# Patient Record
Sex: Female | Born: 1950 | Race: Black or African American | Hispanic: No | Smoking: Never smoker
Health system: Southern US, Community
[De-identification: ages and names within clinical notes are randomized; demographics above are authoritative.]

## PROBLEM LIST (undated history)

## (undated) DIAGNOSIS — T8859XA Other complications of anesthesia, initial encounter: Secondary | ICD-10-CM

## (undated) DIAGNOSIS — T7840XA Allergy, unspecified, initial encounter: Secondary | ICD-10-CM

## (undated) DIAGNOSIS — I10 Essential (primary) hypertension: Secondary | ICD-10-CM

## (undated) DIAGNOSIS — Z8711 Personal history of peptic ulcer disease: Secondary | ICD-10-CM

## (undated) DIAGNOSIS — G709 Myoneural disorder, unspecified: Secondary | ICD-10-CM

## (undated) DIAGNOSIS — I509 Heart failure, unspecified: Secondary | ICD-10-CM

## (undated) DIAGNOSIS — J449 Chronic obstructive pulmonary disease, unspecified: Secondary | ICD-10-CM

## (undated) DIAGNOSIS — I341 Nonrheumatic mitral (valve) prolapse: Secondary | ICD-10-CM

## (undated) DIAGNOSIS — E079 Disorder of thyroid, unspecified: Secondary | ICD-10-CM

## (undated) DIAGNOSIS — M1990 Unspecified osteoarthritis, unspecified site: Secondary | ICD-10-CM

## (undated) DIAGNOSIS — Z8679 Personal history of other diseases of the circulatory system: Secondary | ICD-10-CM

## (undated) DIAGNOSIS — J45909 Unspecified asthma, uncomplicated: Secondary | ICD-10-CM

## (undated) DIAGNOSIS — I639 Cerebral infarction, unspecified: Secondary | ICD-10-CM

## (undated) DIAGNOSIS — I671 Cerebral aneurysm, nonruptured: Secondary | ICD-10-CM

## (undated) DIAGNOSIS — T4145XA Adverse effect of unspecified anesthetic, initial encounter: Secondary | ICD-10-CM

## (undated) DIAGNOSIS — F32A Depression, unspecified: Secondary | ICD-10-CM

## (undated) DIAGNOSIS — M75102 Unspecified rotator cuff tear or rupture of left shoulder, not specified as traumatic: Secondary | ICD-10-CM

## (undated) DIAGNOSIS — F419 Anxiety disorder, unspecified: Secondary | ICD-10-CM

## (undated) DIAGNOSIS — H269 Unspecified cataract: Secondary | ICD-10-CM

## (undated) DIAGNOSIS — Z8719 Personal history of other diseases of the digestive system: Secondary | ICD-10-CM

## (undated) DIAGNOSIS — K219 Gastro-esophageal reflux disease without esophagitis: Secondary | ICD-10-CM

## (undated) DIAGNOSIS — E785 Hyperlipidemia, unspecified: Secondary | ICD-10-CM

## (undated) HISTORY — PX: TYMPANOPLASTY: SHX33

## (undated) HISTORY — DX: Gastro-esophageal reflux disease without esophagitis: K21.9

## (undated) HISTORY — DX: Disorder of thyroid, unspecified: E07.9

## (undated) HISTORY — DX: Allergy, unspecified, initial encounter: T78.40XA

## (undated) HISTORY — DX: Chronic obstructive pulmonary disease, unspecified: J44.9

## (undated) HISTORY — DX: Hyperlipidemia, unspecified: E78.5

## (undated) HISTORY — DX: Unspecified osteoarthritis, unspecified site: M19.90

## (undated) HISTORY — DX: Cerebral infarction, unspecified: I63.9

## (undated) HISTORY — PX: TOTAL KNEE ARTHROPLASTY: SHX125

## (undated) HISTORY — DX: Unspecified cataract: H26.9

## (undated) HISTORY — PX: JOINT REPLACEMENT: SHX530

## (undated) HISTORY — DX: Nonrheumatic mitral (valve) prolapse: I34.1

## (undated) HISTORY — DX: Essential (primary) hypertension: I10

## (undated) HISTORY — DX: Depression, unspecified: F32.A

## (undated) HISTORY — PX: FRACTURE SURGERY: SHX138

## (undated) HISTORY — DX: Anxiety disorder, unspecified: F41.9

## (undated) HISTORY — DX: Heart failure, unspecified: I50.9

## (undated) HISTORY — DX: Myoneural disorder, unspecified: G70.9

---

## 1974-02-08 HISTORY — PX: TUBAL LIGATION: SHX77

## 1985-02-08 HISTORY — PX: VAGINAL HYSTERECTOMY: SUR661

## 1995-02-09 HISTORY — PX: OTHER SURGICAL HISTORY: SHX169

## 1997-08-30 ENCOUNTER — Inpatient Hospital Stay (HOSPITAL_COMMUNITY): Admission: EM | Admit: 1997-08-30 | Discharge: 1997-09-01 | Payer: Self-pay | Admitting: Internal Medicine

## 1999-06-17 ENCOUNTER — Other Ambulatory Visit: Admission: RE | Admit: 1999-06-17 | Discharge: 1999-06-17 | Payer: Self-pay | Admitting: Obstetrics and Gynecology

## 1999-07-08 ENCOUNTER — Ambulatory Visit (HOSPITAL_COMMUNITY): Admission: RE | Admit: 1999-07-08 | Discharge: 1999-07-08 | Payer: Self-pay | Admitting: *Deleted

## 1999-07-10 ENCOUNTER — Encounter: Payer: Self-pay | Admitting: Gastroenterology

## 1999-07-10 ENCOUNTER — Ambulatory Visit (HOSPITAL_COMMUNITY): Admission: RE | Admit: 1999-07-10 | Discharge: 1999-07-10 | Payer: Self-pay | Admitting: Gastroenterology

## 2001-05-12 ENCOUNTER — Emergency Department (HOSPITAL_COMMUNITY): Admission: EM | Admit: 2001-05-12 | Discharge: 2001-05-12 | Payer: Self-pay | Admitting: Emergency Medicine

## 2001-05-14 ENCOUNTER — Emergency Department (HOSPITAL_COMMUNITY): Admission: EM | Admit: 2001-05-14 | Discharge: 2001-05-15 | Payer: Self-pay | Admitting: Emergency Medicine

## 2002-09-12 ENCOUNTER — Emergency Department (HOSPITAL_COMMUNITY): Admission: EM | Admit: 2002-09-12 | Discharge: 2002-09-13 | Payer: Self-pay | Admitting: Emergency Medicine

## 2003-03-18 ENCOUNTER — Emergency Department (HOSPITAL_COMMUNITY): Admission: EM | Admit: 2003-03-18 | Discharge: 2003-03-18 | Payer: Self-pay | Admitting: Emergency Medicine

## 2003-03-22 ENCOUNTER — Encounter: Admission: RE | Admit: 2003-03-22 | Discharge: 2003-03-22 | Payer: Self-pay | Admitting: Internal Medicine

## 2004-02-13 ENCOUNTER — Encounter: Admission: RE | Admit: 2004-02-13 | Discharge: 2004-02-13 | Payer: Self-pay | Admitting: Family Medicine

## 2004-02-19 ENCOUNTER — Encounter (HOSPITAL_BASED_OUTPATIENT_CLINIC_OR_DEPARTMENT_OTHER): Admission: RE | Admit: 2004-02-19 | Discharge: 2004-02-24 | Payer: Self-pay | Admitting: Internal Medicine

## 2004-08-27 ENCOUNTER — Emergency Department (HOSPITAL_COMMUNITY): Admission: EM | Admit: 2004-08-27 | Discharge: 2004-08-28 | Payer: Self-pay | Admitting: Emergency Medicine

## 2004-11-27 ENCOUNTER — Emergency Department (HOSPITAL_COMMUNITY): Admission: EM | Admit: 2004-11-27 | Discharge: 2004-11-28 | Payer: Self-pay | Admitting: Emergency Medicine

## 2004-12-17 ENCOUNTER — Encounter: Admission: RE | Admit: 2004-12-17 | Discharge: 2005-02-22 | Payer: Self-pay | Admitting: Orthopaedic Surgery

## 2005-02-08 HISTORY — PX: BUNIONECTOMY: SHX129

## 2005-03-19 ENCOUNTER — Emergency Department (HOSPITAL_COMMUNITY): Admission: EM | Admit: 2005-03-19 | Discharge: 2005-03-19 | Payer: Self-pay | Admitting: Emergency Medicine

## 2005-03-24 ENCOUNTER — Emergency Department (HOSPITAL_COMMUNITY): Admission: EM | Admit: 2005-03-24 | Discharge: 2005-03-24 | Payer: Self-pay | Admitting: *Deleted

## 2005-09-27 ENCOUNTER — Emergency Department (HOSPITAL_COMMUNITY): Admission: EM | Admit: 2005-09-27 | Discharge: 2005-09-28 | Payer: Self-pay | Admitting: Emergency Medicine

## 2006-06-03 ENCOUNTER — Emergency Department (HOSPITAL_COMMUNITY): Admission: EM | Admit: 2006-06-03 | Discharge: 2006-06-03 | Payer: Self-pay | Admitting: Emergency Medicine

## 2006-07-08 ENCOUNTER — Ambulatory Visit (HOSPITAL_COMMUNITY): Admission: RE | Admit: 2006-07-08 | Discharge: 2006-07-08 | Payer: Self-pay | Admitting: Family Medicine

## 2006-09-12 ENCOUNTER — Inpatient Hospital Stay (HOSPITAL_COMMUNITY): Admission: RE | Admit: 2006-09-12 | Discharge: 2006-09-15 | Payer: Self-pay | Admitting: Orthopedic Surgery

## 2007-11-25 ENCOUNTER — Emergency Department (HOSPITAL_COMMUNITY): Admission: EM | Admit: 2007-11-25 | Discharge: 2007-11-25 | Payer: Self-pay | Admitting: Emergency Medicine

## 2007-11-25 ENCOUNTER — Encounter (INDEPENDENT_AMBULATORY_CARE_PROVIDER_SITE_OTHER): Payer: Self-pay | Admitting: Emergency Medicine

## 2007-11-26 ENCOUNTER — Ambulatory Visit: Payer: Self-pay | Admitting: Vascular Surgery

## 2008-07-07 ENCOUNTER — Emergency Department (HOSPITAL_COMMUNITY): Admission: EM | Admit: 2008-07-07 | Discharge: 2008-07-07 | Payer: Self-pay | Admitting: Emergency Medicine

## 2009-03-14 ENCOUNTER — Emergency Department (HOSPITAL_COMMUNITY): Admission: EM | Admit: 2009-03-14 | Discharge: 2009-03-14 | Payer: Self-pay | Admitting: Emergency Medicine

## 2009-11-01 ENCOUNTER — Emergency Department (HOSPITAL_COMMUNITY)
Admission: EM | Admit: 2009-11-01 | Discharge: 2009-11-01 | Payer: Self-pay | Source: Home / Self Care | Admitting: Emergency Medicine

## 2009-11-07 ENCOUNTER — Encounter: Admission: RE | Admit: 2009-11-07 | Discharge: 2009-11-07 | Payer: Self-pay | Admitting: Orthopedic Surgery

## 2010-03-01 ENCOUNTER — Encounter: Payer: Self-pay | Admitting: Family Medicine

## 2010-04-23 LAB — POCT CARDIAC MARKERS
CKMB, poc: <1 ng/mL — ABNORMAL LOW (ref 1.0–8.0)
Myoglobin, poc: 54.5 ng/mL (ref 12–200)
Troponin i, poc: <0.05 ng/mL (ref 0.00–0.09)

## 2010-04-23 LAB — URINALYSIS, ROUTINE W REFLEX MICROSCOPIC
Bilirubin Urine: NEGATIVE
Glucose, UA: >1000 mg/dL — AB
Hgb urine dipstick: NEGATIVE
Ketones, ur: NEGATIVE mg/dL
Leukocytes, UA: NEGATIVE
Nitrite: NEGATIVE
Protein, ur: NEGATIVE mg/dL
Specific Gravity, Urine: 1.029 (ref 1.005–1.030)
Urobilinogen, UA: 0.2 mg/dL (ref 0.0–1.0)
pH: 5.5 (ref 5.0–8.0)

## 2010-04-23 LAB — POCT I-STAT, CHEM 8
BUN: 12 mg/dL (ref 6–23)
Calcium, Ion: 1.16 mmol/L (ref 1.12–1.32)
Chloride: 104 mEq/L (ref 96–112)
Creatinine, Ser: 0.7 mg/dL (ref 0.4–1.2)
Glucose, Bld: 246 mg/dL — ABNORMAL HIGH (ref 70–99)
HCT: 46 % (ref 36.0–46.0)
Hemoglobin: 15.6 g/dL — ABNORMAL HIGH (ref 12.0–15.0)
Potassium: 3.8 mEq/L (ref 3.5–5.1)
Sodium: 141 mEq/L (ref 135–145)
TCO2: 29 mmol/L (ref 0–100)

## 2010-04-23 LAB — URINE CULTURE
Colony Count: 7000
Culture  Setup Time: 201109251511

## 2010-04-23 LAB — URINE MICROSCOPIC-ADD ON

## 2010-05-19 LAB — CBC
HCT: 42.4 % (ref 36.0–46.0)
Hemoglobin: 14.1 g/dL (ref 12.0–15.0)
MCHC: 33.4 g/dL (ref 30.0–36.0)
MCV: 86.5 fL (ref 78.0–100.0)
Platelets: 248 10*3/uL (ref 150–400)
RBC: 4.9 MIL/uL (ref 3.87–5.11)
RDW: 13.4 % (ref 11.5–15.5)
WBC: 6.9 10*3/uL (ref 4.0–10.5)

## 2010-05-19 LAB — BASIC METABOLIC PANEL
BUN: 16 mg/dL (ref 6–23)
CO2: 29 mEq/L (ref 19–32)
Calcium: 9.4 mg/dL (ref 8.4–10.5)
Chloride: 104 mEq/L (ref 96–112)
Creatinine, Ser: 0.49 mg/dL (ref 0.4–1.2)
GFR calc Af Amer: >60 mL/min (ref >60)
GFR calc non Af Amer: >60 mL/min (ref >60)
Glucose, Bld: 164 mg/dL — ABNORMAL HIGH (ref 70–99)
Potassium: 3.7 mEq/L (ref 3.5–5.1)
Sodium: 141 mEq/L (ref 135–145)

## 2010-05-19 LAB — URINALYSIS, ROUTINE W REFLEX MICROSCOPIC
Bilirubin Urine: NEGATIVE
Glucose, UA: 100 mg/dL — AB
Hgb urine dipstick: NEGATIVE
Ketones, ur: NEGATIVE mg/dL
Nitrite: NEGATIVE
Protein, ur: NEGATIVE mg/dL
Specific Gravity, Urine: 1.023 (ref 1.005–1.030)
Urobilinogen, UA: 0.2 mg/dL (ref 0.0–1.0)
pH: 6 (ref 5.0–8.0)

## 2010-05-19 LAB — CK TOTAL AND CKMB (NOT AT ARMC)
CK, MB: 3 ng/mL (ref 0.3–4.0)
Relative Index: 2 (ref 0.0–2.5)
Total CK: 153 U/L (ref 7–177)

## 2010-05-19 LAB — DIFFERENTIAL
Basophils Absolute: 0 10*3/uL (ref 0.0–0.1)
Basophils Relative: 1 % (ref 0–1)
Eosinophils Absolute: 0.2 10*3/uL (ref 0.0–0.7)
Eosinophils Relative: 3 % (ref 0–5)
Lymphocytes Relative: 43 % (ref 12–46)
Lymphs Abs: 2.9 10*3/uL (ref 0.7–4.0)
Monocytes Absolute: 0.4 10*3/uL (ref 0.1–1.0)
Monocytes Relative: 6 % (ref 3–12)
Neutro Abs: 3.3 10*3/uL (ref 1.7–7.7)
Neutrophils Relative %: 48 % (ref 43–77)

## 2010-05-19 LAB — TROPONIN I: Troponin I: 0.01 ng/mL (ref 0.00–0.06)

## 2010-05-19 LAB — BRAIN NATRIURETIC PEPTIDE: Pro B Natriuretic peptide (BNP): <30 pg/mL (ref 0.0–100.0)

## 2010-06-23 NOTE — Op Note (Signed)
NAME:  Sherry [name], Sherry [name]            ACCOUNT NO.:  [id number]   MEDICAL RECORD NO.:  [id number]          PATIENT TYPE:  INP   LOCATION:  0001                         FACILITY:  [facility]   PHYSICIAN:  Madlyn Frankel. Charlann Boxer, M.D.  DATE OF BIRTH:  11-25-1950   DATE OF PROCEDURE:  09/12/2006  DATE OF DISCHARGE:                               OPERATIVE REPORT   PREOPERATIVE DIAGNOSIS:  Right knee osteoarthritis.   POSTOPERATIVE DIAGNOSIS:  Right knee osteoarthritis.   PROCEDURE:  Right total knee replacement.   COMPONENTS USED:  DePuy rotating platform knee system with size 2 femur,  2 tibia, 12.5-mm insert, 35 patellar button.   SURGEON:  Madlyn Frankel. Charlann Boxer, M.D.   ASSISTANT:  Dwyane Luo, PA-C   ANESTHESIA:  General.   BLOOD LOSS:  Minimal.   TOURNIQUET TIME:  Femoral 42 minutes at 300 mmHg.   DRAINS:  None.   COMPLICATIONS:  None.   INDICATIONS FOR PROCEDURE:  Sherry [name] is a 60 year old female who  presented to the office for evaluation of right knee pain.  At the time  of presentation she was already on narcotics and had persistent  discomfort.  I reviewed with her that based on narcotic use there was  probably very little nonoperative intervention that could help, that she  had failed conservative measures.  Consent was obtained after reviewing  risks of infection, DVT, dislocation of the component, component  failure, stiffness, postoperative course were all reviewed including the  extensive therapy that was required including the pain that was  associated with it.   PROCEDURE IN DETAIL:  The patient was brought into the operative  theater.  Once adequate anesthesia, preoperative antibiotics, Ancef were  administered, the patient was positioned supine.  Attempted spinal  anesthesia was unsuccessful and therefore went to sleep.  They did not  feel femoral nerve block would be of benefit as well.  The right lower  extremity was placed into a proximal thigh tourniquet, prescrubbed  and  prepped and draped in sterile fashion.   A midline incision was made.  The patient's soft tissue was noted to be  very fibrotic.  She is a diabetic patient.  Standard incision was made  followed by modified medial parapatellar arthrotomy with patella  subluxation rather than eversion.  Attention to debridement initially in  the joint including synovectomy was carried out.  Attention was first  directed to the patella.  A precut measurement was at the 22 mm.  I  resected down to 13 mm and placed a 35 patellar button.   Attention was then directed to the femur.  Femoral canal was opened,  irrigated to prevent fat emboli, then using an intramedullary device at  5 degrees of valgus I resected 10 mm off the distal side.  After  removing osteophytes off the distal medial tibia/femur.  I then sized  the femur to be a size 2.  I placed the cutting jig which was  perpendicular to [employer] and referenced off of the posterior  condylar axis.   The anterior, posterior and chamfer cuts were then made without notching  or complication.  I then made the trochlear box cut based on the lateral  aspect of distal femur.  This was done without complication despite  being a size 2 femur.  Attention was then directed the tibia.  I  resected 10 mm of bone off the lateral side.   Meniscectomies and cruciate stumps debrided.  I sized the tibia to be a  size 2.  I did check with a spacer block and found the knee came out to  extension with a 10-mm block, probably requiring a 12.5.  I went ahead  and set the tibial rotation pendant, checked with an alignment rod and  was happy that in both the coronal and sagittal planes of the tibia the  alignment rod passed through the ankle.   At this point it was drilled and keel punch.  Trial reduction was  carried out with 2 femur, 2 tibia, 12.5 insert.  The knee came out to  full extension with a 35 patellar button in place.  The patella tracked  without  application of any pressure and the knee was stable from  extension into flexion.  At this point all trial components were  removed.  The knee was irrigated with normal saline solution.  Final  components were opened.  The knee was injected 60 mL of 0.25% Marcaine  with epinephrine.  Once the components were ready, the cement was mixed  and the components were cemented into position.  The knee was brought to  extension with a 12.5 trial insert.  Extruded cement was removed.  Once  the cement had cured, I brought the knee to flexion and removed  excessive cement.  Once I was satisfied there was no visualized cement  throughout the knee, the final 12.5 x 2 insert was placed.   The knee was again irrigated with 5 mL.  FloSeal was then injected the  medial and lateral gutters.  The knee was then brought to flexion  without a Hemovac placed, then the extensor mechanism reapproximated  using #1 Vicryl.  The remainder of the wound was closed with 2-0 Vicryl  and a running 4-0 Monocryl.  The patient's knee was then cleaned, dried  and dressed sterilely with Steri-Strips and bulky sterile dressing.  She  was brought to recovery in stable condition.   She is going to be placed on PCA oxycodone postoperative.      Madlyn Frankel Charlann Boxer, M.D.  Electronically Signed     MDO/MEDQ  D:  09/12/2006  T:  09/12/2006  Job:  [id number]

## 2010-06-23 NOTE — H&P (Signed)
NAME:  RHANDA, LEMIRE            ACCOUNT NO.:  [id number]   MEDICAL RECORD NO.:  [id number]          PATIENT TYPE:  INP   LOCATION:  NA                           FACILITY:  [facility]   PHYSICIAN:  Madlyn Frankel. Charlann Boxer, M.D.  DATE OF BIRTH:  November 04, 1950   DATE OF ADMISSION:  09/12/2006  DATE OF DISCHARGE:                              HISTORY & PHYSICAL   PROCEDURE:  Right total knee arthroplasty.   CHIEF COMPLAINT:  Right knee pain.   HISTORY OF PRESENT ILLNESS:  This is a 60 year old female with a history  of persistent progressive right knee pain secondary to osteoarthritis.  It has been refractory to all conservative treatment.  She has been  presurgically assessed by Dr. Leilani Able.   PAST MEDICAL HISTORY:  Significant for  1. Osteoarthritis.  2. Degenerative disk disease.  3. Hypertension.  4. Asthma.  5. Reflux disease.  6. Fibrocystic breast disease.  7. Diabetes, insulin dependent.   PAST SURGICAL HISTORY:  Includes  1. Tympanoplasty in 1980 and 1990.  2. Partial hysterectomy.  3. Left shoulder repair in 1997.  4. Left bunionectomy in 2007.  Of note, in previous surgeries, anesthesia, some medicine precipitated a  hypotensive crisis of sorts in 1980 or 1990.  The patient unable to  remember.   SOCIAL HISTORY:  The patient is married.  Primary caregiver after  surgery will be husband, Broderick.   FAMILY HISTORY:  Heart disease, hypertension, diabetes, cancer,  arthritis.   DRUG ALLERGIES:  CODEINE causes itching.  DEMEROL causes seizures.  NONSTEROIDAL ANTI-INFLAMMATORY DRUGS can cause palpitations.   MEDICATIONS:  Include  1. Protonix 40 mg p.o. daily.  2. Singulair 10 mg p.o. daily.  3. Colace 100 mg p.o. b.i.d.  4. Oxycodone 5 mg one to two p.o. q.6h.  p.r.n. pain.  5. Unknown blood pressure medicine recently prescribed.  6. Celebrex 200 mg one p.o. b.i.d.   REVIEW OF SYSTEMS:  NEUROLOGY:  Remote history of seizures in 1970.  GASTROINTESTINAL:  Hiatal hernia  and diverticulitis.  GENITOURINARY:  Recurring urinary tract infections, cystitis and some urinary  incontinence.  GYNECOLOGIC:  History of uterine fibroids with  hysterectomy.  Otherwise see HPI.   PHYSICAL EXAMINATION:  VITAL SIGNS:  Pulse 68, respirations 18, blood  pressure 122/84.  GENERAL:  Awake, alert and oriented, well-developed, well-nourished in  no acute distress.  NECK:  Supple.  No carotid bruits.  CHEST/LUNGS:  Clear to auscultation bilaterally.  BREASTS:  Deferred.  HEART:  Regular rate and rhythm without gallops, clicks, rubs or  murmurs.  ABDOMEN:  Soft, nontender, nondistended.  Bowel sounds present in all  four quadrants.  GENITOURINARY:  Deferred.  EXTREMITIES:  Right knee increased pain with movement.  Dorsalis pedis  pulse positive.  SKIN:  No cellulitis.  Does have keloid scars on left foot and is prone  to keloid scars.  NEUROLOGIC:  Intact distal sensibilities.   LABORATORY DATA:  Labs, EKG, chest x-ray all pending.   IMPRESSION:  1. Osteoarthritis.  2. Gastroesophageal reflux disease.  3. Meniere's disease.  4. Remote history of seizures.  5. Asthma.  6. Hypertension.  7. Diabetes.   PLAN OF ACTION:  Right total knee arthroplasty September 12, 2006, [facility], Dr. Durene Romans.  Risks and complications were  discussed.  Questions were encouraged, answered reviewed.   Postoperative medications including Lovenox, Robaxin, iron, aspirin,  Colace and MiraLax provided at time of presurgical history and physical.  Also Celebrex 200 mg tablets were given for perioperative use.  Will  continue to use 200 mg p.o. b.i.d. two days after surgery.     ______________________________  Yetta Glassman [address], [place]      Madlyn Frankel. Charlann Boxer, M.D.  Electronically Signed   BLM/MEDQ  D:  09/05/2006  T:  09/06/2006  Job:  [id number]   cc:   Jocelyn Lamer D. Pecola Leisure, M.D.  Fax: [phone]

## 2010-06-26 NOTE — Consult Note (Signed)
NAMESHARALEE, WITMAN NO.:  [id number]   MEDICAL RECORD NO.:  [id number]          PATIENT TYPE:  REC   LOCATION:  FOOT                         FACILITY:  [facility]   PHYSICIAN:  Jonelle Sports. Sevier, M.D. DATE OF BIRTH:  1950/06/30   DATE OF CONSULTATION:  DATE OF DISCHARGE:                                   CONSULTATION   Audio too short to transcribe (less than 5 seconds)       RES/MEDQ  D:  02/20/2004  T:  02/20/2004  Job:  [id number]

## 2010-06-26 NOTE — Procedures (Signed)
[facility]  Patient:    Sherry [name], SAFRAN                     MRN: [id number] Proc. Date: 07/08/99 Adm. Date:  [id number] Disc. Date: [id number] Attending:  Charna Elizabeth CC:         Lilly Cove, M.D.                           Procedure Report  DATE OF BIRTH:  [birth year].  REFERRING PHYSICIAN:  Lilly Cove, M.D.  PROCEDURE:  Esophagogastroduodenoscopy.  ENDOSCOPIST:  Anselmo Rod, M.D.  INSTRUMENTS:  Olympus video panendoscope.  INDICATION:  Severe epigastric pain and reflux with rectal bleeding in a 60 year old black female.  Rule out peptic ulcer disease, esophagitis, gastritis, etc.  INFORMED CONSENT:  Informed consent was procured from the patient.  PREPARATION:  The patient was fasted for eight hours prior to the procedure.  PHYSICAL EXAMINATION:  VITAL SIGNS:  Stable.  NECK:  Supple.  CHEST:  Clear to auscultation.  HEART:  S1 and S2 regular.  ABDOMEN:  Soft with normal abdominal bowel sounds.  Epigastric tenderness on palpation with guarding.  No rebound or rigidity.  No hepatosplenomegaly.  DESCRIPTION OF PROCEDURE:  The patient was placed in the left lateral decubitus position and sedated with 50 mg of Demerol and 5 mg of Versed intravenously.  Once the patient was adequately sedated, maintained on low flow oxygen, and continuous cardiac monitoring, the Olympus video panendoscope was advanced through the mouthpiece, over the tongue, into the esophagus under direct vision.  The entire esophagus appeared normal without evidence of ring, stricture, mass, lesions, or esophagitis.  The scope was then advanced to the stomach.  There was active reflux of the gastric content into the distal esophagus from the stomach.  A hiatal hernia was seen on retroflexion.  No abnormalities were recognized.  The gastric mucosa appeared healthy.  So did the proximal small bowel.  IMPRESSION:  Small hiatal hernia.  Otherwise  normal esophagogastroduodenoscopy.  RECOMMENDATIONS:  Proceed with colonoscopy at this time.  Continue PPI.  Avoid all nonsteroidals.  Further recommendations will be made once the colonoscopy has been done. DD:  07/08/99 TD:  07/10/99 Job: [id number] UXL/[id number]

## 2010-06-26 NOTE — Consult Note (Signed)
Sherry [name], Sherry [name] NO.:  [id number]   MEDICAL RECORD NO.:  [id number]          PATIENT TYPE:  REC   LOCATION:  FOOT                         FACILITY:  [facility]   PHYSICIAN:  Jonelle Sports. Cheryll Cockayne, M.D. DATE OF BIRTH:  [birth year]   DATE OF CONSULTATION:  02/20/2004  DATE OF DISCHARGE:                                   CONSULTATION   REFERRING PHYSICIAN:  Lavonda Jumbo, M.D.   HISTORY:  This 60 year old black female was seen at the courtesy of Dr.  Lynelle Doctor for evaluation of multiple calluses in conjunction with diabetes.   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  She wears tennis-type shoes at work, but does experience some  foot discomfort and mild swelling, particularly by the end of the day.  She  has noted some callus formation throughout her feet and being aware of the  potential hazards with diabetes is here today for our evaluation and advice.   PAST MEDICAL HISTORY:  1.  Diverticulitis.  2.  Meniere's disease.  3.  Gastrointestinal reflux.  4.  Mitral valve prolapse.  5.  Some arthritis of the back and knees.   ALLERGIES:  She is said to be allergic to CODEINE and also GENERAL  ANESTHESIA which causes major hypotension.   MEDICATIONS:  Her regular medications include Lantus insulin, Hygroton,  Flexeril, Protonix, Nexium, Peri-Colace and Humibid.   PHYSICAL EXAMINATION:  Examination today is limited to the distal lower  extremities.  The feet are without gross deformity, although there is  beginning hallux valgus formation on the right foot and slight overriding of  the second toe on the hallux.  She has minimal clawing of her toes.  There  is some slight loss of the longitudinal arches bilaterally as well.  Skin  temperatures are normal and symmetrical.  All pulses are palpable and quite  adequate.  Monofilament testing shows that she has preservation of  protective sensation throughout both feet.   There  is no evidence of edema at the time of this examination.   The patient has callus formation at the distal interphalangeal joint of the  right second toe where it overrides the right hallux.  She also has  significant callus formation at the interphalangeal joint areas medially of  both halluces, as well as calluses on the plantar aspects of the feet at the  second, third and fourth metatarsal head areas on the right and at the  fourth and fifth metatarsal head areas on the left.   DISPOSITION:  1.  The patient is given instruction regarding foot care and diabetes by      video with nurse and physician reinforcement.  2.  The aforementioned calluses to include that on the medial interdigital      portion of the right second toe and those on the plantar aspects of both      feet are sharply pared without incident.  This paring includes calluses      at the second, third and fourth metatarsal head areas on the right  and      at the fourth and fifth metatarsal areas on the left.  3.  It is discussed with the patient that she might look into getting a      bunion splint to be worn overnight to try to prevent further worsening      of the hallux valgus there.  She is given a __________ pad to wear on      the hallux which will provide some cushioning where the second toe      overrides.  She is advised to get cocoa butter to use on her feet for      the generally dry skin.  4.  The matter of shoe fit is discussed with the patient in that there is      some evidence that her current shoes are too short.  Finally, she is      advised to get gel-type over the counter inserts with some longitudinal      arch buildup as well and to use these to prevent foot fatigue and      further progression of her disease.  5.  She is advised to let us see her again on a six-month basis to assess      for persistent in advance of her foot problems.       RES/MEDQ  D:  02/20/2004  T:  02/20/2004  Job:   [id number]   cc:   Lavonda Jumbo, M.D.  [address] [place], [place] [address]  Fax: [phone]

## 2010-06-26 NOTE — Discharge Summary (Signed)
NAME:  Sherry, [name] NO.:  [id number]   MEDICAL RECORD NO.:  [id number]          PATIENT TYPE:  INP   LOCATION:  1608                         FACILITY:  [facility]   PHYSICIAN:  Madlyn Frankel. Charlann Boxer, M.D.  DATE OF BIRTH:  Mar 14, 1950   DATE OF ADMISSION:  09/12/2006  DATE OF DISCHARGE:  09/15/2006                               DISCHARGE SUMMARY   ADMISSION DIAGNOSES:  1. Osteoporosis.  2. Degenerative disk disease.  3. Hypertension.  4. Asthma.  5. Reflux.  6. Fibrocystic breast disease.  7. Diabetes, insulin dependent.   DISCHARGE DIAGNOSES:  1. Osteoarthritis.  2. Degenerative disk disease.  3,  Hypertension.  1. Asthma.  2. Reflux disease.  3. Fibrocystic breast disease.  4. Diabetes.  5. Postoperative hypokalemia.   CONSULTATIONS:  None.   PROCEDURE:  Right total knee arthroplasty.   SURGEON:  Madlyn Frankel. Charlann Boxer, M.D.   ASSISTANT:  Yetta Glassman. Mann, P.A.   LABORATORY DATA:  Admission CBC:  Hematocrit 39.9, postoperative  interval one 32.6, postoperativeinterval two 30.2, stable.  Coags  negative.  Routine chemistry:  Glucose 171 preoperative, all other  normal.  Postoperative day #1 all within normal limits.  At discharge  mild postoperative hypokalemia 3.3, glucose at 143, BUN at 3.  Kidney  function within normal limits.  Calcium at discharge 8.1.  GI negative.  UA negative.   Cardiology:  EKG:  Normal sinus rhythm.   Radiology shows two view, no active cardiopulmonary disease.   HISTORY OF PRESENT ILLNESS:  This is a 60 year old female with a history  of persistent progressive right knee pain secondary to osteoarthritis,  refractory to conservative treatment.  Presurgically assessed by Dr.  Leilani Able.   HOSPITAL COURSE:  The patient underwent right total knee replacement by  surgeon Dr. Durene Romans.  She tolerated the procedure well and was  admitted to the orthopedic floor.  She remained afebrile throughout her  course.  The patient remained  neurovascularly intact of her right lower  extremity as well.  Pain was well-controlled.  Dressing was changed on  postoperative day #1 and a daily basis with no significant wound  drainage.  Pain was well-controlled PCA.  On postoperative day #1 she  was switched to oral medications afterwards.  Physical therapy was begun  on postoperative day #1.  She progressed nicely during her course of  stay and was able to ambulate at least 50 feet with the use of a rolling  walker, weight bearing as tolerated after surgery.  DVT prophylaxis was  begun as well.  Mild hypokalemia was replaced with K-Dur.  Home health,  physical therapy, as well as Lovenox teaching provided on postoperative  day #3.  The patient was stable and ready for discharge.   DISPOSITION:  Discharged to home with home health care PT.  Stable and  improved condition with weight bearing as tolerated with the use of 3-in-  1 walker.   Discharge physical therapy:  Weight bearing as tolerated with rolling  walker for two weeks and transition to single point cane.  Goals for  physical therapy would be to minimize  pain, maximize strength, and  increase range of motion, and encourage independence in activities of  daily living.  Also work on Water quality scientist.   DIET:  No restrictions.   DISCHARGE MEDICATIONS:  1. Lovenox 30 mg subcu q.12 x11 days.  2. Enteric-coated aspirin 325 mg p.o. daily, four weeks after Lovenox      completed.  3. Robaxin 500 mg p.o. q.6 p.r.n. muscle spasm.  4. Iron 325 mg p.o. t.i.d. x2 weeks.  5. Colace 100 mg p.o. b.i.d.  6. Miralax 17 gm  p.o. daily.  7. Vicodin 5/325 1-2 p.o. q.4-6 p.r.n. pain.  8. Niacin 500 mg p.o. daily.  9. __________ 10/160 mg one p.o. q.a.m.  10.Lantus 45 units q.h.s.  11.Calcium plus D one daily.  12.Senokot two at night.  13.Protonix 40 mg q.a.m.  14.Singulair 10 mg q.a.m.  15.Xanax p.r.n.  16.Prilosec 20 mg p.o. q.a.m.   FOLLOWUP:  Follow with Dr. Charlann Boxer,  [phone], in two weeks for wound care  check.     ______________________________  Yetta Glassman. [address], [place]      Madlyn Frankel. Charlann Boxer, M.D.  Electronically Signed   BLM/MEDQ  D:  10/03/2006  T:  10/04/2006  Job:  [id number]

## 2010-06-26 NOTE — Procedures (Signed)
[facility]  Patient:    Sherry [name], Sherry [name]                     MRN: [id number] Proc. Date: 07/08/99 Adm. Date:  [id number] Disc. Date: [id number] Attending:  Charna Elizabeth CC:         Lilly Cove, M.D.                           Procedure Report  DATE OF BIRTH:  November 13, 1950.  REFERRING PHYSICIAN:  Lilly Cove, M.D.  PROCEDURE:  Colonoscopy.  ENDOSCOPIST:  Anselmo Rod, M.D.  INSTRUMENTS:  Olympus video colonoscope.  INDICATION:  Rectal bleeding in a 60 year old black female.  Rule out polyps, masses, hemorrhoids, etc.  INFORMED CONSENT:  Informed consent was procured from the patient.  PREPARATION:  The patient was fasted for eight hours prior to the procedure and prepped with a bottle of magnesium citrate and a gallon of NuLYTELY the night prior to the procedure.  PHYSICAL EXAMINATION:  VITAL SIGNS:  Stable.  NECK:  Supple.  CHEST:  Clear to auscultation.  HEART:  S1 and S2 regular.  ABDOMEN:  Soft with normal abdominal bowel sounds.  DESCRIPTION OF PROCEDURE:  The patient was placed in the left lateral decubitus position and sedated with an additional 25 mg of Demerol and 1 mg o Versed intravenously.  Once the patient was adequately sedated, maintained on low flow oxygen, and continuous cardiac monitoring, the Olympus video colonoscope was advanced from the rectum to the cecum without difficulty.  Except for small internal hemorrhoids, no other abnormalities were seen.  The patient tolerated the procedure well without complication.  IMPRESSION:  Normal colonoscopy except for small nonbleeding internal hemorrhoids.  RECOMMENDATIONS:  The patient has been advised to increase her fluid and fiber in her diet.  Colace 100-200 mg to be tried at bed time for stool softener. Outpatient follow up is advised in the next two weeks. DD:  07/08/99 TD:  07/10/99 Job: [id number]

## 2010-11-09 LAB — DIFFERENTIAL
Basophils Absolute: 0.1
Basophils Relative: 1
Eosinophils Absolute: 0.3
Eosinophils Relative: 4
Lymphocytes Relative: 39
Lymphs Abs: 3
Monocytes Absolute: 0.3
Monocytes Relative: 3
Neutro Abs: 4.1
Neutrophils Relative %: 53

## 2010-11-09 LAB — BASIC METABOLIC PANEL
BUN: 9
CO2: 29
Calcium: 9
Chloride: 105
Creatinine, Ser: 0.65
GFR calc Af Amer: >60
GFR calc non Af Amer: >60
Glucose, Bld: 196 — ABNORMAL HIGH
Potassium: 3.2 — ABNORMAL LOW
Sodium: 140

## 2010-11-09 LAB — GLUCOSE, CAPILLARY: Glucose-Capillary: 182 — ABNORMAL HIGH

## 2010-11-09 LAB — CBC
HCT: 43.2
Hemoglobin: 14.3
MCHC: 33.1
MCV: 88.1
Platelets: 277
RBC: 4.91
RDW: 14.1
WBC: 7.7

## 2010-11-09 LAB — D-DIMER, QUANTITATIVE: D-Dimer, Quant: 0.99 — ABNORMAL HIGH

## 2010-11-23 LAB — URINALYSIS, ROUTINE W REFLEX MICROSCOPIC
Bilirubin Urine: NEGATIVE
Glucose, UA: NEGATIVE
Hgb urine dipstick: NEGATIVE
Ketones, ur: NEGATIVE
Nitrite: NEGATIVE
Protein, ur: NEGATIVE
Specific Gravity, Urine: 1.021
Urobilinogen, UA: 0.2
pH: 7

## 2010-11-23 LAB — COMPREHENSIVE METABOLIC PANEL
ALT: 16
AST: 15
Albumin: 3.6
Alkaline Phosphatase: 67
BUN: 8
CO2: 30
Calcium: 9.2
Chloride: 105
Creatinine, Ser: 0.57
GFR calc Af Amer: >60
GFR calc non Af Amer: >60
Glucose, Bld: 171 — ABNORMAL HIGH
Potassium: 4
Sodium: 141
Total Bilirubin: 0.9
Total Protein: 6.6

## 2010-11-23 LAB — CBC
HCT: 30.2 — ABNORMAL LOW
HCT: 33.6 — ABNORMAL LOW
HCT: 39.9
Hemoglobin: 10.4 — ABNORMAL LOW
Hemoglobin: 11.7 — ABNORMAL LOW
Hemoglobin: 13.7
MCHC: 34.6
MCHC: 34.7
MCHC: 34.4
MCV: 86.3
MCV: 86.4
MCV: 86.3
Platelets: 207
Platelets: 240
Platelets: 300
RBC: 3.49 — ABNORMAL LOW
RBC: 3.9
RBC: 4.62
RDW: 13.8
RDW: 14
RDW: 14.1 — ABNORMAL HIGH
WBC: 10.2
WBC: 9.8
WBC: 6

## 2010-11-23 LAB — BASIC METABOLIC PANEL
BUN: 3 — ABNORMAL LOW
BUN: 7
CO2: 28
CO2: 29
Calcium: 8.1 — ABNORMAL LOW
Calcium: 8.2 — ABNORMAL LOW
Chloride: 104
Chloride: 106
Creatinine, Ser: 0.54
Creatinine, Ser: 0.54
GFR calc Af Amer: >60
GFR calc Af Amer: >60
GFR calc non Af Amer: >60
GFR calc non Af Amer: >60
Glucose, Bld: 143 — ABNORMAL HIGH
Glucose, Bld: 90
Potassium: 3.3 — ABNORMAL LOW
Potassium: 3.6
Sodium: 138
Sodium: 139

## 2010-11-23 LAB — ABO/RH: ABO/RH(D): O POS

## 2010-11-23 LAB — PROTIME-INR
INR: 1
Prothrombin Time: 13

## 2010-11-23 LAB — TYPE AND SCREEN
ABO/RH(D): O POS
Antibody Screen: NEGATIVE

## 2010-11-23 LAB — APTT: aPTT: 29

## 2011-09-07 ENCOUNTER — Other Ambulatory Visit: Payer: Self-pay | Admitting: Specialist

## 2011-09-14 ENCOUNTER — Encounter (HOSPITAL_BASED_OUTPATIENT_CLINIC_OR_DEPARTMENT_OTHER): Payer: Self-pay | Admitting: *Deleted

## 2011-09-16 ENCOUNTER — Encounter (HOSPITAL_BASED_OUTPATIENT_CLINIC_OR_DEPARTMENT_OTHER): Payer: Self-pay | Admitting: *Deleted

## 2011-09-16 NOTE — Progress Notes (Signed)
NPO AFTER MN WITH EXCEPTION CLEAR LIQUIDS UNTIL 0700 (NO MILK/ CREAM PRODUCTS). ARRIVES AT 1100. NEEDS ISTAT AND EKG. WILL TAKE PRILOSEC AND METOPROLOL AM OF SURG W/ SIP OF WATER.  PT UNDERSTANDS OWER AT MAIN.

## 2011-09-17 NOTE — Progress Notes (Signed)
Spoke with Carollee Herter in Dr Veronda Prude office regarding his pre-op orders-new orders received.

## 2011-09-20 ENCOUNTER — Encounter (HOSPITAL_BASED_OUTPATIENT_CLINIC_OR_DEPARTMENT_OTHER): Payer: Self-pay | Admitting: Anesthesiology

## 2011-09-20 ENCOUNTER — Encounter (HOSPITAL_BASED_OUTPATIENT_CLINIC_OR_DEPARTMENT_OTHER): Payer: Self-pay | Admitting: *Deleted

## 2011-09-20 ENCOUNTER — Ambulatory Visit (HOSPITAL_BASED_OUTPATIENT_CLINIC_OR_DEPARTMENT_OTHER): Payer: Worker's Compensation | Admitting: Anesthesiology

## 2011-09-20 ENCOUNTER — Encounter (HOSPITAL_COMMUNITY)
Admission: RE | Disposition: A | Discharge status: Home / Self Care | Payer: Self-pay | Source: Ambulatory Visit | Attending: Specialist

## 2011-09-20 ENCOUNTER — Ambulatory Visit (HOSPITAL_BASED_OUTPATIENT_CLINIC_OR_DEPARTMENT_OTHER)
Admission: RE | Admit: 2011-09-20 | Discharge: 2011-09-21 | Disposition: A | Discharge status: Home / Self Care | Payer: Worker's Compensation | Source: Ambulatory Visit | Attending: Specialist | Admitting: Specialist

## 2011-09-20 DIAGNOSIS — S43429A Sprain of unspecified rotator cuff capsule, initial encounter: Secondary | ICD-10-CM | POA: Insufficient documentation

## 2011-09-20 DIAGNOSIS — I10 Essential (primary) hypertension: Secondary | ICD-10-CM | POA: Insufficient documentation

## 2011-09-20 DIAGNOSIS — E785 Hyperlipidemia, unspecified: Secondary | ICD-10-CM | POA: Insufficient documentation

## 2011-09-20 DIAGNOSIS — E119 Type 2 diabetes mellitus without complications: Secondary | ICD-10-CM | POA: Insufficient documentation

## 2011-09-20 DIAGNOSIS — K219 Gastro-esophageal reflux disease without esophagitis: Secondary | ICD-10-CM | POA: Insufficient documentation

## 2011-09-20 DIAGNOSIS — M25819 Other specified joint disorders, unspecified shoulder: Secondary | ICD-10-CM | POA: Insufficient documentation

## 2011-09-20 DIAGNOSIS — M75100 Unspecified rotator cuff tear or rupture of unspecified shoulder, not specified as traumatic: Secondary | ICD-10-CM

## 2011-09-20 DIAGNOSIS — Z79899 Other long term (current) drug therapy: Secondary | ICD-10-CM | POA: Insufficient documentation

## 2011-09-20 DIAGNOSIS — X58XXXA Exposure to other specified factors, initial encounter: Secondary | ICD-10-CM | POA: Insufficient documentation

## 2011-09-20 DIAGNOSIS — Z794 Long term (current) use of insulin: Secondary | ICD-10-CM | POA: Insufficient documentation

## 2011-09-20 HISTORY — DX: Unspecified osteoarthritis, unspecified site: M19.90

## 2011-09-20 HISTORY — DX: Personal history of other diseases of the digestive system: Z87.19

## 2011-09-20 HISTORY — PX: SHOULDER ARTHROSCOPY: SHX128

## 2011-09-20 HISTORY — DX: Unspecified rotator cuff tear or rupture of left shoulder, not specified as traumatic: M75.102

## 2011-09-20 HISTORY — DX: Other complications of anesthesia, initial encounter: T88.59XA

## 2011-09-20 HISTORY — DX: Personal history of other diseases of the circulatory system: Z86.79

## 2011-09-20 HISTORY — DX: Personal history of peptic ulcer disease: Z87.11

## 2011-09-20 HISTORY — DX: Adverse effect of unspecified anesthetic, initial encounter: T41.45XA

## 2011-09-20 LAB — POCT I-STAT, CHEM 8
BUN: 14 mg/dL (ref 6–23)
Calcium, Ion: 1.24 mmol/L (ref 1.13–1.30)
Chloride: 104 mEq/L (ref 96–112)
Creatinine, Ser: 0.8 mg/dL (ref 0.50–1.10)
Glucose, Bld: 68 mg/dL — ABNORMAL LOW (ref 70–99)
HCT: 43 % (ref 36.0–46.0)
Hemoglobin: 14.6 g/dL (ref 12.0–15.0)
Potassium: 3.5 mEq/L (ref 3.5–5.1)
Sodium: 144 mEq/L (ref 135–145)
TCO2: 29 mmol/L (ref 0–100)

## 2011-09-20 LAB — POCT I-STAT 4, (NA,K, GLUC, HGB,HCT)
Glucose, Bld: 71 mg/dL (ref 70–99)
HCT: 47 % — ABNORMAL HIGH (ref 36.0–46.0)
Hemoglobin: 16 g/dL — ABNORMAL HIGH (ref 12.0–15.0)
Potassium: 4 mEq/L (ref 3.5–5.1)
Sodium: 145 mEq/L (ref 135–145)

## 2011-09-20 LAB — GLUCOSE, CAPILLARY: Glucose-Capillary: 108 mg/dL — ABNORMAL HIGH (ref 70–99)

## 2011-09-20 SURGERY — ARTHROSCOPY, SHOULDER
Anesthesia: General | Site: Shoulder | Laterality: Left | Wound class: Clean

## 2011-09-20 MED ORDER — HYDROMORPHONE HCL PF 1 MG/ML IJ SOLN: 0.5000 mg | INTRAMUSCULAR | Status: DC | PRN

## 2011-09-20 MED ORDER — FUROSEMIDE 40 MG PO TABS
40.0000 mg | ORAL_TABLET | Freq: Every day | ORAL | Status: DC
  Filled 2011-09-20 (×2): qty 1

## 2011-09-20 MED ORDER — MIDAZOLAM HCL 5 MG/5ML IJ SOLN
INTRAMUSCULAR | Status: DC | PRN
  Administered 2011-09-20: 2 mg via INTRAVENOUS

## 2011-09-20 MED ORDER — ACETAMINOPHEN 650 MG RE SUPP: 650.0000 mg | Freq: Four times a day (QID) | RECTAL | Status: DC | PRN

## 2011-09-20 MED ORDER — PANTOPRAZOLE SODIUM 40 MG PO TBEC
40.0000 mg | DELAYED_RELEASE_TABLET | Freq: Every day | ORAL | Status: DC
  Filled 2011-09-20: qty 1

## 2011-09-20 MED ORDER — METOCLOPRAMIDE HCL 5 MG/ML IJ SOLN: 5.0000 mg | Freq: Three times a day (TID) | INTRAMUSCULAR | Status: DC | PRN

## 2011-09-20 MED ORDER — CHLORHEXIDINE GLUCONATE 4 % EX LIQD: 60.0000 mL | Freq: Once | CUTANEOUS | Status: DC

## 2011-09-20 MED ORDER — KETOROLAC TROMETHAMINE 30 MG/ML IJ SOLN
INTRAMUSCULAR | Status: DC | PRN
  Administered 2011-09-20: 30 mg via INTRAVENOUS

## 2011-09-20 MED ORDER — ROPIVACAINE HCL 5 MG/ML IJ SOLN
INTRAMUSCULAR | Status: DC | PRN
  Administered 2011-09-20: 30 mL

## 2011-09-20 MED ORDER — MENTHOL 3 MG MT LOZG: 1.0000 | LOZENGE | OROMUCOSAL | Status: DC | PRN

## 2011-09-20 MED ORDER — OXYCODONE-ACETAMINOPHEN 7.5-325 MG PO TABS
1.0000 | ORAL_TABLET | ORAL | Status: DC | PRN
  Filled 2011-09-20: qty 2

## 2011-09-20 MED ORDER — OXYCODONE HCL 5 MG PO TABS
5.0000 mg | ORAL_TABLET | ORAL | Status: DC | PRN
  Administered 2011-09-20: 5 mg via ORAL
  Administered 2011-09-21: 10 mg via ORAL
  Filled 2011-09-20 (×3): qty 1

## 2011-09-20 MED ORDER — ASPIRIN 81 MG PO TABS: 81.0000 mg | ORAL_TABLET | Freq: Every day | ORAL | Status: DC

## 2011-09-20 MED ORDER — LACTATED RINGERS IV SOLN
INTRAVENOUS | Status: DC | PRN
  Administered 2011-09-20 (×3): via INTRAVENOUS

## 2011-09-20 MED ORDER — METOCLOPRAMIDE HCL 10 MG PO TABS: 5.0000 mg | ORAL_TABLET | Freq: Three times a day (TID) | ORAL | Status: DC | PRN

## 2011-09-20 MED ORDER — LACTATED RINGERS IV SOLN
INTRAVENOUS | Status: DC
  Administered 2011-09-20: 15:00:00 via INTRAVENOUS

## 2011-09-20 MED ORDER — ACETAMINOPHEN 325 MG PO TABS: 650.0000 mg | ORAL_TABLET | Freq: Four times a day (QID) | ORAL | Status: DC | PRN

## 2011-09-20 MED ORDER — SUCCINYLCHOLINE CHLORIDE 20 MG/ML IJ SOLN
INTRAMUSCULAR | Status: DC | PRN
  Administered 2011-09-20: 120 mg via INTRAVENOUS

## 2011-09-20 MED ORDER — METHOCARBAMOL 500 MG PO TABS: 500.0000 mg | ORAL_TABLET | Freq: Four times a day (QID) | ORAL | Status: DC | PRN

## 2011-09-20 MED ORDER — METHOCARBAMOL 500 MG PO TABS
500.0000 mg | ORAL_TABLET | Freq: Three times a day (TID) | ORAL | Status: DC
  Filled 2011-09-20 (×4): qty 1

## 2011-09-20 MED ORDER — ONDANSETRON HCL 4 MG/2ML IJ SOLN: 4.0000 mg | Freq: Four times a day (QID) | INTRAMUSCULAR | Status: DC | PRN

## 2011-09-20 MED ORDER — METHOCARBAMOL 100 MG/ML IJ SOLN
500.0000 mg | Freq: Four times a day (QID) | INTRAVENOUS | Status: DC | PRN
  Filled 2011-09-20: qty 5

## 2011-09-20 MED ORDER — INSULIN GLARGINE 100 UNIT/ML ~~LOC~~ SOLN
60.0000 [IU] | Freq: Every day | SUBCUTANEOUS | Status: DC
  Administered 2011-09-20: 50 [IU] via SUBCUTANEOUS

## 2011-09-20 MED ORDER — LISINOPRIL 20 MG PO TABS
20.0000 mg | ORAL_TABLET | Freq: Every day | ORAL | Status: DC
  Filled 2011-09-20 (×2): qty 1

## 2011-09-20 MED ORDER — ROCURONIUM BROMIDE 100 MG/10ML IV SOLN
INTRAVENOUS | Status: DC | PRN
  Administered 2011-09-20: 10 mg via INTRAVENOUS
  Administered 2011-09-20: 30 mg via INTRAVENOUS

## 2011-09-20 MED ORDER — SODIUM CHLORIDE 0.9 % IR SOLN
Status: DC | PRN
  Administered 2011-09-20: 15:00:00

## 2011-09-20 MED ORDER — CLINDAMYCIN PHOSPHATE 600 MG/50ML IV SOLN
INTRAVENOUS | Status: DC | PRN
  Administered 2011-09-20: 900 mg via INTRAVENOUS

## 2011-09-20 MED ORDER — ASPIRIN 81 MG PO CHEW
81.0000 mg | CHEWABLE_TABLET | Freq: Every day | ORAL | Status: DC
  Administered 2011-09-20: 81 mg via ORAL
  Filled 2011-09-20 (×2): qty 1

## 2011-09-20 MED ORDER — PROMETHAZINE HCL 25 MG/ML IJ SOLN: 6.2500 mg | INTRAMUSCULAR | Status: DC | PRN

## 2011-09-20 MED ORDER — METFORMIN HCL 500 MG PO TABS
500.0000 mg | ORAL_TABLET | Freq: Two times a day (BID) | ORAL | Status: DC
  Administered 2011-09-21: 500 mg via ORAL
  Filled 2011-09-20 (×4): qty 1

## 2011-09-20 MED ORDER — ONDANSETRON HCL 4 MG/2ML IJ SOLN
INTRAMUSCULAR | Status: DC | PRN
  Administered 2011-09-20: 4 mg via INTRAVENOUS

## 2011-09-20 MED ORDER — MIDAZOLAM HCL 2 MG/2ML IJ SOLN
2.0000 mg | Freq: Once | INTRAMUSCULAR | Status: AC
  Administered 2011-09-20: 1 mg via INTRAVENOUS

## 2011-09-20 MED ORDER — CEFAZOLIN SODIUM-DEXTROSE 2-3 GM-% IV SOLR
2.0000 g | Freq: Four times a day (QID) | INTRAVENOUS | Status: AC
  Administered 2011-09-20 – 2011-09-21 (×3): 2 g via INTRAVENOUS
  Filled 2011-09-20 (×3): qty 50

## 2011-09-20 MED ORDER — CEFAZOLIN SODIUM-DEXTROSE 2-3 GM-% IV SOLR: 2.0000 g | INTRAVENOUS | Status: DC

## 2011-09-20 MED ORDER — LIDOCAINE HCL (CARDIAC) 20 MG/ML IV SOLN
INTRAVENOUS | Status: DC | PRN
  Administered 2011-09-20: 50 mg via INTRAVENOUS

## 2011-09-20 MED ORDER — PROPOFOL 10 MG/ML IV EMUL
INTRAVENOUS | Status: DC | PRN
  Administered 2011-09-20: 200 mg via INTRAVENOUS

## 2011-09-20 MED ORDER — EPHEDRINE SULFATE 50 MG/ML IJ SOLN
INTRAMUSCULAR | Status: DC | PRN
  Administered 2011-09-20 (×2): 10 mg via INTRAVENOUS
  Administered 2011-09-20: 20 mg via INTRAVENOUS

## 2011-09-20 MED ORDER — DOCUSATE SODIUM 100 MG PO CAPS: 100.0000 mg | ORAL_CAPSULE | Freq: Every day | ORAL | Status: DC

## 2011-09-20 MED ORDER — LACTATED RINGERS IV SOLN: INTRAVENOUS | Status: DC

## 2011-09-20 MED ORDER — INSULIN ASPART 100 UNIT/ML ~~LOC~~ SOLN
0.0000 [IU] | Freq: Three times a day (TID) | SUBCUTANEOUS | Status: DC
  Administered 2011-09-21: 7 [IU] via SUBCUTANEOUS

## 2011-09-20 MED ORDER — BUPIVACAINE-EPINEPHRINE 0.25% -1:200000 IJ SOLN
INTRAMUSCULAR | Status: DC | PRN
  Administered 2011-09-20: 8 mL

## 2011-09-20 MED ORDER — GLYCOPYRROLATE 0.2 MG/ML IJ SOLN
INTRAMUSCULAR | Status: DC | PRN
  Administered 2011-09-20: 0.6 mg via INTRAVENOUS

## 2011-09-20 MED ORDER — PHENOL 1.4 % MT LIQD: 1.0000 | OROMUCOSAL | Status: DC | PRN

## 2011-09-20 MED ORDER — METOPROLOL TARTRATE 25 MG PO TABS
25.0000 mg | ORAL_TABLET | Freq: Two times a day (BID) | ORAL | Status: DC
  Administered 2011-09-20: 25 mg via ORAL
  Filled 2011-09-20 (×3): qty 1

## 2011-09-20 MED ORDER — 0.9 % SODIUM CHLORIDE (POUR BTL) OPTIME
TOPICAL | Status: DC | PRN
  Administered 2011-09-20: 500 mL

## 2011-09-20 MED ORDER — CEFAZOLIN SODIUM 1-5 GM-% IV SOLN
INTRAVENOUS | Status: DC | PRN
  Administered 2011-09-20: 2 g via INTRAVENOUS

## 2011-09-20 MED ORDER — GABAPENTIN 300 MG PO CAPS
300.0000 mg | ORAL_CAPSULE | Freq: Two times a day (BID) | ORAL | Status: DC
  Administered 2011-09-20: 300 mg via ORAL
  Filled 2011-09-20 (×3): qty 1

## 2011-09-20 MED ORDER — ONDANSETRON HCL 4 MG PO TABS
4.0000 mg | ORAL_TABLET | Freq: Four times a day (QID) | ORAL | Status: DC | PRN
  Filled 2011-09-20: qty 1

## 2011-09-20 MED ORDER — SERTRALINE HCL 25 MG PO TABS
25.0000 mg | ORAL_TABLET | Freq: Every day | ORAL | Status: DC
  Administered 2011-09-20: 25 mg via ORAL
  Filled 2011-09-20 (×2): qty 1

## 2011-09-20 MED ORDER — FENTANYL CITRATE 0.05 MG/ML IJ SOLN
50.0000 ug | INTRAMUSCULAR | Status: DC | PRN
  Administered 2011-09-20: 50 ug via INTRAVENOUS

## 2011-09-20 MED ORDER — FENTANYL CITRATE 0.05 MG/ML IJ SOLN
INTRAMUSCULAR | Status: DC | PRN
  Administered 2011-09-20: 100 ug via INTRAVENOUS
  Administered 2011-09-20 (×2): 50 ug via INTRAVENOUS

## 2011-09-20 MED ORDER — DOCUSATE SODIUM 100 MG PO CAPS: 100.0000 mg | ORAL_CAPSULE | Freq: Two times a day (BID) | ORAL | Status: DC

## 2011-09-20 MED ORDER — ALBUTEROL SULFATE 1.25 MG/3ML IN NEBU
1.0000 | INHALATION_SOLUTION | Freq: Four times a day (QID) | RESPIRATORY_TRACT | Status: DC | PRN
  Filled 2011-09-20: qty 3

## 2011-09-20 MED ORDER — NEOSTIGMINE METHYLSULFATE 1 MG/ML IJ SOLN
INTRAMUSCULAR | Status: DC | PRN
  Administered 2011-09-20: 4 mg via INTRAVENOUS

## 2011-09-20 MED ORDER — SODIUM CHLORIDE 0.45 % IV SOLN
INTRAVENOUS | Status: DC
  Administered 2011-09-20: via INTRAVENOUS

## 2011-09-20 MED ORDER — FENTANYL CITRATE 0.05 MG/ML IJ SOLN
25.0000 ug | INTRAMUSCULAR | Status: DC | PRN
  Administered 2011-09-20: 50 ug via INTRAVENOUS

## 2011-09-20 SURGICAL SUPPLY — 78 items
ANCH SUT 2 CP-2 ROTR CUF (Anchor) ×2 IMPLANT
ANCHOR ROTATOR CUFF #2 (Anchor) ×4 IMPLANT
APL SKNCLS STERI-STRIP NONHPOA (GAUZE/BANDAGES/DRESSINGS) ×1
BENZOIN TINCTURE PRP APPL 2/3 (GAUZE/BANDAGES/DRESSINGS) ×2 IMPLANT
BLADE 4.2CUDA (BLADE) IMPLANT
BLADE CUDA SHAVER 3.5 (BLADE) ×2 IMPLANT
BLADE CUTTER GATOR 3.5 (BLADE) IMPLANT
BLADE FLAT COURSE (BLADE) ×1 IMPLANT
BLADE GREAT WHITE 4.2 (BLADE) IMPLANT
BLADE SURG 11 STRL SS (BLADE) ×2 IMPLANT
BNDG COHESIVE 4X5 TAN NS LF (GAUZE/BANDAGES/DRESSINGS) ×2 IMPLANT
BUR OVAL 4.0 (BURR) IMPLANT
BUR VERTEX HOODED 4.5 (BURR) IMPLANT
CANISTER SUCT LVC 12 LTR MEDI- (MISCELLANEOUS) ×2 IMPLANT
CANISTER SUCTION 2500CC (MISCELLANEOUS) ×1 IMPLANT
CANNULA ACUFLEX KIT 5X76 (CANNULA) ×2 IMPLANT
CANNULA SHOULDER 7CM (CANNULA) ×1 IMPLANT
CANNULA TWIST IN 8.25X7CM (CANNULA) IMPLANT
CLEANER CAUTERY TIP 5X5 PAD (MISCELLANEOUS) IMPLANT
CLOTH BEACON ORANGE TIMEOUT ST (SAFETY) ×2 IMPLANT
DRAPE LG THREE QUARTER DISP (DRAPES) ×1 IMPLANT
DRAPE STERI 35X30 U-POUCH (DRAPES) ×2 IMPLANT
DRAPE U 20/CS (DRAPES) ×2 IMPLANT
DURAPREP 26ML APPLICATOR (WOUND CARE) ×2 IMPLANT
ELECT MENISCUS 165MM 90D (ELECTRODE) IMPLANT
ELECT NDL TIP 2.8 STRL (NEEDLE) ×1 IMPLANT
ELECT NEEDLE TIP 2.8 STRL (NEEDLE) ×2 IMPLANT
ELECT REM PT RETURN 9FT ADLT (ELECTROSURGICAL) ×2
ELECTRODE REM PT RTRN 9FT ADLT (ELECTROSURGICAL) ×1 IMPLANT
GLOVE BIO SURGEON STRL SZ 6.5 (GLOVE) ×1 IMPLANT
GLOVE BIO SURGEON STRL SZ7.5 (GLOVE) ×2 IMPLANT
GLOVE BIOGEL PI IND STRL 7.0 (GLOVE) IMPLANT
GLOVE BIOGEL PI INDICATOR 7.0 (GLOVE) ×1
GLOVE INDICATOR 6.5 STRL GRN (GLOVE) ×1 IMPLANT
GLOVE INDICATOR 7.0 STRL GRN (GLOVE) ×1 IMPLANT
GLOVE SURG SS PI 8.0 STRL IVOR (GLOVE) ×2 IMPLANT
GOWN STRL NON-REIN LRG LVL3 (GOWN DISPOSABLE) ×5 IMPLANT
NDL FILTER BLUNT 18X1 1/2 (NEEDLE) ×1 IMPLANT
NDL SAFETY ECLIPSE 18X1.5 (NEEDLE) ×1 IMPLANT
NDL SUT 6 .5 CRC .975X.05 MAYO (NEEDLE) IMPLANT
NEEDLE 1/2 CIR CATGUT .05X1.09 (NEEDLE) IMPLANT
NEEDLE FILTER BLUNT 18X 1/2SAF (NEEDLE) ×1
NEEDLE FILTER BLUNT 18X1 1/2 (NEEDLE) ×1 IMPLANT
NEEDLE HYPO 18GX1.5 SHARP (NEEDLE) ×2
NEEDLE MAYO TAPER (NEEDLE)
NEEDLE SPNL 18GX3.5 QUINCKE PK (NEEDLE) ×2 IMPLANT
NS IRRIG 500ML POUR BTL (IV SOLUTION) ×1 IMPLANT
O.O VICRYL OS-2 J704 ×2 IMPLANT
PACK BASIN DAY SURGERY FS (CUSTOM PROCEDURE TRAY) ×2 IMPLANT
PACK SHOULDER CUSTOM OPM052 (CUSTOM PROCEDURE TRAY) ×2 IMPLANT
PAD CLEANER CAUTERY TIP 5X5 (MISCELLANEOUS) ×1
SET ARTHROSCOPY TUBING (MISCELLANEOUS) ×2
SET ARTHROSCOPY TUBING LN (MISCELLANEOUS) ×1 IMPLANT
SLING ARM FOAM STRAP LRG (SOFTGOODS) IMPLANT
SLING ULTRA II LARGE (SOFTGOODS) ×1 IMPLANT
STAPLER VISISTAT 35W (STAPLE) ×1 IMPLANT
STRIP CLOSURE SKIN 1/2X4 (GAUZE/BANDAGES/DRESSINGS) ×1 IMPLANT
SUCTION FRAZIER TIP 10 FR DISP (SUCTIONS) ×2 IMPLANT
SUT BONE WAX W31G (SUTURE) IMPLANT
SUT ETHIBOND 1 OS 2 18 CR3 (SUTURE) ×1 IMPLANT
SUT ETHIBOND 2 OS 4 DA (SUTURE) ×1 IMPLANT
SUT ETHILON 4 0 PS 2 18 (SUTURE) ×2 IMPLANT
SUT FIBERWIRE #2 38 REV NDL BL (SUTURE)
SUT MON AB 4-0 PC3 18 (SUTURE) IMPLANT
SUT PDS AB 0 CT 36 (SUTURE) IMPLANT
SUT PDS AB 0 CT1 36 (SUTURE) ×1 IMPLANT
SUT PROLENE 3 0 PS 2 (SUTURE) ×1 IMPLANT
SUT VIC AB 1 CT1 36 (SUTURE) ×1 IMPLANT
SUT VIC AB 1-0 CT2 27 (SUTURE) ×2 IMPLANT
SUT VIC AB 2-0 CT2 27 (SUTURE) ×3 IMPLANT
SUT VICRYL 0 UR6 27IN ABS (SUTURE) ×3 IMPLANT
SUT VICRYL 0-0 OS 2 NEEDLE (SUTURE) IMPLANT
SUTURE FIBERWR#2 38 REV NDL BL (SUTURE) IMPLANT
SYRINGE 10CC LL (SYRINGE) ×2 IMPLANT
TAPE HYPAFIX 6X30 (GAUZE/BANDAGES/DRESSINGS) ×2 IMPLANT
TOWEL OR 17X24 6PK STRL BLUE (TOWEL DISPOSABLE) ×4 IMPLANT
WAND 90 DEG TURBOVAC W/CORD (SURGICAL WAND) ×2 IMPLANT
WATER STERILE IRR 500ML POUR (IV SOLUTION) ×2 IMPLANT

## 2011-09-20 NOTE — Progress Notes (Signed)
Transferred to 1614 via stretcher w 02 @ 2l/min [place] accompanied by nurse & nurse tech without difficulty.

## 2011-09-20 NOTE — Progress Notes (Signed)
Report given to Gershon Crane RN on 1614  [facility] [place] )

## 2011-09-20 NOTE — Anesthesia Postprocedure Evaluation (Signed)
  Anesthesia Post-op Note  Patient: Sherry [name]  Procedure(s) Performed: Procedure(s) (LRB): ARTHROSCOPY SHOULDER (Left)  Patient Location: PACU  Anesthesia Type: GA combined with regional for post-op pain  Level of Consciousness: awake and alert   Airway and Oxygen Therapy: Patient Spontanous Breathing  Post-op Pain: mild  Post-op Assessment: Post-op Vital signs reviewed, Patient's Cardiovascular Status Stable, Respiratory Function Stable, Patent Airway and No signs of Nausea or vomiting  Post-op Vital Signs: stable  Complications: No apparent anesthesia complications

## 2011-09-20 NOTE — H&P (Signed)
Sherry [name] is an 61 y.o. female.   Chief Complaint: Left RCT HPI: RC tear left shoulder.  Past Medical History  Diagnosis Date  . Diabetes mellitus   . Hypertension   . GERD (gastroesophageal reflux disease)   . Dyslipidemia   . Mitral valve prolapse occasional palpitations w/ chest discomfort  . DJD (degenerative joint disease)   . Meniere's disease   . Rotator cuff tear, left   . History of CHF (congestive heart failure) 2010-  RESOLVED  . H/O hiatal hernia   . Complication of anesthesia HYPOTENSION INTRAOP W/ HYSTERECTOMY IN 1987--  DID OK W/ TOTAL KNEE IN 2008  . OA (osteoarthritis)   . History of peptic ulcer YRS AGO    Past Surgical History  Procedure Date  . Total knee arthroplasty 09-12-2006  [facility]    RIGHT KNEE  . Vaginal hysterectomy 1987  . Bunionectomy 2007    LEFT FOOT  . Tympanoplasty 1990;   1980  . Left shoulder surgery 1997    ROTATOR CUFF REPAIR  . Tubal ligation 1976    Family History  Problem Relation Age of Onset  . Heart disease Mother   . Diabetes Mother   . Kidney disease Mother    Social History:  reports that she has never smoked. She has never used smokeless tobacco. She reports that she does not drink alcohol or use illicit drugs.  Allergies:  Allergies  Allergen Reactions  . Influenza Vaccines Anaphylaxis  . Codeine Itching  . Demerol Other (See Comments)    SEIZURE  . Nsaids Other (See Comments)    NAPROXEN, IBUPROFEN, SKELAXIN---  CAUSES PALPITATIONS    Medications Prior to Admission  Medication Sig Dispense Refill  . ALBUTEROL IN Inhale into the lungs as needed.      Marland Kitchen aspirin 81 MG tablet Take 81 mg by mouth daily.      Marland Kitchen docusate sodium (COLACE) 100 MG capsule Take 100 mg by mouth daily.      . furosemide (LASIX) 40 MG tablet Take 40 mg by mouth daily.       . Gabapentin (NEURONTIN PO) Take 300 mg by mouth 2 (two) times daily.       . insulin glargine (LANTUS) 100 UNIT/ML injection Inject 60 Units into the skin at  bedtime.       Marland Kitchen lisinopril (PRINIVIL,ZESTRIL) 20 MG tablet Take 20 mg by mouth daily.      . metFORMIN (GLUCOPHAGE) 500 MG tablet Take 500 mg by mouth 2 (two) times daily with a meal.      . methocarbamol (ROBAXIN) 500 MG tablet Take 500 mg by mouth as needed.      . metoprolol tartrate (LOPRESSOR) 25 MG tablet Take 25 mg by mouth 2 (two) times daily.      Marland Kitchen omeprazole (PRILOSEC) 20 MG capsule Take 20 mg by mouth every morning.      Marland Kitchen oxyCODONE-acetaminophen (PERCOCET) 7.5-325 MG per tablet Take 1 tablet by mouth every 4 (four) hours as needed.      . sertraline (ZOLOFT) 25 MG tablet Take 25 mg by mouth at bedtime.      Marland Kitchen albuterol (ACCUNEB) 1.25 MG/3ML nebulizer solution Take 1 ampule by nebulization every 6 (six) hours as needed.        Results for orders placed during the hospital encounter of 09/20/11 (from the past 48 hour(s))  POCT I-STAT 4, (NA,K, GLUC, HGB,HCT)     Status: Abnormal   Collection Time   09/20/11  12:18 PM      Component Value Range Comment   Sodium 145  135 - 145 mEq/L    Potassium 4.0  3.5 - 5.1 mEq/L    Glucose, Bld 71  70 - 99 mg/dL    HCT 16.1 (*) 09.6 - 46.0 %    Hemoglobin 16.0 (*) 12.0 - 15.0 g/dL    No results found.  Review of Systems  Constitutional: Negative.   HENT: Negative.   Respiratory: Negative.   Cardiovascular: Negative.   Gastrointestinal: Negative.   Genitourinary: Negative.   Musculoskeletal: Positive for joint pain.       +impingement left shoulder. NT Left AC.  Skin: Negative.   Neurological: Negative.   Psychiatric/Behavioral: Negative.     Blood pressure 167/79, height 5\' 3"  (1.6 m), weight 103.42 kg (228 lb). Physical Exam  Constitutional: She is oriented to person, place, and time. She appears well-developed.  HENT:  Head: Normocephalic.  Eyes: Pupils are equal, round, and reactive to light.  Neck: Normal range of motion.  Cardiovascular: Normal rate.   Respiratory: Effort normal.  GI: Soft.  Musculoskeletal: She  exhibits tenderness.       +impingement left shoulder. NT [facility]  Neurological: She is alert and oriented to person, place, and time.  Skin: Skin is warm.  Psychiatric: She has a normal mood and affect.     Assessment/Plan RC tear left shoulder. Plan RCR. Risks discussed incl. Infection DVT  Etc.  Doyce Saling C 09/20/2011, 1:11 PM

## 2011-09-20 NOTE — Brief Op Note (Signed)
09/20/2011  3:42 PM  PATIENT:  Sherry [name]  61 y.o. female  PRE-OPERATIVE DIAGNOSIS:  LEFT ROTATOR CUFF TEAR  POST-OPERATIVE DIAGNOSIS:  LEFT ROTATOR CUFF TEAR  PROCEDURE:  Procedure(s) (LRB): ARTHROSCOPY SHOULDER (Left)  SURGEON:  Surgeon(s) and Role:    * Javier Docker, MD - Primary  PHYSICIAN ASSISTANT:   ASSISTANTS: none   ANESTHESIA:   general  EBL:  Total I/O In: 1000 [I.V.:1000] Out: -   BLOOD ADMINISTERED:none  DRAINS: none   LOCAL MEDICATIONS USED:  MARCAINE     SPECIMEN:  No Specimen  DISPOSITION OF SPECIMEN:  N/A  COUNTS:  YES  TOURNIQUET:  * No tourniquets in log *  DICTATION: .Other Dictation: Dictation Number [id number]  PLAN OF CARE: Admit for overnight observation  PATIENT DISPOSITION:  PACU - hemodynamically stable.   Delay start of Pharmacological VTE agent (>24hrs) due to surgical blood loss or risk of bleeding: no

## 2011-09-20 NOTE — Anesthesia Preprocedure Evaluation (Addendum)
Anesthesia Evaluation    Airway Mallampati: II TM Distance: >3 FB Neck ROM: Full    Dental No notable dental hx.    Pulmonary  breath sounds clear to auscultation  Pulmonary exam normal       Cardiovascular hypertension, Pt. on medications and Pt. on home beta blockers Rhythm:Regular Rate:Normal  ECG reviewed.: SB with nonspecific TWA   Neuro/Psych  Neuromuscular disease    GI/Hepatic hiatal hernia, GERD-  Medicated,  Endo/Other  Type 2, Oral Hypoglycemic Agents and Insulin DependentMorbid obesity  Renal/GU      Musculoskeletal   Abdominal (+) + obese,   Peds  Hematology   Anesthesia Other Findings   Reproductive/Obstetrics                          Anesthesia Physical Anesthesia Plan  ASA: III  Anesthesia Plan: General   Post-op Pain Management:    Induction: Intravenous  Airway Management Planned: Oral ETT  Additional Equipment:   Intra-op Plan:   Post-operative Plan: Extubation in OR  Informed Consent: I have reviewed the patients History and Physical, chart, labs and discussed the procedure including the risks, benefits and alternatives for the proposed anesthesia with the patient or authorized representative who has indicated his/her understanding and acceptance.   Dental advisory given  Plan Discussed with: CRNA  Anesthesia Plan Comments: (Discussed risks and benefits of interscalene block including failure, bleeding, infection, nerve damage, weakness. Questions answered. Patient consents to block. )       Anesthesia Quick Evaluation

## 2011-09-20 NOTE — Transfer of Care (Signed)
Immediate Anesthesia Transfer of Care Note  Patient: Sherry [name]  Procedure(s) Performed: Procedure(s) (LRB): ARTHROSCOPY SHOULDER (Left)  Patient Location: PACU  Anesthesia Type: General  Level of Consciousness: awake, sedated, patient responds to stimulation, emergence with combativeness.  Airway & Oxygen Therapy: Patient Spontanous Breathing and Patient connected to face mask oxygen, nasal trumpet # 8.0 inserted right nare for better airway exchange FM O2 ost-op Assessment: Report given to PACU RN, Post -op Vital signs reviewed and stable and Patient moving all extremities  Post vital signs: Reviewed and stable  Complications: No apparent anesthesia complications

## 2011-09-20 NOTE — Anesthesia Procedure Notes (Addendum)
Anesthesia Regional Block:  Interscalene brachial plexus block  Pre-Anesthetic Checklist: ,, timeout performed, Correct Patient, Correct Site, Correct Laterality, Correct Procedure, Correct Position, site marked, Risks and benefits discussed,  Surgical consent,  Pre-op evaluation,  At surgeon's request and post-op pain management  Laterality: Left and Upper  Prep: chloraprep       Needles:  Injection technique: Single-shot      Additional Needles:  Procedures: ultrasound guided and nerve stimulator Interscalene brachial plexus block  Nerve Stimulator or Paresthesia:  Response: left biceps, 0.6 mA,   Additional Responses:   Narrative:   Performed by: Personally  Anesthesiologist: denenny  Additional Notes:  No pain on injection. No increased resistance to injection.  Motor intact immediately after block. Loss of deltoid function at 20 minutes.      Procedure Name: Intubation Date/Time: 09/20/2011 1:44 PM Performed by: Jessica Priest Pre-anesthesia Checklist: Patient identified, Emergency Drugs available, Suction available and Patient being monitored Patient Re-evaluated:Patient Re-evaluated prior to inductionOxygen Delivery Method: Circle System Utilized Preoxygenation: Pre-oxygenation with 100% oxygen Intubation Type: IV induction Ventilation: Mask ventilation without difficulty Laryngoscope Size: Mac and 4 Grade View: Grade II Tube type: Oral Tube size: 7.0 mm Number of attempts: 1 Airway Equipment and Method: stylet and oral airway Placement Confirmation: ETT inserted through vocal cords under direct vision,  positive ETCO2 and breath sounds checked- equal and bilateral Secured at: 23 cm Tube secured with: Tape Dental Injury: Teeth and Oropharynx as per pre-operative assessment  Comments: Lidocaine LTA sprayed entire syringe in airway on vc. No problems. LB

## 2011-09-21 MED ORDER — OXYCODONE-ACETAMINOPHEN 7.5-325 MG PO TABS: 1.0000 | ORAL_TABLET | ORAL | Status: AC | PRN

## 2011-09-21 MED ORDER — METHOCARBAMOL 500 MG PO TABS: 500.0000 mg | ORAL_TABLET | Freq: Three times a day (TID) | ORAL | Status: AC

## 2011-09-21 NOTE — Discharge Summary (Signed)
Physician Discharge Summary  Patient ID: Sherry [name] MRN: [id number] DOB/AGE: 1950/02/12 61 y.o.  Admit date: 09/20/2011 Discharge date: 09/21/2011  Admission Diagnoses: Rotator cuff tear left.  Discharge Diagnoses: Same Active Problems:  * No active hospital problems. *    Discharged Condition: good  Hospital Course: Uncomplicated. Did wake up from anesthesia moving arms. Required restraint.  Consults: None  Significant Diagnostic Studies: labs: none  Treatments: surgery: Rotator cuff repair  Discharge Exam: Blood pressure 132/82, pulse 84, temperature 97.9 F (36.6 C), temperature source Oral, resp. rate 14, height 5\' 3"  (1.6 m), weight 100.245 kg (221 lb), SpO2 97.00%. Incision/Wound: clean. Scant dressing stain.  Disposition:    Medication List  As of 09/21/2011  6:54 AM   ASK your doctor about these medications         albuterol 1.25 MG/3ML nebulizer solution   Commonly known as: ACCUNEB   Take 1 ampule by nebulization every 6 (six) hours as needed.      ALBUTEROL IN   Inhale into the lungs as needed.      aspirin 81 MG tablet   Take 81 mg by mouth daily.      docusate sodium 100 MG capsule   Commonly known as: COLACE   Take 100 mg by mouth daily.      furosemide 40 MG tablet   Commonly known as: LASIX   Take 40 mg by mouth daily.      insulin glargine 100 UNIT/ML injection   Commonly known as: LANTUS   Inject 60 Units into the skin at bedtime.      lisinopril 20 MG tablet   Commonly known as: PRINIVIL,ZESTRIL   Take 20 mg by mouth daily.      metFORMIN 500 MG tablet   Commonly known as: GLUCOPHAGE   Take 500 mg by mouth 2 (two) times daily with a meal.      methocarbamol 500 MG tablet   Commonly known as: ROBAXIN   Take 500 mg by mouth as needed.      metoprolol tartrate 25 MG tablet   Commonly known as: LOPRESSOR   Take 25 mg by mouth 2 (two) times daily.      NEURONTIN PO   Take 300 mg by mouth 2 (two) times daily.     omeprazole 20 MG capsule   Commonly known as: PRILOSEC   Take 20 mg by mouth every morning.      oxyCODONE-acetaminophen 7.5-325 MG per tablet   Commonly known as: PERCOCET   Take 1 tablet by mouth every 4 (four) hours as needed.      sertraline 25 MG tablet   Commonly known as: ZOLOFT   Take 25 mg by mouth at bedtime.           Follow-up Information    Follow up with Javier Docker, MD.   Contact information:   [facility] [address], [address] [place] [address] [phone]          Signed: Javier Docker 09/21/2011, 6:54 AM

## 2011-09-21 NOTE — Care Management Note (Signed)
    Page 1 of 2   09/21/2011     11:39:35 AM   CARE MANAGEMENT NOTE 09/21/2011  Patient:  Sherry [name], Sherry [name]   Account Number:  [id number]  Date Initiated:  09/21/2011  Documentation initiated by:  Colleen Can  Subjective/Objective Assessment:   dx  rotator cuff repair  Outpt procedure  Worker's comp claim.  Worker's comp case manager-Michelle-[phone]     Action/Plan:   Cm spoke with patient. Pt will need 3n1.  Spouse aqnd daughter will be caregivers.   Anticipated DC Date:  09/21/2011   Anticipated DC Plan:  HOME/SELF CARE  In-house referral  NA      DC Planning Services  CM consult      PAC Choice  DURABLE MEDICAL EQUIPMENT   Choice offered to / List presented to:  NA   DME arranged  3-N-1      DME agency  OTHER - SEE NOTE     HH arranged  NA      HH agency  NA   Status of service:  Completed, signed off Medicare Important Message given?  NO (If response is "NO", the following Medicare IM given date fields will be blank) Date Medicare IM given:   Date Additional Medicare IM given:    Discharge Disposition:  HOME/SELF CARE  Per UR Regulation:  Reviewed for med. necessity/level of care/duration of stay  If discussed at Long Length of Stay Meetings, dates discussed:    Comments:  09/21/2011 Raynelle Bring BSN CCM [phone] Tct worker's comp Case manager-Michelle-[phone]. She requested that DME order be faxed to her at [phone]. She will arrange for DME and have it delivered to patient's home. Pt advised .Pt is discharging today.

## 2011-09-21 NOTE — Progress Notes (Signed)
Subjective: 1 Day Post-Op Procedure(s) (LRB): ARTHROSCOPY SHOULDER (Left) Patient reports pain as 3 on 0-10 scale.    Objective: Vital signs in last 24 hours: Temp:  [96.4 F (35.8 [name])-98 F (36.7 [name])] 97.9 F (36.6 [name]) (08/13 0529) Pulse Rate:  [60-114] 84  (08/13 0529) Resp:  [8-18] 14  (08/13 0529) BP: (124-184)/(74-95) 132/82 mmHg (08/13 0529) SpO2:  [97 %-100 %] 97 % (08/13 0529) Weight:  [100.245 kg (221 lb)-100.387 kg (221 lb 5 oz)] 100.245 kg (221 lb) (08/12 1830)  Intake/Output from previous day: 08/12 0701 - 08/13 0700 In: 3420 [P.O.:120; I.V.:3250; IV Piggyback:50] Out: 850 [Urine:850] Intake/Output this shift: Total I/O In: 1020 [P.O.:120; I.V.:850; IV Piggyback:50] Out: 850 [Urine:850]   Basename 09/20/11 1333 09/20/11 1218  HGB 14.6 16.0*    Basename 09/20/11 1333 09/20/11 1218  WBC -- --  RBC -- --  HCT 43.0 47.0*  PLT -- --    Basename 09/20/11 1333 09/20/11 1218  NA 144 145  K 3.5 4.0  CL 104 --  CO2 -- --  BUN 14 --  CREATININE 0.80 --  GLUCOSE 68* 71  CALCIUM -- --   No results found for this basename: LABPT:2,INR:2 in the last 72 hours  Neurologically intact Intact pulses distally Incision: scant drainage  Assessment/Plan: 1 Day Post-Op Procedure(s) (LRB): ARTHROSCOPY SHOULDER (Left) Discharge home with home health. DC instr given. Wants 3/1 commode. F/U 10 days  Sherry [name] 09/21/2011, 6:50 AM

## 2011-09-21 NOTE — Progress Notes (Signed)
Occupational Therapy Note Eval completed and located in shadow chart. No further acute OT needs. Judithann Sauger OTR/L [phone] 09/21/2011

## 2011-09-21 NOTE — Op Note (Signed)
NAME:  Sherry [name], Sherry [name] NO.:  [id number]  MEDICAL RECORD NO.:  [id number]  LOCATION:  1614                         FACILITY:  [facility]  PHYSICIAN:  Jene Every, M.D.    DATE OF BIRTH:  [birth year]  DATE OF PROCEDURE:  09/20/2011 DATE OF DISCHARGE:                              OPERATIVE REPORT   PREOPERATIVE DIAGNOSES:  Rotator cuff tear and impingement syndrome, left shoulder.  POSTOPERATIVE DIAGNOSES:  Rotator cuff tear and impingement syndrome, left shoulder.  PROCEDURE PERFORMED: 1. Left shoulder arthroscopy subacromial decompression. 2. Mini-open rotator cuff repair utilizing Mitek suture anchors.  ANESTHESIA:  General.  ASSISTANT:  Surgical tech.  Surgical difficulty is increased by the patient's morbid obesity.  BRIEF HISTORY:  This is a pleasant 61 year old female who has had a near full-thickness tear of rotator cuff noted by MRI.  Secondary to a work injury, she was indicated for repair due to the painful shoulder and also to repair.  She is an insulin-dependent diabetic.  We discussed risks and benefits of repair including bleeding, infection, damage to vascular structures, suboptimal range of motion, recurrent tear, nonunion, need for revision, DVT, PE, anesthetic complications, etc.  TECHNIQUE:  With the patient in supine beach-chair position and after induction of adequate general anesthesia, 2 g Kefzol and 900 mg clindamycin, the left shoulder was examined under anesthesia.  She had full range.  The left shoulder and upper extremity was prepped and draped in usual sterile fashion.  Surgical marker was utilized to delineate the acromion, AC joint, and coracoid again.  The anterolateral and posterolateral aspect of the acromion were somewhat obscured by the patient's ample subcutaneous adipose tissue.  I made a small portal posterolaterally, small incision through the skin only posterolaterally. I then advanced the cannula into the subacromial  space with the arm in 0- 30 position.  I then used a small 11 blade for anterolateral portal through the skin only.  We triangulated with an arthroscopic portal in the exuberant subacromial bursa, and shaver was introduced and utilized to perform a bursectomy.  Full-thickness tear of the rotator cuff in the junction of the supraspinatus and infraspinatus was noted.  I released the CA ligament with an ArthroWand.  I performed a small acromioplasty in the anterolateral aspect of the acromion.  I then decided to convert this to a mini-open procedure.  We made a small incision over the anterolateral aspect of the acromion.  She had a type 3 type of an acromion and calcification of the coracoacromial ligament.  We made a small incision over the anterolateral aspect of the acromion. Subcutaneous tissue was dissected.  Electrocautery utilized to achieve the hemostasis.  Raphe between the anterolateral heads was identified, divided along the skin incision approximately 2 cm from the anterolateral aspect of the acromion.  I placed a tunnel retractor.  I subperiosteally elevated and obtained the attachment of the deltoid from the anterolateral and anteromedial aspect of the acromion and had a 2-mm Kerrison removing the residual subacromial spurs.  She had a small spur over the tear.  I preserved the deltoid attachment.  Inspection revealed a full-thickness tear longitudinal to the junction of the supraspinatus and infraspinatus footprint, it was longitudinal.  I debrided the edges to good bleeding tissue.  It was approximately 2 cm in length.  Used a Investment banker, operational and __________ elevator to perform a bony bed just lateral to the articular surface.  Good bleeding tissue was noted.  Placed 2 Mitek suture anchors, two-prong, one exited articular surface and 2 more lateral.  After mobilizing the cuff and digitally lysing the subacromial adhesions, we threaded the Ethibond suture on either side of  the longitudinal split and delivered the anterior and posterior portions of the tear into the bony trough with good sliding knot that was then advanced with a knot pusher.  We then used a surgical knot over that with excellent repair and full  watertight coverage, oversewed the distal limb with 2-0 Vicryl simple suture.  Remainder of the cuff was unremarkable.  Throughout the cuff was attenuated.  The subscap and the posterior portion of the infraspinatus was unremarkable.  I copiously irrigated the wound just prior to the closure.  Repaired the raphe with #1 Vicryl interrupted figure-of-eight sutures over and top through the acromion.  Subcu with 2-0 Vicryl simple sutures in multiple layers, skin was reapproximated with 4-0 subcuticular Prolene.  All portals closed with Prolene as well.  Wound reinforced with Steri-Strips.  Sterile dressing applied, placed in an abduction pillow.  Extubated without difficulty and transported to the recovery room in satisfactory condition.  The patient tolerated the procedure well.  No complications.  Surgical tech was assistant.  Minimal blood loss.     Jene Every, M.D.     Cordelia Pen  D:  09/20/2011  T:  09/21/2011  Job:  [id number]

## 2011-09-22 ENCOUNTER — Encounter (HOSPITAL_BASED_OUTPATIENT_CLINIC_OR_DEPARTMENT_OTHER): Payer: Self-pay | Admitting: Specialist

## 2011-09-22 LAB — GLUCOSE, CAPILLARY
Glucose-Capillary: 196 mg/dL — ABNORMAL HIGH (ref 70–99)
Glucose-Capillary: 221 mg/dL — ABNORMAL HIGH (ref 70–99)

## 2011-09-24 ENCOUNTER — Encounter (HOSPITAL_COMMUNITY): Payer: Self-pay

## 2011-11-15 ENCOUNTER — Encounter: Payer: Self-pay | Admitting: *Deleted

## 2011-11-19 ENCOUNTER — Emergency Department (HOSPITAL_COMMUNITY): Payer: Self-pay

## 2011-11-19 ENCOUNTER — Encounter (HOSPITAL_COMMUNITY): Payer: Self-pay | Admitting: Emergency Medicine

## 2011-11-19 ENCOUNTER — Emergency Department (HOSPITAL_COMMUNITY)
Admission: EM | Admit: 2011-11-19 | Discharge: 2011-11-19 | Disposition: A | Discharge status: Home / Self Care | Payer: Self-pay | Attending: Emergency Medicine | Admitting: Emergency Medicine

## 2011-11-19 DIAGNOSIS — Z79899 Other long term (current) drug therapy: Secondary | ICD-10-CM | POA: Insufficient documentation

## 2011-11-19 DIAGNOSIS — R202 Paresthesia of skin: Secondary | ICD-10-CM

## 2011-11-19 DIAGNOSIS — I10 Essential (primary) hypertension: Secondary | ICD-10-CM | POA: Insufficient documentation

## 2011-11-19 DIAGNOSIS — E119 Type 2 diabetes mellitus without complications: Secondary | ICD-10-CM | POA: Insufficient documentation

## 2011-11-19 DIAGNOSIS — R079 Chest pain, unspecified: Secondary | ICD-10-CM | POA: Insufficient documentation

## 2011-11-19 DIAGNOSIS — R209 Unspecified disturbances of skin sensation: Secondary | ICD-10-CM | POA: Insufficient documentation

## 2011-11-19 LAB — CBC
HCT: 37.7 % (ref 36.0–46.0)
Hemoglobin: 12.3 g/dL (ref 12.0–15.0)
MCH: 28.9 pg (ref 26.0–34.0)
MCHC: 32.6 g/dL (ref 30.0–36.0)
MCV: 88.5 fL (ref 78.0–100.0)
Platelets: 271 10*3/uL (ref 150–400)
RBC: 4.26 MIL/uL (ref 3.87–5.11)
RDW: 13.8 % (ref 11.5–15.5)
WBC: 6.5 10*3/uL (ref 4.0–10.5)

## 2011-11-19 LAB — BASIC METABOLIC PANEL
BUN: 16 mg/dL (ref 6–23)
CO2: 25 mEq/L (ref 19–32)
Calcium: 9.2 mg/dL (ref 8.4–10.5)
Chloride: 104 mEq/L (ref 96–112)
Creatinine, Ser: 0.64 mg/dL (ref 0.50–1.10)
GFR calc Af Amer: >90 mL/min (ref >90)
GFR calc non Af Amer: >90 mL/min (ref >90)
Glucose, Bld: 206 mg/dL — ABNORMAL HIGH (ref 70–99)
Potassium: 3.9 mEq/L (ref 3.5–5.1)
Sodium: 142 mEq/L (ref 135–145)

## 2011-11-19 LAB — POCT PREGNANCY, URINE: Preg Test, Ur: NEGATIVE

## 2011-11-19 LAB — TROPONIN I: Troponin I: <0.3 ng/mL (ref <0.30)

## 2011-11-19 NOTE — ED Notes (Signed)
Patient transported to CT 

## 2011-11-19 NOTE — ED Notes (Signed)
Pt presenting to ed with c/o left side facial numbness pt states she was at physical therapy this morning and was told to present to ed due to her describing her symptoms. Pt denies slurred speech. Pt states she thought she had left side facial droop yesterday. Pt with no facial droop, slurred speech, or drift noted. Pt with bilateral equal grips noted.

## 2011-11-19 NOTE — Progress Notes (Signed)
Pt seen by providers at [facility] in [place]

## 2011-11-19 NOTE — ED Provider Notes (Signed)
History     CSN: [id number]  Arrival date & time 11/19/11  1032   First MD Initiated Contact with Patient 11/19/11 1057      Chief Complaint  Patient presents with  . Numbness  . Chest Pain    (Consider location/radiation/quality/duration/timing/severity/associated sxs/prior treatment) The history is provided by the patient.   patient states that starting yesterday she had some numbness in her left face. She states it feels like there is tingling in her mid face. For she thinks that she may have had some facial droop yesterday. No droop now. She's had a mild headache at times. No weakness. Her grandson said that she had  Started the next sentence before she completed patient is doing this. No abdominal pain. She states she's had occasional episodes of chest pain. No cough. No shortness of breath. She's pain-free now. No trouble seeing.   Past Medical History  Diagnosis Date  . Diabetes mellitus   . Hypertension   . GERD (gastroesophageal reflux disease)   . Dyslipidemia   . Mitral valve prolapse occasional palpitations w/ chest discomfort  . DJD (degenerative joint disease)   . Meniere's disease   . Rotator cuff tear, left   . History of CHF (congestive heart failure) 2010-  RESOLVED  . H/O hiatal hernia   . Complication of anesthesia HYPOTENSION INTRAOP W/ HYSTERECTOMY IN 1987--  DID OK W/ TOTAL KNEE IN 2008  . OA (osteoarthritis)   . History of peptic ulcer YRS AGO    Past Surgical History  Procedure Date  . Total knee arthroplasty 09-12-2006  [facility]    RIGHT KNEE  . Vaginal hysterectomy 1987  . Bunionectomy 2007    LEFT FOOT  . Tympanoplasty 1990;   1980  . Left shoulder surgery 1997    ROTATOR CUFF REPAIR  . Tubal ligation 1976  . Shoulder arthroscopy 09/20/2011    Procedure: ARTHROSCOPY SHOULDER;  Surgeon: Javier Docker, MD;  Location: [facility];  Service: Orthopedics;  Laterality: Left;  SAD AND MINI OPEN ROTATOR CUFF REPAIR      Family  History  Problem Relation Age of Onset  . Heart disease Mother   . Diabetes Mother   . Kidney disease Mother     History  Substance Use Topics  . Smoking status: Never Smoker   . Smokeless tobacco: Never Used  . Alcohol Use: No    OB History    Grav Para Term Preterm Abortions TAB SAB Ect Mult Living                  Review of Systems  Constitutional: Negative for activity change and appetite change.  HENT: Negative for neck stiffness.   Eyes: Negative for pain.  Respiratory: Negative for chest tightness and shortness of breath.   Cardiovascular: Positive for chest pain. Negative for leg swelling.  Gastrointestinal: Negative for nausea, vomiting, abdominal pain and diarrhea.  Genitourinary: Negative for flank pain.  Musculoskeletal: Negative for back pain.  Skin: Negative for rash.  Neurological: Positive for numbness. Negative for weakness and headaches.  Psychiatric/Behavioral: Negative for behavioral problems.    Allergies  Influenza vaccines; Codeine; Demerol; and Nsaids  Home Medications   Current Outpatient Rx  Name Route Sig Dispense Refill  . ALBUTEROL SULFATE HFA 108 (90 BASE) MCG/ACT IN AERS Inhalation Inhale 2 puffs into the lungs every 6 (six) hours as needed. For shortness of breath.    . ALBUTEROL SULFATE (2.5 MG/3ML) 0.083% IN NEBU Nebulization Take 2.5  mg by nebulization every 6 (six) hours as needed. For shortness of breath.    . ASPIRIN EC 81 MG PO TBEC Oral Take 81 mg by mouth daily.    Marland Kitchen DOCUSATE SODIUM 100 MG PO CAPS Oral Take 100 mg by mouth daily.    . FUROSEMIDE 40 MG PO TABS Oral Take 40 mg by mouth daily.     Marland Kitchen GABAPENTIN 300 MG PO CAPS Oral Take 300 mg by mouth 2 (two) times daily.    . INSULIN GLARGINE 100 UNIT/ML Volo SOLN Subcutaneous Inject 60 Units into the skin at bedtime.     Marland Kitchen LISINOPRIL 20 MG PO TABS Oral Take 20 mg by mouth daily.    Marland Kitchen METFORMIN HCL 500 MG PO TABS Oral Take 500 mg by mouth 2 (two) times daily with a meal.    .  METHOCARBAMOL 500 MG PO TABS Oral Take 500 mg by mouth daily as needed. For muscle spasms.    Marland Kitchen METOPROLOL TARTRATE 25 MG PO TABS Oral Take 25 mg by mouth 2 (two) times daily.    . OXYCODONE-ACETAMINOPHEN 7.5-325 MG PO TABS Oral Take 1 tablet by mouth every 8 (eight) hours as needed. For pain.    Marland Kitchen SERTRALINE HCL 25 MG PO TABS Oral Take 25 mg by mouth at bedtime.      BP 131/70  Pulse 68  Temp 98.6 F (37 C) (Oral)  Resp 20  SpO2 94%  Physical Exam  Nursing note and vitals reviewed. Constitutional: She is oriented to person, place, and time. She appears well-developed and well-nourished.  HENT:  Head: Normocephalic and atraumatic.  Eyes: EOM are normal. Pupils are equal, round, and reactive to light.  Neck: Normal range of motion. Neck supple.  Cardiovascular: Normal rate, regular rhythm and normal heart sounds.   No murmur heard. Pulmonary/Chest: Effort normal and breath sounds normal. No respiratory distress. She has no wheezes. She has no rales.  Abdominal: Soft. Bowel sounds are normal. She exhibits no distension. There is no tenderness. There is no rebound and no guarding.  Musculoskeletal: Normal range of motion.  Neurological: She is alert and oriented to person, place, and time. A cranial nerve deficit is present.       Paresthesias to the 2nd division of the 5th cranial nerve on the left Visual fields intact to confrontation. Extraocular movements intact. Good grip strength and sensation bilateral upper extremity is. Finger-nose intact.. Face symetric.  Skin: Skin is warm and dry.  Psychiatric: She has a normal mood and affect. Her speech is normal.    ED Course  Procedures (including critical care time)  Labs Reviewed  BASIC METABOLIC PANEL - Abnormal; Notable for the following:    Glucose, Bld 206 (*)     All other components within normal limits  TROPONIN I  CBC  POCT PREGNANCY, URINE   Dg Chest 2 View  11/19/2011  *RADIOLOGY REPORT*  Clinical Data: Facial  numbness, chest pain.  CHEST - 2 VIEW  Comparison: 11/01/2009  Findings: Heart and mediastinal contours are within normal limits. No focal opacities or effusions.  No acute bony abnormality. Low lung volumes.  IMPRESSION: No active cardiopulmonary disease.   Original Report Authenticated By: Cyndie Chime, M.D.    Ct Head Wo Contrast  11/19/2011  **ADDENDUM** CREATED: 11/19/2011 12:41:01  Critical Value/emergent results were called by telephone at the time of interpretation on 11/19/2011 at 12:42 p.m. to Dr. Rubin Payor, who verbally acknowledged these results.  **END ADDENDUM** SIGNED BY: Viviann Spare  Eliezer Bottom, M.D.   11/19/2011  *RADIOLOGY REPORT*  Clinical Data: Headache.  Left facial parasthesias.  CT HEAD WITHOUT CONTRAST  Technique:  Contiguous axial images were obtained from the base of the skull through the vertex without contrast.  Comparison: None.  Findings: No intracranial hemorrhage.  The basilar tip appears slightly dense.  This may be normal. Thrombus not entirely excluded in the proper clinical setting. Otherwise no CT evidence of large acute infarct.  No intracranial mass lesion detected on this unenhanced exam.  Prominent calcification of the falx.  IMPRESSION: No intracranial hemorrhage.  The basilar tip appears slightly dense.  This may be normal. Thrombus not entirely excluded in the proper clinical setting. Otherwise no CT evidence of large acute infarct.   Original Report Authenticated By: Fuller Canada, M.D.      1. Chest pain   2. Paresthesia      Date: 11/19/2011  Rate: 69  Rhythm: normal sinus rhythm  QRS Axis: right  Intervals: normal  ST/T Wave abnormalities: nonspecific T wave changes  Conduction Disutrbances:none  Narrative Interpretation: Now right axis deviation. T wave changes looks similar to previous  Old EKG Reviewed: changes noted    MDM  Patient with episode of chest pain. EKG laboratory reassuring. Also questionable paresthesias to face. No droop. With  movement. Negative head CT patient will followup with neurology and primary care as needed.        Juliet Rude. Rubin Payor, MD 11/19/11 1530

## 2011-11-19 NOTE — ED Notes (Signed)
RN to obtain labs with start of IV 

## 2012-09-28 ENCOUNTER — Institutional Professional Consult (permissible substitution): Payer: Self-pay | Admitting: Cardiology

## 2013-05-07 ENCOUNTER — Other Ambulatory Visit (HOSPITAL_COMMUNITY): Payer: Self-pay | Admitting: Family Medicine

## 2013-05-07 DIAGNOSIS — Z1231 Encounter for screening mammogram for malignant neoplasm of breast: Secondary | ICD-10-CM

## 2013-05-11 ENCOUNTER — Ambulatory Visit (HOSPITAL_COMMUNITY)
Admission: RE | Admit: 2013-05-11 | Discharge: 2013-05-11 | Disposition: A | Discharge status: Home / Self Care | Payer: 59 | Source: Ambulatory Visit | Attending: Family Medicine | Admitting: Family Medicine

## 2013-05-11 DIAGNOSIS — Z1231 Encounter for screening mammogram for malignant neoplasm of breast: Secondary | ICD-10-CM | POA: Insufficient documentation

## 2013-06-13 ENCOUNTER — Encounter: Payer: Self-pay | Admitting: Family Medicine

## 2013-06-13 ENCOUNTER — Encounter: Payer: Self-pay | Admitting: Radiology

## 2013-06-13 ENCOUNTER — Ambulatory Visit (INDEPENDENT_AMBULATORY_CARE_PROVIDER_SITE_OTHER): Payer: 59 | Admitting: Family Medicine

## 2013-06-13 ENCOUNTER — Other Ambulatory Visit: Payer: Self-pay | Admitting: Radiology

## 2013-06-13 VITALS — BP 124/76 | HR 99 | Temp 98.7°F | Resp 17 | Ht 62.5 in | Wt 213.0 lb

## 2013-06-13 DIAGNOSIS — Z1211 Encounter for screening for malignant neoplasm of colon: Secondary | ICD-10-CM

## 2013-06-13 DIAGNOSIS — H8109 Meniere's disease, unspecified ear: Secondary | ICD-10-CM

## 2013-06-13 DIAGNOSIS — I10 Essential (primary) hypertension: Secondary | ICD-10-CM

## 2013-06-13 DIAGNOSIS — Z1212 Encounter for screening for malignant neoplasm of rectum: Secondary | ICD-10-CM

## 2013-06-13 DIAGNOSIS — E119 Type 2 diabetes mellitus without complications: Secondary | ICD-10-CM

## 2013-06-13 DIAGNOSIS — E049 Nontoxic goiter, unspecified: Secondary | ICD-10-CM | POA: Insufficient documentation

## 2013-06-13 DIAGNOSIS — E669 Obesity, unspecified: Secondary | ICD-10-CM

## 2013-06-13 LAB — POCT CBC
Granulocyte percent: 49.3 %G (ref 37–80)
HCT, POC: 44 % (ref 37.7–47.9)
Hemoglobin: 13.9 g/dL (ref 12.2–16.2)
Lymph, poc: 2.5 (ref 0.6–3.4)
MCH, POC: 28.5 pg (ref 27–31.2)
MCHC: 31.6 g/dL — AB (ref 31.8–35.4)
MCV: 90.4 fL (ref 80–97)
MID (cbc): 0.5 (ref 0–0.9)
MPV: 9 fL (ref 0–99.8)
POC Granulocyte: 2.9 (ref 2–6.9)
POC LYMPH PERCENT: 42.6 %L (ref 10–50)
POC MID %: 8.1 %M (ref 0–12)
Platelet Count, POC: 278 10*3/uL (ref 142–424)
RBC: 4.87 M/uL (ref 4.04–5.48)
RDW, POC: 14.5 %
WBC: 5.8 10*3/uL (ref 4.6–10.2)

## 2013-06-13 LAB — COMPREHENSIVE METABOLIC PANEL
ALT: 13 U/L (ref 0–35)
AST: 11 U/L (ref 0–37)
Albumin: 4.1 g/dL (ref 3.5–5.2)
Alkaline Phosphatase: 75 U/L (ref 39–117)
BUN: 13 mg/dL (ref 6–23)
CO2: 28 mEq/L (ref 19–32)
Calcium: 9 mg/dL (ref 8.4–10.5)
Chloride: 103 mEq/L (ref 96–112)
Creat: 0.66 mg/dL (ref 0.50–1.10)
Glucose, Bld: 267 mg/dL — ABNORMAL HIGH (ref 70–99)
Potassium: 3.9 mEq/L (ref 3.5–5.3)
Sodium: 140 mEq/L (ref 135–145)
Total Bilirubin: 0.8 mg/dL (ref 0.2–1.2)
Total Protein: 6.8 g/dL (ref 6.0–8.3)

## 2013-06-13 LAB — GLUCOSE, POCT (MANUAL RESULT ENTRY): POC Glucose: 277 mg/dl — AB (ref 70–99)

## 2013-06-13 LAB — TSH: TSH: 0.601 u[IU]/mL (ref 0.350–4.500)

## 2013-06-13 LAB — POCT GLYCOSYLATED HEMOGLOBIN (HGB A1C): Hemoglobin A1C: 7.5

## 2013-06-13 NOTE — Progress Notes (Signed)
[facility] [address], [place] [address] [phone]  Date:  06/13/2013   Name:  Sherry [name]   DOB:  [birth year]   MRN:  [id number]  PCP:  PROVIDER NOT IN SYSTEM    Chief Complaint: Annual Exam and Hypoglycemia   History of Present Illness:  Sherry [name] is a 63 y.o. very pleasant female patient who presents with the following:  Here today as a new patient.  She had been seen at [facility] in [facility] but wishes to change to [place] as she now has insurance. She had requested a CPE today but it turns out she has other issues that we need to handle today.  She has DM and uses insulin.  She notes that she may get down to "49"  in the morning some of the time, but she has not had strips recently.  She did not take her lantus last night.  On the way to clinic today she felt sweaty, weak, and like her glucose was low.   She is currently using 50 units and BID metformin 500 mg.  Her last A1c was 4 or 5 months ago.   Wants referral to endocrinologist, both for her DM and her thyroid issues.  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 Offered surgery at the time but declined at that time Now has difficulty swallowing and is ready to consider an operation.   Has menierre's more symptomatic and requests referral to ENT. Suffered an injury in a fall nearly two years ago.  Had rotator cuff surgery but claims she has trouble with meniscus injury in the left knee that causes her knee to "buckle" and makes her scared about going anywhere.   Colonoscopy age 16.  Needs follow up procedure. meds refill. Denies other complaint or health concern today.  She does have a history of CHF.  She had been seeing cardiology but her doctor moved away and she did not follow- up.  She has not seen cardiology since last year.   She also has a history of TIA  There are no active problems to display for this patient.   Past Medical History   Diagnosis Date  . Diabetes mellitus   . Hypertension   . GERD (gastroesophageal reflux disease)   . Dyslipidemia   . Mitral valve prolapse occasional palpitations w/ chest discomfort  . DJD (degenerative joint disease)   . Meniere's disease   . Rotator cuff tear, left   . History of CHF (congestive heart failure) 2010-  RESOLVED  . H/O hiatal hernia   . Complication of anesthesia HYPOTENSION INTRAOP W/ HYSTERECTOMY IN 1987--  DID OK W/ TOTAL KNEE IN 2008  . OA (osteoarthritis)   . History of peptic ulcer YRS AGO    Past Surgical History  Procedure Laterality Date  . Total knee arthroplasty  09-12-2006  [facility]    RIGHT KNEE  . Vaginal hysterectomy  1987  . Bunionectomy  2007    LEFT FOOT  . Tympanoplasty  1990;   1980  . Left shoulder surgery  1997    ROTATOR CUFF REPAIR  . Tubal ligation  1976  . Shoulder arthroscopy  09/20/2011    Procedure: ARTHROSCOPY SHOULDER;  Surgeon: Johnn Hai, MD;  Location: [facility];  Service: Orthopedics;  Laterality: Left;  SAD AND MINI OPEN ROTATOR CUFF REPAIR      History  Substance Use Topics  .  Smoking status: Never Smoker   . Smokeless tobacco: Never Used  . Alcohol Use: No    Family History  Problem Relation Age of Onset  . Heart disease Mother   . Diabetes Mother   . Kidney disease Mother     Allergies  Allergen Reactions  . Influenza Vaccines Anaphylaxis  . Codeine Itching  . Demerol Other (See Comments)    SEIZURE  . Nsaids Other (See Comments)    NAPROXEN, IBUPROFEN, SKELAXIN---  CAUSES PALPITATIONS    Medication list has been reviewed and updated.  Current Outpatient Prescriptions on File Prior to Visit  Medication Sig Dispense Refill  . albuterol (PROVENTIL HFA;VENTOLIN HFA) 108 (90 BASE) MCG/ACT inhaler Inhale 2 puffs into the lungs every 6 (six) hours as needed. For shortness of breath.      Marland Kitchen albuterol (PROVENTIL) (2.5 MG/3ML) 0.083% nebulizer solution Take 2.5 mg by nebulization every 6  (six) hours as needed. For shortness of breath.      Marland Kitchen aspirin EC 81 MG tablet Take 81 mg by mouth daily.      Marland Kitchen docusate sodium (COLACE) 100 MG capsule Take 100 mg by mouth daily.      . furosemide (LASIX) 40 MG tablet Take 40 mg by mouth daily.       Marland Kitchen gabapentin (NEURONTIN) 300 MG capsule Take 300 mg by mouth 2 (two) times daily.      . insulin glargine (LANTUS) 100 UNIT/ML injection Inject 60 Units into the skin at bedtime.       Marland Kitchen lisinopril (PRINIVIL,ZESTRIL) 20 MG tablet Take 20 mg by mouth daily.      . metFORMIN (GLUCOPHAGE) 500 MG tablet Take 500 mg by mouth 2 (two) times daily with a meal.      . methocarbamol (ROBAXIN) 500 MG tablet Take 500 mg by mouth daily as needed. For muscle spasms.      . metoprolol tartrate (LOPRESSOR) 25 MG tablet Take 25 mg by mouth 2 (two) times daily.      Marland Kitchen oxyCODONE-acetaminophen (PERCOCET) 7.5-325 MG per tablet Take 1 tablet by mouth every 8 (eight) hours as needed. For pain.      Marland Kitchen sertraline (ZOLOFT) 25 MG tablet Take 25 mg by mouth at bedtime.       No current facility-administered medications on file prior to visit.    Review of Systems:  As per HPI, otherwise negative.    Physical Examination: Filed Vitals:   06/13/13 0812  BP: 124/76  Pulse: 99  Temp: 98.7 F (37.1 C)  Resp: 17   Filed Vitals:   06/13/13 0812  Height: 5' 2.5" (1.588 m)  Weight: 213 lb (96.616 kg)   Body mass index is 38.31 kg/(m^2). Ideal Body Weight: Weight in (lb) to have BMI = 25: 138.6  GEN: WDWN, NAD, Non-toxic, A & O x 3, obese, looks well HEENT: Atraumatic, Normocephalic. Neck supple. No masses, No LAD. Ears and Nose: No external deformity. CV: RRR, No M/G/R. No JVD. No thrill. No extra heart sounds. PULM: CTA B, no wheezes, crackles, rhonchi. No retractions. No resp. distress. No accessory muscle use. ABD: S, NT, ND. No rebound. No HSM. EXTR: No c/c/e NEURO Normal gait.  PSYCH: Normally interactive. Conversant. Not depressed or anxious appearing.   Calm demeanor.  Given a snack to eat in clinic.  Her daughter came in to be with her.    Results for orders placed in visit on 06/13/13  POCT CBC      Result Value  Ref Range   WBC 5.8  4.6 - 10.2 K/uL   Lymph, poc 2.5  0.6 - 3.4   POC LYMPH PERCENT 42.6  10 - 50 %L   MID (cbc) 0.5  0 - 0.9   POC MID % 8.1  0 - 12 %M   POC Granulocyte 2.9  2 - 6.9   Granulocyte percent 49.3  37 - 80 %G   RBC 4.87  4.04 - 5.48 M/uL   Hemoglobin 13.9  12.2 - 16.2 g/dL   HCT, POC 44.0  37.7 - 47.9 %   MCV 90.4  80 - 97 fL   MCH, POC 28.5  27 - 31.2 pg   MCHC 31.6 (*) 31.8 - 35.4 g/dL   RDW, POC 14.5     Platelet Count, POC 278  142 - 424 K/uL   MPV 9.0  0 - 99.8 fL  GLUCOSE, POCT (MANUAL RESULT ENTRY)      Result Value Ref Range   POC Glucose 277 (*) 70 - 99 mg/dl  POCT GLYCOSYLATED HEMOGLOBIN (HGB A1C)      Result Value Ref Range   Hemoglobin A1C 7.5      Assessment and Plan: Goiter - Plan: Ambulatory referral to Endocrinology, POCT CBC, TSH  Type II or unspecified type diabetes mellitus without mention of complication, not stated as uncontrolled - Plan: Ambulatory referral to Endocrinology, POCT glucose (manual entry), POCT glycosylated hemoglobin (Hb A1C), Comprehensive metabolic panel  Essential hypertension, benign - Plan: Comprehensive metabolic panel  Obesity, unspecified  Screening for colorectal cancer - Plan: Ambulatory referral to Gastroenterology  Meniere disease - Plan: Ambulatory referral to ENT  Anne Ng is here today to discuss a few issues.  Will refer to ENT, GI, and endocrinology. At this point she is having some low sugars, although apparently she is not always low.  Will have her decrease her lantus to 10 units and titrate back up as needed, and increase her metformin.  Would prefer to have her on max metformin and less insulin.  She will send me a list of glucose readings in one week, but will report any severe highs or lows sooner.  She does have some test strips available  at the pharmacy and plans to get these today.    See patient instructions for more details.  Will need to schedule a CPE at a later date   Signed,  Ellison Carwin, MD

## 2013-06-13 NOTE — Patient Instructions (Signed)
Cut your lantus down to 10 units a day- check your glucose in the morning prior to eating.  As long as your glucose is greater than 150 you can go up by 2 units every 2 days.  Over the course of the next 2 weeks go up on your metformin to 2 pills (1,000 mg) twice a day  Check your sugar first thing every morning and also once during the day- you can vary the time.  Please send me a list of your sugars in about one week.    Be sure to keep a source of sugar in your purse at all times- candy, glucose tablets or cake frosting are all good ideas.  Use this if you feel that your glucose is low.    Call your cardiology office- [facility] and schedule a follow-up [place], [id number]  [phone]  We will refer you to endocrinology, GI (for your colon) and ENT for your goiter.  I will be in touch with the rest of your labs

## 2013-06-23 ENCOUNTER — Encounter: Payer: Self-pay | Admitting: Family Medicine

## 2013-06-26 ENCOUNTER — Ambulatory Visit: Payer: Self-pay | Admitting: Internal Medicine

## 2013-06-26 DIAGNOSIS — Z0289 Encounter for other administrative examinations: Secondary | ICD-10-CM

## 2013-06-29 ENCOUNTER — Ambulatory Visit (INDEPENDENT_AMBULATORY_CARE_PROVIDER_SITE_OTHER): Payer: 59 | Admitting: Family Medicine

## 2013-06-29 VITALS — BP 100/60 | HR 61 | Temp 98.1°F | Resp 16 | Ht 62.5 in | Wt 213.0 lb

## 2013-06-29 DIAGNOSIS — Z111 Encounter for screening for respiratory tuberculosis: Secondary | ICD-10-CM

## 2013-06-29 NOTE — Progress Notes (Signed)
[facility] [address] [place] [address] [phone]  Date:  06/29/2013   Name:  Sherry [name]   DOB:  Nov 15, 1950   MRN:  [id number]  PCP:  PROVIDER NOT IN SYSTEM    Chief Complaint: PPD Placement   History of Present Illness:  Sherry [name] is a 63 y.o. 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  Her glucose is looking better- this am it was 158.  Lowest was in the 90s. She is using 15 units of lantus currently.   She did hear about her endocrinology appt, but she has to reschedule.    Patient Active Problem List   Diagnosis Date Noted  . Essential hypertension, benign 06/13/2013  . Type II or unspecified type diabetes mellitus without mention of complication, not stated as uncontrolled 06/13/2013  . Goiter 06/13/2013  . Obesity, unspecified 06/13/2013    Past Medical History  Diagnosis Date  . Diabetes mellitus   . Hypertension   . GERD (gastroesophageal reflux disease)   . Dyslipidemia   . Mitral valve prolapse occasional palpitations w/ chest discomfort  . DJD (degenerative joint disease)   . Meniere's disease   . Rotator cuff tear, left   . History of CHF (congestive heart failure) 2010-  RESOLVED  . H/O hiatal hernia   . Complication of anesthesia HYPOTENSION INTRAOP W/ HYSTERECTOMY IN 1987--  DID OK W/ TOTAL KNEE IN 2008  . OA (osteoarthritis)   . History of peptic ulcer YRS AGO    Past Surgical History  Procedure Laterality Date  . Total knee arthroplasty  09-12-2006  [facility]    RIGHT KNEE  . Vaginal hysterectomy  1987  . Bunionectomy  2007    LEFT FOOT  . Tympanoplasty  1990;   1980  . Left shoulder surgery  1997    ROTATOR CUFF REPAIR  . Tubal ligation  1976  . Shoulder arthroscopy  09/20/2011    Procedure: ARTHROSCOPY SHOULDER;  Surgeon: Johnn Hai, MD;  Location: [facility];  Service: Orthopedics;  Laterality: Left;  SAD AND MINI OPEN ROTATOR CUFF REPAIR      History  Substance Use Topics  . Smoking status: Never Smoker   . Smokeless tobacco: Never Used  . Alcohol Use: No    Family History  Problem Relation Age of Onset  . Heart disease Mother   . Diabetes Mother   . Kidney disease Mother     Allergies  Allergen Reactions  . Influenza Vaccines Anaphylaxis  . Codeine Itching  . Demerol Other (See Comments)    SEIZURE  . Nsaids Other (See Comments)    NAPROXEN, IBUPROFEN, SKELAXIN---  CAUSES PALPITATIONS    Medication list has been reviewed and updated.  Current Outpatient Prescriptions on File Prior to Visit  Medication Sig Dispense Refill  . albuterol (PROVENTIL HFA;VENTOLIN HFA) 108 (90 BASE) MCG/ACT inhaler Inhale 2 puffs into the lungs every 6 (six) hours as needed. For shortness of breath.      Marland Kitchen albuterol (PROVENTIL) (2.5 MG/3ML) 0.083% nebulizer solution Take 2.5 mg by nebulization every 6 (six) hours as needed. For shortness of breath.      Marland Kitchen aspirin 325 MG tablet Take 325 mg by mouth daily.      Marland Kitchen docusate sodium (COLACE) 100 MG capsule  Take 100 mg by mouth daily.      . furosemide (LASIX) 40 MG tablet Take 40 mg by mouth daily.       Marland Kitchen gabapentin (NEURONTIN) 300 MG capsule Take 300 mg by mouth 2 (two) times daily.      . insulin glargine (LANTUS) 100 UNIT/ML injection Inject 10 Units into the skin at bedtime.       Marland Kitchen lisinopril (PRINIVIL,ZESTRIL) 20 MG tablet Take 20 mg by mouth daily.      . metFORMIN (GLUCOPHAGE) 500 MG tablet Take 500 mg by mouth 2 (two) times daily with a meal.      . methocarbamol (ROBAXIN) 500 MG tablet Take 500 mg by mouth daily as needed. For muscle spasms.      . metoprolol tartrate (LOPRESSOR) 25 MG tablet Take 25 mg by mouth 2 (two) times daily.      Marland Kitchen oxyCODONE-acetaminophen (PERCOCET) 7.5-325 MG per tablet Take 1 tablet by mouth every 8 (eight) hours as needed. For pain.      Marland Kitchen  sertraline (ZOLOFT) 25 MG tablet Take 25 mg by mouth at bedtime.       No current facility-administered medications on file prior to visit.    Review of Systems:  As per HPI- otherwise negative.   Physical Examination: Filed Vitals:   06/29/13 1052  BP: 100/60  Pulse: 61  Temp: 98.1 F (36.7 C)  Resp: 16   Filed Vitals:   06/29/13 1052  Height: 5' 2.5" (1.588 m)  Weight: 213 lb (96.616 kg)   Body mass index is 38.31 kg/(m^2). Ideal Body Weight: Weight in (lb) to have BMI = 25: 138.6   GEN: WDWN, NAD, Non-toxic, Alert & Oriented x 3 HEENT: Atraumatic, Normocephalic.  Ears and Nose: No external deformity. EXTR: No clubbing/cyanosis/edema NEURO: Normal gait.  PSYCH: Normally interactive. Conversant. Not depressed or anxious appearing.  Calm demeanor.    Assessment and Plan: Screening for tuberculosis - Plan: TB Skin Test  Place a PPD today.  Discussed her glucose- she is no longer having lows. She will come and see [place] as needed but should be able to see endocrinology soon  Signed [name], MD

## 2013-06-29 NOTE — Progress Notes (Signed)
  Tuberculosis Risk Questionnaire  1. No Were you born outside the [place] in one of the following parts of the world: [place], [place], [place], [place] or Eastern [place]?    2. No Have you traveled outside the [place] and lived for more than one month in one of the following parts of the world: [place], [place], [place], [place] or [place]?    3. Yes  Do you have a compromised immune system such as from any of the following conditions:HIV/AIDS, organ or bone marrow transplantation, diabetes, immunosuppressive medicines (e.g. Prednisone, Remicaide), leukemia, lymphoma, cancer of the head or neck, gastrectomy or jejunal bypass, end-stage renal disease (on dialysis), or silicosis?     4. Yes  Have you ever or do you plan on working in: a residential care center, a health care facility, a jail or prison or homeless shelter?    5. No Have you ever: injected illegal drugs, used crack cocaine, lived in a homeless shelter  or been in jail or prison?     6. No Have you ever been exposed to anyone with infectious tuberculosis?    Tuberculosis Symptom Questionnaire  Do you currently have any of the following symptoms?  1. No Unexplained cough lasting more than 3 weeks?   2. No Unexplained fever lasting more than 3 weeks.   3. No Night Sweats (sweating that leaves the bedclothes and sheets wet)     4. No Shortness of Breath   5. No Chest Pain   6. No Unintentional weight loss    7. No Unexplained fatigue (very tired for no reason)   

## 2013-07-01 ENCOUNTER — Ambulatory Visit (INDEPENDENT_AMBULATORY_CARE_PROVIDER_SITE_OTHER): Payer: 59 | Admitting: Family Medicine

## 2013-07-01 DIAGNOSIS — Z111 Encounter for screening for respiratory tuberculosis: Secondary | ICD-10-CM

## 2013-07-01 LAB — TB SKIN TEST
Induration: 0 mm
TB Skin Test: NEGATIVE

## 2013-07-19 ENCOUNTER — Other Ambulatory Visit: Payer: Self-pay | Admitting: Family Medicine

## 2013-07-19 ENCOUNTER — Other Ambulatory Visit: Payer: Self-pay

## 2013-07-19 MED ORDER — METFORMIN HCL 500 MG PO TABS: 1000.0000 mg | ORAL_TABLET | Freq: Two times a day (BID) | ORAL | Status: DC

## 2013-07-19 NOTE — Telephone Encounter (Signed)
Sending in Rx per Dr Copland's inst's on 06/13/13 OV and 06/23/13 pt email encounters.

## 2013-08-06 ENCOUNTER — Encounter: Payer: Self-pay | Admitting: Gastroenterology

## 2013-08-10 ENCOUNTER — Other Ambulatory Visit: Payer: Self-pay | Admitting: Physician Assistant

## 2013-08-10 DIAGNOSIS — F3289 Other specified depressive episodes: Secondary | ICD-10-CM

## 2013-08-10 DIAGNOSIS — I10 Essential (primary) hypertension: Secondary | ICD-10-CM

## 2013-08-10 MED ORDER — LISINOPRIL 20 MG PO TABS: 20.0000 mg | ORAL_TABLET | Freq: Every day | ORAL | Status: DC

## 2013-08-10 MED ORDER — SERTRALINE HCL 25 MG PO TABS
25.0000 mg | ORAL_TABLET | Freq: Every day | ORAL | Status: DC
Start: 2013-08-10 — End: 2013-08-10

## 2013-08-10 MED ORDER — METFORMIN HCL 500 MG PO TABS: ORAL_TABLET | ORAL | Status: DC

## 2013-08-10 MED ORDER — FUROSEMIDE 40 MG PO TABS: 40.0000 mg | ORAL_TABLET | Freq: Every day | ORAL | Status: DC

## 2013-08-10 MED ORDER — METOPROLOL TARTRATE 25 MG PO TABS: 25.0000 mg | ORAL_TABLET | Freq: Two times a day (BID) | ORAL | Status: DC

## 2013-08-10 MED ORDER — GABAPENTIN 300 MG PO CAPS: 300.0000 mg | ORAL_CAPSULE | Freq: Two times a day (BID) | ORAL | Status: DC

## 2013-08-10 NOTE — Telephone Encounter (Signed)
Meds ordered this encounter  Medications  . gabapentin (NEURONTIN) 300 MG capsule    Sig: Take 1 capsule (300 mg total) by mouth 2 (two) times daily.    Dispense:  60 capsule    Refill:  1  . lisinopril (PRINIVIL,ZESTRIL) 20 MG tablet    Sig: Take 1 tablet (20 mg total) by mouth daily.    Dispense:  30 tablet    Refill:  1  . furosemide (LASIX) 40 MG tablet    Sig: Take 1 tablet (40 mg total) by mouth daily.    Dispense:  30 tablet    Refill:  1  . metoprolol tartrate (LOPRESSOR) 25 MG tablet    Sig: Take 1 tablet (25 mg total) by mouth 2 (two) times daily.    Dispense:  30 tablet    Refill:  1  . sertraline (ZOLOFT) 25 MG tablet    Sig: Take 1 tablet (25 mg total) by mouth at bedtime.    Dispense:  30 tablet    Refill:  1  . metFORMIN (GLUCOPHAGE) 500 MG tablet    Sig: TAKE 2 TABLETS BY MOUTH TWICE DAILY WITH MEALS    Dispense:  360 tablet    Refill:  0    **Patient requests 90 days supply**   Dr. Lorelei Pont added to Care Team as PCP.

## 2013-08-10 NOTE — Telephone Encounter (Signed)
Pt needs refills on these medications prescribed by her previous PCP. Pt states that she has designated Dr. Lorelei Pont as her PCP now.

## 2013-08-22 DIAGNOSIS — Z0271 Encounter for disability determination: Secondary | ICD-10-CM

## 2013-08-23 ENCOUNTER — Telehealth: Payer: Self-pay | Admitting: Family Medicine

## 2013-08-23 NOTE — Telephone Encounter (Signed)
LMOM to CB to make appt to get diabetes at goal.

## 2013-10-18 ENCOUNTER — Other Ambulatory Visit: Payer: Self-pay | Admitting: Physician Assistant

## 2013-10-19 ENCOUNTER — Other Ambulatory Visit: Payer: Self-pay | Admitting: Physician Assistant

## 2013-11-07 ENCOUNTER — Telehealth: Payer: Self-pay

## 2013-11-07 NOTE — Telephone Encounter (Signed)
Clld pt - advsd need to schedule Diabetic Care follow up appt. Pt stated she would call the office on 11/08/13 to schedule appt with Dr. Lorelei Pont.

## 2013-12-18 ENCOUNTER — Other Ambulatory Visit: Payer: Self-pay | Admitting: Family Medicine

## 2013-12-19 ENCOUNTER — Other Ambulatory Visit: Payer: Self-pay | Admitting: Family Medicine

## 2013-12-27 ENCOUNTER — Telehealth: Payer: Self-pay

## 2013-12-27 MED ORDER — METOPROLOL TARTRATE 25 MG PO TABS: 25.0000 mg | ORAL_TABLET | Freq: Two times a day (BID) | ORAL | Status: DC

## 2013-12-27 NOTE — Telephone Encounter (Signed)
Pt of Dr. Lorelei Pont requesting refill on metoprolol tartrate (LOPRESSOR) 25 MG tablet, states that [facility] on [address] has sent [place] a request, and has not gotten a response in the past couple of weeks. Please advise pt

## 2013-12-27 NOTE — Telephone Encounter (Signed)
We had received a req on 11/11 and responded same day w/refusal and note that said they should still have RF from 10/19/13 script. Called pharm to check status of RFs and he stated that she had used these RFs already bc we had only put #30 on that Rx so taking BID it was only lasting 15 days. I resent in #60 for 30 days and notified pt on VM, and also that she is due for f/up then.

## 2014-01-22 ENCOUNTER — Other Ambulatory Visit: Payer: Self-pay | Admitting: Physician Assistant

## 2014-02-09 ENCOUNTER — Ambulatory Visit (INDEPENDENT_AMBULATORY_CARE_PROVIDER_SITE_OTHER): Payer: 59 | Admitting: Family Medicine

## 2014-02-09 ENCOUNTER — Other Ambulatory Visit: Payer: Self-pay | Admitting: Family Medicine

## 2014-02-09 ENCOUNTER — Other Ambulatory Visit: Payer: Self-pay | Admitting: Physician Assistant

## 2014-02-09 ENCOUNTER — Ambulatory Visit (INDEPENDENT_AMBULATORY_CARE_PROVIDER_SITE_OTHER): Payer: 59

## 2014-02-09 VITALS — BP 134/76 | HR 85 | Temp 97.8°F | Resp 18 | Ht 61.5 in | Wt 208.0 lb

## 2014-02-09 DIAGNOSIS — R062 Wheezing: Secondary | ICD-10-CM

## 2014-02-09 DIAGNOSIS — R197 Diarrhea, unspecified: Secondary | ICD-10-CM

## 2014-02-09 DIAGNOSIS — R05 Cough: Secondary | ICD-10-CM

## 2014-02-09 DIAGNOSIS — F330 Major depressive disorder, recurrent, mild: Secondary | ICD-10-CM

## 2014-02-09 DIAGNOSIS — R059 Cough, unspecified: Secondary | ICD-10-CM

## 2014-02-09 DIAGNOSIS — E118 Type 2 diabetes mellitus with unspecified complications: Secondary | ICD-10-CM

## 2014-02-09 LAB — POCT CBC
Granulocyte percent: 33.6 %G — AB (ref 37–80)
HCT, POC: 47.6 % (ref 37.7–47.9)
Hemoglobin: 14.8 g/dL (ref 12.2–16.2)
Lymph, poc: 5.3 — AB (ref 0.6–3.4)
MCH, POC: 27.7 pg (ref 27–31.2)
MCHC: 31 g/dL — AB (ref 31.8–35.4)
MCV: 89.4 fL (ref 80–97)
MID (cbc): 0.6 (ref 0–0.9)
MPV: 7.7 fL (ref 0–99.8)
POC Granulocyte: 3 (ref 2–6.9)
POC LYMPH PERCENT: 59.9 %L — AB (ref 10–50)
POC MID %: 6.5 %M (ref 0–12)
Platelet Count, POC: 273 10*3/uL (ref 142–424)
RBC: 5.32 M/uL (ref 4.04–5.48)
RDW, POC: 15.4 %
WBC: 8.8 10*3/uL (ref 4.6–10.2)

## 2014-02-09 MED ORDER — PROMETHAZINE-DM 6.25-15 MG/5ML PO SYRP: 5.0000 mL | ORAL_SOLUTION | Freq: Four times a day (QID) | ORAL | Status: DC | PRN

## 2014-02-09 MED ORDER — ALBUTEROL SULFATE (2.5 MG/3ML) 0.083% IN NEBU: 2.5000 mg | INHALATION_SOLUTION | Freq: Four times a day (QID) | RESPIRATORY_TRACT | Status: DC | PRN

## 2014-02-09 MED ORDER — ALBUTEROL SULFATE (2.5 MG/3ML) 0.083% IN NEBU
2.5000 mg | INHALATION_SOLUTION | Freq: Once | RESPIRATORY_TRACT | Status: AC
  Administered 2014-02-09: 2.5 mg via RESPIRATORY_TRACT

## 2014-02-09 NOTE — Patient Instructions (Signed)
I will give you a call with the rest of your labs asap.  Use the promethazine DM syrup as needed for cough- remember it will make you feel sleepy.   Use your albuterol as needed for wheezing.   Let me know if you are getting worse!

## 2014-02-09 NOTE — Progress Notes (Signed)
[facility] [address], [place] [address] [phone]  Date:  02/09/2014   Name:  Sherry [name]   DOB:  24-Apr-1950   MRN:  [id number]  PCP:  [name], MD    Chief Complaint: Cough and Diarrhea   History of Present Illness:  Sherry [name] is a 64 y.o. very pleasant female patient who presents with the following:  She is here today with nausea, diarrhea, cough, wheezing.  She thinks she had a fever earlier in the illness but now resolved. She is having diarrhea when she eats or drinks. She will have stomach cramps and have to go to the bathroom.  However she is not vomiting. She is sticking to a bland diet.   The cough is non- productive.  She also has a runny nose and sneezing.   These sx have been present for about a week.    She is generally in good health.   She has tried some ibuprofen for this so far  She has felt hot and cold, sweaty.  She tried some proventil inhaler that she had at home and also some robitussin.  She would like to have a refill on her albuterol nebulizer as this seems to help her more than the inhaler  Patient Active Problem List   Diagnosis Date Noted  . Essential hypertension, benign 06/13/2013  . Type II or unspecified type diabetes mellitus without mention of complication, not stated as uncontrolled 06/13/2013  . Goiter 06/13/2013  . Obesity, unspecified 06/13/2013    Past Medical History  Diagnosis Date  . Diabetes mellitus   . Hypertension   . GERD (gastroesophageal reflux disease)   . Dyslipidemia   . Mitral valve prolapse occasional palpitations w/ chest discomfort  . DJD (degenerative joint disease)   . Meniere's disease   . Rotator cuff tear, left   . History of CHF (congestive heart failure) 2010-  RESOLVED  . H/O hiatal hernia   . Complication of anesthesia HYPOTENSION INTRAOP W/ HYSTERECTOMY IN 1987--  DID OK W/ TOTAL KNEE IN 2008  . OA (osteoarthritis)   . History of peptic ulcer YRS AGO     Past Surgical History  Procedure Laterality Date  . Total knee arthroplasty  09-12-2006  [facility]    RIGHT KNEE  . Vaginal hysterectomy  1987  . Bunionectomy  2007    LEFT FOOT  . Tympanoplasty  1990;   1980  . Left shoulder surgery  1997    ROTATOR CUFF REPAIR  . Tubal ligation  1976  . Shoulder arthroscopy  09/20/2011    Procedure: ARTHROSCOPY SHOULDER;  Surgeon: Johnn Hai, MD;  Location: [facility];  Service: Orthopedics;  Laterality: Left;  SAD AND MINI OPEN ROTATOR CUFF REPAIR      History  Substance Use Topics  . Smoking status: Never Smoker   . Smokeless tobacco: Never Used  . Alcohol Use: No    Family History  Problem Relation Age of Onset  . Heart disease Mother   . Diabetes Mother   . Kidney disease Mother     Allergies  Allergen Reactions  . Influenza Vaccines Anaphylaxis  . Codeine Itching  . Demerol Other (See Comments)    SEIZURE  . Nsaids Other (See Comments)    NAPROXEN, IBUPROFEN, SKELAXIN---  CAUSES PALPITATIONS    Medication list has been reviewed and updated.  Current Outpatient Prescriptions on File Prior to Visit  Medication Sig Dispense Refill  .  albuterol (PROVENTIL HFA;VENTOLIN HFA) 108 (90 BASE) MCG/ACT inhaler Inhale 2 puffs into the lungs every 6 (six) hours as needed. For shortness of breath.    Marland Kitchen albuterol (PROVENTIL) (2.5 MG/3ML) 0.083% nebulizer solution Take 2.5 mg by nebulization every 6 (six) hours as needed. For shortness of breath.    Marland Kitchen aspirin 325 MG tablet Take 325 mg by mouth daily.    . furosemide (LASIX) 40 MG tablet TAKE 1 TABLET BY MOUTH DAILY 90 tablet 1  . gabapentin (NEURONTIN) 300 MG capsule Take 1 capsule (300 mg total) by mouth 2 (two) times daily. NO MORE REFILLS WITHOUT OFFICE VISIT - 2ND NOTICE 30 capsule 0  . insulin glargine (LANTUS) 100 UNIT/ML injection Inject 10 Units into the skin at bedtime.     Marland Kitchen lisinopril (PRINIVIL,ZESTRIL) 20 MG tablet TAKE 1 TABLET BY MOUTH DAILY 90 tablet 1  .  metFORMIN (GLUCOPHAGE) 500 MG tablet Take 2 tablets (1,000 mg total) by mouth 2 (two) times daily with a meal. PATIENT NEEDS OFFICE VISIT FOR ADDITIONAL REFILLS 120 tablet 0  . methocarbamol (ROBAXIN) 500 MG tablet Take 500 mg by mouth daily as needed. For muscle spasms.    . metoprolol tartrate (LOPRESSOR) 25 MG tablet Take 1 tablet (25 mg total) by mouth 2 (two) times daily. PATIENT NEEDS OFFICE VISIT FOR ADDITIONAL REFILLS - due for follow up visit 60 tablet 0  . oxyCODONE-acetaminophen (PERCOCET) 7.5-325 MG per tablet Take 1 tablet by mouth every 8 (eight) hours as needed. For pain.    Marland Kitchen sertraline (ZOLOFT) 25 MG tablet TAKE 1 TABLET BY MOUTH AT BEDTIME 90 tablet 1  . docusate sodium (COLACE) 100 MG capsule Take 100 mg by mouth daily.     No current facility-administered medications on file prior to visit.    Review of Systems:  As per HPI- otherwise negative.   Physical Examination: Filed Vitals:   02/09/14 1555  BP: 134/76  Pulse: 85  Temp: 97.8 F (36.6 C)  Resp: 18   Filed Vitals:   02/09/14 1555  Height: 5' 1.5" (1.562 m)  Weight: 208 lb (94.348 kg)   Body mass index is 38.67 kg/(m^2). Ideal Body Weight: Weight in (lb) to have BMI = 25: 134.2  GEN: WDWN, NAD, Non-toxic, A & O x 3, obese, looks well HEENT: Atraumatic, Normocephalic. Neck supple. No masses, No LAD.  Bilateral TM wnl, oropharynx normal.  PEERL,EOMI.   Ears and Nose: No external deformity. CV: RRR, No M/G/R. No JVD. No thrill. No extra heart sounds. PULM: CTA B, no wheezes, crackles, rhonchi. No retractions. No resp. distress. No accessory muscle use. ABD: S, NT, ND, +BS. No rebound. No HSM. Benign exam EXTR: No c/c/e NEURO Normal gait.  PSYCH: Normally interactive. Conversant. Not depressed or anxious appearing.  Calm demeanor.   Given albuterol neb:  She felt a bit better  [facility] reading (PRIMARY) by  Dr. Lorelei Pont. CXR: negative CHEST 2 VIEW  COMPARISON: 11/19/2011  FINDINGS: The heart size  and mediastinal contours are within normal limits. Both lungs are clear. The visualized skeletal structures are unremarkable.  IMPRESSION: No active cardiopulmonary disease.   Results for orders placed or performed in visit on 02/09/14  POCT CBC  Result Value Ref Range   WBC 8.8 4.6 - 10.2 K/uL   Lymph, poc 5.3 (A) 0.6 - 3.4   POC LYMPH PERCENT 59.9 (A) 10 - 50 %L   MID (cbc) 0.6 0 - 0.9   POC MID % 6.5 0 - 12 %M  POC Granulocyte 3.0 2 - 6.9   Granulocyte percent 33.6 (A) 37 - 80 %G   RBC 5.32 4.04 - 5.48 M/uL   Hemoglobin 14.8 12.2 - 16.2 g/dL   HCT, POC 47.6 37.7 - 47.9 %   MCV 89.4 80 - 97 fL   MCH, POC 27.7 27 - 31.2 pg   MCHC 31.0 (A) 31.8 - 35.4 g/dL   RDW, POC 15.4 %   Platelet Count, POC 273 142 - 424 K/uL   MPV 7.7 0 - 99.8 fL    Assessment and Plan: Cough - Plan: DG Chest 2 View, promethazine-dextromethorphan (PROMETHAZINE-DM) 6.25-15 MG/5ML syrup, POCT CBC  Diarrhea - Plan: Comprehensive metabolic panel  Wheezing - Plan: albuterol (PROVENTIL) (2.5 MG/3ML) 0.083% nebulizer solution 2.5 mg, albuterol (PROVENTIL) (2.5 MG/3ML) 0.083% nebulizer solution  Diabetes mellitus type 2 with complications - Plan: Hemoglobin A1c  Likely viral syndrome with cough and diarrhea She is very bothered by cough but cannot take codeine, etc.  Will try promethazine DM as needed, refill her albuterol neb ampules. Await further labs and will be in touch with her.    Signed [name], MD  Meds ordered this encounter  Medications  . albuterol (PROVENTIL) (2.5 MG/3ML) 0.083% nebulizer solution 2.5 mg    Sig:   . promethazine-dextromethorphan (PROMETHAZINE-DM) 6.25-15 MG/5ML syrup    Sig: Take 5 mLs by mouth 4 (four) times daily as needed for cough.    Dispense:  120 mL    Refill:  0  . albuterol (PROVENTIL) (2.5 MG/3ML) 0.083% nebulizer solution    Sig: Take 3 mLs (2.5 mg total) by nebulization every 6 (six) hours as needed. For shortness of breath.    Dispense:  75 mL     Refill:  1

## 2014-02-10 LAB — HEMOGLOBIN A1C
Hgb A1c MFr Bld: 8.5 % — ABNORMAL HIGH (ref <5.7)
Mean Plasma Glucose: 197 mg/dL — ABNORMAL HIGH (ref <117)

## 2014-02-10 LAB — COMPREHENSIVE METABOLIC PANEL
ALT: 14 U/L (ref 0–35)
AST: 13 U/L (ref 0–37)
Albumin: 4 g/dL (ref 3.5–5.2)
Alkaline Phosphatase: 75 U/L (ref 39–117)
BUN: 17 mg/dL (ref 6–23)
CO2: 28 mEq/L (ref 19–32)
Calcium: 9.4 mg/dL (ref 8.4–10.5)
Chloride: 103 mEq/L (ref 96–112)
Creat: 0.86 mg/dL (ref 0.50–1.10)
Glucose, Bld: 171 mg/dL — ABNORMAL HIGH (ref 70–99)
Potassium: 4.4 mEq/L (ref 3.5–5.3)
Sodium: 141 mEq/L (ref 135–145)
Total Bilirubin: 0.3 mg/dL (ref 0.2–1.2)
Total Protein: 7 g/dL (ref 6.0–8.3)

## 2014-02-11 NOTE — Telephone Encounter (Signed)
Dr Lorelei Pont, you just saw pt Sat, but not for this. Do you want to give RFs?

## 2014-02-12 ENCOUNTER — Other Ambulatory Visit: Payer: Self-pay | Admitting: Physician Assistant

## 2014-02-13 NOTE — Telephone Encounter (Signed)
Dr Lorelei Pont, you just saw pt for acute issue and DM but don't see this med discussed. We have been putting messages on RFs last couple of months to RTC. Can we give RFs on this?

## 2014-02-14 ENCOUNTER — Emergency Department (HOSPITAL_COMMUNITY)
Admission: EM | Admit: 2014-02-14 | Discharge: 2014-02-14 | Disposition: A | Discharge status: Home / Self Care | Payer: 59 | Attending: Emergency Medicine | Admitting: Emergency Medicine

## 2014-02-14 ENCOUNTER — Encounter (HOSPITAL_COMMUNITY): Payer: Self-pay | Admitting: Emergency Medicine

## 2014-02-14 ENCOUNTER — Encounter: Payer: Self-pay | Admitting: Family Medicine

## 2014-02-14 DIAGNOSIS — M1990 Unspecified osteoarthritis, unspecified site: Secondary | ICD-10-CM | POA: Insufficient documentation

## 2014-02-14 DIAGNOSIS — I509 Heart failure, unspecified: Secondary | ICD-10-CM | POA: Insufficient documentation

## 2014-02-14 DIAGNOSIS — Z794 Long term (current) use of insulin: Secondary | ICD-10-CM | POA: Insufficient documentation

## 2014-02-14 DIAGNOSIS — Z0100 Encounter for examination of eyes and vision without abnormal findings: Secondary | ICD-10-CM

## 2014-02-14 DIAGNOSIS — I10 Essential (primary) hypertension: Secondary | ICD-10-CM | POA: Insufficient documentation

## 2014-02-14 DIAGNOSIS — R05 Cough: Secondary | ICD-10-CM | POA: Insufficient documentation

## 2014-02-14 DIAGNOSIS — Z79899 Other long term (current) drug therapy: Secondary | ICD-10-CM | POA: Diagnosis not present

## 2014-02-14 DIAGNOSIS — E1165 Type 2 diabetes mellitus with hyperglycemia: Secondary | ICD-10-CM | POA: Insufficient documentation

## 2014-02-14 DIAGNOSIS — Z8719 Personal history of other diseases of the digestive system: Secondary | ICD-10-CM | POA: Insufficient documentation

## 2014-02-14 DIAGNOSIS — Z7982 Long term (current) use of aspirin: Secondary | ICD-10-CM | POA: Insufficient documentation

## 2014-02-14 DIAGNOSIS — R739 Hyperglycemia, unspecified: Secondary | ICD-10-CM

## 2014-02-14 LAB — URINALYSIS, ROUTINE W REFLEX MICROSCOPIC
Bilirubin Urine: NEGATIVE
Glucose, UA: 250 mg/dL — AB
Hgb urine dipstick: NEGATIVE
Ketones, ur: NEGATIVE mg/dL
Leukocytes, UA: NEGATIVE
Nitrite: NEGATIVE
Protein, ur: NEGATIVE mg/dL
Specific Gravity, Urine: 1.024 (ref 1.005–1.030)
Urobilinogen, UA: 0.2 mg/dL (ref 0.0–1.0)
pH: 5.5 (ref 5.0–8.0)

## 2014-02-14 LAB — CBC WITH DIFFERENTIAL/PLATELET
Basophils Absolute: 0 10*3/uL (ref 0.0–0.1)
Basophils Relative: 0 % (ref 0–1)
Eosinophils Absolute: 0 10*3/uL (ref 0.0–0.7)
Eosinophils Relative: 0 % (ref 0–5)
HCT: 41.6 % (ref 36.0–46.0)
Hemoglobin: 13.2 g/dL (ref 12.0–15.0)
Lymphocytes Relative: 23 % (ref 12–46)
Lymphs Abs: 3.2 10*3/uL (ref 0.7–4.0)
MCH: 28.3 pg (ref 26.0–34.0)
MCHC: 31.7 g/dL (ref 30.0–36.0)
MCV: 89.3 fL (ref 78.0–100.0)
Monocytes Absolute: 0.5 10*3/uL (ref 0.1–1.0)
Monocytes Relative: 4 % (ref 3–12)
Neutro Abs: 10.3 10*3/uL — ABNORMAL HIGH (ref 1.7–7.7)
Neutrophils Relative %: 73 % (ref 43–77)
Platelets: 292 10*3/uL (ref 150–400)
RBC: 4.66 MIL/uL (ref 3.87–5.11)
RDW: 13.8 % (ref 11.5–15.5)
WBC: 14.1 10*3/uL — ABNORMAL HIGH (ref 4.0–10.5)

## 2014-02-14 LAB — BASIC METABOLIC PANEL
Anion gap: 7 (ref 5–15)
BUN: 21 mg/dL (ref 6–23)
CO2: 26 mmol/L (ref 19–32)
Calcium: 8.7 mg/dL (ref 8.4–10.5)
Chloride: 104 mEq/L (ref 96–112)
Creatinine, Ser: 0.77 mg/dL (ref 0.50–1.10)
GFR calc Af Amer: >90 mL/min (ref >90)
GFR calc non Af Amer: 88 mL/min — ABNORMAL LOW (ref >90)
Glucose, Bld: 274 mg/dL — ABNORMAL HIGH (ref 70–99)
Potassium: 3.9 mmol/L (ref 3.5–5.1)
Sodium: 137 mmol/L (ref 135–145)

## 2014-02-14 LAB — CBG MONITORING, ED: Glucose-Capillary: 278 mg/dL — ABNORMAL HIGH (ref 70–99)

## 2014-02-14 MED ORDER — SODIUM CHLORIDE 0.9 % IV BOLUS (SEPSIS)
1000.0000 mL | Freq: Once | INTRAVENOUS | Status: AC
  Administered 2014-02-14: 1000 mL via INTRAVENOUS

## 2014-02-14 MED ORDER — ACETAMINOPHEN 325 MG PO TABS
650.0000 mg | ORAL_TABLET | Freq: Once | ORAL | Status: AC
  Administered 2014-02-14: 650 mg via ORAL
  Filled 2014-02-14: qty 2

## 2014-02-14 MED ORDER — ONDANSETRON HCL 4 MG/2ML IJ SOLN
4.0000 mg | Freq: Once | INTRAMUSCULAR | Status: AC
  Administered 2014-02-14: 4 mg via INTRAVENOUS
  Filled 2014-02-14: qty 2

## 2014-02-14 NOTE — ED Provider Notes (Signed)
CSN: [id number]     Arrival date & time 02/14/14  1849 History   First MD Initiated Contact with Patient 02/14/14 1857     Chief Complaint  Patient presents with  . Hyperglycemia     (Consider location/radiation/quality/duration/timing/severity/associated sxs/prior Treatment) HPI  64 year old female presents with hyperglycemia. She states that she's been agitated and angry today and then noticed a headache and fatigue while shopping. This happened her before when she had hyperglycemia. She fell and injured her shoulder when dogs jumped on her at work 4 days ago. She went to urgent care and they prescribed her steroids to help with the inflammation. She first took the steroids yesterday and is taken 3 total doses. Patient is also been having cough and "bronchitis" for the past 2 weeks. This feels better currently. She denies a current shortness of breath. Was given IV fluids by EMS, now feels headache is better and less agitated. Mild nausea.  Past Medical History  Diagnosis Date  . Diabetes mellitus   . Hypertension   . GERD (gastroesophageal reflux disease)   . Dyslipidemia   . Mitral valve prolapse occasional palpitations w/ chest discomfort  . DJD (degenerative joint disease)   . Meniere's disease   . Rotator cuff tear, left   . History of CHF (congestive heart failure) 2010-  RESOLVED  . H/O hiatal hernia   . Complication of anesthesia HYPOTENSION INTRAOP W/ HYSTERECTOMY IN 1987--  DID OK W/ TOTAL KNEE IN 2008  . OA (osteoarthritis)   . History of peptic ulcer YRS AGO   Past Surgical History  Procedure Laterality Date  . Total knee arthroplasty  09-12-2006  [facility]    RIGHT KNEE  . Vaginal hysterectomy  1987  . Bunionectomy  2007    LEFT FOOT  . Tympanoplasty  1990;   1980  . Left shoulder surgery  1997    ROTATOR CUFF REPAIR  . Tubal ligation  1976  . Shoulder arthroscopy  09/20/2011    Procedure: ARTHROSCOPY SHOULDER;  Surgeon: Johnn Hai, MD;  Location: [facility];  Service: Orthopedics;  Laterality: Left;  SAD AND MINI OPEN ROTATOR CUFF REPAIR     Family History  Problem Relation Age of Onset  . Heart disease Mother   . Diabetes Mother   . Kidney disease Mother    History  Substance Use Topics  . Smoking status: Never Smoker   . Smokeless tobacco: Never Used  . Alcohol Use: No   OB History    No data available     Review of Systems  Constitutional: Positive for fatigue. Negative for fever.  Respiratory: Positive for cough. Negative for shortness of breath.   Cardiovascular: Negative for chest pain.  Gastrointestinal: Positive for nausea. Negative for vomiting and abdominal pain.  Neurological: Positive for headaches.  All other systems reviewed and are negative.     Allergies  Influenza vaccines; Codeine; Demerol; and Nsaids  Home Medications   Prior to Admission medications   Medication Sig Start Date End Date Taking? Authorizing Provider  albuterol (PROVENTIL HFA;VENTOLIN HFA) 108 (90 BASE) MCG/ACT inhaler Inhale 2 puffs into the lungs every 6 (six) hours as needed. For shortness of breath.   Yes Historical Provider, MD  albuterol (PROVENTIL) (2.5 MG/3ML) 0.083% nebulizer solution Take 3 mLs (2.5 mg total) by nebulization every 6 (six) hours as needed. For shortness of breath. 02/09/14  Yes Gay Filler Copland, MD  aspirin 325 MG tablet Take 325 mg by mouth daily.  Yes Historical Provider, MD  docusate sodium (COLACE) 100 MG capsule Take 100 mg by mouth daily.   Yes Historical Provider, MD  furosemide (LASIX) 40 MG tablet TAKE 1 TABLET BY MOUTH DAILY   Yes Jessica C Copland, MD  gabapentin (NEURONTIN) 300 MG capsule Take 1 capsule (300 mg total) by mouth 2 (two) times daily. 02/13/14  Yes Gay Filler Copland, MD  insulin glargine (LANTUS) 100 UNIT/ML injection Inject 40 Units into the skin daily.    Yes Historical Provider, MD  lisinopril (PRINIVIL,ZESTRIL) 20 MG tablet TAKE 1 TABLET BY MOUTH DAILY   Yes Jessica C  Copland, MD  metFORMIN (GLUCOPHAGE) 500 MG tablet Take 2 tablets (1,000 mg total) by mouth 2 (two) times daily with a meal. PATIENT NEEDS OFFICE VISIT FOR ADDITIONAL REFILLS 12/19/13  Yes Chelle S Jeffery, PA-C  metoprolol tartrate (LOPRESSOR) 25 MG tablet TAKE 1 TABLET BY MOUTH TWICE DAILY( NEEDS OFFICE VISIT) 02/11/14  Yes Gay Filler Copland, MD  promethazine-dextromethorphan (PROMETHAZINE-DM) 6.25-15 MG/5ML syrup Take 5 mLs by mouth 4 (four) times daily as needed for cough. 02/09/14  Yes Gay Filler Copland, MD  sertraline (ZOLOFT) 25 MG tablet TAKE 1 TABLET BY MOUTH AT BEDTIME   Yes Jessica C Copland, MD  sertraline (ZOLOFT) 25 MG tablet TAKE 1 TABLET BY MOUTH AT BEDTIME 02/11/14   Jessica C Copland, MD   BP 150/91 mmHg  Pulse 72  Temp(Src) 98 F (36.7 C) (Oral)  Resp 20  SpO2 100% Physical Exam  Constitutional: She is oriented to person, place, and time. She appears well-developed and well-nourished.  HENT:  Head: Normocephalic and atraumatic.  Right Ear: External ear normal.  Left Ear: External ear normal.  Nose: Nose normal.  Eyes: EOM are normal. Pupils are equal, round, and reactive to light. Right eye exhibits no discharge. Left eye exhibits no discharge.  Cardiovascular: Normal rate, regular rhythm and normal heart sounds.   Pulmonary/Chest: Effort normal and breath sounds normal.  Abdominal: Soft. There is no tenderness.  Neurological: She is alert and oriented to person, place, and time.  CN 2-12 grossly intact. 5/5 strength in all 4 extremities.  Skin: Skin is warm and dry.  Vitals reviewed.   ED Course  Procedures (including critical care time) Labs Review Labs Reviewed  CBC WITH DIFFERENTIAL - Abnormal; Notable for the following:    WBC 14.1 (*)    Neutro Abs 10.3 (*)    All other components within normal limits  URINALYSIS, ROUTINE W REFLEX MICROSCOPIC - Abnormal; Notable for the following:    Glucose, UA 250 (*)    All other components within normal limits  BASIC  METABOLIC PANEL - Abnormal; Notable for the following:    Glucose, Bld 274 (*)    GFR calc non Af Amer 88 (*)    All other components within normal limits  CBG MONITORING, ED - Abnormal; Notable for the following:    Glucose-Capillary 278 (*)    All other components within normal limits    Imaging Review No results found.   EKG Interpretation None      MDM   Final diagnoses:  Hyperglycemia    Patient's headache and fatigue improved with fluids and her glucose coming down. Feels improved. No evidence of acidosis or ketones in urine. Likely exacerbated from her new steroid use. She will stop this, and will follow closely with PCP.     Ephraim Hamburger, MD 02/15/14 [phone]

## 2014-02-14 NOTE — ED Notes (Signed)
Bed: [facility] Expected date:  Expected time:  Means of arrival:  Comments: EMS-hyperglycemia 

## 2014-02-14 NOTE — ED Notes (Signed)
Per EMS: pt c/o hyperglycemia, and confusion, pt has 18 g in left AC, 400 NS given in route. CBG 326 before NS.

## 2014-02-14 NOTE — Discharge Instructions (Signed)

## 2014-02-27 ENCOUNTER — Other Ambulatory Visit: Payer: Self-pay | Admitting: Family Medicine

## 2014-02-27 ENCOUNTER — Encounter: Payer: Self-pay | Admitting: Family Medicine

## 2014-02-27 ENCOUNTER — Ambulatory Visit (INDEPENDENT_AMBULATORY_CARE_PROVIDER_SITE_OTHER): Payer: 59 | Admitting: Family Medicine

## 2014-02-27 VITALS — BP 128/72 | HR 76 | Temp 98.1°F | Resp 18 | Ht 63.0 in | Wt 212.0 lb

## 2014-02-27 DIAGNOSIS — E669 Obesity, unspecified: Secondary | ICD-10-CM

## 2014-02-27 DIAGNOSIS — E1169 Type 2 diabetes mellitus with other specified complication: Secondary | ICD-10-CM

## 2014-02-27 DIAGNOSIS — M25512 Pain in left shoulder: Secondary | ICD-10-CM

## 2014-02-27 DIAGNOSIS — E119 Type 2 diabetes mellitus without complications: Secondary | ICD-10-CM

## 2014-02-27 MED ORDER — HYDROCODONE-ACETAMINOPHEN 5-325 MG PO TABS: 1.0000 | ORAL_TABLET | Freq: Three times a day (TID) | ORAL | Status: DC | PRN

## 2014-02-27 MED ORDER — LIRAGLUTIDE 18 MG/3ML ~~LOC~~ SOPN: PEN_INJECTOR | SUBCUTANEOUS | Status: DC

## 2014-02-27 MED ORDER — INSULIN PEN NEEDLE 31G X 6 MM MISC: Status: DC

## 2014-02-27 NOTE — Patient Instructions (Signed)
We will get you back in with Dr. Tonita Cong to look at your shoulder.  Use your pain medication as needed in the meantime We are going to try victoza for your diabetes. This is a once daily injectable medication It works by increasing your natural insulin secretion and helps your stomach to empty slower (decreasing appetite) Continue your metformin and use the victoza as directed.  Please come and see me in 2 months to recheck on your progress Don't forget that the pain medication can make you sleepy- do not drive while you are taking it

## 2014-02-27 NOTE — Progress Notes (Signed)
[facility] [address] [place] [address] [phone]  Date:  02/27/2014   Name:  Sherry [name]   DOB:  [birth year]   MRN:  [id number]  PCP:  [name], MD    Chief Complaint: Shoulder Injury   History of Present Illness:  Sherry [name] is a 64 y.o. 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 Fell back while she had her bag on her left arm and the arm was pulled back as she fell.  Appareantly WC has declined to cover her treatment at this point.  She has been going to [facility] and it was recommended that she have an MRI.  However this was not done because she reports that her WC carrier would not approve it.  She decided to come and see me to try and pursue treatment for her problem under her own insurance while this discission is appealed.    She has had problems with her left shoulder in the past. She had surgery on her left rotator cuff 2006 and 2013.  First surgery by Dr. Theda Sers, the most recent done by Dr. Tonita Cong.   She is using hydrocodone for pain- she tried ultram but it it not mix well with her zoloft so she had to stop it due to SE.  She is doing ok with the vicodin as long as she sticks to 5mg  and does not noted SE.  She does not like to use prednisone because it will run her glucose very high. I saw her about 2 weeks ago for another issue and we discussed her DM control.  She has not been checking her am glucose all that regularly.  Admits that she is not using her lantus regularly- she may take 40 units but will only take it maybe once a week.  She has not used it for nearly a week.  As her A1c is under 10 without really using insulin it would be reasonable to try something else that might work better for her.  She would like to try victoza due in part to it's potential for helping with weight loss-  weight loss would be great for her health.  Discussed need to monitor her renal function.    Lab Results  Component Value Date   HGBA1C 8.5* 02/09/2014     Patient Active Problem List   Diagnosis Date Noted  . Essential hypertension, benign 06/13/2013  . Type II or unspecified type diabetes mellitus without mention of complication, not stated as uncontrolled 06/13/2013  . Goiter 06/13/2013  . Obesity, unspecified 06/13/2013    Past Medical History  Diagnosis Date  . Diabetes mellitus   . Hypertension   . GERD (gastroesophageal reflux disease)   . Dyslipidemia   . Mitral valve prolapse occasional palpitations w/ chest discomfort  . DJD (degenerative joint disease)   . Meniere's disease   . Rotator cuff tear, left   . History of CHF (congestive heart failure) 2010-  RESOLVED  . H/O hiatal hernia   . Complication of anesthesia HYPOTENSION INTRAOP W/ HYSTERECTOMY IN 1987--  DID OK W/ TOTAL KNEE IN 2008  . OA (osteoarthritis)   . History of peptic ulcer YRS AGO    Past Surgical History  Procedure Laterality Date  . Total knee arthroplasty  09-12-2006  [facility]  RIGHT KNEE  . Vaginal hysterectomy  1987  . Bunionectomy  2007    LEFT FOOT  . Tympanoplasty  1990;   1980  . Left shoulder surgery  1997    ROTATOR CUFF REPAIR  . Tubal ligation  1976  . Shoulder arthroscopy  09/20/2011    Procedure: ARTHROSCOPY SHOULDER;  Surgeon: Johnn Hai, MD;  Location: [facility];  Service: Orthopedics;  Laterality: Left;  SAD AND MINI OPEN ROTATOR CUFF REPAIR      History  Substance Use Topics  . Smoking status: Never Smoker   . Smokeless tobacco: Never Used  . Alcohol Use: No    Family History  Problem Relation Age of Onset  . Heart disease Mother   . Diabetes Mother   . Kidney disease Mother     Allergies  Allergen Reactions  . Influenza Vaccines Anaphylaxis  . Codeine Itching  . Demerol Other (See Comments)    SEIZURE  . Nsaids Other (See Comments)     NAPROXEN, IBUPROFEN, SKELAXIN---  CAUSES PALPITATIONS  . Prednisone   . Tramadol     Medication list has been reviewed and updated.  Current Outpatient Prescriptions on File Prior to Visit  Medication Sig Dispense Refill  . albuterol (PROVENTIL HFA;VENTOLIN HFA) 108 (90 BASE) MCG/ACT inhaler Inhale 2 puffs into the lungs every 6 (six) hours as needed. For shortness of breath.    Marland Kitchen albuterol (PROVENTIL) (2.5 MG/3ML) 0.083% nebulizer solution Take 3 mLs (2.5 mg total) by nebulization every 6 (six) hours as needed. For shortness of breath. 75 mL 1  . aspirin 325 MG tablet Take 325 mg by mouth daily.    Marland Kitchen docusate sodium (COLACE) 100 MG capsule Take 100 mg by mouth daily.    . furosemide (LASIX) 40 MG tablet TAKE 1 TABLET BY MOUTH DAILY 90 tablet 1  . gabapentin (NEURONTIN) 300 MG capsule Take 1 capsule (300 mg total) by mouth 2 (two) times daily. 60 capsule 5  . insulin glargine (LANTUS) 100 UNIT/ML injection Inject 40 Units into the skin daily.     Marland Kitchen lisinopril (PRINIVIL,ZESTRIL) 20 MG tablet TAKE 1 TABLET BY MOUTH DAILY 90 tablet 1  . metFORMIN (GLUCOPHAGE) 500 MG tablet Take 2 tablets (1,000 mg total) by mouth 2 (two) times daily with a meal. PATIENT NEEDS OFFICE VISIT FOR ADDITIONAL REFILLS 120 tablet 0  . metoprolol tartrate (LOPRESSOR) 25 MG tablet TAKE 1 TABLET BY MOUTH TWICE DAILY( NEEDS OFFICE VISIT) 60 tablet 0  . sertraline (ZOLOFT) 25 MG tablet TAKE 1 TABLET BY MOUTH AT BEDTIME 90 tablet 1  . promethazine-dextromethorphan (PROMETHAZINE-DM) 6.25-15 MG/5ML syrup Take 5 mLs by mouth 4 (four) times daily as needed for cough. (Patient not taking: Reported on 02/27/2014) 120 mL 0   No current facility-administered medications on file prior to visit.    Review of Systems:  As per HPI- otherwise negative.   Physical Examination: Filed Vitals:   02/27/14 1422  BP: 128/72  Pulse: 76  Temp: 98.1 F (36.7 C)  Resp: 18   Filed Vitals:   02/27/14 1422  Height: 5\' 3"  (1.6 m)   Weight: 212 lb (96.163 kg)   Body mass index is 37.56 kg/(m^2). Ideal Body Weight: Weight in (lb) to have BMI = 25: 140.8  GEN: WDWN, NAD, Non-toxic, A & O x 3 HEENT: Atraumatic, Normocephalic. Neck supple. No masses, No LAD. Ears and Nose: No external deformity. CV: RRR, No M/G/R. No JVD. No thrill. No extra heart sounds. PULM:  CTA B, no wheezes, crackles, rhonchi. No retractions. No resp. distress. No accessory muscle use. ABD: S, NT, ND, +BS. No rebound. No HSM. EXTR: No c/c/e NEURO Normal gait.  PSYCH: Normally interactive. Conversant. Not depressed or anxious appearing.  Calm demeanor.  Left shoulder;  She has tenderness over the anterior shoulder, and pain with extremes of flexion/ abduction/internal and external rotation.  Mild weakness with empty can and she does have signs of impingement.  OW strength of the UE is normal   Assessment and Plan: Diabetes mellitus type 2 in obese - Plan: Liraglutide (VICTOZA) 18 MG/3ML SOPN  Left shoulder pain - Plan: Ambulatory referral to Orthopedic Surgery, HYDROcodone-acetaminophen (NORCO/VICODIN) 5-325 MG per tablet  Will refer her back to Dr. Tonita Cong, refilled her vicodin to use as needed.   She would like to try victoza for her DM control.  Continue metfomrin.  Went over use, starter dose and continuation dose.  She has used the insulin pen and feels comfortable with using the victoza pen.  She will come and see me in 2 months to check her progress  Signed [name], MD

## 2014-03-03 MED ORDER — LIRAGLUTIDE 18 MG/3ML ~~LOC~~ SOPN: PEN_INJECTOR | SUBCUTANEOUS | Status: DC

## 2014-03-03 MED ORDER — METOPROLOL TARTRATE 25 MG PO TABS: ORAL_TABLET | ORAL | Status: DC

## 2014-03-04 ENCOUNTER — Encounter: Payer: Self-pay | Admitting: Family Medicine

## 2014-03-04 MED ORDER — INSULIN PEN NEEDLE 30G X 5 MM MISC: 1.0000 | Freq: Every day | Status: DC

## 2014-03-06 ENCOUNTER — Encounter: Payer: Self-pay | Admitting: Family Medicine

## 2014-03-11 ENCOUNTER — Encounter: Payer: 59 | Admitting: Family Medicine

## 2014-03-12 ENCOUNTER — Encounter: Payer: Self-pay | Admitting: Family Medicine

## 2014-03-21 ENCOUNTER — Other Ambulatory Visit: Payer: Self-pay | Admitting: Family Medicine

## 2014-04-02 ENCOUNTER — Encounter: Payer: Self-pay | Admitting: Family Medicine

## 2014-05-17 DIAGNOSIS — Z0271 Encounter for disability determination: Secondary | ICD-10-CM

## 2014-05-27 ENCOUNTER — Telehealth: Payer: Self-pay | Admitting: Family Medicine

## 2014-05-27 NOTE — Telephone Encounter (Signed)
Patient left message on disabilities VM, returned phone call to Ander Purpura about her disability paperwork.

## 2014-06-05 ENCOUNTER — Other Ambulatory Visit: Payer: Self-pay | Admitting: Family Medicine

## 2014-06-05 DIAGNOSIS — Z8679 Personal history of other diseases of the circulatory system: Secondary | ICD-10-CM

## 2014-06-05 DIAGNOSIS — H8109 Meniere's disease, unspecified ear: Secondary | ICD-10-CM

## 2014-06-06 NOTE — Telephone Encounter (Signed)
Dr Lorelei Pont, you saw pt for check up in Jan, but don't see this med discussed. Do you want to OK 90 day RF, or just one mos w/note to RTC?

## 2014-06-07 DIAGNOSIS — Z8679 Personal history of other diseases of the circulatory system: Secondary | ICD-10-CM | POA: Insufficient documentation

## 2014-06-07 DIAGNOSIS — H8109 Meniere's disease, unspecified ear: Secondary | ICD-10-CM | POA: Insufficient documentation

## 2014-06-16 ENCOUNTER — Emergency Department (HOSPITAL_COMMUNITY): Admission: EM | Admit: 2014-06-16 | Discharge: 2014-06-16 | Discharge status: Absent without leave | Payer: 59

## 2014-06-16 ENCOUNTER — Other Ambulatory Visit: Payer: Self-pay | Admitting: Physician Assistant

## 2014-06-24 ENCOUNTER — Ambulatory Visit (INDEPENDENT_AMBULATORY_CARE_PROVIDER_SITE_OTHER): Payer: 59

## 2014-06-24 ENCOUNTER — Ambulatory Visit (INDEPENDENT_AMBULATORY_CARE_PROVIDER_SITE_OTHER): Payer: 59 | Admitting: Emergency Medicine

## 2014-06-24 VITALS — BP 138/88 | HR 92 | Temp 98.9°F | Resp 20

## 2014-06-24 DIAGNOSIS — E049 Nontoxic goiter, unspecified: Secondary | ICD-10-CM

## 2014-06-24 DIAGNOSIS — J4541 Moderate persistent asthma with (acute) exacerbation: Secondary | ICD-10-CM | POA: Diagnosis not present

## 2014-06-24 DIAGNOSIS — R05 Cough: Secondary | ICD-10-CM

## 2014-06-24 DIAGNOSIS — R059 Cough, unspecified: Secondary | ICD-10-CM

## 2014-06-24 DIAGNOSIS — E118 Type 2 diabetes mellitus with unspecified complications: Secondary | ICD-10-CM

## 2014-06-24 LAB — POCT CBC
Granulocyte percent: 52.4 %G (ref 37–80)
HCT, POC: 42.7 % (ref 37.7–47.9)
Hemoglobin: 13.4 g/dL (ref 12.2–16.2)
Lymph, poc: 2.8 (ref 0.6–3.4)
MCH, POC: 28.1 pg (ref 27–31.2)
MCHC: 31.6 g/dL — AB (ref 31.8–35.4)
MCV: 88.8 fL (ref 80–97)
MID (cbc): 0.6 (ref 0–0.9)
MPV: 7.6 fL (ref 0–99.8)
POC Granulocyte: 3.8 (ref 2–6.9)
POC LYMPH PERCENT: 38.8 %L (ref 10–50)
POC MID %: 8.8 %M (ref 0–12)
Platelet Count, POC: 314 10*3/uL (ref 142–424)
RBC: 4.77 M/uL (ref 4.04–5.48)
RDW, POC: 15.3 %
WBC: 7.2 10*3/uL (ref 4.6–10.2)

## 2014-06-24 LAB — GLUCOSE, POCT (MANUAL RESULT ENTRY): POC Glucose: 217 mg/dl — AB (ref 70–99)

## 2014-06-24 MED ORDER — ALBUTEROL SULFATE (2.5 MG/3ML) 0.083% IN NEBU: 2.5000 mg | INHALATION_SOLUTION | Freq: Four times a day (QID) | RESPIRATORY_TRACT | Status: DC | PRN

## 2014-06-24 MED ORDER — ALBUTEROL SULFATE (2.5 MG/3ML) 0.083% IN NEBU
2.5000 mg | INHALATION_SOLUTION | Freq: Once | RESPIRATORY_TRACT | Status: AC
  Administered 2014-06-24: 2.5 mg via RESPIRATORY_TRACT

## 2014-06-24 MED ORDER — MOMETASONE FUROATE 220 MCG/INH IN AEPB: INHALATION_SPRAY | RESPIRATORY_TRACT | Status: DC

## 2014-06-24 NOTE — Patient Instructions (Signed)
Goiter Goiter is an enlarged thyroid gland. The thyroid gland sits at the base of the front of the neck. The gland produces hormones that regulate mood, body temperature, pulse rate, and digestion. Most goiters are painless and are not a cause for serious concern. Goiters and conditions that cause goiters can be treated if necessary.  CAUSES  Common causes of goiter include:  Graves disease (causes too much hormone to be produced [hyperthyroidism]).  Hashimoto disease (causes too little hormone to be produced [hypothyroidism]).  Thyroiditis (inflammation of the thyroid sometimes caused by virus or pregnancy).  Nodular goiter (small bumps form; sometimes called toxic nodular goiter).  Pregnancy.  Thyroid cancer (very few goiters with nodules are cancerous).  Certain medications.  Radiation exposure.  Iodine deficiency (more common in developing countries in inland populations). RISK FACTORS Risk factors for goiter include:  A family history of goiter.  Female gender.  Inadequate iodine in the diet.  Age older than 25 years. SYMPTOMS  Many goiters do not cause symptoms. When symptoms do occur, they may include:  Swelling in the lower part of the neck. This swelling can range from a very small bump to a large lump.  A tight feeling in the throat.  A hoarse voice. Less commonly, a goiter may result in:  Coughing.  Wheezing.  Difficulty swallowing.  Difficulty breathing.  Bulging neck veins.  Dizziness. When a goiter is the result of hyperthyroidism, symptoms may include:  Rapid or irregular heartbeat.  Sickness in your stomach (nausea).  Vomiting.  Diarrhea.  Shaking.  Irritable feeling.  Bulging eyes.  Weight loss.  Heat sensitivity.  Anxiety. When a goiter is the result of hypothyroidism, symptoms may include:  Tiredness.  Dry skin.  Constipation.  Weight gain.  Irregular menstrual cycle.  Depressed mood.  Sensitivity to  cold. DIAGNOSIS  Tests used to diagnose goiter include:  A physical exam.  Blood tests, including thyroid hormone levels and antibody testing.  Ultrasonography, computerized X-ray scan (computed tomography, CT) or computerized magnetic scan (magnetic resonance imaging, MRI).  Thyroid scan (imaging along with safe radioactive injection).  Tissue sample taken (biopsy) of nodules. This is sometimes done to confirm that the nodules are not cancerous. TREATMENT  Treatment will depend on the cause of the goiter. Treatment may include:  Monitoring. In some cases, no treatment is necessary, and your doctor will monitor your condition at regular checkups.  Medications and supplements. Thyroid medication (thyroid hormone replacement) is available for hyperthyroidism and hypothyroidism.  If inflammation is the cause, over-the-counter medication or steroid medication may be recommended.  Goiters caused by iodine deficiency can be treated with iodine supplements or changes in diet.  Radioactive iodine treatment. Radioactive iodine is injected into the blood. It travels to the thyroid gland, kills thyroid cells, and reduces the size of the gland. This is only used when the thyroid gland is overactive. Lifelong thyroid hormone medication is often necessary after this treatment.  Surgery. A procedure to remove all or part of the gland may be recommended in severe cases or when cancer is the cause. Hormones can be taken to replace the hormones normally produced by the thyroid. HOME CARE INSTRUCTIONS   Take medications as directed.  Follow your caregiver's recommendations for any dietary changes.  Follow up with your caregiver for further examination and testing, as directed. PREVENTION   If you have a family history of goiter, discuss screening with your doctor.  Make sure you are getting enough iodine in your diet.  Use  of iodized table salt can help prevent iodine deficiency. Document  Released: 07/15/2009 Document Revised: 06/11/2013 Document Reviewed: 07/15/2009 [facility] Patient Information 2015 [place], [place]. This information is not intended to replace advice given to you by your health care provider. Make sure you discuss any questions you have with your health care provider. Asthma Asthma is a recurring condition in which the airways tighten and narrow. Asthma can make it difficult to breathe. It can cause coughing, wheezing, and shortness of breath. Asthma episodes, also called asthma attacks, range from minor to life-threatening. Asthma cannot be cured, but medicines and lifestyle changes can help control it. CAUSES Asthma is believed to be caused by inherited (genetic) and environmental factors, but its exact cause is unknown. Asthma may be triggered by allergens, lung infections, or irritants in the air. Asthma triggers are different for each person. Common triggers include:   Animal dander.  Dust mites.  Cockroaches.  Pollen from trees or grass.  Mold.  Smoke.  Air pollutants such as dust, household cleaners, hair sprays, aerosol sprays, paint fumes, strong chemicals, or strong odors.  Cold air, weather changes, and winds (which increase molds and pollens in the air).  Strong emotional expressions such as crying or laughing hard.  Stress.  Certain medicines (such as aspirin) or types of drugs (such as beta-blockers).  Sulfites in foods and drinks. Foods and drinks that may contain sulfites include dried fruit, potato chips, and sparkling grape juice.  Infections or inflammatory conditions such as the flu, a cold, or an inflammation of the nasal membranes (rhinitis).  Gastroesophageal reflux disease (GERD).  Exercise or strenuous activity. SYMPTOMS Symptoms may occur immediately after asthma is triggered or many hours later. Symptoms include:  Wheezing.  Excessive nighttime or early morning coughing.  Frequent or severe coughing with a common  cold.  Chest tightness.  Shortness of breath. DIAGNOSIS  The diagnosis of asthma is made by a review of your medical history and a physical exam. Tests may also be performed. These may include:  Lung function studies. These tests show how much air you breathe in and out.  Allergy tests.  Imaging tests such as X-rays. TREATMENT  Asthma cannot be cured, but it can usually be controlled. Treatment involves identifying and avoiding your asthma triggers. It also involves medicines. There are 2 classes of medicine used for asthma treatment:   Controller medicines. These prevent asthma symptoms from occurring. They are usually taken every day.  Reliever or rescue medicines. These quickly relieve asthma symptoms. They are used as needed and provide short-term relief. Your health care provider will help you create an asthma action plan. An asthma action plan is a written plan for managing and treating your asthma attacks. It includes a list of your asthma triggers and how they may be avoided. It also includes information on when medicines should be taken and when their dosage should be changed. An action plan may also involve the use of a device called a peak flow meter. A peak flow meter measures how well the lungs are working. It helps you monitor your condition. HOME CARE INSTRUCTIONS   Take medicines only as directed by your health care provider. Speak with your health care provider if you have questions about how or when to take the medicines.  Use a peak flow meter as directed by your health care provider. Record and keep track of readings.  Understand and use the action plan to help minimize or stop an asthma attack without needing to  seek medical care.  Control your home environment in the following ways to help prevent asthma attacks:  Do not smoke. Avoid being exposed to secondhand smoke.  Change your heating and air conditioning filter regularly.  Limit your use of fireplaces and  wood stoves.  Get rid of pests (such as roaches and mice) and their droppings.  Throw away plants if you see mold on them.  Clean your floors and dust regularly. Use unscented cleaning products.  Try to have someone else vacuum for you regularly. Stay out of rooms while they are being vacuumed and for a short while afterward. If you vacuum, use a dust mask from a hardware store, a double-layered or microfilter vacuum cleaner bag, or a vacuum cleaner with a HEPA filter.  Replace carpet with wood, tile, or vinyl flooring. Carpet can trap dander and dust.  Use allergy-proof pillows, mattress covers, and box spring covers.  Wash bed sheets and blankets every week in hot water and dry them in a dryer.  Use blankets that are made of polyester or cotton.  Clean bathrooms and kitchens with bleach. If possible, have someone repaint the walls in these rooms with mold-resistant paint. Keep out of the rooms that are being cleaned and painted.  Wash hands frequently. SEEK MEDICAL CARE IF:   You have wheezing, shortness of breath, or a cough even if taking medicine to prevent attacks.  The colored mucus you cough up (sputum) is thicker than usual.  Your sputum changes from clear or white to yellow, green, gray, or bloody.  You have any problems that may be related to the medicines you are taking (such as a rash, itching, swelling, or trouble breathing).  You are using a reliever medicine more than 2-3 times per week.  Your peak flow is still at 50-79% of your personal best after following your action plan for 1 hour.  You have a fever. SEEK IMMEDIATE MEDICAL CARE IF:   You seem to be getting worse and are unresponsive to treatment during an asthma attack.  You are short of breath even at rest.  You get short of breath when doing very little physical activity.  You have difficulty eating, drinking, or talking due to asthma symptoms.  You develop chest pain.  You develop a fast  heartbeat.  You have a bluish color to your lips or fingernails.  You are light-headed, dizzy, or faint.  Your peak flow is less than 50% of your personal best. MAKE SURE YOU:   Understand these instructions.  Will watch your condition.  Will get help right away if you are not doing well or get worse. Document Released: 01/25/2005 Document Revised: 06/11/2013 Document Reviewed: 08/24/2012 [facility] Patient Information 2015 [place], [place]. This information is not intended to replace advice given to you by your health care provider. Make sure you discuss any questions you have with your health care provider.

## 2014-06-24 NOTE — Progress Notes (Addendum)
Subjective:  This chart was scribed for Sherry Jordan, MD by [facility], medical scribe at [facility].The patient was seen in exam room 03 and the patient's care was started at 9:18 AM.   Patient ID: Sherry [name], female    DOB: [birth year], 64 y.o.   MRN: [id number] Chief Complaint  Patient presents with  . Asthma    x 3-4 days worse last night and this am  . Shortness of Breath  . Wheezing  . Cough   HPI  HPI Comments: Sherry [name] is a 64 y.o. female who presents to [facility] complaining of recurrent asthma flare up with associated shortness of breath, wheezing and a cough. Symptoms began last 3-4 days ago which worsened last night. She tried her nebulizer treatment and inhaler for no relief. Pt believes new air fresheners at home maybe the trigger, she has removed them. Similar symptoms two weeks ago her nebulizer improved her symptoms gradually over a week. She has a long history of asthma. Dr. Lorelei [name] is her PCP. She denies fever.  Review of Systems  Constitutional: Negative for fever.  Respiratory: Positive for cough, shortness of breath and wheezing.   Allergic/Immunologic: Positive for environmental allergies.      Objective:  BP 138/88 mmHg  Pulse 92  Temp(Src) 98.9 F (37.2 C) (Oral)  Resp 20  SpO2 98% Physical Exam  Nursing note and vitals reviewed. CONSTITUTIONAL: Well developed/well nourished, alert and cooperative. HEAD: Normocephalic/atraumatic EYES: EOMI/PERRL ENMT: Mucous membranes moist NECK: supple no meningeal signs, thyroid is diffusely enlarged and nodule. SPINE/BACK:entire spine nontender CV: S1/S2 noted, no murmurs/rubs/gallops noted LUNGS: Lungs are clear to auscultation bilaterally, no apparent distress. She has audible wheezes, breath sounds in both lungs with wheeze, poor air exchanges no crackles heard. ABDOMEN: soft, nontender, no rebound or guarding, bowel sounds noted throughout  abdomen GU:no cva tenderness NEURO: Pt is awake/alert/appropriate, moves all extremitiesx4.  No facial droop.   EXTREMITIES: pulses normal/equal, full ROM SKIN: warm, color normal PSYCH: no abnormalities of mood noted, alert and oriented to situation  Results for orders placed or performed in visit on 06/24/14  POCT CBC  Result Value Ref Range   WBC 7.2 4.6 - 10.2 K/uL   Lymph, poc 2.8 0.6 - 3.4   POC LYMPH PERCENT 38.8 10 - 50 %L   MID (cbc) 0.6 0 - 0.9   POC MID % 8.8 0 - 12 %M   POC Granulocyte 3.8 2 - 6.9   Granulocyte percent 52.4 37 - 80 %G   RBC 4.77 4.04 - 5.48 M/uL   Hemoglobin 13.4 12.2 - 16.2 g/dL   HCT, POC 42.7 37.7 - 47.9 %   MCV 88.8 80 - 97 fL   MCH, POC 28.1 27 - 31.2 pg   MCHC 31.6 (A) 31.8 - 35.4 g/dL   RDW, POC 15.3 %   Platelet Count, POC 314 142 - 424 K/uL   MPV 7.6 0 - 99.8 fL  POCT glucose (manual entry)  Result Value Ref Range   POC Glucose 217 (A) 70 - 99 mg/dl    [facility] reading (PRIMARY) by  Dr. Everlene Farrier she has a scoliotic curve no acute infiltrates heart size normal      Assessment & Plan:   She will be on nebulizer treatments. Referral made to general surgery regarding her goiter. She was also placed on Asmanex 1 puff a day. I did not place her on antibiotics. White count  was good. Sugar slightly elevated to 217. I personally performed the services described in this documentation, which was scribed in my presence. The recorded information has been reviewed and is accurate.  Sherry Jordan, MD

## 2014-06-25 ENCOUNTER — Emergency Department (HOSPITAL_COMMUNITY): Payer: 59

## 2014-06-25 ENCOUNTER — Encounter (HOSPITAL_COMMUNITY): Payer: Self-pay

## 2014-06-25 ENCOUNTER — Emergency Department (HOSPITAL_COMMUNITY)
Admission: EM | Admit: 2014-06-25 | Discharge: 2014-06-25 | Disposition: A | Discharge status: Home / Self Care | Payer: 59 | Attending: Emergency Medicine | Admitting: Emergency Medicine

## 2014-06-25 ENCOUNTER — Telehealth: Payer: Self-pay

## 2014-06-25 DIAGNOSIS — E049 Nontoxic goiter, unspecified: Secondary | ICD-10-CM | POA: Diagnosis not present

## 2014-06-25 DIAGNOSIS — I10 Essential (primary) hypertension: Secondary | ICD-10-CM | POA: Diagnosis not present

## 2014-06-25 DIAGNOSIS — R062 Wheezing: Secondary | ICD-10-CM

## 2014-06-25 DIAGNOSIS — I509 Heart failure, unspecified: Secondary | ICD-10-CM | POA: Insufficient documentation

## 2014-06-25 DIAGNOSIS — Z7951 Long term (current) use of inhaled steroids: Secondary | ICD-10-CM | POA: Diagnosis not present

## 2014-06-25 DIAGNOSIS — J45901 Unspecified asthma with (acute) exacerbation: Secondary | ICD-10-CM | POA: Diagnosis not present

## 2014-06-25 DIAGNOSIS — M1990 Unspecified osteoarthritis, unspecified site: Secondary | ICD-10-CM | POA: Diagnosis not present

## 2014-06-25 DIAGNOSIS — Z7982 Long term (current) use of aspirin: Secondary | ICD-10-CM | POA: Diagnosis not present

## 2014-06-25 DIAGNOSIS — R059 Cough, unspecified: Secondary | ICD-10-CM

## 2014-06-25 DIAGNOSIS — R05 Cough: Secondary | ICD-10-CM | POA: Insufficient documentation

## 2014-06-25 DIAGNOSIS — E119 Type 2 diabetes mellitus without complications: Secondary | ICD-10-CM | POA: Diagnosis not present

## 2014-06-25 DIAGNOSIS — Z79899 Other long term (current) drug therapy: Secondary | ICD-10-CM | POA: Insufficient documentation

## 2014-06-25 DIAGNOSIS — Z794 Long term (current) use of insulin: Secondary | ICD-10-CM | POA: Insufficient documentation

## 2014-06-25 DIAGNOSIS — Z8711 Personal history of peptic ulcer disease: Secondary | ICD-10-CM | POA: Diagnosis not present

## 2014-06-25 DIAGNOSIS — R0602 Shortness of breath: Secondary | ICD-10-CM

## 2014-06-25 LAB — I-STAT TROPONIN, ED: Troponin i, poc: 0 ng/mL (ref 0.00–0.08)

## 2014-06-25 LAB — BASIC METABOLIC PANEL
Anion gap: 10 (ref 5–15)
BUN: 13 mg/dL (ref 6–20)
CO2: 24 mmol/L (ref 22–32)
Calcium: 8.6 mg/dL — ABNORMAL LOW (ref 8.9–10.3)
Chloride: 108 mmol/L (ref 101–111)
Creatinine, Ser: 0.67 mg/dL (ref 0.44–1.00)
GFR calc Af Amer: >60 mL/min (ref >60)
GFR calc non Af Amer: >60 mL/min (ref >60)
Glucose, Bld: 276 mg/dL — ABNORMAL HIGH (ref 65–99)
Potassium: 3.4 mmol/L — ABNORMAL LOW (ref 3.5–5.1)
Sodium: 142 mmol/L (ref 135–145)

## 2014-06-25 LAB — CBC WITH DIFFERENTIAL/PLATELET
Basophils Absolute: 0 10*3/uL (ref 0.0–0.1)
Basophils Relative: 1 % (ref 0–1)
Eosinophils Absolute: 0.4 10*3/uL (ref 0.0–0.7)
Eosinophils Relative: 6 % — ABNORMAL HIGH (ref 0–5)
HCT: 40.1 % (ref 36.0–46.0)
Hemoglobin: 12.8 g/dL (ref 12.0–15.0)
Lymphocytes Relative: 46 % (ref 12–46)
Lymphs Abs: 3.1 10*3/uL (ref 0.7–4.0)
MCH: 29 pg (ref 26.0–34.0)
MCHC: 31.9 g/dL (ref 30.0–36.0)
MCV: 90.7 fL (ref 78.0–100.0)
Monocytes Absolute: 0.4 10*3/uL (ref 0.1–1.0)
Monocytes Relative: 7 % (ref 3–12)
Neutro Abs: 2.7 10*3/uL (ref 1.7–7.7)
Neutrophils Relative %: 40 % — ABNORMAL LOW (ref 43–77)
Platelets: 269 10*3/uL (ref 150–400)
RBC: 4.42 MIL/uL (ref 3.87–5.11)
RDW: 13.6 % (ref 11.5–15.5)
WBC: 6.6 10*3/uL (ref 4.0–10.5)

## 2014-06-25 LAB — TSH: TSH: 0.283 u[IU]/mL — ABNORMAL LOW (ref 0.350–4.500)

## 2014-06-25 MED ORDER — METHYLPREDNISOLONE SODIUM SUCC 125 MG IJ SOLR
125.0000 mg | Freq: Once | INTRAMUSCULAR | Status: AC
  Administered 2014-06-25: 125 mg via INTRAVENOUS
  Filled 2014-06-25: qty 2

## 2014-06-25 MED ORDER — DEXAMETHASONE 4 MG PO TABS: 4.0000 mg | ORAL_TABLET | Freq: Every day | ORAL | Status: DC

## 2014-06-25 MED ORDER — SODIUM CHLORIDE 0.9 % IV BOLUS (SEPSIS)
1000.0000 mL | Freq: Once | INTRAVENOUS | Status: AC
  Administered 2014-06-25: 1000 mL via INTRAVENOUS

## 2014-06-25 MED ORDER — HYDROCOD POLST-CPM POLST ER 10-8 MG/5ML PO SUER: 5.0000 mL | Freq: Two times a day (BID) | ORAL | Status: DC | PRN

## 2014-06-25 MED ORDER — IOHEXOL 350 MG/ML SOLN
100.0000 mL | Freq: Once | INTRAVENOUS | Status: AC | PRN
  Administered 2014-06-25: 100 mL via INTRAVENOUS

## 2014-06-25 MED ORDER — IPRATROPIUM-ALBUTEROL 0.5-2.5 (3) MG/3ML IN SOLN
RESPIRATORY_TRACT | Status: AC
  Filled 2014-06-25: qty 3

## 2014-06-25 MED ORDER — IPRATROPIUM-ALBUTEROL 0.5-2.5 (3) MG/3ML IN SOLN
3.0000 mL | Freq: Once | RESPIRATORY_TRACT | Status: AC
Start: 2014-06-25 — End: 2014-06-25
  Administered 2014-06-25: 3 mL via RESPIRATORY_TRACT

## 2014-06-25 NOTE — ED Provider Notes (Signed)
CSN: [id number]     Arrival date & time 06/25/14  1729 History   First MD Initiated Contact with Patient 06/25/14 1736     Chief Complaint  Patient presents with  . Cough  . Shortness of Breath     (Consider location/radiation/quality/duration/timing/severity/associated sxs/prior Treatment) Patient is a 64 y.o. female presenting with cough and shortness of breath. The history is provided by the patient and medical records.  Cough Associated symptoms: shortness of breath and wheezing   Shortness of Breath Associated symptoms: cough and wheezing    This is a 64 y.o. F with PMH significant for DM, HTN, GERD, dyslipidemia, DJD, presenting to the ED for SOB.  Patient states usually once a year she has a bad flareup of her asthma, however this is a second time this month of this happened. She reports a dry cough and wheezing. She denies any fever or chills. She was seen yesterday at urgent care by Dr. Everlene Farrier and started on new inhaler, however states she medicine is not getting past her "goiter".  She states she has known thyroid nodules that were evaluated by FNA bx approx 2 years ago at [place] she was told nodules were benign, she has had no real check up of thyroid since this time.  States she was told yesterday that her thyroid was "enlarged" again and she needed to see a Psychologist, sport and exercise.  She denies any difficulty eating/drinking or handling her secretions.  No fever, chills, sweats.  No chest pain, palpitations, dizziness, weakness.  VSS.  Past Medical History  Diagnosis Date  . Diabetes mellitus   . Hypertension   . GERD (gastroesophageal reflux disease)   . Dyslipidemia   . Mitral valve prolapse occasional palpitations w/ chest discomfort  . DJD (degenerative joint disease)   . Meniere's disease   . Rotator cuff tear, left   . History of CHF (congestive heart failure) 2010-  RESOLVED  . H/O hiatal hernia   . Complication of anesthesia HYPOTENSION INTRAOP W/ HYSTERECTOMY IN 1987--   DID OK W/ TOTAL KNEE IN 2008  . OA (osteoarthritis)   . History of peptic ulcer YRS AGO   Past Surgical History  Procedure Laterality Date  . Total knee arthroplasty  09-12-2006  [facility]    RIGHT KNEE  . Vaginal hysterectomy  1987  . Bunionectomy  2007    LEFT FOOT  . Tympanoplasty  1990;   1980  . Left shoulder surgery  1997    ROTATOR CUFF REPAIR  . Tubal ligation  1976  . Shoulder arthroscopy  09/20/2011    Procedure: ARTHROSCOPY SHOULDER;  Surgeon: Johnn Hai, MD;  Location: [facility];  Service: Orthopedics;  Laterality: Left;  SAD AND MINI OPEN ROTATOR CUFF REPAIR     Family History  Problem Relation Age of Onset  . Heart disease Mother   . Diabetes Mother   . Kidney disease Mother    History  Substance Use Topics  . Smoking status: Never Smoker   . Smokeless tobacco: Never Used  . Alcohol Use: No   OB History    No data available     Review of Systems  Respiratory: Positive for cough, shortness of breath and wheezing.   All other systems reviewed and are negative.     Allergies  Influenza vaccines; Codeine; Demerol; Nsaids; Prednisone; and Tramadol  Home Medications   Prior to Admission medications   Medication Sig Start Date End Date Taking? Authorizing Provider  albuterol (PROVENTIL  HFA;VENTOLIN HFA) 108 (90 BASE) MCG/ACT inhaler Inhale 2 puffs into the lungs every 6 (six) hours as needed. For shortness of breath.    Historical Provider, MD  albuterol (PROVENTIL) (2.5 MG/3ML) 0.083% nebulizer solution Take 3 mLs (2.5 mg total) by nebulization every 6 (six) hours as needed. For shortness of breath. 02/09/14   Gay Filler Copland, MD  albuterol (PROVENTIL) (2.5 MG/3ML) 0.083% nebulizer solution Take 3 mLs (2.5 mg total) by nebulization every 6 (six) hours as needed for wheezing or shortness of breath. 06/24/14   Darlyne Russian, MD  aspirin 325 MG tablet Take 325 mg by mouth daily.    Historical Provider, MD  docusate sodium (COLACE) 100 MG  capsule Take 100 mg by mouth daily.    Historical Provider, MD  furosemide (LASIX) 40 MG tablet TAKE 1 TABLET BY MOUTH DAILY 06/06/14   Gay Filler Copland, MD  gabapentin (NEURONTIN) 300 MG capsule Take 1 capsule (300 mg total) by mouth 2 (two) times daily. 02/13/14   Darreld Mclean, MD  HYDROcodone-acetaminophen (NORCO/VICODIN) 5-325 MG per tablet Take 1 tablet by mouth every 6 (six) hours as needed for moderate pain.    Historical Provider, MD  Insulin Pen Needle 30G X 5 MM MISC 1 each by Does not apply route daily. 03/04/14   Chelle Jeffery, PA-C  Liraglutide (VICTOZA) 18 MG/3ML SOPN Give 0.6 mg [place] dally for one week, then 1.2 mg [place] daily 03/03/14   Chelle Jeffery, PA-C  lisinopril (PRINIVIL,ZESTRIL) 20 MG tablet TAKE 1 TABLET BY MOUTH DAILY. 03/22/14   Mancel Bale, PA-C  metFORMIN (GLUCOPHAGE) 500 MG tablet Take 2 tablets (1,000 mg total) by mouth 2 (two) times daily with a meal. PATIENT NEEDS OFFICE VISIT FOR ADDITIONAL REFILLS 12/19/13   Chelle Jeffery, PA-C  metoprolol tartrate (LOPRESSOR) 25 MG tablet TAKE 1 TABLET BY MOUTH TWICE DAILY.. "OV NEEDED FOR ADDITIONAL REFILLS" 06/16/14   Chelle Jeffery, PA-C  mometasone [facility]) 220 MCG/INH inhaler Use 1 puff daily followed by rinsing your mouth out with water. 06/24/14   Darlyne Russian, MD  sertraline (ZOLOFT) 25 MG tablet TAKE 1 TABLET BY MOUTH AT BEDTIME    Jessica C Copland, MD   BP 154/84 mmHg  Pulse 146  Temp(Src) 98.5 F (36.9 C) (Oral)  Resp 26  SpO2 97%   Physical Exam  Constitutional: She is oriented to person, place, and time. She appears well-developed and well-nourished. No distress.  HENT:  Head: Normocephalic and atraumatic.  Right Ear: Tympanic membrane and ear canal normal.  Left Ear: Tympanic membrane and ear canal normal.  Nose: Nose normal.  Mouth/Throat: Uvula is midline, oropharynx is clear and moist and mucous membranes are normal. No oropharyngeal exudate, posterior oropharyngeal edema, posterior oropharyngeal erythema  or tonsillar abscesses.  Eyes: Conjunctivae and EOM are normal. Pupils are equal, round, and reactive to light.  Neck: Normal range of motion. Neck supple. Thyromegaly present.  Thyromegaly with goiter noted, nontender; no overlying skin changes  Cardiovascular: Normal rate, regular rhythm and normal heart sounds.   Pulmonary/Chest: Effort normal. No respiratory distress. She has wheezes. She has no rhonchi. She has no rales.  Scattered wheezes, more pronounced in left lung fields; able to speak in full, complete sentences; no stridor, handling secretions well; dry cough present  Abdominal: Soft. Bowel sounds are normal. There is no tenderness. There is no guarding.  Musculoskeletal: Normal range of motion.  Neurological: She is alert and oriented to person, place, and time.  Skin: Skin is  warm and dry. She is not diaphoretic.  Psychiatric: She has a normal mood and affect.  Nursing note and vitals reviewed.   ED Course  Procedures (including critical care time) Labs Review Labs Reviewed  CBC WITH DIFFERENTIAL/PLATELET - Abnormal; Notable for the following:    Neutrophils Relative % 40 (*)    Eosinophils Relative 6 (*)    All other components within normal limits  BASIC METABOLIC PANEL - Abnormal; Notable for the following:    Potassium 3.4 (*)    Glucose, Bld 276 (*)    Calcium 8.6 (*)    All other components within normal limits  TSH - Abnormal; Notable for the following:    TSH 0.283 (*)    All other components within normal limits  I-STAT TROPOININ, ED    Imaging Review Dg Chest 2 View  06/24/2014   CLINICAL DATA:  Shortness of breath  EXAM: CHEST  2 VIEW  COMPARISON:  Chest x-ray of February 09, 2014  FINDINGS: The lungs are adequately inflated and clear. The heart and pulmonary vascularity are normal. There is no pleural effusion or pneumothorax. The mediastinum is normal in width. There is gentle levocurvature of the upper thoracic spine which is stable. There is multilevel  degenerative disc space narrowing of the thoracic spine.  IMPRESSION: There is no active cardiopulmonary disease.   Electronically Signed   By: David  Martinique M.D.   On: 06/24/2014 10:36   Ct Soft Tissue Neck W Contrast  06/25/2014   CLINICAL DATA:  Stridor.  Cough.  Difficulty swallowing.  EXAM: CT NECK WITH CONTRAST  TECHNIQUE: Multidetector CT imaging of the neck was performed using the standard protocol following the bolus administration of intravenous contrast.  CONTRAST:  147mL OMNIPAQUE IOHEXOL 350 MG/ML SOLN  COMPARISON:  None.  FINDINGS: Pharynx and larynx: There is slight prominence of the adenoids. The palatine tonsils are normal. Epiglottis is normal. Tongue base is normal. Valleculae and piriform sinuses are normal. Larynx appears normal including the vocal cords.  Salivary glands: Normal.  Thyroid: Multinodular goiter. Dominant left sided nodule measures 5.1 x 4.9 cm. There is slight deviation of the trachea to the right just above the thoracic inlet but the trachea is not compressed.  Lymph nodes: Normal.  Vascular: Normal.  Limited intracranial: Normal.  Visualized orbits: Normal.  Mastoids and visualized paranasal sinuses: There is mucosal thickening in the ethmoid and maxillary sinuses with small air-fluid levels in both maxillary sinuses.  Skeleton: Degenerative disc disease throughout the cervical spine. Multiple missing teeth.  Upper chest: Lung apices are clear.  IMPRESSION: Slight prominence of the adenoids.  Multinodular goiter with deviation of the trachea to the right without tracheal compression.  Air-fluid levels in both maxillary sinuses with slight mucosal thickening of the ethmoids. This could represent acute sinusitis.   Electronically Signed   By: Lorriane Shire M.D.   On: 06/25/2014 20:38   Ct Angio Chest Pe W/cm &/or Wo Cm  06/25/2014   CLINICAL DATA:  goiter. Pt also has asthma with cough. Recently given steroids and inhalers. Pt states meds not getting past goiter. 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 Pt states one of the symptoms of the medicine is difficulty swallowing and multiple other same complaints. Pt heart rale elevated  EXAM: CT ANGIOGRAPHY CHEST WITH CONTRAST  TECHNIQUE: Multidetector CT imaging of the chest was performed using the standard protocol during bolus administration of intravenous contrast. Multiplanar CT image reconstructions and MIPs were obtained to evaluate  the vascular anatomy.  CONTRAST:  173mL OMNIPAQUE IOHEXOL 350 MG/ML SOLN  COMPARISON:  None.  FINDINGS: Asymmetric enlargement the thyroid left greater than right with early substernal extension, incompletely visualized. Left arm contrast injection for CTA. Satisfactory opacification of pulmonary arteries noted, and there is no evidence of pulmonary emboli. Patient breathing motion during the acquisition degrades some of the images. Pulmonary veins are patent. Adequate contrast opacification of the thoracic aorta with no evidence of dissection, aneurysm, or stenosis. There is classic 3-vessel brachiocephalic arch anatomy without proximal stenosis. No significant atheromatous change.  No pleural or pericardial effusion. No hilar or mediastinal adenopathy. Mild subsegmental dependent atelectasis posteriorly in both lower lobes right greater than left. Lungs otherwise clear. Spondylitic changes throughout the thoracic spine. Sternum intact. Visualized portions of upper abdomen unremarkable.  Review of the MIP images confirms the above findings.  IMPRESSION: 1. Negative for acute PE or thoracic aortic dissection. 2. Asymmetric thyroid enlargement, incompletely visualized.   Electronically Signed   By: Lucrezia Europe M.D.   On: 06/25/2014 20:30     EKG Interpretation None      MDM   Final diagnoses:  Shortness of breath  Goiter  Cough  Wheezing   64 year old female here with cough, wheezing, shortness of breath. She was seen at urgent care yesterday for the same, but states "the medications and  getting into her lungs because of goiter".  Patient is afebrile and non-toxic.  Her vital signs are stable on room air and she is in no acute respiratory distress. She does have noted thyromegaly with goiter, however her airway is widely patent. She is handling secretions well and has no difficulty swallowing or speaking. She has scattered wheezes, more pronounced in left lung fields. She had chest x-ray performed yesterday which was negative for pneumonia.  Suspect URI with component of asthma.  Will obtain lab work, given steroids and nebulizer treatment.  Lab work overall reassuring, trop negative.  States neb treatment did not help but thinks steroids did help.  She continues to have dry cough.  Patient evaluated by attending physician, Dr. Darl Householder-- concern for possible upper airway obstruction and CT soft tissue obtained revealing goiter which is known since 2013 without tracheal compression although is deviated somewhat.  CTA also obtained which is negative for PE.  Patient was able to ambulate and maintained O2 sats of 94-96%, HR 112.  She denies frank SOB when walking, just states it makes her cough.  She has concern over Vicotza which she started on in March of this year. Victoza can cause thyroid tumor formation and/or thyroid cancer, however her goiter was found several years before she began this medication.  I have reviewed the notes from [facility] regarding her FNA bx, nodules were in fact benign at that time.  TSH today is low at 0.283. At this time, patient is stable and i do not feel that she needs to be hospitalized.  I have encouraged her to discuss concerns over victoza with her primary care physician as she is managing her DM. She was also referred to general surgery yesterday for further evaluation of her thyroid nodules and should FU with them as well.  Rx low dose decadron and tussionex.  She was encouraged to monitor CBG closely over the next few days while taking decadron, esp since has to hold  metformin after CTA that was done.  Discussed plan with patient, he/she acknowledged understanding and agreed with plan of care.  Return precautions given for new  or worsening symptoms.  Case discussed with attending physician, Dr. Darl Householder, who evaluated patient and agrees with assessment and plan of care.  Larene Pickett, PA-C 06/25/14 2257  Wandra Arthurs, MD 06/25/14 [phone]

## 2014-06-25 NOTE — ED Notes (Signed)
Pt ambulated with some coughing HR=112 SPO2=94-96 RN notifited

## 2014-06-25 NOTE — Discharge Instructions (Signed)
Take the prescribed medication as directed.  Watch your glucose closely while taking the decadron. Follow-up with your primary care physician. Return to the ED for new or worsening symptoms.

## 2014-06-25 NOTE — Telephone Encounter (Signed)
Patient was seen yesterday.  Has questions reqarding the prescription and the effects it has on her thyroid.   Also, wants to know the status of her referral to the thyroid MD>    [phone]

## 2014-06-25 NOTE — ED Notes (Signed)
Pt has a goiter.  Pt also has asthma with cough.  Recently given steroids and inhalers.  Pt states meds not getting past goiter.  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  Pt states one of the symptoms of the medicine is difficulty swallowing and multiple other same complaints.  Pt heart rale elevated   Pt having difficulty forming full sentences.

## 2014-06-26 ENCOUNTER — Encounter: Payer: Self-pay | Admitting: Family Medicine

## 2014-06-26 NOTE — Telephone Encounter (Signed)
Left message for pt to call back  °

## 2014-07-05 DIAGNOSIS — Z0271 Encounter for disability determination: Secondary | ICD-10-CM

## 2014-07-10 ENCOUNTER — Ambulatory Visit (INDEPENDENT_AMBULATORY_CARE_PROVIDER_SITE_OTHER): Payer: 59 | Admitting: Family Medicine

## 2014-07-10 VITALS — BP 142/80 | HR 77 | Temp 97.9°F | Resp 17 | Ht 61.25 in | Wt 201.2 lb

## 2014-07-10 DIAGNOSIS — R05 Cough: Secondary | ICD-10-CM

## 2014-07-10 DIAGNOSIS — R946 Abnormal results of thyroid function studies: Secondary | ICD-10-CM | POA: Diagnosis not present

## 2014-07-10 DIAGNOSIS — I10 Essential (primary) hypertension: Secondary | ICD-10-CM | POA: Diagnosis not present

## 2014-07-10 DIAGNOSIS — E118 Type 2 diabetes mellitus with unspecified complications: Secondary | ICD-10-CM | POA: Diagnosis not present

## 2014-07-10 DIAGNOSIS — E049 Nontoxic goiter, unspecified: Secondary | ICD-10-CM

## 2014-07-10 DIAGNOSIS — R059 Cough, unspecified: Secondary | ICD-10-CM

## 2014-07-10 DIAGNOSIS — R7989 Other specified abnormal findings of blood chemistry: Secondary | ICD-10-CM

## 2014-07-10 LAB — POCT GLYCOSYLATED HEMOGLOBIN (HGB A1C): Hemoglobin A1C: 8.7

## 2014-07-10 MED ORDER — GLIPIZIDE 5 MG PO TABS
5.0000 mg | ORAL_TABLET | Freq: Every day | ORAL | Status: DC
Start: 2014-07-10 — End: 2014-09-19

## 2014-07-10 MED ORDER — HYDROCOD POLST-CPM POLST ER 10-8 MG/5ML PO SUER: 5.0000 mL | Freq: Two times a day (BID) | ORAL | Status: DC | PRN

## 2014-07-10 MED ORDER — METOPROLOL TARTRATE 25 MG PO TABS: 25.0000 mg | ORAL_TABLET | Freq: Two times a day (BID) | ORAL | Status: DC

## 2014-07-10 MED ORDER — GABAPENTIN 300 MG PO CAPS: 300.0000 mg | ORAL_CAPSULE | Freq: Two times a day (BID) | ORAL | Status: DC

## 2014-07-10 NOTE — Progress Notes (Signed)
[facility] [address] [place] [address] [phone]  Date:  07/10/2014   Name:  Sherry [name]   DOB:  [birth year]   MRN:  [id number]  PCP:  [name], MD    Chief Complaint: Follow-up and Medication Refill   History of Present Illness:  Sherry [name] is a 64 y.o. very pleasant female patient who presents with the following:  Last seen by myself in January of this year.   She had tried victoza but we stopped this as it seemed to upset her stomach.    She had a recent CT scan of her thyroid when she went to the ER with SOB- she has a large goiter that does press on her trachea but does not compress it.  She feels the goiter and finds it to be uncomfortable; she would be wiling to consider surgery for this issue. She would like a referral to [facility] in [facility], Dr. Bethel Born but it seems there has been some confusion in getting this referral done; she called and they told her that she was not in their system  She is only on metformin for her DM at this time.    She was given tussionex for her chronic cough when she was in the ER last month- this helped her a lot but she is almost out She does not have an endocrinologist.  She notes that her TSH was low when she was in the ER a couple of weeks ago.  She did see endocrine several years ago but does not remember who she saw.    Lab Results  Component Value Date   HGBA1C 8.7 07/10/2014     Patient Active Problem List   Diagnosis Date Noted  . Meniere's disease 06/07/2014  . History of CHF (congestive heart failure) 06/07/2014  . Essential hypertension, benign 06/13/2013  . Type II or unspecified type diabetes mellitus without mention of complication, not stated as uncontrolled 06/13/2013  . Goiter 06/13/2013  . Obesity, unspecified 06/13/2013    Past Medical History  Diagnosis Date  . Diabetes mellitus   . Hypertension   . GERD (gastroesophageal reflux disease)   .  Dyslipidemia   . Mitral valve prolapse occasional palpitations w/ chest discomfort  . DJD (degenerative joint disease)   . Meniere's disease   . Rotator cuff tear, left   . History of CHF (congestive heart failure) 2010-  RESOLVED  . H/O hiatal hernia   . Complication of anesthesia HYPOTENSION INTRAOP W/ HYSTERECTOMY IN 1987--  DID OK W/ TOTAL KNEE IN 2008  . OA (osteoarthritis)   . History of peptic ulcer YRS AGO    Past Surgical History  Procedure Laterality Date  . Total knee arthroplasty  09-12-2006  [facility]    RIGHT KNEE  . Vaginal hysterectomy  1987  . Bunionectomy  2007    LEFT FOOT  . Tympanoplasty  1990;   1980  . Left shoulder surgery  1997    ROTATOR CUFF REPAIR  . Tubal ligation  1976  . Shoulder arthroscopy  09/20/2011    Procedure: ARTHROSCOPY SHOULDER;  Surgeon: Johnn Hai, MD;  Location: [facility];  Service: Orthopedics;  Laterality: Left;  SAD AND MINI OPEN ROTATOR CUFF REPAIR      History  Substance Use Topics  . Smoking status: Never Smoker   . Smokeless tobacco: Never Used  . Alcohol Use: No    Family History  Problem Relation Age of  Onset  . Heart disease Mother   . Diabetes Mother   . Kidney disease Mother     Allergies  Allergen Reactions  . Influenza Vaccines Anaphylaxis  . Codeine Itching  . Demerol Other (See Comments)    SEIZURE  . Nsaids Other (See Comments)    NAPROXEN, IBUPROFEN, SKELAXIN---  CAUSES PALPITATIONS  . Prednisone     Caused her glucose to go up to 500 and went to ED  . Tramadol     Contraindicated with Victoza     Medication list has been reviewed and updated.  Current Outpatient Prescriptions on File Prior to Visit  Medication Sig Dispense Refill  . albuterol (PROVENTIL HFA;VENTOLIN HFA) 108 (90 BASE) MCG/ACT inhaler Inhale 2 puffs into the lungs every 6 (six) hours as needed. For shortness of breath.    Marland Kitchen albuterol (PROVENTIL) (2.5 MG/3ML) 0.083% nebulizer solution Take 3 mLs (2.5 mg total) by  nebulization every 6 (six) hours as needed. For shortness of breath. (Patient taking differently: Take 2.5 mg by nebulization every 6 (six) hours as needed for wheezing or shortness of breath. ) 75 mL 1  . albuterol (PROVENTIL) (2.5 MG/3ML) 0.083% nebulizer solution Take 3 mLs (2.5 mg total) by nebulization every 6 (six) hours as needed for wheezing or shortness of breath. 150 mL 1  . aspirin 325 MG tablet Take 325 mg by mouth daily.    Marland Kitchen dexamethasone (DECADRON) 4 MG tablet Take 1 tablet (4 mg total) by mouth daily. 3 tablet 0  . docusate sodium (COLACE) 100 MG capsule Take 100 mg by mouth daily.    . furosemide (LASIX) 40 MG tablet TAKE 1 TABLET BY MOUTH DAILY 90 tablet 0  . HYDROcodone-acetaminophen (NORCO/VICODIN) 5-325 MG per tablet Take 1 tablet by mouth every 6 (six) hours as needed for moderate pain.    . Insulin Pen Needle 30G X 5 MM MISC 1 each by Does not apply route daily. 200 each 3  . lisinopril (PRINIVIL,ZESTRIL) 20 MG tablet TAKE 1 TABLET BY MOUTH DAILY. (Patient taking differently: Take 20 mg by mouth daily. ) 90 tablet 0  . metFORMIN (GLUCOPHAGE) 500 MG tablet Take 2 tablets (1,000 mg total) by mouth 2 (two) times daily with a meal. PATIENT NEEDS OFFICE VISIT FOR ADDITIONAL REFILLS 120 tablet 0  . mometasone (ASMANEX) 220 MCG/INH inhaler Use 1 puff daily followed by rinsing your mouth out with water. (Patient taking differently: Inhale 1 puff into the lungs daily. followed by rinsing your mouth out with water.) 1 Inhaler 12  . sertraline (ZOLOFT) 25 MG tablet TAKE 1 TABLET BY MOUTH AT BEDTIME 90 tablet 1   No current facility-administered medications on file prior to visit.    Review of Systems:  As per HPI- otherwise negative.   Physical Examination: Filed Vitals:   07/10/14 1407  BP: 142/80  Pulse: 77  Temp: 97.9 F (36.6 C)  Resp: 17   Filed Vitals:   07/10/14 1407  Height: 5' 1.25" (1.556 m)  Weight: 201 lb 3.2 oz (91.264 kg)   Body mass index is 37.69  kg/(m^2). Ideal Body Weight: Weight in (lb) to have BMI = 25: 133.1  GEN: WDWN, NAD, Non-toxic, A & O x 3, obese, looks well HEENT: Atraumatic, Normocephalic. Neck supple. Goiter evident, No LAD.  Bilateral TM wnl, oropharynx normal.  PEERL,EOMI.   Ears and Nose: No external deformity. CV: RRR, No M/G/R. No JVD. No thrill. No extra heart sounds. PULM: CTA B, no wheezes, crackles, rhonchi. No  retractions. No resp. distress. No accessory muscle use. EXTR: No c/c/e NEURO Normal gait.  PSYCH: Normally interactive. Conversant. Not depressed or anxious appearing.  Calm demeanor.   Results for orders placed or performed in visit on 07/10/14  POCT glycosylated hemoglobin (Hb A1C)  Result Value Ref Range   Hemoglobin A1C 8.7    Assessment and Plan: Diabetes mellitus type 2 with complications - Plan: POCT glycosylated hemoglobin (Hb A1C), glipiZIDE (GLUCOTROL) 5 MG tablet, gabapentin (NEURONTIN) 300 MG capsule  Goiter - Plan: Ambulatory referral to ENT, Ambulatory referral to Endocrinology  Cough - Plan: chlorpheniramine-HYDROcodone (TUSSIONEX PENNKINETIC ER) 10-8 MG/5ML SUER  Essential hypertension - Plan: metoprolol tartrate (LOPRESSOR) 25 MG tablet  Low TSH level - Plan: Ambulatory referral to Endocrinology  Her DM is under reasonable control.  Add 5mg  of glipizide in hopes of getting her A1c to goal Referral to ENT for her goiter- placed referral to her preferred provider Did refill her tussionex but reminded her that this is a narcotic.  She was not aware of this- will use sparingly Refilled lopressor Referral to endocrine- her TSH is just slightly low so will not start any anti-thyroid therapy at this time.   See patient instructions for more details.     Signed [name], MD

## 2014-07-10 NOTE — Patient Instructions (Signed)
I will take care of getting you in with your ENT doctor and also with endocrinology regarding your thyroid.   Add 5mg  of glipizide to your diabetes regimen,   Let's plan to recheck  In 3 months.

## 2014-07-11 ENCOUNTER — Encounter: Payer: Self-pay | Admitting: Family Medicine

## 2014-07-16 ENCOUNTER — Encounter: Payer: Self-pay | Admitting: Family Medicine

## 2014-07-16 DIAGNOSIS — IMO0002 Reserved for concepts with insufficient information to code with codable children: Secondary | ICD-10-CM

## 2014-07-16 DIAGNOSIS — E1165 Type 2 diabetes mellitus with hyperglycemia: Secondary | ICD-10-CM

## 2014-07-30 ENCOUNTER — Other Ambulatory Visit: Payer: Self-pay | Admitting: Physician Assistant

## 2014-08-05 ENCOUNTER — Other Ambulatory Visit: Payer: Self-pay | Admitting: Physician Assistant

## 2014-08-05 ENCOUNTER — Other Ambulatory Visit: Payer: Self-pay

## 2014-08-05 DIAGNOSIS — Z1231 Encounter for screening mammogram for malignant neoplasm of breast: Secondary | ICD-10-CM

## 2014-08-06 ENCOUNTER — Encounter: Payer: Self-pay | Admitting: Endocrinology

## 2014-08-06 ENCOUNTER — Ambulatory Visit (INDEPENDENT_AMBULATORY_CARE_PROVIDER_SITE_OTHER): Payer: 59 | Admitting: Endocrinology

## 2014-08-06 VITALS — BP 128/85 | HR 70 | Ht 61.5 in | Wt 200.0 lb

## 2014-08-06 DIAGNOSIS — E0590 Thyrotoxicosis, unspecified without thyrotoxic crisis or storm: Secondary | ICD-10-CM

## 2014-08-06 DIAGNOSIS — R0600 Dyspnea, unspecified: Secondary | ICD-10-CM | POA: Diagnosis not present

## 2014-08-06 MED ORDER — FUROSEMIDE 20 MG PO TABS: 20.0000 mg | ORAL_TABLET | Freq: Every day | ORAL | Status: DC

## 2014-08-06 MED ORDER — EMPAGLIFLOZIN 10 MG PO TABS: 10.0000 mg | ORAL_TABLET | Freq: Every day | ORAL | Status: DC

## 2014-08-06 NOTE — Patient Instructions (Addendum)
good diet and exercise significantly improve the control of your diabetes.  please let me know if you wish to be referred to a dietician.  high blood sugar is very risky to your health.  you should see an eye doctor and dentist every year.  It is very important to get all recommended vaccinations.  controlling your blood pressure and cholesterol drastically reduces the damage diabetes does to your body.  Those who smoke should quit.  please discuss these with your doctor.  check your blood sugar once a day.  vary the time of day when you check, between before the 3 meals, and at bedtime.  also check if you have symptoms of your blood sugar being too high or too low.  please keep a record of the readings and bring it to your next appointment here.  You can write it on any piece of paper.  please call us sooner if your blood sugar goes below 70, or if you have a lot of readings over 200.   i have sent a prescription to your pharmacy, to add "jardiance." As this makes you urinate more, please reduce the furosemide to 20 mg daily. i have sent a prescription to your pharmacy let's check a thyroid "scan" (a special, but easy and painless type of thyroid x ray).  It works like this: you go to the x-ray department of the hospital to swallow a pill, which contains a miniscule amount of radiation.  You will not notice any symptoms from this.  You will go back to the x-ray department the next day, to lie down in front of a camera.  The results of this will be sent to me.   Based on the results, i hope to order for you a treatment pill of radioactive iodine.  Although it is a larger amount of radiation, you will again notice no symptoms from this.  The pill is gone from your body in a few days (during which you should stay away from other people), but takes several months to work.  Therefore, please return here approximately 6-8 weeks after the treatment.  This treatment has been available for many years, and the only  known side-effect is an underactive thyroid.  It is possible that i would eventually prescribe for you a thyroid hormone pill, which is very inexpensive.  You don't have to worry about side-effects of this thyroid hormone pill, because it is the same molecule your thyroid makes.   Please come back for a follow-up appointment in 6 weeks.

## 2014-08-06 NOTE — Progress Notes (Signed)
Subjective:    Patient ID: Sherry [name], female    DOB: 10/28/1950, 64 y.o.   MRN: [id number]  HPI Hx is from pt and husband.  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 She did not tolerate victoza (sob).  She says cbg's are in the 200's.  Past Medical History  Diagnosis Date  . Diabetes mellitus   . Hypertension   . GERD (gastroesophageal reflux disease)   . Dyslipidemia   . Mitral valve prolapse occasional palpitations w/ chest discomfort  . DJD (degenerative joint disease)   . Meniere's disease   . Rotator cuff tear, left   . History of CHF (congestive heart failure) 2010-  RESOLVED  . H/O hiatal hernia   . Complication of anesthesia HYPOTENSION INTRAOP W/ HYSTERECTOMY IN 1987--  DID OK W/ TOTAL KNEE IN 2008  . OA (osteoarthritis)   . History of peptic ulcer YRS AGO    Past Surgical History  Procedure Laterality Date  . Total knee arthroplasty  09-12-2006  [facility]    RIGHT KNEE  . Vaginal hysterectomy  1987  . Bunionectomy  2007    LEFT FOOT  . Tympanoplasty  1990;   1980  . Left shoulder surgery  1997    ROTATOR CUFF REPAIR  . Tubal ligation  1976  . Shoulder arthroscopy  09/20/2011    Procedure: ARTHROSCOPY SHOULDER;  Surgeon: Johnn Hai, MD;  Location: [facility];  Service: Orthopedics;  Laterality: Left;  SAD AND MINI OPEN ROTATOR CUFF REPAIR      History   Social History  . Marital Status: Married    Spouse Name: N/A  . Number of Children: N/A  . Years of Education: N/A   Occupational History  . Not on file.   Social History Main Topics  . Smoking status: Never Smoker   . Smokeless tobacco: Never Used  . Alcohol Use: No  . Drug Use: No  . Sexual Activity: Not on file   Other Topics Concern  . Not on  file   Social History Narrative    Current Outpatient Prescriptions on File Prior to Visit  Medication Sig Dispense Refill  . albuterol (PROVENTIL HFA;VENTOLIN HFA) 108 (90 BASE) MCG/ACT inhaler Inhale 2 puffs into the lungs every 6 (six) hours as needed. For shortness of breath.    Marland Kitchen albuterol (PROVENTIL) (2.5 MG/3ML) 0.083% nebulizer solution Take 3 mLs (2.5 mg total) by nebulization every 6 (six) hours as needed for wheezing or shortness of breath. 150 mL 1  . aspirin 325 MG tablet Take 325 mg by mouth daily.    Marland Kitchen docusate sodium (COLACE) 100 MG capsule Take 100 mg by mouth daily.    Marland Kitchen gabapentin (NEURONTIN) 300 MG capsule Take 1 capsule (300 mg total) by mouth 2 (two) times daily. 60 capsule 5  . glipiZIDE (GLUCOTROL) 5 MG tablet Take 1 tablet (5 mg total) by mouth daily before breakfast. 30 tablet 5  . HYDROcodone-acetaminophen (NORCO/VICODIN) 5-325 MG per tablet Take 1 tablet by mouth every 6 (six) hours as needed for moderate pain.    . Insulin Pen [name] 30G X 5 MM MISC 1 each by Does not apply route daily. 200 each 3  . lisinopril (PRINIVIL,ZESTRIL) 20 MG tablet TAKE 1 TABLET BY  MOUTH DAILY.  "OV NEEDED FOR FURTHER REFILLS" 30 tablet 0  . metFORMIN (GLUCOPHAGE) 500 MG tablet Take 2 tablets (1,000 mg total) by mouth 2 (two) times daily with a meal. PATIENT NEEDS OFFICE VISIT FOR ADDITIONAL REFILLS 120 tablet 0  . metoprolol tartrate (LOPRESSOR) 25 MG tablet Take 1 tablet (25 mg total) by mouth 2 (two) times daily. 60 tablet 5  . mometasone (ASMANEX) 220 MCG/INH inhaler Use 1 puff daily followed by rinsing your mouth out with water. (Patient taking differently: Inhale 1 puff into the lungs daily. followed by rinsing your mouth out with water.) 1 Inhaler 12  . sertraline (ZOLOFT) 25 MG tablet TAKE 1 TABLET BY MOUTH AT BEDTIME 90 tablet 1  . metFORMIN (GLUCOPHAGE) 500 MG tablet TAKE 2 TABLETS BY MOUTH TWICE DAILY WITH MEALS 380 tablet 0   No current facility-administered medications on  file prior to visit.    Allergies  Allergen Reactions  . Influenza Vaccines Anaphylaxis  . Codeine Itching  . Demerol Other (See Comments)    SEIZURE  . Nsaids Other (See Comments)    NAPROXEN, IBUPROFEN, SKELAXIN---  CAUSES PALPITATIONS  . Prednisone     Caused her glucose to go up to 500 and went to ED  . Tramadol     Contraindicated with Victoza     Family History  Problem Relation Age of Onset  . Heart disease Mother   . Diabetes Mother   . Kidney disease Mother   . Thyroid disease Mother   . Thyroid disease Brother   . Thyroid disease Sister   . Thyroid disease Paternal Grandmother     BP 128/85 mmHg  Pulse 70  Ht 5' 1.5" (1.562 m)  Wt 200 lb (90.719 kg)  BMI 37.18 kg/m2  SpO2 98%  Review of Systems denies blurry vision, headache, chest pain, n/v, urinary frequency, muscle cramps, excessive diaphoresis, memory loss, cold intolerance, and easy bruising.  She has lost 25 lbs x 6 months.  She has a slight dry cough, rhinorrhea, and urinary frequency.      Objective:   Physical Exam VS: see vs page GEN: no distress HEAD: head: no deformity eyes: no periorbital swelling, no proptosis external nose and ears are normal mouth: no lesion seen NECK: large goiter, R>L CHEST WALL: no deformity LUNGS:  Clear to auscultation.  CV: reg rate and rhythm, no murmur ABD: abdomen is soft, nontender.  no hepatosplenomegaly.  not distended.  no hernia MUSCULOSKELETAL: muscle bulk and strength are grossly normal.  no obvious joint swelling.  gait is normal and steady EXTEMITIES: no deformity.  no ulcer on the feet.  feet are of normal color and temp.  no edema.   PULSES: dorsalis pedis intact bilat.  no carotid bruit.   NEURO:  cn 2-12 grossly intact.   readily moves all 4's.  sensation is intact to touch on the feet.   SKIN:  Normal texture and temperature.  No rash or suspicious lesion is visible.  Old healed surgical scar (left bunionectomy). NODES:  None palpable at the  neck.  PSYCH: alert, well-oriented.  Does not appear anxious nor depressed.    Lab Results  Component Value Date   HGBA1C 8.7 07/10/2014   Lab Results  Component Value Date   TSH 0.283* 06/25/2014      Assessment & Plan:  Hyperthyroidism, new, uncertain etiology, but prob due to multinodular goiter.  DM: she needs increased rx CHF: as jardiance will have a diuretic effect, we'll need to  reduce lasix.  Patient is advised the following: Patient Instructions  good diet and exercise significantly improve the control of your diabetes.  please let me know if you wish to be referred to a dietician.  high blood sugar is very risky to your health.  you should see an eye doctor and dentist every year.  It is very important to get all recommended vaccinations.  controlling your blood pressure and cholesterol drastically reduces the damage diabetes does to your body.  Those who smoke should quit.  please discuss these with your doctor.  check your blood sugar once a day.  vary the time of day when you check, between before the 3 meals, and at bedtime.  also check if you have symptoms of your blood sugar being too high or too low.  please keep a record of the readings and bring it to your next appointment here.  You can write it on any piece of paper.  please call us sooner if your blood sugar goes below 70, or if you have a lot of readings over 200.   i have sent a prescription to your pharmacy, to add "jardiance." As this makes you urinate more, please reduce the furosemide to 20 mg daily. i have sent a prescription to your pharmacy let's check a thyroid "scan" (a special, but easy and painless type of thyroid x ray).  It works like this: you go to the x-ray department of the hospital to swallow a pill, which contains a miniscule amount of radiation.  You will not notice any symptoms from this.  You will go back to the x-ray department the next day, to lie down in front of a camera.  The results of this  will be sent to me.   Based on the results, i hope to order for you a treatment pill of radioactive iodine.  Although it is a larger amount of radiation, you will again notice no symptoms from this.  The pill is gone from your body in a few days (during which you should stay away from other people), but takes several months to work.  Therefore, please return here approximately 6-8 weeks after the treatment.  This treatment has been available for many years, and the only known side-effect is an underactive thyroid.  It is possible that i would eventually prescribe for you a thyroid hormone pill, which is very inexpensive.  You don't have to worry about side-effects of this thyroid hormone pill, because it is the same molecule your thyroid makes.   Please come back for a follow-up appointment in 6 weeks.

## 2014-08-20 ENCOUNTER — Ambulatory Visit (HOSPITAL_COMMUNITY)
Admission: RE | Admit: 2014-08-20 | Discharge: 2014-08-20 | Disposition: A | Discharge status: Home / Self Care | Payer: 59 | Source: Ambulatory Visit | Attending: Endocrinology | Admitting: Endocrinology

## 2014-08-20 DIAGNOSIS — E0590 Thyrotoxicosis, unspecified without thyrotoxic crisis or storm: Secondary | ICD-10-CM

## 2014-08-20 MED ORDER — SODIUM IODIDE I 131 CAPSULE
8.9000 | Freq: Once | INTRAVENOUS | Status: AC | PRN
  Administered 2014-08-20: 8.9 via ORAL

## 2014-08-21 ENCOUNTER — Encounter (HOSPITAL_COMMUNITY)
Admission: RE | Admit: 2014-08-21 | Discharge: 2014-08-21 | Disposition: A | Discharge status: Home / Self Care | Payer: 59 | Source: Ambulatory Visit | Attending: Endocrinology | Admitting: Endocrinology

## 2014-08-21 DIAGNOSIS — E0590 Thyrotoxicosis, unspecified without thyrotoxic crisis or storm: Secondary | ICD-10-CM | POA: Diagnosis present

## 2014-08-21 MED ORDER — SODIUM PERTECHNETATE TC 99M INJECTION
9.5000 | Freq: Once | INTRAVENOUS | Status: AC | PRN
  Administered 2014-08-21: 9.5 via INTRAVENOUS

## 2014-08-22 ENCOUNTER — Other Ambulatory Visit: Payer: Self-pay | Admitting: Endocrinology

## 2014-08-22 ENCOUNTER — Encounter: Payer: Self-pay | Admitting: Endocrinology

## 2014-08-22 DIAGNOSIS — E0590 Thyrotoxicosis, unspecified without thyrotoxic crisis or storm: Secondary | ICD-10-CM

## 2014-08-25 ENCOUNTER — Other Ambulatory Visit: Payer: Self-pay | Admitting: Physician Assistant

## 2014-08-30 ENCOUNTER — Encounter (HOSPITAL_COMMUNITY)
Admission: RE | Admit: 2014-08-30 | Discharge: 2014-08-30 | Disposition: A | Discharge status: Home / Self Care | Payer: 59 | Source: Ambulatory Visit | Attending: Endocrinology | Admitting: Endocrinology

## 2014-08-30 DIAGNOSIS — E0590 Thyrotoxicosis, unspecified without thyrotoxic crisis or storm: Secondary | ICD-10-CM | POA: Diagnosis present

## 2014-08-30 MED ORDER — SODIUM IODIDE I 131 CAPSULE
30.6000 | Freq: Once | INTRAVENOUS | Status: AC | PRN
  Administered 2014-08-30: 30.6 via ORAL

## 2014-09-02 ENCOUNTER — Other Ambulatory Visit: Payer: Self-pay | Admitting: Family Medicine

## 2014-09-05 ENCOUNTER — Ambulatory Visit: Payer: Self-pay

## 2014-09-08 ENCOUNTER — Other Ambulatory Visit: Payer: Self-pay | Admitting: Family Medicine

## 2014-09-08 DIAGNOSIS — F320 Major depressive disorder, single episode, mild: Secondary | ICD-10-CM

## 2014-09-09 NOTE — Telephone Encounter (Signed)
Dr Lorelei Pont, you saw pt for check up in June, but don't see depression discussed. Do you want to RF?

## 2014-09-19 ENCOUNTER — Encounter: Payer: Self-pay | Admitting: Endocrinology

## 2014-09-19 ENCOUNTER — Ambulatory Visit (INDEPENDENT_AMBULATORY_CARE_PROVIDER_SITE_OTHER): Payer: 59 | Admitting: Endocrinology

## 2014-09-19 VITALS — BP 128/80 | HR 97 | Temp 98.1°F | Ht 61.5 in | Wt 202.0 lb

## 2014-09-19 DIAGNOSIS — E119 Type 2 diabetes mellitus without complications: Secondary | ICD-10-CM | POA: Diagnosis not present

## 2014-09-19 LAB — POCT GLYCOSYLATED HEMOGLOBIN (HGB A1C): Hemoglobin A1C: 7.6

## 2014-09-19 MED ORDER — REPAGLINIDE 2 MG PO TABS: 2.0000 mg | ORAL_TABLET | Freq: Three times a day (TID) | ORAL | Status: DC

## 2014-09-19 NOTE — Progress Notes (Signed)
Subjective:    Patient ID: Sherry [name], female    DOB: 01-31-1951, 64 y.o.   MRN: [id number]  HPI Pt returns for f/u of diabetes mellitus: DM type: 2 Dx'ed: 4627 Complications: painful polyneuropathy. Therapy: insulin since GDM: never DKA: never Severe hypoglycemia: never Pancreatitis: never Other: she has never been on insulin; she did not tolerate victoza (sob) Interval history: pt states she feels well in general, except for fatigue.  no cbg record, but states cbg's are well-controlled.  She had I-131 for hyperthyroidism, 3 weeks ago.   Past Medical History  Diagnosis Date  . Diabetes mellitus   . Hypertension   . GERD (gastroesophageal reflux disease)   . Dyslipidemia   . Mitral valve prolapse occasional palpitations w/ chest discomfort  . DJD (degenerative joint disease)   . Meniere's disease   . Rotator cuff tear, left   . History of CHF (congestive heart failure) 2010-  RESOLVED  . H/O hiatal hernia   . Complication of anesthesia HYPOTENSION INTRAOP W/ HYSTERECTOMY IN 1987--  DID OK W/ TOTAL KNEE IN 2008  . OA (osteoarthritis)   . History of peptic ulcer YRS AGO    Past Surgical History  Procedure Laterality Date  . Total knee arthroplasty  09-12-2006  [facility]    RIGHT KNEE  . Vaginal hysterectomy  1987  . Bunionectomy  2007    LEFT FOOT  . Tympanoplasty  1990;   1980  . Left shoulder surgery  1997    ROTATOR CUFF REPAIR  . Tubal ligation  1976  . Shoulder arthroscopy  09/20/2011    Procedure: ARTHROSCOPY SHOULDER;  Surgeon: Johnn Hai, MD;  Location: [facility];  Service: Orthopedics;  Laterality: Left;  SAD AND MINI OPEN ROTATOR CUFF REPAIR      Social History   Social History  . Marital Status: Married    Spouse Name: N/A  . Number of Children: N/A  . Years of Education: N/A   Occupational History  . Not on file.   Social History Main Topics  . Smoking status: Never Smoker   . Smokeless tobacco: Never Used  . Alcohol  Use: No  . Drug Use: No  . Sexual Activity: Not on file   Other Topics Concern  . Not on file   Social History Narrative    Current Outpatient Prescriptions on File Prior to Visit  Medication Sig Dispense Refill  . albuterol (PROVENTIL HFA;VENTOLIN HFA) 108 (90 BASE) MCG/ACT inhaler Inhale 2 puffs into the lungs every 6 (six) hours as needed. For shortness of breath.    Marland Kitchen albuterol (PROVENTIL) (2.5 MG/3ML) 0.083% nebulizer solution Take 3 mLs (2.5 mg total) by nebulization every 6 (six) hours as needed for wheezing or shortness of breath. 150 mL 1  . aspirin 325 MG tablet Take 325 mg by mouth daily.    Marland Kitchen docusate sodium (COLACE) 100 MG capsule Take 100 mg by mouth daily.    . empagliflozin (JARDIANCE) 10 MG TABS tablet Take 10 mg by mouth daily. 30 tablet 11  . furosemide (LASIX) 20 MG tablet Take 1 tablet (20 mg total) by mouth daily. 30 tablet 3  . gabapentin (NEURONTIN) 300 MG capsule Take 1 capsule (300 mg total) by mouth 2 (two) times daily. 60 capsule 5  . HYDROcodone-acetaminophen (NORCO/VICODIN) 5-325 MG per tablet Take 1 tablet by mouth every 6 (six) hours as needed for moderate pain.    . Insulin Pen [name] 30G X 5 MM MISC 1 each  by Does not apply route daily. 200 each 3  . lisinopril (PRINIVIL,ZESTRIL) 20 MG tablet TAKE 1 TABLET BY MOUTH DAILY 30 tablet 4  . metFORMIN (GLUCOPHAGE) 500 MG tablet Take 2 tablets (1,000 mg total) by mouth 2 (two) times daily with a meal. PATIENT NEEDS OFFICE VISIT FOR ADDITIONAL REFILLS (Patient taking differently: Take 1,000 mg by mouth 2 (two) times daily with a meal. ) 120 tablet 0  . metoprolol tartrate (LOPRESSOR) 25 MG tablet Take 1 tablet (25 mg total) by mouth 2 (two) times daily. 60 tablet 5  . mometasone (ASMANEX) 220 MCG/INH inhaler Use 1 puff daily followed by rinsing your mouth out with water. (Patient taking differently: Inhale 1 puff into the lungs daily. followed by rinsing your mouth out with water.) 1 Inhaler 12  . sertraline  (ZOLOFT) 25 MG tablet TAKE 1 TABLET BY MOUTH AT BEDTIME 90 tablet 1   No current facility-administered medications on file prior to visit.    Allergies  Allergen Reactions  . Influenza Vaccines Anaphylaxis  . Codeine Itching  . Demerol Other (See Comments)    SEIZURE  . Nsaids Other (See Comments)    NAPROXEN, IBUPROFEN, SKELAXIN---  CAUSES PALPITATIONS  . Prednisone     Caused her glucose to go up to 500 and went to ED  . Tramadol     Contraindicated with Victoza     Family History  Problem Relation Age of Onset  . Heart disease Mother   . Diabetes Mother   . Kidney disease Mother   . Thyroid disease Mother   . Thyroid disease Brother   . Thyroid disease Sister   . Thyroid disease Paternal Grandmother     BP 128/80 mmHg  Pulse 97  Temp(Src) 98.1 F (36.7 C) (Oral)  Ht 5' 1.5" (1.562 m)  Wt 202 lb (91.627 kg)  BMI 37.55 kg/m2  SpO2 95%  Review of Systems She denies hypoglycemia    Objective:   Physical Exam VITAL SIGNS:  See vs page GENERAL: no distress NECK: large goiter, R>L Pulses: dorsalis pedis intact bilat.   MSK: no deformity of the feet CV: trace bilat leg edema Skin:  no ulcer on the feet.  normal color and temp on the feet. Neuro: sensation is intact to touch on the feet   A1c=7.6%     Assessment & Plan:  Edema, unchanged so far with med changes.  We'll follow.   DM: Needs increased rx, if it can be done with a regimen that avoids or minimizes hypoglycemia. Hyperthyroidism: i told pt this would be too soon to see improvement in hyperthyroidism, so we'll recheck TFT next time.  Patient is advised the following: Patient Instructions  check your blood sugar once a day.  vary the time of day when you check, between before the 3 meals, and at bedtime.  also check if you have symptoms of your blood sugar being too high or too low.  please keep a record of the readings and bring it to your next appointment here.  You can write it on any piece of  paper.  please call us sooner if your blood sugar goes below 70, or if you have a lot of readings over 200.   Please come back for a follow-up appointment in 1 month.   i have sent a prescription to your pharmacy, to change the glipizide to "repaglinide."

## 2014-09-19 NOTE — Patient Instructions (Addendum)
check your blood sugar once a day.  vary the time of day when you check, between before the 3 meals, and at bedtime.  also check if you have symptoms of your blood sugar being too high or too low.  please keep a record of the readings and bring it to your next appointment here.  You can write it on any piece of paper.  please call us sooner if your blood sugar goes below 70, or if you have a lot of readings over 200.   Please come back for a follow-up appointment in 1 month.   i have sent a prescription to your pharmacy, to change the glipizide to "repaglinide."

## 2014-10-21 ENCOUNTER — Ambulatory Visit (INDEPENDENT_AMBULATORY_CARE_PROVIDER_SITE_OTHER): Payer: 59 | Admitting: Endocrinology

## 2014-10-21 ENCOUNTER — Encounter: Payer: Self-pay | Admitting: Endocrinology

## 2014-10-21 ENCOUNTER — Other Ambulatory Visit (INDEPENDENT_AMBULATORY_CARE_PROVIDER_SITE_OTHER): Payer: 59

## 2014-10-21 VITALS — BP 126/82 | HR 58 | Temp 98.1°F | Ht 61.6 in | Wt 201.1 lb

## 2014-10-21 DIAGNOSIS — E0590 Thyrotoxicosis, unspecified without thyrotoxic crisis or storm: Secondary | ICD-10-CM

## 2014-10-21 LAB — TSH: TSH: 0.81 u[IU]/mL (ref 0.35–4.50)

## 2014-10-21 LAB — T4, FREE: Free T4: 0.96 ng/dL (ref 0.60–1.60)

## 2014-10-21 NOTE — Patient Instructions (Addendum)
check your blood sugar once a day.  vary the time of day when you check, between before the 3 meals, and at bedtime.  also check if you have symptoms of your blood sugar being too high or too low.  please keep a record of the readings and bring it to your next appointment here.  You can write it on any piece of paper.  please call us sooner if your blood sugar goes below 70, or if you have a lot of readings over 200.   blood tests are requested for you today.  We'll let you know about the results.   Please continue the same medications for diabetes.   Please come back for a follow-up appointment in 4-6 weeks.

## 2014-10-21 NOTE — Progress Notes (Signed)
Subjective:    Patient ID: Sherry [name], female    DOB: 03-20-1950, 64 y.o.   MRN: [id number]  HPI Pt returns for f/u of diabetes mellitus: DM type: 2 Dx'ed: 5366 Complications: painful polyneuropathy. Therapy: 3 oral meds.  GDM: never DKA: never Severe hypoglycemia: never Pancreatitis: never Other: she has never been on insulin; she did not tolerate victoza (sob) Interval history: pt states she feels well in general, except for fatigue.  no cbg record, but states cbg's vary from 59-187.   She had I-131 for hyperthyroidism, 7 weeks ago. Past Medical History  Diagnosis Date  . Diabetes mellitus   . Hypertension   . GERD (gastroesophageal reflux disease)   . Dyslipidemia   . Mitral valve prolapse occasional palpitations w/ chest discomfort  . DJD (degenerative joint disease)   . Meniere's disease   . Rotator cuff tear, left   . History of CHF (congestive heart failure) 2010-  RESOLVED  . H/O hiatal hernia   . Complication of anesthesia HYPOTENSION INTRAOP W/ HYSTERECTOMY IN 1987--  DID OK W/ TOTAL KNEE IN 2008  . OA (osteoarthritis)   . History of peptic ulcer YRS AGO    Past Surgical History  Procedure Laterality Date  . Total knee arthroplasty  09-12-2006  [facility]    RIGHT KNEE  . Vaginal hysterectomy  1987  . Bunionectomy  2007    LEFT FOOT  . Tympanoplasty  1990;   1980  . Left shoulder surgery  1997    ROTATOR CUFF REPAIR  . Tubal ligation  1976  . Shoulder arthroscopy  09/20/2011    Procedure: ARTHROSCOPY SHOULDER;  Surgeon: Johnn Hai, MD;  Location: [facility];  Service: Orthopedics;  Laterality: Left;  SAD AND MINI OPEN ROTATOR CUFF REPAIR      Social History   Social History  . Marital Status: Married    Spouse Name: N/A  . Number of Children: N/A  . Years of Education: N/A   Occupational History  . Not on file.   Social History Main Topics  . Smoking status: Never Smoker   . Smokeless tobacco: Never Used  . Alcohol Use:  No  . Drug Use: No  . Sexual Activity: Not on file   Other Topics Concern  . Not on file   Social History Narrative    Current Outpatient Prescriptions on File Prior to Visit  Medication Sig Dispense Refill  . albuterol (PROVENTIL HFA;VENTOLIN HFA) 108 (90 BASE) MCG/ACT inhaler Inhale 2 puffs into the lungs every 6 (six) hours as needed. For shortness of breath.    Marland Kitchen albuterol (PROVENTIL) (2.5 MG/3ML) 0.083% nebulizer solution Take 3 mLs (2.5 mg total) by nebulization every 6 (six) hours as needed for wheezing or shortness of breath. 150 mL 1  . aspirin 325 MG tablet Take 325 mg by mouth daily.    Marland Kitchen docusate sodium (COLACE) 100 MG capsule Take 100 mg by mouth daily.    . empagliflozin (JARDIANCE) 10 MG TABS tablet Take 10 mg by mouth daily. 30 tablet 11  . furosemide (LASIX) 20 MG tablet Take 1 tablet (20 mg total) by mouth daily. 30 tablet 3  . gabapentin (NEURONTIN) 300 MG capsule Take 1 capsule (300 mg total) by mouth 2 (two) times daily. 60 capsule 5  . HYDROcodone-acetaminophen (NORCO/VICODIN) 5-325 MG per tablet Take 1 tablet by mouth every 6 (six) hours as needed for moderate pain.    Marland Kitchen lisinopril (PRINIVIL,ZESTRIL) 20 MG tablet TAKE 1 TABLET  BY MOUTH DAILY 30 tablet 4  . metFORMIN (GLUCOPHAGE) 500 MG tablet Take 2 tablets (1,000 mg total) by mouth 2 (two) times daily with a meal. PATIENT NEEDS OFFICE VISIT FOR ADDITIONAL REFILLS (Patient taking differently: Take 1,000 mg by mouth 2 (two) times daily with a meal. ) 120 tablet 0  . metoprolol tartrate (LOPRESSOR) 25 MG tablet Take 1 tablet (25 mg total) by mouth 2 (two) times daily. 60 tablet 5  . mometasone (ASMANEX) 220 MCG/INH inhaler Use 1 puff daily followed by rinsing your mouth out with water. 1 Inhaler 12  . repaglinide (PRANDIN) 2 MG tablet Take 1 tablet (2 mg total) by mouth 3 (three) times daily before meals. 90 tablet 11  . sertraline (ZOLOFT) 25 MG tablet TAKE 1 TABLET BY MOUTH AT BEDTIME 90 tablet 1  . Insulin Pen [name]  30G X 5 MM MISC 1 each by Does not apply route daily. (Patient not taking: Reported on 10/21/2014) 200 each 3   No current facility-administered medications on file prior to visit.    Allergies  Allergen Reactions  . Influenza Vaccines Anaphylaxis  . Codeine Itching  . Demerol Other (See Comments)    SEIZURE  . Nsaids Other (See Comments)    NAPROXEN, IBUPROFEN, SKELAXIN---  CAUSES PALPITATIONS  . Prednisone     Caused her glucose to go up to 500 and went to ED  . Tramadol     Contraindicated with Victoza     Family History  Problem Relation Age of Onset  . Heart disease Mother   . Diabetes Mother   . Kidney disease Mother   . Thyroid disease Mother   . Thyroid disease Brother   . Thyroid disease Sister   . Thyroid disease Paternal Grandmother    BP 126/82 mmHg  Pulse 58  Temp(Src) 98.1 F (36.7 C) (Oral)  Ht 5' 1.6" (1.565 m)  Wt 201 lb 1 oz (91.201 kg)  BMI 37.24 kg/m2  SpO2 96%  Review of Systems No weight change    Objective:   Physical Exam VITAL SIGNS:  See vs page GENERAL: no distress Neck: large goiter is again noted.    Lab Results  Component Value Date   TSH 0.81 10/21/2014       Assessment & Plan:  Hyperthyroidism: better with I-131 rx. DM: well-controlled.  Patient is advised the following: Patient Instructions  check your blood sugar once a day.  vary the time of day when you check, between before the 3 meals, and at bedtime.  also check if you have symptoms of your blood sugar being too high or too low.  please keep a record of the readings and bring it to your next appointment here.  You can write it on any piece of paper.  please call us sooner if your blood sugar goes below 70, or if you have a lot of readings over 200.   blood tests are requested for you today.  We'll let you know about the results.   Please continue the same medications for diabetes.   Please come back for a follow-up appointment in 4-6 weeks.

## 2014-11-11 ENCOUNTER — Observation Stay (HOSPITAL_COMMUNITY): Payer: 59

## 2014-11-11 ENCOUNTER — Emergency Department (HOSPITAL_COMMUNITY): Payer: 59

## 2014-11-11 ENCOUNTER — Observation Stay (HOSPITAL_COMMUNITY)
Admission: EM | Admit: 2014-11-11 | Discharge: 2014-11-15 | Disposition: A | Discharge status: Home / Self Care | Payer: 59 | Attending: Family Medicine | Admitting: Family Medicine

## 2014-11-11 ENCOUNTER — Encounter (HOSPITAL_COMMUNITY): Payer: Self-pay | Admitting: Emergency Medicine

## 2014-11-11 ENCOUNTER — Ambulatory Visit (INDEPENDENT_AMBULATORY_CARE_PROVIDER_SITE_OTHER): Payer: 59 | Admitting: Family Medicine

## 2014-11-11 VITALS — BP 126/80 | HR 78 | Temp 98.7°F | Resp 18 | Ht 62.5 in | Wt 202.2 lb

## 2014-11-11 DIAGNOSIS — I10 Essential (primary) hypertension: Secondary | ICD-10-CM | POA: Insufficient documentation

## 2014-11-11 DIAGNOSIS — Z7982 Long term (current) use of aspirin: Secondary | ICD-10-CM | POA: Insufficient documentation

## 2014-11-11 DIAGNOSIS — R609 Edema, unspecified: Secondary | ICD-10-CM | POA: Diagnosis not present

## 2014-11-11 DIAGNOSIS — Z6836 Body mass index (BMI) 36.0-36.9, adult: Secondary | ICD-10-CM | POA: Insufficient documentation

## 2014-11-11 DIAGNOSIS — I6601 Occlusion and stenosis of right middle cerebral artery: Secondary | ICD-10-CM | POA: Insufficient documentation

## 2014-11-11 DIAGNOSIS — F419 Anxiety disorder, unspecified: Secondary | ICD-10-CM | POA: Insufficient documentation

## 2014-11-11 DIAGNOSIS — E118 Type 2 diabetes mellitus with unspecified complications: Secondary | ICD-10-CM | POA: Diagnosis not present

## 2014-11-11 DIAGNOSIS — R208 Other disturbances of skin sensation: Secondary | ICD-10-CM | POA: Diagnosis not present

## 2014-11-11 DIAGNOSIS — F329 Major depressive disorder, single episode, unspecified: Secondary | ICD-10-CM | POA: Insufficient documentation

## 2014-11-11 DIAGNOSIS — R410 Disorientation, unspecified: Secondary | ICD-10-CM

## 2014-11-11 DIAGNOSIS — I729 Aneurysm of unspecified site: Secondary | ICD-10-CM

## 2014-11-11 DIAGNOSIS — J45909 Unspecified asthma, uncomplicated: Secondary | ICD-10-CM | POA: Insufficient documentation

## 2014-11-11 DIAGNOSIS — R079 Chest pain, unspecified: Secondary | ICD-10-CM | POA: Insufficient documentation

## 2014-11-11 DIAGNOSIS — Z794 Long term (current) use of insulin: Secondary | ICD-10-CM | POA: Insufficient documentation

## 2014-11-11 DIAGNOSIS — R51 Headache: Secondary | ICD-10-CM | POA: Insufficient documentation

## 2014-11-11 DIAGNOSIS — Z79891 Long term (current) use of opiate analgesic: Secondary | ICD-10-CM | POA: Insufficient documentation

## 2014-11-11 DIAGNOSIS — K219 Gastro-esophageal reflux disease without esophagitis: Secondary | ICD-10-CM | POA: Insufficient documentation

## 2014-11-11 DIAGNOSIS — G458 Other transient cerebral ischemic attacks and related syndromes: Secondary | ICD-10-CM

## 2014-11-11 DIAGNOSIS — E1142 Type 2 diabetes mellitus with diabetic polyneuropathy: Secondary | ICD-10-CM | POA: Insufficient documentation

## 2014-11-11 DIAGNOSIS — Z79899 Other long term (current) drug therapy: Secondary | ICD-10-CM | POA: Insufficient documentation

## 2014-11-11 DIAGNOSIS — F32A Depression, unspecified: Secondary | ICD-10-CM

## 2014-11-11 DIAGNOSIS — F320 Major depressive disorder, single episode, mild: Secondary | ICD-10-CM

## 2014-11-11 DIAGNOSIS — F05 Delirium due to known physiological condition: Secondary | ICD-10-CM

## 2014-11-11 DIAGNOSIS — R0789 Other chest pain: Secondary | ICD-10-CM

## 2014-11-11 DIAGNOSIS — E785 Hyperlipidemia, unspecified: Secondary | ICD-10-CM | POA: Insufficient documentation

## 2014-11-11 DIAGNOSIS — G459 Transient cerebral ischemic attack, unspecified: Secondary | ICD-10-CM | POA: Diagnosis not present

## 2014-11-11 DIAGNOSIS — R29810 Facial weakness: Secondary | ICD-10-CM | POA: Insufficient documentation

## 2014-11-11 DIAGNOSIS — R200 Anesthesia of skin: Secondary | ICD-10-CM

## 2014-11-11 DIAGNOSIS — M1990 Unspecified osteoarthritis, unspecified site: Secondary | ICD-10-CM | POA: Insufficient documentation

## 2014-11-11 DIAGNOSIS — E669 Obesity, unspecified: Secondary | ICD-10-CM | POA: Insufficient documentation

## 2014-11-11 DIAGNOSIS — E0500 Thyrotoxicosis with diffuse goiter without thyrotoxic crisis or storm: Secondary | ICD-10-CM | POA: Insufficient documentation

## 2014-11-11 DIAGNOSIS — G629 Polyneuropathy, unspecified: Secondary | ICD-10-CM

## 2014-11-11 DIAGNOSIS — Z8673 Personal history of transient ischemic attack (TIA), and cerebral infarction without residual deficits: Secondary | ICD-10-CM | POA: Insufficient documentation

## 2014-11-11 DIAGNOSIS — R519 Headache, unspecified: Secondary | ICD-10-CM | POA: Insufficient documentation

## 2014-11-11 DIAGNOSIS — I671 Cerebral aneurysm, nonruptured: Secondary | ICD-10-CM | POA: Insufficient documentation

## 2014-11-11 DIAGNOSIS — Z7984 Long term (current) use of oral hypoglycemic drugs: Secondary | ICD-10-CM | POA: Insufficient documentation

## 2014-11-11 LAB — DIFFERENTIAL
Basophils Absolute: 0 10*3/uL (ref 0.0–0.1)
Basophils Relative: 1 %
Eosinophils Absolute: 0.2 10*3/uL (ref 0.0–0.7)
Eosinophils Relative: 4 %
Lymphocytes Relative: 46 %
Lymphs Abs: 3.1 10*3/uL (ref 0.7–4.0)
Monocytes Absolute: 0.4 10*3/uL (ref 0.1–1.0)
Monocytes Relative: 6 %
Neutro Abs: 2.8 10*3/uL (ref 1.7–7.7)
Neutrophils Relative %: 43 %

## 2014-11-11 LAB — CBC
HCT: 45.5 % (ref 36.0–46.0)
Hemoglobin: 14.3 g/dL (ref 12.0–15.0)
MCH: 28 pg (ref 26.0–34.0)
MCHC: 31.4 g/dL (ref 30.0–36.0)
MCV: 89.2 fL (ref 78.0–100.0)
Platelets: 266 10*3/uL (ref 150–400)
RBC: 5.1 MIL/uL (ref 3.87–5.11)
RDW: 14 % (ref 11.5–15.5)
WBC: 6.5 10*3/uL (ref 4.0–10.5)

## 2014-11-11 LAB — COMPREHENSIVE METABOLIC PANEL
ALT: 13 U/L — ABNORMAL LOW (ref 14–54)
AST: 20 U/L (ref 15–41)
Albumin: 3.7 g/dL (ref 3.5–5.0)
Alkaline Phosphatase: 71 U/L (ref 38–126)
Anion gap: 8 (ref 5–15)
BUN: 16 mg/dL (ref 6–20)
CO2: 25 mmol/L (ref 22–32)
Calcium: 8.8 mg/dL — ABNORMAL LOW (ref 8.9–10.3)
Chloride: 105 mmol/L (ref 101–111)
Creatinine, Ser: 0.7 mg/dL (ref 0.44–1.00)
GFR calc Af Amer: >60 mL/min (ref >60)
GFR calc non Af Amer: >60 mL/min (ref >60)
Glucose, Bld: 89 mg/dL (ref 65–99)
Potassium: 4.3 mmol/L (ref 3.5–5.1)
Sodium: 138 mmol/L (ref 135–145)
Total Bilirubin: 0.4 mg/dL (ref 0.3–1.2)
Total Protein: 6.4 g/dL — ABNORMAL LOW (ref 6.5–8.1)

## 2014-11-11 LAB — I-STAT TROPONIN, ED: Troponin i, poc: 0 ng/mL (ref 0.00–0.08)

## 2014-11-11 LAB — APTT: aPTT: 32 seconds (ref 24–37)

## 2014-11-11 LAB — PROTIME-INR
INR: 1.03 (ref 0.00–1.49)
Prothrombin Time: 13.7 seconds (ref 11.6–15.2)

## 2014-11-11 NOTE — ED Notes (Signed)
Pt reports intermittent tingling and drooping feeling to L side of face. C/o blurred vision to bilateral eyes. Pt sts she has been feeling tired and confused. Pt a&ox4. Reports today tingling started at 7am when she woke up and improved around 12 but never completely subsided. Grip strengths equal.

## 2014-11-11 NOTE — ED Provider Notes (Signed)
CSN: [id number]     Arrival date & time 11/11/14  1545 History   First MD Initiated Contact with Patient 11/11/14 1809     Chief Complaint  Patient presents with  . Stroke Symptoms     (Consider location/radiation/quality/duration/timing/severity/associated sxs/prior Treatment) HPI Comments: Here with intermittent L sided facial droop. No dysarthria. Intermittent for past 2 days. Improved at this time, but is still having some L face and L leg sensory changes. States this is worse than her prior TIAs. The TIAs were similar in deficit.  Patient is a 64 y.o. female presenting with neurologic complaint. The history is provided by the patient.  Neurologic Problem This is a new problem. The current episode started more than 2 days ago. The problem occurs constantly. Progression since onset: intermittent. Pertinent negatives include no abdominal pain and no shortness of breath.    Past Medical History  Diagnosis Date  . Diabetes mellitus   . Hypertension   . GERD (gastroesophageal reflux disease)   . Dyslipidemia   . Mitral valve prolapse occasional palpitations w/ chest discomfort  . DJD (degenerative joint disease)   . Meniere's disease   . Rotator cuff tear, left   . History of CHF (congestive heart failure) 2010-  RESOLVED  . H/O hiatal hernia   . Complication of anesthesia HYPOTENSION INTRAOP W/ HYSTERECTOMY IN 1987--  DID OK W/ TOTAL KNEE IN 2008  . OA (osteoarthritis)   . History of peptic ulcer YRS AGO   Past Surgical History  Procedure Laterality Date  . Total knee arthroplasty  09-12-2006  [facility]    RIGHT KNEE  . Vaginal hysterectomy  1987  . Bunionectomy  2007    LEFT FOOT  . Tympanoplasty  1990;   1980  . Left shoulder surgery  1997    ROTATOR CUFF REPAIR  . Tubal ligation  1976  . Shoulder arthroscopy  09/20/2011    Procedure: ARTHROSCOPY SHOULDER;  Surgeon: Johnn Hai, MD;  Location: [facility];  Service: Orthopedics;  Laterality: Left;  SAD AND  MINI OPEN ROTATOR CUFF REPAIR     Family History  Problem Relation Age of Onset  . Heart disease Mother   . Diabetes Mother   . Kidney disease Mother   . Thyroid disease Mother   . Thyroid disease Brother   . Thyroid disease Sister   . Thyroid disease Paternal Grandmother    Social History  Substance Use Topics  . Smoking status: Never Smoker   . Smokeless tobacco: Never Used  . Alcohol Use: No   OB History    No data available     Review of Systems  Constitutional: Negative for fever.  Respiratory: Negative for cough and shortness of breath.   Gastrointestinal: Negative for vomiting and abdominal pain.  All other systems reviewed and are negative.     Allergies  Influenza vaccines; Codeine; Demerol; Nsaids; Prednisone; and Tramadol  Home Medications   Prior to Admission medications   Medication Sig Start Date End Date Taking? Authorizing Provider  albuterol (PROVENTIL HFA;VENTOLIN HFA) 108 (90 BASE) MCG/ACT inhaler Inhale 2 puffs into the lungs every 6 (six) hours as needed. For shortness of breath.    Historical Provider, MD  albuterol (PROVENTIL) (2.5 MG/3ML) 0.083% nebulizer solution Take 3 mLs (2.5 mg total) by nebulization every 6 (six) hours as needed for wheezing or shortness of breath. 06/24/14   Darlyne Russian, MD  aspirin 325 MG tablet Take 325 mg by mouth daily.  Historical Provider, MD  cetirizine (ZYRTEC) 10 MG tablet Take 10 mg by mouth daily.    Historical Provider, MD  docusate sodium (COLACE) 100 MG capsule Take 100 mg by mouth daily.    Historical Provider, MD  empagliflozin (JARDIANCE) 10 MG TABS tablet Take 10 mg by mouth daily. 08/06/14   Renato Shin, MD  furosemide (LASIX) 20 MG tablet Take 1 tablet (20 mg total) by mouth daily. 08/06/14   Renato Shin, MD  gabapentin (NEURONTIN) 300 MG capsule Take 1 capsule (300 mg total) by mouth 2 (two) times daily. 07/10/14   Darreld Mclean, MD  HYDROcodone-acetaminophen (NORCO/VICODIN) 5-325 MG per tablet  Take 1 tablet by mouth every 6 (six) hours as needed for moderate pain.    Historical Provider, MD  Insulin Pen Needle 30G X 5 MM MISC 1 each by Does not apply route daily. 03/04/14   Chelle Jeffery, PA-C  lisinopril (PRINIVIL,ZESTRIL) 20 MG tablet TAKE 1 TABLET BY MOUTH DAILY 08/27/14   Gay Filler Copland, MD  metFORMIN (GLUCOPHAGE) 500 MG tablet Take 2 tablets (1,000 mg total) by mouth 2 (two) times daily with a meal. PATIENT NEEDS OFFICE VISIT FOR ADDITIONAL REFILLS Patient taking differently: Take 1,000 mg by mouth 2 (two) times daily with a meal.  12/19/13   Chelle Jeffery, PA-C  metoprolol tartrate (LOPRESSOR) 25 MG tablet Take 1 tablet (25 mg total) by mouth 2 (two) times daily. 07/10/14   Gay Filler Copland, MD  mometasone (ASMANEX) 220 MCG/INH inhaler Use 1 puff daily followed by rinsing your mouth out with water. 06/24/14   Darlyne Russian, MD  omeprazole (PRILOSEC) 20 MG capsule Take 20 mg by mouth.    Historical Provider, MD  repaglinide (PRANDIN) 2 MG tablet Take 1 tablet (2 mg total) by mouth 3 (three) times daily before meals. 09/19/14   Renato Shin, MD  sertraline (ZOLOFT) 25 MG tablet TAKE 1 TABLET BY MOUTH AT BEDTIME 09/09/14   Gay Filler Copland, MD   BP 102/89 mmHg  Pulse 63  Temp(Src) 97.9 F (36.6 C) (Oral)  Resp 18  Ht 5\' 2"  (1.575 m)  Wt 202 lb (91.627 kg)  BMI 36.94 kg/m2  SpO2 96% Physical Exam  Constitutional: She is oriented to person, place, and time. She appears well-developed and well-nourished. No distress.  HENT:  Head: Normocephalic and atraumatic.  Mouth/Throat: Oropharynx is clear and moist.  Eyes: EOM are normal. Pupils are equal, round, and reactive to light.  Neck: Normal range of motion. Neck supple.  Cardiovascular: Normal rate and regular rhythm.  Exam reveals no friction rub.   No murmur heard. Pulmonary/Chest: Effort normal and breath sounds normal. No respiratory distress. She has no wheezes. She has no rales.  Abdominal: Soft. She exhibits no  distension. There is no tenderness. There is no rebound.  Musculoskeletal: Normal range of motion. She exhibits no edema.  Neurological: She is alert and oriented to person, place, and time. A sensory deficit (reports mild alterd light touch on L leg and left side of face) is present. No cranial nerve deficit. She exhibits normal muscle tone. Coordination normal. GCS eye subscore is 4. GCS verbal subscore is 5. GCS motor subscore is 6.  Skin: She is not diaphoretic.  Nursing note and vitals reviewed.   ED Course  Procedures (including critical care time) Labs Review Labs Reviewed  COMPREHENSIVE METABOLIC PANEL - Abnormal; Notable for the following:    Calcium 8.8 (*)    Total Protein 6.4 (*)  ALT 13 (*)    All other components within normal limits  PROTIME-INR  APTT  CBC  DIFFERENTIAL  I-STAT TROPOININ, ED  CBG MONITORING, ED    Imaging Review Ct Head Wo Contrast  11/11/2014   CLINICAL DATA:  Headaches with left facial and mouth tingling, drooling and left eyelid weakness for 1 week. Initial encounter.  EXAM: CT HEAD WITHOUT CONTRAST  TECHNIQUE: Contiguous axial images were obtained from the base of the skull through the vertex without intravenous contrast.  COMPARISON:  Head CT 11/19/2011.  FINDINGS: There is no evidence of acute intracranial hemorrhage, mass lesion, brain edema or extra-axial fluid collection. The ventricles and subarachnoid spaces are appropriately sized for age. There is no CT evidence of acute cortical infarction.  The visualized paranasal sinuses, mastoid air cells and middle ears are clear. The calvarium is intact. Calvarial hyperostosis and prominent falcine calcification again noted.  IMPRESSION: Stable head CT. No acute intracranial findings or explanation for the patient's symptoms.   Electronically Signed   By: Richardean Sale M.D.   On: 11/11/2014 17:17   I have personally reviewed and evaluated these images and lab results as part of my medical  decision-making.   EKG Interpretation   Date/Time:  Monday November 11 2014 15:52:48 EDT Ventricular Rate:  61 PR Interval:  162 QRS Duration: 94 QT Interval:  460 QTC Calculation: 463 R Axis:   74 Text Interpretation:  Normal sinus rhythm Nonspecific T wave abnormality  Abnormal ECG No significant change since last tracing Confirmed by Mingo Amber   MD, [place] ([id number]) on 11/11/2014 7:02:28 PM      MDM   Final diagnoses:  TIA (transient ischemic attack)  Facial droop  TIA (transient ischemic attack)  Facial droop    64 year old female sent from urgent care for concern of stroke. She's had intermittent left facial droop, left leg tingling for the past 2 days. She's had TIAs before with similar symptoms, states these are worse than her prior TIAs. They are improved when compared to deficits present in the weekend. Labs are normal, CT is normal. Will admit for TA workup, MRI.    Evelina Bucy, MD 11/12/14 [phone]

## 2014-11-11 NOTE — H&P (Signed)
[facility] History and Physical  Sherry [name] UEA:[id number] DOB: 25-May-1950 DOA: 11/11/2014  Referring physician: Alyson Locket - [facility] PCP: [name], MD   Chief Complaint: L sided facial weakness and numbness of LUE  HPI: Sherry [name] is a 64 y.o. female  Left-sided facial droop and numbness. Intermittent. Started 2 days ago. Associated with occasional left upper extremity numbness. Currently asymptomatic. Denies any dysphagia, chest pain, shortness of breath, palpitations, neck stiffness. Intermittent headaches. Patient states that she's had TIAs before and this feels very similar to that. Nothing makes her symptoms better. Symptoms worsen with exertion.   Review of Systems:  Constitutional:  No weight loss, night sweats, Fevers,  HEENT:  No headaches, Difficulty swallowing,Tooth/dental problems,Sore throat,  Cardio-vascular:  No chest pain, Orthopnea, PND, swelling in lower extremities, anasarca, dizziness, palpitations  GI:  No heartburn, indigestion, abdominal pain, nausea, vomiting,  Resp:   No shortness of breath with exertion or at rest. No excess mucus, no productive cough, No non-productive cough, No coughing up of blood.No change in color of mucus.No wheezing.No chest wall deformity  Skin:  no rash or lesions.  GU:  no dysuria, change in color of urine, no urgency or frequency. No flank pain.  Musculoskeletal:   No joint pain or swelling. No decreased range of motion. No back pain.  Psych:  No change in mood or affect. No depression or anxiety. No memory loss.   Past Medical History  Diagnosis Date  . Diabetes mellitus   . Hypertension   . GERD (gastroesophageal reflux disease)   . Dyslipidemia   . Mitral valve prolapse occasional palpitations w/ chest discomfort  . DJD (degenerative joint disease)   . Meniere's disease   . Rotator cuff tear, left   . History of CHF (congestive heart failure) 2010-  RESOLVED  . H/O hiatal hernia   .  Complication of anesthesia HYPOTENSION INTRAOP W/ HYSTERECTOMY IN 1987--  DID OK W/ TOTAL KNEE IN 2008  . OA (osteoarthritis)   . History of peptic ulcer YRS AGO   Past Surgical History  Procedure Laterality Date  . Total knee arthroplasty  09-12-2006  [facility]    RIGHT KNEE  . Vaginal hysterectomy  1987  . Bunionectomy  2007    LEFT FOOT  . Tympanoplasty  1990;   1980  . Left shoulder surgery  1997    ROTATOR CUFF REPAIR  . Tubal ligation  1976  . Shoulder arthroscopy  09/20/2011    Procedure: ARTHROSCOPY SHOULDER;  Surgeon: Johnn Hai, MD;  Location: [facility];  Service: Orthopedics;  Laterality: Left;  SAD AND MINI OPEN ROTATOR CUFF REPAIR     Social History:  reports that she has never smoked. She has never used smokeless tobacco. She reports that she does not drink alcohol or use illicit drugs.  Allergies  Allergen Reactions  . Influenza Vaccines Anaphylaxis  . Codeine Itching  . Demerol Other (See Comments)    SEIZURE  . Nsaids Other (See Comments)    NAPROXEN, IBUPROFEN, SKELAXIN---  CAUSES PALPITATIONS  . Prednisone     Caused her glucose to go up to 500 and went to ED  . Tramadol     Contraindicated with Victoza     Family History  Problem Relation Age of Onset  . Heart disease Mother   . Diabetes Mother   . Kidney disease Mother   . Thyroid disease Mother   . Thyroid disease Brother   . Thyroid disease Sister   .  Thyroid disease Paternal Grandmother      Prior to Admission medications   Medication Sig Start Date End Date Taking? Authorizing Provider  albuterol (PROVENTIL HFA;VENTOLIN HFA) 108 (90 BASE) MCG/ACT inhaler Inhale 2 puffs into the lungs every 6 (six) hours as needed. For shortness of breath.   Yes Historical Provider, MD  albuterol (PROVENTIL) (2.5 MG/3ML) 0.083% nebulizer solution Take 3 mLs (2.5 mg total) by nebulization every 6 (six) hours as needed for wheezing or shortness of breath. 06/24/14  Yes Darlyne Russian, MD  aspirin  325 MG tablet Take 325 mg by mouth daily.   Yes Historical Provider, MD  cetirizine (ZYRTEC) 10 MG tablet Take 10 mg by mouth daily.   Yes Historical Provider, MD  docusate sodium (COLACE) 100 MG capsule Take 100 mg by mouth daily.   Yes Historical Provider, MD  empagliflozin (JARDIANCE) 10 MG TABS tablet Take 10 mg by mouth daily. 08/06/14  Yes Renato Shin, MD  furosemide (LASIX) 20 MG tablet Take 1 tablet (20 mg total) by mouth daily. 08/06/14  Yes Renato Shin, MD  gabapentin (NEURONTIN) 300 MG capsule Take 1 capsule (300 mg total) by mouth 2 (two) times daily. 07/10/14  Yes Gay Filler Copland, MD  HYDROcodone-acetaminophen (NORCO/VICODIN) 5-325 MG per tablet Take 1 tablet by mouth every 6 (six) hours as needed for moderate pain.   Yes Historical Provider, MD  lisinopril (PRINIVIL,ZESTRIL) 20 MG tablet TAKE 1 TABLET BY MOUTH DAILY 08/27/14  Yes Gay Filler Copland, MD  metFORMIN (GLUCOPHAGE) 500 MG tablet Take 2 tablets (1,000 mg total) by mouth 2 (two) times daily with a meal. PATIENT NEEDS OFFICE VISIT FOR ADDITIONAL REFILLS Patient taking differently: Take 1,000 mg by mouth 2 (two) times daily with a meal.  12/19/13  Yes Chelle Jeffery, PA-C  metoprolol tartrate (LOPRESSOR) 25 MG tablet Take 1 tablet (25 mg total) by mouth 2 (two) times daily. 07/10/14  Yes Gay Filler Copland, MD  mometasone (ASMANEX) 220 MCG/INH inhaler Use 1 puff daily followed by rinsing your mouth out with water. 06/24/14  Yes Darlyne Russian, MD  omeprazole (PRILOSEC) 20 MG capsule Take 20 mg by mouth.   Yes Historical Provider, MD  repaglinide (PRANDIN) 2 MG tablet Take 1 tablet (2 mg total) by mouth 3 (three) times daily before meals. 09/19/14  Yes Renato Shin, MD  sertraline (ZOLOFT) 25 MG tablet TAKE 1 TABLET BY MOUTH AT BEDTIME 09/09/14  Yes Gay Filler Copland, MD  Insulin Pen Needle 30G X 5 MM MISC 1 each by Does not apply route daily. 03/04/14   Harrison Mons, PA-C   Physical Exam: Filed Vitals:   11/11/14 1551 11/11/14 1915  11/11/14 2000 11/11/14 2213  BP: 102/89  138/76 117/47  Pulse: 63  61 61  Temp: 97.9 F (36.6 C) 98 F (36.7 C)  97.7 F (36.5 C)  TempSrc: Oral   Oral  Resp: 18  13 20   Height: 5\' 2"  (1.575 m)   5\' 2"  (1.575 m)  Weight: 91.627 kg (202 lb)   91.218 kg (201 lb 1.6 oz)  SpO2: 96%  99% 100%    Wt Readings from Last 3 Encounters:  11/11/14 91.218 kg (201 lb 1.6 oz)  11/11/14 91.717 kg (202 lb 3.2 oz)  10/21/14 91.201 kg (201 lb 1 oz)    General:  Appears calm and comfortable Eyes:  PERRL, normal lids, irises & conjunctiva ENT:  grossly normal hearing, lips & tongue Neck:  no LAD, masses or thyromegaly Cardiovascular:  RRR,  no m/r/g. No LE edema. Telemetry:  SR, no arrhythmias  Respiratory:  CTA bilaterally, no w/r/r. Normal respiratory effort. Abdomen:  soft, ntnd Skin:  no rash or induration seen on limited exam Musculoskeletal:  grossly normal tone BUE/BLE Psychiatric:  grossly normal mood and affect, speech fluent and appropriate Neurologic: cranial nerves II through XII intact, no dysmetria bilaterally, moves all extremity is in coordinated fashion, 5 out of 5 strength in upper and lower extremities bilaterally.          Labs on Admission:  Basic Metabolic Panel:  Recent Labs Lab 11/11/14 1558  NA 138  K 4.3  CL 105  CO2 25  GLUCOSE 89  BUN 16  CREATININE 0.70  CALCIUM 8.8*   Liver Function Tests:  Recent Labs Lab 11/11/14 1558  AST 20  ALT 13*  ALKPHOS 71  BILITOT 0.4  PROT 6.4*  ALBUMIN 3.7   No results for input(s): LIPASE, AMYLASE in the last 168 hours. No results for input(s): AMMONIA in the last 168 hours. CBC:  Recent Labs Lab 11/11/14 1558  WBC 6.5  NEUTROABS 2.8  HGB 14.3  HCT 45.5  MCV 89.2  PLT 266   Cardiac Enzymes: No results for input(s): CKTOTAL, CKMB, CKMBINDEX, TROPONINI in the last 168 hours.  BNP (last 3 results) No results for input(s): BNP in the last 8760 hours.  ProBNP (last 3 results) No results for input(s):  PROBNP in the last 8760 hours.  CBG: No results for input(s): GLUCAP in the last 168 hours.  Radiological Exams on Admission: Ct Head Wo Contrast  11/11/2014   CLINICAL DATA:  Headaches with left facial and mouth tingling, drooling and left eyelid weakness for 1 week. Initial encounter.  EXAM: CT HEAD WITHOUT CONTRAST  TECHNIQUE: Contiguous axial images were obtained from the base of the skull through the vertex without intravenous contrast.  COMPARISON:  Head CT 11/19/2011.  FINDINGS: There is no evidence of acute intracranial hemorrhage, mass lesion, brain edema or extra-axial fluid collection. The ventricles and subarachnoid spaces are appropriately sized for age. There is no CT evidence of acute cortical infarction.  The visualized paranasal sinuses, mastoid air cells and middle ears are clear. The calvarium is intact. Calvarial hyperostosis and prominent falcine calcification again noted.  IMPRESSION: Stable head CT. No acute intracranial findings or explanation for the patient's symptoms.   Electronically Signed   By: Richardean Sale M.D.   On: 11/11/2014 17:17   Mr Brain Wo Contrast  11/11/2014   CLINICAL DATA:  Intermittent tingling and left-sided facial droop. Some associated blurred vision. Symptoms began 14 hr ago and have been waxing and waning.  EXAM: MRI HEAD WITHOUT CONTRAST  TECHNIQUE: Multiplanar, multiecho pulse sequences of the brain and surrounding structures were obtained without intravenous contrast.  COMPARISON:  Head CT earlier same day.  FINDINGS: The brain has a normal appearance on all pulse sequences without evidence of malformation, atrophy, old or acute infarction, mass lesion, hemorrhage, hydrocephalus or extra-axial collection. No pituitary mass. No fluid in the sinuses, middle ears or mastoids. No skull or skullbase lesion. There is flow in the major vessels at the base of the brain. Major venous sinuses show flow.  IMPRESSION: Normal examination.   Electronically Signed    By: Nelson Chimes M.D.   On: 11/11/2014 21:04     Assessment/Plan Principal Problem:   TIA (transient ischemic attack) Active Problems:   Diabetes mellitus with complication [facility])   Essential hypertension   Dependent edema  Peripheral neuropathy (HCC)   Depression   L sided numbness and facial droop: symptoms resolved. Likely secondary to TIA. Note this is patient's second TIA (previous in 2013), and described with identical symptoms to current symptoms. CT and MRI negative for acute stroke or other process - Tele - Neuro checks - Neuro consult in am - PT / OT - RN swallow screen - Lipid and A1c - Carotid doppler, MRA, Echo - per oderset  DM: on injectables - A1c - SSI  HTN: normotensive - continue home lisinopril, metop  Asthma: - contionue pulmicort and albuterol  Dependent Edema: - continue home lasix  Peripheral neuropathy: - continu ehome neurontin  GERD: - continue ppi  Depression: - continue zoloft    Code Status: FULL  DVT Prophylaxis: Hep Family Communication: Husband  Disposition Plan: pending improvement     Sherry Sturdivant J, MD Family Medicine [facility] [email or web address] Password [email or web address]

## 2014-11-11 NOTE — ED Notes (Signed)
Hin, RN updated on pt. Prior to coming to room

## 2014-11-11 NOTE — Progress Notes (Signed)
[facility] [address], [place] [address] [phone]  Date:  11/11/2014   Name:  Sherry [name]   DOB:  [birth year]   MRN:  [id number]  PCP:  [name], MD    Chief Complaint: Left Side Facial Drooping; Fatigue; and Altered Mental Status   History of Present Illness:  Sherry [name] is a 64 y.o. very pleasant female patient who presents with the following:  Here today because they have noted intermittent left sided facial dropping for the last 3 days. Her husband has noted that she seems confused.  Pt notes that she feels very tired.   She has not noted any weakness/numbness in any particular part of her body but she generally feels tired No difficulty speaking or chewing She is not having any CP She notes a numb feeling in the left face She has had some TIAs in the past- most recent maybe 2 years ago per her report  I do not see any MRI brain in her record  Patient Active Problem List   Diagnosis Date Noted  . Dyspnea 08/06/2014  . Hyperthyroidism 08/06/2014  . Meniere's disease 06/07/2014  . History of CHF (congestive heart failure) 06/07/2014  . Essential hypertension, benign 06/13/2013  . Type II or unspecified type diabetes mellitus without mention of complication, not stated as uncontrolled 06/13/2013  . Goiter 06/13/2013  . Obesity, unspecified 06/13/2013    Past Medical History  Diagnosis Date  . Diabetes mellitus   . Hypertension   . GERD (gastroesophageal reflux disease)   . Dyslipidemia   . Mitral valve prolapse occasional palpitations w/ chest discomfort  . DJD (degenerative joint disease)   . Meniere's disease   . Rotator cuff tear, left   . History of CHF (congestive heart failure) 2010-  RESOLVED  . H/O hiatal hernia   . Complication of anesthesia HYPOTENSION INTRAOP W/ HYSTERECTOMY IN 1987--  DID OK W/ TOTAL KNEE IN 2008  . OA (osteoarthritis)   . History of peptic ulcer YRS AGO    Past Surgical History   Procedure Laterality Date  . Total knee arthroplasty  09-12-2006  [facility]    RIGHT KNEE  . Vaginal hysterectomy  1987  . Bunionectomy  2007    LEFT FOOT  . Tympanoplasty  1990;   1980  . Left shoulder surgery  1997    ROTATOR CUFF REPAIR  . Tubal ligation  1976  . Shoulder arthroscopy  09/20/2011    Procedure: ARTHROSCOPY SHOULDER;  Surgeon: Johnn Hai, MD;  Location: [facility];  Service: Orthopedics;  Laterality: Left;  SAD AND MINI OPEN ROTATOR CUFF REPAIR      Social History  Substance Use Topics  . Smoking status: Never Smoker   . Smokeless tobacco: Never Used  . Alcohol Use: No    Family History  Problem Relation Age of Onset  . Heart disease Mother   . Diabetes Mother   . Kidney disease Mother   . Thyroid disease Mother   . Thyroid disease Brother   . Thyroid disease Sister   . Thyroid disease Paternal Grandmother     Allergies  Allergen Reactions  . Influenza Vaccines Anaphylaxis  . Codeine Itching  . Demerol Other (See Comments)    SEIZURE  . Nsaids Other (See Comments)    NAPROXEN, IBUPROFEN, SKELAXIN---  CAUSES PALPITATIONS  . Prednisone     Caused her glucose to go up to 500 and went to ED  . Tramadol  Contraindicated with Victoza     Medication list has been reviewed and updated.  Current Outpatient Prescriptions on File Prior to Visit  Medication Sig Dispense Refill  . albuterol (PROVENTIL HFA;VENTOLIN HFA) 108 (90 BASE) MCG/ACT inhaler Inhale 2 puffs into the lungs every 6 (six) hours as needed. For shortness of breath.    Marland Kitchen albuterol (PROVENTIL) (2.5 MG/3ML) 0.083% nebulizer solution Take 3 mLs (2.5 mg total) by nebulization every 6 (six) hours as needed for wheezing or shortness of breath. 150 mL 1  . aspirin 325 MG tablet Take 325 mg by mouth daily.    . cetirizine (ZYRTEC) 10 MG tablet Take 10 mg by mouth daily.    Marland Kitchen docusate sodium (COLACE) 100 MG capsule Take 100 mg by mouth daily.    . empagliflozin (JARDIANCE) 10 MG  TABS tablet Take 10 mg by mouth daily. 30 tablet 11  . furosemide (LASIX) 20 MG tablet Take 1 tablet (20 mg total) by mouth daily. 30 tablet 3  . gabapentin (NEURONTIN) 300 MG capsule Take 1 capsule (300 mg total) by mouth 2 (two) times daily. 60 capsule 5  . HYDROcodone-acetaminophen (NORCO/VICODIN) 5-325 MG per tablet Take 1 tablet by mouth every 6 (six) hours as needed for moderate pain.    . Insulin Pen Needle 30G X 5 MM MISC 1 each by Does not apply route daily. 200 each 3  . lisinopril (PRINIVIL,ZESTRIL) 20 MG tablet TAKE 1 TABLET BY MOUTH DAILY 30 tablet 4  . metFORMIN (GLUCOPHAGE) 500 MG tablet Take 2 tablets (1,000 mg total) by mouth 2 (two) times daily with a meal. PATIENT NEEDS OFFICE VISIT FOR ADDITIONAL REFILLS (Patient taking differently: Take 1,000 mg by mouth 2 (two) times daily with a meal. ) 120 tablet 0  . metoprolol tartrate (LOPRESSOR) 25 MG tablet Take 1 tablet (25 mg total) by mouth 2 (two) times daily. 60 tablet 5  . mometasone (ASMANEX) 220 MCG/INH inhaler Use 1 puff daily followed by rinsing your mouth out with water. 1 Inhaler 12  . omeprazole (PRILOSEC) 20 MG capsule Take 20 mg by mouth.    . repaglinide (PRANDIN) 2 MG tablet Take 1 tablet (2 mg total) by mouth 3 (three) times daily before meals. 90 tablet 11  . sertraline (ZOLOFT) 25 MG tablet TAKE 1 TABLET BY MOUTH AT BEDTIME 90 tablet 1   No current facility-administered medications on file prior to visit.    Review of Systems:  As per HPI- otherwise negative.   Physical Examination: Filed Vitals:   11/11/14 1452  BP: 126/80  Pulse: 78  Temp: 98.7 F (37.1 C)  Resp: 18   Filed Vitals:   11/11/14 1452  Height: 5' 2.5" (1.588 m)  Weight: 202 lb 3.2 oz (91.717 kg)   Body mass index is 36.37 kg/(m^2). Ideal Body Weight: Weight in (lb) to have BMI = 25: 138.6  GEN: WDWN, NAD, Non-toxic, A & O x 3, obese, looks well HEENT: Atraumatic, Normocephalic. Neck supple. No masses, No LAD.  Oropharynx normal.   PEERL,EOMI.   Ears and Nose: No external deformity. CV: RRR, No M/G/R. No JVD. No thrill. No extra heart sounds. PULM: CTA B, no wheezes, crackles, rhonchi. No retractions. No resp. distress. No accessory muscle use. EXTR: No c/c/e NEURO Normal gait.  PSYCH: Normally interactive. Conversant. Not depressed or anxious appearing.  Calm demeanor.  At this time her face appears to be symmetrical Normal strength and DTR all extremities.  She has difficulty with rapidly alternating movements of  her hands.   She notes subjectively reduced sensation in her left face and also left sided limbs   Assessment and Plan: Left facial numbness  Subacute confusional state  Here today with concern of left sided drooping, confusion- advised pt and her husband that they need to proceed to the ER for further evaluation.  They will go to [facility]- he will drive Called to alert charge nurse   Signed [name], MD

## 2014-11-11 NOTE — Progress Notes (Signed)
Patient arrived from ED via tech and husband. VSS and reporting no pain. Oriented to room and equipment. Will continue to monitor. Joaquin Bend E, RN 11/11/2014 10:27 PM

## 2014-11-11 NOTE — ED Notes (Signed)
PT to MRI at this time  

## 2014-11-12 ENCOUNTER — Observation Stay (HOSPITAL_BASED_OUTPATIENT_CLINIC_OR_DEPARTMENT_OTHER): Payer: 59

## 2014-11-12 ENCOUNTER — Observation Stay (HOSPITAL_COMMUNITY): Payer: 59

## 2014-11-12 ENCOUNTER — Encounter (HOSPITAL_COMMUNITY): Payer: Self-pay | Admitting: Physician Assistant

## 2014-11-12 DIAGNOSIS — G459 Transient cerebral ischemic attack, unspecified: Secondary | ICD-10-CM

## 2014-11-12 DIAGNOSIS — R079 Chest pain, unspecified: Secondary | ICD-10-CM | POA: Insufficient documentation

## 2014-11-12 DIAGNOSIS — F329 Major depressive disorder, single episode, unspecified: Secondary | ICD-10-CM

## 2014-11-12 DIAGNOSIS — R609 Edema, unspecified: Secondary | ICD-10-CM

## 2014-11-12 DIAGNOSIS — G458 Other transient cerebral ischemic attacks and related syndromes: Secondary | ICD-10-CM | POA: Diagnosis not present

## 2014-11-12 DIAGNOSIS — G629 Polyneuropathy, unspecified: Secondary | ICD-10-CM

## 2014-11-12 DIAGNOSIS — E118 Type 2 diabetes mellitus with unspecified complications: Secondary | ICD-10-CM

## 2014-11-12 DIAGNOSIS — R0789 Other chest pain: Secondary | ICD-10-CM | POA: Diagnosis not present

## 2014-11-12 DIAGNOSIS — I10 Essential (primary) hypertension: Secondary | ICD-10-CM | POA: Diagnosis not present

## 2014-11-12 DIAGNOSIS — R29810 Facial weakness: Secondary | ICD-10-CM | POA: Insufficient documentation

## 2014-11-12 DIAGNOSIS — I729 Aneurysm of unspecified site: Secondary | ICD-10-CM | POA: Diagnosis not present

## 2014-11-12 DIAGNOSIS — F32A Depression, unspecified: Secondary | ICD-10-CM

## 2014-11-12 LAB — GLUCOSE, CAPILLARY
Glucose-Capillary: 181 mg/dL — ABNORMAL HIGH (ref 65–99)
Glucose-Capillary: 130 mg/dL — ABNORMAL HIGH (ref 65–99)
Glucose-Capillary: 110 mg/dL — ABNORMAL HIGH (ref 65–99)
Glucose-Capillary: 200 mg/dL — ABNORMAL HIGH (ref 65–99)
Glucose-Capillary: 132 mg/dL — ABNORMAL HIGH (ref 65–99)

## 2014-11-12 LAB — LIPID PANEL
Cholesterol: 150 mg/dL (ref 0–200)
HDL: 59 mg/dL (ref >40)
LDL Cholesterol: 78 mg/dL (ref 0–99)
Total CHOL/HDL Ratio: 2.5 RATIO
Triglycerides: 66 mg/dL (ref <150)
VLDL: 13 mg/dL (ref 0–40)

## 2014-11-12 MED ORDER — HEPARIN SODIUM (PORCINE) 5000 UNIT/ML IJ SOLN
5000.0000 [IU] | Freq: Three times a day (TID) | INTRAMUSCULAR | Status: DC
  Administered 2014-11-12 – 2014-11-15 (×6): 5000 [IU] via SUBCUTANEOUS
  Filled 2014-11-12 (×9): qty 1

## 2014-11-12 MED ORDER — GABAPENTIN 300 MG PO CAPS
300.0000 mg | ORAL_CAPSULE | Freq: Two times a day (BID) | ORAL | Status: DC
  Administered 2014-11-12 – 2014-11-15 (×8): 300 mg via ORAL
  Filled 2014-11-12 (×8): qty 1

## 2014-11-12 MED ORDER — LISINOPRIL 20 MG PO TABS
20.0000 mg | ORAL_TABLET | Freq: Every day | ORAL | Status: DC
  Administered 2014-11-12 – 2014-11-15 (×4): 20 mg via ORAL
  Filled 2014-11-12 (×4): qty 1

## 2014-11-12 MED ORDER — INSULIN ASPART 100 UNIT/ML ~~LOC~~ SOLN
0.0000 [IU] | Freq: Every day | SUBCUTANEOUS | Status: DC
Start: 2014-11-12 — End: 2014-11-15

## 2014-11-12 MED ORDER — INSULIN ASPART 100 UNIT/ML ~~LOC~~ SOLN
0.0000 [IU] | Freq: Three times a day (TID) | SUBCUTANEOUS | Status: DC
  Administered 2014-11-12: 2 [IU] via SUBCUTANEOUS
  Administered 2014-11-12: 1 [IU] via SUBCUTANEOUS
  Administered 2014-11-13 (×2): 2 [IU] via SUBCUTANEOUS
  Administered 2014-11-14 (×2): 1 [IU] via SUBCUTANEOUS
  Administered 2014-11-15: 2 [IU] via SUBCUTANEOUS

## 2014-11-12 MED ORDER — HYDROCODONE-ACETAMINOPHEN 5-325 MG PO TABS: 1.0000 | ORAL_TABLET | Freq: Four times a day (QID) | ORAL | Status: DC | PRN

## 2014-11-12 MED ORDER — BUDESONIDE 0.5 MG/2ML IN SUSP
0.5000 mg | Freq: Two times a day (BID) | RESPIRATORY_TRACT | Status: DC
  Administered 2014-11-12 – 2014-11-15 (×7): 0.5 mg via RESPIRATORY_TRACT
  Filled 2014-11-12 (×9): qty 2

## 2014-11-12 MED ORDER — SENNOSIDES-DOCUSATE SODIUM 8.6-50 MG PO TABS: 1.0000 | ORAL_TABLET | Freq: Every evening | ORAL | Status: DC | PRN

## 2014-11-12 MED ORDER — ASPIRIN 325 MG PO TABS
325.0000 mg | ORAL_TABLET | Freq: Every day | ORAL | Status: DC
  Administered 2014-11-12 – 2014-11-14 (×3): 325 mg via ORAL
  Filled 2014-11-12 (×3): qty 1

## 2014-11-12 MED ORDER — FUROSEMIDE 20 MG PO TABS
20.0000 mg | ORAL_TABLET | Freq: Every day | ORAL | Status: DC
Start: 2014-11-12 — End: 2014-11-15
  Administered 2014-11-12 – 2014-11-15 (×4): 20 mg via ORAL
  Filled 2014-11-12 (×4): qty 1

## 2014-11-12 MED ORDER — SERTRALINE HCL 50 MG PO TABS
25.0000 mg | ORAL_TABLET | Freq: Every day | ORAL | Status: DC
  Administered 2014-11-12 (×2): 25 mg via ORAL
  Filled 2014-11-12 (×2): qty 1

## 2014-11-12 MED ORDER — ALBUTEROL SULFATE (2.5 MG/3ML) 0.083% IN NEBU: 3.0000 mL | INHALATION_SOLUTION | Freq: Four times a day (QID) | RESPIRATORY_TRACT | Status: DC | PRN

## 2014-11-12 MED ORDER — METOPROLOL TARTRATE 25 MG PO TABS
25.0000 mg | ORAL_TABLET | Freq: Two times a day (BID) | ORAL | Status: DC
  Administered 2014-11-12 – 2014-11-15 (×7): 25 mg via ORAL
  Filled 2014-11-12 (×7): qty 1

## 2014-11-12 MED ORDER — DOCUSATE SODIUM 100 MG PO CAPS
100.0000 mg | ORAL_CAPSULE | Freq: Every day | ORAL | Status: DC
  Administered 2014-11-12 – 2014-11-15 (×4): 100 mg via ORAL
  Filled 2014-11-12 (×4): qty 1

## 2014-11-12 MED ORDER — PANTOPRAZOLE SODIUM 40 MG PO TBEC
40.0000 mg | DELAYED_RELEASE_TABLET | Freq: Every day | ORAL | Status: DC
  Administered 2014-11-12 – 2014-11-15 (×4): 40 mg via ORAL
  Filled 2014-11-12 (×4): qty 1

## 2014-11-12 MED ORDER — STROKE: EARLY STAGES OF RECOVERY BOOK
Freq: Once | Status: AC
  Administered 2014-11-12: 01:00:00

## 2014-11-12 NOTE — Evaluation (Signed)
Occupational Therapy Evaluation Patient Details Name: Sherry [name] MRN: [id number] DOB: 17-Dec-1950 Today's Date: 11/12/2014    History of Present Illness 64 y.o. female with HTN, DM, dyslipidemia, MVP, who states she awoke on Saturday and felt she had a facial droop, left eye ptosis and decreased sensation on her left face. She also noted a stabbing sensation in her left shoulder and under the arm pit. In addition she had some tearing of her left eye and slight drooling of her left aspect of her mouth. MRI negative for infarct.   Clinical Impression   Pt admitted with above. Pt independent with ADLs, PTA. Feel pt will benefit from acute OT to increase independence prior to d/c.     Follow Up Recommendations  Home health OT;Supervision - Intermittent    Equipment Recommendations  3 in 1 bedside comode (flat shower chair versus tub bench)    Recommendations for Other Services Speech consult     Precautions / Restrictions Restrictions Weight Bearing Restrictions: No      Mobility Bed Mobility Overal bed mobility: Modified Independent                Transfers Overall transfer level: Modified independent                    Balance   History of falls.                                           ADL Overall ADL's : Needs assistance/impaired     Grooming: Wash/dry hands;Standing;Set up       Lower Body Bathing: Set up;Supervison/ safety (standing)       Lower Body Dressing: Set up;Supervision/safety;Sit to/from stand   Toilet Transfer: Supervision/safety;Ambulation;Regular Toilet;Grab bars   Toileting- Clothing Manipulation and Hygiene: Supervision/safety;Sit to/from stand       Functional mobility during ADLs: Min guard;Supervision/safety General ADL Comments: Discussed tub transfer options and options for shower chair. Discussed 3 in 1.  Pt reported difficulty with tub transfer at home.     Vision Pt reports blurry  vision for last two weeks Vision Assessment?: Yes Visual Fields:  (pt moving eyes; inconsistent in left superior quadrant)   Perception     Praxis      Pertinent Vitals/Pain Pain Assessment: 0-10 Pain Score: 5  Pain Location: left knee Pain Descriptors / Indicators: Grimacing Pain Intervention(s): Monitored during session     Hand Dominance     Extremity/Trunk Assessment Upper Extremity Assessment Upper Extremity Assessment: Overall WFL for tasks assessed   Lower Extremity Assessment Lower Extremity Assessment: Defer to PT evaluation       Communication Communication Communication: Expressive difficulties   Cognition Arousal/Alertness: Awake/alert Behavior During Therapy: WFL for tasks assessed/performed Overall Cognitive Status:  (reports decreased memory)                     General Comments       Exercises       Shoulder Instructions      Home Living Family/patient expects to be discharged to:: Private residence Living Arrangements: Spouse/significant other;Other (Comment) (grandson; reports spouse not in great health) Available Help at Discharge: Family Type of Home: House Home Access: Stairs to enter     Home Layout: Two level;Able to live on main level with bedroom/bathroom     Bathroom Shower/Tub: Other (comment) (garden tub/shower combined)  Bathroom Toilet: Standard     Home Equipment: Grab bars - tub/shower          Prior Functioning/Environment Level of Independence: Independent             OT Diagnosis: Disturbance of vision;Other (comment) (decreased balance)   OT Problem List: Impaired vision/perception;Impaired balance (sitting and/or standing);Decreased knowledge of use of DME or AE;Pain;Obesity;Decreased cognition;Impaired sensation   OT Treatment/Interventions: Self-care/ADL training;DME and/or AE instruction;Therapeutic activities;Cognitive remediation/compensation;Patient/family education;Visual/perceptual  remediation/compensation;Balance training    OT Goals(Current goals can be found in the care plan section) Acute Rehab OT Goals Patient Stated Goal: not stated OT Goal Formulation: With patient Time For Goal Achievement: 11/19/14 Potential to Achieve Goals: Good ADL Goals Pt Will Perform Lower Body Dressing: with modified independence;sit to/from stand Pt Will Perform Tub/Shower Transfer: Tub transfer;with set-up;ambulating (shower seat versus tub bench) Additional ADL Goal #1: Pt will participate in further vision assessment.  OT Frequency: Min 2X/week   Barriers to D/C:            Co-evaluation              End of Session Nurse Communication: Other (comment) (mobility status-nursing student)  Activity Tolerance: Patient tolerated treatment well Patient left: in bed;with call bell/phone within reach;with bed alarm set;with family/visitor present   Time: 1517-6160 OT Time Calculation (min): 23 min Charges:  OT General Charges $OT Visit: 1 Procedure OT Evaluation $Initial OT Evaluation Tier I: 1 Procedure G-CodesBenito Mccreedy OTR/L [id number] 11/12/2014, 12:29 PM

## 2014-11-12 NOTE — Progress Notes (Signed)
VASCULAR LAB PRELIMINARY  PRELIMINARY  PRELIMINARY  PRELIMINARY  Carotid duplex  completed.    Preliminary report:  Bilateral:  1-39% ICA stenosis.  Vertebral artery flow is antegrade.      Dabid Godown, RVT 11/12/2014, 4:31 PM

## 2014-11-12 NOTE — Progress Notes (Signed)
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 Pt. With TIA symptoms on admission that have resolved. We have consulted Neurointerventional Radiology - Dr. Estanislado Pandy. They will see her in the am.   Paula Compton, MD Family Medicine - PGY 2

## 2014-11-12 NOTE — Progress Notes (Signed)
Spoke with FM teaching service,who will assume care of this patient.

## 2014-11-12 NOTE — Evaluation (Signed)
Physical Therapy Evaluation Patient Details Name: Sherry [name] MRN: [id number] DOB: 12-23-1950 Today's Date: 11/12/2014   History of Present Illness  64 y.o. female with HTN, DM, dyslipidemia, MVP, who states she awoke on Saturday and felt she had a facial droop, left eye ptosis and decreased sensation on her left face. She also noted a stabbing sensation in her left shoulder and under the arm pit. In addition she had some tearing of her left eye and slight drooling of her left aspect of her mouth. MRI negative for infarct.    Clinical Impression  Patient evaluated by Physical Therapy with no further acute PT needs identified. Patient with varying strength demonstrated in LLE and no problems with speaking (intermittent problems noted with OT--SLP reported no deficits). All education has been completed and the patient has no further questions. Patient agrees she is at her baseline. PT is signing off. Thank you for this referral.     Follow Up Recommendations No PT follow up    Equipment Recommendations  None recommended by PT    Recommendations for Other Services       Precautions / Restrictions Precautions Precautions: Fall Precaution Comments: Pt reports several falls over past 5 yrs--all trips or slips as she recounted each Restrictions Weight Bearing Restrictions: No      Mobility  Bed Mobility Overal bed mobility: Modified Independent             General bed mobility comments: used rail stating she has a tray with a hook beside her bed she can pull on (or uses her cane to help push herself up)  Transfers Overall transfer level: Modified independent Equipment used: None (vs grab bar) Transfers: Sit to/from Stand Sit to Stand: Modified independent (Device/Increase time)         General transfer comment: various surfaces, with and without use of arms, +grab bar at toilet  Ambulation/Gait Ambulation/Gait assistance: Supervision Ambulation Distance (Feet):  200 Feet Assistive device: None Gait Pattern/deviations: Step-through pattern;Decreased stride length     General Gait Details: no staggering with gait; good ability to change velocity; able to stop quickly and pivot turn without problems  Stairs Stairs: Yes Stairs assistance: Supervision Stair Management: One rail Right;Alternating pattern;Forwards Number of Stairs: 5 General stair comments: pt holds onto post or wall or cane; lightly used handrail to simulate  Wheelchair Mobility    Modified Rankin (Stroke Patients Only) Modified Rankin (Stroke Patients Only) Pre-Morbid Rankin Score: No significant disability Modified Rankin: No significant disability     Balance Overall balance assessment: Needs assistance Sitting-balance support: No upper extremity supported;Feet unsupported Sitting balance-Leahy Scale: Good Sitting balance - Comments: able to tolerate mutliple challenges/tasks at EOB with feet not on floor   Standing balance support: No upper extremity supported Standing balance-Leahy Scale: Good Standing balance comment: with dynamic activities in room, she had a stagger step twice with independent recovery   Single Leg Stance - Left Leg: 10 Tandem Stance - Right Leg: 20   Rhomberg - Eyes Opened: 20 Rhomberg - Eyes Closed: 10     Standardized Balance Assessment Standardized Balance Assessment : Dynamic Gait Index;Berg Balance Test Berg Balance Test Sit to Stand: Able to stand  independently using hands Standing Unsupported: Able to stand safely 2 minutes Sitting with Back Unsupported but Feet Supported on Floor or Stool: Able to sit safely and securely 2 minutes Stand to Sit: Sits safely with minimal use of hands Transfers: Able to transfer safely, minor use of hands Standing Unsupported with  Eyes Closed: Able to stand 10 seconds safely Standing Ubsupported with Feet Together: Able to place feet together independently and stand for 1 minute with  supervision From Standing, Reach Forward with Outstretched Arm: Can reach confidently >25 cm (10") From Standing Position, Pick up Object from Floor: Able to pick up shoe safely and easily From Standing Position, Turn to Look Behind Over each Shoulder: Looks behind from both sides and weight shifts well Turn 360 Degrees: Able to turn 360 degrees safely in 4 seconds or less Standing Unsupported, Alternately Place Feet on Step/Stool: Needs assistance to keep from falling or unable to try (posterior loss of Balance x 1) Standing Unsupported, One Foot in Front: Able to plae foot ahead of the other independently and hold 30 seconds Standing on One Leg: Able to lift leg independently and hold 5-10 seconds Total Score: 48 Dynamic Gait Index Level Surface: Normal Change in Gait Speed: Normal Gait with Horizontal Head Turns: Normal Gait and Pivot Turn: Mild Impairment Steps: Mild Impairment       Pertinent Vitals/Pain Pain Assessment: No/denies pain Pain Score: 5  Pain Location: left knee Pain Descriptors / Indicators: Grimacing Pain Intervention(s): Monitored during session    Home Living Family/patient expects to be discharged to:: Private residence Living Arrangements: Spouse/significant other Available Help at Discharge: Family Type of Home: House Home Access: Stairs to enter Entrance Stairs-Rails: None (outside has porch post and cane; entry room to house has wal) Entrance Stairs-Number of Steps: 2+2 Home Layout: Two level;Able to live on main level with bedroom/bathroom Home Equipment: Grab bars - tub/shower;Cane - single point Additional Comments: husband present and moving about room without difficulty    Prior Function Level of Independence: Independent with assistive device(s)         Comments: uses cane at times (especially if she goes out)     Hand Dominance        Extremity/Trunk Assessment   Upper Extremity Assessment: Defer to OT evaluation;Overall [facility] for  tasks assessed           Lower Extremity Assessment: Overall WFL for tasks assessed   LLE Deficits / Details: inconsistent knee extension strength (at some points in ROM 4/5 at others 3/5; with repeated testing in different positions, strength "improved" and was [facility]  Cervical / Trunk Assessment: Normal  Communication   Communication: No difficulties  Cognition Arousal/Alertness: Awake/alert Behavior During Therapy: WFL for tasks assessed/performed Overall Cognitive Status: Within Functional Limits for tasks assessed                      General Comments      Exercises        Assessment/Plan    PT Assessment Patent does not need any further PT services  PT Diagnosis Difficulty walking   PT Problem List    PT Treatment Interventions     PT Goals (Current goals can be found in the Care Plan section) Acute Rehab PT Goals Patient Stated Goal: find out what's causing headache PT Goal Formulation: All assessment and education complete, DC therapy    Frequency     Barriers to discharge        Co-evaluation               End of Session Equipment Utilized During Treatment: Gait belt Activity Tolerance: Patient tolerated treatment well Patient left: in chair;with call bell/phone within reach;with chair alarm set Nurse Communication: Mobility status (inconsistent performance at times)    Functional Assessment  Tool Used: clinical judgement Functional Limitation: Mobility: Walking and moving around Mobility: Walking and Moving Around Current Status [phone]): At least 1 percent but less than 20 percent impaired, limited or restricted Mobility: Walking and Moving Around Goal Status [phone]): At least 1 percent but less than 20 percent impaired, limited or restricted Mobility: Walking and Moving Around Discharge Status [phone]): At least 1 percent but less than 20 percent impaired, limited or restricted    Time: 1333-1403 PT Time Calculation (min) (ACUTE ONLY):  30 min   Charges:   PT Evaluation $Initial PT Evaluation Tier I: 1 Procedure PT Treatments $Gait Training: 8-22 mins   PT G Codes:   PT G-Codes **NOT FOR INPATIENT CLASS** Functional Assessment Tool Used: clinical judgement Functional Limitation: Mobility: Walking and moving around Mobility: Walking and Moving Around Current Status (U3833): At least 1 percent but less than 20 percent impaired, limited or restricted Mobility: Walking and Moving Around Goal Status [phone]): At least 1 percent but less than 20 percent impaired, limited or restricted Mobility: Walking and Moving Around Discharge Status [phone]): At least 1 percent but less than 20 percent impaired, limited or restricted    Nereyda Bowler 11/12/2014, 2:41 PM Pager [phone]

## 2014-11-12 NOTE — Progress Notes (Signed)
PT Cancellation Note  Patient Details Name: Lauralee Waters MRN: [id number] DOB: [birth year]   Cancelled Treatment:    Reason Eval/Treat Not Completed: Patient at procedure or test/unavailable. Pt gone for MRI? Will re-attempt later today (as appropriate--? symtoms have resolved)   Cristela Stalder 11/12/2014, 9:17 AM  Pager [phone]

## 2014-11-12 NOTE — Progress Notes (Signed)
Nutrition Brief Note  Patient identified on the Malnutrition Screening Tool (MST) Report  Wt Readings from Last 15 Encounters:  11/11/14 201 lb 1.6 oz (91.218 kg)  11/11/14 202 lb 3.2 oz (91.717 kg)  10/21/14 201 lb 1 oz (91.201 kg)  09/19/14 202 lb (91.627 kg)  08/06/14 200 lb (90.719 kg)  07/10/14 201 lb 3.2 oz (91.264 kg)  02/27/14 212 lb (96.163 kg)  02/09/14 208 lb (94.348 kg)  06/29/13 213 lb (96.616 kg)  06/13/13 213 lb (96.616 kg)  09/20/11 221 lb (100.245 kg)    Body mass index is 36.77 kg/(m^2). Patient meets criteria for Obesity based on current BMI. No evidence of weight loss in the past 3 months and minimal weight loss in the past year. Pt denies having a decreased appetite or eating poorly.   Current diet order is Heart Healthy/Carb Modified, patient is consuming approximately 90% of meals at this time. Labs and medications reviewed.   No nutrition interventions warranted at this time. If nutrition issues arise, please consult RD.   Scarlette Ar RD, LDN Inpatient Clinical Dietitian Pager: [phone] After Hours Pager: [phone]

## 2014-11-12 NOTE — Evaluation (Signed)
Speech Language Pathology Evaluation Patient Details Name: Sherry [name] MRN: [id number] DOB: 04-18-1950 Today's Date: 11/12/2014 Time: 1200-1220 SLP Time Calculation (min) (ACUTE ONLY): 20 min  Problem List:  Patient Active Problem List   Diagnosis Date Noted  . Diabetes mellitus with complication ([place]) 81/27/5170  . Essential hypertension 11/12/2014  . Dependent edema 11/12/2014  . Peripheral neuropathy ([place]) 11/12/2014  . Depression 11/12/2014  . Facial droop   . TIA (transient ischemic attack) 11/11/2014  . Dyspnea 08/06/2014  . Hyperthyroidism 08/06/2014  . Meniere's disease 06/07/2014  . History of CHF (congestive heart failure) 06/07/2014  . Essential hypertension, benign 06/13/2013  . Type II or unspecified type diabetes mellitus without mention of complication, not stated as uncontrolled 06/13/2013  . Goiter 06/13/2013  . Obesity, unspecified 06/13/2013   Past Medical History:  Past Medical History  Diagnosis Date  . Diabetes mellitus   . Hypertension   . GERD (gastroesophageal reflux disease)   . Dyslipidemia   . Mitral valve prolapse occasional palpitations w/ chest discomfort  . DJD (degenerative joint disease)   . Meniere's disease   . Rotator cuff tear, left   . History of CHF (congestive heart failure) 2010-  RESOLVED  . H/O hiatal hernia   . Complication of anesthesia HYPOTENSION INTRAOP W/ HYSTERECTOMY IN 1987--  DID OK W/ TOTAL KNEE IN 2008  . OA (osteoarthritis)   . History of peptic ulcer YRS AGO   Past Surgical History:  Past Surgical History  Procedure Laterality Date  . Total knee arthroplasty  09-12-2006  [facility]    RIGHT KNEE  . Vaginal hysterectomy  1987  . Bunionectomy  2007    LEFT FOOT  . Tympanoplasty  1990;   1980  . Left shoulder surgery  1997    ROTATOR CUFF REPAIR  . Tubal ligation  1976  . Shoulder arthroscopy  09/20/2011    Procedure: ARTHROSCOPY SHOULDER;  Surgeon: Johnn Hai, MD;  [facility];   Service: Orthopedics;  Laterality: Left;  SAD AND MINI OPEN ROTATOR CUFF REPAIR     HPI:    MRI negative. Pt reports facial numbness/tingling sensation returns intermittently. Currently resolved.  Assessment / Plan / Recommendation Clinical Impression  Pt reports to have returned to baseline. No apparent com/cog issues. Pt was encouraged to contact MD if needs arise.     SLP Assessment  Patient does not need any further Speech Lanaguage Pathology Services          Pertinent Vitals/Pain Pain Assessment: No/denies pain Pain Score: 5  Pain Location: left knee Pain Descriptors / Indicators: Grimacing Pain Intervention(s): Monitored during session   SLP Goals  Patient/Family Stated Goal: DC home  SLP Evaluation Prior Functioning  Cognitive/Linguistic Baseline: Within functional limits Type of Home: House  Lives With: Spouse Available Help at Discharge: Family   Cognition  Overall Cognitive Status: Within Functional Limits for tasks assessed Arousal/Alertness: Awake/alert Orientation Level: Oriented X4 Memory: Appears intact (pt mentioned to OT that she had some difficulty with recall. None reported or evident at this time.) Awareness: Appears intact Problem Solving: Appears intact Safety/Judgment: Appears intact    Comprehension  Auditory Comprehension Overall Auditory Comprehension: Appears within functional limits for tasks assessed    Expression Expression Primary Mode of Expression: Verbal Verbal Expression Overall Verbal Expression: Appears within functional limits for tasks assessed   Oral / Motor Oral Motor/Sensory Function Overall Oral Motor/Sensory Function: Appears within functional limits for tasks assessed Motor Speech Overall Motor Speech: Appears within  functional limits for tasks assessed   GO Functional Assessment Tool Used: asha noms, clinical judgment Functional Limitations: Attention Attention Current Status (Y3016): 0 percent impaired, limited or  restricted Attention Goal Status (W1093): 0 percent impaired, limited or restricted Attention Discharge Status (A3557): 0 percent impaired, limited or restricted   Shonna Chock 11/12/2014, 12:58 PM   Naif Alabi [facility], [place] [phone]

## 2014-11-12 NOTE — Discharge Summary (Signed)
[facility] Discharge Summary  Patient name: Sherry [name] Medical record number: [id number] Date of birth: 13-Jan-1951 Age: 64 y.o. Gender: female Date of Admission: 11/11/2014  Date of Discharge: 11/15/2014  Admitting Physician: Waldemar Dickens, MD  Primary Care Provider: [name], MD Consultants: neurology; cardiology; neuro IR; EP  Indication for Hospitalization: TIA  Discharge Diagnoses/Problem List:  TIA Chest pain/angina symptoms  54mm MCA Aneurysm DMII Hypertension Asthma Hyperthyroidism Dependent edema Peripheral neuropathy GERD Depression/anxiety  Disposition: home  Discharge Condition: good  Discharge Exam:  General: Overweight female in NAD resting in bed and eating breakfast Cardiovascular: RRR, no m/r/g Respiratory: CTAB Abdomen: NTND +BS Extremities: no LE edema, rashes or wounds  Brief Hospital Course:  Pt reports L sided facial droop and LUE weakness, as well as LLE decreased sensation. Pt had a similar TIA in 2013, which also began with L sided facial droop. Pt also reported new chest tightness intermittently with exertion over the month prior to admission with associated SOB and diaphoresis. Reports taking her ASA 325mg  "religiously." CT head Negative, MRI normal. Carotid dopplers bilaterally 1-39% stenosis. Echo EF 60-65%. EKG x 2 in ED not concerning for ischemia. Lipid panel WNL. ASCVD risk >10%, added lipitor 40mg . 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 Pt had one episode of severe headache 10/5, 10/6 CT head which was negative. Cardiology consulted due to complaints of angina symptoms as above x 54mo. Myoview low risk and felt that she was safe for discharge with ongoing follow up with them as an outpatient. EP consulted for possible loop recorder to rule out afib as cause of stroke,  and they felt she was not a candidate for this at this time.   OVERALL: - Pt. Had a negative workup for stroke.  - Neuro IR found the aneurysm of the Right MCA petrous / cavernous 47mmx4.1mm in addition to 70-75% stenosis of the right proximal MCA. Want to follow up with her in their clinic.  - Cardiology evaluated her and found low risk Myovue. Will follow up with her as an outpatient. No Loope Recorder at this time.   Safe for discharge to home with outpatient follow up.   Issues for Follow Up:  1. Stroke clinic in 2 months with Dr. Leonie Man.  2. Continue outpatient followup with Endocrinology for oral diabetes medicines. 3. Continue monitoring headaches and anxiety with PCP. 4. Continue to follow with Dr. Estanislado Pandy for medical management of 70-75% stenosis in right MCA and follow up of 65mm x 4.71mm RT ICA cavernous aneursym. Appt: 11/29/14 1pm.   Significant Procedures: Echo EF 60-65%. MRA found  16mm R cavernous ICA aneurysm. Cerebral angiogram 38mm x 4.36mm RT ICA cavernous aneursym and 70-75% stenosis of RT MCA prox.  Significant Labs and Imaging:   Recent Labs Lab 11/11/14 1558 11/15/14 0527  WBC 6.5 6.4  HGB 14.3 13.9  HCT 45.5 45.0  PLT 266 231    Recent Labs Lab 11/11/14 1558 11/14/14 0710 11/15/14 0527  NA 138 137 140  K 4.3 3.9 4.5  CL 105 102 99*  CO2 25 28 31   GLUCOSE 89 156* 150*  BUN 16 16 15   CREATININE 0.70 0.64 0.70  CALCIUM 8.8* 8.9 9.4  ALKPHOS 71  --   --   AST 20  --   --   ALT 13*  --   --  ALBUMIN 3.7  --   --     CT Head NAICA on admission, repeat for severe HA 10/6 NAICA, MRI head normal, myoview low risk  Results/Tests Pending at Time of Discharge: Final read of cerebral angiogram  Discharge Medications:    Medication List    STOP taking these medications        aspirin 325 MG tablet  Replaced by:  aspirin 81 MG EC tablet      TAKE these medications        albuterol (2.5 MG/3ML) 0.083% nebulizer solution  Commonly known as:   PROVENTIL  Take 3 mLs (2.5 mg total) by nebulization every 6 (six) hours as needed for wheezing or shortness of breath.     albuterol 108 (90 BASE) MCG/ACT inhaler  Commonly known as:  PROVENTIL HFA;VENTOLIN HFA  Inhale 2 puffs into the lungs every 6 (six) hours as needed. For shortness of breath.     aspirin 81 MG EC tablet  Take 1 tablet (81 mg total) by mouth daily.     atorvastatin 40 MG tablet  Commonly known as:  LIPITOR  Take 1 tablet (40 mg total) by mouth every morning.     cetirizine 10 MG tablet  Commonly known as:  ZYRTEC  Take 10 mg by mouth daily.     docusate sodium 100 MG capsule  Commonly known as:  COLACE  Take 100 mg by mouth daily.     empagliflozin 10 MG Tabs tablet  Commonly known as:  JARDIANCE  Take 10 mg by mouth daily.     furosemide 20 MG tablet  Commonly known as:  LASIX  Take 1 tablet (20 mg total) by mouth daily.     gabapentin 300 MG capsule  Commonly known as:  NEURONTIN  Take 1 capsule (300 mg total) by mouth 2 (two) times daily.     HYDROcodone-acetaminophen 5-325 MG tablet  Commonly known as:  NORCO/VICODIN  Take 1 tablet by mouth every 6 (six) hours as needed for moderate pain.     Insulin Pen Needle 30G X 5 MM Misc  1 each by Does not apply route daily.     lisinopril 20 MG tablet  Commonly known as:  PRINIVIL,ZESTRIL  TAKE 1 TABLET BY MOUTH DAILY     metFORMIN 500 MG tablet  Commonly known as:  GLUCOPHAGE  Take 2 tablets (1,000 mg total) by mouth 2 (two) times daily with a meal. PATIENT NEEDS OFFICE VISIT FOR ADDITIONAL REFILLS     metoprolol tartrate 25 MG tablet  Commonly known as:  LOPRESSOR  Take 1 tablet (25 mg total) by mouth 2 (two) times daily.     mometasone 220 MCG/INH inhaler  Commonly known as:  ASMANEX  Use 1 puff daily followed by rinsing your mouth out with water.     omeprazole 20 MG capsule  Commonly known as:  PRILOSEC  Take 20 mg by mouth.     repaglinide 2 MG tablet  Commonly known as:  PRANDIN   Take 1 tablet (2 mg total) by mouth 3 (three) times daily before meals.     sertraline 50 MG tablet  Commonly known as:  ZOLOFT  Take 1 tablet (50 mg total) by mouth at bedtime.        Discharge Instructions: Please refer to Patient Instructions section of EMR for full details.  Patient was counseled important signs and symptoms that should prompt return to medical care, changes in medications, dietary instructions, activity restrictions, and  follow up appointments.   Follow-Up Appointments: Follow-up Information    Follow up with SETHI,PRAMOD, MD. Schedule an appointment as soon as possible for a visit in 2 months.   Specialties:  Neurology, Radiology   Why:  Call to make an appt for stroke clinic follow up   Contact information:   [place] [address] [phone]       Follow up with Rob Hickman, MD. Go on 11/29/2014.   Specialty:  Interventional Radiology   Why:  1pm followup appt; come to main entrance of [facility] and ask for radiology   Contact information:   [address] [place] [address] [phone]       Follow up with [name], MD. Go on 11/20/2014.   Specialty:  Family Medicine   Why:  8am hospital follow up ([place])    Contact information:   [place] [address] [phone]       Aquilla Hacker, MD 11/15/2014, 8:29 PM Medical Student (pager [phone]), [place]   I agree with the above evaluation, assessment, and plan. Any correctional changes can be noted in [facility].   The discharge summary above was formulated with the help of the medical student on the service. I helped to formulate greater than 50% of the discharge summary. For my own assessment, plan, and physical exam see the daily progress note on day of discharge.   Aquilla Hacker, MD Family Medicine Resident - PGY 1

## 2014-11-12 NOTE — Consult Note (Signed)
CARDIOLOGY CONSULT NOTE   Patient ID: Sherry [name] MRN: [id number], DOB/AGE: [birth year]   Admit date: 11/11/2014 Date of Consult: 11/12/2014   Primary Physician: [name], MD Primary Cardiologist: [name]  Pt. Profile   64 year old African-American female with past medical history of hypertension, hyperlipidemia, DM, goiter, and mitral valve prolapse presented with TIA symptoms, also noted to have intermittent chest discomfort for the past month.  Problem List  Past Medical History  Diagnosis Date  . Diabetes mellitus   . Hypertension   . GERD (gastroesophageal reflux disease)   . Dyslipidemia   . Mitral valve prolapse occasional palpitations w/ chest discomfort  . DJD (degenerative joint disease)   . Meniere's disease   . Rotator cuff tear, left   . History of CHF (congestive heart failure) 2010-  RESOLVED  . H/O hiatal hernia   . Complication of anesthesia HYPOTENSION INTRAOP W/ HYSTERECTOMY IN 1987--  DID OK W/ TOTAL KNEE IN 2008  . OA (osteoarthritis)   . History of peptic ulcer YRS AGO    Past Surgical History  Procedure Laterality Date  . Total knee arthroplasty  09-12-2006  [facility]    RIGHT KNEE  . Vaginal hysterectomy  1987  . Bunionectomy  2007    LEFT FOOT  . Tympanoplasty  1990;   1980  . Left shoulder surgery  1997    ROTATOR CUFF REPAIR  . Tubal ligation  1976  . Shoulder arthroscopy  09/20/2011    Procedure: ARTHROSCOPY SHOULDER;  Surgeon: Johnn Hai, MD;  Location: [facility];  Service: Orthopedics;  Laterality: Left;  SAD AND MINI OPEN ROTATOR CUFF REPAIR       Allergies  Allergies  Allergen Reactions  . Influenza Vaccines Anaphylaxis  . Codeine Itching  . Demerol Other (See Comments)    SEIZURE  . Nsaids Other (See Comments)    NAPROXEN, IBUPROFEN, SKELAXIN---  CAUSES PALPITATIONS  . Prednisone     Caused her glucose to go up to 500 and went to ED  . Tramadol     Contraindicated with Victoza      HPI   The patient is a pleasant 64 year old African-American female with past medical history of hypertension, hyperlipidemia, DM, goiter, and mitral valve prolapse. According to the patient, she had a cardiac catheterization somewhere between 2000 and 2006 and was told she did not have significant coronary artery disease. She was previously followed by Dr. Angelena Sole. She had a Myoview several years ago which reportedly was negative. Unfortunately I am unable to locate both the previous Myoview for cardiac catheterization report. She has not seen another cardiologist since her previous one moved to [place] 3 years ago. In 2013, she had a TIA episode, however she has been doing well since.  For the past several months, she has been having intermittent left-sided chest pain radiating to the shoulder blade, it often occur with exertion and resolve at rest. The last episode was last Friday while she was sweeping the sidewalk, she had some diaphoresis and shortness of breath. It later resolved with resting. She denies any significant lower extremity edema, orthopnea or paroxysmal nocturnal dyspnea.   She presented to [facility] on 11/11/2014 after waking up having left facial droop and left eyelid droop. She is unable to tell me whether or not she had left upper or lower extremity weakness, but does state she has chronic left knee weakness and tingling sensation in the foot due to diabetic neuropathy. Her CT  of the head is negative. 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 Echocardiogram showed EF 60-65%, no regional wall motion abnormality. No cardiac source of emboli was identified. During that admission, patient admits to have intermittent chest discomfort, therefore cardiology consult was prompted.   Inpatient Medications  . aspirin  325 mg Oral Daily  . budesonide  0.5 mg Nebulization BID  . docusate sodium  100 mg Oral  Daily  . furosemide  20 mg Oral Daily  . gabapentin  300 mg Oral BID  . heparin  5,000 Units Subcutaneous 3 times per day  . insulin aspart  0-5 Units Subcutaneous QHS  . insulin aspart  0-9 Units Subcutaneous TID WC  . lisinopril  20 mg Oral Daily  . metoprolol tartrate  25 mg Oral BID  . pantoprazole  40 mg Oral Daily  . sertraline  25 mg Oral QHS    Family History Family History  Problem Relation Age of Onset  . Heart disease Mother   . Diabetes Mother   . Kidney disease Mother   . Thyroid disease Mother   . Thyroid disease Brother   . Thyroid disease Sister   . Thyroid disease Paternal Grandmother      Social History Social History   Social History  . Marital Status: Married    Spouse Name: N/A  . Number of Children: N/A  . Years of Education: N/A   Occupational History  . Not on file.   Social History Main Topics  . Smoking status: Never Smoker   . Smokeless tobacco: Never Used  . Alcohol Use: No  . Drug Use: No  . Sexual Activity: Not on file   Other Topics Concern  . Not on file   Social History Narrative     Review of Systems  General:  No chills, fever, night sweats or weight changes.  Cardiovascular:  No edema, orthopnea, palpitations, paroxysmal nocturnal dyspnea. +chest pain, dyspnea on exertion Dermatological: No rash, lesions/masses Respiratory: No cough, dyspnea Urologic: No hematuria, dysuria Abdominal:   No nausea, vomiting, diarrhea, bright red blood per rectum, melena, or hematemesis Neurologic:  No visual changes, wkns, changes in mental status. All other systems reviewed and are otherwise negative except as noted above.  Physical Exam  Blood pressure 151/61, pulse 69, temperature 98.8 F (37.1 C), temperature source Oral, resp. rate 18, height 5\' 2"  (7.253 m), weight 201 lb 1.6 oz (91.218 kg), SpO2 97 %.  General: Pleasant, NAD Psych: Normal affect. Neuro: Alert and oriented X 3. Moves all extremities spontaneously. HEENT:  Normal  Neck: Supple without bruits or JVD. Lungs:  Resp regular and unlabored, CTA. Heart: RRR no s3, s4, or murmurs. Abdomen: Soft, non-tender, non-distended, BS + x 4.  Extremities: No clubbing, cyanosis or edema. DP/PT/Radials 2+ and equal bilaterally.  Labs  No results for input(s): CKTOTAL, CKMB, TROPONINI in the last 72 hours. Lab Results  Component Value Date   WBC 6.5 11/11/2014   HGB 14.3 11/11/2014   HCT 45.5 11/11/2014   MCV 89.2 11/11/2014   PLT 266 11/11/2014    Recent Labs Lab 11/11/14 1558  NA 138  K 4.3  CL 105  CO2 25  BUN 16  CREATININE 0.70  CALCIUM 8.8*  PROT 6.4*  BILITOT 0.4  ALKPHOS 71  ALT 13*  AST 20  GLUCOSE 89   Lab Results  Component Value Date   CHOL 150 11/12/2014  HDL 59 11/12/2014   LDLCALC 78 11/12/2014   TRIG 66 11/12/2014   Lab Results  Component Value Date   DDIMER * 11/25/2007    0.99        AT THE INHOUSE ESTABLISHED CUTOFF VALUE OF 0.48 ug/mL FEU, THIS ASSAY HAS BEEN DOCUMENTED IN THE LITERATURE TO HAVE    Radiology/Studies  Ct Head Wo Contrast  11/11/2014   CLINICAL DATA:  Headaches with left facial and mouth tingling, drooling and left eyelid weakness for 1 week. Initial encounter.  EXAM: CT HEAD WITHOUT CONTRAST  TECHNIQUE: Contiguous axial images were obtained from the base of the skull through the vertex without intravenous contrast.  COMPARISON:  Head CT 11/19/2011.  FINDINGS: There is no evidence of acute intracranial hemorrhage, mass lesion, brain edema or extra-axial fluid collection. The ventricles and subarachnoid spaces are appropriately sized for age. There is no CT evidence of acute cortical infarction.  The visualized paranasal sinuses, mastoid air cells and middle ears are clear. The calvarium is intact. Calvarial hyperostosis and prominent falcine calcification again noted.  IMPRESSION: Stable head CT. No acute intracranial findings or explanation for the patient's symptoms.   Electronically Signed   By:  Richardean Sale M.D.   On: 11/11/2014 17:17   Mr Brain Wo Contrast  11/11/2014   CLINICAL DATA:  Intermittent tingling and left-sided facial droop. Some associated blurred vision. Symptoms began 14 hr ago and have been waxing and waning.  EXAM: MRI HEAD WITHOUT CONTRAST  TECHNIQUE: Multiplanar, multiecho pulse sequences of the brain and surrounding structures were obtained without intravenous contrast.  COMPARISON:  Head CT earlier same day.  FINDINGS: The brain has a normal appearance on all pulse sequences without evidence of malformation, atrophy, old or acute infarction, mass lesion, hemorrhage, hydrocephalus or extra-axial collection. No pituitary mass. No fluid in the sinuses, middle ears or mastoids. No skull or skullbase lesion. There is flow in the major vessels at the base of the brain. Major venous sinuses show flow.  IMPRESSION: Normal examination.   Electronically Signed   By: Nelson Chimes M.D.   On: 11/11/2014 21:04   Mr Jodene Nam Head/brain Wo Cm  11/12/2014   CLINICAL DATA:  Left facial droop.  TIA.  EXAM: MRA HEAD WITHOUT CONTRAST  TECHNIQUE: Angiographic images of the Circle of Willis were obtained using MRA technique without intravenous contrast.  COMPARISON:  None.  FINDINGS: Visualized distal vertebral arteries are patent and equal in size without stenosis. PICA, AICA, and SCA origins are patent. Basilar artery is patent without stenosis. There is a fetal type origin of the left PCA. PCAs are patent with mild P2 and more distal branch vessel irregularity and narrowing bilaterally but no high-grade proximal stenosis.  Internal carotid arteries are patent from skullbase to carotid termini without stenosis. There is a 5 x 2 mm posteroinferiorly directed outpouching from the anterior right cavernous carotid artery consistent with aneurysm. The left A1 segment is absent. Left A2 is supplied by the anterior communicating artery. ACAs and MCAs are patent without evidence of major branch vessel  occlusion or significant proximal stenosis. There is mild MCA branch vessel attenuation bilaterally.  IMPRESSION: 1. Mild branch vessel atherosclerotic changes without evidence of medium or large vessel occlusion or significant proximal stenosis. 2. 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   Electronically Signed   By: Logan Bores M.D.   On: 11/12/2014 11:07    ECG  Normal sinus rhythm with heart rate 60s, no significant ST-T  wave changes.  ASSESSMENT AND PLAN  1. TIA  - Negative CT of brain and MRI for acute process  - Will defer loop recorder and TEE need to stroke team.  - although she said she had palpitation during rehab today, no sign of afib noted on telemetry  2. Intermittent CP  - Exertional in nature, however reportedly had a negative cath 2000-2006  - given result TIA, will obtain lexiscan myoview   3. Hypertension 4. Hyperlipidemia 5. DM 6. Goiter 7. mitral valve prolapse  Signed, Almyra Deforest, PA-C 11/12/2014, 1:36 PM  Previously seen by Dr Verlon Setting with [facility].  Negative w/u but distant.  Atypical pain with risk factors  CT with only mild coronary calcium  Will order lexiscan myovue for pain.  In regard to TIA per neuro.  Carotids are pending.  Consider loop recorder Can call EP directly for this  If TTE ok not sure need TEE.  Continue beta blocker and ACE for BP  Jenkins Rouge

## 2014-11-12 NOTE — Progress Notes (Signed)
Family Medicine Teaching Service Daily Progress Note Intern Pager: [phone]  Patient name: Sherry [name] Medical record number: [id number] Date of birth: 23-May-1950 Age: 64 y.o. Gender: female  Primary Care Provider: [name], MD Consultants: neurology, HF  Code Status: FULL  Pt Overview and Major Events to Date:  L sided facial droop and LUE weakness has resolved this am.   Assessment and Plan: TIA: Pt had a similar TIA in 2013, which also began with L sided facial droop. All symptoms resolved this am. However, pt reports new chest tightness intermittently with exertion over the last month. Concern that this TIA could be secondary to new afib. No history of afib. Doesn't recall her most recent cardiology visit. Reports taking her ASA 325mg  "religiously." CT head NAICA, MRI normal. MRA, carotid dopplers, echo pending. EKG x 2 in ED not concerning for ischemia. Lipid panel WNL.  - consult cardiology as pt does not have cardiology appt in [facility] or care everywhere - consult neurology - f/u MRA, carotid dopplers, echo - PT/OT/SLP   DMII: SSI while inpatient.   HTN: continue home lisinopril 20mg  and metoprolol 25mg  BID.   Asthma: stable per patient. She reports using proventil inhaler intermittently based on seasonal changes rather than daily.  - albuterol prn wheezing.   Dependent edema: continue home lasix 20mg  daily.  Peripheral neuropathy: continue home gabapentin 300mg .   GERD: continue home PPI (protonix 40mg ).   Depression: continue home zoloft 25mg .  FEN/GI: PPI PPx: heparin 5,000 units  Disposition: transfer care to [place].   Subjective:  Pt reports that L facial droop, LUE weakness, LLE tingling began yesterday. She had a TIA in 2013 that also began with L facial droop. However, for the past month or so she has been having chest tightness and heaviness with exertion, along with SOB, that resolves when she rests. She feels that she has to do "puffing" breathing  during these episodes. An uncomfortable feeling under her L scapula occurs with the chest tightness. She notes extensive family history of MIs (maternal grandmother, mother, two younger sisters), including a younger sister with a stent placed last year in her 58s. She has been taking OTC acid relievers to try to determine whether the chest tightness was GERD, but these did not help. She denies any abdominal pain or chest pain currently. She was able to ambulate to the bathroom this morning.  Objective: Temp:  [97.5 F (36.4 C)-98.7 F (37.1 C)] 98.2 F (36.8 C) (10/04 0800) Pulse Rate:  [57-78] 70 (10/04 0800) Resp:  [13-20] 16 (10/04 0800) BP: (102-160)/(47-92) 160/92 mmHg (10/04 0800) SpO2:  [94 %-100 %] 100 % (10/04 0800) Weight:  [201 lb 1.6 oz (91.218 kg)-202 lb 3.2 oz (91.717 kg)] 201 lb 1.6 oz (91.218 kg) (10/03 2213) Physical Exam: General: pt resting comfortably in chair Cardiovascular: RRR, no m/r/g Respiratory: CTAB, no crackles, rhonchi, or wheezes Abdomen: NTND, BS+ Extremities: no LE edema Neuro: CN III-VII grossly intact, strength 5/5 in all extremities, mildly decreased sensation over LUE and LLE.   Laboratory:  Recent Labs Lab 11/11/14 1558  WBC 6.5  HGB 14.3  HCT 45.5  PLT 266    Recent Labs Lab 11/11/14 1558  NA 138  K 4.3  CL 105  CO2 25  BUN 16  CREATININE 0.70  CALCIUM 8.8*  PROT 6.4*  BILITOT 0.4  ALKPHOS 71  ALT 13*  AST 20  GLUCOSE 89   i-stat trop neg x1 Lipid panel WNL  Imaging/Diagnostic Tests: CT head NAICA MRI  normal examination MRA pending  Sela Hilding, Med Student 11/12/2014, 8:54 AM Medical student pager [phone], [place] Intern pager: [phone], text pages welcome  I agree with the above evaluation, assessment, and plan. Any correctional changes can be noted in [facility].   Aquilla Hacker, MD Family Medicine Resident - PGY 2  O:  Filed Vitals:   11/12/14 1406  BP: 134/59  Pulse: 62   Temp: 98.6 F (37 C)  Resp: 20   Gen: NAD, resting comfortably in the chair.  HEENT: NCAT, PERRLA, EOMI.  Neck: Goiter CV: RRR, No MGR, normal S1/S2, 2+distal pulses. Resp: CTA Bilaterally, appropriate rate, unlabored.  Abd: S, NT, ND, +BS Ext: WWP, 2+ distal pulses, MAEW.  Skin: No rashes, no lesions Neuro: AAOx3, 5/5 motor in all extremities, Some slight difference in light touch from left to right in both of her lower extremities with deficit more pronounced on the left, CN II-XII tested and in tact, cerebellar in tact, No dysmetria. Psych: Appropriate mood / affect.   A/P: Essentially 64 y/o F here with TIA symptoms. Hx of TIA previously, also DMII, HTN, Peripheral Neuropathy, Hyperthyroidism, Goiter s/p radioiodine ablation.   # TIA - pt. With numbness / tingling / left upper extremity weakness that is resolved. CT head / MRI negative so far.  - MRI / MRA - Echo, Carotid Dopplers - Neuro consult - Continue ASA for now - Add Lipitor - PT/OT/SLP - Risk stratification labs ordered.   # Chest Pain / Angina Symptoms - pt. With significant family history. Risk factors including HTN, DMII, ASCVD 10 year risk 13%, Has been avoiding getting this evaluated. No active ACS symptoms. Chest discomfort / nausea/ SOB intermittently with activity. Hx of CHF unclear etiology. Myoview in 2010 that was normal. Lost follow up with Cards due to her cardiologist moving away. Negative troponin here. EKG with no acute changes.  - Statin / ASA as above.  - Continue tele for now as there is a chance that her symptoms may be PAF?  - consultation to cardiology for evaluation. She will certainly need follow up with them, and may benefit from inpatient vs. Outpatient myoview / stress test.  - risk factor reduction.   # Hyperthyroidism - TSH 0.8 at last check. Has undergone radioiodine ablation already. Followed by endocrine.  - Nothing to do at this time.   All other problems per Note Above.   Paula Compton, MD Family Medicine - PGY 2

## 2014-11-12 NOTE — Progress Notes (Signed)
  Echocardiogram 2D Echocardiogram has been performed.  Bobbye Charleston 11/12/2014, 11:40 AM

## 2014-11-12 NOTE — Care Management Note (Signed)
Case Management Note  Patient Details  Name: Janille Draughon MRN: [id number] Date of Birth: [birth year]  Subjective/Objective:                    Action/Plan: Patient admitted with TIA. OT recommending [facility] and awaiting PT recommendations. Pt is from home with her spouse. CM will continue to follow for discharge needs.   Expected Discharge Date:                  Expected Discharge Plan:  Home/Self Care  In-House Referral:     Discharge planning Services     Post Acute Care Choice:    Choice offered to:     DME Arranged:    DME Agency:     HH Arranged:    HH Agency:     Status of Service:  In process, will continue to follow  Medicare Important Message Given:    Date Medicare IM Given:    Medicare IM give by:    Date Additional Medicare IM Given:    Additional Medicare Important Message give by:     If discussed at [place] of Stay Meetings, dates discussed:    Additional Comments:  Pollie Friar, RN 11/12/2014, 1:52 PM

## 2014-11-12 NOTE — Therapy (Signed)
SLP Cancellation Note  Unable to complete SLP evaluation/treatment secondary to pt currently off unit for MRI. Will continue efforts.   Celia B. Quentin Ore [facility], [place] [phone]

## 2014-11-12 NOTE — Consult Note (Signed)
Referring Physician: Mcintyre    Chief Complaint: TIA ?  HPI:                                                                                                                                         Sherry [name] is an 64 y.o. female with HTN, DM, dyslipidemia, MVP, who states she awoke on Saturday and felt she had a facial droop, left eye ptosis and decreased sensation on her left face.  She also noted a stabbing sensation in her left shoulder and under the arm pit.  In addition she had some tearing of her left eye and slight drooling of her left aspect of her mouth.  This improved by 12pm but did not fully go away.  The next day she noted her face continued to be tingling and came to ED.  Per chart her symptoms had resolved but today she states her sensation symptoms had not resolved. MRI was obtained today and shows no acute infarct.   Date last known well: Date: 11/09/2014 Time last known well: Unable to determine tPA Given: No: out of window    Past Medical History  Diagnosis Date  . Diabetes mellitus   . Hypertension   . GERD (gastroesophageal reflux disease)   . Dyslipidemia   . Mitral valve prolapse occasional palpitations w/ chest discomfort  . DJD (degenerative joint disease)   . Meniere's disease   . Rotator cuff tear, left   . History of CHF (congestive heart failure) 2010-  RESOLVED  . H/O hiatal hernia   . Complication of anesthesia HYPOTENSION INTRAOP W/ HYSTERECTOMY IN 1987--  DID OK W/ TOTAL KNEE IN 2008  . OA (osteoarthritis)   . History of peptic ulcer YRS AGO    Past Surgical History  Procedure Laterality Date  . Total knee arthroplasty  09-12-2006  [facility]    RIGHT KNEE  . Vaginal hysterectomy  1987  . Bunionectomy  2007    LEFT FOOT  . Tympanoplasty  1990;   1980  . Left shoulder surgery  1997    ROTATOR CUFF REPAIR  . Tubal ligation  1976  . Shoulder arthroscopy  09/20/2011    Procedure: ARTHROSCOPY SHOULDER;  Surgeon: Johnn Hai, MD;  Location:  [facility];  Service: Orthopedics;  Laterality: Left;  SAD AND MINI OPEN ROTATOR CUFF REPAIR      Family History  Problem Relation Age of Onset  . Heart disease Mother   . Diabetes Mother   . Kidney disease Mother   . Thyroid disease Mother   . Thyroid disease Brother   . Thyroid disease Sister   . Thyroid disease Paternal Grandmother    Social History:  reports that she has never smoked. She has never used smokeless tobacco. She reports that she does not drink alcohol or use illicit drugs.  Allergies:  Allergies  Allergen Reactions  .  Influenza Vaccines Anaphylaxis  . Codeine Itching  . Demerol Other (See Comments)    SEIZURE  . Nsaids Other (See Comments)    NAPROXEN, IBUPROFEN, SKELAXIN---  CAUSES PALPITATIONS  . Prednisone     Caused her glucose to go up to 500 and went to ED  . Tramadol     Contraindicated with Victoza     Medications:                                                                                                                           Prior to Admission:  Prescriptions prior to admission  Medication Sig Dispense Refill Last Dose  . albuterol (PROVENTIL HFA;VENTOLIN HFA) 108 (90 BASE) MCG/ACT inhaler Inhale 2 puffs into the lungs every 6 (six) hours as needed. For shortness of breath.   Past Month at Unknown time  . albuterol (PROVENTIL) (2.5 MG/3ML) 0.083% nebulizer solution Take 3 mLs (2.5 mg total) by nebulization every 6 (six) hours as needed for wheezing or shortness of breath. 150 mL 1 Past Month at Unknown time  . aspirin 325 MG tablet Take 325 mg by mouth daily.   11/11/2014 at Unknown time  . cetirizine (ZYRTEC) 10 MG tablet Take 10 mg by mouth daily.   11/11/2014 at Unknown time  . docusate sodium (COLACE) 100 MG capsule Take 100 mg by mouth daily.   11/11/2014 at Unknown time  . empagliflozin (JARDIANCE) 10 MG TABS tablet Take 10 mg by mouth daily. 30 tablet 11 11/11/2014 at Unknown time  . furosemide (LASIX) 20 MG tablet Take  1 tablet (20 mg total) by mouth daily. 30 tablet 3 11/11/2014 at Unknown time  . gabapentin (NEURONTIN) 300 MG capsule Take 1 capsule (300 mg total) by mouth 2 (two) times daily. 60 capsule 5 11/11/2014 at Unknown time  . HYDROcodone-acetaminophen (NORCO/VICODIN) 5-325 MG per tablet Take 1 tablet by mouth every 6 (six) hours as needed for moderate pain.   11/10/2014 at Unknown time  . lisinopril (PRINIVIL,ZESTRIL) 20 MG tablet TAKE 1 TABLET BY MOUTH DAILY 30 tablet 4 11/11/2014 at Unknown time  . metFORMIN (GLUCOPHAGE) 500 MG tablet Take 2 tablets (1,000 mg total) by mouth 2 (two) times daily with a meal. PATIENT NEEDS OFFICE VISIT FOR ADDITIONAL REFILLS (Patient taking differently: Take 1,000 mg by mouth 2 (two) times daily with a meal. ) 120 tablet 0 11/11/2014 at Unknown time  . metoprolol tartrate (LOPRESSOR) 25 MG tablet Take 1 tablet (25 mg total) by mouth 2 (two) times daily. 60 tablet 5 11/11/2014 at 0800  . mometasone (ASMANEX) 220 MCG/INH inhaler Use 1 puff daily followed by rinsing your mouth out with water. 1 Inhaler 12 11/11/2014 at Unknown time  . omeprazole (PRILOSEC) 20 MG capsule Take 20 mg by mouth.   11/11/2014 at Unknown time  . repaglinide (PRANDIN) 2 MG tablet Take 1 tablet (2 mg total) by mouth 3 (three) times daily before meals. 90 tablet 11 11/11/2014 at Unknown time  .  sertraline (ZOLOFT) 25 MG tablet TAKE 1 TABLET BY MOUTH AT BEDTIME 90 tablet 1 11/10/2014 at Unknown time  . Insulin Pen Needle 30G X 5 MM MISC 1 each by Does not apply route daily. 200 each 3 Taking   Scheduled: . aspirin  325 mg Oral Daily  . budesonide  0.5 mg Nebulization BID  . docusate sodium  100 mg Oral Daily  . furosemide  20 mg Oral Daily  . gabapentin  300 mg Oral BID  . heparin  5,000 Units Subcutaneous 3 times per day  . insulin aspart  0-5 Units Subcutaneous QHS  . insulin aspart  0-9 Units Subcutaneous TID WC  . lisinopril  20 mg Oral Daily  . metoprolol tartrate  25 mg Oral BID  . pantoprazole  40  mg Oral Daily  . sertraline  25 mg Oral QHS    ROS:                                                                                                                                       History obtained from the patient  General ROS: negative for - chills, fatigue, fever, night sweats, weight gain or weight loss Psychological ROS: negative for - behavioral disorder, hallucinations, memory difficulties, mood swings or suicidal ideation Ophthalmic ROS: negative for - blurry vision, double vision, eye pain or loss of vision ENT ROS: negative for - epistaxis, nasal discharge, oral lesions, sore throat, tinnitus or vertigo Allergy and Immunology ROS: negative for - hives or itchy/watery eyes Hematological and Lymphatic ROS: negative for - bleeding problems, bruising or swollen lymph nodes Endocrine ROS: negative for - galactorrhea, hair pattern changes, polydipsia/polyuria or temperature intolerance Respiratory ROS: negative for - cough, hemoptysis, shortness of breath or wheezing Cardiovascular ROS: negative for - chest pain, dyspnea on exertion, edema or irregular heartbeat Gastrointestinal ROS: negative for - abdominal pain, diarrhea, hematemesis, nausea/vomiting or stool incontinence Genito-Urinary ROS: negative for - dysuria, hematuria, incontinence or urinary frequency/urgency Musculoskeletal ROS: negative for - joint swelling or muscular weakness Neurological ROS: as noted in HPI Dermatological ROS: negative for rash and skin lesion changes  Physical Examination:                                                                                                      Blood pressure 125/73, pulse 67, temperature 98.8 F (37.1 C), temperature source Oral, resp. rate 16, height 5\' 2"  (1.575 m), weight 91.218 kg (201 lb 1.6 oz), SpO2 99 %.  HEENT-  Normocephalic, no lesions, without obvious abnormality.  Normal external eye and conjunctiva.  Normal TM's bilaterally.  Normal auditory canals and  external ears. Normal external nose, mucus membranes and septum.  Normal pharynx. Cardiovascular- S1, S2 normal, pulses palpable throughout   Lungs- chest clear, no wheezing, rales, normal symmetric air entry Abdomen- normal findings: bowel sounds normal Extremities- no edema Lymph-no adenopathy palpable Musculoskeletal-no joint tenderness, deformity or swelling Skin-warm and dry, no hyperpigmentation, vitiligo, or suspicious lesions  Neurological Examination Mental Status: Alert, oriented, thought content appropriate.  Speech fluent without evidence of aphasia.  Able to follow 3 step commands without difficulty. Cranial Nerves: II: Discs flat bilaterally; Visual fields grossly normal, pupils equal, round, reactive to light and accommodation III,IV, VI: ptosis not present, extra-ocular motions intact bilaterally V,VII: smile symmetric, facial light touch sensation decreased on the left side and splits midline to both LT and tuning fork.  VIII: hearing normal bilaterally IX,X: uvula rises symmetrically XI: bilateral shoulder shrug--has great strength with turning head to the right but gives poor effort turning head to the left.  XII: midline tongue extension Motor: Right : Upper extremity   5/5    Left:     Upper extremity   5/5  Lower extremity   5/5     Lower extremity   5/5 Tone and bulk:normal tone throughout; no atrophy noted Sensory: Pinprick and light touch intact throughout, bilaterally Deep Tendon Reflexes: 2+ and symmetric throughout Plantars: Right: downgoing   Left: downgoing Cerebellar: normal finger-to-nose,and normal heel-to-shin test Gait: not tested.        Lab Results: Basic Metabolic Panel:  Recent Labs Lab 11/11/14 1558  NA 138  K 4.3  CL 105  CO2 25  GLUCOSE 89  BUN 16  CREATININE 0.70  CALCIUM 8.8*    Liver Function Tests:  Recent Labs Lab 11/11/14 1558  AST 20  ALT 13*  ALKPHOS 71  BILITOT 0.4  PROT 6.4*  ALBUMIN 3.7   No results  for input(s): LIPASE, AMYLASE in the last 168 hours. No results for input(s): AMMONIA in the last 168 hours.  CBC:  Recent Labs Lab 11/11/14 1558  WBC 6.5  NEUTROABS 2.8  HGB 14.3  HCT 45.5  MCV 89.2  PLT 266    Cardiac Enzymes: No results for input(s): CKTOTAL, CKMB, CKMBINDEX, TROPONINI in the last 168 hours.  Lipid Panel:  Recent Labs Lab 11/12/14 0700  CHOL 150  TRIG 66  HDL 59  CHOLHDL 2.5  VLDL 13  LDLCALC 78    CBG:  Recent Labs Lab 11/12/14 0040 11/12/14 0640  GLUCAP 181* 130*    Microbiology: Results for orders placed or performed during the hospital encounter of 11/01/09  Urine culture     Status: None   Collection Time: 11/01/09  6:40 PM  Result Value Ref Range Status   Specimen Description URINE, CLEAN CATCH  Final   Special Requests NONE  Final   Culture  Setup Time [id number]  Final   Colony Count 7,000 COLONIES/ML  Final   Culture INSIGNIFICANT GROWTH  Final   Report Status 11/03/2009 FINAL  Final    Coagulation Studies:  Recent Labs  11/11/14 1558  LABPROT 13.7  INR 1.03    Imaging: Ct Head Wo Contrast  11/11/2014   CLINICAL DATA:  Headaches with left facial and mouth tingling, drooling and left eyelid weakness for 1 week. Initial encounter.  EXAM: CT HEAD WITHOUT CONTRAST  TECHNIQUE: Contiguous axial images were obtained from the  base of the skull through the vertex without intravenous contrast.  COMPARISON:  Head CT 11/19/2011.  FINDINGS: There is no evidence of acute intracranial hemorrhage, mass lesion, brain edema or extra-axial fluid collection. The ventricles and subarachnoid spaces are appropriately sized for age. There is no CT evidence of acute cortical infarction.  The visualized paranasal sinuses, mastoid air cells and middle ears are clear. The calvarium is intact. Calvarial hyperostosis and prominent falcine calcification again noted.  IMPRESSION: Stable head CT. No acute intracranial findings or explanation for the  patient's symptoms.   Electronically Signed   By: Richardean Sale M.D.   On: 11/11/2014 17:17   Mr Brain Wo Contrast  11/11/2014   CLINICAL DATA:  Intermittent tingling and left-sided facial droop. Some associated blurred vision. Symptoms began 14 hr ago and have been waxing and waning.  EXAM: MRI HEAD WITHOUT CONTRAST  TECHNIQUE: Multiplanar, multiecho pulse sequences of the brain and surrounding structures were obtained without intravenous contrast.  COMPARISON:  Head CT earlier same day.  FINDINGS: The brain has a normal appearance on all pulse sequences without evidence of malformation, atrophy, old or acute infarction, mass lesion, hemorrhage, hydrocephalus or extra-axial collection. No pituitary mass. No fluid in the sinuses, middle ears or mastoids. No skull or skullbase lesion. There is flow in the major vessels at the base of the brain. Major venous sinuses show flow.  IMPRESSION: Normal examination.   Electronically Signed   By: Nelson Chimes M.D.   On: 11/11/2014 21:04    Assessment and plan discussed with with attending physician and they are in agreement.    Etta Quill PA-C [facility] [phone]  11/12/2014, 10:18 AM   Assessment: 64 y.o. female with left sided facial droop and decreased sensation which has been on and off since Saturday.  The decreased facial sensation has never fully resolved. MRI brain shows no acute stroke. Exam shows multiple functional aspects.  Given the intermittent facial droop, would continue with stroke work up to evaluate.  Stroke Risk Factors - diabetes mellitus, hyperlipidemia and hypertension   Recommend: 1) Continue stroke work up 2) Continue ASA  3) Stroke team to follow  Patient seen and examined together with physician assistant and I concur with the assessment and plan.  Dorian Pod, MD

## 2014-11-13 ENCOUNTER — Observation Stay (HOSPITAL_BASED_OUTPATIENT_CLINIC_OR_DEPARTMENT_OTHER): Payer: 59

## 2014-11-13 ENCOUNTER — Encounter (HOSPITAL_COMMUNITY): Payer: Self-pay | Admitting: Physician Assistant

## 2014-11-13 ENCOUNTER — Observation Stay (HOSPITAL_COMMUNITY): Payer: 59

## 2014-11-13 DIAGNOSIS — R609 Edema, unspecified: Secondary | ICD-10-CM | POA: Diagnosis not present

## 2014-11-13 DIAGNOSIS — G458 Other transient cerebral ischemic attacks and related syndromes: Secondary | ICD-10-CM | POA: Diagnosis not present

## 2014-11-13 DIAGNOSIS — I209 Angina pectoris, unspecified: Secondary | ICD-10-CM | POA: Diagnosis not present

## 2014-11-13 DIAGNOSIS — I729 Aneurysm of unspecified site: Secondary | ICD-10-CM | POA: Diagnosis not present

## 2014-11-13 DIAGNOSIS — G459 Transient cerebral ischemic attack, unspecified: Secondary | ICD-10-CM | POA: Insufficient documentation

## 2014-11-13 DIAGNOSIS — R079 Chest pain, unspecified: Secondary | ICD-10-CM

## 2014-11-13 DIAGNOSIS — G629 Polyneuropathy, unspecified: Secondary | ICD-10-CM

## 2014-11-13 DIAGNOSIS — R0789 Other chest pain: Secondary | ICD-10-CM | POA: Diagnosis not present

## 2014-11-13 LAB — NM MYOCAR MULTI W/SPECT W/WALL MOTION / EF
Estimated workload: 1 METS
Exercise duration (min): 5 min
LV dias vol: 65 mL
LV sys vol: 26 mL
MPHR: 157 {beats}/min
Peak HR: 112 {beats}/min
Percent HR: 71 %
RATE: 0.23
Rest HR: 93 {beats}/min
SDS: 0
SRS: 4
SSS: 4
TID: 1.27

## 2014-11-13 LAB — GLUCOSE, CAPILLARY
Glucose-Capillary: 133 mg/dL — ABNORMAL HIGH (ref 65–99)
Glucose-Capillary: 156 mg/dL — ABNORMAL HIGH (ref 65–99)
Glucose-Capillary: 183 mg/dL — ABNORMAL HIGH (ref 65–99)
Glucose-Capillary: 182 mg/dL — ABNORMAL HIGH (ref 65–99)

## 2014-11-13 LAB — HEMOGLOBIN A1C
Hgb A1c MFr Bld: 7.4 % — ABNORMAL HIGH (ref 4.8–5.6)
Mean Plasma Glucose: 166 mg/dL

## 2014-11-13 MED ORDER — REGADENOSON 0.4 MG/5ML IV SOLN
INTRAVENOUS | Status: AC
  Administered 2014-11-13: 0.4 mg via INTRAVENOUS
  Filled 2014-11-13: qty 5

## 2014-11-13 MED ORDER — SERTRALINE HCL 50 MG PO TABS
50.0000 mg | ORAL_TABLET | Freq: Every day | ORAL | Status: DC
  Administered 2014-11-13 – 2014-11-14 (×2): 50 mg via ORAL
  Filled 2014-11-13 (×2): qty 1

## 2014-11-13 MED ORDER — ACETAMINOPHEN 325 MG PO TABS: 650.0000 mg | ORAL_TABLET | Freq: Four times a day (QID) | ORAL | Status: DC | PRN

## 2014-11-13 MED ORDER — TECHNETIUM TC 99M SESTAMIBI GENERIC - CARDIOLITE
10.0000 | Freq: Once | INTRAVENOUS | Status: AC | PRN
  Administered 2014-11-13: 10 via INTRAVENOUS

## 2014-11-13 MED ORDER — ATORVASTATIN CALCIUM 40 MG PO TABS
40.0000 mg | ORAL_TABLET | Freq: Every morning | ORAL | Status: DC
  Administered 2014-11-13 – 2014-11-15 (×3): 40 mg via ORAL
  Filled 2014-11-13 (×3): qty 1

## 2014-11-13 MED ORDER — REGADENOSON 0.4 MG/5ML IV SOLN
0.4000 mg | Freq: Once | INTRAVENOUS | Status: AC
  Administered 2014-11-13: 0.4 mg via INTRAVENOUS

## 2014-11-13 MED ORDER — HYDROCODONE-ACETAMINOPHEN 5-325 MG PO TABS
1.0000 | ORAL_TABLET | Freq: Once | ORAL | Status: AC
  Administered 2014-11-13: 1 via ORAL
  Filled 2014-11-13: qty 1

## 2014-11-13 MED ORDER — TECHNETIUM TC 99M SESTAMIBI GENERIC - CARDIOLITE
30.0000 | Freq: Once | INTRAVENOUS | Status: AC | PRN
  Administered 2014-11-13: 30 via INTRAVENOUS

## 2014-11-13 MED ORDER — ACETAMINOPHEN 325 MG PO TABS
650.0000 mg | ORAL_TABLET | Freq: Four times a day (QID) | ORAL | Status: DC | PRN
  Administered 2014-11-14: 650 mg via ORAL
  Filled 2014-11-13 (×3): qty 2

## 2014-11-13 NOTE — Progress Notes (Signed)
STROKE TEAM PROGRESS NOTE   SUBJECTIVE (INTERVAL HISTORY) Her husband is at the bedside. She recounted her HPI with Dr. Leonie Man. Overall she feels her condition is rapidly improving - she reports it had totally cleared, but after having her "test" this am, she started having numbness in her left face, also SOB with test. More confusion with this episode than with past ones. Now also with HA which is a new symptom. Pt also reports increasing anxiety. Had HA after injection.   OBJECTIVE Temp:  [97.9 F (36.6 C)-98.6 F (37 C)] 98.2 F (36.8 C) (10/05 1044) Pulse Rate:  [53-69] 63 (10/05 1044) Cardiac Rhythm:  [-] Sinus bradycardia;Bundle branch block (10/05 0700) Resp:  [18-20] 18 (10/05 1044) BP: (122-170)/(59-89) 153/77 mmHg (10/05 1044) SpO2:  [97 %-100 %] 100 % (10/05 1044)  CBC:   Recent Labs Lab 11/11/14 1558  WBC 6.5  NEUTROABS 2.8  HGB 14.3  HCT 45.5  MCV 89.2  PLT 355    Basic Metabolic Panel:   Recent Labs Lab 11/11/14 1558  NA 138  K 4.3  CL 105  CO2 25  GLUCOSE 89  BUN 16  CREATININE 0.70  CALCIUM 8.8*    Lipid Panel:     Component Value Date/Time   CHOL 150 11/12/2014 0700   TRIG 66 11/12/2014 0700   HDL 59 11/12/2014 0700   CHOLHDL 2.5 11/12/2014 0700   VLDL 13 11/12/2014 0700   LDLCALC 78 11/12/2014 0700   HgbA1c:  Lab Results  Component Value Date   HGBA1C 7.4* 11/12/2014   Urine Drug Screen: No results found for: LABOPIA, COCAINSCRNUR, LABBENZ, AMPHETMU, THCU, LABBARB    IMAGING  Ct Head Wo Contrast 11/11/2014   Stable head CT. No acute intracranial findings or explanation for the patient's symptoms.     Mr Brain Wo Contrast 11/11/2014    Normal examination.     Mr Jodene Nam Head/brain Wo Cm 11/12/2014    1. Mild branch vessel atherosclerotic changes without evidence of medium or large vessel occlusion or significant proximal stenosis. 2. 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     Carotid Doppler  There is 1-39%  bilateral ICA stenosis. Vertebral artery flow is antegrade.    2D Echocardiogram  - Left ventricle: The cavity size was normal. Systolic function was normal. The estimated ejection fraction was in the range of 60%to 65%. Wall motion was normal; there were no regional wallmotion abnormalities. There was no evidence of elevatedventricular filling pressure by Doppler parameters. - Mitral valve: Calcified annulus. Impressions: No cardiac source of emboli was indentified.   PHYSICAL EXAM Pleasant middle-aged lady who appears anxious. . Afebrile. Head is nontraumatic. Neck is supple without bruit.    Cardiac exam no murmur or gallop. Lungs are clear to auscultation. Distal pulses are well felt. Neurological Exam ;  Awake  Alert oriented x 3. Normal speech and language.Pupils were equal and reactive. Fundi were not visualized. eye movements full without nystagmus.fundi were not visualized. Vision acuity and fields appear normal. Hearing is normal. Palatal movements are normal. Face symmetric. Tongue midline. Normal strength, tone, reflexes and coordination. Normal sensation. Gait deferred. ASSESSMENT/PLAN [name] is a 64 y.o. female with history of HTN, DM, dyslipidemia, MVP presenting with complaints of facial droop, left eye ptosis, decreased sensation left face as well as stabbing sensation in her left shoulder and arm pit. She did not receive IV t-PA due to being out of the [place].   Probable R brain TIA (cannot  rule out anxiety as the etiology) Headache  Resultant  Intermittent L facial numbness, HA improved  MRI  normal  MRA  No large vessel stenosis; 53mm R cavernous ICA extradural aneurysm  Carotid Doppler  No significant stenosis   2D Echo  No source of embolus   LDL 78  HgbA1c 7.4  Heparin 5000 units sq tid for VTE prophylaxis Diet NPO time specified Except for: Sips with Meds  aspirin 325 mg orally every day prior to admission, now on aspirin 325 mg orally  every day. Continue at discharge  Ongoing aggressive stroke risk factor management  Therapy recommendations:  No PT, HH OT, 3N1  Disposition:  Return home  Hypertension  Stable  Hyperlipidemia  Home meds:  No statin  LDL 78, goal < 70  Added Lipitor 40 mg daily  Continue statin at discharge  Diabetes type II  HgbA1c 7.4, goal < 7.0  Uncontrolled  Other Stroke Risk Factors  Obesity, Body mass index is 36.77 kg/(m^2).   Other Active Problems  Intermittent CP  ANXIETY - increased per pt, ok to add xanax from neuro standpoint, at least temporarily  Goiter  MVP  GERD  OA  NOTHING FURTHER TO ADD FROM THE STROKE STANDPOINT  Patient has a 10-15% risk of having another stroke over the next year, the highest risk is within 2 weeks of the most recent stroke/TIA (risk of having a stroke following a stroke or TIA is the same).  Ongoing risk factor control by Primary Care Physician  Stroke Service will sign off. Please call should any needs arise.  Follow-up Stroke Clinic at [facility] with Dr. Antony Contras in 2 months.  Hospital day #   [place] for Pager information 11/13/2014 10:57 AM  I have personally examined this patient, reviewed notes, independently viewed imaging studies, participated in medical decision making and plan of care. I have made any additions or clarifications directly to the above note. Agree with note above. She presented with transient hand and forearm paresthesias which have resolved. There is questionable facial droop and slurred speech but the patient is not confirming this history. She remains at risk for recurrent stroke, TIA neurological worsening and agree with ongoing evaluation. Recommend she continue aspirin and start Xanax for anxiety.  Antony Contras, MD Medical Director [facility] Pager: [phone] 11/13/2014 3:16 PM    To contact Stroke Continuity  provider, please refer to [email or web address] After hours, contact General Neurology

## 2014-11-13 NOTE — Progress Notes (Signed)
Occupational Therapy Treatment Patient Details Name: Sherry [name] MRN: [id number] DOB: 1950/05/07 Today's Date: 11/13/2014    History of present illness 64 y.o. female with HTN, DM, dyslipidemia, MVP, who states she awoke on Saturday and felt she had a facial droop, left eye ptosis and decreased sensation on her left face; stabbing sensation in her left shoulder and under the arm pit; tearing of her left eye and slight drooling of her left aspect of her mouth. MRI negative for infarct.   OT comments  Feel pt is close to baseline leve with ADL and mobility today. Pt discussing her feeling of feeling anxious and the intermittent feeling of her head "being squeezed". Nsg aware.  Pt safe to ambulate @ room/hall. Discussed with NT. Do not feel pt needs further OT at this time. OT signing off.   Follow Up Recommendations  No OT follow up;Supervision - Intermittent    Equipment Recommendations  3 in 1 bedside comode  -to use as shower chair   Recommendations for Other Services      Precautions / Restrictions Restrictions Weight Bearing Restrictions: No       Mobility Bed Mobility Overal bed mobility: Modified Independent                Transfers Overall transfer level: Modified independent                    Balance    See doc flow - Balance scores (Berg 22) did not populate                           High Level Balance Comments: able to retrieve items from floor   ADL Overall ADL's : At baseline                                       General ADL Comments: pt able to complete simple ADL tasks @ mod I level with no LOB      Vision Eye Alignment: Within Functional Limits Alignment/Gaze Preference: Within Defined Limits Ocular Range of Motion: Within Functional Limits Tracking/Visual Pursuits: Able to track stimulus in all quads without difficulty Saccades: Within functional limits Convergence: Within functional limits      Additional Comments: States she has intermittent blurred vision. did not observe field loss at this time.   Perception  intact   Praxis  intact    Cognition   Behavior During Therapy: Anxious - pt discussed situation that occurred at test this am which made her "even more nervous". States " I need to learn to just let things go"  Overall Cognitive Status: Within Functional Limits for tasks assessed                       Extremity/Trunk Assessment   Pt states she no longer has L sided numbness. Strength WFL                        General Comments      Pertinent Vitals/ Pain       Pain Assessment: 0-10 Pain Score: 2  Pain Location:  head Pain Descriptors / Indicators: Discomfort Pain Intervention(s): Limited activity within patient's tolerance  Home Living  Frequency   D/C OT    Progress Toward Goals  OT Goals(current goals can now be found in the care plan section)  Progress towards OT goals: Goals met/education completed, patient discharged from OT  Acute Rehab OT Goals Patient Stated Goal: to not be so nervous OT Goal Formulation: All assessment and education complete, DC therapy Potential to Achieve Goals: Good ADL Goals Pt Will Perform Lower Body Dressing: with modified independence;sit to/from stand Pt Will Perform Tub/Shower Transfer: Tub transfer;with set-up;ambulating Additional ADL Goal #1: Pt will participate in further vision assessment.  Plan Discharge plan needs to be updated  - no further OT indicated; pt appears at baseline   Co-evaluation                 End of Session Equipment Utilized During Treatment: Gait belt   Activity Tolerance Patient tolerated treatment well   Patient Left in chair;with call bell/phone within reach   Nurse Communication Mobility status        Time: 1140-1200 OT Time Calculation (min): 20 min  Charges: OT  Treatments $Self Care/Home Management : 8-22 mins  Jetta Murray,HILLARY 11/13/2014, 12:13 PM   [facility], OTR/L  [phone] 11/13/2014

## 2014-11-13 NOTE — Progress Notes (Signed)
Patient Profile: 64 year old African-American female with past medical history of hypertension, hyperlipidemia, DM, goiter, and mitral valve prolapse presented with TIA symptoms, also noted to have intermittent chest discomfort for the past month.  Subjective: No complaints. Currently CP free.   Objective: Vital signs in last 24 hours: Temp:  [97.9 F (36.6 C)-98.8 F (37.1 C)] 97.9 F (36.6 C) (10/05 0615) Pulse Rate:  [53-69] 61 (10/05 0909) Resp:  [16-20] 18 (10/05 0909) BP: (122-170)/(59-80) 122/69 mmHg (10/05 0615) SpO2:  [97 %-100 %] 99 % (10/05 0909) Last BM Date: 11/11/14  Intake/Output from previous day: 10/04 0701 - 10/05 0700 In: 240 [P.O.:240] Out: -  Intake/Output this shift:    Medications Current Facility-Administered Medications  Medication Dose Route Frequency Provider Last Rate Last Dose  . albuterol (PROVENTIL) (2.5 MG/3ML) 0.083% nebulizer solution 3 mL  3 mL Inhalation Q6H PRN Waldemar Dickens, MD      . aspirin tablet 325 mg  325 mg Oral Daily Waldemar Dickens, MD   325 mg at 11/12/14 1023  . atorvastatin (LIPITOR) tablet 40 mg  40 mg Oral q morning - 10a Caleb G Melancon, MD      . budesonide (PULMICORT) nebulizer solution 0.5 mg  0.5 mg Nebulization BID Waldemar Dickens, MD   0.5 mg at 11/13/14 0909  . docusate sodium (COLACE) capsule 100 mg  100 mg Oral Daily Waldemar Dickens, MD   100 mg at 11/12/14 1024  . furosemide (LASIX) tablet 20 mg  20 mg Oral Daily Waldemar Dickens, MD   20 mg at 11/12/14 1023  . gabapentin (NEURONTIN) capsule 300 mg  300 mg Oral BID Waldemar Dickens, MD   300 mg at 11/12/14 2149  . heparin injection 5,000 Units  5,000 Units Subcutaneous 3 times per day Waldemar Dickens, MD   5,000 Units at 11/13/14 0608  . HYDROcodone-acetaminophen (NORCO/VICODIN) 5-325 MG per tablet 1 tablet  1 tablet Oral Q6H PRN Waldemar Dickens, MD      . insulin aspart (novoLOG) injection 0-5 Units  0-5 Units Subcutaneous QHS Waldemar Dickens, MD   0 Units at  11/12/14 0102  . insulin aspart (novoLOG) injection 0-9 Units  0-9 Units Subcutaneous TID WC Waldemar Dickens, MD   2 Units at 11/12/14 1731  . lisinopril (PRINIVIL,ZESTRIL) tablet 20 mg  20 mg Oral Daily Waldemar Dickens, MD   20 mg at 11/12/14 1024  . metoprolol tartrate (LOPRESSOR) tablet 25 mg  25 mg Oral BID Waldemar Dickens, MD   25 mg at 11/12/14 2149  . pantoprazole (PROTONIX) EC tablet 40 mg  40 mg Oral Daily Waldemar Dickens, MD   40 mg at 11/12/14 1023  . regadenoson (LEXISCAN) 0.4 MG/5ML injection SOLN           . regadenoson (LEXISCAN) injection SOLN 0.4 mg  0.4 mg Intravenous Once Brittainy M Simmons, PA-C      . senna-docusate (Senokot-S) tablet 1 tablet  1 tablet Oral QHS PRN Waldemar Dickens, MD      . sertraline (ZOLOFT) tablet 25 mg  25 mg Oral QHS Waldemar Dickens, MD   25 mg at 11/12/14 2150    PE: General appearance: alert, cooperative and no distress Neck: no carotid bruit and no JVD Lungs: clear to auscultation bilaterally Heart: regular rate and rhythm, S1, S2 normal, no murmur, click, rub or gallop Pulses: 2+ and symmetric Skin: Skin color, texture, turgor normal. No rashes or  lesions Neurologic: Grossly normal  Lab Results:   Recent Labs  11/11/14 1558  WBC 6.5  HGB 14.3  HCT 45.5  PLT 266   BMET  Recent Labs  11/11/14 1558  NA 138  K 4.3  CL 105  CO2 25  GLUCOSE 89  BUN 16  CREATININE 0.70  CALCIUM 8.8*   PT/INR  Recent Labs  11/11/14 1558  LABPROT 13.7  INR 1.03   Cholesterol  Recent Labs  11/12/14 0700  CHOL 150   Cardiac Panel (last 3 results) No results for input(s): CKTOTAL, CKMB, TROPONINI, RELINDX in the last 72 hours.  Studies/Results: NST pending   Assessment/Plan  Principal Problem:   TIA (transient ischemic attack) Active Problems:   Diabetes mellitus with complication (HCC)   Essential hypertension   Dependent edema   Peripheral neuropathy (HCC)   Depression   Facial droop   Chest pain    1. Chest Pain:  Atypical features but with multiple risk factors. CT of chest with only mild coronary calcium. NST pending to r/o ischemia.   2. TIA: per neuro. Can consider EP consult for possible loop recorder to r/o atrial fibrillation.   3. HTN: BP is well controlled on metoprolol and lisinopril.    Brittainy M. Rosita Fire, PA-C 11/13/2014 9:30 AM  For lexiscan today.  Atypical pain.  Carotid pending.  BP under good control  On ACE and beta blocker Exam benign further w/u based on myovue results  [employer]

## 2014-11-13 NOTE — H&P (Signed)
Chief Complaint: Facial droop Left face "heaviness" Left arm and leg tingling  Referring Physician(s): Caleb Melancon  History of Present Illness: Sherry [name] is a 64 y.o. female who presented c/o left leg arm and leg tingling.  She also reports that her left face felt like it was drooping and felt "heavy".  She also states she felt like she couldn't open her left eye.  She does take ASA daily  She is a diabetic.  MRA shows a 5 x 2 mm posteroinferiorly directed outpouching from the anterior right cavernous carotid artery consistent with aneurysm. The left A1 segment is absent. Left A2 is supplied by the anterior communicating artery. ACAs and MCAs are patent without evidence of major branch vessel occlusion or significant proximal stenosis  We are asked to evaluate her for possible cerebral angiogram and RT MCA angioplasty.   Past Medical History  Diagnosis Date  . Diabetes mellitus   . Hypertension   . GERD (gastroesophageal reflux disease)   . Dyslipidemia   . Mitral valve prolapse occasional palpitations w/ chest discomfort  . DJD (degenerative joint disease)   . Meniere's disease   . Rotator cuff tear, left   . History of CHF (congestive heart failure) 2010-  RESOLVED  . H/O hiatal hernia   . Complication of anesthesia HYPOTENSION INTRAOP W/ HYSTERECTOMY IN 1987--  DID OK W/ TOTAL KNEE IN 2008  . OA (osteoarthritis)   . History of peptic ulcer YRS AGO    Past Surgical History  Procedure Laterality Date  . Total knee arthroplasty  09-12-2006  [facility]    RIGHT KNEE  . Vaginal hysterectomy  1987  . Bunionectomy  2007    LEFT FOOT  . Tympanoplasty  1990;   1980  . Left shoulder surgery  1997    ROTATOR CUFF REPAIR  . Tubal ligation  1976  . Shoulder arthroscopy  09/20/2011    Procedure: ARTHROSCOPY SHOULDER;  Surgeon: Johnn Hai, MD;  Location: [facility];  Service: Orthopedics;  Laterality: Left;  SAD AND MINI OPEN ROTATOR CUFF REPAIR      Allergies: Influenza vaccines; Codeine; Demerol; Nsaids; Prednisone; and Tramadol  Medications: Prior to Admission medications   Medication Sig Start Date End Date Taking? Authorizing Provider  albuterol (PROVENTIL HFA;VENTOLIN HFA) 108 (90 BASE) MCG/ACT inhaler Inhale 2 puffs into the lungs every 6 (six) hours as needed. For shortness of breath.   Yes Historical Provider, MD  albuterol (PROVENTIL) (2.5 MG/3ML) 0.083% nebulizer solution Take 3 mLs (2.5 mg total) by nebulization every 6 (six) hours as needed for wheezing or shortness of breath. 06/24/14  Yes Darlyne Russian, MD  aspirin 325 MG tablet Take 325 mg by mouth daily.   Yes Historical Provider, MD  cetirizine (ZYRTEC) 10 MG tablet Take 10 mg by mouth daily.   Yes Historical Provider, MD  docusate sodium (COLACE) 100 MG capsule Take 100 mg by mouth daily.   Yes Historical Provider, MD  empagliflozin (JARDIANCE) 10 MG TABS tablet Take 10 mg by mouth daily. 08/06/14  Yes Renato Shin, MD  furosemide (LASIX) 20 MG tablet Take 1 tablet (20 mg total) by mouth daily. 08/06/14  Yes Renato Shin, MD  gabapentin (NEURONTIN) 300 MG capsule Take 1 capsule (300 mg total) by mouth 2 (two) times daily. 07/10/14  Yes Gay Filler Copland, MD  HYDROcodone-acetaminophen (NORCO/VICODIN) 5-325 MG per tablet Take 1 tablet by mouth every 6 (six) hours as needed for moderate pain.   Yes Historical  Provider, MD  lisinopril (PRINIVIL,ZESTRIL) 20 MG tablet TAKE 1 TABLET BY MOUTH DAILY 08/27/14  Yes Gay Filler Copland, MD  metFORMIN (GLUCOPHAGE) 500 MG tablet Take 2 tablets (1,000 mg total) by mouth 2 (two) times daily with a meal. PATIENT NEEDS OFFICE VISIT FOR ADDITIONAL REFILLS Patient taking differently: Take 1,000 mg by mouth 2 (two) times daily with a meal.  12/19/13  Yes Chelle Jeffery, PA-C  metoprolol tartrate (LOPRESSOR) 25 MG tablet Take 1 tablet (25 mg total) by mouth 2 (two) times daily. 07/10/14  Yes Gay Filler Copland, MD  mometasone (ASMANEX) 220 MCG/INH  inhaler Use 1 puff daily followed by rinsing your mouth out with water. 06/24/14  Yes Darlyne Russian, MD  omeprazole (PRILOSEC) 20 MG capsule Take 20 mg by mouth.   Yes Historical Provider, MD  repaglinide (PRANDIN) 2 MG tablet Take 1 tablet (2 mg total) by mouth 3 (three) times daily before meals. 09/19/14  Yes Renato Shin, MD  sertraline (ZOLOFT) 25 MG tablet TAKE 1 TABLET BY MOUTH AT BEDTIME 09/09/14  Yes Gay Filler Copland, MD  Insulin Pen Needle 30G X 5 MM MISC 1 each by Does not apply route daily. 03/04/14   Harrison Mons, PA-C     Family History  Problem Relation Age of Onset  . Heart disease Mother     in her 101s  . Diabetes Mother   . Kidney disease Mother   . Thyroid disease Mother   . Thyroid disease Brother   . Thyroid disease Sister   . Thyroid disease Paternal Grandmother   . Heart attack Sister     at age 68    Social History   Social History  . Marital Status: Married    Spouse Name: N/A  . Number of Children: N/A  . Years of Education: N/A   Social History Main Topics  . Smoking status: Never Smoker   . Smokeless tobacco: Never Used  . Alcohol Use: No  . Drug Use: No  . Sexual Activity: Not Asked   Other Topics Concern  . None   Social History Narrative    Review of Systems  Constitutional: Negative for fever, chills, activity change, appetite change and fatigue.  HENT: Negative.   Respiratory: Negative for cough, chest tightness and shortness of breath.   Cardiovascular: Negative for chest pain.  Gastrointestinal: Negative for nausea, vomiting and abdominal pain.  Genitourinary: Negative.   Musculoskeletal: Positive for gait problem. Negative for myalgias and arthralgias.  Skin: Negative.   Neurological: Positive for facial asymmetry and numbness. Negative for seizures, syncope, speech difficulty and light-headedness.  Psychiatric/Behavioral: Negative.     Vital Signs: BP 153/77 mmHg  Pulse 63  Temp(Src) 98.2 F (36.8 C) (Oral)  Resp 18  Ht 5'  2" (1.575 m)  Wt 201 lb 1.6 oz (91.218 kg)  BMI 36.77 kg/m2  SpO2 100%  Physical Exam  Constitutional: She is oriented to person, place, and time. She appears well-developed and well-nourished.  HENT:  Head: Normocephalic and atraumatic.  Eyes: EOM are normal.  Neck: Normal range of motion. Neck supple.  Cardiovascular: Normal rate, regular rhythm and normal heart sounds.   Pulmonary/Chest: Effort normal and breath sounds normal. She has no wheezes.  Abdominal: Soft. Bowel sounds are normal. She exhibits no distension.  Musculoskeletal: Normal range of motion.  Neurological: She is alert and oriented to person, place, and time. No cranial nerve deficit.  Skin: Skin is warm and dry.  Psychiatric: She has a normal mood  and affect. Her behavior is normal. Judgment and thought content normal.  Vitals reviewed.   Mallampati Score:  MD Evaluation Airway: WNL Heart: WNL Abdomen: WNL Chest/ Lungs: WNL ASA  Classification: 2 Mallampati/Airway Score: Two  Imaging: Ct Head Wo Contrast  11/11/2014   CLINICAL DATA:  Headaches with left facial and mouth tingling, drooling and left eyelid weakness for 1 week. Initial encounter.  EXAM: CT HEAD WITHOUT CONTRAST  TECHNIQUE: Contiguous axial images were obtained from the base of the skull through the vertex without intravenous contrast.  COMPARISON:  Head CT 11/19/2011.  FINDINGS: There is no evidence of acute intracranial hemorrhage, mass lesion, brain edema or extra-axial fluid collection. The ventricles and subarachnoid spaces are appropriately sized for age. There is no CT evidence of acute cortical infarction.  The visualized paranasal sinuses, mastoid air cells and middle ears are clear. The calvarium is intact. Calvarial hyperostosis and prominent falcine calcification again noted.  IMPRESSION: Stable head CT. No acute intracranial findings or explanation for the patient's symptoms.   Electronically Signed   By: Richardean Sale M.D.   On:  11/11/2014 17:17   Mr Brain Wo Contrast  11/11/2014   CLINICAL DATA:  Intermittent tingling and left-sided facial droop. Some associated blurred vision. Symptoms began 14 hr ago and have been waxing and waning.  EXAM: MRI HEAD WITHOUT CONTRAST  TECHNIQUE: Multiplanar, multiecho pulse sequences of the brain and surrounding structures were obtained without intravenous contrast.  COMPARISON:  Head CT earlier same day.  FINDINGS: The brain has a normal appearance on all pulse sequences without evidence of malformation, atrophy, old or acute infarction, mass lesion, hemorrhage, hydrocephalus or extra-axial collection. No pituitary mass. No fluid in the sinuses, middle ears or mastoids. No skull or skullbase lesion. There is flow in the major vessels at the base of the brain. Major venous sinuses show flow.  IMPRESSION: Normal examination.   Electronically Signed   By: Nelson Chimes M.D.   On: 11/11/2014 21:04   Mr Jodene Nam Head/brain Wo Cm  11/12/2014   CLINICAL DATA:  Left facial droop.  TIA.  EXAM: MRA HEAD WITHOUT CONTRAST  TECHNIQUE: Angiographic images of the Circle of Willis were obtained using MRA technique without intravenous contrast.  COMPARISON:  None.  FINDINGS: Visualized distal vertebral arteries are patent and equal in size without stenosis. PICA, AICA, and SCA origins are patent. Basilar artery is patent without stenosis. There is a fetal type origin of the left PCA. PCAs are patent with mild P2 and more distal branch vessel irregularity and narrowing bilaterally but no high-grade proximal stenosis.  Internal carotid arteries are patent from skullbase to carotid termini without stenosis. There is a 5 x 2 mm posteroinferiorly directed outpouching from the anterior right cavernous carotid artery consistent with aneurysm. The left A1 segment is absent. Left A2 is supplied by the anterior communicating artery. ACAs and MCAs are patent without evidence of major branch vessel occlusion or significant proximal  stenosis. There is mild MCA branch vessel attenuation bilaterally.  IMPRESSION: 1. Mild branch vessel atherosclerotic changes without evidence of medium or large vessel occlusion or significant proximal stenosis. 2. 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   Electronically Signed   By: Logan Bores M.D.   On: 11/12/2014 11:07    Labs:  CBC:  Recent Labs  02/14/14 2018 06/24/14 1002 06/25/14 1810 11/11/14 1558  WBC 14.1* 7.2 6.6 6.5  HGB 13.2 13.4 12.8 14.3  HCT 41.6 42.7 40.1 45.5  PLT  292  --  269 266    COAGS:  Recent Labs  11/11/14 1558  INR 1.03  APTT 32    BMP:  Recent Labs  02/09/14 1756 02/14/14 2018 06/25/14 1810 11/11/14 1558  NA 141 137 142 138  K 4.4 3.9 3.4* 4.3  CL 103 104 108 105  CO2 28 26 24 25   GLUCOSE 171* 274* 276* 89  BUN 17 21 13 16   CALCIUM 9.4 8.7 8.6* 8.8*  CREATININE 0.86 0.77 0.67 0.70  GFRNONAA  --  88* >60 >60  GFRAA  --  >90 >60 >60    LIVER FUNCTION TESTS:  Recent Labs  02/09/14 1756 11/11/14 1558  BILITOT 0.3 0.4  AST 13 20  ALT 14 13*  ALKPHOS 75 71  PROT 7.0 6.4*  ALBUMIN 4.0 3.7    TUMOR MARKERS: No results for input(s): AFPTM, CEA, CA199, CHROMGRNA in the last 8760 hours.  Assessment and Plan:  Dr. Estanislado Pandy has seen and discussed options with patient  TIA symptoms, on aspirin  MCA aneurysm on MRA.  Will plan for Cerebral angiogram with Dr. Estanislado Pandy on Friday 10/7  Risks and Benefits discussed with the patient including, but not limited to bleeding, infection, vascular injury or contrast induced renal failure.  All of the patient's questions were answered, patient is agreeable to proceed. Consent signed and in chart.   Thank you for this interesting consult.  I greatly enjoyed meeting Sherry [name] and look forward to participating in their care.  A copy of this report was sent to the requesting provider on this date.  Signed: Murrell Redden PA-C 11/13/2014, 12:44 PM   I  spent a total of 40 Minutes   in face to face in clinical consultation, greater than 50% of which was counseling/coordinating care for cerebral angiogram.

## 2014-11-13 NOTE — Progress Notes (Signed)
Family Medicine Teaching Service Daily Progress Note Intern Pager: [phone]  Patient name: Sherry [name] Medical record number: [id number] Date of birth: 06-02-1950 Age: 64 y.o. Gender: female  Primary Care Provider: [name], MD Consultants: neurology, neuro IR  Code Status: FULL  Pt Overview and Major Events to Date:  Pt tolerated myoview well, endorses HA (+photophobia + nausea) and anxiety today.  Assessment and Plan: TIA: Pt had a similar TIA in 2013, which also began with L sided facial droop. All symptoms resolved. Doesn't recall her most recent cardiology visit. Reports taking her ASA 325mg  "religiously." CT head NAICA, MRI normal. MRA, carotid dopplers, echo pending. EKG x 2 in ED not concerning for ischemia. Lipid panel WNL. Echo EF 60-65%. Carotid dopplers 1-39% stenosis bilaterally. MRA found 48mm right cavernous ICA aneurysm. PT recommended no PT followup. SLP signed off, no concerns. OT rec'd home health OT and 3 in 1 bedside comode (flat shower chair versus tub bench).  - neuro interventional radiology recommends cerebral angiogram 10/7 - neurology signed off, rec'd continued ASA 325, no loop recorder - continue ASA 325mg   Chest pain / angina symptoms: On initial evaluation, pt reported chest tightness with exertion, SOB, diaphoresis, and nausea x 1 month intermittently. Occurs with uncomfortable senstation behind L scapula. I-stat trop neg here. EKG x 2 no acute changes. Lost cardiology followup several years ago when her cardiologist moved away. Myoview in 2010 was normal.  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   HTN: continue home lisinopril 20mg  and metoprolol 25mg  BID.   Asthma: stable per patient. She reports using proventil inhaler intermittently based on seasonal changes rather than daily.  - albuterol prn wheezing.   Hyperthyroidism: s/p radioiodine ablation  06/16. Follows with endocrine (Dr. Loanne Drilling). TSH last 0.8.   Dependent edema: continue home lasix 20mg  daily.  Peripheral neuropathy: continue home gabapentin 300mg .   GERD: continue home PPI (protonix 40mg ).   Depression / Anxiety: pt. Endorsing depression control but has some anxiety. On zoloft 25mg  and agreeable to go up on this to see if anxiety improves.   FEN/GI: PPI PPx: heparin 5,000 units  Disposition: pending myoview read  Subjective:  Pt endorses HA with + photophobia and nausea today. She also endorses increased anxiety for a month or more. She states that she has tried zoloft 50mg  BID at home, but this "zonked" her out too much. She would be amenable to dose adjustments or trying another medicine for anxiety control.  Objective: Temp:  [97.9 F (36.6 C)-98.8 F (37.1 C)] 97.9 F (36.6 C) (10/05 0615) Pulse Rate:  [53-69] 53 (10/05 0615) Resp:  [16-20] 18 (10/05 0615) BP: (122-170)/(59-80) 122/69 mmHg (10/05 0615) SpO2:  [97 %-100 %] 98 % (10/05 0615) Physical Exam: General: obese female in NAD resting in chair Cardiovascular: RRR, no m/r/g Respiratory: CTAG, no wheezes, rhonchi, crackles Abdomen: NTND, BS+ Extremities: no LE edema, no rashes or wounds  Laboratory:  Recent Labs Lab 11/11/14 1558  WBC 6.5  HGB 14.3  HCT 45.5  PLT 266    Recent Labs Lab 11/11/14 1558  NA 138  K 4.3  CL 105  CO2 25  BUN 16  CREATININE 0.70  CALCIUM 8.8*  PROT 6.4*  BILITOT 0.4  ALKPHOS 71  ALT 13*  AST 20  GLUCOSE 89    Imaging/Diagnostic Tests: MRA - 82mm Right cavernous ICA aneurysm   Sela Hilding, Med Student  11/13/2014, 8:13 AM Medical student [phone]) [place] Intern pager: [phone], text pages welcome   I agree with the above evaluation, assessment, and plan. Any correctional changes can be noted in [facility].   Aquilla Hacker, MD Family Medicine Resident - PGY 1  O:  Filed Vitals:   11/13/14 1439  BP: 137/76   Pulse: 74  Temp: 98.1 F (36.7 C)  Resp: 16  Gen: Resting comfortably in bed, AAOx3 HEENT: NCAT, PERRLA, EOMI CV: RRR, No MGR, normal S1/S2 Resp: CTA Bilaterally, appropriate rate, unlabored Abd: S, NT, ND, +BS Ext: WWP, 2+ distal pulses, MAEW Neuro: No new deficits. No changes,  Psych: Appropriate mood / affect.   A/P: Pt. Is a 64 y/o AAF here with TIA symptoms that have resolved. Workup for stroke negative to date, but found to have a 54mm extradural ICA aneurysm. Additionally, with anginal symptoms by history requiring workup.   # TIA:  - Carotids without significant stenosis - Echo unremarkable.  - MRI/ MRA with 70mm aneurysm - Neuro consulting: Recs ASA, no plavix and no loop recorder - Neurointerventional Radiology consulting: recs Cerebral angiogram on 10/7 - Continue ASA 325mg  for now.  - Lipitor - HTN / DMII control.  - PT/OT/SLP rec no follow up.   # Angina:  Pt. With hx of typical / atypical symptoms - Cardiology consulting appreciate recs - f/u Myovue results.  - No further symptoms here.  - Cards recs for further intervention based on imaging results.   # Depression / Anxiety: Pt. With complaint of anxiety.  - She says that this is related to her current medical problems / home stress - Will increase Zoloft to 50mg  as she is currently at the lowest dose and would rather this than adding any additional medication.  - Will need continued outpatient follow up for management of this problem.   Paula Compton, MD Family Medicine - PGY2

## 2014-11-14 ENCOUNTER — Observation Stay (HOSPITAL_COMMUNITY): Payer: 59

## 2014-11-14 DIAGNOSIS — R51 Headache: Secondary | ICD-10-CM

## 2014-11-14 DIAGNOSIS — I729 Aneurysm of unspecified site: Secondary | ICD-10-CM | POA: Diagnosis not present

## 2014-11-14 DIAGNOSIS — G458 Other transient cerebral ischemic attacks and related syndromes: Secondary | ICD-10-CM | POA: Diagnosis not present

## 2014-11-14 DIAGNOSIS — G44039 Episodic paroxysmal hemicrania, not intractable: Secondary | ICD-10-CM

## 2014-11-14 DIAGNOSIS — R609 Edema, unspecified: Secondary | ICD-10-CM | POA: Diagnosis not present

## 2014-11-14 DIAGNOSIS — R0789 Other chest pain: Secondary | ICD-10-CM | POA: Diagnosis not present

## 2014-11-14 DIAGNOSIS — R519 Headache, unspecified: Secondary | ICD-10-CM | POA: Insufficient documentation

## 2014-11-14 LAB — BASIC METABOLIC PANEL
Anion gap: 7 (ref 5–15)
BUN: 16 mg/dL (ref 6–20)
CO2: 28 mmol/L (ref 22–32)
Calcium: 8.9 mg/dL (ref 8.9–10.3)
Chloride: 102 mmol/L (ref 101–111)
Creatinine, Ser: 0.64 mg/dL (ref 0.44–1.00)
GFR calc Af Amer: >60 mL/min (ref >60)
GFR calc non Af Amer: >60 mL/min (ref >60)
Glucose, Bld: 156 mg/dL — ABNORMAL HIGH (ref 65–99)
Potassium: 3.9 mmol/L (ref 3.5–5.1)
Sodium: 137 mmol/L (ref 135–145)

## 2014-11-14 LAB — GLUCOSE, CAPILLARY
Glucose-Capillary: 144 mg/dL — ABNORMAL HIGH (ref 65–99)
Glucose-Capillary: 136 mg/dL — ABNORMAL HIGH (ref 65–99)
Glucose-Capillary: 160 mg/dL — ABNORMAL HIGH (ref 65–99)
Glucose-Capillary: 193 mg/dL — ABNORMAL HIGH (ref 65–99)

## 2014-11-14 MED ORDER — TRAMADOL HCL 50 MG PO TABS
50.0000 mg | ORAL_TABLET | Freq: Four times a day (QID) | ORAL | Status: DC | PRN
  Administered 2014-11-14: 50 mg via ORAL
  Filled 2014-11-14: qty 1

## 2014-11-14 MED ORDER — ASPIRIN EC 81 MG PO TBEC
81.0000 mg | DELAYED_RELEASE_TABLET | Freq: Every day | ORAL | Status: DC
  Administered 2014-11-15: 81 mg via ORAL
  Filled 2014-11-14: qty 1

## 2014-11-14 MED ORDER — HYDROCODONE-ACETAMINOPHEN 5-325 MG PO TABS: 1.0000 | ORAL_TABLET | Freq: Four times a day (QID) | ORAL | Status: DC | PRN

## 2014-11-14 NOTE — Progress Notes (Signed)
OT Note - Addendum    Nov 17, 2014 1230  OT Visit Information  Last OT Received On November 17, 2014  OT G-codes **NOT FOR INPATIENT CLASS**  Functional Assessment Tool Used clinical judgement  Functional Limitation Self care  Self Care Current Status [phone]) CI  Self Care Goal Status [phone]) CI  [facility], OTR/L  [phone] Nov 17, 2014

## 2014-11-14 NOTE — Progress Notes (Signed)
Family Medicine Teaching Service Daily Progress Note Intern Pager: [phone]  Patient name: Sherry [name] Medical record number: [id number] Date of birth: 15-Nov-1950 Age: 64 y.o. Gender: female  Primary Care Provider: [name], MD Consultants: neurology, cardiology, neuro IR Code Status: FULL  Pt Overview and Major Events to Date:  Pt reports severe headache that came on last night at 6:45pm, it was the worst headache she's ever had. CT stat ordered.  Assessment and Plan: TIA: Pt had a similar TIA in 2013, which also began with L sided facial droop. All symptoms resolved. Doesn't recall her most recent cardiology visit. Reports taking her ASA 325mg  "religiously." CT head NAICA, MRI normal. MRA, carotid dopplers, echo pending. EKG x 2 in ED not concerning for ischemia. Lipid panel WNL. Echo EF 60-65%. Carotid dopplers 1-39% stenosis bilaterally. 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 PT recommended no PT followup. SLP signed off, no concerns. OT rec'd home health OT and 3 in 1 bedside comode (flat shower chair versus tub bench).  - neuro interventional radiology recommends cerebral angiogram 10/7 - neurology signed off, rec'd continued ASA 325, no loop recorder - continue ASA 325mg   89mm R cavernous ICA aneurysm: Neuro IR consulting, cerebral angiogram ordered 10/7. Pt reportedly having new headaches over the past month or so, she felt due to anxiety. Last night at 6:45pm, she experienced a new headache that was the worst she has ever had. It was "25/10" and required norco. She endorses double vision at that time, as well as pain in the trigeminal distribution and behind R eye. No neurological deficits on exam this am. Known aneurysm found in 64mm R cavernous ICA on MRA.  - CT head without contrast STAT ordered - appreciate neuro IR recs  Chest pain / angina symptoms: On initial evaluation, pt reported chest tightness with exertion, SOB, diaphoresis, and nausea x 1 month  intermittently. Occurs with uncomfortable senstation behind L scapula. I-stat trop neg here. EKG x 2 no acute changes. Lost cardiology followup several years ago when her cardiologist moved away. Myoview in 2010 was normal. Myoview exam normal. - added atorvastatin 40mg  as pt ASCVD risk is >10% - continue home ASA as above  DMII: SSI while inpatient. Pt reports being on metformin BID and repaglinide TID with meals at home per endocrinology. She reports that she got a few low CBGs, including 59 once, so she now only takes her repaglinide BID with meals.  - recommend outpatient f/u with existing endocrinologist (Dr. Loanne Drilling).  HTN: continue home lisinopril 20mg  and metoprolol 25mg  BID.   Asthma: stable per patient. She reports using proventil inhaler intermittently based on seasonal changes rather than daily.  - albuterol prn wheezing.   Hyperthyroidism: s/p radioiodine ablation 06/16. Follows with endocrine (Dr. Loanne Drilling). TSH last 0.8.   Dependent edema: continue home lasix 20mg  daily.  Peripheral neuropathy: continue home gabapentin 300mg .   GERD: continue home PPI (protonix 40mg ).   Depression / Anxiety: pt. Endorsing depression control but has some anxiety. On zoloft 25mg  and agreeable to go up on this to see if anxiety improves.   FEN/GI: PPI PPx: heparin 5,000 units  Disposition: continue monitoring on FPTS.  Subjective:  Pt reports that a severe headache came on last night at 6:45pm, which was "25/10," and the worst she has ever had. She reports a tight band feeling around her head, with concentration of the pain behind her right eye and on her R face. She reports double vision at the time of the headache.  Objective: Temp:  [97.7 F (36.5 C)-98.5 F (36.9 C)] 98.5 F (36.9 C) (10/06 0607) Pulse Rate:  [51-74] 52 (10/06 0607) Resp:  [15-20] 20 (10/06 0607) BP: (124-170)/(50-89) 124/50 mmHg (10/06 0607) SpO2:  [98 %-100 %] 99 % (10/06 0607) Physical Exam: General:  Overweight female in NAD resting in bed and eating breakfast Cardiovascular: RRR, no m/r/g Respiratory: CTAB Abdomen: NTND +BS Extremities: no LE edema, rashes or wounds Neuro: CN III-XII grossly intact; 5/5 strength in all extremities; sensation intact in all distributions  Laboratory:  Recent Labs Lab 11/11/14 1558  WBC 6.5  HGB 14.3  HCT 45.5  PLT 266    Recent Labs Lab 11/11/14 1558 11/14/14 0710  NA 138 137  K 4.3 3.9  CL 105 102  CO2 25 28  BUN 16 16  CREATININE 0.70 0.64  CALCIUM 8.8* 8.9  PROT 6.4*  --   BILITOT 0.4  --   ALKPHOS 71  --   ALT 13*  --   AST 20  --   GLUCOSE 89 156*    Imaging/Diagnostic Tests: Myoview - normal study, no evidence for ischemia or infarction  Sela Hilding, Med Student 11/14/2014, 8:17 AM Medical student [phone]), [place] Intern pager: [phone], text pages welcome  I agree with the above evaluation, assessment, and plan. Any correctional changes can be noted in [facility]   Aquilla Hacker, MD Family Medicine Resident - PGY 2  O:  Filed Vitals:   11/14/14 0948  BP: 149/58  Pulse: 64  Temp: 98.4 F (36.9 C)  Resp: 18   Gen: NAD, AAOx3 HEENT: NCAT, PERRLA, EOMI,  CV: RRR,No MGR, normal S1/S2, 2+ distal pulses.  Resp: CTA Bilaterally, appropriate rate, unlabored.  Abd: S, NT, ND, +BS Ext: WWP, 2+ distal pulses, MAEW Skin: no rashes, no lesions.  Neuro: AAOx3, CN II-XII in tact to exam, 5/5 motor in all major muscle groups, No sensory deficits at this time, Cerebellar in tact, ambulation deferred at this time.  Psych: Mood / Affect appropriate.   A/P: Pt. Is a 64 y/o AAF here with TIA symptoms that have resolved. Workup for stroke negative to date, but found to have a 36mm extradural ICA aneurysm. Severe headache overnight with some ? Neurological changes i.e. Double vision / facial sensation changes. Additionally, history of anginal symptoms being worked up by cardiology.   # TIA:  -  workup unremarkable.  - MRI/ MRA with 12mm aneurysm - Neuro consulting: Recs ASA 81 mg confirmed today, no plavix - Neurointerventional Radiology consulting: recs Cerebral angiogram on 10/7 - Continue ASA 81 mg for now.   - Lipitor - HTN / DMII control.  - PT/OT/SLP rec no follow up.   # Aneurysm : pt. With 64mm extradural ICA aneurysm on MRI. Neurointerventional radiology consulting. Severe headache overnight with ? Neurological changes including double vision, facial numbness, and "band feeling" around her head unrelieved by tylenol / norco.  - Tylenol prn for headache.  - avoiding NSAID's at this time.  - CT head ordered this morning and negative.  - Cerebral angiogram for tomorrow per neuro IR. Discussed overnight symptoms with them, and they have no further recs at this time.  - ? Symptomatic aneurysm given location. Will await results of cerebral angiogram and recs from NeuroIR.   # Angina: Pt. With hx of typical / atypical symptoms - Cardiology consulting appreciate recs - Myovue low risk.  - Cards recs Loop Recorder given her history and TIA symptoms.  -  Consulted EP for evaluation.  - Outpatient f/u with cards.   # Depression / Anxiety: Pt. With complaint of anxiety. Feeling better after increased dose of Zoloft.  - She says that this is related to her current medical problems / home stress - Continue Zoloft 50mg .  - Will need continued outpatient follow up for management of this problem.   Paula Compton, MD Family Medicine - PGY2

## 2014-11-14 NOTE — Consult Note (Signed)
ELECTROPHYSIOLOGY CONSULT NOTE  Patient ID: Sherry [name] MRN: [id number], DOB/AGE: 1950-05-07   Admit date: 11/11/2014 Date of Consult: 11/14/2014  Primary Physician: [name], MD Primary Cardiologist: Johnsie Cancel - new this admission Reason for Consultation: TIA vs migraine; recommendations regarding Implantable Loop Recorder  History of Present Illness: Sherry [name] was admitted on 11/11/2014 with facial droop, left eye ptosis and numbness that occurred when she awoke on Saturday. She had no symptoms preceding these and her symptoms improved over the next day but did not completely resolve. She then developed "tingling" of her face and presented to the ER for further evalaution.   Imaging demonstrated no stroke. She has been seen by neurology who feels that this was either a TIA vs complex migraine but is not convinced of TIA.    She has undergone workup for stroke including echocardiogram and carotid dopplers.  The patient has been monitored on telemetry which has demonstrated sinus rhythm with no arrhythmias. TEE is not planned.   Echocardiogram this admission demonstrated EF 60-65%, no RWMA, calcified mitral valve annulus, LA 31.  Lab work is reviewed.  The patient also had atypical chest pain and underwent myoview which was normal.   Prior to admission, the patient denies shortness of breath, dizziness, palpitations, or syncope.  They are recovering from their symptoms with plans to return home at discharge.  EP has been asked to evaluate for placement of an implantable loop recorder to monitor for atrial fibrillation.   Past Medical History  Diagnosis Date  . Diabetes mellitus   . Hypertension   . GERD (gastroesophageal reflux disease)   . Dyslipidemia   . Mitral valve prolapse occasional palpitations w/ chest discomfort  . DJD (degenerative joint disease)   . Meniere's disease   . Rotator cuff tear, left   . History of CHF (congestive heart failure) 2010-   RESOLVED  . H/O hiatal hernia   . Complication of anesthesia HYPOTENSION INTRAOP W/ HYSTERECTOMY IN 1987--  DID OK W/ TOTAL KNEE IN 2008  . OA (osteoarthritis)   . History of peptic ulcer YRS AGO     Surgical History:  Past Surgical History  Procedure Laterality Date  . Total knee arthroplasty  09-12-2006  [facility]    RIGHT KNEE  . Vaginal hysterectomy  1987  . Bunionectomy  2007    LEFT FOOT  . Tympanoplasty  1990;   1980  . Left shoulder surgery  1997    ROTATOR CUFF REPAIR  . Tubal ligation  1976  . Shoulder arthroscopy  09/20/2011    Procedure: ARTHROSCOPY SHOULDER;  Surgeon: Johnn Hai, MD;  Location: [facility];  Service: Orthopedics;  Laterality: Left;  SAD AND MINI OPEN ROTATOR CUFF REPAIR       Prescriptions prior to admission  Medication Sig Dispense Refill Last Dose  . albuterol (PROVENTIL HFA;VENTOLIN HFA) 108 (90 BASE) MCG/ACT inhaler Inhale 2 puffs into the lungs every 6 (six) hours as needed. For shortness of breath.   Past Month at Unknown time  . albuterol (PROVENTIL) (2.5 MG/3ML) 0.083% nebulizer solution Take 3 mLs (2.5 mg total) by nebulization every 6 (six) hours as needed for wheezing or shortness of breath. 150 mL 1 Past Month at Unknown time  . aspirin 325 MG tablet Take 325 mg by mouth daily.   11/11/2014 at Unknown time  . cetirizine (ZYRTEC) 10 MG tablet Take 10 mg by mouth daily.   11/11/2014 at Unknown time  . docusate sodium (COLACE) 100 MG  capsule Take 100 mg by mouth daily.   11/11/2014 at Unknown time  . empagliflozin (JARDIANCE) 10 MG TABS tablet Take 10 mg by mouth daily. 30 tablet 11 11/11/2014 at Unknown time  . furosemide (LASIX) 20 MG tablet Take 1 tablet (20 mg total) by mouth daily. 30 tablet 3 11/11/2014 at Unknown time  . gabapentin (NEURONTIN) 300 MG capsule Take 1 capsule (300 mg total) by mouth 2 (two) times daily. 60 capsule 5 11/11/2014 at Unknown time  . HYDROcodone-acetaminophen (NORCO/VICODIN) 5-325 MG per tablet Take 1  tablet by mouth every 6 (six) hours as needed for moderate pain.   11/10/2014 at Unknown time  . lisinopril (PRINIVIL,ZESTRIL) 20 MG tablet TAKE 1 TABLET BY MOUTH DAILY 30 tablet 4 11/11/2014 at Unknown time  . metFORMIN (GLUCOPHAGE) 500 MG tablet Take 2 tablets (1,000 mg total) by mouth 2 (two) times daily with a meal. PATIENT NEEDS OFFICE VISIT FOR ADDITIONAL REFILLS (Patient taking differently: Take 1,000 mg by mouth 2 (two) times daily with a meal. ) 120 tablet 0 11/11/2014 at Unknown time  . metoprolol tartrate (LOPRESSOR) 25 MG tablet Take 1 tablet (25 mg total) by mouth 2 (two) times daily. 60 tablet 5 11/11/2014 at 0800  . mometasone (ASMANEX) 220 MCG/INH inhaler Use 1 puff daily followed by rinsing your mouth out with water. 1 Inhaler 12 11/11/2014 at Unknown time  . omeprazole (PRILOSEC) 20 MG capsule Take 20 mg by mouth.   11/11/2014 at Unknown time  . repaglinide (PRANDIN) 2 MG tablet Take 1 tablet (2 mg total) by mouth 3 (three) times daily before meals. 90 tablet 11 11/11/2014 at Unknown time  . sertraline (ZOLOFT) 25 MG tablet TAKE 1 TABLET BY MOUTH AT BEDTIME 90 tablet 1 11/10/2014 at Unknown time  . Insulin Pen Needle 30G X 5 MM MISC 1 each by Does not apply route daily. 200 each 3 Taking    Inpatient Medications:  . aspirin EC  81 mg Oral Daily  . atorvastatin  40 mg Oral q morning - 10a  . budesonide  0.5 mg Nebulization BID  . docusate sodium  100 mg Oral Daily  . furosemide  20 mg Oral Daily  . gabapentin  300 mg Oral BID  . heparin  5,000 Units Subcutaneous 3 times per day  . insulin aspart  0-5 Units Subcutaneous QHS  . insulin aspart  0-9 Units Subcutaneous TID WC  . lisinopril  20 mg Oral Daily  . metoprolol tartrate  25 mg Oral BID  . pantoprazole  40 mg Oral Daily  . sertraline  50 mg Oral QHS    Allergies:  Allergies  Allergen Reactions  . Influenza Vaccines Anaphylaxis  . Codeine Itching  . Demerol Other (See Comments)    SEIZURE  . Nsaids Other (See Comments)      NAPROXEN, IBUPROFEN, SKELAXIN---  CAUSES PALPITATIONS  . Prednisone     Caused her glucose to go up to 500 and went to ED  . Tramadol     Contraindicated with Victoza     Social History   Social History  . Marital Status: Married    Spouse Name: N/A  . Number of Children: N/A  . Years of Education: N/A   Occupational History  . Not on file.   Social History Main Topics  . Smoking status: Never Smoker   . Smokeless tobacco: Never Used  . Alcohol Use: No  . Drug Use: No  . Sexual Activity: Not on file  Other Topics Concern  . Not on file   Social History Narrative     Family History  Problem Relation Age of Onset  . Heart disease Mother     in her 4s  . Diabetes Mother   . Kidney disease Mother   . Thyroid disease Mother   . Thyroid disease Brother   . Thyroid disease Sister   . Thyroid disease Paternal Grandmother   . Heart attack Sister     at age 40      Review of Systems: All other systems reviewed and are otherwise negative except as noted above.  Physical Exam: Filed Vitals:   11/14/14 0200 11/14/14 0607 11/14/14 0859 11/14/14 0948  BP: 148/66 124/50  149/58  Pulse: 51 52  64  Temp: 97.7 F (36.5 C) 98.5 F (36.9 C)  98.4 F (36.9 C)  TempSrc: Oral Oral  Oral  Resp: 18 20  18   Height:      Weight:      SpO2: 99% 99% 98% 100%    GEN- The patient is obese appearing, alert and oriented x 3 today.   Head- normocephalic, atraumatic Eyes-  Sclera clear, conjunctiva pink Ears- hearing intact Oropharynx- clear Lungs-   normal work of breathing Heart- Regular rate and rhythm  GI- soft, NT, ND, + BS Extremities- no clubbing, cyanosis, or edema Psych- euthymic mood, full affect   Labs:   Lab Results  Component Value Date   WBC 6.5 11/11/2014   HGB 14.3 11/11/2014   HCT 45.5 11/11/2014   MCV 89.2 11/11/2014   PLT 266 11/11/2014    Recent Labs Lab 11/11/14 1558 11/14/14 0710  NA 138 137  K 4.3 3.9  CL 105 102  CO2 25 28  BUN  16 16  CREATININE 0.70 0.64  CALCIUM 8.8* 8.9  PROT 6.4*  --   BILITOT 0.4  --   ALKPHOS 71  --   ALT 13*  --   AST 20  --   GLUCOSE 89 156*    Radiology/Studies:   Mr Brain Wo Contrast 11/11/2014   CLINICAL DATA:  Intermittent tingling and left-sided facial droop. Some associated blurred vision. Symptoms began 14 hr ago and have been waxing and waning.  EXAM: MRI HEAD WITHOUT CONTRAST  TECHNIQUE: Multiplanar, multiecho pulse sequences of the brain and surrounding structures were obtained without intravenous contrast.  COMPARISON:  Head CT earlier same day.  FINDINGS: The brain has a normal appearance on all pulse sequences without evidence of malformation, atrophy, old or acute infarction, mass lesion, hemorrhage, hydrocephalus or extra-axial collection. No pituitary mass. No fluid in the sinuses, middle ears or mastoids. No skull or skullbase lesion. There is flow in the major vessels at the base of the brain. Major venous sinuses show flow.  IMPRESSION: Normal examination.   Electronically Signed   By: Nelson Chimes M.D.   On: 11/11/2014 21:04   Nm Myocar Multi W/spect W/wall Motion / Ef 11/13/2014    T wave inversion was noted during stress in the III leads.  There was no ST segment deviation noted during stress.  The study is normal.  The left ventricular ejection fraction is normal (55-65%).   Normal study, no evidence for ischemia or infarction.    12-lead ECG sinus rhythm, rate 61, non-specific STT changes All prior EKG's in EPIC reviewed with no documented atrial fibrillation  Telemetry sinus rhythm  Assessment and Plan:  1. TIA vs complex migraine  The patient presents with questionable  TIA vs complex migraine.  Discussed with neurology who does not feel that the patient had a TIA.  She does not meet our protocol for LINQ implant for cryptogenic stroke.  She has planned angiogram for cerebral aneurysm.  If she had recurrent clear TIA/strokes in the future then can consider ILR  implantation.   She is clear at this time that she would prefer to avoid ILR implantation.  Please call with questions.   Patsey Berthold, NP 11/14/2014 1:12 PM  I have seen, examined the patient, and reviewed the above assessment and plan.  On exam, RRR.  Changes to above are made where necessary.  Pt does not wish to be considered for ILR.  She does not meet cryptogenic stroke protocol per Dr Leonie Man.  No indication for EP procedures at this time  EP to see as needed.  Co Sign: Thompson Grayer, MD 11/14/2014 1:35 PM

## 2014-11-14 NOTE — Progress Notes (Signed)
Patient ID: Sherry [name], female   DOB: Apr 20, 1950, 64 y.o.   MRN: [id number]    Patient Profile: 64 year old African-American female with past medical history of hypertension, hyperlipidemia, DM, goiter, and mitral valve prolapse presented with TIA symptoms, also noted to have intermittent chest discomfort for the past month.  Subjective: No complaints. Currently CP free.   Objective: Vital signs in last 24 hours: Temp:  [97.7 F (36.5 C)-98.5 F (36.9 C)] 98.5 F (36.9 C) (10/06 0607) Pulse Rate:  [51-74] 52 (10/06 0607) Resp:  [15-20] 20 (10/06 0607) BP: (124-170)/(50-89) 124/50 mmHg (10/06 0607) SpO2:  [98 %-100 %] 98 % (10/06 0859) Last BM Date: 11/13/14  Intake/Output from previous day:   Intake/Output this shift:    Medications Current Facility-Administered Medications  Medication Dose Route Frequency Provider Last Rate Last Dose  . acetaminophen (TYLENOL) tablet 650 mg  650 mg Oral Q6H PRN Veatrice Bourbon, MD   650 mg at 11/14/14 0803  . albuterol (PROVENTIL) (2.5 MG/3ML) 0.083% nebulizer solution 3 mL  3 mL Inhalation Q6H PRN Waldemar Dickens, MD      . aspirin tablet 325 mg  325 mg Oral Daily Waldemar Dickens, MD   325 mg at 11/13/14 1114  . atorvastatin (LIPITOR) tablet 40 mg  40 mg Oral q morning - 10a Aquilla Hacker, MD   40 mg at 11/13/14 1115  . budesonide (PULMICORT) nebulizer solution 0.5 mg  0.5 mg Nebulization BID Waldemar Dickens, MD   0.5 mg at 11/14/14 0858  . docusate sodium (COLACE) capsule 100 mg  100 mg Oral Daily Waldemar Dickens, MD   100 mg at 11/13/14 1115  . furosemide (LASIX) tablet 20 mg  20 mg Oral Daily Waldemar Dickens, MD   20 mg at 11/13/14 1114  . gabapentin (NEURONTIN) capsule 300 mg  300 mg Oral BID Waldemar Dickens, MD   300 mg at 11/13/14 2220  . heparin injection 5,000 Units  5,000 Units Subcutaneous 3 times per day Waldemar Dickens, MD   Stopped at 11/13/14 2220  . insulin aspart (novoLOG) injection 0-5 Units  0-5 Units Subcutaneous QHS  Waldemar Dickens, MD   0 Units at 11/12/14 0102  . insulin aspart (novoLOG) injection 0-9 Units  0-9 Units Subcutaneous TID WC Waldemar Dickens, MD   1 Units at 11/14/14 [phone]  . lisinopril (PRINIVIL,ZESTRIL) tablet 20 mg  20 mg Oral Daily Waldemar Dickens, MD   20 mg at 11/13/14 1116  . metoprolol tartrate (LOPRESSOR) tablet 25 mg  25 mg Oral BID Waldemar Dickens, MD   25 mg at 11/13/14 2220  . pantoprazole (PROTONIX) EC tablet 40 mg  40 mg Oral Daily Waldemar Dickens, MD   40 mg at 11/13/14 1115  . senna-docusate (Senokot-S) tablet 1 tablet  1 tablet Oral QHS PRN Waldemar Dickens, MD      . sertraline (ZOLOFT) tablet 50 mg  50 mg Oral QHS Aquilla Hacker, MD   50 mg at 11/13/14 2220    PE: General appearance: alert, cooperative and no distress Neck: no carotid bruit and no JVD Lungs: clear to auscultation bilaterally Heart: regular rate and rhythm, S1, S2 normal, no murmur, click, rub or gallop Pulses: 2+ and symmetric Skin: Skin color, texture, turgor normal. No rashes or lesions Neurologic: Grossly normal  Lab Results:   Recent Labs  11/11/14 1558  WBC 6.5  HGB 14.3  HCT 45.5  PLT 266   BMET  Recent Labs  11/11/14 1558 11/14/14 0710  NA 138 137  K 4.3 3.9  CL 105 102  CO2 25 28  GLUCOSE 89 156*  BUN 16 16  CREATININE 0.70 0.64  CALCIUM 8.8* 8.9   PT/INR  Recent Labs  11/11/14 1558  LABPROT 13.7  INR 1.03   Cholesterol  Recent Labs  11/12/14 0700  CHOL 150   Cardiac Panel (last 3 results) No results for input(s): CKTOTAL, CKMB, TROPONINI, RELINDX in the last 72 hours.  Studies/Results: NST pending   Assessment/Plan  Principal Problem:   TIA (transient ischemic attack) Active Problems:   Diabetes mellitus with complication (HCC)   Essential hypertension   Dependent edema   Peripheral neuropathy (HCC)   Depression   Facial droop   Chest pain   Transient cerebral ischemia    1. Chest Pain: Atypical features r/o no ECG changes Echo with normal  EF and myovue normal and non ischemic no need for further w/u   2. TIA: per neuro. 5 mm CS aneurysm  For angio 10/7  Will ask EP to see for possible loop recorder   3. HTN: BP is well controlled on metoprolol and lisinopril.      Jenkins Rouge

## 2014-11-15 ENCOUNTER — Observation Stay (HOSPITAL_COMMUNITY): Payer: 59

## 2014-11-15 DIAGNOSIS — I10 Essential (primary) hypertension: Secondary | ICD-10-CM | POA: Diagnosis not present

## 2014-11-15 DIAGNOSIS — I729 Aneurysm of unspecified site: Secondary | ICD-10-CM

## 2014-11-15 DIAGNOSIS — E118 Type 2 diabetes mellitus with unspecified complications: Secondary | ICD-10-CM | POA: Diagnosis not present

## 2014-11-15 LAB — BASIC METABOLIC PANEL
Anion gap: 10 (ref 5–15)
BUN: 15 mg/dL (ref 6–20)
CO2: 31 mmol/L (ref 22–32)
Calcium: 9.4 mg/dL (ref 8.9–10.3)
Chloride: 99 mmol/L — ABNORMAL LOW (ref 101–111)
Creatinine, Ser: 0.7 mg/dL (ref 0.44–1.00)
GFR calc Af Amer: >60 mL/min (ref >60)
GFR calc non Af Amer: >60 mL/min (ref >60)
Glucose, Bld: 150 mg/dL — ABNORMAL HIGH (ref 65–99)
Potassium: 4.5 mmol/L (ref 3.5–5.1)
Sodium: 140 mmol/L (ref 135–145)

## 2014-11-15 LAB — CBC
HCT: 45 % (ref 36.0–46.0)
Hemoglobin: 13.9 g/dL (ref 12.0–15.0)
MCH: 27.5 pg (ref 26.0–34.0)
MCHC: 30.9 g/dL (ref 30.0–36.0)
MCV: 89.1 fL (ref 78.0–100.0)
Platelets: 231 10*3/uL (ref 150–400)
RBC: 5.05 MIL/uL (ref 3.87–5.11)
RDW: 13.9 % (ref 11.5–15.5)
WBC: 6.4 10*3/uL (ref 4.0–10.5)

## 2014-11-15 LAB — GLUCOSE, CAPILLARY
Glucose-Capillary: 141 mg/dL — ABNORMAL HIGH (ref 65–99)
Glucose-Capillary: 139 mg/dL — ABNORMAL HIGH (ref 65–99)
Glucose-Capillary: 158 mg/dL — ABNORMAL HIGH (ref 65–99)

## 2014-11-15 MED ORDER — SERTRALINE HCL 50 MG PO TABS: 50.0000 mg | ORAL_TABLET | Freq: Every day | ORAL | Status: DC

## 2014-11-15 MED ORDER — ATORVASTATIN CALCIUM 40 MG PO TABS: 40.0000 mg | ORAL_TABLET | Freq: Every morning | ORAL | Status: DC

## 2014-11-15 MED ORDER — ASPIRIN 81 MG PO TBEC: 81.0000 mg | DELAYED_RELEASE_TABLET | Freq: Every day | ORAL | Status: DC

## 2014-11-15 MED ORDER — IOHEXOL 300 MG/ML  SOLN
150.0000 mL | Freq: Once | INTRAMUSCULAR | Status: DC | PRN
  Administered 2014-11-15: 75 mL via INTRAVENOUS
  Filled 2014-11-15: qty 150

## 2014-11-15 MED ORDER — MIDAZOLAM HCL 2 MG/2ML IJ SOLN
INTRAMUSCULAR | Status: AC
  Filled 2014-11-15: qty 2

## 2014-11-15 MED ORDER — HEPARIN SODIUM (PORCINE) 1000 UNIT/ML IJ SOLN
INTRAMUSCULAR | Status: AC | PRN
  Administered 2014-11-15: 1000 [IU] via INTRAVENOUS

## 2014-11-15 MED ORDER — FENTANYL CITRATE (PF) 100 MCG/2ML IJ SOLN
INTRAMUSCULAR | Status: AC
  Filled 2014-11-15: qty 2

## 2014-11-15 MED ORDER — LIDOCAINE HCL 1 % IJ SOLN
INTRAMUSCULAR | Status: AC
  Filled 2014-11-15: qty 20

## 2014-11-15 MED ORDER — HEPARIN SODIUM (PORCINE) 1000 UNIT/ML IJ SOLN
INTRAMUSCULAR | Status: AC
  Filled 2014-11-15: qty 1

## 2014-11-15 MED ORDER — MIDAZOLAM HCL 2 MG/2ML IJ SOLN
INTRAMUSCULAR | Status: AC | PRN
  Administered 2014-11-15: 1 mg via INTRAVENOUS

## 2014-11-15 MED ORDER — FENTANYL CITRATE (PF) 100 MCG/2ML IJ SOLN
INTRAMUSCULAR | Status: AC | PRN
  Administered 2014-11-15: 25 ug via INTRAVENOUS

## 2014-11-15 MED ORDER — SODIUM CHLORIDE 0.9 % IV SOLN: INTRAVENOUS | Status: AC

## 2014-11-15 NOTE — Progress Notes (Signed)
Pt for discharge home today. Discharge orders received. IV and telemetry dcd with dressing clean dry and intact. Discharge instructions and prescriptions to be picked up at [facility]. Pt and family verbalized understanding. Family at bedside to assist with discharge. Staff brought patient to lobby via wheelchair at this time. Transported to home by family member.

## 2014-11-15 NOTE — Progress Notes (Signed)
Patient with possible discharge for today. MD ordered a 3 in 1 for home use. CM notified Jermaine with [employer] and equipment is to delivered to the room. Bedside RN updated.

## 2014-11-15 NOTE — Sedation Documentation (Signed)
Pt familiar with this kind of procedure, no questions, instructions given

## 2014-11-15 NOTE — Sedation Documentation (Signed)
Exoseal placed, tech holding pressure to groin

## 2014-11-15 NOTE — Sedation Documentation (Signed)
IR tech will place Exoseal to R groin

## 2014-11-15 NOTE — Sedation Documentation (Signed)
exoseal in place- bedrest 3 hrs- until 1540

## 2014-11-15 NOTE — Discharge Instructions (Signed)
Follow up with your Dr. Lorelei Pont within 1 week of discharge: 10/12 8am.  Return to the ED if you have symptoms like facial droop, weakness in your arms or legs, or trouble speaking.  Follow up with your endocrinologist regarding your diabetes medicines.  Stroke Prevention Some medical conditions and behaviors are associated with an increased chance of having a stroke. You may prevent a stroke by making healthy choices and managing medical conditions. HOW CAN I REDUCE MY RISK OF HAVING A STROKE?   Stay physically active. Get at least 30 minutes of activity on most or all days.  Do not smoke. It may also be helpful to avoid exposure to secondhand smoke.  Limit alcohol use. Moderate alcohol use is considered to be:  No more than 2 drinks per day for men.  No more than 1 drink per day for nonpregnant women.  Eat healthy foods. This involves:  Eating 5 or more servings of fruits and vegetables a day.  Making dietary changes that address high blood pressure (hypertension), high cholesterol, diabetes, or obesity.  Manage your cholesterol levels.  Making food choices that are high in fiber and low in saturated fat, trans fat, and cholesterol may control cholesterol levels.  Take any prescribed medicines to control cholesterol as directed by your health care provider.  Manage your diabetes.  Controlling your carbohydrate and sugar intake is recommended to manage diabetes.  Take any prescribed medicines to control diabetes as directed by your health care provider.  Control your hypertension.  Making food choices that are low in salt (sodium), saturated fat, trans fat, and cholesterol is recommended to manage hypertension.  Ask your health care provider if you need treatment to lower your blood pressure. Take any prescribed medicines to control hypertension as directed by your health care provider.  If you are 58-25 years of age, have your blood pressure checked every 3-5 years. If you  are 14 years of age or older, have your blood pressure checked every year.  Maintain a healthy weight.  Reducing calorie intake and making food choices that are low in sodium, saturated fat, trans fat, and cholesterol are recommended to manage weight.  Stop drug abuse.  Avoid taking birth control pills.  Talk to your health care provider about the risks of taking birth control pills if you are over 64 years old, smoke, get migraines, or have ever had a blood clot.  Get evaluated for sleep disorders (sleep apnea).  Talk to your health care provider about getting a sleep evaluation if you snore a lot or have excessive sleepiness.  Take medicines only as directed by your health care provider.  For some people, aspirin or blood thinners (anticoagulants) are helpful in reducing the risk of forming abnormal blood clots that can lead to stroke. If you have the irregular heart rhythm of atrial fibrillation, you should be on a blood thinner unless there is a good reason you cannot take them.  Understand all your medicine instructions.  Make sure that other conditions (such as anemia or atherosclerosis) are addressed. SEEK IMMEDIATE MEDICAL CARE IF:   You have sudden weakness or numbness of the face, arm, or leg, especially on one side of the body.  Your face or eyelid droops to one side.  You have sudden confusion.  You have trouble speaking (aphasia) or understanding.  You have sudden trouble seeing in one or both eyes.  You have sudden trouble walking.  You have dizziness.  You have a loss of balance  or coordination.  You have a sudden, severe headache with no known cause.  You have new chest pain or an irregular heartbeat. Any of these symptoms may represent a serious problem that is an emergency. Do not wait to see if the symptoms will go away. Get medical help at once. Call your local emergency services (911 in [place] Do not drive yourself to the hospital.   This information  is not intended to replace advice given to you by your health care provider. Make sure you discuss any questions you have with your health care provider.   Document Released: 03/04/2004 Document Revised: 02/15/2014 Document Reviewed: 07/28/2012 Elsevier Interactive Patient Education 2016 [employer].    Thanks for letting us take care of you!  If you have recurrence of your symptoms please return for evaluation in the ED.   Follow up at your appointments as directed.  We are glad you are feeling better!  Follow-up Information    Follow up with SETHI,PRAMOD, MD. Schedule an appointment as soon as possible for a visit in 2 months.   Specialties:  Neurology, Radiology   Why:  Call to make an appt for stroke clinic follow up   Contact information:   [place] [id number] [phone]       Follow up with Rob Hickman, MD. Go on 11/29/2014.   Specialty:  Interventional Radiology   Why:  1pm followup appt; come to main entrance of [facility] and ask for radiology   Contact information:   [address] [place] [address] [phone]       Follow up with [name], MD. Go on 11/20/2014.   Specialty:  Family Medicine   Why:  8am hospital follow up ([place])    Contact information:   [address] [place] [address] [phone]

## 2014-11-15 NOTE — Progress Notes (Signed)
Family Medicine Teaching Service Daily Progress Note Intern Pager: [phone]  Patient name: Sherry [name] Medical record number: [id number] Date of birth: 11-21-1950 Age: 64 y.o. Gender: female  Primary Care Provider: [name], MD Consultants: neurology, neuro IR, cardiology, EP Code Status: FULL  Pt Overview and Major Events to Date:  No acute events overnight; patient had mild headache that resolved with tylenol.   Assessment and Plan: TIA: Pt had a similar TIA in 2013, which also began with L sided facial droop. All symptoms resolved. Doesn't recall her most recent cardiology visit. Reports taking her ASA 325mg  "religiously." CT head NAICA, MRI normal. MRA, carotid dopplers, echo pending. EKG x 2 in ED not concerning for ischemia. Lipid panel WNL. Echo EF 60-65%. Carotid dopplers 1-39% stenosis bilaterally. 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 PT recommended no PT followup. SLP signed off, no concerns. OT rec'd home health OT and 3 in 1 bedside comode (flat shower chair versus tub bench).  - neuro interventional radiology recommends cerebral angiogram 10/7 - neurology signed off, rec'd continued ASA 81mg  - EP consulted per cards recs, pt not a candidate for loop recorder - continue ASA 81mg   2mm R cavernous ICA aneurysm: Neuro IR consulting, cerebral angiogram ordered 10/7. Pt reportedly having new headaches over the past month or so, she felt due to anxiety. 10/5 at 6:45pm, she experienced a new headache that was the worst she has ever had. It was "25/10" and required norco. She endorses double vision at that time, as well as pain in the trigeminal distribution and behind R eye. Known aneurysm found in 32mm R cavernous ICA on MRA.  - CT head without contrast 10/6 NAICA - appreciate neuro IR recs  Chest pain / angina symptoms: On initial evaluation, pt reported chest tightness with exertion, SOB, diaphoresis, and nausea x 1 month intermittently. Occurs with uncomfortable  senstation behind L scapula. I-stat trop neg here. EKG x 2 no acute changes. Lost cardiology followup several years ago when her cardiologist moved away. Myoview in 2010 was normal. Myoview exam normal. - added atorvastatin 40mg  as pt ASCVD risk is >10% - continue home ASA as above  DMII: SSI while inpatient. Pt reports being on metformin BID and repaglinide TID with meals at home per endocrinology. She reports that she got a few low CBGs, including 59 once, so she now only takes her repaglinide BID with meals.  - recommend outpatient f/u with existing endocrinologist (Dr. Loanne Drilling).  HTN: continue home lisinopril 20mg  and metoprolol 25mg  BID.   Asthma: stable per patient. She reports using proventil inhaler intermittently based on seasonal changes rather than daily.  - albuterol prn wheezing.   Hyperthyroidism: s/p radioiodine ablation 06/16. Follows with endocrine (Dr. Loanne Drilling). TSH last 0.8.   Dependent edema: continue home lasix 20mg  daily.  Peripheral neuropathy: continue home gabapentin 300mg .   GERD: continue home PPI (protonix 40mg ).   Depression / Anxiety: pt. Endorsing depression control but has some anxiety. On zoloft 25mg  and agreeable to go up on this to see if anxiety improves.   FEN/GI: PPI PPx: heparin 5,000 units  Disposition: home pending results of cerebral angiogram  Subjective:  Pt feels well this morning. She reports that the increased dose of zoloft is helping her anxiety significantly, and she feels ready to go home today if her angiogram goes well. She reports one mild headache last night around 7pm, which resolved with tylenol.   Objective: Temp:  [97.8 F (36.6 C)-98.6 F (37 C)] 98.1 F (36.7 C) (  10/07 0521) Pulse Rate:  [55-68] 56 (10/07 0521) Resp:  [17-19] 18 (10/07 0521) BP: (128-149)/(58-73) 137/70 mmHg (10/07 0521) SpO2:  [97 %-100 %] 100 % (10/07 0521) Physical Exam: General: Overweight female in NAD resting in bed and eating  breakfast Cardiovascular: RRR, no m/r/g Respiratory: CTAB Abdomen: NTND +BS Extremities: no LE edema, rashes or wounds  Laboratory:  Recent Labs Lab 11/11/14 1558 11/15/14 0527  WBC 6.5 6.4  HGB 14.3 13.9  HCT 45.5 45.0  PLT 266 231    Recent Labs Lab 11/11/14 1558 11/14/14 0710 11/15/14 0527  NA 138 137 140  K 4.3 3.9 4.5  CL 105 102 99*  CO2 25 28 31   BUN 16 16 15   CREATININE 0.70 0.64 0.70  CALCIUM 8.8* 8.9 9.4  PROT 6.4*  --   --   BILITOT 0.4  --   --   ALKPHOS 71  --   --   ALT 13*  --   --   AST 20  --   --   GLUCOSE 89 156* 150*    Imaging/Diagnostic Tests: Cerebral angiogram pending today  Sela Hilding, Med Student 11/15/2014, 8:11 AM Medical Student (pager [phone]) [facility] Intern pager: [phone], text pages welcome  I agree with the above evaluation, assessment, and plan. Any correctional changes can be noted in [facility].   Aquilla Hacker, MD Family Medicine Resident - PGY 2  O: Filed Vitals:   11/15/14 1232  BP: 137/76  Pulse: 54  Temp:   Resp: 16   Gen: Resting comfortably in bed, AAOx3 HEENT: NCAT, PERRLA, EOMI CV: RRR, No MGR, normal S1/S2 Resp: CTA Bilaterally, appropriate rate, unlabored Abd: S, NT, ND, +BS Ext: WWP, 2+ distal pulses, MAEW Neuro: No new deficits. No changes  Psych: Appropriate mood / affect.   A/P: Pt. Is a 64 y/o AAF here with TIA symptoms that have resolved. Workup for stroke negative to date, but found to have a 1mm extradural ICA aneurysm. Mild headache overnight. No further neurological symptoms. Cards have evaluated and signed off for this hospitalization. No ILR.   # TIA:  - workup unremarkable.  - MRI/ MRA with 61mm aneurysm - Neuro consulting: Recs ASA 81 mg confirmed today, no plavix - Neurointerventional Radiology consulting: recs Cerebral angiogram today.  - Continue ASA 81 mg for now.  - Lipitor - HTN / DMII control.   # Aneurysm : pt. With 16mm extradural ICA  aneurysm on MRI. Neurointerventional radiology consulting. Mild headache overnight relieved by tramadol.  - Tylenol / tramadol prn for headache.  - avoiding NSAID's at this time.  - Cerebral angiogram for today per neuro IR.  - ? Symptomatic aneurysm given location. Will await results of cerebral angiogram and recs from NeuroIR.   # Angina: Pt. With hx of typical / atypical symptoms  - Cardiology consulting appreciate recs - Myovue low risk.  - EP consulted and recs no ILR - Outpatient f/u with cards.   # Depression / Anxiety: Pt. With complaint of anxiety. Feeling better after increased dose of Zoloft.  - She says that this is related to her current medical problems / home stress - Continue Zoloft 50mg .    Paula Compton, MD Family Medicine - PGY 2

## 2014-11-15 NOTE — Procedures (Signed)
S/P 4 vessel cerebral aretriogram. RT CFA approach. Findings. 1.70  75 % stenosis of RT MCA prox. 2.Approx 58mm x 4.1 mm RT ICA  Petrous /cavernous aneurysm.

## 2014-11-20 ENCOUNTER — Other Ambulatory Visit (HOSPITAL_COMMUNITY): Payer: Self-pay | Admitting: Interventional Radiology

## 2014-11-20 ENCOUNTER — Encounter: Payer: Self-pay | Admitting: Family Medicine

## 2014-11-20 ENCOUNTER — Ambulatory Visit (INDEPENDENT_AMBULATORY_CARE_PROVIDER_SITE_OTHER): Payer: 59 | Admitting: Family Medicine

## 2014-11-20 VITALS — BP 125/79 | HR 79 | Temp 97.3°F | Resp 16 | Wt 199.6 lb

## 2014-11-20 DIAGNOSIS — G459 Transient cerebral ischemic attack, unspecified: Secondary | ICD-10-CM

## 2014-11-20 DIAGNOSIS — R0982 Postnasal drip: Secondary | ICD-10-CM

## 2014-11-20 DIAGNOSIS — J329 Chronic sinusitis, unspecified: Secondary | ICD-10-CM

## 2014-11-20 DIAGNOSIS — Z8673 Personal history of transient ischemic attack (TIA), and cerebral infarction without residual deficits: Secondary | ICD-10-CM

## 2014-11-20 MED ORDER — IPRATROPIUM BROMIDE 0.03 % NA SOLN: 2.0000 | Freq: Four times a day (QID) | NASAL | Status: DC

## 2014-11-20 NOTE — Patient Instructions (Addendum)
I will check with your eye doctor to be sure that we are safe to use the atrovent nasal spray- assuming that this is ok, I will send it to your drug store. This can be used up to 4x a day for post- nasal draiange Let me know if you have any change in your symptoms or other concerns Try to rest your brain- sleep when you feel sleepy, and rest when you need it.  Please come and see me in about 3 months for a recheck- sooner if you have any concerns

## 2014-11-20 NOTE — Progress Notes (Signed)
[facility] [address] [place] [address] [phone]  Date:  11/20/2014   Name:  Sherry [name]   DOB:  02-May-1950   MRN:  [id number]  PCP:  [name], MD    Chief Complaint: hospital follow up and Depression   History of Present Illness:  Sherry [name] is a 64 y.o. very pleasant female patient who presents with the following:  Here today for hospital follow-up. She came to clinic on 10/3 with sx of TIA, was sent to the ER.  She was admitted 10/3 to 10/7 and dx with TIA.  She also had complaint of CP.  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  Cardiology did a myoview which was negative.    She reports that she is feeling tired and has a "naggy little headache, but nothing like I had before.  No tingling, just squeezing in my head."   She is sleeping a lot, feeling tired out She also noted that she is getting "out of breath" from walking a short distance.  She has never been a smoker, we do not think that she has COPD but she has not been tested.   She also noted PND and a feeling of something caught in her throat that is bothersome She is on asmanex and has a neb machine for albuterol for history of asthma.   She is seeing Dr. Estanislado Pandy with interventional radiology about the aneurysm in about 10 days.  She notes that she still has some trouble thinking clearly and does not feel like herself. She still feels anxious and upset following this health incident- see depression screen  She is not on any medication for possible glaucoma- she sees Dr. Idolina Primer.  Checked with him; ok to use an anticholinergic agent but she is overdue to see him for a recheck. Let her know that a visit is due  Lab Results  Component Value Date   HGBA1C 7.4* 11/12/2014     Patient Active Problem List   Diagnosis Date Noted  . Aneurysm ([place])   . Headache   . Transient cerebral ischemia   .  Diabetes mellitus with complication ([place]) 40/11/2723  . Essential hypertension 11/12/2014  . Dependent edema 11/12/2014  . Peripheral neuropathy ([place]) 11/12/2014  . Depression 11/12/2014  . Facial droop   . Chest pain   . TIA (transient ischemic attack) 11/11/2014  . Dyspnea 08/06/2014  . Hyperthyroidism 08/06/2014  . Meniere's disease 06/07/2014  . History of CHF (congestive heart failure) 06/07/2014  . Essential hypertension, benign 06/13/2013  . Type II or unspecified type diabetes mellitus without mention of complication, not stated as uncontrolled 06/13/2013  . Goiter 06/13/2013  . Obesity, unspecified 06/13/2013    Past Medical History  Diagnosis Date  . Diabetes mellitus   . Hypertension   . GERD (gastroesophageal reflux disease)   . Dyslipidemia   . Mitral valve prolapse occasional palpitations w/ chest discomfort  . DJD (degenerative joint disease)   . Meniere's disease   . Rotator cuff tear, left   . History of CHF (congestive heart failure) 2010-  RESOLVED  . H/O hiatal hernia   . Complication of anesthesia HYPOTENSION INTRAOP W/ HYSTERECTOMY IN 1987--  DID OK W/ TOTAL KNEE IN 2008  . OA (osteoarthritis)   . History of peptic ulcer YRS AGO    Past Surgical History  Procedure Laterality Date  .  Total knee arthroplasty  09-12-2006  [facility]    RIGHT KNEE  . Vaginal hysterectomy  1987  . Bunionectomy  2007    LEFT FOOT  . Tympanoplasty  1990;   1980  . Left shoulder surgery  1997    ROTATOR CUFF REPAIR  . Tubal ligation  1976  . Shoulder arthroscopy  09/20/2011    Procedure: ARTHROSCOPY SHOULDER;  Surgeon: Johnn Hai, MD;  Location: [facility];  Service: Orthopedics;  Laterality: Left;  SAD AND MINI OPEN ROTATOR CUFF REPAIR      Social History  Substance Use Topics  . Smoking status: Never Smoker   . Smokeless tobacco: Never Used  . Alcohol Use: No    Family History  Problem Relation Age of Onset  . Heart disease Mother     in her  24s  . Diabetes Mother   . Kidney disease Mother   . Thyroid disease Mother   . Thyroid disease Brother   . Thyroid disease Sister   . Thyroid disease Paternal Grandmother   . Heart attack Sister     at age 8    Allergies  Allergen Reactions  . Influenza Vaccines Anaphylaxis  . Codeine Itching  . Demerol Other (See Comments)    SEIZURE  . Nsaids Other (See Comments)    NAPROXEN, IBUPROFEN, SKELAXIN---  CAUSES PALPITATIONS  . Prednisone     Caused her glucose to go up to 500 and went to ED  . Tramadol     Contraindicated with Victoza     Medication list has been reviewed and updated.  Current Outpatient Prescriptions on File Prior to Visit  Medication Sig Dispense Refill  . albuterol (PROVENTIL HFA;VENTOLIN HFA) 108 (90 BASE) MCG/ACT inhaler Inhale 2 puffs into the lungs every 6 (six) hours as needed. For shortness of breath.    Marland Kitchen albuterol (PROVENTIL) (2.5 MG/3ML) 0.083% nebulizer solution Take 3 mLs (2.5 mg total) by nebulization every 6 (six) hours as needed for wheezing or shortness of breath. 150 mL 1  . aspirin EC 81 MG EC tablet Take 1 tablet (81 mg total) by mouth daily. 30 tablet 0  . atorvastatin (LIPITOR) 40 MG tablet Take 1 tablet (40 mg total) by mouth every morning. 30 tablet 0  . cetirizine (ZYRTEC) 10 MG tablet Take 10 mg by mouth daily.    Marland Kitchen docusate sodium (COLACE) 100 MG capsule Take 100 mg by mouth daily.    . empagliflozin (JARDIANCE) 10 MG TABS tablet Take 10 mg by mouth daily. 30 tablet 11  . furosemide (LASIX) 20 MG tablet Take 1 tablet (20 mg total) by mouth daily. 30 tablet 3  . gabapentin (NEURONTIN) 300 MG capsule Take 1 capsule (300 mg total) by mouth 2 (two) times daily. 60 capsule 5  . HYDROcodone-acetaminophen (NORCO/VICODIN) 5-325 MG per tablet Take 1 tablet by mouth every 6 (six) hours as needed for moderate pain.    . Insulin Pen Needle 30G X 5 MM MISC 1 each by Does not apply route daily. 200 each 3  . lisinopril (PRINIVIL,ZESTRIL) 20 MG  tablet TAKE 1 TABLET BY MOUTH DAILY 30 tablet 4  . metFORMIN (GLUCOPHAGE) 500 MG tablet Take 2 tablets (1,000 mg total) by mouth 2 (two) times daily with a meal. PATIENT NEEDS OFFICE VISIT FOR ADDITIONAL REFILLS (Patient taking differently: Take 1,000 mg by mouth 2 (two) times daily with a meal. ) 120 tablet 0  . metoprolol tartrate (LOPRESSOR) 25 MG tablet Take 1 tablet (  25 mg total) by mouth 2 (two) times daily. 60 tablet 5  . mometasone (ASMANEX) 220 MCG/INH inhaler Use 1 puff daily followed by rinsing your mouth out with water. 1 Inhaler 12  . omeprazole (PRILOSEC) 20 MG capsule Take 20 mg by mouth.    . repaglinide (PRANDIN) 2 MG tablet Take 1 tablet (2 mg total) by mouth 3 (three) times daily before meals. 90 tablet 11  . sertraline (ZOLOFT) 50 MG tablet Take 1 tablet (50 mg total) by mouth at bedtime. 30 tablet 0   No current facility-administered medications on file prior to visit.    Review of Systems:  As per HPI- otherwise negative.   Physical Examination: Filed Vitals:   11/20/14 0758  BP: 125/79  Pulse: 79  Temp: 97.3 F (36.3 C)  Resp: 16   Filed Vitals:   11/20/14 0758  Weight: 199 lb 9.6 oz (90.538 kg)   Body mass index is 36.5 kg/(m^2). Ideal Body Weight:    GEN: WDWN, NAD, Non-toxic, A & O x 3, overweight, looks well HEENT: Atraumatic, Normocephalic. Neck supple. No masses, No LAD. Ears and Nose: No external deformity. CV: RRR, No M/G/R. No JVD. No thrill. No extra heart sounds. PULM: CTA B, no wheezes, crackles, rhonchi. No retractions. No resp. distress. No accessory muscle use. ABD: S, NT, ND, +BS. No rebound. No HSM. EXTR: No c/c/e NEURO Normal gait.  PSYCH: Normally interactive. Conversant. Not depressed or anxious appearing.  Calm demeanor.    Assessment and Plan: Hx-TIA (transient ischemic attack)  Post-nasal drainage - Plan: ipratropium (ATROVENT) 0.03 % nasal spray  Suspect that her sx of sadness and fatigue are due to recent TIA- follow-up  if not better Encouraged rest She will follow-up with IR and neuro as planned atrovent nasal as needed  Signed [name], MD

## 2014-11-29 ENCOUNTER — Ambulatory Visit (HOSPITAL_COMMUNITY): Payer: 59

## 2014-12-02 ENCOUNTER — Encounter: Payer: Self-pay | Admitting: Endocrinology

## 2014-12-02 ENCOUNTER — Ambulatory Visit (INDEPENDENT_AMBULATORY_CARE_PROVIDER_SITE_OTHER): Payer: 59 | Admitting: Endocrinology

## 2014-12-02 VITALS — BP 120/74 | HR 62 | Temp 98.4°F | Ht 62.5 in | Wt 199.0 lb

## 2014-12-02 DIAGNOSIS — E118 Type 2 diabetes mellitus with unspecified complications: Secondary | ICD-10-CM | POA: Diagnosis not present

## 2014-12-02 LAB — TSH: TSH: 0.23 u[IU]/mL — ABNORMAL LOW (ref 0.35–4.50)

## 2014-12-02 LAB — T4, FREE: Free T4: 1.19 ng/dL (ref 0.60–1.60)

## 2014-12-02 MED ORDER — METFORMIN HCL 500 MG PO TABS: 1000.0000 mg | ORAL_TABLET | Freq: Two times a day (BID) | ORAL | Status: DC

## 2014-12-02 MED ORDER — REPAGLINIDE 2 MG PO TABS: 2.0000 mg | ORAL_TABLET | Freq: Two times a day (BID) | ORAL | Status: DC

## 2014-12-02 MED ORDER — LINAGLIPTIN 5 MG PO TABS
5.0000 mg | ORAL_TABLET | Freq: Every day | ORAL | Status: DC
Start: 2014-12-02 — End: 2015-01-13

## 2014-12-02 NOTE — Patient Instructions (Addendum)
Please reduce the repaglinide to twice a day (breakfast and supper).  i have sent a prescription to your pharmacy, to add "tradjenta." check your blood sugar once a day.  vary the time of day when you check, between before the 3 meals, and at bedtime.  also check if you have symptoms of your blood sugar being too high or too low.  please keep a record of the readings and bring it to your next appointment here.  You can write it on any piece of paper.  please call us sooner if your blood sugar goes below 70, or if you have a lot of readings over 200.   blood tests are requested for you today.  We'll let you know about the results.   Please come back for a follow-up appointment in 6 weeks.

## 2014-12-02 NOTE — Progress Notes (Signed)
Subjective:    Patient ID: Sherry [name], female    DOB: 09-06-1950, 64 y.o.   MRN: [id number]  HPI Pt returns for f/u of diabetes mellitus: DM type: 2 Dx'ed: 4268 Complications: painful polyneuropathy and TIA. Therapy: 3 oral meds.  GDM: never.  DKA: never. Severe hypoglycemia: never. Pancreatitis: never. Other: she has never been on insulin; she did not tolerate victoza (sob) Interval history: no cbg record, but states cbg's vary from 47-187.  It is lowest in the early afternoon.  She is recovering from a recent TIA.   She had I-131 for hyperthyroidism, 7 weeks ago.   Past Medical History  Diagnosis Date  . Diabetes mellitus   . Hypertension   . GERD (gastroesophageal reflux disease)   . Dyslipidemia   . Mitral valve prolapse occasional palpitations w/ chest discomfort  . DJD (degenerative joint disease)   . Meniere's disease   . Rotator cuff tear, left   . History of CHF (congestive heart failure) 2010-  RESOLVED  . H/O hiatal hernia   . Complication of anesthesia HYPOTENSION INTRAOP W/ HYSTERECTOMY IN 1987--  DID OK W/ TOTAL KNEE IN 2008  . OA (osteoarthritis)   . History of peptic ulcer YRS AGO    Past Surgical History  Procedure Laterality Date  . Total knee arthroplasty  09-12-2006  [facility]    RIGHT KNEE  . Vaginal hysterectomy  1987  . Bunionectomy  2007    LEFT FOOT  . Tympanoplasty  1990;   1980  . Left shoulder surgery  1997    ROTATOR CUFF REPAIR  . Tubal ligation  1976  . Shoulder arthroscopy  09/20/2011    Procedure: ARTHROSCOPY SHOULDER;  Surgeon: Johnn Hai, MD;  Location: [facility];  Service: Orthopedics;  Laterality: Left;  SAD AND MINI OPEN ROTATOR CUFF REPAIR      Social History   Social History  . Marital Status: Married    Spouse Name: N/A  . Number of Children: N/A  . Years of Education: N/A   Occupational History  . Not on file.   Social History Main Topics  . Smoking status: Never Smoker   . Smokeless  tobacco: Never Used  . Alcohol Use: No  . Drug Use: No  . Sexual Activity: Not on file   Other Topics Concern  . Not on file   Social History Narrative    Current Outpatient Prescriptions on File Prior to Visit  Medication Sig Dispense Refill  . albuterol (PROVENTIL HFA;VENTOLIN HFA) 108 (90 BASE) MCG/ACT inhaler Inhale 2 puffs into the lungs every 6 (six) hours as needed. For shortness of breath.    Marland Kitchen albuterol (PROVENTIL) (2.5 MG/3ML) 0.083% nebulizer solution Take 3 mLs (2.5 mg total) by nebulization every 6 (six) hours as needed for wheezing or shortness of breath. 150 mL 1  . aspirin EC 81 MG EC tablet Take 1 tablet (81 mg total) by mouth daily. 30 tablet 0  . atorvastatin (LIPITOR) 40 MG tablet Take 1 tablet (40 mg total) by mouth every morning. 30 tablet 0  . cetirizine (ZYRTEC) 10 MG tablet Take 10 mg by mouth daily.    Marland Kitchen docusate sodium (COLACE) 100 MG capsule Take 100 mg by mouth daily.    . empagliflozin (JARDIANCE) 10 MG TABS tablet Take 10 mg by mouth daily. 30 tablet 11  . furosemide (LASIX) 20 MG tablet Take 1 tablet (20 mg total) by mouth daily. 30 tablet 3  . gabapentin (NEURONTIN) 300  MG capsule Take 1 capsule (300 mg total) by mouth 2 (two) times daily. 60 capsule 5  . HYDROcodone-acetaminophen (NORCO/VICODIN) 5-325 MG per tablet Take 1 tablet by mouth every 6 (six) hours as needed for moderate pain.    . Insulin Pen [name] 30G X 5 MM MISC 1 each by Does not apply route daily. 200 each 3  . ipratropium (ATROVENT) 0.03 % nasal spray Place 2 sprays into the nose 4 (four) times daily. 30 mL 6  . lisinopril (PRINIVIL,ZESTRIL) 20 MG tablet TAKE 1 TABLET BY MOUTH DAILY 30 tablet 4  . metoprolol tartrate (LOPRESSOR) 25 MG tablet Take 1 tablet (25 mg total) by mouth 2 (two) times daily. 60 tablet 5  . mometasone (ASMANEX) 220 MCG/INH inhaler Use 1 puff daily followed by rinsing your mouth out with water. 1 Inhaler 12  . omeprazole (PRILOSEC) 20 MG capsule Take 20 mg by mouth.     . sertraline (ZOLOFT) 50 MG tablet Take 1 tablet (50 mg total) by mouth at bedtime. 30 tablet 0   No current facility-administered medications on file prior to visit.    Allergies  Allergen Reactions  . Influenza Vaccines Anaphylaxis  . Codeine Itching  . Demerol Other (See Comments)    SEIZURE  . Nsaids Other (See Comments)    NAPROXEN, IBUPROFEN, SKELAXIN---  CAUSES PALPITATIONS  . Prednisone     Caused her glucose to go up to 500 and went to ED  . Tramadol     Contraindicated with Victoza     Family History  Problem Relation Age of Onset  . Heart disease Mother     in her 63s  . Diabetes Mother   . Kidney disease Mother   . Thyroid disease Mother   . Thyroid disease Brother   . Thyroid disease Sister   . Thyroid disease Paternal Grandmother   . Heart attack Sister     at age 78    BP 120/74 mmHg  Pulse 62  Temp(Src) 98.4 F (36.9 C) (Oral)  Ht 5' 2.5" (1.588 m)  Wt 199 lb (90.266 kg)  BMI 35.80 kg/m2  SpO2 97%  Review of Systems Denies weight change    Objective:   Physical Exam VITAL SIGNS:  See vs page GENERAL: no distress Pulses: dorsalis pedis intact bilat.   MSK: no deformity of the feet CV: no leg edema Skin:  no ulcer on the feet.  normal color and temp on the feet. Neuro: sensation is intact to touch on the feet   Lab Results  Component Value Date   CREATININE 0.70 11/15/2014   BUN 15 11/15/2014   NA 140 11/15/2014   K 4.5 11/15/2014   CL 99* 11/15/2014   CO2 31 11/15/2014   Lab Results  Component Value Date   HGBA1C 7.4* 11/12/2014   Lab Results  Component Value Date   TSH 0.23* 12/02/2014      Assessment & Plan:  DM: she needs increased rx TIA, recent.  In this context, she should avoid hypoglycemia.   Hyperthyroidism, improved after I-131 rx.     Patient is advised the following: Patient Instructions  Please reduce the repaglinide to twice a day (breakfast and supper).  i have sent a prescription to your pharmacy, to  add "tradjenta." check your blood sugar once a day.  vary the time of day when you check, between before the 3 meals, and at bedtime.  also check if you have symptoms of your blood sugar being too  high or too low.  please keep a record of the readings and bring it to your next appointment here.  You can write it on any piece of paper.  please call us sooner if your blood sugar goes below 70, or if you have a lot of readings over 200.   blood tests are requested for you today.  We'll let you know about the results.   Please come back for a follow-up appointment in 6 weeks.

## 2014-12-04 ENCOUNTER — Other Ambulatory Visit: Payer: Self-pay | Admitting: Family Medicine

## 2014-12-09 ENCOUNTER — Ambulatory Visit (HOSPITAL_COMMUNITY)
Admission: RE | Admit: 2014-12-09 | Discharge: 2014-12-09 | Disposition: A | Discharge status: Home / Self Care | Payer: 59 | Source: Ambulatory Visit | Attending: Interventional Radiology | Admitting: Interventional Radiology

## 2014-12-09 DIAGNOSIS — G459 Transient cerebral ischemic attack, unspecified: Secondary | ICD-10-CM

## 2014-12-13 ENCOUNTER — Other Ambulatory Visit: Payer: Self-pay | Admitting: Family Medicine

## 2014-12-25 ENCOUNTER — Other Ambulatory Visit: Payer: Self-pay | Admitting: Endocrinology

## 2015-01-13 ENCOUNTER — Encounter: Payer: Self-pay | Admitting: Endocrinology

## 2015-01-13 ENCOUNTER — Ambulatory Visit (INDEPENDENT_AMBULATORY_CARE_PROVIDER_SITE_OTHER): Payer: 59 | Admitting: Endocrinology

## 2015-01-13 VITALS — BP 136/82 | HR 73 | Temp 98.4°F | Ht 62.5 in | Wt 205.0 lb

## 2015-01-13 DIAGNOSIS — E118 Type 2 diabetes mellitus with unspecified complications: Secondary | ICD-10-CM

## 2015-01-13 DIAGNOSIS — E0590 Thyrotoxicosis, unspecified without thyrotoxic crisis or storm: Secondary | ICD-10-CM | POA: Diagnosis not present

## 2015-01-13 LAB — POCT GLYCOSYLATED HEMOGLOBIN (HGB A1C): Hemoglobin A1C: 6.6

## 2015-01-13 LAB — TSH: TSH: 1.08 u[IU]/mL (ref 0.35–4.50)

## 2015-01-13 LAB — T4, FREE: Free T4: 0.74 ng/dL (ref 0.60–1.60)

## 2015-01-13 MED ORDER — LINAGLIPTIN 5 MG PO TABS: 2.5000 mg | ORAL_TABLET | Freq: Every day | ORAL | Status: DC

## 2015-01-13 MED ORDER — EMPAGLIFLOZIN 10 MG PO TABS: 5.0000 mg | ORAL_TABLET | Freq: Every day | ORAL | Status: DC

## 2015-01-13 MED ORDER — ATORVASTATIN CALCIUM 80 MG PO TABS: 40.0000 mg | ORAL_TABLET | Freq: Every day | ORAL | Status: DC

## 2015-01-13 NOTE — Patient Instructions (Addendum)
Please reduce tradjenta to 1/2 pill per day In January, resume the jardiance, but take just 1/2 pill per day.  check your blood sugar once a day.  vary the time of day when you check, between before the 3 meals, and at bedtime.  also check if you have symptoms of your blood sugar being too high or too low.  please keep a record of the readings and bring it to your next appointment here.  You can write it on any piece of paper.  please call us sooner if your blood sugar goes below 70, or if you have a lot of readings over 200.   blood tests are requested for you today.  We'll let you know about the results.  Please come back for a follow-up appointment in 6 weeks.

## 2015-01-13 NOTE — Progress Notes (Signed)
Subjective:    Patient ID: Sherry [name], female    DOB: 03-17-1950, 64 y.o.   MRN: [id number]  HPI Pt returns for f/u of diabetes mellitus: DM type: 2 Dx'ed: [id number] Complications: painful polyneuropathy and TIA. Therapy: 3 oral meds.  GDM: never.  DKA: never. Severe hypoglycemia: never. Pancreatitis: never. Other: she has never been on insulin; she did not tolerate victoza (sob) Interval history: She has not taken jardiance in 1 month, due to cost.  she brings a record of her cbg's which i have reviewed today.  It varies from 64-300, but most are approx 100.   She had I-131 for hyperthyroidism in mid-2016.   She also stopped lipitor, due to cost Past Medical History  Diagnosis Date  . Diabetes mellitus   . Hypertension   . GERD (gastroesophageal reflux disease)   . Dyslipidemia   . Mitral valve prolapse occasional palpitations w/ chest discomfort  . DJD (degenerative joint disease)   . Meniere's disease   . Rotator cuff tear, left   . History of CHF (congestive heart failure) 2010-  RESOLVED  . H/O hiatal hernia   . Complication of anesthesia HYPOTENSION INTRAOP W/ HYSTERECTOMY IN 1987--  DID OK W/ TOTAL KNEE IN 2008  . OA (osteoarthritis)   . History of peptic ulcer YRS AGO    Past Surgical History  Procedure Laterality Date  . Total knee arthroplasty  09-12-2006  [facility]    RIGHT KNEE  . Vaginal hysterectomy  1987  . Bunionectomy  2007    LEFT FOOT  . Tympanoplasty  1990;   1980  . Left shoulder surgery  1997    ROTATOR CUFF REPAIR  . Tubal ligation  1976  . Shoulder arthroscopy  09/20/2011    Procedure: ARTHROSCOPY SHOULDER;  Surgeon: Johnn Hai, MD;  Location: [facility];  Service: Orthopedics;  Laterality: Left;  SAD AND MINI OPEN ROTATOR CUFF REPAIR      Social History   Social History  . Marital Status: Married    Spouse Name: N/A  . Number of Children: N/A  . Years of Education: N/A   Occupational History  . Not on file.    Social History Main Topics  . Smoking status: Never Smoker   . Smokeless tobacco: Never Used  . Alcohol Use: No  . Drug Use: No  . Sexual Activity: Not on file   Other Topics Concern  . Not on file   Social History Narrative    Current Outpatient Prescriptions on File Prior to Visit  Medication Sig Dispense Refill  . albuterol (PROVENTIL HFA;VENTOLIN HFA) 108 (90 BASE) MCG/ACT inhaler Inhale 2 puffs into the lungs every 6 (six) hours as needed. For shortness of breath.    Marland Kitchen albuterol (PROVENTIL) (2.5 MG/3ML) 0.083% nebulizer solution Take 3 mLs (2.5 mg total) by nebulization every 6 (six) hours as needed for wheezing or shortness of breath. 150 mL 1  . aspirin EC 81 MG EC tablet Take 1 tablet (81 mg total) by mouth daily. 30 tablet 0  . cetirizine (ZYRTEC) 10 MG tablet Take 10 mg by mouth daily.    Marland Kitchen docusate sodium (COLACE) 100 MG capsule Take 100 mg by mouth daily.    . furosemide (LASIX) 20 MG tablet TAKE 1 TABLET(20 MG) BY MOUTH DAILY 30 tablet 0  . gabapentin (NEURONTIN) 300 MG capsule Take 1 capsule (300 mg total) by mouth 2 (two) times daily. 60 capsule 5  . HYDROcodone-acetaminophen (NORCO/VICODIN) 5-325 MG per  tablet Take 1 tablet by mouth every 6 (six) hours as needed for moderate pain.    . Insulin Pen [name] 30G X 5 MM MISC 1 each by Does not apply route daily. 200 each 3  . ipratropium (ATROVENT) 0.03 % nasal spray Place 2 sprays into the nose 4 (four) times daily. 30 mL 6  . lisinopril (PRINIVIL,ZESTRIL) 20 MG tablet TAKE 1 TABLET BY MOUTH DAILY 30 tablet 0  . metFORMIN (GLUCOPHAGE) 500 MG tablet TAKE 2 TABLETS(1000 MG) BY MOUTH TWICE DAILY WITH A MEAL 120 tablet 0  . metoprolol tartrate (LOPRESSOR) 25 MG tablet Take 1 tablet (25 mg total) by mouth 2 (two) times daily. 60 tablet 5  . mometasone (ASMANEX) 220 MCG/INH inhaler Use 1 puff daily followed by rinsing your mouth out with water. 1 Inhaler 12  . omeprazole (PRILOSEC) 20 MG capsule Take 20 mg by mouth.    .  repaglinide (PRANDIN) 2 MG tablet Take 1 tablet (2 mg total) by mouth 2 (two) times daily before a meal. 90 tablet 11  . sertraline (ZOLOFT) 50 MG tablet Take 1 tablet (50 mg total) by mouth at bedtime. 30 tablet 0   No current facility-administered medications on file prior to visit.    Allergies  Allergen Reactions  . Influenza Vaccines Anaphylaxis  . Codeine Itching  . Demerol Other (See Comments)    SEIZURE  . Nsaids Other (See Comments)    NAPROXEN, IBUPROFEN, SKELAXIN---  CAUSES PALPITATIONS  . Prednisone     Caused her glucose to go up to 500 and went to ED  . Tramadol     Contraindicated with Victoza     Family History  Problem Relation Age of Onset  . Heart disease Mother     in her 16s  . Diabetes Mother   . Kidney disease Mother   . Thyroid disease Mother   . Thyroid disease Brother   . Thyroid disease Sister   . Thyroid disease Paternal Grandmother   . Heart attack Sister     at age 38    BP 136/82 mmHg  Pulse 73  Temp(Src) 98.4 F (36.9 C) (Oral)  Ht 5' 2.5" (1.588 m)  Wt 205 lb (92.987 kg)  BMI 36.87 kg/m2  SpO2 97%  Review of Systems Denies LOC    Objective:   Physical Exam VITAL SIGNS:  See vs page GENERAL: no distress Pulses: dorsalis pedis intact bilat.   MSK: no deformity of the feet CV: no leg edema Skin:  no ulcer on the feet.  normal color and temp on the feet. Neuro: sensation is intact to touch on the feet.   A1c=6.4% Lab Results  Component Value Date   TSH 1.08 01/13/2015      Assessment & Plan:  Hyperthyroidism: resolved with I-131 rx DM: well-controlled, but therapy limited by pt's request for less expensive meds. Dyslipidemia: therapy limited by cost considerations.    Patient is advised the following: Patient Instructions  Please reduce tradjenta to 1/2 pill per day In January, resume the jardiance, but take just 1/2 pill per day.  check your blood sugar once a day.  vary the time of day when you check, between  before the 3 meals, and at bedtime.  also check if you have symptoms of your blood sugar being too high or too low.  please keep a record of the readings and bring it to your next appointment here.  You can write it on any piece of paper.  please call us sooner if your blood sugar goes below 70, or if you have a lot of readings over 200.   blood tests are requested for you today.  We'll let you know about the results.  Please come back for a follow-up appointment in 6 weeks.

## 2015-01-22 ENCOUNTER — Encounter: Payer: Self-pay | Admitting: Neurology

## 2015-01-22 ENCOUNTER — Ambulatory Visit (INDEPENDENT_AMBULATORY_CARE_PROVIDER_SITE_OTHER): Payer: 59 | Admitting: Neurology

## 2015-01-22 VITALS — BP 134/68 | HR 67 | Ht 62.0 in | Wt 206.8 lb

## 2015-01-22 DIAGNOSIS — E785 Hyperlipidemia, unspecified: Secondary | ICD-10-CM | POA: Diagnosis not present

## 2015-01-22 DIAGNOSIS — I729 Aneurysm of unspecified site: Secondary | ICD-10-CM

## 2015-01-22 DIAGNOSIS — G451 Carotid artery syndrome (hemispheric): Secondary | ICD-10-CM

## 2015-01-22 DIAGNOSIS — I679 Cerebrovascular disease, unspecified: Secondary | ICD-10-CM | POA: Diagnosis not present

## 2015-01-22 DIAGNOSIS — E1159 Type 2 diabetes mellitus with other circulatory complications: Secondary | ICD-10-CM | POA: Diagnosis not present

## 2015-01-22 DIAGNOSIS — I10 Essential (primary) hypertension: Secondary | ICD-10-CM

## 2015-01-22 DIAGNOSIS — F411 Generalized anxiety disorder: Secondary | ICD-10-CM | POA: Diagnosis not present

## 2015-01-22 MED ORDER — ASPIRIN 81 MG PO TBEC: 325.0000 mg | DELAYED_RELEASE_TABLET | Freq: Every day | ORAL | Status: DC

## 2015-01-22 NOTE — Progress Notes (Signed)
STROKE NEUROLOGY FOLLOW UP NOTE  NAME: Sherry [name] DOB: 07-12-1950  REASON FOR VISIT: stroke follow up HISTORY FROM: pt and chart  Today we had the pleasure of seeing Sherry [name] in follow-up at our Neurology Clinic. Pt was accompanied by no one.   History Summary Sherry [name] is a 64 y.o. female with history of HTN, DM, HLD, MVP admitted on 11/11/14 for complaints of facial droop, decreased sensation left face as well as stabbing sensation in her left shoulder and arm pit after an argument with husband. She appeared to be anxious on exam. 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 Stroke work up with TTE and CUS unremarkable. LDL 78 and A1C 7.4. She was considered possible TIA due to multiple risk factors but also not to exclude from anxiety as the etiology. She was discharged with ASA and lipitor.   Interval History During the interval time, the patient has been doing well. No recurrent stroke like symptoms. Had seen Dr. 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 Pt optioned conservative treatment towards aneurysm so far and plan for another CTA in 6 months. Her glucose at home from 51-139 and BP in good control. Today BP 134/68.    REVIEW OF SYSTEMS: Full 14 system review of systems performed and notable only for those listed below and in HPI above, all others are negative:  Constitutional:  fatigue Cardiovascular:  Ear/Nose/Throat:   Skin:  Eyes:  Blurry vision Respiratory:  SOB Gastroitestinal:   Genitourinary:  Hematology/Lymphatic:   Endocrine:  Musculoskeletal:  Joint pain Allergy/Immunology:   Neurological:  Memory loss, confusion, HA Psychiatric: depression Sleep:   The following represents the patient's updated allergies and side effects list: Allergies  Allergen Reactions  . Influenza Vaccines Anaphylaxis  . Codeine  Itching  . Demerol Other (See Comments)    SEIZURE  . Nsaids Other (See Comments)    NAPROXEN, IBUPROFEN, SKELAXIN---  CAUSES PALPITATIONS  . Prednisone     Caused her glucose to go up to 500 and went to ED  . Tramadol     Contraindicated with Victoza     The neurologically relevant items on the patient's problem list were reviewed on today's visit.  Neurologic Examination  A problem focused neurological exam (12 or more points of the single system neurologic examination, vital signs counts as 1 point, cranial nerves count for 8 points) was performed.  Blood pressure 134/68, pulse 67, height 5\' 2"  (1.575 m), weight 206 lb 12.8 oz (93.804 kg).  General - obses, well developed, in no apparent distress.  Ophthalmologic - Fundi not visualized due to eye movement.  Cardiovascular - Regular rate and rhythm.  Mental Status -  Level of arousal and orientation to time, place, and person were intact. Language including expression, naming, repetition, comprehension was assessed and found intact. Fund of Knowledge was assessed and was intact.  Cranial Nerves II - XII - II - Visual field intact OU. III, IV, VI - Extraocular movements intact. V - Facial sensation intact bilaterally. VII - Facial movement intact bilaterally. VIII - Hearing & vestibular intact bilaterally. X - Palate elevates symmetrically. XI - Chin turning & shoulder shrug intact bilaterally. XII - Tongue protrusion intact.  Motor Strength - The patient's strength was normal in all extremities and pronator drift was absent.  Bulk was normal and fasciculations were absent.   Motor Tone -  Muscle tone was assessed at the neck and appendages and was normal.  Reflexes - The patient's reflexes were 1+ in all extremities and she had no pathological reflexes.  Sensory - Light touch, temperature/pinprick were assessed and were normal.    Coordination - The patient had normal movements in the hands and feet with no ataxia or  dysmetria.  Tremor was absent.  Gait and Station - broad based gait due to obesity.  Data reviewed: I personally reviewed the images and agree with the radiology interpretations.  Ct Head Wo Contrast 11/11/2014 Stable head CT. No acute intracranial findings or explanation for the patient's symptoms.   Mr Brain Wo Contrast 11/11/2014 Normal examination.   Mr Jodene Nam Head/brain Wo Cm 11/12/2014 1. Mild branch vessel atherosclerotic changes without evidence of medium or large vessel occlusion or significant proximal stenosis. 2. 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   Carotid Doppler There is 1-39% bilateral ICA stenosis. Vertebral artery flow is antegrade.   2D Echocardiogram  - Left ventricle: The cavity size was normal. Systolic function was normal. The estimated ejection fraction was in the range of 60%to 65%. Wall motion was normal; there were no regional wallmotion abnormalities. There was no evidence of elevatedventricular filling pressure by Doppler parameters. - Mitral valve: Calcified annulus. Impressions: No cardiac source of emboli was indentified.  Cerebral angio 11/18/14 -  Approximately 75% stenosis of the right middle cerebral artery M1 segment with mild post stenotic dilatation. Approximately 7 x 4.1 mm saccular wide neck aneurysm arising from the caval cavernous segment of the right internal carotid artery.  Component     Latest Ref Rng 11/12/2014 12/02/2014 01/13/2015  Cholesterol     0 - 200 mg/dL 150    Triglycerides     <150 mg/dL 66    HDL Cholesterol     >40 mg/dL 59    Total CHOL/HDL Ratio      2.5    VLDL     0 - 40 mg/dL 13    LDL (calc)     0 - 99 mg/dL 78    Hemoglobin A1C     4.8 - 5.6 % 7.4 (H)    Mean Plasma Glucose      166    Free T4     0.60 - 1.60 ng/dL  1.19 0.74  TSH     0.35 - 4.50 uIU/mL  0.23 (L) 1.08     Assessment: As you may recall, she is a 64 y.o. African American female with PMH of HTN,  DM, HLD, MVP admitted on 11/11/14 for complaints of facial droop, decreased sensation left face as well as stabbing sensation in her left shoulder and arm pit after an argument with husband. MRI showed no acute infarct and MRA showed 50mm right cavernous ICA aneurysm. Stroke work up with TTE and CUS unremarkable. LDL 78 and A1C 7.4. She was considered possible TIA due to multiple risk factors but also not to exclude from anxiety as the etiology. She was discharged with ASA and lipitor. During the interval time, she saw Dr. Estanislado Pandy for aneurysm and cerebral angio showed right M1 75% stenosis and right 7x36mm saccular aneurysm at right ICA cavernous segment. Pt optioned conservative treatment towards aneurysm so far and plan for another CTA in 6 months. DM and HTN under control.    Plan:  - switch from ASA 81mg  back to ASA 325mg  - continue lipitor for stroke prevention - continue to follow up with Dr. Estanislado Pandy  for brain aneurysm - Follow up with your primary care physician for stroke risk factor modification. Recommend maintain blood pressure goal <130/80, diabetes with hemoglobin A1c goal below 6.5% and lipids with LDL cholesterol goal below 70 mg/dL.  - check BP and glucose and record and bring over to PCP for medication adjustment if needed - follow up in 4 months.  I spent more than 25 minutes of face to face time with the patient. Greater than 50% of time was spent in counseling and coordination of care. We have discussed about aneurysm monitoring, and stroke risk factor modification.   No orders of the defined types were placed in this encounter.    Meds ordered this encounter  Medications  . aspirin 81 MG EC tablet    Sig: Take 4 tablets (325 mg total) by mouth daily.    Dispense:  30 tablet    Refill:  0    Patient Instructions  - agree to switch from ASA 81mg  back to ASA 325mg  - continue lipitor for stroke prevention - continue to follow up with Dr. Estanislado Pandy for brain aneurysm -  Follow up with your primary care physician for stroke risk factor modification. Recommend maintain blood pressure goal <130/80, diabetes with hemoglobin A1c goal below 6.5% and lipids with LDL cholesterol goal below 70 mg/dL.  - check BP and glucose and record and bring over to PCP for medication adjustment if needed - follow up in 4 months.    Rosalin Hawking, MD PhD [facility] [address] [place], [place] [address] [phone]

## 2015-01-22 NOTE — Patient Instructions (Addendum)
-   agree to switch from ASA 81mg  back to ASA 325mg  - continue lipitor for stroke prevention - continue to follow up with Dr. Estanislado Pandy for brain aneurysm - Follow up with your primary care physician for stroke risk factor modification. Recommend maintain blood pressure goal <130/80, diabetes with hemoglobin A1c goal below 6.5% and lipids with LDL cholesterol goal below 70 mg/dL.  - check BP and glucose and record and bring over to PCP for medication adjustment if needed - follow up in 4 months.

## 2015-02-14 ENCOUNTER — Telehealth: Payer: Self-pay | Admitting: Endocrinology

## 2015-02-14 NOTE — Telephone Encounter (Signed)
please call patient: i got form.  Please move up appt to week of 02/17/15.

## 2015-02-17 NOTE — Telephone Encounter (Signed)
Left a voicemail advising of note below and requested a call back from he pt to discuss.

## 2015-02-18 ENCOUNTER — Ambulatory Visit (INDEPENDENT_AMBULATORY_CARE_PROVIDER_SITE_OTHER): Payer: Self-pay | Admitting: Family Medicine

## 2015-02-18 ENCOUNTER — Ambulatory Visit (INDEPENDENT_AMBULATORY_CARE_PROVIDER_SITE_OTHER): Payer: Self-pay

## 2015-02-18 VITALS — BP 130/76 | HR 73 | Temp 97.8°F | Resp 20 | Ht 62.0 in | Wt 206.4 lb

## 2015-02-18 DIAGNOSIS — Z7185 Encounter for immunization safety counseling: Secondary | ICD-10-CM

## 2015-02-18 DIAGNOSIS — G8929 Other chronic pain: Secondary | ICD-10-CM

## 2015-02-18 DIAGNOSIS — M25551 Pain in right hip: Secondary | ICD-10-CM

## 2015-02-18 DIAGNOSIS — Z7189 Other specified counseling: Secondary | ICD-10-CM

## 2015-02-18 MED ORDER — HYDROCODONE-ACETAMINOPHEN 5-325 MG PO TABS: 1.0000 | ORAL_TABLET | Freq: Four times a day (QID) | ORAL | Status: DC | PRN

## 2015-02-18 MED ORDER — PREDNISONE 20 MG PO TABS: ORAL_TABLET | ORAL | Status: DC

## 2015-02-18 NOTE — Progress Notes (Signed)
   Subjective:    Patient ID: Sherry [name], female    DOB: [birth year], 65 y.o.   MRN: [id number] By signing my name below, I, Sherry [name], attest that this documentation has been prepared under the direction and in the presence of Robyn Haber, MD.  Electronically Signed: Zola [name], Medical Scribe. 02/18/2015. 2:18 PM.  HPI HPI Comments: Sherry [name] is a 65 y.o. female with a history of aneurysm, intracranial vascular stenosis, and TIA who presents to the [facility] for a TB test for work. 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   Patient also reports having right hip pain for the past few months. She has had difficulty ambulating, sitting up, lying down, and movement of the hip due to the pain. She has been alternating ibuprofen and Tylenol for the pain.   Review of Systems  Musculoskeletal: Positive for arthralgias.       Objective:   Physical Exam CONSTITUTIONAL: Well developed/well nourished HEAD: Normocephalic/atraumatic EYES: EOM/PERRL ENMT: Mucous membranes moist NECK: supple no meningeal signs SPINE: entire spine nontender CV: S1/S2 noted, no murmurs/rubs/gallops noted LUNGS: Lungs are clear to auscultation bilaterally, no apparent distress ABDOMEN: soft, nontender, no rebound or guarding GU: no cva tenderness NEURO: Pt is awake/alert, moves all extremitiesx4 EXTREMITIES: pulses normal. Pain moving the hip in any direction. SKIN: warm, color normal PSYCH: no abnormalities of mood noted  [facility] (PRIMARY) x-ray report read by Dr. Joseph Art: Right Hip - Moderate arthritic changes in the right hip.      Assessment & Plan:   This chart was scribed in my presence and reviewed by me personally.    ICD-9-CM ICD-10-CM   1. Hip pain, chronic, right 719.45 M25.551 DG HIP UNILAT W OR W/O PELVIS 2-3 VIEWS RIGHT   338.29 G89.29 Ambulatory referral to Orthopedic Surgery     HYDROcodone-acetaminophen (NORCO/VICODIN) 5-325 MG tablet   DISCONTINUED: predniSONE (DELTASONE) 20 MG tablet  2. Immunization counseling V65.49 Z71.89 TB Skin Test     Signed, Robyn Haber, MD

## 2015-02-18 NOTE — Progress Notes (Signed)

## 2015-02-19 ENCOUNTER — Ambulatory Visit: Payer: 59 | Admitting: Family Medicine

## 2015-02-24 ENCOUNTER — Ambulatory Visit: Payer: Self-pay | Admitting: Endocrinology

## 2015-02-26 ENCOUNTER — Ambulatory Visit: Payer: Self-pay | Admitting: Family Medicine

## 2015-04-14 ENCOUNTER — Emergency Department (HOSPITAL_BASED_OUTPATIENT_CLINIC_OR_DEPARTMENT_OTHER): Payer: Self-pay

## 2015-04-14 ENCOUNTER — Encounter (HOSPITAL_BASED_OUTPATIENT_CLINIC_OR_DEPARTMENT_OTHER): Payer: Self-pay

## 2015-04-14 ENCOUNTER — Observation Stay (HOSPITAL_BASED_OUTPATIENT_CLINIC_OR_DEPARTMENT_OTHER)
Admission: EM | Admit: 2015-04-14 | Discharge: 2015-04-16 | Disposition: A | Discharge status: Home / Self Care | Payer: Self-pay | Attending: Internal Medicine | Admitting: Internal Medicine

## 2015-04-14 DIAGNOSIS — E119 Type 2 diabetes mellitus without complications: Secondary | ICD-10-CM | POA: Insufficient documentation

## 2015-04-14 DIAGNOSIS — I10 Essential (primary) hypertension: Secondary | ICD-10-CM | POA: Diagnosis present

## 2015-04-14 DIAGNOSIS — G459 Transient cerebral ischemic attack, unspecified: Principal | ICD-10-CM

## 2015-04-14 DIAGNOSIS — I729 Aneurysm of unspecified site: Secondary | ICD-10-CM | POA: Diagnosis present

## 2015-04-14 DIAGNOSIS — R42 Dizziness and giddiness: Secondary | ICD-10-CM

## 2015-04-14 DIAGNOSIS — E118 Type 2 diabetes mellitus with unspecified complications: Secondary | ICD-10-CM

## 2015-04-14 HISTORY — DX: Cerebral aneurysm, nonruptured: I67.1

## 2015-04-14 LAB — COMPREHENSIVE METABOLIC PANEL
ALT: 22 U/L (ref 14–54)
AST: 18 U/L (ref 15–41)
Albumin: 4.2 g/dL (ref 3.5–5.0)
Alkaline Phosphatase: 85 U/L (ref 38–126)
Anion gap: 12 (ref 5–15)
BUN: 17 mg/dL (ref 6–20)
CO2: 26 mmol/L (ref 22–32)
Calcium: 9 mg/dL (ref 8.9–10.3)
Chloride: 104 mmol/L (ref 101–111)
Creatinine, Ser: 0.75 mg/dL (ref 0.44–1.00)
GFR calc Af Amer: >60 mL/min (ref >60)
GFR calc non Af Amer: >60 mL/min (ref >60)
Glucose, Bld: 293 mg/dL — ABNORMAL HIGH (ref 65–99)
Potassium: 3.9 mmol/L (ref 3.5–5.1)
Sodium: 142 mmol/L (ref 135–145)
Total Bilirubin: 0.9 mg/dL (ref 0.3–1.2)
Total Protein: 6.9 g/dL (ref 6.5–8.1)

## 2015-04-14 LAB — URINALYSIS, ROUTINE W REFLEX MICROSCOPIC
Bilirubin Urine: NEGATIVE
Glucose, UA: >1000 mg/dL — AB
Hgb urine dipstick: NEGATIVE
Ketones, ur: NEGATIVE mg/dL
Leukocytes, UA: NEGATIVE
Nitrite: NEGATIVE
Protein, ur: NEGATIVE mg/dL
Specific Gravity, Urine: 1.015 (ref 1.005–1.030)
pH: 5.5 (ref 5.0–8.0)

## 2015-04-14 LAB — CBC
HCT: 41.8 % (ref 36.0–46.0)
HCT: 40.1 % (ref 36.0–46.0)
Hemoglobin: 13.5 g/dL (ref 12.0–15.0)
Hemoglobin: 13 g/dL (ref 12.0–15.0)
MCH: 28.7 pg (ref 26.0–34.0)
MCH: 29.1 pg (ref 26.0–34.0)
MCHC: 32.3 g/dL (ref 30.0–36.0)
MCHC: 32.4 g/dL (ref 30.0–36.0)
MCV: 88.9 fL (ref 78.0–100.0)
MCV: 89.7 fL (ref 78.0–100.0)
Platelets: 268 10*3/uL (ref 150–400)
Platelets: 269 10*3/uL (ref 150–400)
RBC: 4.7 MIL/uL (ref 3.87–5.11)
RBC: 4.47 MIL/uL (ref 3.87–5.11)
RDW: 13.7 % (ref 11.5–15.5)
RDW: 14.2 % (ref 11.5–15.5)
WBC: 7.8 10*3/uL (ref 4.0–10.5)
WBC: 7.6 10*3/uL (ref 4.0–10.5)

## 2015-04-14 LAB — CBG MONITORING, ED
Glucose-Capillary: 274 mg/dL — ABNORMAL HIGH (ref 65–99)
Glucose-Capillary: 263 mg/dL — ABNORMAL HIGH (ref 65–99)
Glucose-Capillary: 190 mg/dL — ABNORMAL HIGH (ref 65–99)

## 2015-04-14 LAB — DIFFERENTIAL
Basophils Absolute: 0 10*3/uL (ref 0.0–0.1)
Basophils Relative: 1 %
Eosinophils Absolute: 0.1 10*3/uL (ref 0.0–0.7)
Eosinophils Relative: 1 %
Lymphocytes Relative: 37 %
Lymphs Abs: 2.9 10*3/uL (ref 0.7–4.0)
Monocytes Absolute: 0.4 10*3/uL (ref 0.1–1.0)
Monocytes Relative: 5 %
Neutro Abs: 4.3 10*3/uL (ref 1.7–7.7)
Neutrophils Relative %: 56 %

## 2015-04-14 LAB — RAPID URINE DRUG SCREEN, HOSP PERFORMED
Amphetamines: NOT DETECTED
Barbiturates: NOT DETECTED
Benzodiazepines: NOT DETECTED
Cocaine: NOT DETECTED
Opiates: NOT DETECTED
Tetrahydrocannabinol: NOT DETECTED

## 2015-04-14 LAB — URINE MICROSCOPIC-ADD ON
RBC / HPF: NONE SEEN RBC/hpf (ref 0–5)
Squamous Epithelial / LPF: NONE SEEN
WBC, UA: NONE SEEN WBC/hpf (ref 0–5)

## 2015-04-14 LAB — CREATININE, SERUM
Creatinine, Ser: 0.76 mg/dL (ref 0.44–1.00)
GFR calc Af Amer: >60 mL/min (ref >60)
GFR calc non Af Amer: >60 mL/min (ref >60)

## 2015-04-14 LAB — ETHANOL: Alcohol, Ethyl (B): <5 mg/dL (ref <5)

## 2015-04-14 LAB — PROTIME-INR
INR: 0.96 (ref 0.00–1.49)
Prothrombin Time: 13 seconds (ref 11.6–15.2)

## 2015-04-14 LAB — APTT: aPTT: 31 seconds (ref 24–37)

## 2015-04-14 MED ORDER — DOCUSATE SODIUM 100 MG PO CAPS
100.0000 mg | ORAL_CAPSULE | Freq: Every day | ORAL | Status: DC
  Administered 2015-04-15 – 2015-04-16 (×2): 100 mg via ORAL
  Filled 2015-04-14 (×2): qty 1

## 2015-04-14 MED ORDER — INSULIN ASPART 100 UNIT/ML ~~LOC~~ SOLN
0.0000 [IU] | Freq: Three times a day (TID) | SUBCUTANEOUS | Status: DC
  Administered 2015-04-15 (×2): 7 [IU] via SUBCUTANEOUS

## 2015-04-14 MED ORDER — PANTOPRAZOLE SODIUM 40 MG PO TBEC
40.0000 mg | DELAYED_RELEASE_TABLET | Freq: Every day | ORAL | Status: DC
Start: 2015-04-15 — End: 2015-04-16
  Administered 2015-04-15 – 2015-04-16 (×2): 40 mg via ORAL
  Filled 2015-04-14 (×2): qty 1

## 2015-04-14 MED ORDER — GABAPENTIN 300 MG PO CAPS
300.0000 mg | ORAL_CAPSULE | Freq: Two times a day (BID) | ORAL | Status: DC
  Administered 2015-04-15 (×2): 300 mg via ORAL
  Filled 2015-04-14 (×2): qty 1

## 2015-04-14 MED ORDER — ASPIRIN 325 MG PO TABS
325.0000 mg | ORAL_TABLET | Freq: Every day | ORAL | Status: DC
  Administered 2015-04-14 – 2015-04-16 (×3): 325 mg via ORAL
  Filled 2015-04-14 (×3): qty 1

## 2015-04-14 MED ORDER — CANAGLIFLOZIN 100 MG PO TABS
100.0000 mg | ORAL_TABLET | Freq: Every day | ORAL | Status: DC
  Administered 2015-04-15 – 2015-04-16 (×2): 100 mg via ORAL
  Filled 2015-04-14 (×3): qty 1

## 2015-04-14 MED ORDER — SERTRALINE HCL 50 MG PO TABS
50.0000 mg | ORAL_TABLET | Freq: Every day | ORAL | Status: DC
  Administered 2015-04-15 (×2): 50 mg via ORAL
  Filled 2015-04-14 (×2): qty 1

## 2015-04-14 MED ORDER — SODIUM CHLORIDE 0.9 % IV SOLN
INTRAVENOUS | Status: DC
  Administered 2015-04-14: 22:00:00 via INTRAVENOUS

## 2015-04-14 MED ORDER — STROKE: EARLY STAGES OF RECOVERY BOOK
Freq: Once | Status: AC
  Administered 2015-04-15: 10:00:00
  Filled 2015-04-14 (×2): qty 1

## 2015-04-14 MED ORDER — METOPROLOL TARTRATE 25 MG PO TABS
25.0000 mg | ORAL_TABLET | Freq: Two times a day (BID) | ORAL | Status: DC
  Administered 2015-04-14 – 2015-04-16 (×4): 25 mg via ORAL
  Filled 2015-04-14 (×4): qty 1

## 2015-04-14 MED ORDER — SENNOSIDES-DOCUSATE SODIUM 8.6-50 MG PO TABS
1.0000 | ORAL_TABLET | Freq: Every evening | ORAL | Status: DC | PRN
  Administered 2015-04-15: 1 via ORAL
  Filled 2015-04-14: qty 1

## 2015-04-14 MED ORDER — ASPIRIN 300 MG RE SUPP
300.0000 mg | Freq: Every day | RECTAL | Status: DC
  Filled 2015-04-14 (×4): qty 1

## 2015-04-14 MED ORDER — ALBUTEROL SULFATE (2.5 MG/3ML) 0.083% IN NEBU
2.5000 mg | INHALATION_SOLUTION | Freq: Four times a day (QID) | RESPIRATORY_TRACT | Status: DC | PRN
Start: 2015-04-14 — End: 2015-04-16

## 2015-04-14 MED ORDER — IPRATROPIUM BROMIDE 0.06 % NA SOLN
2.0000 | Freq: Four times a day (QID) | NASAL | Status: DC
  Administered 2015-04-14 – 2015-04-16 (×6): 2 via NASAL
  Filled 2015-04-14: qty 15

## 2015-04-14 MED ORDER — BUDESONIDE 0.25 MG/2ML IN SUSP
0.2500 mg | Freq: Two times a day (BID) | RESPIRATORY_TRACT | Status: DC
  Administered 2015-04-15 – 2015-04-16 (×3): 0.25 mg via RESPIRATORY_TRACT
  Filled 2015-04-14 (×3): qty 2

## 2015-04-14 MED ORDER — ENOXAPARIN SODIUM 40 MG/0.4ML ~~LOC~~ SOLN
40.0000 mg | SUBCUTANEOUS | Status: DC
  Administered 2015-04-14 – 2015-04-15 (×2): 40 mg via SUBCUTANEOUS
  Filled 2015-04-14 (×2): qty 0.4

## 2015-04-14 MED ORDER — ALBUTEROL SULFATE HFA 108 (90 BASE) MCG/ACT IN AERS: 2.0000 | INHALATION_SPRAY | Freq: Four times a day (QID) | RESPIRATORY_TRACT | Status: DC | PRN

## 2015-04-14 MED ORDER — HYDROCODONE-ACETAMINOPHEN 5-325 MG PO TABS
1.0000 | ORAL_TABLET | Freq: Four times a day (QID) | ORAL | Status: DC | PRN
  Administered 2015-04-15 (×2): 1 via ORAL
  Filled 2015-04-14 (×2): qty 1

## 2015-04-14 MED ORDER — LISINOPRIL 20 MG PO TABS
20.0000 mg | ORAL_TABLET | Freq: Every day | ORAL | Status: DC
  Administered 2015-04-15 – 2015-04-16 (×2): 20 mg via ORAL
  Filled 2015-04-14 (×2): qty 1

## 2015-04-14 MED ORDER — REPAGLINIDE 1 MG PO TABS
2.0000 mg | ORAL_TABLET | Freq: Two times a day (BID) | ORAL | Status: DC
  Administered 2015-04-15 – 2015-04-16 (×3): 2 mg via ORAL
  Filled 2015-04-14 (×6): qty 2

## 2015-04-14 MED ORDER — ATORVASTATIN CALCIUM 40 MG PO TABS
40.0000 mg | ORAL_TABLET | Freq: Every day | ORAL | Status: DC
  Administered 2015-04-15 – 2015-04-16 (×2): 40 mg via ORAL
  Filled 2015-04-14 (×2): qty 1

## 2015-04-14 MED ORDER — LORATADINE 10 MG PO TABS
10.0000 mg | ORAL_TABLET | Freq: Every day | ORAL | Status: DC
  Administered 2015-04-15 – 2015-04-16 (×2): 10 mg via ORAL
  Filled 2015-04-14 (×2): qty 1

## 2015-04-14 NOTE — Progress Notes (Signed)
Admission note:  Arrival Method: [facility] from [facility] via [facility] Orientation: A&OX4 Telemetry: [phone] CCMD notified Assessment: See flowsheet Skin: Dry; intact IV: R wrist  Pain: Denies Tubes: N/A Safety Measures: Bed alarm activated, nonskid socks placed, call light and all other personal items within reach Fall Prevention Safety Plan:Reviewed  Admission Screening:Completed 6700 Orientation: Patient has been oriented to the unit, staff and to the room.  Orders have been reviewed and implemented. Admitting paged. Will continue to assess and monitor pt.   Gavin Potters

## 2015-04-14 NOTE — H&P (Signed)
[facility] History and Physical  Sherry [name] [id number] DOB: 12-25-1950 DOA: 04/14/2015  Referring physician: Patient was transferred from [facility] [place]. PCP: Carmie Kanner, NP  Specialists: None.  Chief Complaint: Left facial numbness.  HPI: Sherry [name] is a 65 y.o. 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 Patient denies any weakness of the extremities or difficulty swallowing speaking or any visual symptoms. Patient's left facial numbness improved by the time patient reached ER. Patient is a poor historian. CT of the head did not show anything acute and patient has been admitted for possible TIA. Patient states his symptoms of left eye drooping worsens when her sugar goes up.   Review of Systems: As presented in the history of presenting illness, rest negative.  Past Medical History  Diagnosis Date  . Diabetes mellitus   . Hypertension   . GERD (gastroesophageal reflux disease)   . Dyslipidemia   . Mitral valve prolapse occasional palpitations w/ chest discomfort  . DJD (degenerative joint disease)   . Meniere's disease   . Rotator cuff tear, left   . History of CHF (congestive heart failure) 2010-  RESOLVED  . H/O hiatal hernia   . Complication of anesthesia HYPOTENSION INTRAOP W/ HYSTERECTOMY IN 1987--  DID OK W/ TOTAL KNEE IN 2008  . OA (osteoarthritis)   . History of peptic ulcer YRS AGO  . Stroke ([place])   . Cerebral aneurysm    Past Surgical History  Procedure Laterality Date  . Total knee arthroplasty  09-12-2006  [facility]    RIGHT KNEE  . Vaginal hysterectomy  1987  . Bunionectomy  2007    LEFT FOOT  . Tympanoplasty  1990;   1980  . Left shoulder surgery  1997    ROTATOR CUFF  REPAIR  . Tubal ligation  1976  . Shoulder arthroscopy  09/20/2011    Procedure: ARTHROSCOPY SHOULDER;  Surgeon: Johnn Hai, MD;  Location: [facility];  Service: Orthopedics;  Laterality: Left;  SAD AND MINI OPEN ROTATOR CUFF REPAIR     Social History:  reports that she has never smoked. She has never used smokeless tobacco. She reports that she does not drink alcohol or use illicit drugs. Where does patient live Home. Can patient participate in ADLs? Yes.  Allergies  Allergen Reactions  . Influenza Vaccines Anaphylaxis  . Codeine Itching  . Demerol Other (See Comments)    SEIZURE  . Nsaids Other (See Comments)    NAPROXEN, IBUPROFEN, SKELAXIN---  CAUSES PALPITATIONS  . Prednisone     Caused her glucose to go up to 500 and went to ED  . Tramadol     Contraindicated with Victoza     Family History:  Family History  Problem Relation Age of Onset  . Heart disease Mother     in her 13s  . Diabetes Mother   . Kidney disease Mother   . Thyroid disease Mother   . Aneurysm Mother   . Ovarian cancer Mother   . Thyroid disease Brother   . Thyroid disease Sister   . Heart disease Sister   . Thyroid disease Paternal Grandmother   . Heart attack Sister     at age 37  . Lymphoma  Father       Prior to Admission medications   Medication Sig Start Date End Date Taking? Authorizing Provider  albuterol (PROVENTIL HFA;VENTOLIN HFA) 108 (90 BASE) MCG/ACT inhaler Inhale 2 puffs into the lungs every 6 (six) hours as needed. For shortness of breath.    Historical Provider, MD  albuterol (PROVENTIL) (2.5 MG/3ML) 0.083% nebulizer solution Take 3 mLs (2.5 mg total) by nebulization every 6 (six) hours as needed for wheezing or shortness of breath. 06/24/14   Darlyne Russian, MD  aspirin 81 MG EC tablet Take 4 tablets (325 mg total) by mouth daily. 01/22/15   Rosalin Hawking, MD  atorvastatin (LIPITOR) 80 MG tablet Take 0.5 tablets (40 mg total) by mouth daily. 01/13/15   Renato Shin,  MD  cetirizine (ZYRTEC) 10 MG tablet Take 10 mg by mouth daily.    Historical Provider, MD  docusate sodium (COLACE) 100 MG capsule Take 100 mg by mouth daily.    Historical Provider, MD  empagliflozin (JARDIANCE) 10 MG TABS tablet Take 5 mg by mouth daily. 01/13/15   Renato Shin, MD  furosemide (LASIX) 20 MG tablet TAKE 1 TABLET(20 MG) BY MOUTH DAILY 12/25/14   Renato Shin, MD  gabapentin (NEURONTIN) 300 MG capsule Take 1 capsule (300 mg total) by mouth 2 (two) times daily. 07/10/14   Darreld Mclean, MD  HYDROcodone-acetaminophen (NORCO/VICODIN) 5-325 MG tablet Take 1 tablet by mouth every 6 (six) hours as needed for moderate pain. 02/18/15   Robyn Haber, MD  ipratropium (ATROVENT) 0.03 % nasal spray Place 2 sprays into the nose 4 (four) times daily. 11/20/14   Gay Filler Copland, MD  linagliptin (TRADJENTA) 5 MG TABS tablet Take 0.5 tablets (2.5 mg total) by mouth daily. Patient not taking: Reported on 02/18/2015 01/13/15   Renato Shin, MD  lisinopril (PRINIVIL,ZESTRIL) 20 MG tablet TAKE 1 TABLET BY MOUTH DAILY 12/25/14   Renato Shin, MD  metFORMIN (GLUCOPHAGE) 500 MG tablet TAKE 2 TABLETS(1000 MG) BY MOUTH TWICE DAILY WITH A MEAL 12/25/14   Renato Shin, MD  metoprolol tartrate (LOPRESSOR) 25 MG tablet Take 1 tablet (25 mg total) by mouth 2 (two) times daily. 07/10/14   Gay Filler Copland, MD  mometasone (ASMANEX) 220 MCG/INH inhaler Use 1 puff daily followed by rinsing your mouth out with water. 06/24/14   Darlyne Russian, MD  omeprazole (PRILOSEC) 20 MG capsule Take 20 mg by mouth.    Historical Provider, MD  repaglinide (PRANDIN) 2 MG tablet Take 1 tablet (2 mg total) by mouth 2 (two) times daily before a meal. 12/02/14   Renato Shin, MD  sertraline (ZOLOFT) 50 MG tablet Take 1 tablet (50 mg total) by mouth at bedtime. 11/15/14   Aquilla Hacker, MD    Physical Exam: Filed Vitals:   04/14/15 1630 04/14/15 1700 04/14/15 1728 04/14/15 1840  BP: 118/96 124/100 136/75 125/62  Pulse: 63 67 65  66  Temp:    97.9 F (36.6 C)  TempSrc:    Oral  Resp: 14 19 18 20   Height:    5\' 2"  (1.575 m)  Weight:    197 lb 5 oz (89.5 kg)  SpO2: 100% 98% 100% 100%     General:  Moderately built and nourished.  Eyes: Anicteric no pallor.  ENT: No discharge from the ears eyes nose or mouth.  Neck: No mass felt.  Cardiovascular: S1 and S2 heard.  Respiratory: No rhonchi or crepitations.  Abdomen: Soft nontender bowel sounds present.  Skin: No rash.  Musculoskeletal: No edema.  Psychiatric: Appears normal.  Neurologic: Alert awake oriented to time place and person. Moves all extremities 5 x 5. No facial asymmetry. Tongue is midline. PERRLA positive. No ptosis.  Labs on Admission:  Basic Metabolic Panel:  Recent Labs Lab 04/14/15 1205  NA 142  K 3.9  CL 104  CO2 26  GLUCOSE 293*  BUN 17  CREATININE 0.75  CALCIUM 9.0   Liver Function Tests:  Recent Labs Lab 04/14/15 1205  AST 18  ALT 22  ALKPHOS 85  BILITOT 0.9  PROT 6.9  ALBUMIN 4.2   No results for input(s): LIPASE, AMYLASE in the last 168 hours. No results for input(s): AMMONIA in the last 168 hours. CBC:  Recent Labs Lab 04/14/15 1205  WBC 7.8  NEUTROABS 4.3  HGB 13.5  HCT 41.8  MCV 88.9  PLT 268   Cardiac Enzymes: No results for input(s): CKTOTAL, CKMB, CKMBINDEX, TROPONINI in the last 168 hours.  BNP (last 3 results) No results for input(s): BNP in the last 8760 hours.  ProBNP (last 3 results) No results for input(s): PROBNP in the last 8760 hours.  CBG:  Recent Labs Lab 04/14/15 1137 04/14/15 1238 04/14/15 1705  GLUCAP 274* 263* 190*    Radiological Exams on Admission: Ct Head Wo Contrast  04/14/2015  CLINICAL DATA:  Patient with dizziness. Left facial weakness. History of prior stroke. EXAM: CT HEAD WITHOUT CONTRAST TECHNIQUE: Contiguous axial images were obtained from the base of the skull through the vertex without intravenous contrast. COMPARISON:  Brain CT 11/14/2014  FINDINGS: Ventricles and sulci are appropriate for patient's age. No evidence for acute cortically based infarct, intracranial hemorrhage, mass lesion or mass-effect. Orbits are unremarkable. Paranasal sinuses are well aerated. Mastoid air cells unremarkable. Calvarium is intact. IMPRESSION: No acute intracranial process. Electronically Signed   By: Lovey Newcomer M.D.   On: 04/14/2015 12:50    EKG: Independently reviewed. Normal sinus rhythm.  Assessment/Plan Principal Problem:   TIA (transient ischemic attack) Active Problems:   Diabetes mellitus with complication (HCC)   Essential hypertension   Aneurysm (HCC)   Vertigo   1. TIA - patient on exam appears nonfocal. I have requested neurology consult. Patient will be placed on neuro checks on her evaluation. Check MRI/MRA brain 2-D echo carotid Doppler. Aspirin. 2. Diabetes mellitus type 2 uncontrolled - check hemoglobin A1c. Patient's metformin will be on hold while inpatient. Continue Jardiance, Prandin with sliding scale coverage. 3. Hypertension - continue lisinopril and metoprolol. 4. History of brain aneurysm - follow MRA brain. 5. Hyperlipidemia on statins.   DVT Prophylaxis Lovenox.  Code Status: Full code.  Family Communication: Discussed with patient.  Disposition Plan: Admit for observation.    Sherry [name] N. [facility] Pager [phone].  If 7PM-7AM, please contact night-coverage [email or web address] Password [facility] 04/14/2015, 9:36 PM

## 2015-04-14 NOTE — ED Notes (Signed)
Patient dizzy upon ambulation to the restroom with nurse stand by assist.

## 2015-04-14 NOTE — ED Notes (Signed)
Correction: BP 124/87

## 2015-04-14 NOTE — Progress Notes (Signed)
[facility] transfer to [facility].  65 year old female with history of diabetes, HTN, GERD, HLD, Mnire's disease, , CHF, PUD, previous CVA presenting with strokelike symptoms, specifically L facial droop and dizziness. CT without evidence of stroke. Of note with previous CVA patient had imaging that showed R MCA aneurysm. Patient's last known normal was night prior to presentation to the ED. Orthostatics negative. Romberg positive. No other focal Hilda Blades appreciated at this time. Patient being admitted for TIA workup. Telemetry bed under observation requested. Neuro will need to be consult upon arrival.  Linna Darner, MD [place] 04/14/2015, 2:05 PM

## 2015-04-14 NOTE — ED Notes (Signed)
Patient leaves with [facility] at this time. Patient A&Ox4 at time of discharge. In NAD.

## 2015-04-14 NOTE — ED Notes (Signed)
Pt reports woke this morning with dizziness and feeling weaker on L side of face.  Reports hx of cerebral aneurysm and states this is same symptoms she had with previous stroke.

## 2015-04-14 NOTE — ED Provider Notes (Signed)
CSN: [id number]     Arrival date & time 04/14/15  1126 History  By signing my name below, I, Sherry [name], attest that this documentation has been prepared under the direction and in the presence of Sherry Essex, MD. Electronically Signed: Sonum [name], Education administrator. 04/14/2015. 12:04 PM.    Chief Complaint  Patient presents with  . Dizziness   The history is provided by the patient. No language interpreter was used.     HPI Comments: Sherry [name] is a 65 y.o. female with past medical history of cerebral aneurysm, HTN, DM, dyslipidemia who presents to the Emergency Department complaining of a frontal HA and dizziness that began after waking this morning. Patient poorly describes her symptoms stating that the dizziness feels like being off balance and difficulty walking; states she has room spinning sensations upon standing. She states her HA has been intermittent but is getting worse.  She has associated generalized weakness, sensation of left eye drooping, and nausea. She states with her prior CVA she had facial drooping and "felt funny". She denies numbness, extremity weakness, CP, SOB.   Past Medical History  Diagnosis Date  . Diabetes mellitus   . Hypertension   . GERD (gastroesophageal reflux disease)   . Dyslipidemia   . Mitral valve prolapse occasional palpitations w/ chest discomfort  . DJD (degenerative joint disease)   . Meniere's disease   . Rotator cuff tear, left   . History of CHF (congestive heart failure) 2010-  RESOLVED  . H/O hiatal hernia   . Complication of anesthesia HYPOTENSION INTRAOP W/ HYSTERECTOMY IN 1987--  DID OK W/ TOTAL KNEE IN 2008  . OA (osteoarthritis)   . History of peptic ulcer YRS AGO  . Stroke ([place])   . Cerebral aneurysm    Past Surgical History  Procedure Laterality Date  . Total knee arthroplasty  09-12-2006  [facility]    RIGHT KNEE  . Vaginal hysterectomy  1987  . Bunionectomy  2007    LEFT FOOT  . Tympanoplasty  1990;   1980  . Left shoulder  surgery  1997    ROTATOR CUFF REPAIR  . Tubal ligation  1976  . Shoulder arthroscopy  09/20/2011    Procedure: ARTHROSCOPY SHOULDER;  Surgeon: Johnn Hai, MD;  Location: [facility];  Service: Orthopedics;  Laterality: Left;  SAD AND MINI OPEN ROTATOR CUFF REPAIR     Family History  Problem Relation Age of Onset  . Heart disease Mother     in her 45s  . Diabetes Mother   . Kidney disease Mother   . Thyroid disease Mother   . Aneurysm Mother   . Ovarian cancer Mother   . Thyroid disease Brother   . Thyroid disease Sister   . Heart disease Sister   . Thyroid disease Paternal Grandmother   . Heart attack Sister     at age 3  . Lymphoma Father    Social History  Substance Use Topics  . Smoking status: Never Smoker   . Smokeless tobacco: Never Used  . Alcohol Use: No   OB History    No data available     Review of Systems  A complete 10 system review of systems was obtained and all systems are negative except as noted in the HPI and PMH.    Allergies  Influenza vaccines; Codeine; Demerol; Nsaids; Prednisone; and Tramadol  Home Medications   Prior to Admission medications   Medication Sig Start Date End Date Taking? Authorizing Provider  albuterol (PROVENTIL HFA;VENTOLIN HFA) 108 (90 BASE) MCG/ACT inhaler Inhale 2 puffs into the lungs every 6 (six) hours as needed. For shortness of breath.    Historical Provider, MD  albuterol (PROVENTIL) (2.5 MG/3ML) 0.083% nebulizer solution Take 3 mLs (2.5 mg total) by nebulization every 6 (six) hours as needed for wheezing or shortness of breath. 06/24/14   Darlyne Russian, MD  aspirin 81 MG EC tablet Take 4 tablets (325 mg total) by mouth daily. 01/22/15   Rosalin Hawking, MD  atorvastatin (LIPITOR) 80 MG tablet Take 0.5 tablets (40 mg total) by mouth daily. 01/13/15   Renato Shin, MD  cetirizine (ZYRTEC) 10 MG tablet Take 10 mg by mouth daily.    Historical Provider, MD  docusate sodium (COLACE) 100 MG capsule Take 100  mg by mouth daily.    Historical Provider, MD  empagliflozin (JARDIANCE) 10 MG TABS tablet Take 5 mg by mouth daily. 01/13/15   Renato Shin, MD  furosemide (LASIX) 20 MG tablet TAKE 1 TABLET(20 MG) BY MOUTH DAILY 12/25/14   Renato Shin, MD  gabapentin (NEURONTIN) 300 MG capsule Take 1 capsule (300 mg total) by mouth 2 (two) times daily. 07/10/14   Darreld Mclean, MD  HYDROcodone-acetaminophen (NORCO/VICODIN) 5-325 MG tablet Take 1 tablet by mouth every 6 (six) hours as needed for moderate pain. 02/18/15   Robyn Haber, MD  ipratropium (ATROVENT) 0.03 % nasal spray Place 2 sprays into the nose 4 (four) times daily. 11/20/14   Gay Filler Copland, MD  linagliptin (TRADJENTA) 5 MG TABS tablet Take 0.5 tablets (2.5 mg total) by mouth daily. Patient not taking: Reported on 02/18/2015 01/13/15   Renato Shin, MD  lisinopril (PRINIVIL,ZESTRIL) 20 MG tablet TAKE 1 TABLET BY MOUTH DAILY 12/25/14   Renato Shin, MD  metFORMIN (GLUCOPHAGE) 500 MG tablet TAKE 2 TABLETS(1000 MG) BY MOUTH TWICE DAILY WITH A MEAL 12/25/14   Renato Shin, MD  metoprolol tartrate (LOPRESSOR) 25 MG tablet Take 1 tablet (25 mg total) by mouth 2 (two) times daily. 07/10/14   Gay Filler Copland, MD  mometasone (ASMANEX) 220 MCG/INH inhaler Use 1 puff daily followed by rinsing your mouth out with water. 06/24/14   Darlyne Russian, MD  omeprazole (PRILOSEC) 20 MG capsule Take 20 mg by mouth.    Historical Provider, MD  repaglinide (PRANDIN) 2 MG tablet Take 1 tablet (2 mg total) by mouth 2 (two) times daily before a meal. 12/02/14   Renato Shin, MD  sertraline (ZOLOFT) 50 MG tablet Take 1 tablet (50 mg total) by mouth at bedtime. 11/15/14   York Ram Melancon, MD   BP 133/77 mmHg  Pulse 58  Temp(Src) 99.2 F (37.3 C) (Oral)  Resp 18  Wt 206 lb (93.441 kg)  SpO2 100% Physical Exam  Constitutional: She is oriented to person, place, and time. She appears well-developed and well-nourished. No distress.  HENT:  Head: Normocephalic and  atraumatic.  Mouth/Throat: Oropharynx is clear and moist. No oropharyngeal exudate.  Eyes: Conjunctivae and EOM are normal. Pupils are equal, round, and reactive to light.  No nystagmus  Neck: Normal range of motion. Neck supple.  No meningismus.  Cardiovascular: Normal rate, regular rhythm, normal heart sounds and intact distal pulses.   No murmur heard. Pulmonary/Chest: Effort normal and breath sounds normal. No respiratory distress.  Abdominal: Soft. There is no tenderness. There is no rebound and no guarding.  Musculoskeletal: Normal range of motion. She exhibits no edema or tenderness.  Neurological: She is alert and oriented  to person, place, and time. No cranial nerve deficit. She exhibits normal muscle tone. Coordination normal.  No ataxia on finger to nose bilaterally. No pronator drift. 5/5 strength throughout. CN 2-12 intact.Equal grip strength. Sensation intact. Positive Romberg, gait not tested.   Skin: Skin is warm.  Psychiatric: She has a normal mood and affect. Her behavior is normal.  Nursing note and vitals reviewed.   ED Course  Procedures (including critical care time)  DIAGNOSTIC STUDIES: Oxygen Saturation is 99% on RA, normal by my interpretation.    COORDINATION OF CARE: 12:18 PM Discussed treatment plan with pt at bedside and pt agreed to plan.   Labs Review Labs Reviewed  COMPREHENSIVE METABOLIC PANEL - Abnormal; Notable for the following:    Glucose, Bld 293 (*)    All other components within normal limits  URINALYSIS, ROUTINE W REFLEX MICROSCOPIC (NOT AT [facility]) - Abnormal; Notable for the following:    Glucose, UA >1000 (*)    All other components within normal limits  URINE MICROSCOPIC-ADD ON - Abnormal; Notable for the following:    Bacteria, UA RARE (*)    All other components within normal limits  CBG MONITORING, ED - Abnormal; Notable for the following:    Glucose-Capillary 263 (*)    All other components within normal limits  CBG MONITORING, ED  - Abnormal; Notable for the following:    Glucose-Capillary 274 (*)    All other components within normal limits  ETHANOL  PROTIME-INR  APTT  CBC  DIFFERENTIAL  URINE RAPID DRUG SCREEN, HOSP PERFORMED    Imaging Review Ct Head Wo Contrast  04/14/2015  CLINICAL DATA:  Patient with dizziness. Left facial weakness. History of prior stroke. EXAM: CT HEAD WITHOUT CONTRAST TECHNIQUE: Contiguous axial images were obtained from the base of the skull through the vertex without intravenous contrast. COMPARISON:  Brain CT 11/14/2014 FINDINGS: Ventricles and sulci are appropriate for patient's age. No evidence for acute cortically based infarct, intracranial hemorrhage, mass lesion or mass-effect. Orbits are unremarkable. Paranasal sinuses are well aerated. Mastoid air cells unremarkable. Calvarium is intact. IMPRESSION: No acute intracranial process. Electronically Signed   By: Lovey Newcomer M.D.   On: 04/14/2015 12:50   I have personally reviewed and evaluated these images and lab results as part of my medical decision-making.   EKG Interpretation   Date/Time:  Monday April 14 2015 11:40:02 EST Ventricular Rate:  70 PR Interval:  134 QRS Duration: 80 QT Interval:  426 QTC Calculation: 460 R Axis:   68 Text Interpretation:  Normal sinus rhythm Nonspecific T wave abnormality  Abnormal ECG No significant change was found Confirmed by Wyvonnia Dusky  MD,  Aemilia Dedrick ([id number]) on 04/14/2015 11:57:25 AM      MDM   Final diagnoses:  Transient cerebral ischemia, unspecified transient cerebral ischemia type  Vertigo  Patient awoke with dizziness, lightheadedness and possible feeling of weakness in the left side of her face this morning. Last seen normal last night. Previous admission in October for similar symptoms and known to have any intracranial aneurysm as well as MCA stenosis.  Code stroke was not activated due to delay in presentation. No appreciable facial droop on exam.  CT head is negative. Labs are  reassuring. Hyperglycemia withtout DKA.  Patient needs rule out of stroke given history and risk factors. D/w Dr. Marily Memos.    I personally performed the services described in this documentation, which was scribed in my presence. The recorded information has been reviewed and is accurate.  Sherry Essex, MD 04/14/15 [phone]

## 2015-04-14 NOTE — ED Notes (Signed)
Patient ambulated to restroom with stand by nurse assist. Patient steady on feet.

## 2015-04-15 ENCOUNTER — Observation Stay (HOSPITAL_BASED_OUTPATIENT_CLINIC_OR_DEPARTMENT_OTHER): Payer: Self-pay

## 2015-04-15 ENCOUNTER — Observation Stay (HOSPITAL_COMMUNITY): Payer: Self-pay

## 2015-04-15 DIAGNOSIS — G459 Transient cerebral ischemic attack, unspecified: Secondary | ICD-10-CM

## 2015-04-15 LAB — LIPID PANEL
Cholesterol: 97 mg/dL (ref 0–200)
HDL: 47 mg/dL (ref >40)
LDL Cholesterol: NEGATIVE mg/dL (ref 0–99)
Total CHOL/HDL Ratio: 2.1 RATIO
Triglycerides: 285 mg/dL — ABNORMAL HIGH (ref <150)
VLDL: 57 mg/dL — ABNORMAL HIGH (ref 0–40)

## 2015-04-15 LAB — COMPREHENSIVE METABOLIC PANEL
ALT: 16 U/L (ref 14–54)
AST: 21 U/L (ref 15–41)
Albumin: 3.4 g/dL — ABNORMAL LOW (ref 3.5–5.0)
Alkaline Phosphatase: 87 U/L (ref 38–126)
Anion gap: 9 (ref 5–15)
BUN: 18 mg/dL (ref 6–20)
CO2: 24 mmol/L (ref 22–32)
Calcium: 9.1 mg/dL (ref 8.9–10.3)
Chloride: 106 mmol/L (ref 101–111)
Creatinine, Ser: 0.7 mg/dL (ref 0.44–1.00)
GFR calc Af Amer: >60 mL/min (ref >60)
GFR calc non Af Amer: >60 mL/min (ref >60)
Glucose, Bld: 399 mg/dL — ABNORMAL HIGH (ref 65–99)
Potassium: 4.6 mmol/L (ref 3.5–5.1)
Sodium: 139 mmol/L (ref 135–145)
Total Bilirubin: 0.9 mg/dL (ref 0.3–1.2)
Total Protein: 6 g/dL — ABNORMAL LOW (ref 6.5–8.1)

## 2015-04-15 LAB — GLUCOSE, CAPILLARY
Glucose-Capillary: 306 mg/dL — ABNORMAL HIGH (ref 65–99)
Glucose-Capillary: 347 mg/dL — ABNORMAL HIGH (ref 65–99)
Glucose-Capillary: 334 mg/dL — ABNORMAL HIGH (ref 65–99)
Glucose-Capillary: 273 mg/dL — ABNORMAL HIGH (ref 65–99)
Glucose-Capillary: 127 mg/dL — ABNORMAL HIGH (ref 65–99)
Glucose-Capillary: 107 mg/dL — ABNORMAL HIGH (ref 65–99)

## 2015-04-15 LAB — CBC
HCT: 40.8 % (ref 36.0–46.0)
Hemoglobin: 13.1 g/dL (ref 12.0–15.0)
MCH: 28.9 pg (ref 26.0–34.0)
MCHC: 32.1 g/dL (ref 30.0–36.0)
MCV: 90.1 fL (ref 78.0–100.0)
Platelets: 269 10*3/uL (ref 150–400)
RBC: 4.53 MIL/uL (ref 3.87–5.11)
RDW: 14.3 % (ref 11.5–15.5)
WBC: 6 10*3/uL (ref 4.0–10.5)

## 2015-04-15 MED ORDER — GABAPENTIN 300 MG PO CAPS
600.0000 mg | ORAL_CAPSULE | Freq: Two times a day (BID) | ORAL | Status: DC
  Administered 2015-04-15 – 2015-04-16 (×2): 600 mg via ORAL
  Filled 2015-04-15 (×2): qty 2

## 2015-04-15 MED ORDER — GABAPENTIN 300 MG PO CAPS
300.0000 mg | ORAL_CAPSULE | Freq: Once | ORAL | Status: AC
  Administered 2015-04-15: 300 mg via ORAL
  Filled 2015-04-15: qty 1

## 2015-04-15 MED ORDER — LINAGLIPTIN 5 MG PO TABS
2.5000 mg | ORAL_TABLET | Freq: Every day | ORAL | Status: DC
  Administered 2015-04-15 – 2015-04-16 (×2): 2.5 mg via ORAL
  Filled 2015-04-15 (×2): qty 1

## 2015-04-15 MED ORDER — INSULIN ASPART 100 UNIT/ML ~~LOC~~ SOLN: 0.0000 [IU] | Freq: Every day | SUBCUTANEOUS | Status: DC

## 2015-04-15 MED ORDER — INSULIN ASPART 100 UNIT/ML ~~LOC~~ SOLN
0.0000 [IU] | Freq: Three times a day (TID) | SUBCUTANEOUS | Status: DC
  Administered 2015-04-15: 11 [IU] via SUBCUTANEOUS
  Administered 2015-04-16 (×2): 4 [IU] via SUBCUTANEOUS

## 2015-04-15 MED ORDER — REGADENOSON 0.4 MG/5ML IV SOLN
INTRAVENOUS | Status: AC
  Filled 2015-04-15: qty 5

## 2015-04-15 MED ORDER — INSULIN GLARGINE 100 UNIT/ML ~~LOC~~ SOLN
20.0000 [IU] | Freq: Every day | SUBCUTANEOUS | Status: DC
  Administered 2015-04-15 – 2015-04-16 (×2): 20 [IU] via SUBCUTANEOUS
  Filled 2015-04-15 (×2): qty 0.2

## 2015-04-15 NOTE — Progress Notes (Signed)
*  PRELIMINARY RESULTS* Vascular Ultrasound Carotid Duplex (Doppler) has been completed. There is no obvious evidence of hemodynamically significant internal carotid artery stenosis bilaterally. Vertebral arteries are patent with antegrade flow.   04/15/2015 12:00 PM Maudry Mayhew, RVT, RDCS, RDMS

## 2015-04-15 NOTE — Progress Notes (Signed)
Occupational Therapy Evaluation Patient Details Name: Sherry [name] MRN: [id number] DOB: 06/11/1950 Today's Date: 04/15/2015    History of Present Illness 65 y.o. 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 Presents to ER with left-sided facial numbness and left eye drooping since waking up 3.5.2017. MRI negative for CVA    Clinical Impression   PTA, pt independent with ADL and occasionally used a cane for mobility. Pt completed an ADL with S. States her visual deficits "come and go" and describes them as "sprays" in her "outside vision". Does not appear to have a field deficit. No c/o diplopia. Vascular lab arrived to take pt for a procedure. Recommend pt follow up with her eye doctor to further assess vision. Pt appropriate to D/C home with intermittent S when medically stable. Will follow up in am to discuss compensatory strategies for low vision.     Follow Up Recommendations  No OT follow up;Supervision - Intermittent  Follow up with eye doctor   Equipment Recommendations  None recommended by OT    Recommendations for Other Services       Precautions / Restrictions Precautions Precautions: None Restrictions Weight Bearing Restrictions: No      Mobility Bed Mobility Overal bed mobility: Modified Independent                Transfers Overall transfer level: Modified independent                    Balance Overall balance assessment: No apparent balance deficits (not formally assessed)   Sitting balance-Leahy Scale: Good       Standing balance-Leahy Scale: Fair                              ADL Overall ADL's : Needs assistance/impaired                                     Functional mobility during ADLs: Supervision/safety General ADL Comments: Overall  S with ADL after set up      Vision Vision Assessment?: Yes Eye Alignment: Within Functional Limits Ocular Range of Motion: Within Functional Limits Alignment/Gaze Preference: Within Defined Limits Tracking/Visual Pursuits: Able to track stimulus in all quads without difficulty Saccades: Within functional limits Convergence: Within functional limits Visual Fields: No apparent deficits Additional Comments: complains of sseing "sprays" of light; curtain coming down over eye like her eye lid was shutting . states symptoms "come and go"   Perception Perception Perception Tested?: Yes Comments: WFL   Praxis Praxis Praxis tested?: Within functional limits    Pertinent Vitals/Pain Pain Assessment: No/denies pain Faces Pain Scale: Hurts a little bit Pain Location: head Pain Descriptors / Indicators: Aching Pain Intervention(s): Limited activity within patient's tolerance     Hand Dominance Right   Extremity/Trunk Assessment Upper Extremity Assessment Upper Extremity Assessment: Overall WFL for tasks assessed   Lower Extremity Assessment Lower Extremity Assessment: Generalized weakness   Cervical / Trunk Assessment Cervical / Trunk Assessment: Normal   Communication Communication Communication: No difficulties   Cognition Arousal/Alertness: Awake/alert Behavior During Therapy: WFL for tasks assessed/performed Overall Cognitive Status: Within Functional Limits for tasks assessed  General Comments       Exercises Exercises: Other exercises     Shoulder Instructions      Home Living Family/patient expects to be discharged to:: Private residence Living Arrangements: Spouse/significant other Available Help at Discharge: Family Type of Home: House Home Access: Stairs to enter [employer] of Steps: 2+2 Entrance Stairs-Rails: None Home Layout: Two level;Able to live on main level with bedroom/bathroom Alternate Level Stairs-Number  of Steps: 13 Alternate Level Stairs-Rails: None Bathroom Shower/Tub: Other (comment) (garden tub)   Bathroom Toilet: Standard Bathroom Accessibility: No How Accessible: Accessible via walker Home Equipment: Grab bars - tub/shower;Cane - single point   Additional Comments: husband present and moving about room without difficulty      Prior Functioning/Environment Level of Independence: Independent        Comments: uses shopping cart for stability at store    OT Diagnosis: Generalized weakness;Disturbance of vision;Acute pain   OT Problem List: Decreased activity tolerance;Impaired vision/perception;Decreased knowledge of use of DME or AE;Obesity;Pain   OT Treatment/Interventions: Self-care/ADL training;Therapeutic exercise;DME and/or AE instruction;Therapeutic activities;Visual/perceptual remediation/compensation;Patient/family education    OT Goals(Current goals can be found in the care plan section) Acute Rehab OT Goals Patient Stated Goal: to get back to normal OT Goal Formulation: With patient Time For Goal Achievement: 04/22/15 Potential to Achieve Goals: Good  OT Frequency: Min 2X/week   Barriers to D/C:            Co-evaluation              End of Session Nurse Communication: Mobility status  Activity Tolerance: Patient tolerated treatment well Patient left: in bed;with call bell/phone within reach;Other (comment)   [id number] OT Time Calculation (min): 19 min Charges:  OT General Charges $OT Visit: 1 Procedure OT Evaluation $OT Eval Moderate Complexity: 1 Procedure G-Codes: OT G-codes **NOT FOR INPATIENT CLASS** Functional Assessment Tool Used: clinical judgement Functional Limitation: Self care Self Care Current Status [id number]): At least 1 percent but less than 20 percent impaired, limited or restricted Self Care Goal Status [id number]): At least 1 percent but less than 20 percent impaired, limited or restricted  [name],HILLARY 04/15/2015, 1:49  PM   [facility], OTR/L  [phone] 04/15/2015

## 2015-04-15 NOTE — Plan of Care (Signed)
Problem: Acute Rehab PT Goals(only PT should resolve) Goal: Pt Will Ambulate On unlevel surface Goal: Pt Will Go Up/Down Stairs With no rails

## 2015-04-15 NOTE — Progress Notes (Signed)
  Echocardiogram 2D Echocardiogram has been performed.  Sherry [name] 04/15/2015, 12:21 PM

## 2015-04-15 NOTE — Progress Notes (Signed)
[facility] PROGRESS NOTE  Maday Godleski [id number] DOB: [birth year] DOA: 04/14/2015 PCP: Carmie Kanner, NP  Assessment/Plan:  Principal Problem:   Left face numbness, headache: MRI neg. Carotid dopplers, echo ok. LDL below 70. hgb a1c 12/16 6.6. Neuro recommends EEG, will order Active Problems:   Diabetes mellitus with complication ([place]), uncontrolled. Reports having run out of metformin a few days ago. Add lantus and increase SSI   Essential hypertension   Aneurysm ([place])   Code Status:  full Family Communication:  husband Disposition Plan:  home  Consultants:  neuro  Procedures:     Antibiotics:    HPI/Subjective: Facial numbness resolved. CBGs have been running high after running out of metformin  Objective: Filed Vitals:   04/15/15 1724 04/15/15 2125  BP: 116/54 108/59  Pulse: 67 60  Temp: 98 F (36.7 C) 98.1 F (36.7 C)  Resp: 17 18    Intake/Output Summary (Last 24 hours) at 04/15/15 2241 Last data filed at 04/15/15 1800  Gross per 24 hour  Intake  882.5 ml  Output      0 ml  Net  882.5 ml   Filed Weights   04/14/15 1138 04/14/15 1840  Weight: 93.441 kg (206 lb) 89.5 kg (197 lb 5 oz)    Exam:   General:  A and o  Cardiovascular: RRR without MGr  Respiratory: CTA without WRR  Abdomen: S, NT< ND  Ext: no CCE  Neuro: CN intact. Motor 5/5. Sensation intact  Basic Metabolic Panel:  Recent Labs Lab 04/14/15 1205 04/14/15 2141 04/15/15 0644  NA 142  --  139  K 3.9  --  4.6  CL 104  --  106  CO2 26  --  24  GLUCOSE 293*  --  399*  BUN 17  --  18  CREATININE 0.75 0.76 0.70  CALCIUM 9.0  --  9.1   Liver Function Tests:  Recent Labs Lab 04/14/15 1205 04/15/15 0644  AST 18 21  ALT 22 16  ALKPHOS 85 87  BILITOT 0.9 0.9  PROT 6.9 6.0*  ALBUMIN 4.2 3.4*   No results for input(s): LIPASE, AMYLASE in the last 168 hours. No results for input(s): AMMONIA in the last 168 hours. CBC:  Recent Labs Lab  04/14/15 1205 04/14/15 2141 04/15/15 0644  WBC 7.8 7.6 6.0  NEUTROABS 4.3  --   --   HGB 13.5 13.0 13.1  HCT 41.8 40.1 40.8  MCV 88.9 89.7 90.1  PLT 268 269 269   Cardiac Enzymes: No results for input(s): CKTOTAL, CKMB, CKMBINDEX, TROPONINI in the last 168 hours. BNP (last 3 results) No results for input(s): BNP in the last 8760 hours.  ProBNP (last 3 results) No results for input(s): PROBNP in the last 8760 hours.  CBG:  Recent Labs Lab 04/15/15 0011 04/15/15 0819 04/15/15 1108 04/15/15 1721 04/15/15 2126  GLUCAP 306* 347* 334* 273* 127*    No results found for this or any previous visit (from the past 240 hour(s)).   Studies: Ct Head Wo Contrast  04/14/2015  CLINICAL DATA:  Patient with dizziness. Left facial weakness. History of prior stroke. EXAM: CT HEAD WITHOUT CONTRAST TECHNIQUE: Contiguous axial images were obtained from the base of the skull through the vertex without intravenous contrast. COMPARISON:  Brain CT 11/14/2014 FINDINGS: Ventricles and sulci are appropriate for patient's age. No evidence for acute cortically based infarct, intracranial hemorrhage, mass lesion or mass-effect. Orbits are unremarkable. Paranasal sinuses are well aerated. Mastoid air cells unremarkable. Calvarium is  intact. IMPRESSION: No acute intracranial process. Electronically Signed   By: Lovey Newcomer M.D.   On: 04/14/2015 12:50   Mr Brain Wo Contrast  04/15/2015  CLINICAL DATA:  Initial evaluation for acute dizziness, weakness on left side of face. EXAM: MRI HEAD WITHOUT CONTRAST MRA HEAD WITHOUT CONTRAST TECHNIQUE: Multiplanar, multiecho pulse sequences of the brain and surrounding structures were obtained without intravenous contrast. Angiographic images of the head were obtained using MRA technique without contrast. COMPARISON:  Prior CT from 04/14/2015 as well as previous MRI from 11/11/2015 and MRA from 11/12/2015. FINDINGS: MRI HEAD FINDINGS Cerebral volume within normal limits for  patient age. No focal parenchymal signal abnormality identified. No significant white matter disease. No abnormal foci of restricted diffusion to suggest acute intracranial infarct. Gray-white matter differentiation maintained. Major intracranial vascular flow voids preserved. No acute or chronic intracranial hemorrhage. No areas of chronic infarction. No mass lesion, midline shift, or mass effect. No hydrocephalus. No extra-axial fluid collection. Craniocervical junction within normal limits. Mild degenerative spondylolysis within the upper cervical spine without significant stenosis. Pituitary gland normal.  No acute abnormality about the orbits. Small retention cyst within the right maxilla sinus. Paranasal sinuses are otherwise largely clear. No mastoid effusion. Inner ear structures within normal limits. Bone marrow signal intensity within normal limits. Hyperostosis frontalis interna noted. No scalp soft tissue abnormality. MRA HEAD FINDINGS ANTERIOR CIRCULATION: Visualized distal cervical segments of the internal carotid arteries are patent with antegrade flow. Petrous, cavernous, and supraclinoid segments are patent without high-grade flow-limiting stenosis. Again seen is a focal aneurysm arising from the proximal cavernous right ICA measuring approximately 5 x 2 mm. This is directed laterally, inferiorly, and posteriorly, and likely extradural in location. This is not significantly changed relative to prior. Right A1 segment patent. Left A1 segment absent. Anterior communicating artery and anterior cerebral arteries well opacified. Left M1 segment widely patent without stenosis or occlusion. Left MCA bifurcation normal. Mild distal branch atheromatous irregularity within the distal left MCA branches. Proximal right M1 segment tortuous with mild stenosis. No high-grade stenosis. Right MCA bifurcation normal. Distal branch atheromatous irregularity within the right MCA branches. POSTERIOR CIRCULATION:  Vertebral arteries are patent to the vertebrobasilar junction. Posterior inferior cerebral arteries patent. Basilar artery widely patent. Superior cerebellar arteries patent bilaterally. Fetal origin of the left PCA supplied via a widely patent left posterior communicating artery. Right PCA arises from the basilar artery, although a small right posterior communicating artery noted as well. Multi focal atheromatous irregularity within the PCAs bilaterally without proximal high-grade stenosis. PCAs are opacified to their distal aspect. IMPRESSION: MRI HEAD IMPRESSION: Normal brain MRI. No acute intracranial infarct or other abnormality identified. MRA HEAD IMPRESSION: 1. Stable intracranial MRA. No large or proximal arterial branch occlusion. 2. Mild branch vessel atherosclerotic changes without proximal severe or correctable stenosis. 3. 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 Electronically Signed   By: Jeannine Boga M.D.   On: 04/15/2015 06:28   Mr Jodene Nam Head/brain Wo Cm  04/15/2015  CLINICAL DATA:  Initial evaluation for acute dizziness, weakness on left side of face. EXAM: MRI HEAD WITHOUT CONTRAST MRA HEAD WITHOUT CONTRAST TECHNIQUE: Multiplanar, multiecho pulse sequences of the brain and surrounding structures were obtained without intravenous contrast. Angiographic images of the head were obtained using MRA technique without contrast. COMPARISON:  Prior CT from 04/14/2015 as well as previous MRI from 11/11/2015 and MRA from 11/12/2015. FINDINGS: MRI HEAD FINDINGS Cerebral volume within normal limits for patient age. No focal parenchymal signal abnormality  identified. No significant white matter disease. No abnormal foci of restricted diffusion to suggest acute intracranial infarct. Gray-white matter differentiation maintained. Major intracranial vascular flow voids preserved. No acute or chronic intracranial hemorrhage. No areas of chronic infarction. No mass lesion, midline shift, or mass effect. No  hydrocephalus. No extra-axial fluid collection. Craniocervical junction within normal limits. Mild degenerative spondylolysis within the upper cervical spine without significant stenosis. Pituitary gland normal.  No acute abnormality about the orbits. Small retention cyst within the right maxilla sinus. Paranasal sinuses are otherwise largely clear. No mastoid effusion. Inner ear structures within normal limits. Bone marrow signal intensity within normal limits. Hyperostosis frontalis interna noted. No scalp soft tissue abnormality. MRA HEAD FINDINGS ANTERIOR CIRCULATION: Visualized distal cervical segments of the internal carotid arteries are patent with antegrade flow. Petrous, cavernous, and supraclinoid segments are patent without high-grade flow-limiting stenosis. Again seen is a focal aneurysm arising from the proximal cavernous right ICA measuring approximately 5 x 2 mm. This is directed laterally, inferiorly, and posteriorly, and likely extradural in location. This is not significantly changed relative to prior. Right A1 segment patent. Left A1 segment absent. Anterior communicating artery and anterior cerebral arteries well opacified. Left M1 segment widely patent without stenosis or occlusion. Left MCA bifurcation normal. Mild distal branch atheromatous irregularity within the distal left MCA branches. Proximal right M1 segment tortuous with mild stenosis. No high-grade stenosis. Right MCA bifurcation normal. Distal branch atheromatous irregularity within the right MCA branches. POSTERIOR CIRCULATION: Vertebral arteries are patent to the vertebrobasilar junction. Posterior inferior cerebral arteries patent. Basilar artery widely patent. Superior cerebellar arteries patent bilaterally. Fetal origin of the left PCA supplied via a widely patent left posterior communicating artery. Right PCA arises from the basilar artery, although a small right posterior communicating artery noted as well. Multi focal  atheromatous irregularity within the PCAs bilaterally without proximal high-grade stenosis. PCAs are opacified to their distal aspect. IMPRESSION: MRI HEAD IMPRESSION: Normal brain MRI. No acute intracranial infarct or other abnormality identified. MRA HEAD IMPRESSION: 1. Stable intracranial MRA. No large or proximal arterial branch occlusion. 2. Mild branch vessel atherosclerotic changes without proximal severe or correctable stenosis. 3. 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 Electronically Signed   By: Jeannine Boga M.D.   On: 04/15/2015 06:28    Scheduled Meds: . aspirin  300 mg Rectal Daily   Or  . aspirin  325 mg Oral Daily  . atorvastatin  40 mg Oral Daily  . budesonide  0.25 mg Nebulization BID  . canagliflozin  100 mg Oral QAC breakfast  . docusate sodium  100 mg Oral Daily  . enoxaparin (LOVENOX) injection  40 mg Subcutaneous Q24H  . gabapentin  600 mg Oral BID  . insulin aspart  0-20 Units Subcutaneous TID WC  . insulin aspart  0-5 Units Subcutaneous QHS  . insulin glargine  20 Units Subcutaneous Daily  . ipratropium  2 spray Nasal QID  . linagliptin  2.5 mg Oral Daily  . lisinopril  20 mg Oral Daily  . loratadine  10 mg Oral Daily  . metoprolol tartrate  25 mg Oral BID  . pantoprazole  40 mg Oral Daily  . repaglinide  2 mg Oral BID AC  . sertraline  50 mg Oral QHS   Continuous Infusions:   Time spent: w5 minutes  Javeria Briski L  [facility] [email or web address], password [facility] 04/15/2015, 10:41 PM

## 2015-04-15 NOTE — Progress Notes (Signed)
Inpatient Diabetes Program Recommendations  AACE/ADA: New Consensus Statement on Inpatient Glycemic Control (2015)  Target Ranges:  Prepandial:   less than 140 mg/dL      Peak postprandial:   less than 180 mg/dL (1-2 hours)      Critically ill patients:  140 - 180 mg/dL   Review of Glycemic Control  Diabetes history: DM 2 Outpatient Diabetes medications: Jardiance 5 mg Daily, Metformin 1,000 BID, Prandin 2 mg BID with meals Current orders for Inpatient glycemic control: Invokana 100 mg Daily, Prandin 2 mg BID with meals, Novolog Sensitive  Inpatient Diabetes Program Recommendations:   Glucose elevated around 300's this am. Patient to receive medications that were ordered last night this am for the first time. Watch trends.   Thanks,  Tama Headings RN, MSN, [facility] Inpatient Diabetes Coordinator Team Pager [phone] (8a-5p)

## 2015-04-15 NOTE — Consult Note (Signed)
Requesting Physician: Dr. Fabio Neighbors    Chief Complaint: transient left face numbness  HPI:                                                                                                                                         Sherry [name] is an 65 y.o. female with history of TIA in October 2016 and at that time MRI did show brain aneurysm for which patient had undergone cerebral angiogram which showed 7 into 4 mm cavernous aneurysm and right ICA 70-75% stenosis presence of the ER with left-sided facial numbness and left eye drooping since waking up 3.5.2017.  MRI negative for CVA and MRA shows previous focal aneurysm arising from the proximal cavernous right ICA measuring approximately 5 x 2 mm. This is directed laterally, inferiorly, and posteriorly, and likely extradural in location. This is not significantly changed relative to prior.  Patient is not a great historian but does state she had a right parietal HA and during the event she felt like only her left eye was involved. At first she stated she felt like the was a shade over er eye and then noted colorful "sprinkles like a shower from a hose going across the left lateral vision". No limb involvements but as stated she felt left face was tingling.   Date last known well: 3.5.2017 Time last known well: Unable to determine tPA Given: No: out of window     Past Medical History  Diagnosis Date  . Diabetes mellitus   . Hypertension   . GERD (gastroesophageal reflux disease)   . Dyslipidemia   . Mitral valve prolapse occasional palpitations w/ chest discomfort  . DJD (degenerative joint disease)   . Meniere's disease   . Rotator cuff tear, left   . History of CHF (congestive heart failure) 2010-  RESOLVED  . H/O hiatal hernia   . Complication of anesthesia HYPOTENSION INTRAOP W/ HYSTERECTOMY IN 1987--  DID OK W/ TOTAL KNEE IN 2008  . OA (osteoarthritis)   . History of peptic ulcer YRS AGO  . Stroke ([place])   . Cerebral aneurysm      Past Surgical History  Procedure Laterality Date  . Total knee arthroplasty  09-12-2006  [facility]    RIGHT KNEE  . Vaginal hysterectomy  1987  . Bunionectomy  2007    LEFT FOOT  . Tympanoplasty  1990;   1980  . Left shoulder surgery  1997    ROTATOR CUFF REPAIR  . Tubal ligation  1976  . Shoulder arthroscopy  09/20/2011    Procedure: ARTHROSCOPY SHOULDER;  Surgeon: Johnn Hai, MD;  Location: [facility];  Service: Orthopedics;  Laterality: Left;  SAD AND MINI OPEN ROTATOR CUFF REPAIR      Family History  Problem Relation Age of Onset  . Heart disease Mother     in her 75s  . Diabetes Mother   .  Kidney disease Mother   . Thyroid disease Mother   . Aneurysm Mother   . Ovarian cancer Mother   . Thyroid disease Brother   . Thyroid disease Sister   . Heart disease Sister   . Thyroid disease Paternal Grandmother   . Heart attack Sister     at age 65  . Lymphoma Father    Social History:  reports that she has never smoked. She has never used smokeless tobacco. She reports that she does not drink alcohol or use illicit drugs.  Allergies:  Allergies  Allergen Reactions  . Influenza Vaccines Anaphylaxis  . Codeine Itching  . Demerol Other (See Comments)    SEIZURE  . Nsaids Other (See Comments)    NAPROXEN, IBUPROFEN, SKELAXIN---  CAUSES PALPITATIONS  . Prednisone     Caused her glucose to go up to 500 and went to ED  . Tramadol     Contraindicated with Victoza     Medications:                                                                                                                           Prior to Admission:  Prescriptions prior to admission  Medication Sig Dispense Refill Last Dose  . albuterol (PROVENTIL HFA;VENTOLIN HFA) 108 (90 BASE) MCG/ACT inhaler Inhale 2 puffs into the lungs every 6 (six) hours as needed. For shortness of breath.   Taking  . albuterol (PROVENTIL) (2.5 MG/3ML) 0.083% nebulizer solution Take 3 mLs (2.5 mg total) by  nebulization every 6 (six) hours as needed for wheezing or shortness of breath. 150 mL 1 Taking  . aspirin 81 MG EC tablet Take 4 tablets (325 mg total) by mouth daily. 30 tablet 0 Taking  . atorvastatin (LIPITOR) 80 MG tablet Take 0.5 tablets (40 mg total) by mouth daily. 100 tablet 1 Taking  . cetirizine (ZYRTEC) 10 MG tablet Take 10 mg by mouth daily.   Taking  . docusate sodium (COLACE) 100 MG capsule Take 100 mg by mouth daily.   Taking  . empagliflozin (JARDIANCE) 10 MG TABS tablet Take 5 mg by mouth daily. 15 tablet 11 Taking  . furosemide (LASIX) 20 MG tablet TAKE 1 TABLET(20 MG) BY MOUTH DAILY 30 tablet 0 Taking  . gabapentin (NEURONTIN) 300 MG capsule Take 1 capsule (300 mg total) by mouth 2 (two) times daily. 60 capsule 5 Taking  . HYDROcodone-acetaminophen (NORCO/VICODIN) 5-325 MG tablet Take 1 tablet by mouth every 6 (six) hours as needed for moderate pain. 30 tablet 0   . ipratropium (ATROVENT) 0.03 % nasal spray Place 2 sprays into the nose 4 (four) times daily. 30 mL 6 Taking  . linagliptin (TRADJENTA) 5 MG TABS tablet Take 0.5 tablets (2.5 mg total) by mouth daily. (Patient not taking: Reported on 02/18/2015) 15 tablet 11 Not Taking  . lisinopril (PRINIVIL,ZESTRIL) 20 MG tablet TAKE 1 TABLET BY MOUTH DAILY 30 tablet 0 Taking  . metFORMIN (GLUCOPHAGE) 500 MG tablet TAKE  2 TABLETS(1000 MG) BY MOUTH TWICE DAILY WITH A MEAL 120 tablet 0 Taking  . metoprolol tartrate (LOPRESSOR) 25 MG tablet Take 1 tablet (25 mg total) by mouth 2 (two) times daily. 60 tablet 5 Taking  . mometasone (ASMANEX) 220 MCG/INH inhaler Use 1 puff daily followed by rinsing your mouth out with water. 1 Inhaler 12 Taking  . omeprazole (PRILOSEC) 20 MG capsule Take 20 mg by mouth.   Taking  . repaglinide (PRANDIN) 2 MG tablet Take 1 tablet (2 mg total) by mouth 2 (two) times daily before a meal. 90 tablet 11 Taking  . sertraline (ZOLOFT) 50 MG tablet Take 1 tablet (50 mg total) by mouth at bedtime. 30 tablet 0 Taking    Scheduled: . aspirin  300 mg Rectal Daily   Or  . aspirin  325 mg Oral Daily  . atorvastatin  40 mg Oral Daily  . budesonide  0.25 mg Nebulization BID  . canagliflozin  100 mg Oral QAC breakfast  . docusate sodium  100 mg Oral Daily  . enoxaparin (LOVENOX) injection  40 mg Subcutaneous Q24H  . gabapentin  600 mg Oral BID  . insulin aspart  0-9 Units Subcutaneous TID WC  . ipratropium  2 spray Nasal QID  . lisinopril  20 mg Oral Daily  . loratadine  10 mg Oral Daily  . metoprolol tartrate  25 mg Oral BID  . pantoprazole  40 mg Oral Daily  . regadenoson      . repaglinide  2 mg Oral BID AC  . sertraline  50 mg Oral QHS    ROS:                                                                                                                                       History obtained from the patient  General ROS: negative for - chills, fatigue, fever, night sweats, weight gain or weight loss Psychological ROS: negative for - behavioral disorder, hallucinations, memory difficulties, mood swings or suicidal ideation Ophthalmic ROS: negative for - blurry vision, double vision, eye pain or loss of vision ENT ROS: negative for - epistaxis, nasal discharge, oral lesions, sore throat, tinnitus or vertigo Allergy and Immunology ROS: negative for - hives or itchy/watery eyes Hematological and Lymphatic ROS: negative for - bleeding problems, bruising or swollen lymph nodes Endocrine ROS: negative for - galactorrhea, hair pattern changes, polydipsia/polyuria or temperature intolerance Respiratory ROS: negative for - cough, hemoptysis, shortness of breath or wheezing Cardiovascular ROS: negative for - chest pain, dyspnea on exertion, edema or irregular heartbeat Gastrointestinal ROS: negative for - abdominal pain, diarrhea, hematemesis, nausea/vomiting or stool incontinence Genito-Urinary ROS: negative for - dysuria, hematuria, incontinence or urinary frequency/urgency Musculoskeletal ROS: negative  for - joint swelling or muscular weakness Neurological ROS: as noted in HPI Dermatological ROS: negative for rash and skin lesion changes  Neurologic Examination:  Blood pressure 131/57, pulse 53, temperature 97.9 F (36.6 C), temperature source Oral, resp. rate 18, height 5\' 2"  (1.575 m), weight 89.5 kg (197 lb 5 oz), SpO2 100 %.  HEENT-  Normocephalic, no lesions, without obvious abnormality.  Normal external eye and conjunctiva.  Normal TM's bilaterally.  Normal auditory canals and external ears. Normal external nose, mucus membranes and septum.  Normal pharynx. Cardiovascular- S1, S2 normal, pulses palpable throughout   Lungs- chest clear, no wheezing, rales, normal symmetric air entry Abdomen- normal findings: bowel sounds normal Extremities- no edema Lymph-no adenopathy palpable Musculoskeletal-no joint tenderness, deformity or swelling Skin-warm and dry, no hyperpigmentation, vitiligo, or suspicious lesions  Neurological Examination Mental Status: Alert, oriented, thought content appropriate.  Speech fluent without evidence of aphasia.  Able to follow 3 step commands without difficulty. Cranial Nerves: II:  Visual fields grossly normal, pupils equal, round, reactive to light and accommodation III,IV, VI: ptosis not present, extra-ocular motions intact bilaterally V,VII: smile symmetric, facial light touch sensation normal bilaterally VIII: hearing normal bilaterally IX,X: uvula rises symmetrically XI: bilateral shoulder shrug XII: midline tongue extension Motor: Right : Upper extremity   5/5    Left:     Upper extremity   5/5  Lower extremity   5/5     Lower extremity   5/5 Tone and bulk:normal tone throughout; no atrophy noted Sensory: Pinprick and light touch intact throughout, bilaterally Deep Tendon Reflexes: 1+ and symmetric throughout UE no KJ or AJ Plantars: Right:  downgoing   Left: downgoing Cerebellar: normal finger-to-nose  and normal heel-to-shin test Gait: not tested       Lab Results: Basic Metabolic Panel:  Recent Labs Lab 04/14/15 1205 04/14/15 2141 04/15/15 0644  NA 142  --  139  K 3.9  --  4.6  CL 104  --  106  CO2 26  --  24  GLUCOSE 293*  --  399*  BUN 17  --  18  CREATININE 0.75 0.76 0.70  CALCIUM 9.0  --  9.1    Liver Function Tests:  Recent Labs Lab 04/14/15 1205 04/15/15 0644  AST 18 21  ALT 22 16  ALKPHOS 85 87  BILITOT 0.9 0.9  PROT 6.9 6.0*  ALBUMIN 4.2 3.4*   No results for input(s): LIPASE, AMYLASE in the last 168 hours. No results for input(s): AMMONIA in the last 168 hours.  CBC:  Recent Labs Lab 04/14/15 1205 04/14/15 2141 04/15/15 0644  WBC 7.8 7.6 6.0  NEUTROABS 4.3  --   --   HGB 13.5 13.0 13.1  HCT 41.8 40.1 40.8  MCV 88.9 89.7 90.1  PLT 268 269 269    Cardiac Enzymes: No results for input(s): CKTOTAL, CKMB, CKMBINDEX, TROPONINI in the last 168 hours.  Lipid Panel:  Recent Labs Lab 04/15/15 0644  CHOL 97  TRIG 285*  HDL 47  CHOLHDL 2.1  VLDL 57*  LDLCALC NEG 7    CBG:  Recent Labs Lab 04/14/15 1137 04/14/15 1238 04/14/15 1705 04/15/15 0011 04/15/15 0819  GLUCAP 274* 263* 190* 306* 27*    Microbiology: Results for orders placed or performed during the hospital encounter of 11/01/09  Urine culture     Status: None   Collection Time: 11/01/09  6:40 PM  Result Value Ref Range Status   Specimen Description URINE, CLEAN CATCH  Final   Special Requests NONE  Final   Culture  Setup Time [id number]  Final   Colony Count 7,000 COLONIES/ML  Final   Culture  INSIGNIFICANT GROWTH  Final   Report Status 11/03/2009 FINAL  Final    Coagulation Studies:  Recent Labs  04/14/15 1205  LABPROT 13.0  INR 0.96    Imaging: Ct Head Wo Contrast  04/14/2015  CLINICAL DATA:  Patient with dizziness. Left facial weakness. History of prior stroke. EXAM: CT HEAD WITHOUT  CONTRAST TECHNIQUE: Contiguous axial images were obtained from the base of the skull through the vertex without intravenous contrast. COMPARISON:  Brain CT 11/14/2014 FINDINGS: Ventricles and sulci are appropriate for patient's age. No evidence for acute cortically based infarct, intracranial hemorrhage, mass lesion or mass-effect. Orbits are unremarkable. Paranasal sinuses are well aerated. Mastoid air cells unremarkable. Calvarium is intact. IMPRESSION: No acute intracranial process. Electronically Signed   By: Lovey Newcomer M.D.   On: 04/14/2015 12:50   Mr Brain Wo Contrast  04/15/2015  CLINICAL DATA:  Initial evaluation for acute dizziness, weakness on left side of face. EXAM: MRI HEAD WITHOUT CONTRAST MRA HEAD WITHOUT CONTRAST TECHNIQUE: Multiplanar, multiecho pulse sequences of the brain and surrounding structures were obtained without intravenous contrast. Angiographic images of the head were obtained using MRA technique without contrast. COMPARISON:  Prior CT from 04/14/2015 as well as previous MRI from 11/11/2015 and MRA from 11/12/2015. FINDINGS: MRI HEAD FINDINGS Cerebral volume within normal limits for patient age. No focal parenchymal signal abnormality identified. No significant white matter disease. No abnormal foci of restricted diffusion to suggest acute intracranial infarct. Gray-white matter differentiation maintained. Major intracranial vascular flow voids preserved. No acute or chronic intracranial hemorrhage. No areas of chronic infarction. No mass lesion, midline shift, or mass effect. No hydrocephalus. No extra-axial fluid collection. Craniocervical junction within normal limits. Mild degenerative spondylolysis within the upper cervical spine without significant stenosis. Pituitary gland normal.  No acute abnormality about the orbits. Small retention cyst within the right maxilla sinus. Paranasal sinuses are otherwise largely clear. No mastoid effusion. Inner ear structures within normal  limits. Bone marrow signal intensity within normal limits. Hyperostosis frontalis interna noted. No scalp soft tissue abnormality. MRA HEAD FINDINGS ANTERIOR CIRCULATION: Visualized distal cervical segments of the internal carotid arteries are patent with antegrade flow. Petrous, cavernous, and supraclinoid segments are patent without high-grade flow-limiting stenosis. Again seen is a focal aneurysm arising from the proximal cavernous right ICA measuring approximately 5 x 2 mm. This is directed laterally, inferiorly, and posteriorly, and likely extradural in location. This is not significantly changed relative to prior. Right A1 segment patent. Left A1 segment absent. Anterior communicating artery and anterior cerebral arteries well opacified. Left M1 segment widely patent without stenosis or occlusion. Left MCA bifurcation normal. Mild distal branch atheromatous irregularity within the distal left MCA branches. Proximal right M1 segment tortuous with mild stenosis. No high-grade stenosis. Right MCA bifurcation normal. Distal branch atheromatous irregularity within the right MCA branches. POSTERIOR CIRCULATION: Vertebral arteries are patent to the vertebrobasilar junction. Posterior inferior cerebral arteries patent. Basilar artery widely patent. Superior cerebellar arteries patent bilaterally. Fetal origin of the left PCA supplied via a widely patent left posterior communicating artery. Right PCA arises from the basilar artery, although a small right posterior communicating artery noted as well. Multi focal atheromatous irregularity within the PCAs bilaterally without proximal high-grade stenosis. PCAs are opacified to their distal aspect. IMPRESSION: MRI HEAD IMPRESSION: Normal brain MRI. No acute intracranial infarct or other abnormality identified. MRA HEAD IMPRESSION: 1. Stable intracranial MRA. No large or proximal arterial branch occlusion. 2. Mild branch vessel atherosclerotic changes without proximal severe  or correctable  stenosis. 3. 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 Electronically Signed   By: Jeannine Boga M.D.   On: 04/15/2015 06:28   Mr Sherry [name] Head/brain Wo Cm  04/15/2015  CLINICAL DATA:  Initial evaluation for acute dizziness, weakness on left side of face. EXAM: MRI HEAD WITHOUT CONTRAST MRA HEAD WITHOUT CONTRAST TECHNIQUE: Multiplanar, multiecho pulse sequences of the brain and surrounding structures were obtained without intravenous contrast. Angiographic images of the head were obtained using MRA technique without contrast. COMPARISON:  Prior CT from 04/14/2015 as well as previous MRI from 11/11/2015 and MRA from 11/12/2015. FINDINGS: MRI HEAD FINDINGS Cerebral volume within normal limits for patient age. No focal parenchymal signal abnormality identified. No significant white matter disease. No abnormal foci of restricted diffusion to suggest acute intracranial infarct. Gray-white matter differentiation maintained. Major intracranial vascular flow voids preserved. No acute or chronic intracranial hemorrhage. No areas of chronic infarction. No mass lesion, midline shift, or mass effect. No hydrocephalus. No extra-axial fluid collection. Craniocervical junction within normal limits. Mild degenerative spondylolysis within the upper cervical spine without significant stenosis. Pituitary gland normal.  No acute abnormality about the orbits. Small retention cyst within the right maxilla sinus. Paranasal sinuses are otherwise largely clear. No mastoid effusion. Inner ear structures within normal limits. Bone marrow signal intensity within normal limits. Hyperostosis frontalis interna noted. No scalp soft tissue abnormality. MRA HEAD FINDINGS ANTERIOR CIRCULATION: Visualized distal cervical segments of the internal carotid arteries are patent with antegrade flow. Petrous, cavernous, and supraclinoid segments are patent without high-grade flow-limiting stenosis. Again seen is a focal aneurysm arising  from the proximal cavernous right ICA measuring approximately 5 x 2 mm. This is directed laterally, inferiorly, and posteriorly, and likely extradural in location. This is not significantly changed relative to prior. Right A1 segment patent. Left A1 segment absent. Anterior communicating artery and anterior cerebral arteries well opacified. Left M1 segment widely patent without stenosis or occlusion. Left MCA bifurcation normal. Mild distal branch atheromatous irregularity within the distal left MCA branches. Proximal right M1 segment tortuous with mild stenosis. No high-grade stenosis. Right MCA bifurcation normal. Distal branch atheromatous irregularity within the right MCA branches. POSTERIOR CIRCULATION: Vertebral arteries are patent to the vertebrobasilar junction. Posterior inferior cerebral arteries patent. Basilar artery widely patent. Superior cerebellar arteries patent bilaterally. Fetal origin of the left PCA supplied via a widely patent left posterior communicating artery. Right PCA arises from the basilar artery, although a small right posterior communicating artery noted as well. Multi focal atheromatous irregularity within the PCAs bilaterally without proximal high-grade stenosis. PCAs are opacified to their distal aspect. IMPRESSION: MRI HEAD IMPRESSION: Normal brain MRI. No acute intracranial infarct or other abnormality identified. MRA HEAD IMPRESSION: 1. Stable intracranial MRA. No large or proximal arterial branch occlusion. 2. Mild branch vessel atherosclerotic changes without proximal severe or correctable stenosis. 3. 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 Electronically Signed   By: Jeannine Boga M.D.   On: 04/15/2015 06:28   Etta Quill PA-C [facility] [phone]  04/15/2015, 9:45 AM   Assessment: 65 y.o. female with transient left facial numbness and left visual field lack of vision and then lights and scotoma. Currently asymptomatic. Cannot rule out TIA.    Stroke Risk Factors - diabetes mellitus, hyperlipidemia and hypertension   Recommend: 1. HgbA1c, fasting lipid panel 2. eeg 3. PT consult, OT consult, Speech consult 4. Echocardiogram 5. Carotid dopplers 6. Prophylactic therapy-Antiplatelet med: Aspirin - dose 325 daily 7. Risk factor modification 8. Telemetry monitoring 9.  Frequent neuro checks 10 NPO until passes stroke swallow screen  I personally participated in this patient's evaluation and management, including formulating above clinical impression and management recommendations.  Rush Farmer M.D. [facility] [phone]

## 2015-04-15 NOTE — Progress Notes (Signed)
EEG completed; results pending.    

## 2015-04-15 NOTE — Evaluation (Signed)
Physical Therapy Evaluation Patient Details Name: Sherry [name] MRN: [id number] DOB: 01/09/1951 Today's Date: 04/15/2015   History of Present Illness  65 y.o. 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 Presents to ER with left-sided facial numbness and left eye drooping since waking up 3.5.2017. MRI negative for CVA   Clinical Impression  Pt was able to demonstrate some tolerance for gait with maintenance of O2 sats and pulses, but is very tired afterward and is not safe to get up alone.  Will need assist at home and will anticipate PT keeping her for now to continue to prepare for home esp stairs with no rails.    Follow Up Recommendations Home health PT;Supervision/Assistance - 24 hour    Equipment Recommendations  Rolling walker with 5" wheels    Recommendations for Other Services       Precautions / Restrictions Precautions Precautions: None Restrictions Weight Bearing Restrictions: No      Mobility  Bed Mobility Overal bed mobility: Modified Independent                Transfers Overall transfer level: Modified independent                  Ambulation/Gait Ambulation/Gait assistance: Supervision;Min guard Ambulation Distance (Feet): 175 Feet Assistive device: 1 person hand held assist Gait Pattern/deviations: Step-through pattern;Wide base of support;Drifts right/left Gait velocity: reduced Gait velocity interpretation: Below normal speed for age/gender General Gait Details: wide base gait for stability with a tendency to list to L  Stairs            Wheelchair Mobility    Modified Rankin (Stroke Patients Only) Modified Rankin (Stroke Patients Only) Pre-Morbid Rankin Score: Slight disability Modified Rankin: Moderate disability     Balance Overall balance assessment:  No apparent balance deficits (not formally assessed)   Sitting balance-Leahy Scale: Good       Standing balance-Leahy Scale: Fair                               Pertinent Vitals/Pain Pain Assessment: No/denies pain Faces Pain Scale: Hurts a little bit Pain Location: head Pain Descriptors / Indicators: Aching Pain Intervention(s): Limited activity within patient's tolerance    Home Living Family/patient expects to be discharged to:: Private residence Living Arrangements: Spouse/significant other Available Help at Discharge: Family Type of Home: House Home Access: Stairs to enter Entrance Stairs-Rails: None Entrance Stairs-Number of Steps: 2+2 Home Layout: Two level;Able to live on main level with bedroom/bathroom Home Equipment: Grab bars - tub/shower;Cane - single point Additional Comments: husband present and moving about room without difficulty    Prior Function Level of Independence: Independent         Comments: uses shopping cart for stability at store     Hand Dominance   Dominant Hand: Right    Extremity/Trunk Assessment   Upper Extremity Assessment: Overall WFL for tasks assessed           Lower Extremity Assessment: Generalized weakness      Cervical / Trunk Assessment: Normal  Communication   Communication: No difficulties  Cognition Arousal/Alertness: Awake/alert Behavior During Therapy: WFL for tasks assessed/performed Overall Cognitive Status: Within Functional Limits for tasks assessed  General Comments General comments (skin integrity, edema, etc.): Pt is becoming more steady with gait and transfers with practice but did have low endurance and limited balance walking, needed contact to be steady with all turns and avoidance of obstacles.    Exercises        Assessment/Plan    PT Assessment Patient needs continued PT services  PT Diagnosis Difficulty walking;Generalized weakness   PT  Problem List Decreased strength;Decreased range of motion;Decreased activity tolerance;Decreased balance;Decreased mobility;Decreased coordination;Decreased knowledge of use of DME;Cardiopulmonary status limiting activity;Obesity  PT Treatment Interventions DME instruction;Gait training;Stair training;Functional mobility training;Therapeutic activities;Therapeutic exercise;Balance training;Neuromuscular re-education;Patient/family education   PT Goals (Current goals can be found in the Care Plan section) Acute Rehab PT Goals Patient Stated Goal: to get back to normal PT Goal Formulation: With patient/family Time For Goal Achievement: 04/29/15 Potential to Achieve Goals: Good    Frequency Min 3X/week   Barriers to discharge Inaccessible home environment (stairs with no rails throughout the house)      Co-evaluation               End of Session Equipment Utilized During Treatment: Gait belt Activity Tolerance: Patient tolerated treatment well;Patient limited by fatigue Patient left: in bed;with call bell/phone within reach;with bed alarm set;with family/visitor present Nurse Communication: Mobility status    Functional Assessment Tool Used: clinicla judgement Functional Limitation: Mobility: Walking and moving around Mobility: Walking and Moving Around Current Status [id number]): At least 20 percent but less than 40 percent impaired, limited or restricted Mobility: Walking and Moving Around Goal Status [phone]): At least 20 percent but less than 40 percent impaired, limited or restricted    Time: 0822-0847 PT Time Calculation (min) (ACUTE ONLY): 25 min   Charges:   PT Evaluation $PT Eval Moderate Complexity: 1 Procedure PT Treatments $Gait Training: 8-22 mins   PT G Codes:   PT G-Codes **NOT FOR INPATIENT CLASS** Functional Assessment Tool Used: clinicla judgement Functional Limitation: Mobility: Walking and moving around Mobility: Walking and Moving Around Current Status  [id number]): At least 20 percent but less than 40 percent impaired, limited or restricted Mobility: Walking and Moving Around Goal Status [phone]): At least 20 percent but less than 40 percent impaired, limited or restricted    Ramond Dial 04/15/2015, 1:55 PM   Mee Hives, PT MS Acute Rehab Dept. Number: [facility] [id number] and [place] [phone]

## 2015-04-16 DIAGNOSIS — G459 Transient cerebral ischemic attack, unspecified: Secondary | ICD-10-CM

## 2015-04-16 LAB — HEMOGLOBIN A1C
Hgb A1c MFr Bld: 9.7 % — ABNORMAL HIGH (ref 4.8–5.6)
Mean Plasma Glucose: 232 mg/dL

## 2015-04-16 LAB — GLUCOSE, CAPILLARY
Glucose-Capillary: 179 mg/dL — ABNORMAL HIGH (ref 65–99)
Glucose-Capillary: 200 mg/dL — ABNORMAL HIGH (ref 65–99)

## 2015-04-16 MED ORDER — METFORMIN HCL 500 MG PO TABS
1000.0000 mg | ORAL_TABLET | Freq: Two times a day (BID) | ORAL | Status: DC
  Administered 2015-04-16: 1000 mg via ORAL
  Filled 2015-04-16 (×2): qty 2

## 2015-04-16 MED ORDER — GABAPENTIN 300 MG PO CAPS: 600.0000 mg | ORAL_CAPSULE | Freq: Two times a day (BID) | ORAL | Status: DC

## 2015-04-16 MED ORDER — METFORMIN HCL 500 MG PO TABS: ORAL_TABLET | ORAL | Status: DC

## 2015-04-16 NOTE — Procedures (Signed)
ELECTROENCEPHALOGRAM REPORT  Patient: Sherry [name]       Room #: [id number] EEG No. ID: [id number] Age: 65 y.o.        Sex: female Referring Physician: Princella Ion Report Date:  04/16/2015        Interpreting Physician: Anthony Sar  History: Sherry [name] is an 65 y.o. female with a history of TIA in October 2016 and known intracranial aneurysm presenting with dense left facial numbness and visual changes, possible manifestations of recurrent TIA.  Indications for study:  Rule out focal seizure disorder.  Technique: This is an 18 channel routine scalp EEG performed at the bedside with bipolar and monopolar montages arranged in accordance to the international 10/20 system of electrode placement.   Description: This EEG recording was performed during wakefulness. Predominant background activity consisted of 9 Hz symmetrical alpha rhythm which attenuated with eye opening. Photic stimulation was not performed. No epileptiform discharges were recorded. There was no abnormal slowing of cerebral activity.  Interpretation: This is a normal EEG recording. No evidence of an epileptic disorder nor cephalopathic state was demonstrated.   Rush Farmer M.D. [facility] [phone]

## 2015-04-16 NOTE — Progress Notes (Signed)
04/16/2015 12:49 PM  Patient expressed concern about an area on her right elbow. Assessed. Small pimple type bump was noted. Provided patient with a 2X2 foam for elbow. Will inform attending RN Lurena Joiner about concern.   [employer], RN-BC, [employer] [facility] Phone [phone]

## 2015-04-16 NOTE — Care Management Note (Signed)
Case Management Note  Patient Details  Name: Sherry [name] MRN: [id number] Date of Birth: [birth year]  Subjective/Objective:            CM following for progression and d/c planning.        Action/Plan: 04/16/2015 Met with pt to discuss medication needs. Pt has been active with [facility] and receiving meds from [facility]. However to do this she must have scripts called monthly by [facility] to [facility] . This is not happening in a timely manner even with her calling twice per month. Due to this delay she has ended up back in the hospital as she has not been able to get her medication. 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 She is very proactive in obtaining meds and has applied for Medicaid however does not qualify due to her retirement income being "a few dollars over the limit". Per the pt her house payment takes most of her income. Pt has family, however they are limited in how much support they are able to provide. Pt would like to change PCP provider to [facility] so she may apply for an [facility] card and use that pharmacy. This CM scheduled the pt for a followup appointment on April 22, 2015. Will provide a MATCH letter to assist with 30 days of meds if the pt qualifies and if she has multiple prescriptions.   Expected Discharge Date:                  Expected Discharge Plan:  Home/Self Care  In-House Referral:  NA  Discharge planning Services  CM Consult, [facility], Medication Assistance  Post Acute Care Choice:    Choice offered to:     DME Arranged:    DME Agency:     HH Arranged:    HH Agency:     Status of Service:  In process, will continue to follow  Medicare Important Message Given:    Date Medicare IM Given:    Medicare IM give by:    Date Additional Medicare IM Given:    Additional Medicare Important Message give by:     If discussed at [place] of Stay Meetings, dates  discussed:    Additional Comments:  Adron Bene, RN 04/16/2015, 12:42 PM

## 2015-04-16 NOTE — Discharge Summary (Signed)
Physician Discharge Summary  Sherry [name] [id number] DOB: [birth year] DOA: 04/14/2015  PCP: Sherry Kanner, NP  Admit date: 04/14/2015 Discharge date: 04/16/2015  Time spent: 35 minutes  Recommendations for Outpatient Follow-up:  1. Patient needs to be restarted on her home regimen as she ran out of her meds-- Hgb A1C was 6 now 9--- restart previous regimen as patient had good control   Discharge Diagnoses:  Principal Problem:   TIA (transient ischemic attack) Active Problems:   Diabetes mellitus with complication [facility])   Essential hypertension   Aneurysm ([place])   Vertigo   Discharge Condition: improved  Diet recommendation: carb mod  Filed Weights   04/14/15 1138 04/14/15 1840  Weight: 93.441 kg (206 lb) 89.5 kg (197 lb 5 oz)    History of present illness:  Sherry [name] is a 65 y.o. 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 Patient denies any weakness of the extremities or difficulty swallowing speaking or any visual symptoms. Patient's left facial numbness improved by the time patient reached ER. Patient is a poor historian. CT of the head did not show anything acute and patient has been admitted for possible TIA. Patient states his symptoms of left eye drooping worsens when her sugar goes up.   Hospital Course:  Left face numbness, headache: MRI neg. Carotid dopplers, echo ok. LDL below 70. hgb a1c 12/16 6.6. Neuro recommends EEG, will order Active Problems:  Diabetes mellitus with complication ([place]), uncontrolled. Reports having run out of metformin a few days ago. Add lantus and increase SSI  Essential hypertension  Aneurysm ([place])  Procedures:    Consultations:  neurology  Discharge  Exam: Filed Vitals:   04/16/15 0633 04/16/15 1003  BP: 126/51 153/64  Pulse: 52 72  Temp: 98.3 F (36.8 C) 98.6 F (37 C)  Resp: 17 18    General: awake, NAD Cardiovascular: rrr Respiratory: clear  Discharge Instructions   Discharge Instructions    Diet - low sodium heart healthy    Complete by:  As directed      Diet Carb Modified    Complete by:  As directed      Discharge instructions    Complete by:  As directed   Be sure you have all of your diabetic medications--- repeat HgbA1C in 3 months     Increase activity slowly    Complete by:  As directed           Current Discharge Medication List    CONTINUE these medications which have CHANGED   Details  gabapentin (NEURONTIN) 300 MG capsule Take 2 capsules (600 mg total) by mouth 2 (two) times daily. Qty: 60 capsule, Refills: 5   Associated Diagnoses: Diabetes mellitus type 2 with complications (HCC)    metFORMIN (GLUCOPHAGE) 500 MG tablet TAKE 2 TABLETS(1000 MG) BY MOUTH TWICE DAILY WITH A MEAL Qty: 120 tablet, Refills: 0      CONTINUE these medications which have NOT CHANGED   Details  albuterol (PROVENTIL HFA;VENTOLIN HFA) 108 (90 BASE) MCG/ACT inhaler Inhale 2 puffs into the lungs every 6 (six) hours as needed. For shortness of breath.    albuterol (PROVENTIL) (2.5 MG/3ML) 0.083% nebulizer solution Take 3 mLs (2.5 mg total) by nebulization every 6 (six) hours  as needed for wheezing or shortness of breath. Qty: 150 mL, Refills: 1    aspirin 81 MG EC tablet Take 4 tablets (325 mg total) by mouth daily. Qty: 30 tablet, Refills: 0    atorvastatin (LIPITOR) 80 MG tablet Take 0.5 tablets (40 mg total) by mouth daily. Qty: 100 tablet, Refills: 1    cetirizine (ZYRTEC) 10 MG tablet Take 10 mg by mouth daily.    docusate sodium (COLACE) 100 MG capsule Take 100 mg by mouth daily.    empagliflozin (JARDIANCE) 10 MG TABS tablet Take 5 mg by mouth daily. Qty: 15 tablet, Refills: 11    furosemide (LASIX) 20 MG  tablet TAKE 1 TABLET(20 MG) BY MOUTH DAILY Qty: 30 tablet, Refills: 0    HYDROcodone-acetaminophen (NORCO/VICODIN) 5-325 MG tablet Take 1 tablet by mouth every 6 (six) hours as needed for moderate pain. Qty: 30 tablet, Refills: 0   Associated Diagnoses: Hip pain, chronic, right    ipratropium (ATROVENT) 0.03 % nasal spray Place 2 sprays into the nose 4 (four) times daily. Qty: 30 mL, Refills: 6   Associated Diagnoses: Post-nasal drainage    linagliptin (TRADJENTA) 5 MG TABS tablet Take 0.5 tablets (2.5 mg total) by mouth daily. Qty: 15 tablet, Refills: 11    lisinopril (PRINIVIL,ZESTRIL) 20 MG tablet TAKE 1 TABLET BY MOUTH DAILY Qty: 30 tablet, Refills: 0    metoprolol tartrate (LOPRESSOR) 25 MG tablet Take 1 tablet (25 mg total) by mouth 2 (two) times daily. Qty: 60 tablet, Refills: 5   Associated Diagnoses: Essential hypertension    mometasone (ASMANEX) 220 MCG/INH inhaler Use 1 puff daily followed by rinsing your mouth out with water. Qty: 1 Inhaler, Refills: 12    omeprazole (PRILOSEC) 20 MG capsule Take 20 mg by mouth daily.     repaglinide (PRANDIN) 2 MG tablet Take 1 tablet (2 mg total) by mouth 2 (two) times daily before a meal. Qty: 90 tablet, Refills: 11    sertraline (ZOLOFT) 50 MG tablet Take 1 tablet (50 mg total) by mouth at bedtime. Qty: 30 tablet, Refills: 0   Associated Diagnoses: Major depressive disorder, single episode, mild (HCC)       Allergies  Allergen Reactions  . Influenza Vaccines Anaphylaxis  . Codeine Itching  . Demerol Other (See Comments)    SEIZURE  . Nsaids Other (See Comments)    NAPROXEN, IBUPROFEN, SKELAXIN---  CAUSES PALPITATIONS  . Prednisone     Caused her glucose to go up to 500 and went to ED  . Tramadol     Contraindicated with Victoza    Follow-up Information    Follow up with Sherry H, NP In 1 week.   Contact information:   [place] [id number] [phone]        The results of significant  diagnostics from this hospitalization (including imaging, microbiology, ancillary and laboratory) are listed below for reference.    Significant Diagnostic Studies: Ct Head Wo Contrast  04/14/2015  CLINICAL DATA:  Patient with dizziness. Left facial weakness. History of prior stroke. EXAM: CT HEAD WITHOUT CONTRAST TECHNIQUE: Contiguous axial images were obtained from the base of the skull through the vertex without intravenous contrast. COMPARISON:  Brain CT 11/14/2014 FINDINGS: Ventricles and sulci are appropriate for patient's age. No evidence for acute cortically based infarct, intracranial hemorrhage, mass lesion or mass-effect. Orbits are unremarkable. Paranasal sinuses are well aerated. Mastoid air cells unremarkable. Calvarium is intact. IMPRESSION: No acute intracranial process. Electronically Signed   By:  Lovey Newcomer M.D.   On: 04/14/2015 12:50   Mr Brain Wo Contrast  04/15/2015  CLINICAL DATA:  Initial evaluation for acute dizziness, weakness on left side of face. EXAM: MRI HEAD WITHOUT CONTRAST MRA HEAD WITHOUT CONTRAST TECHNIQUE: Multiplanar, multiecho pulse sequences of the brain and surrounding structures were obtained without intravenous contrast. Angiographic images of the head were obtained using MRA technique without contrast. COMPARISON:  Prior CT from 04/14/2015 as well as previous MRI from 11/11/2015 and MRA from 11/12/2015. FINDINGS: MRI HEAD FINDINGS Cerebral volume within normal limits for patient age. No focal parenchymal signal abnormality identified. No significant white matter disease. No abnormal foci of restricted diffusion to suggest acute intracranial infarct. Gray-white matter differentiation maintained. Major intracranial vascular flow voids preserved. No acute or chronic intracranial hemorrhage. No areas of chronic infarction. No mass lesion, midline shift, or mass effect. No hydrocephalus. No extra-axial fluid collection. Craniocervical junction within normal limits. Mild  degenerative spondylolysis within the upper cervical spine without significant stenosis. Pituitary gland normal.  No acute abnormality about the orbits. Small retention cyst within the right maxilla sinus. Paranasal sinuses are otherwise largely clear. No mastoid effusion. Inner ear structures within normal limits. Bone marrow signal intensity within normal limits. Hyperostosis frontalis interna noted. No scalp soft tissue abnormality. MRA HEAD FINDINGS ANTERIOR CIRCULATION: Visualized distal cervical segments of the internal carotid arteries are patent with antegrade flow. Petrous, cavernous, and supraclinoid segments are patent without high-grade flow-limiting stenosis. Again seen is a focal aneurysm arising from the proximal cavernous right ICA measuring approximately 5 x 2 mm. This is directed laterally, inferiorly, and posteriorly, and likely extradural in location. This is not significantly changed relative to prior. Right A1 segment patent. Left A1 segment absent. Anterior communicating artery and anterior cerebral arteries well opacified. Left M1 segment widely patent without stenosis or occlusion. Left MCA bifurcation normal. Mild distal branch atheromatous irregularity within the distal left MCA branches. Proximal right M1 segment tortuous with mild stenosis. No high-grade stenosis. Right MCA bifurcation normal. Distal branch atheromatous irregularity within the right MCA branches. POSTERIOR CIRCULATION: Vertebral arteries are patent to the vertebrobasilar junction. Posterior inferior cerebral arteries patent. Basilar artery widely patent. Superior cerebellar arteries patent bilaterally. Fetal origin of the left PCA supplied via a widely patent left posterior communicating artery. Right PCA arises from the basilar artery, although a small right posterior communicating artery noted as well. Multi focal atheromatous irregularity within the PCAs bilaterally without proximal high-grade stenosis. PCAs are  opacified to their distal aspect. IMPRESSION: MRI HEAD IMPRESSION: Normal brain MRI. No acute intracranial infarct or other abnormality identified. MRA HEAD IMPRESSION: 1. Stable intracranial MRA. No large or proximal arterial branch occlusion. 2. Mild branch vessel atherosclerotic changes without proximal severe or correctable stenosis. 3. 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 Electronically Signed   By: Jeannine Boga M.D.   On: 04/15/2015 06:28   Mr Jodene Nam Head/brain Wo Cm  04/15/2015  CLINICAL DATA:  Initial evaluation for acute dizziness, weakness on left side of face. EXAM: MRI HEAD WITHOUT CONTRAST MRA HEAD WITHOUT CONTRAST TECHNIQUE: Multiplanar, multiecho pulse sequences of the brain and surrounding structures were obtained without intravenous contrast. Angiographic images of the head were obtained using MRA technique without contrast. COMPARISON:  Prior CT from 04/14/2015 as well as previous MRI from 11/11/2015 and MRA from 11/12/2015. FINDINGS: MRI HEAD FINDINGS Cerebral volume within normal limits for patient age. No focal parenchymal signal abnormality identified. No significant white matter disease. No abnormal foci of restricted  diffusion to suggest acute intracranial infarct. Gray-white matter differentiation maintained. Major intracranial vascular flow voids preserved. No acute or chronic intracranial hemorrhage. No areas of chronic infarction. No mass lesion, midline shift, or mass effect. No hydrocephalus. No extra-axial fluid collection. Craniocervical junction within normal limits. Mild degenerative spondylolysis within the upper cervical spine without significant stenosis. Pituitary gland normal.  No acute abnormality about the orbits. Small retention cyst within the right maxilla sinus. Paranasal sinuses are otherwise largely clear. No mastoid effusion. Inner ear structures within normal limits. Bone marrow signal intensity within normal limits. Hyperostosis frontalis interna noted.  No scalp soft tissue abnormality. MRA HEAD FINDINGS ANTERIOR CIRCULATION: Visualized distal cervical segments of the internal carotid arteries are patent with antegrade flow. Petrous, cavernous, and supraclinoid segments are patent without high-grade flow-limiting stenosis. Again seen is a focal aneurysm arising from the proximal cavernous right ICA measuring approximately 5 x 2 mm. This is directed laterally, inferiorly, and posteriorly, and likely extradural in location. This is not significantly changed relative to prior. Right A1 segment patent. Left A1 segment absent. Anterior communicating artery and anterior cerebral arteries well opacified. Left M1 segment widely patent without stenosis or occlusion. Left MCA bifurcation normal. Mild distal branch atheromatous irregularity within the distal left MCA branches. Proximal right M1 segment tortuous with mild stenosis. No high-grade stenosis. Right MCA bifurcation normal. Distal branch atheromatous irregularity within the right MCA branches. POSTERIOR CIRCULATION: Vertebral arteries are patent to the vertebrobasilar junction. Posterior inferior cerebral arteries patent. Basilar artery widely patent. Superior cerebellar arteries patent bilaterally. Fetal origin of the left PCA supplied via a widely patent left posterior communicating artery. Right PCA arises from the basilar artery, although a small right posterior communicating artery noted as well. Multi focal atheromatous irregularity within the PCAs bilaterally without proximal high-grade stenosis. PCAs are opacified to their distal aspect. IMPRESSION: MRI HEAD IMPRESSION: Normal brain MRI. No acute intracranial infarct or other abnormality identified. MRA HEAD IMPRESSION: 1. Stable intracranial MRA. No large or proximal arterial branch occlusion. 2. Mild branch vessel atherosclerotic changes without proximal severe or correctable stenosis. 3. 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 Electronically Signed    By: Jeannine Boga M.D.   On: 04/15/2015 06:28    Microbiology: No results found for this or any previous visit (from the past 240 hour(s)).   Labs: Basic Metabolic Panel:  Recent Labs Lab 04/14/15 1205 04/14/15 2141 04/15/15 0644  NA 142  --  139  K 3.9  --  4.6  CL 104  --  106  CO2 26  --  24  GLUCOSE 293*  --  399*  BUN 17  --  18  CREATININE 0.75 0.76 0.70  CALCIUM 9.0  --  9.1   Liver Function Tests:  Recent Labs Lab 04/14/15 1205 04/15/15 0644  AST 18 21  ALT 22 16  ALKPHOS 85 87  BILITOT 0.9 0.9  PROT 6.9 6.0*  ALBUMIN 4.2 3.4*   No results for input(s): LIPASE, AMYLASE in the last 168 hours. No results for input(s): AMMONIA in the last 168 hours. CBC:  Recent Labs Lab 04/14/15 1205 04/14/15 2141 04/15/15 0644  WBC 7.8 7.6 6.0  NEUTROABS 4.3  --   --   HGB 13.5 13.0 13.1  HCT 41.8 40.1 40.8  MCV 88.9 89.7 90.1  PLT 268 269 269   Cardiac Enzymes: No results for input(s): CKTOTAL, CKMB, CKMBINDEX, TROPONINI in the last 168 hours. BNP: BNP (last 3 results) No results for input(s): BNP  in the last 8760 hours.  ProBNP (last 3 results) No results for input(s): PROBNP in the last 8760 hours.  CBG:  Recent Labs Lab 04/15/15 1108 04/15/15 1721 04/15/15 2126 04/15/15 2328 04/16/15 0728  GLUCAP 334* 273* 127* 107* 179*       Signed:  Akio Hudnall U Mckenize Mezera DO.  [facility] 04/16/2015, 10:52 AM

## 2015-04-16 NOTE — Progress Notes (Signed)
Occupational Therapy Treatment Patient Details Name: Sherry [name] MRN: [id number] DOB: 12-06-1950 Today's Date: 04/16/2015    History of present illness 65 y.o. 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 Presents to ER with left-sided facial numbness and left eye drooping since waking up 3.5.2017. MRI negative for CVA    OT comments  Patient is pretty close to baseline with vision and ADLs. Pt required min verbal cueing and is supervision due to vision. Pt overall mod I with functional tranfers and ambulation. Will continue to keep patient on caseload for continued education on low vision as appropriate and prn, pt reports that her eyes just feel tired but are better than yesterday. Encouraged pt to go see her eye doctor once discharged from hospital.     Follow Up Recommendations  No OT follow up;Supervision - Intermittent    Equipment Recommendations  None recommended by OT    Recommendations for Other Services  None at this time   Precautions / Restrictions Precautions Precautions: None Restrictions Weight Bearing Restrictions: No    Mobility Bed Mobility Overal bed mobility: Modified Independent  Transfers Overall transfer level: Modified independent General transfer comment: extra time, used RW upon standing for support    Balance Overall balance assessment: No apparent balance deficits (not formally assessed)   ADL Overall ADL's : Needs assistance/impaired Functional mobility during ADLs: Supervision/safety General ADL Comments: overall supervision with ADL. No set-up required, pt able to complete set-up herself. Pt very close to being mod I      Vision Eye Alignment: Within Functional Limits Alignment/Gaze Preference: Within Defined Limits Ocular Range of Motion: Within Functional  Limits Tracking/Visual Pursuits: Able to track stimulus in all quads without difficulty Saccades: Within functional limits Convergence: Within functional limits Additional Comments: Pt reports vision is better, "my eyes just feel tired".    Perception Perception Perception Tested?: Yes Comments: WFL   Praxis Praxis Praxis tested?: Within functional limits    Cognition   Behavior During Therapy: WFL for tasks assessed/performed Overall Cognitive Status: Within Functional Limits for tasks assessed                   Pertinent Vitals/ Pain       Pain Assessment: 0-10 Faces Pain Scale: Hurts a little bit (pt putting hand on head, around left eye) Pain Location: head Pain Descriptors / Indicators: Aching Pain Intervention(s): Monitored during session   Frequency Min 2X/week     Progress Toward Goals  OT Goals(current goals can now befound in the care plan section)  Progress towards OT goals: Progressing toward goals  Acute Rehab OT Goals Patient Stated Goal: go home today OT Goal Formulation: With patient Time For Goal Achievement: 04/22/15 Potential to Achieve Goals: Good  Plan Discharge plan remains appropriate    End of Session Equipment Utilized During Treatment: Gait belt;Rolling walker   Activity Tolerance Patient tolerated treatment well   Patient Left in bed;with call bell/phone within reach    Time: 1400-1419 OT Time Calculation (min): 19 min  Charges: OT General Charges $OT Visit: 1 Procedure OT Treatments $Self Care/Home Management : 8-22 mins  Chrys Racer , MS, OTR/L, CLT Pager: [phone]  04/16/2015, 2:47 PM

## 2015-04-16 NOTE — Progress Notes (Signed)
Physical Therapy Treatment Patient Details Name: Leelah Barnhard MRN: [id number] DOB: 1950/06/29 Today's Date: 04/16/2015    History of Present Illness 65 y.o. 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 Presents to ER with left-sided facial numbness and left eye drooping since waking up 3.5.2017. MRI negative for CVA     PT Comments    Feels more confident with RW for support. No loss of balance noted, no buckling observed. Safely navigates stairs with hand held assist from husband. Pt and husband both verbalize that they feel confident in patient's abilities and that they can manage ADLs and mobility safely at home. Pt will benefit from home safety evaluation from HHPT at discharge to further improve safety with mobility and independence following discharge.  Follow Up Recommendations  Home health PT;Supervision/Assistance - 24 hour (initially)     Equipment Recommendations  Rolling walker with 5" wheels    Recommendations for Other Services       Precautions / Restrictions Precautions Precautions: None Restrictions Weight Bearing Restrictions: No    Mobility  Bed Mobility Overal bed mobility: Modified Independent                Transfers Overall transfer level: Modified independent               General transfer comment: extra time, used RW upon standing for support  Ambulation/Gait Ambulation/Gait assistance: Supervision Ambulation Distance (Feet): 150 Feet Assistive device: Rolling walker (2 wheeled) Gait Pattern/deviations: Step-through pattern;Decreased stride length Gait velocity: decreased Gait velocity interpretation: Below normal speed for age/gender General Gait Details: Overall stable with a rolling walker for support. States she feels much more confident with this device  compared to practicing without device yesteray. No overt loss of balance or buckling noted. Good control of RW after education for safe DME use.   Stairs Stairs: Yes Stairs assistance: Min assist Stair Management: No rails;Step to pattern;Forwards Number of Stairs: 3 General stair comments: Husband present and actively participated in stair training. Educated both on safe sequencing and guard techniques for husband. Hand held support for balance due to no rails at home.  Wheelchair Mobility    Modified Rankin (Stroke Patients Only)       Balance                                    Cognition Arousal/Alertness: Awake/alert Behavior During Therapy: WFL for tasks assessed/performed Overall Cognitive Status: Within Functional Limits for tasks assessed                      Exercises      General Comments General comments (skin integrity, edema, etc.): Husband present, supportive. Both feel confident they can manage safely at home.      Pertinent Vitals/Pain Pain Assessment: No/denies pain Pain Location: head Pain Descriptors / Indicators:  ("not pain, just some feeling") Pain Intervention(s): Monitored during session    Home Living                      Prior Function            PT Goals (current goals can now be found in the care plan section) Acute Rehab PT Goals Patient Stated Goal: to  get back to normal PT Goal Formulation: With patient/family Time For Goal Achievement: 04/29/15 Potential to Achieve Goals: Good Progress towards PT goals: Progressing toward goals    Frequency  Min 3X/week    PT Plan Current plan remains appropriate    Co-evaluation             End of Session Equipment Utilized During Treatment: Gait belt Activity Tolerance: Patient tolerated treatment well Patient left: in bed;with call bell/phone within reach;with family/visitor present (MD evaluating pt in room)     Time: [id number] PT Time  Calculation (min) (ACUTE ONLY): 12 min  Charges:  $Gait Training: 8-22 mins                    G Codes:      Ellouise Newer 2015-05-14, 11:51 AM  Elayne Snare, [place]
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
## 2015-04-16 NOTE — Progress Notes (Signed)
Darci Needle to be D/C'd Home per MD order.  Discussed prescriptions and follow up appointments with the patient. Prescriptions given to patient, medication list explained in detail. Pt verbalized understanding.    Medication List    TAKE these medications        albuterol (2.5 MG/3ML) 0.083% nebulizer solution  Commonly known as:  PROVENTIL  Take 3 mLs (2.5 mg total) by nebulization every 6 (six) hours as needed for wheezing or shortness of breath.     albuterol 108 (90 Base) MCG/ACT inhaler  Commonly known as:  PROVENTIL HFA;VENTOLIN HFA  Inhale 2 puffs into the lungs every 6 (six) hours as needed. For shortness of breath.     aspirin 81 MG EC tablet  Take 4 tablets (325 mg total) by mouth daily.     atorvastatin 80 MG tablet  Commonly known as:  LIPITOR  Take 0.5 tablets (40 mg total) by mouth daily.     cetirizine 10 MG tablet  Commonly known as:  ZYRTEC  Take 10 mg by mouth daily.     docusate sodium 100 MG capsule  Commonly known as:  COLACE  Take 100 mg by mouth daily.     empagliflozin 10 MG Tabs tablet  Commonly known as:  JARDIANCE  Take 5 mg by mouth daily.     furosemide 20 MG tablet  Commonly known as:  LASIX  TAKE 1 TABLET(20 MG) BY MOUTH DAILY     gabapentin 300 MG capsule  Commonly known as:  NEURONTIN  Take 2 capsules (600 mg total) by mouth 2 (two) times daily.     HYDROcodone-acetaminophen 5-325 MG tablet  Commonly known as:  NORCO/VICODIN  Take 1 tablet by mouth every 6 (six) hours as needed for moderate pain.     ipratropium 0.03 % nasal spray  Commonly known as:  ATROVENT  Place 2 sprays into the nose 4 (four) times daily.     linagliptin 5 MG Tabs tablet  Commonly known as:  TRADJENTA  Take 0.5 tablets (2.5 mg total) by mouth daily.     lisinopril 20 MG tablet  Commonly known as:  PRINIVIL,ZESTRIL  TAKE 1 TABLET BY MOUTH DAILY     metFORMIN 500 MG tablet  Commonly known as:  GLUCOPHAGE  TAKE 2 TABLETS(1000 MG) BY MOUTH TWICE DAILY  WITH A MEAL     metoprolol tartrate 25 MG tablet  Commonly known as:  LOPRESSOR  Take 1 tablet (25 mg total) by mouth 2 (two) times daily.     mometasone 220 MCG/INH inhaler  Commonly known as:  ASMANEX  Use 1 puff daily followed by rinsing your mouth out with water.     omeprazole 20 MG capsule  Commonly known as:  PRILOSEC  Take 20 mg by mouth daily.     repaglinide 2 MG tablet  Commonly known as:  PRANDIN  Take 1 tablet (2 mg total) by mouth 2 (two) times daily before a meal.     sertraline 50 MG tablet  Commonly known as:  ZOLOFT  Take 1 tablet (50 mg total) by mouth at bedtime.        Filed Vitals:   04/16/15 0633 04/16/15 1003  BP: 126/51 153/64  Pulse: 52 72  Temp: 98.3 F (36.8 C) 98.6 F (37 C)  Resp: 17 18    Skin clean, dry and intact without evidence of skin break down, no evidence of skin tears noted. IV catheter discontinued intact. Site without signs and symptoms of  complications. Dressing and pressure applied. Pt denies pain at this time. No complaints noted. Patient has a copy of the stroke prevention booklet  An After Visit Summary was printed and given to the patient. Patient escorted via [place], and D/C home via private auto.  Retta Mac BSN, RN

## 2015-04-22 ENCOUNTER — Inpatient Hospital Stay: Payer: Self-pay | Admitting: Internal Medicine

## 2015-04-29 ENCOUNTER — Other Ambulatory Visit: Payer: Self-pay | Admitting: Licensed Clinical Social Worker

## 2015-05-01 ENCOUNTER — Other Ambulatory Visit (INDEPENDENT_AMBULATORY_CARE_PROVIDER_SITE_OTHER): Payer: Self-pay | Admitting: Licensed Clinical Social Worker

## 2015-05-01 DIAGNOSIS — F439 Reaction to severe stress, unspecified: Secondary | ICD-10-CM

## 2015-05-01 DIAGNOSIS — Z658 Other specified problems related to psychosocial circumstances: Secondary | ICD-10-CM

## 2015-05-01 NOTE — Progress Notes (Signed)
   THERAPY PROGRESS NOTE  Session Time: 68min  Participation Level: Active  Behavioral Response: Neat and Well GroomedAlertEuthymic  Type of Therapy: Individual Therapy  Treatment Goals addressed: Diagnosis: Assessment  Interventions: Supportive  Summary: Sherry [name] is a 65 y.o. female who presents with a euthymic mood and appropriate affect. She shared that she was seeking counseling at this time in her life due to stress and family problems. 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 She reported that she has 4 grown children, one of whom is deceased; her youngest son is currently in prison for robbing a bank. She shared that her relationship with her husband is not always good, especially since he has had multiple strokes over the past 5 years. She reported that she had a stroke in October 2016 and has had several "mini-strokes" since then, which are impacting her short-term memory. Sherry [name] shared that a major source of stress recently was having to put her 74 year old grandson into foster care after his negative behaviors became unmanageable in her home. She expressed her dismay and sadness that he had to go into foster care, but asserted that she could no longer "handle him." Sherry [name] shared about her childhood, when she had a great deal of responsibility over her younger 6 siblings. She shared about the death of one of her brothers when he was just 51 in a car accident. She shared that she does not have a very good relationship with her younger daughter. She expressed excitement to be in counseling.  Suicidal/Homicidal: Nowithout intent/plan  Therapist Response: LCSW began the clinical assessment but was unable to finish due to time constraints.LCSW utilized supportive counseling techniques throughout the session in order to validate emotions and encourage open expression of emotion. LCSW used reflections to build rapport with Sherry [name].  LCSW asked clarifying questions about Sherry [name]'s family. LCSW provided hope that counseling can support Sherry [name] and help her to cope with her stress.  Plan: Return again in 1 weeks.  Diagnosis: Axis I: See current hospital problem list    Axis II: No diagnosis    [name], LCSW 05/01/2015

## 2015-05-05 ENCOUNTER — Ambulatory Visit (INDEPENDENT_AMBULATORY_CARE_PROVIDER_SITE_OTHER): Payer: Self-pay | Admitting: Licensed Clinical Social Worker

## 2015-05-05 DIAGNOSIS — Z658 Other specified problems related to psychosocial circumstances: Secondary | ICD-10-CM

## 2015-05-05 DIAGNOSIS — F439 Reaction to severe stress, unspecified: Secondary | ICD-10-CM
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
## 2015-05-06 ENCOUNTER — Telehealth (HOSPITAL_COMMUNITY): Payer: Self-pay

## 2015-05-06 NOTE — Telephone Encounter (Signed)
Pt agreed to f/u in 1 year with a MRI with Dr. Estanislado Pandy. AW

## 2015-05-12 ENCOUNTER — Ambulatory Visit (INDEPENDENT_AMBULATORY_CARE_PROVIDER_SITE_OTHER): Payer: Self-pay | Admitting: Licensed Clinical Social Worker

## 2015-05-12 DIAGNOSIS — F439 Reaction to severe stress, unspecified: Secondary | ICD-10-CM

## 2015-05-12 DIAGNOSIS — Z658 Other specified problems related to psychosocial circumstances: Secondary | ICD-10-CM
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
## 2015-05-19 ENCOUNTER — Ambulatory Visit (INDEPENDENT_AMBULATORY_CARE_PROVIDER_SITE_OTHER): Payer: Self-pay | Admitting: Licensed Clinical Social Worker

## 2015-05-19 DIAGNOSIS — Z658 Other specified problems related to psychosocial circumstances: Secondary | ICD-10-CM

## 2015-05-19 DIAGNOSIS — F439 Reaction to severe stress, unspecified: Secondary | ICD-10-CM

## 2015-05-20 NOTE — Progress Notes (Signed)
   THERAPY PROGRESS NOTE  Session Time: 46min  Participation Level: Active  Behavioral Response: Neat and Well GroomedAlertEuthymic  Type of Therapy: Individual Therapy  Treatment Goals addressed: Coping  Interventions: Supportive  Summary: Rosarie Matuska is a 65 y.o. female who presents with a euthymic mood and appropriate affect. She reported that she continues to feel very stressed about multiple family problems. She expressed sadness and frustration that her grandson is still in jail; she reported that she is willing to have him back in her home once he is released. She shared about other family members that have been in prison or are currently incarcerated. Rayana shared at length about her frustrations towards her husband, who is "lazy and worthless." She reported that he is unwilling to work and seems unmotivated to do anything around the house. She shared that it is depressing to be around him. She shared that she does not think that she will be with him for much longer if he does not get motivated. Lasonya expressed that she has a high level of motivation for bettering herself as well as taking care of family members.  Suicidal/Homicidal: Nowithout intent/plan  Therapist Response: LCSW utilized supportive counseling techniques throughout the session in order to validate emotions and encourage open expression of emotion. LCSW and Tylah processed about her stressors and family dynamics. LCSW encouraged Avaline to think about ways that she can take care of herself as well as others.  Plan: Return again in 1 weeks.  Diagnosis: Axis I: See current hospital problem list    Axis II: No diagnosis    [name], LCSW 05/20/2015

## 2015-05-26 ENCOUNTER — Ambulatory Visit (INDEPENDENT_AMBULATORY_CARE_PROVIDER_SITE_OTHER): Payer: Self-pay | Admitting: Licensed Clinical Social Worker

## 2015-05-26 ENCOUNTER — Ambulatory Visit (INDEPENDENT_AMBULATORY_CARE_PROVIDER_SITE_OTHER): Payer: Self-pay | Admitting: Neurology

## 2015-05-26 ENCOUNTER — Encounter: Payer: Self-pay | Admitting: Neurology

## 2015-05-26 VITALS — BP 131/69 | HR 64 | Ht 62.0 in | Wt 198.4 lb

## 2015-05-26 DIAGNOSIS — I729 Aneurysm of unspecified site: Secondary | ICD-10-CM

## 2015-05-26 DIAGNOSIS — I10 Essential (primary) hypertension: Secondary | ICD-10-CM

## 2015-05-26 DIAGNOSIS — G451 Carotid artery syndrome (hemispheric): Secondary | ICD-10-CM

## 2015-05-26 DIAGNOSIS — E1159 Type 2 diabetes mellitus with other circulatory complications: Secondary | ICD-10-CM

## 2015-05-26 DIAGNOSIS — G459 Transient cerebral ischemic attack, unspecified: Secondary | ICD-10-CM

## 2015-05-26 DIAGNOSIS — F411 Generalized anxiety disorder: Secondary | ICD-10-CM

## 2015-05-26 DIAGNOSIS — E785 Hyperlipidemia, unspecified: Secondary | ICD-10-CM

## 2015-05-26 DIAGNOSIS — F331 Major depressive disorder, recurrent, moderate: Secondary | ICD-10-CM

## 2015-05-26 MED ORDER — ATORVASTATIN CALCIUM 10 MG PO TABS: 10.0000 mg | ORAL_TABLET | Freq: Every day | ORAL | Status: DC

## 2015-05-26 NOTE — Progress Notes (Signed)
STROKE NEUROLOGY FOLLOW UP NOTE  NAME: Sherry [name] DOB: [birth year]  REASON FOR VISIT: stroke follow up HISTORY FROM: pt and chart  Today we had the pleasure of seeing Sherry [name] in follow-up at our [facility]. Pt was accompanied by no one.   History Summary Sherry [name] is a 65 y.o. female with history of HTN, DM, HLD, MVP admitted on 11/11/14 for complaints of facial droop, decreased sensation left face as well as stabbing sensation in her left shoulder and arm pit after an argument with husband. She appeared to be anxious on exam. 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 Stroke work up with TTE and CUS unremarkable. LDL 78 and A1C 7.4. She was considered possible TIA due to multiple risk factors but also not to exclude from anxiety as the etiology. She was discharged with ASA and lipitor.   01/22/15 follow up - the patient has been doing well. No recurrent stroke like symptoms. Had seen Dr. 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 Pt optioned conservative treatment towards aneurysm so far and plan for another CTA in 6 months. Her glucose at home from 51-139 and BP in good control. Today BP 134/68.    Interval History During the interval time, she was admitted on 04/14/15 for left facial numbness and drooping. MRI no acute stroke, and MRA no LVO. CUS and TTE unremarkable. A1C 9.7 and LDL was -7. She admitted that she was under a lot of stress at that time including her 59 yo grandson made her crazy. She was continued on ASA and lipitor on discharge with diagnosis of possible TIA although anxiety more likely. Since then, she was doing fine. Still struggling with DM control. Today BP 131/69.   REVIEW OF SYSTEMS: Full 14 system review of systems performed and notable only for those listed below and in HPI above, all others are negative:    Constitutional:  fatigue Cardiovascular:  Ear/Nose/Throat:   Skin:  Eyes:   Respiratory:   Gastroitestinal:   Genitourinary:  Hematology/Lymphatic:   Endocrine:  Musculoskeletal:   Allergy/Immunology:   Neurological:   Psychiatric:  Sleep:   The following represents the patient's updated allergies and side effects list: Allergies  Allergen Reactions  . Influenza Vaccines Anaphylaxis  . Codeine Itching  . Demerol Other (See Comments)    SEIZURE  . Nsaids Other (See Comments)    NAPROXEN, IBUPROFEN, SKELAXIN---  CAUSES PALPITATIONS  . Prednisone     Caused her glucose to go up to 500 and went to ED  . Tramadol     Contraindicated with Victoza     The neurologically relevant items on the patient's problem list were reviewed on today's visit.  Neurologic Examination  A problem focused neurological exam (12 or more points of the single system neurologic examination, vital signs counts as 1 point, cranial nerves count for 8 points) was performed.  Blood pressure 131/69, pulse 64, height 5\' 2"  (1.575 m), weight 198 lb 6.4 oz (89.994 kg).  General - obses, well developed, in no apparent distress.  Ophthalmologic - Fundi not visualized due to eye movement.  Cardiovascular - Regular rate and rhythm.  Mental Status -  Level of arousal and orientation to time, place, and person were intact. Language including expression, naming, repetition, comprehension was assessed and found intact. Fund of Knowledge was assessed and was intact.  Cranial Nerves II -  XII - II - Visual field intact OU. III, IV, VI - Extraocular movements intact. V - Facial sensation intact bilaterally. VII - Facial movement intact bilaterally. VIII - Hearing & vestibular intact bilaterally. X - Palate elevates symmetrically. XI - Chin turning & shoulder shrug intact bilaterally. XII - Tongue protrusion intact.  Motor Strength - The patient's strength was normal in all extremities and pronator drift  was absent.  Bulk was normal and fasciculations were absent.   Motor Tone - Muscle tone was assessed at the neck and appendages and was normal.  Reflexes - The patient's reflexes were 1+ in all extremities and she had no pathological reflexes.  Sensory - Light touch, temperature/pinprick were assessed and were normal.    Coordination - The patient had normal movements in the hands and feet with no ataxia or dysmetria.  Tremor was absent.  Gait and Station - broad based gait due to obesity.  Data reviewed: I personally reviewed the images and agree with the radiology interpretations.  Ct Head Wo Contrast 11/11/2014 Stable head CT. No acute intracranial findings or explanation for the patient's symptoms.   Mr Brain Wo Contrast 11/11/2014 Normal examination.   Mr Jodene Nam Head/brain Wo Cm 11/12/2014 1. Mild branch vessel atherosclerotic changes without evidence of medium or large vessel occlusion or significant proximal stenosis. 2. 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   Carotid Doppler There is 1-39% bilateral ICA stenosis. Vertebral artery flow is antegrade.   2D Echocardiogram  - Left ventricle: The cavity size was normal. Systolic function was normal. The estimated ejection fraction was in the range of 60%to 65%. Wall motion was normal; there were no regional wallmotion abnormalities. There was no evidence of elevatedventricular filling pressure by Doppler parameters. - Mitral valve: Calcified annulus. Impressions: No cardiac source of emboli was indentified.  2D echo 04/15/15 - Left ventricle: The cavity size was normal. Wall thickness was  normal. Systolic function was normal. The estimated ejection  fraction was in the range of 60% to 65%. Wall motion was normal;  there were no regional wall motion abnormalities.  CUS - There is no obvious evidence of hemodyanamically significant internal carotid artery stenosis. Vertebral arteries are  patent with antegrade flow.  Cerebral angio 11/18/14 -  Approximately 75% stenosis of the right middle cerebral artery M1 segment with mild post stenotic dilatation. Approximately 7 x 4.1 mm saccular wide neck aneurysm arising from the caval cavernous segment of the right internal carotid artery.  MRI and MRA brain 04/15/15 Normal brain MRI. No acute intracranial infarct or other abnormality identified. MRA head showed 1. Stable intracranial MRA. No large or proximal arterial branch occlusion. 2. Mild branch vessel atherosclerotic changes without proximal severe or correctable stenosis. 3. 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  Component     Latest Ref Rng 11/12/2014 12/02/2014 01/13/2015  Cholesterol     0 - 200 mg/dL 150    Triglycerides     <150 mg/dL 66    HDL Cholesterol     >40 mg/dL 59    Total CHOL/HDL Ratio      2.5    VLDL     0 - 40 mg/dL 13    LDL (calc)     0 - 99 mg/dL 78    Hemoglobin A1C     4.8 - 5.6 % 7.4 (H)    Mean Plasma Glucose      166    Free T4     0.60 -  1.60 ng/dL  1.19 0.74  TSH     0.35 - 4.50 uIU/mL  0.23 (L) 1.08   Component     Latest Ref Rng 04/15/2015  Cholesterol     0 - 200 mg/dL 97  Triglycerides     <150 mg/dL 285 (H)  HDL Cholesterol     >40 mg/dL 47  Total CHOL/HDL Ratio      2.1  VLDL     0 - 40 mg/dL 57 (H)  LDL (calc)     0 - 99 mg/dL NEG 7  Hemoglobin A1C     4.8 - 5.6 % 9.7 (H)  Mean Plasma Glucose      232    Assessment: As you may recall, she is a 65 y.o. African American female with PMH of HTN, DM, HLD, MVP admitted on 11/11/14 for complaints of facial droop, decreased sensation left face as well as stabbing sensation in her left shoulder and arm pit after an argument with husband. MRI showed no acute infarct and MRA showed 27mm right cavernous ICA aneurysm. Stroke work up with TTE and CUS unremarkable. LDL 78 and A1C 7.4. She was considered possible TIA due to multiple risk factors but also not to exclude from  anxiety as the etiology. She was discharged with ASA and lipitor. During the interval time, she saw Dr. 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 Pt optioned conservative treatment towards aneurysm so far. 04/2015 was admitted for possible right brain TIA vs. Anxiety. A1C 9.7 and LDL too low at -7.     Plan:  - continue ASA and lipitor for stroke prevention - continue to follow up with Dr. Estanislado Pandy for brain aneurysm monitoring - will decrease lipitor to 10mg  daily - Follow up with your primary care physician for stroke risk factor modification. Recommend maintain blood pressure goal <130/80, diabetes with hemoglobin A1c goal below 6.5% and lipids with LDL cholesterol goal below 70 mg/dL.  - check BP and glucose and record and bring over to PCP for medication adjustment if needed - relaxation and cope with stress - follow up with eye doctor for vision change - healthy diet and regular exercise - follow up in 6 months.  I spent more than 25 minutes of face to face time with the patient. Greater than 50% of time was spent in counseling and coordination of care. We have discussed about aneurysm monitoring, and stroke risk factor modification.   No orders of the defined types were placed in this encounter.    Meds ordered this encounter  Medications  . QUEtiapine (SEROQUEL) 100 MG tablet    Sig: Take 25 mg by mouth at bedtime.  Marland Kitchen DISCONTD: atorvastatin (LIPITOR) 10 MG tablet    Sig: Take 1 tablet (10 mg total) by mouth daily.    Dispense:  90 tablet    Refill:  3  . atorvastatin (LIPITOR) 10 MG tablet    Sig: Take 1 tablet (10 mg total) by mouth daily.    Dispense:  90 tablet    Refill:  3    Patient Instructions  - continue ASA and lipitor for stroke prevention - continue to follow up with Dr. Estanislado Pandy for brain aneurysm - will decrease lipitor to 10mg  daily as your cholesterol is low. -  Follow up with your primary care physician for stroke risk factor modification. Recommend maintain blood pressure goal <130/80, diabetes with hemoglobin A1c goal  below 6.5% and lipids with LDL cholesterol goal below 70 mg/dL.  - check BP and glucose and record and bring over to PCP for medication adjustment if needed - relaxation and cope with stress - follow up with eye doctor for vision change - healthy diet and regular exercise - follow up in 6 months.    Rosalin Hawking, MD PhD [facility] [address], [place], [place] [address] [phone]

## 2015-05-26 NOTE — Patient Instructions (Addendum)
-   continue ASA and lipitor for stroke prevention - continue to follow up with Dr. Estanislado Pandy for brain aneurysm - will decrease lipitor to 10mg  daily as your cholesterol is low. - Follow up with your primary care physician for stroke risk factor modification. Recommend maintain blood pressure goal <130/80, diabetes with hemoglobin A1c goal below 6.5% and lipids with LDL cholesterol goal below 70 mg/dL.  - check BP and glucose and record and bring over to PCP for medication adjustment if needed - relaxation and cope with stress - follow up with eye doctor for vision change - healthy diet and regular exercise - follow up in 6 months.
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
## 2015-06-02 ENCOUNTER — Ambulatory Visit (INDEPENDENT_AMBULATORY_CARE_PROVIDER_SITE_OTHER): Payer: Self-pay | Admitting: Licensed Clinical Social Worker

## 2015-06-02 DIAGNOSIS — F331 Major depressive disorder, recurrent, moderate: Secondary | ICD-10-CM

## 2015-06-02 NOTE — Progress Notes (Signed)
   THERAPY PROGRESS NOTE  Session Time: 49min  Participation Level: Active  Behavioral Response: Casual and Fairly GroomedAlertDepressed  Type of Therapy: Individual Therapy  Treatment Goals addressed: Coping  Interventions: Supportive  Summary: Sherry [name] is a 65 y.o. female who presents with a slightly depressed mood and appropriate affect. She reported that her main stressor currently is her husband, who continues to be "an irritant." She shared that on Saturday, she got upset with him and asked him to move out. He left the house for a few hours but then came home and they have been ignoring each other since. Sherry [name] shared about the ways that her husband irritates and frustrates her. She stated that she has been caring for others all of her life but now there is no one who cares for her. She reported that her goals for counseling are to work on her confidence in being able to make decisions, improving her self-esteem, and increasing her ability to manage her anger. She shared that when she gets angry, she gets out of control. She rated herself at a 5 out of 10 for confidence/self-esteem, and a 2 out of 10 for anger control. She reported that she would also like to consider couples counseling and/or family counseling once her grandson is out of jail.   Suicidal/Homicidal: Nowithout intent/plan  Therapist Response: LCSW utilized supportive counseling techniques throughout the session in order to validate emotions and encourage open expression of emotion. LCSW and Sherry [name] processed about her goals for counseling in order to create her treatment plan. LCSW guided Sherry [name] in clarifying and prioritizing her goals. LCSW encouraged Sherry [name] to find another counselor specifically for couples counseling so that she would have an objective counselor.  Plan: Return again in 1 weeks.  Diagnosis: Axis I: See current hospital problem list    Axis II: No diagnosis    Sherry Clines,  LCSW 06/02/2015

## 2015-06-03 ENCOUNTER — Telehealth: Payer: Self-pay | Admitting: *Deleted

## 2015-06-03 NOTE — Telephone Encounter (Signed)
Unable to reach by phone

## 2015-06-09 ENCOUNTER — Ambulatory Visit (INDEPENDENT_AMBULATORY_CARE_PROVIDER_SITE_OTHER): Payer: Self-pay | Admitting: Licensed Clinical Social Worker

## 2015-06-09 DIAGNOSIS — F331 Major depressive disorder, recurrent, moderate: Secondary | ICD-10-CM

## 2015-06-10 NOTE — Progress Notes (Signed)
   THERAPY PROGRESS NOTE  Session Time: 40min  Participation Level: Active  Behavioral Response: CasualAlertEuthymic  Type of Therapy: Individual Therapy  Treatment Goals addressed: Coping  Interventions: Supportive  Summary: Sherry [name] is a 65 y.o. female who presents with a euthymic mood and appropriate affect. She reported that she has been continuing to feel stressed and upset. She shared that her husband recently started a new job, which makes her feel slightly more hopeful about their relationship. She expressed her disgust and frustration that he contributes so little to their relationship. Sherry [name] shared that she recently reconciled with her oldest daughter after her daughter came to her with a serious illness and asked for help. She expressed how good it felt to hug and kiss her daughter and re-commit to improving their relationship. She shared about a recent family trip to see a family member in prison. She reported that she was able to spend some time at a park with her young grandson, and enjoyed swinging and being active. Sherry [name] reported that she is taking her medications regularly and working at better self-care.   Suicidal/Homicidal: Nowithout intent/plan  Therapist Response: LCSW utilized supportive counseling techniques throughout the session in order to validate emotions and encourage open expression of emotion. LCSW and [place] processed about her family relationships and recent stressors.  Plan: Return again in 1 weeks.  Diagnosis: Axis I: See current hospital problem list    Axis II: No diagnosis    Sherry Clines, LCSW 06/10/2015

## 2015-06-16 ENCOUNTER — Ambulatory Visit (INDEPENDENT_AMBULATORY_CARE_PROVIDER_SITE_OTHER): Payer: Self-pay | Admitting: Licensed Clinical Social Worker

## 2015-06-16 DIAGNOSIS — F331 Major depressive disorder, recurrent, moderate: Secondary | ICD-10-CM

## 2015-06-17 NOTE — Progress Notes (Signed)
   THERAPY PROGRESS NOTE  Session Time: 4min  Participation Level: Active  Behavioral Response: Neat and Well GroomedAlertEuthymic  Type of Therapy: Individual Therapy  Treatment Goals addressed: Coping  Interventions: Supportive  Summary: Sherry [name] is a 65 y.o. female who presents with a euthymic mood and appropriate affect. She shared that she has been feeling frustrated since her husband got laid off of work after only one week. She expressed anger towards him for not being financially supportive; she stated that there is no romance or love left in their relationship and that she is no longer considering couples counseling because she does not have hope for their relationship. She reported that the only reason they are together is that she feels he could become unpredictable or aggressive if she tries to kick him out of the house. She reported that he is seeing another woman and possibly using drugs. She decided that she will visit her lawyer today to get legal advice. Sherry [name] shared about her sadness that her grandson is still in jail and is suffering. She shared a letter with LCSW that her grandson wrote. Sherry [name] reported that her repaired relationship with her daughter continues to go well.    Suicidal/Homicidal: Nowithout intent/plan  Therapist Response: LCSW utilized supportive counseling techniques throughout the session in order to validate emotions and encourage open expression of emotion. LCSW and Sherry [name] processed about her current marital conflict and what she wants out of the relationship. LCSW encouraged Sherry [name] to make the decision that will make her the happiest and healthiest. LCSW asked Sherry [name] to identify safety concerns she has regarding her husband's behavior.  Plan: Return again in 1 weeks.  Diagnosis: Axis I: See current hospital problem list    Axis II: No diagnosis    [name], LCSW 06/17/2015

## 2015-06-18 ENCOUNTER — Other Ambulatory Visit: Payer: Self-pay

## 2015-06-18 ENCOUNTER — Telehealth: Payer: Self-pay | Admitting: *Deleted

## 2015-06-18 DIAGNOSIS — Z1231 Encounter for screening mammogram for malignant neoplasm of breast: Secondary | ICD-10-CM

## 2015-06-18 NOTE — Telephone Encounter (Signed)
Patient records faxed to disability determination on 06/18/15.

## 2015-06-19 ENCOUNTER — Ambulatory Visit
Admission: RE | Admit: 2015-06-19 | Discharge: 2015-06-19 | Disposition: A | Discharge status: Home / Self Care | Payer: No Typology Code available for payment source | Source: Ambulatory Visit

## 2015-06-19 DIAGNOSIS — Z1231 Encounter for screening mammogram for malignant neoplasm of breast: Secondary | ICD-10-CM

## 2015-06-20 ENCOUNTER — Other Ambulatory Visit: Payer: Self-pay | Admitting: *Deleted

## 2015-06-20 DIAGNOSIS — R928 Other abnormal and inconclusive findings on diagnostic imaging of breast: Secondary | ICD-10-CM

## 2015-06-23 ENCOUNTER — Ambulatory Visit (INDEPENDENT_AMBULATORY_CARE_PROVIDER_SITE_OTHER): Payer: Self-pay | Admitting: Licensed Clinical Social Worker

## 2015-06-23 DIAGNOSIS — F331 Major depressive disorder, recurrent, moderate: Secondary | ICD-10-CM
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
## 2015-06-29 ENCOUNTER — Emergency Department (HOSPITAL_COMMUNITY)
Admission: EM | Admit: 2015-06-29 | Discharge: 2015-06-30 | Disposition: A | Discharge status: Home / Self Care | Payer: Medicare Other | Attending: Emergency Medicine | Admitting: Emergency Medicine

## 2015-06-29 ENCOUNTER — Encounter (HOSPITAL_COMMUNITY): Payer: Self-pay

## 2015-06-29 ENCOUNTER — Emergency Department (HOSPITAL_COMMUNITY): Payer: Medicare Other

## 2015-06-29 DIAGNOSIS — R51 Headache: Secondary | ICD-10-CM | POA: Insufficient documentation

## 2015-06-29 DIAGNOSIS — Z96651 Presence of right artificial knee joint: Secondary | ICD-10-CM | POA: Diagnosis not present

## 2015-06-29 DIAGNOSIS — Z7982 Long term (current) use of aspirin: Secondary | ICD-10-CM | POA: Insufficient documentation

## 2015-06-29 DIAGNOSIS — Z79899 Other long term (current) drug therapy: Secondary | ICD-10-CM | POA: Insufficient documentation

## 2015-06-29 DIAGNOSIS — I10 Essential (primary) hypertension: Secondary | ICD-10-CM | POA: Diagnosis not present

## 2015-06-29 DIAGNOSIS — M1990 Unspecified osteoarthritis, unspecified site: Secondary | ICD-10-CM | POA: Diagnosis not present

## 2015-06-29 DIAGNOSIS — E785 Hyperlipidemia, unspecified: Secondary | ICD-10-CM | POA: Diagnosis not present

## 2015-06-29 DIAGNOSIS — E119 Type 2 diabetes mellitus without complications: Secondary | ICD-10-CM | POA: Insufficient documentation

## 2015-06-29 DIAGNOSIS — Z8673 Personal history of transient ischemic attack (TIA), and cerebral infarction without residual deficits: Secondary | ICD-10-CM | POA: Diagnosis not present

## 2015-06-29 DIAGNOSIS — Z7984 Long term (current) use of oral hypoglycemic drugs: Secondary | ICD-10-CM | POA: Insufficient documentation

## 2015-06-29 DIAGNOSIS — F4321 Adjustment disorder with depressed mood: Secondary | ICD-10-CM

## 2015-06-29 DIAGNOSIS — F4320 Adjustment disorder, unspecified: Secondary | ICD-10-CM | POA: Diagnosis not present

## 2015-06-29 DIAGNOSIS — R519 Headache, unspecified: Secondary | ICD-10-CM

## 2015-06-29 LAB — CBG MONITORING, ED: Glucose-Capillary: 166 mg/dL — ABNORMAL HIGH (ref 65–99)

## 2015-06-29 MED ORDER — ONDANSETRON HCL 4 MG PO TABS: 4.0000 mg | ORAL_TABLET | Freq: Four times a day (QID) | ORAL | Status: DC

## 2015-06-29 MED ORDER — MORPHINE SULFATE (PF) 4 MG/ML IV SOLN
4.0000 mg | Freq: Once | INTRAVENOUS | Status: AC
Start: 2015-06-29 — End: 2015-06-29
  Administered 2015-06-29: 4 mg via INTRAVENOUS
  Filled 2015-06-29: qty 1

## 2015-06-29 MED ORDER — LORAZEPAM 2 MG/ML IJ SOLN
1.0000 mg | Freq: Once | INTRAMUSCULAR | Status: AC
  Administered 2015-06-29: 1 mg via INTRAVENOUS
  Filled 2015-06-29: qty 1

## 2015-06-29 MED ORDER — HYDROCODONE-ACETAMINOPHEN 5-325 MG PO TABS: 1.0000 | ORAL_TABLET | ORAL | Status: DC | PRN

## 2015-06-29 MED ORDER — LORAZEPAM 1 MG PO TABS: 1.0000 mg | ORAL_TABLET | Freq: Three times a day (TID) | ORAL | Status: DC | PRN

## 2015-06-29 MED ORDER — ONDANSETRON HCL 4 MG/2ML IJ SOLN
4.0000 mg | Freq: Once | INTRAMUSCULAR | Status: AC
  Administered 2015-06-29: 4 mg via INTRAVENOUS
  Filled 2015-06-29: qty 2

## 2015-06-29 NOTE — ED Provider Notes (Signed)
CSN: [id number]     Arrival date & time 06/29/15  2029 History   First MD Initiated Contact with Patient 06/29/15 2039     Chief Complaint  Patient presents with  . Headache   PT'S HUSBAND PASSED AWAY THIS EVENING UNEXPECTEDLY.  SHE WAS EXTREMELY UPSET (UNDERSTANDABLY).  SHE NOW HAS A SEVERE HEADACHE.  (Consider location/radiation/quality/duration/timing/severity/associated sxs/prior Treatment) Patient is a 65 y.o. female presenting with headaches. The history is provided by the patient.  Headache Pain location:  Generalized Quality:  Sharp Radiates to:  Does not radiate Onset quality:  Sudden Timing:  Constant Progression:  Unchanged Chronicity:  New Context: emotional stress   Relieved by:  Nothing   Past Medical History  Diagnosis Date  . Diabetes mellitus   . Hypertension   . GERD (gastroesophageal reflux disease)   . Dyslipidemia   . Mitral valve prolapse occasional palpitations w/ chest discomfort  . DJD (degenerative joint disease)   . Meniere's disease   . Rotator cuff tear, left   . History of CHF (congestive heart failure) 2010-  RESOLVED  . H/O hiatal hernia   . Complication of anesthesia HYPOTENSION INTRAOP W/ HYSTERECTOMY IN 1987--  DID OK W/ TOTAL KNEE IN 2008  . OA (osteoarthritis)   . History of peptic ulcer YRS AGO  . Stroke ([place])   . Cerebral aneurysm    Past Surgical History  Procedure Laterality Date  . Total knee arthroplasty  09-12-2006  [facility]    RIGHT KNEE  . Vaginal hysterectomy  1987  . Bunionectomy  2007    LEFT FOOT  . Tympanoplasty  1990;   1980  . Left shoulder surgery  1997    ROTATOR CUFF REPAIR  . Tubal ligation  1976  . Shoulder arthroscopy  09/20/2011    Procedure: ARTHROSCOPY SHOULDER;  Surgeon: Johnn Hai, MD;  Location: [facility];  Service: Orthopedics;  Laterality: Left;  SAD AND MINI OPEN ROTATOR CUFF REPAIR     Family History  Problem Relation Age of Onset  . Heart disease Mother     in her 30s  .  Diabetes Mother   . Kidney disease Mother   . Thyroid disease Mother   . Aneurysm Mother   . Ovarian cancer Mother   . Thyroid disease Brother   . Thyroid disease Sister   . Heart disease Sister   . Thyroid disease Paternal Grandmother   . Heart attack Sister     at age 39  . Lymphoma Father    Social History  Substance Use Topics  . Smoking status: Never Smoker   . Smokeless tobacco: Never Used  . Alcohol Use: No   OB History    No data available     Review of Systems  Neurological: Positive for headaches.  All other systems reviewed and are negative.     Allergies  Influenza vaccines; Codeine; Demerol; Nsaids; Prednisone; and Tramadol  Home Medications   Prior to Admission medications   Medication Sig Start Date End Date Taking? Authorizing Provider  albuterol (PROVENTIL HFA;VENTOLIN HFA) 108 (90 BASE) MCG/ACT inhaler Inhale 2 puffs into the lungs every 6 (six) hours as needed. For shortness of breath.   Yes Historical Provider, MD  albuterol (PROVENTIL) (2.5 MG/3ML) 0.083% nebulizer solution Take 3 mLs (2.5 mg total) by nebulization every 6 (six) hours as needed for wheezing or shortness of breath. 06/24/14  Yes Darlyne Russian, MD  aspirin EC 325 MG tablet Take 325 mg by mouth  daily.   Yes Historical Provider, MD  cetirizine (ZYRTEC) 10 MG tablet Take 10 mg by mouth daily.   Yes Historical Provider, MD  docusate sodium (COLACE) 100 MG capsule Take 100 mg by mouth daily.   Yes Historical Provider, MD  empagliflozin (JARDIANCE) 10 MG TABS tablet Take 5 mg by mouth daily. 01/13/15  Yes Renato Shin, MD  furosemide (LASIX) 20 MG tablet TAKE 1 TABLET(20 MG) BY MOUTH DAILY 12/25/14  Yes Renato Shin, MD  gabapentin (NEURONTIN) 300 MG capsule Take 2 capsules (600 mg total) by mouth 2 (two) times daily. 04/16/15  Yes Jessica U Vann, DO  ipratropium (ATROVENT) 0.03 % nasal spray Place 2 sprays into the nose 4 (four) times daily. Patient taking differently: Place 2 sprays into the  nose every morning.  11/20/14  Yes Gay Filler Copland, MD  linagliptin (TRADJENTA) 5 MG TABS tablet Take 0.5 tablets (2.5 mg total) by mouth daily. 01/13/15  Yes Renato Shin, MD  lisinopril (PRINIVIL,ZESTRIL) 20 MG tablet TAKE 1 TABLET BY MOUTH DAILY 12/25/14  Yes Renato Shin, MD  metFORMIN (GLUCOPHAGE) 500 MG tablet TAKE 2 TABLETS(1000 MG) BY MOUTH TWICE DAILY WITH A MEAL Patient taking differently: Take 1,000 mg by mouth 2 (two) times daily. TAKE 2 TABLETS(1000 MG) BY MOUTH TWICE DAILY WITH A MEAL 04/16/15  Yes Geradine Girt, DO  metoprolol tartrate (LOPRESSOR) 25 MG tablet Take 1 tablet (25 mg total) by mouth 2 (two) times daily. 07/10/14  Yes Gay Filler Copland, MD  omeprazole (PRILOSEC) 20 MG capsule Take 20 mg by mouth daily.    Yes Historical Provider, MD  QUEtiapine (SEROQUEL) 100 MG tablet Take 25 mg by mouth at bedtime.   Yes Historical Provider, MD  repaglinide (PRANDIN) 2 MG tablet Take 1 tablet (2 mg total) by mouth 2 (two) times daily before a meal. 12/02/14  Yes Renato Shin, MD  sertraline (ZOLOFT) 50 MG tablet Take 1 tablet (50 mg total) by mouth at bedtime. Patient taking differently: Take 25 mg by mouth at bedtime.  11/15/14  Yes Aquilla Hacker, MD  aspirin 81 MG EC tablet Take 4 tablets (325 mg total) by mouth daily. Patient not taking: Reported on 06/29/2015 01/22/15   Rosalin Hawking, MD  atorvastatin (LIPITOR) 10 MG tablet Take 1 tablet (10 mg total) by mouth daily. Patient not taking: Reported on 06/29/2015 05/26/15   Rosalin Hawking, MD  HYDROcodone-acetaminophen (NORCO/VICODIN) 5-325 MG tablet Take 1 tablet by mouth every 4 (four) hours as needed. 06/29/15   Isla Pence, MD  LORazepam (ATIVAN) 1 MG tablet Take 1 tablet (1 mg total) by mouth 3 (three) times daily as needed for anxiety. 06/29/15   Isla Pence, MD  mometasone [facility]) 220 MCG/INH inhaler Use 1 puff daily followed by rinsing your mouth out with water. Patient not taking: Reported on 06/29/2015 06/24/14   Darlyne Russian, MD   ondansetron (ZOFRAN) 4 MG tablet Take 1 tablet (4 mg total) by mouth every 6 (six) hours. 06/29/15   Isla Pence, MD   BP 153/79 mmHg  Pulse 81  Resp 22  SpO2 96% Physical Exam  Constitutional: She is oriented to person, place, and time. She appears well-developed and well-nourished.  HENT:  Head: Normocephalic and atraumatic.  Right Ear: External ear normal.  Left Ear: External ear normal.  Nose: Nose normal.  Mouth/Throat: Oropharynx is clear and moist.  Eyes: Conjunctivae and EOM are normal. Pupils are equal, round, and reactive to light.  Neck: Normal range of motion. Neck  supple.  Cardiovascular: Normal rate, regular rhythm, normal heart sounds and intact distal pulses.   Pulmonary/Chest: Effort normal and breath sounds normal.  Abdominal: Soft. Bowel sounds are normal.  Musculoskeletal: Normal range of motion.  Neurological: She is alert and oriented to person, place, and time.  Skin: Skin is warm and dry.  Psychiatric: Her speech is normal and behavior is normal. Judgment and thought content normal. Her mood appears anxious. Cognition and memory are normal. She exhibits a depressed mood.  Nursing note and vitals reviewed.   ED Course  Procedures (including critical care time) Labs Review Labs Reviewed  CBG MONITORING, ED - Abnormal; Notable for the following:    Glucose-Capillary 166 (*)    All other components within normal limits    Imaging Review Ct Head Wo Contrast  06/29/2015  CLINICAL DATA:  Acute onset of headache.  Initial encounter. EXAM: CT HEAD WITHOUT CONTRAST TECHNIQUE: Contiguous axial images were obtained from the base of the skull through the vertex without intravenous contrast. COMPARISON:  CT of the head performed 04/14/2015, and MRI of the brain performed 04/15/2015 FINDINGS: There is no evidence of acute infarction, mass lesion, or intra- or extra-axial hemorrhage on CT. The posterior fossa, including the cerebellum, brainstem and fourth ventricle,  is within normal limits. The third and lateral ventricles, and basal ganglia are unremarkable in appearance. The cerebral hemispheres are symmetric in appearance, with normal gray-white differentiation. No mass effect or midline shift is seen. There is no evidence of fracture; visualized osseous structures are unremarkable in appearance. The orbits are within normal limits. A mucus retention cyst or polyp is noted at the right maxillary sinus. The remaining paranasal sinuses and mastoid air cells are well-aerated. No significant soft tissue abnormalities are seen. IMPRESSION: 1. No acute intracranial pathology seen on CT. 2. Mucus retention cyst or polyp at the right maxillary sinus. Electronically Signed   By: Garald Balding M.D.   On: 06/29/2015 23:10   I have personally reviewed and evaluated these images and lab results as part of my medical decision-making.   EKG Interpretation None      MDM  PAIN IS MUCH IMPROVED.  PT IS FEELING BETTER.  SHE HAS A LARGE FAMILY WITH A LOT OF SUPPORT.  SHE IS INSTR TO RETURN IF WORSE. Final diagnoses:  Acute nonintractable headache, unspecified headache type  Grief reaction        Isla Pence, MD 06/30/15 [phone]

## 2015-06-29 NOTE — Progress Notes (Signed)
Pt was admitted to ED as she complained of headache. Her husband passed in ED and she became ill and was admitted. Pt told [place] she had a history of mini strokes and aneurysms. [place] shared info w/staff who reacted promptly to her care. [place] provided extended grief support as well as pastoral care during pt's admittance.  Please page if additional support is needed. Chaplain Ernest Haber, M.Div.   06/29/15 2200  Clinical Encounter Type  Visited With Patient and family together

## 2015-06-29 NOTE — ED Notes (Signed)
Pt experienced the loss of her husband here in this ED. Pt is experiencing acute HA behind her right eye. Pt has hx of TIAs.

## 2015-06-30 ENCOUNTER — Other Ambulatory Visit: Payer: Self-pay | Admitting: Licensed Clinical Social Worker

## 2015-07-01 ENCOUNTER — Other Ambulatory Visit (HOSPITAL_COMMUNITY): Payer: Self-pay | Admitting: *Deleted

## 2015-07-01 DIAGNOSIS — R928 Other abnormal and inconclusive findings on diagnostic imaging of breast: Secondary | ICD-10-CM

## 2015-07-08 ENCOUNTER — Other Ambulatory Visit: Payer: Self-pay | Admitting: Licensed Clinical Social Worker

## 2015-07-09 DIAGNOSIS — Z0271 Encounter for disability determination: Secondary | ICD-10-CM

## 2015-07-14 ENCOUNTER — Other Ambulatory Visit: Payer: Self-pay | Admitting: Licensed Clinical Social Worker

## 2015-07-17 ENCOUNTER — Ambulatory Visit
Admission: RE | Admit: 2015-07-17 | Discharge: 2015-07-17 | Disposition: A | Discharge status: Home / Self Care | Payer: No Typology Code available for payment source | Source: Ambulatory Visit | Attending: Obstetrics and Gynecology | Admitting: Obstetrics and Gynecology

## 2015-07-17 ENCOUNTER — Encounter (HOSPITAL_COMMUNITY): Payer: Self-pay

## 2015-07-17 ENCOUNTER — Other Ambulatory Visit (HOSPITAL_COMMUNITY): Payer: Self-pay | Admitting: *Deleted

## 2015-07-17 ENCOUNTER — Ambulatory Visit (HOSPITAL_COMMUNITY)
Admission: RE | Admit: 2015-07-17 | Discharge: 2015-07-17 | Disposition: A | Discharge status: Home / Self Care | Payer: Self-pay | Source: Ambulatory Visit | Attending: Obstetrics and Gynecology | Admitting: Obstetrics and Gynecology

## 2015-07-17 VITALS — BP 124/82 | Temp 97.8°F | Ht 62.0 in | Wt 193.2 lb

## 2015-07-17 DIAGNOSIS — R2232 Localized swelling, mass and lump, left upper limb: Secondary | ICD-10-CM

## 2015-07-17 DIAGNOSIS — R2231 Localized swelling, mass and lump, right upper limb: Secondary | ICD-10-CM

## 2015-07-17 DIAGNOSIS — Z1239 Encounter for other screening for malignant neoplasm of breast: Secondary | ICD-10-CM

## 2015-07-17 DIAGNOSIS — R928 Other abnormal and inconclusive findings on diagnostic imaging of breast: Secondary | ICD-10-CM

## 2015-07-17 NOTE — Patient Instructions (Signed)
Educational materials on self breast awareness given. Explained to Sherry [name] that she did not need a Pap smear today due to her history of a hysterectomy for benign reasons. Informed patient that she does not need any further Pap smears due to her history of a hysterectomy for benign reasons and no history of an abnormal Pap smear. Referred patient to the [facility] for diagnostic mammogram. Appointment scheduled for Thursday, July 17, 2015 at 0830.  Sherry [name] verbalized understanding.  Janyce Ellinger, Arvil [place], RN 8:47 AM

## 2015-07-17 NOTE — Progress Notes (Signed)
Patient referred to [facility] due to needing additional imaging of her right breast. Screening mammogram completed 06/19/2015 at the [facility]. Patient complained today of a left axillary lump x 2 weeks that has increased in size and is painful. Patient states the pain comes and goes. Patient told me that the pain is worse with movement. Patient rated pain at a 4 out of 10.  Pap Smear:  Pap smear not completed today. Last Pap smear was 20 years ago and normal per patient. Per patient has no history of an abnormal Pap smear. Patient has a history of a hysterectomy 30 years ago for fibroids. Patient no longer needs Pap smears due to her history of a hysterectomy for benign reasons per ACOG and BCCCP guidelines. No Pap smear results are in EPIC.  Physical exam: Breasts Breasts symmetrical. No skin abnormalities bilateral breasts. Observed multiple reddish colored bumps in bilateral axilla. No nipple retraction bilateral breasts. No nipple discharge bilateral breasts. No lymphadenopathy. No lumps palpated bilateral breasts. Palpated a lump within the left axilla between 10 and 11 o'clock that is 15 cm from the nipple. Palpated a pea sized lump within the right axilla at 11 o'clock 13 cm from the nipple. Patient stated she recently changed raizors. Complaints of tenderness when palpated left axillary lump. Referred patient to the [facility] for diagnostic mammogram. Appointment scheduled for Thursday, July 17, 2015 at 0830.      Pelvic/Bimanual No Pap smear completed today since patient has a history of a hysterectomy for benign reasons. Pap smear not indicated per BCCCP guidelines.   Smoking History: Patient has never smoked.  Patient Navigation: Patient education provided. Access to services provided for patient through [facility] program.   Colorectal Cancer Screening: Patient had a colonoscopy completed 14 years ago. No complaints today. Discussed with patient that she is  past due for her next colonoscopy.

## 2015-07-20 ENCOUNTER — Other Ambulatory Visit: Payer: Self-pay | Admitting: Obstetrics and Gynecology

## 2015-07-20 MED ORDER — CEPHALEXIN 500 MG PO CAPS: 500.0000 mg | ORAL_CAPSULE | Freq: Four times a day (QID) | ORAL | Status: DC

## 2015-07-21 ENCOUNTER — Telehealth (HOSPITAL_COMMUNITY): Payer: Self-pay | Admitting: *Deleted

## 2015-07-21 ENCOUNTER — Other Ambulatory Visit: Payer: Self-pay | Admitting: Licensed Clinical Social Worker

## 2015-07-21 NOTE — Telephone Encounter (Signed)
Patient returned my phone call. Explained to patient the prescription Keflex that has been called into her pharmacy to treat the folliculitis. Patient verbalized understanding.

## 2015-07-21 NOTE — Telephone Encounter (Signed)
Attempted to call patient to let her know that a prescription for Keflex has been called into her pharmacy for her folliculitis/cellulitis of the axilla. No one answered the phone the phone. Left voicemail for patient to call me back.

## 2015-07-22 ENCOUNTER — Encounter (HOSPITAL_COMMUNITY): Payer: Self-pay | Admitting: *Deleted

## 2015-08-11 ENCOUNTER — Other Ambulatory Visit: Payer: Self-pay | Admitting: Licensed Clinical Social Worker

## 2015-08-18 ENCOUNTER — Ambulatory Visit (INDEPENDENT_AMBULATORY_CARE_PROVIDER_SITE_OTHER): Payer: Self-pay | Admitting: Licensed Clinical Social Worker

## 2015-08-18 DIAGNOSIS — F331 Major depressive disorder, recurrent, moderate: Secondary | ICD-10-CM
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
## 2015-09-01 ENCOUNTER — Other Ambulatory Visit: Payer: Self-pay | Admitting: Licensed Clinical Social Worker

## 2015-09-08 ENCOUNTER — Ambulatory Visit (INDEPENDENT_AMBULATORY_CARE_PROVIDER_SITE_OTHER): Payer: Self-pay | Admitting: Licensed Clinical Social Worker

## 2015-09-08 DIAGNOSIS — F331 Major depressive disorder, recurrent, moderate: Secondary | ICD-10-CM

## 2015-09-08 NOTE — Progress Notes (Signed)
   THERAPY PROGRESS NOTE  Session Time: 29min  Participation Level: Active  Behavioral Response: CasualAlertDepressed  Type of Therapy: Individual Therapy  Treatment Goals addressed: Coping  Interventions: Supportive  Summary: Sherry [name] is a 65 y.o. female who presents with a depressed mood and appropriate affect. She shared that she has been very stressed lately. She reported that she has significant financial stress in addition to stressors related to family members. She shared that she has moved through the denial stage of grief and feels less angry every day. She expressed continued anger towards the medical staff at the hospital who cared for her husband as he was dying; she was not receptive to [facility] feedback about the possibility that this anger was misdirected. Sherry [name] shared that her teenage grandson continues to be held in jail and that his bail is set very high due to him violating his probation. She reported that this may be the best place for him currently, as he was beginning to engage in gang activity. She shared about her relationship with her aging father and her concerns for her mother's health.    Suicidal/Homicidal: Nowithout intent/plan  Therapist Response: LCSW utilized supportive counseling techniques throughout the session in order to validate emotions and encourage open expression of emotion. LCSW and Sherry [name] processed about her current stressors.  Plan: Return again in 1 weeks.  Diagnosis: Axis I: See current hospital problem list    Axis II: No diagnosis    Sherry Clines, LCSW 09/08/2015

## 2015-09-15 ENCOUNTER — Ambulatory Visit: Payer: Self-pay | Admitting: Licensed Clinical Social Worker

## 2015-09-15 DIAGNOSIS — F331 Major depressive disorder, recurrent, moderate: Secondary | ICD-10-CM
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
## 2015-09-29 ENCOUNTER — Ambulatory Visit (INDEPENDENT_AMBULATORY_CARE_PROVIDER_SITE_OTHER): Payer: Self-pay | Admitting: Licensed Clinical Social Worker

## 2015-09-29 DIAGNOSIS — F331 Major depressive disorder, recurrent, moderate: Secondary | ICD-10-CM

## 2015-10-01 NOTE — Progress Notes (Signed)
   THERAPY PROGRESS NOTE  Session Time: 89min  Participation Level: Active  Behavioral Response: Neat and Well GroomedAlertEuthymic  Type of Therapy: Individual Therapy  Treatment Goals addressed: Coping  Interventions: Supportive  Summary: Sherry [name] is a 65 y.o. female who presents with a euthymic mood and appropriate affect. She reported that she continues to feel stressed by financial difficulties. She shared that she is still grieving for her husband who died but that she is slowly starting to feel better. She shared about bringing her family together for daily morning check-ins, and the satisfaction that has given her. She shared about the support that she is receiving from various family members. She stated that she has been spending time with her toddler grandson, which has been helpful in keeping her cheerful.      Suicidal/Homicidal: Nowithout intent/plan  Therapist Response: LCSW utilized supportive counseling techniques throughout the session in order to validate emotions and encourage open expression of emotion.  Plan: Return again in 2 weeks.  Diagnosis: Axis I: See current hospital problem list    Axis II: No diagnosis    [name], LCSW 10/01/2015

## 2015-10-06 ENCOUNTER — Other Ambulatory Visit: Payer: Self-pay | Admitting: Licensed Clinical Social Worker

## 2015-10-27 ENCOUNTER — Other Ambulatory Visit: Payer: Self-pay | Admitting: Licensed Clinical Social Worker

## 2015-11-25 ENCOUNTER — Ambulatory Visit: Payer: Self-pay | Admitting: Neurology

## 2015-11-27 ENCOUNTER — Encounter: Payer: Self-pay | Admitting: Neurology

## 2015-12-18 ENCOUNTER — Ambulatory Visit (INDEPENDENT_AMBULATORY_CARE_PROVIDER_SITE_OTHER): Payer: Medicare Other | Admitting: Family Medicine

## 2015-12-18 ENCOUNTER — Ambulatory Visit (HOSPITAL_BASED_OUTPATIENT_CLINIC_OR_DEPARTMENT_OTHER)
Admission: RE | Admit: 2015-12-18 | Discharge: 2015-12-18 | Disposition: A | Discharge status: Home / Self Care | Payer: Medicare Other | Source: Ambulatory Visit | Attending: Family Medicine | Admitting: Family Medicine

## 2015-12-18 ENCOUNTER — Encounter: Payer: Self-pay | Admitting: Family Medicine

## 2015-12-18 VITALS — BP 136/63 | HR 57 | Temp 98.2°F | Ht 62.75 in | Wt 189.8 lb

## 2015-12-18 DIAGNOSIS — F331 Major depressive disorder, recurrent, moderate: Secondary | ICD-10-CM

## 2015-12-18 DIAGNOSIS — Z5181 Encounter for therapeutic drug level monitoring: Secondary | ICD-10-CM

## 2015-12-18 DIAGNOSIS — M1611 Unilateral primary osteoarthritis, right hip: Secondary | ICD-10-CM | POA: Diagnosis not present

## 2015-12-18 DIAGNOSIS — M25551 Pain in right hip: Secondary | ICD-10-CM

## 2015-12-18 DIAGNOSIS — M47896 Other spondylosis, lumbar region: Secondary | ICD-10-CM | POA: Insufficient documentation

## 2015-12-18 DIAGNOSIS — E118 Type 2 diabetes mellitus with unspecified complications: Secondary | ICD-10-CM

## 2015-12-18 DIAGNOSIS — Z119 Encounter for screening for infectious and parasitic diseases, unspecified: Secondary | ICD-10-CM | POA: Diagnosis not present

## 2015-12-18 LAB — COMPREHENSIVE METABOLIC PANEL
ALT: 20 U/L (ref 0–35)
AST: 17 U/L (ref 0–37)
Albumin: 4.1 g/dL (ref 3.5–5.2)
Alkaline Phosphatase: 76 U/L (ref 39–117)
BUN: 13 mg/dL (ref 6–23)
CO2: 28 mEq/L (ref 19–32)
Calcium: 9.4 mg/dL (ref 8.4–10.5)
Chloride: 107 mEq/L (ref 96–112)
Creatinine, Ser: 0.68 mg/dL (ref 0.40–1.20)
GFR: 111.7 mL/min (ref >60.00)
Glucose, Bld: 116 mg/dL — ABNORMAL HIGH (ref 70–99)
Potassium: 4.1 mEq/L (ref 3.5–5.1)
Sodium: 142 mEq/L (ref 135–145)
Total Bilirubin: 0.6 mg/dL (ref 0.2–1.2)
Total Protein: 7 g/dL (ref 6.0–8.3)

## 2015-12-18 LAB — CBC
HCT: 43.5 % (ref 36.0–46.0)
Hemoglobin: 14.1 g/dL (ref 12.0–15.0)
MCHC: 32.4 g/dL (ref 30.0–36.0)
MCV: 87.9 fl (ref 78.0–100.0)
Platelets: 282 10*3/uL (ref 150.0–400.0)
RBC: 4.95 Mil/uL (ref 3.87–5.11)
RDW: 14.7 % (ref 11.5–15.5)
WBC: 6.8 10*3/uL (ref 4.0–10.5)

## 2015-12-18 LAB — HEMOGLOBIN A1C: Hgb A1c MFr Bld: 6.8 % — ABNORMAL HIGH (ref 4.6–6.5)

## 2015-12-18 MED ORDER — EMPAGLIFLOZIN 10 MG PO TABS: 5.0000 mg | ORAL_TABLET | Freq: Every day | ORAL | 3 refills | Status: DC

## 2015-12-18 MED ORDER — REPAGLINIDE 2 MG PO TABS: 2.0000 mg | ORAL_TABLET | Freq: Two times a day (BID) | ORAL | 3 refills | Status: DC

## 2015-12-18 MED ORDER — LINAGLIPTIN 5 MG PO TABS: 2.5000 mg | ORAL_TABLET | Freq: Every day | ORAL | 3 refills | Status: DC

## 2015-12-18 MED FILL — REPAGLINIDE 2 MG TABLET: 2 | 90 days supply | Qty: 180 | Fill #0

## 2015-12-18 MED FILL — TRADJENTA 5 MG TABLET: 5 | 30 days supply | Qty: 15 | Fill #0

## 2015-12-18 MED FILL — JARDIANCE 10 MG TABLET: 10 | 30 days supply | Qty: 15 | Fill #0

## 2015-12-18 NOTE — Progress Notes (Signed)
Sherry [name] at [facility] [address], [place], [place] [address] [phone]  Date:  12/18/2015   Name:  Sherry [name]   DOB:  04-12-1950   MRN:  [id number]  PCP:  [name], MD    Chief Complaint: Establish Care (Pt here to est care. Will need refill on linagliptin and repaglinide. )   History of Present Illness:  Sherry [name] is a 65 y.o. very pleasant female patient who presents with the following:  Here today to establish care, although I have taken care of this pt at [facility] Last visit with me about one year ago- partial HPI from that visit: Here today for hospital follow-up. She came to clinic on 10/3 with sx of TIA, was sent to the ER.  She was admitted 10/3 to 10/7 and dx with TIA.  She also had complaint of CP.  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  Cardiology did a myoview which was negative.    She reports that she is feeling tired and has a "naggy little headache, but nothing like I had before.  No tingling, just squeezing in my head."   She is sleeping a lot, feeling tired out She also noted that she is getting "out of breath" from walking a short distance.  She has never been a smoker, we do not think that she has COPD but she has not been tested.   She also noted PND and a feeling of something caught in her throat that is bothersome She is on asmanex and has a neb machine for albuterol for history of asthma.   She is seeing Dr. Estanislado Pandy with interventional radiology about the aneurysm in about 10 days.  She notes that she still has some trouble thinking clearly and does not feel like herself. She still feels anxious and upset following this health incident- see depression screen  She is not on any medication for possible glaucoma- she sees Dr. Idolina Primer.   She also has history of DM, hyperlipidemia, HTN, peripheral neuropathy/  foot drop, CHF.  Last A1c was several months ago and showed poor control-  Lab Results  Component Value Date   HGBA1C 9.7 (H) 04/15/2015   She is seeing a therapist for her depression. Her husband passed away in 07/12/2022 of this year-  She states that he went into a fib at home, she called 911 but traffic was heavy so she drove him to [facility].  He went into cardiac arrest and died.  They had been married for 8 years, were together for about 40 years total however. She does not really feel like therapy is helping her; she relates having a hard time accepting help as she is used to being the one helping.  She does not really feel like she is getting better from her grief at his death yet.   She is using zoloft and seroquel She is alone in her home right now, she is taking care of her 65 yo great grand-son a lot of the time which keeps her busy.  She denies SI, and is still finding joy in life  They are watching her intracranial aneurysm right now- the plan to continue monitoring this annually and only operate if it enlarges.    DM: she has been seeing Dr. Loanne Drilling, he treated her for a goiter.  She is overdue for a  visit with him so we will check her A1c today and have her follow-up as needed  She has noted right hip pain for several months- she does not use NSAIDS as they give her palpitations- she will use vicodin as needed for more severe pain.  She has had a couple of falls recently.  She has a neurologist and plans to see Dr. Erlinda Hong soon for follow-up and will be sure to tell him about her falls.  In the meantime she is using a cane as needed   Patient Active Problem List   Diagnosis Date Noted  . Vertigo 04/14/2015  . Anxiety state 01/22/2015  . HLD (hyperlipidemia) 01/22/2015  . Type 2 diabetes mellitus with circulatory disorder ([place]) 01/22/2015  . Intracranial vascular stenosis 01/22/2015  . Aneurysm ([place])   . Headache   . Transient cerebral ischemia   . Diabetes mellitus with complication ([place])  0000000  . Essential hypertension 11/12/2014  . Dependent edema 11/12/2014  . Peripheral neuropathy ([place]) 11/12/2014  . Depression 11/12/2014  . Facial droop   . Chest pain   . TIA (transient ischemic attack) 11/11/2014  . Dyspnea 08/06/2014  . Hyperthyroidism 08/06/2014  . Meniere's disease 06/07/2014  . History of CHF (congestive heart failure) 06/07/2014  . Essential hypertension, benign 06/13/2013  . Type II or unspecified type diabetes mellitus without mention of complication, not stated as uncontrolled 06/13/2013  . Goiter 06/13/2013  . Obesity, unspecified 06/13/2013    Past Medical History:  Diagnosis Date  . Cerebral aneurysm   . Complication of anesthesia HYPOTENSION INTRAOP W/ HYSTERECTOMY IN 1987--  DID OK W/ TOTAL KNEE IN 2008  . Diabetes mellitus   . DJD (degenerative joint disease)   . Dyslipidemia   . GERD (gastroesophageal reflux disease)   . H/O hiatal hernia   . History of CHF (congestive heart failure) 2010-  RESOLVED  . History of peptic ulcer YRS AGO  . Hypertension   . Meniere's disease   . Mitral valve prolapse occasional palpitations w/ chest discomfort  . OA (osteoarthritis)   . Rotator cuff tear, left   . Stroke [facility])     Past Surgical History:  Procedure Laterality Date  . BUNIONECTOMY  2007   LEFT FOOT  . LEFT SHOULDER SURGERY  1997   ROTATOR CUFF REPAIR  . SHOULDER ARTHROSCOPY  09/20/2011   Procedure: ARTHROSCOPY SHOULDER;  Surgeon: Johnn Hai, MD;  Location: [facility];  Service: Orthopedics;  Laterality: Left;  SAD AND MINI OPEN ROTATOR CUFF REPAIR    . TOTAL KNEE ARTHROPLASTY  09-12-2006  [facility]   RIGHT KNEE  . TUBAL LIGATION  1976  . TYMPANOPLASTY  1990;   1980  . VAGINAL HYSTERECTOMY  1987    Social History  Substance Use Topics  . Smoking status: Never Smoker  . Smokeless tobacco: Never Used  . Alcohol use No    Family History  Problem Relation Age of Onset  . Heart disease Mother     in her 2s   . Diabetes Mother   . Kidney disease Mother   . Thyroid disease Mother   . Aneurysm Mother   . Ovarian cancer Mother   . Breast cancer Mother   . Thyroid disease Brother   . Thyroid disease Sister   . Heart disease Sister   . Thyroid disease Paternal Grandmother   . Heart attack Sister     at age 65  . Lymphoma Father   . Breast cancer Maternal Aunt   .  Breast cancer Maternal Grandmother   . Breast cancer Maternal Aunt   . Breast cancer Maternal Aunt     Allergies  Allergen Reactions  . Influenza Vaccines Anaphylaxis  . Codeine Itching  . Demerol Other (See Comments)    SEIZURE  . Nsaids Other (See Comments)    NAPROXEN, IBUPROFEN, SKELAXIN---  CAUSES PALPITATIONS  . Prednisone     Caused her glucose to go up to 500 and went to ED  . Tramadol     Contraindicated with Victoza     Medication list has been reviewed and updated.  Current Outpatient Prescriptions on File Prior to Visit  Medication Sig Dispense Refill  . albuterol (PROVENTIL HFA;VENTOLIN HFA) 108 (90 BASE) MCG/ACT inhaler Inhale 2 puffs into the lungs every 6 (six) hours as needed. For shortness of breath.    Marland Kitchen albuterol (PROVENTIL) (2.5 MG/3ML) 0.083% nebulizer solution Take 3 mLs (2.5 mg total) by nebulization every 6 (six) hours as needed for wheezing or shortness of breath. 150 mL 1  . aspirin EC 325 MG tablet Take 325 mg by mouth daily.    . cetirizine (ZYRTEC) 10 MG tablet Take 10 mg by mouth daily.    Marland Kitchen docusate sodium (COLACE) 100 MG capsule Take 100 mg by mouth daily.    . empagliflozin (JARDIANCE) 10 MG TABS tablet Take 5 mg by mouth daily. 15 tablet 11  . furosemide (LASIX) 20 MG tablet TAKE 1 TABLET(20 MG) BY MOUTH DAILY 30 tablet 0  . gabapentin (NEURONTIN) 300 MG capsule Take 2 capsules (600 mg total) by mouth 2 (two) times daily. 60 capsule 5  . HYDROcodone-acetaminophen (NORCO/VICODIN) 5-325 MG tablet Take 1 tablet by mouth every 4 (four) hours as needed. 10 tablet 0  . ipratropium (ATROVENT)  0.03 % nasal spray Place 2 sprays into the nose 4 (four) times daily. (Patient taking differently: Place 2 sprays into the nose every morning. ) 30 mL 6  . linagliptin (TRADJENTA) 5 MG TABS tablet Take 0.5 tablets (2.5 mg total) by mouth daily. 15 tablet 11  . lisinopril (PRINIVIL,ZESTRIL) 20 MG tablet TAKE 1 TABLET BY MOUTH DAILY 30 tablet 0  . LORazepam (ATIVAN) 1 MG tablet Take 1 tablet (1 mg total) by mouth 3 (three) times daily as needed for anxiety. 15 tablet 0  . metFORMIN (GLUCOPHAGE) 500 MG tablet TAKE 2 TABLETS(1000 MG) BY MOUTH TWICE DAILY WITH A MEAL (Patient taking differently: Take 1,000 mg by mouth 2 (two) times daily. TAKE 2 TABLETS(1000 MG) BY MOUTH TWICE DAILY WITH A MEAL) 120 tablet 0  . metoprolol tartrate (LOPRESSOR) 25 MG tablet Take 1 tablet (25 mg total) by mouth 2 (two) times daily. 60 tablet 5  . mometasone (ASMANEX) 220 MCG/INH inhaler Use 1 puff daily followed by rinsing your mouth out with water. 1 Inhaler 12  . QUEtiapine (SEROQUEL) 100 MG tablet Take 50 mg by mouth at bedtime.     . repaglinide (PRANDIN) 2 MG tablet Take 1 tablet (2 mg total) by mouth 2 (two) times daily before a meal. 90 tablet 11  . sertraline (ZOLOFT) 50 MG tablet Take 1 tablet (50 mg total) by mouth at bedtime. (Patient taking differently: Take 25 mg by mouth at bedtime. ) 30 tablet 0   No current facility-administered medications on file prior to visit.     Review of Systems:  As per HPI- otherwise negative.   Physical Examination: Blood pressure 136/63, pulse (!) 57, temperature 98.2 F (36.8 C), temperature source Oral, height 5'  2.75" (1.594 m), weight 189 lb 12.8 oz (86.1 kg), SpO2 100 %.  GEN: WDWN, NAD, Non-toxic, A & O x 3, obese, looks well  Bilateral TM wnl, oropharynx normal.  PEERL,EOMI.   HEENT: Atraumatic, Normocephalic. Neck supple. No masses, No LAD. Ears and Nose: No external deformity. CV: RRR, No M/G/R. No JVD. No thrill. No extra heart sounds. PULM: CTA B, no wheezes,  crackles, rhonchi. No retractions. No resp. distress. No accessory muscle use. ABD: S, NT, ND, +BS. No rebound. No HSM.  Benign belly Right hip: she has normal ROM but does note some pain with external rotation of flexed hip.   EXTR: No c/c/e NEURO Normal gait.  PSYCH: Normally interactive. Conversant. Not depressed or anxious appearing.  Calm demeanor.    Assessment and Plan: Type 2 diabetes mellitus with complication, without long-term current use of insulin (HCC) - Plan: Hemoglobin A1C, linagliptin (TRADJENTA) 5 MG TABS tablet, repaglinide (PRANDIN) 2 MG tablet, empagliflozin (JARDIANCE) 10 MG TABS tablet  Major depressive disorder, recurrent episode, moderate with anxious distress (HCC)  Right hip pain - Plan: DG HIP UNILAT W OR W/O PELVIS 2-3 VIEWS RIGHT  Screening examination for infectious disease - Plan: Hepatitis C antibody  Medication monitoring encounter - Plan: CBC, Comprehensive metabolic panel  If she does need medication for her DM will need something that is NOT metformin she could not tolerate this at all  Will plan further follow- up pending labs. X-ray of her hip today as well- will be in touch with her pending her results   Signed [name], MD

## 2015-12-18 NOTE — Progress Notes (Signed)
Pre visit review using our clinic review tool, if applicable. No additional management support is needed unless otherwise documented below in the visit note. 

## 2015-12-18 NOTE — Patient Instructions (Signed)
It was nice to see you today- I am so sorry for the loss of your husband!   Please go to lab, and then to the imaging department on the ground floor for x-rays of your right hip I refilled your medications today and will be in touch with your labs asap

## 2015-12-19 LAB — HEPATITIS C ANTIBODY: HCV Ab: NEGATIVE

## 2015-12-22 ENCOUNTER — Encounter: Payer: Self-pay | Admitting: Family Medicine

## 2015-12-22 ENCOUNTER — Telehealth: Payer: Self-pay | Admitting: Family Medicine

## 2015-12-22 DIAGNOSIS — Z1211 Encounter for screening for malignant neoplasm of colon: Secondary | ICD-10-CM

## 2015-12-22 NOTE — Telephone Encounter (Signed)
Patient is requesting a referral for a colonoscopy. Please advise.   Patient phone: [phone]

## 2015-12-23 ENCOUNTER — Encounter: Payer: Self-pay | Admitting: Physician Assistant

## 2015-12-25 ENCOUNTER — Encounter: Payer: Self-pay | Admitting: Family Medicine

## 2015-12-25 DIAGNOSIS — M25551 Pain in right hip: Secondary | ICD-10-CM

## 2015-12-28 NOTE — Progress Notes (Signed)
Subjective:    Patient ID: Sherry [name], female    DOB: 12/17/1950, 65 y.o.   MRN: [id number]  HPI Pt returns for f/u of diabetes mellitus: DM type: 2 Dx'ed: 7412 Complications: painful polyneuropathy and TIA. Therapy: 4 oral meds.  GDM: never.  DKA: never. Severe hypoglycemia: never. Pancreatitis: never. Other: she has never been on insulin; she did not tolerate victoza (sob) Interval history: She takes meds as rx'ed.  pt states she feels well in general. She had I-131 for hyperthyroidism in mid-2016 (was due to multinodular goiter, noted on CT; after RAI, she became euthyroid off any rx).  She does not notice the goiter. Past Medical History:  Diagnosis Date  . Cerebral aneurysm   . Complication of anesthesia HYPOTENSION INTRAOP W/ HYSTERECTOMY IN 1987--  DID OK W/ TOTAL KNEE IN 2008  . Diabetes mellitus   . DJD (degenerative joint disease)   . Dyslipidemia   . GERD (gastroesophageal reflux disease)   . H/O hiatal hernia   . History of CHF (congestive heart failure) 2010-  RESOLVED  . History of peptic ulcer YRS AGO  . Hypertension   . Meniere's disease   . Mitral valve prolapse occasional palpitations w/ chest discomfort  . OA (osteoarthritis)   . Rotator cuff tear, left   . Stroke [facility])     Past Surgical History:  Procedure Laterality Date  . BUNIONECTOMY  2007   LEFT FOOT  . LEFT SHOULDER SURGERY  1997   ROTATOR CUFF REPAIR  . SHOULDER ARTHROSCOPY  09/20/2011   Procedure: ARTHROSCOPY SHOULDER;  Surgeon: Johnn Hai, MD;  Location: [facility];  Service: Orthopedics;  Laterality: Left;  SAD AND MINI OPEN ROTATOR CUFF REPAIR    . TOTAL KNEE ARTHROPLASTY  09-12-2006  [facility]   RIGHT KNEE  . TUBAL LIGATION  1976  . TYMPANOPLASTY  1990;   1980  . VAGINAL HYSTERECTOMY  1987    Social History   Social History  . Marital status: Widowed    Spouse name: N/A  . Number of children: N/A  . Years of education: N/A   Occupational History  .  Not on file.   Social History Main Topics  . Smoking status: Never Smoker  . Smokeless tobacco: Never Used  . Alcohol use No  . Drug use: No  . Sexual activity: Not on file   Other Topics Concern  . Not on file   Social History Narrative  . No narrative on file    Current Outpatient Prescriptions on File Prior to Visit  Medication Sig Dispense Refill  . albuterol (PROVENTIL HFA;VENTOLIN HFA) 108 (90 BASE) MCG/ACT inhaler Inhale 2 puffs into the lungs every 6 (six) hours as needed. For shortness of breath.    Marland Kitchen albuterol (PROVENTIL) (2.5 MG/3ML) 0.083% nebulizer solution Take 3 mLs (2.5 mg total) by nebulization every 6 (six) hours as needed for wheezing or shortness of breath. 150 mL 1  . aspirin EC 325 MG tablet Take 325 mg by mouth daily.    . cetirizine (ZYRTEC) 10 MG tablet Take 10 mg by mouth daily.    . clopidogrel (PLAVIX) 75 MG tablet Take 75 mg by mouth daily.    Marland Kitchen docusate sodium (COLACE) 100 MG capsule Take 100 mg by mouth daily.    . empagliflozin (JARDIANCE) 10 MG TABS tablet Take 5 mg by mouth daily. 45 tablet 3  . furosemide (LASIX) 20 MG tablet TAKE 1 TABLET(20 MG) BY MOUTH DAILY 30 tablet 0  .  gabapentin (NEURONTIN) 300 MG capsule Take 2 capsules (600 mg total) by mouth 2 (two) times daily. 60 capsule 5  . HYDROcodone-acetaminophen (NORCO/VICODIN) 5-325 MG tablet Take 1 tablet by mouth every 4 (four) hours as needed. 10 tablet 0  . ipratropium (ATROVENT) 0.03 % nasal spray Place 2 sprays into the nose 4 (four) times daily. (Patient taking differently: Place 2 sprays into the nose every morning. ) 30 mL 6  . lansoprazole (PREVACID) 30 MG capsule Take 1 capsule (30 mg total) by mouth daily. 30-60 mins before eating. 30 capsule 3  . linagliptin (TRADJENTA) 5 MG TABS tablet Take 0.5 tablets (2.5 mg total) by mouth daily. 45 tablet 3  . lisinopril (PRINIVIL,ZESTRIL) 20 MG tablet TAKE 1 TABLET BY MOUTH DAILY 30 tablet 0  . LORazepam (ATIVAN) 1 MG tablet Take 1 tablet (1 mg  total) by mouth 3 (three) times daily as needed for anxiety. 15 tablet 0  . metFORMIN (GLUCOPHAGE) 500 MG tablet TAKE 2 TABLETS(1000 MG) BY MOUTH TWICE DAILY WITH A MEAL (Patient taking differently: Take 1,000 mg by mouth 2 (two) times daily. TAKE 2 TABLETS(1000 MG) BY MOUTH TWICE DAILY WITH A MEAL) 120 tablet 0  . metoprolol tartrate (LOPRESSOR) 25 MG tablet Take 1 tablet (25 mg total) by mouth 2 (two) times daily. 60 tablet 5  . mometasone (ASMANEX) 220 MCG/INH inhaler Use 1 puff daily followed by rinsing your mouth out with water. 1 Inhaler 12  . Na Sulfate-K Sulfate-Mg Sulf 17.5-3.13-1.6 GM/180ML SOLN Take 1 kit by mouth as directed. 354 mL 0  . QUEtiapine (SEROQUEL) 100 MG tablet Take 50 mg by mouth at bedtime.     . repaglinide (PRANDIN) 2 MG tablet Take 1 tablet (2 mg total) by mouth 2 (two) times daily before a meal. 180 tablet 3  . sertraline (ZOLOFT) 50 MG tablet Take 1 tablet (50 mg total) by mouth at bedtime. (Patient taking differently: Take 25 mg by mouth at bedtime. ) 30 tablet 0  . ranitidine (ZANTAC) 75 MG tablet Take 75 mg by mouth at bedtime.     No current facility-administered medications on file prior to visit.     Allergies  Allergen Reactions  . Influenza Vaccines Anaphylaxis  . Codeine Itching  . Demerol Other (See Comments)    SEIZURE  . Nsaids Other (See Comments)    NAPROXEN, IBUPROFEN, SKELAXIN---  CAUSES PALPITATIONS  . Prednisone     Caused her glucose to go up to 500 and went to ED  . Tramadol     Contraindicated with Victoza     Family History  Problem Relation Age of Onset  . Heart disease Mother     in her 80s  . Diabetes Mother   . Kidney disease Mother   . Thyroid disease Mother   . Aneurysm Mother   . Ovarian cancer Mother   . Breast cancer Mother   . Thyroid disease Sister   . Heart disease Sister   . Lymphoma Father   . Thyroid disease Brother   . Thyroid disease Paternal Grandmother   . Heart attack Sister     at age 55  . Breast  cancer Maternal Aunt   . Breast cancer Maternal Grandmother   . Breast cancer Maternal Aunt   . Breast cancer Maternal Aunt     BP 126/72   Pulse 85   Ht 5' 2.5" (1.588 m)   Wt 189 lb (85.7 kg)   SpO2 96%   BMI 34.02 kg/m  Review of Systems Denies weight change and hypoglycemia.     Objective:   Physical Exam VITAL SIGNS:  See vs page GENERAL: no distress NECK: thyroid is approx 3-4 times normal size, but I can't palpate any nodule.     Pulses: dorsalis pedis intact bilat.   MSK: no deformity of the feet.  CV: no leg edema.   Skin:  no ulcer on the feet, but there are several heavy calluses.  normal color and temp on the feet. Neuro: sensation is intact to touch on the feet.   Lab Results  Component Value Date   HGBA1C 6.8 (H) 12/18/2015      Assessment & Plan:  Hyperthyroidism: due for recheck.  Type 2 DM, with TIA: well-controlled.   Goiter: due for recheck.  Patient is advised the following: Patient Instructions  Please continue the same medications for diabetes. check your blood sugar once a day.  vary the time of day when you check, between before the 3 meals, and at bedtime.  also check if you have symptoms of your blood sugar being too high or too low.  please keep a record of the readings and bring it to your next appointment here (or you can bring the meter itself).  You can write it on any piece of paper.  please call us sooner if your blood sugar goes below 70, or if you have a lot of readings over 200.   blood tests are requested for you today.  We'll let you know about the results.   Let's recheck the ultrasound.  you will receive a phone call, about a day and time for an appointment.   Please come back for a follow-up appointment in 4 months

## 2015-12-30 ENCOUNTER — Ambulatory Visit (INDEPENDENT_AMBULATORY_CARE_PROVIDER_SITE_OTHER): Payer: Medicare Other | Admitting: Physician Assistant

## 2015-12-30 ENCOUNTER — Ambulatory Visit (INDEPENDENT_AMBULATORY_CARE_PROVIDER_SITE_OTHER): Payer: Medicare Other | Admitting: Endocrinology

## 2015-12-30 ENCOUNTER — Telehealth: Payer: Self-pay | Admitting: Emergency Medicine

## 2015-12-30 ENCOUNTER — Encounter: Payer: Self-pay | Admitting: Physician Assistant

## 2015-12-30 ENCOUNTER — Encounter: Payer: Self-pay | Admitting: Endocrinology

## 2015-12-30 VITALS — BP 120/72 | HR 64 | Ht 62.75 in | Wt 190.0 lb

## 2015-12-30 VITALS — BP 126/72 | HR 85 | Ht 62.5 in | Wt 189.0 lb

## 2015-12-30 DIAGNOSIS — Z1212 Encounter for screening for malignant neoplasm of rectum: Secondary | ICD-10-CM

## 2015-12-30 DIAGNOSIS — K2100 Gastro-esophageal reflux disease with esophagitis, without bleeding: Secondary | ICD-10-CM

## 2015-12-30 DIAGNOSIS — Z1211 Encounter for screening for malignant neoplasm of colon: Secondary | ICD-10-CM | POA: Diagnosis not present

## 2015-12-30 DIAGNOSIS — Z7901 Long term (current) use of anticoagulants: Secondary | ICD-10-CM | POA: Diagnosis not present

## 2015-12-30 DIAGNOSIS — E049 Nontoxic goiter, unspecified: Secondary | ICD-10-CM | POA: Diagnosis not present

## 2015-12-30 DIAGNOSIS — K5900 Constipation, unspecified: Secondary | ICD-10-CM

## 2015-12-30 LAB — TSH: TSH: 0.8 u[IU]/mL (ref 0.35–4.50)

## 2015-12-30 LAB — T4, FREE: Free T4: 0.63 ng/dL (ref 0.60–1.60)

## 2015-12-30 MED ORDER — LANSOPRAZOLE 30 MG PO CPDR: 30.0000 mg | DELAYED_RELEASE_CAPSULE | Freq: Every day | ORAL | 3 refills | Status: DC

## 2015-12-30 MED ORDER — NA SULFATE-K SULFATE-MG SULF 17.5-3.13-1.6 GM/177ML PO SOLN: 1.0000 | ORAL | 0 refills | Status: AC

## 2015-12-30 NOTE — Progress Notes (Signed)
Chief Complaint: Consult for a screening colonoscopy, constipation and reflux  HPI:  [name] is a 65 year old African-American female with a past medical history of diabetes, DJD, dyslipidemia, GERD, mitral valve prolapse and stroke 2, the last one in March of this year maintained on Plavix and aspirin, who was referred to me by Copland, Gay Filler, MD for a complaint of constipation and reflux as well as need for screening colonoscopy.   Past medical history is positive for colonoscopy in 2003 with Dr. Collene Mares, we do not have report of this today. Though patient reports polyps. She is unsure what kind.   Today, the patient tells me that she is "getting old". She describes that she has chronic constipation for which she typically uses a Colace on a daily basis. Normally this helps her symptoms but she does have occasional constipation on top of this. In fact, this landed her in the ER she tells me a few months ago. She describes that when she is constipated she develops a severe right hip pain which makes it hard to walk. She went to the ER and was told that she was constipated and had a large amount of stool in that area pressing on her hip and nerves. The patient has tried to using MiraLAX in the past but tells me this is "just another thing to remember". The patient tells me she does intentionally drink a lot of water and tries to move around throughout the day as much as she can.   Patient also describes reflux symptoms. 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 She tells me this does not control her symptoms. She will have breakthrough reflux depending on what she eats and typically right after she eats. She describes an instance of eating macaroni last night which seemed  to flare her symptoms.   Patient also tells me today that she is aware that she is due for screening colonoscopy and would like to be scheduled for one.   Patient's social history is positive for losing her husband in May of this year, she does tell me that this has been hard for her.     Patient denies fever, chills, blood in her stool, melena, weight loss, fatigue, anorexia, nausea, vomiting, dysphagia or symptoms that awaken her at night.  Past Medical History:  Diagnosis Date  . Cerebral aneurysm   . Complication of anesthesia HYPOTENSION INTRAOP W/ HYSTERECTOMY IN 1987--  DID OK W/ TOTAL KNEE IN 2008  . Diabetes mellitus   . DJD (degenerative joint disease)   . Dyslipidemia   . GERD (gastroesophageal reflux disease)   . H/O hiatal hernia   . History of CHF (congestive heart failure) 2010-  RESOLVED  . History of peptic ulcer YRS AGO  . Hypertension   . Meniere's disease   . Mitral valve prolapse occasional palpitations w/ chest discomfort  . OA (osteoarthritis)   . Rotator cuff tear, left   . Stroke [facility])     Past Surgical History:  Procedure Laterality Date  . BUNIONECTOMY  2007   LEFT FOOT  . LEFT SHOULDER SURGERY  1997   ROTATOR CUFF REPAIR  . SHOULDER ARTHROSCOPY  09/20/2011   Procedure: ARTHROSCOPY SHOULDER;  Surgeon: Johnn Hai, MD;  Location: [facility];  Service: Orthopedics;  Laterality: Left;  SAD AND MINI OPEN ROTATOR CUFF REPAIR    . TOTAL KNEE ARTHROPLASTY  09-12-2006  [facility]   RIGHT KNEE  . TUBAL LIGATION  1976  . TYMPANOPLASTY  1990;   1980  . VAGINAL HYSTERECTOMY  1987    Current Outpatient Prescriptions  Medication Sig Dispense Refill  . albuterol (PROVENTIL HFA;VENTOLIN HFA) 108 (90 BASE) MCG/ACT inhaler Inhale 2 puffs into the lungs every 6 (six) hours as needed. For shortness of breath.    Marland Kitchen albuterol (PROVENTIL) (2.5 MG/3ML) 0.083% nebulizer solution Take 3 mLs (2.5 mg total) by nebulization every 6 (six) hours as needed for  wheezing or shortness of breath. 150 mL 1  . aspirin EC 325 MG tablet Take 325 mg by mouth daily.    . cetirizine (ZYRTEC) 10 MG tablet Take 10 mg by mouth daily.    . clopidogrel (PLAVIX) 75 MG tablet Take 75 mg by mouth daily.    Marland Kitchen docusate sodium (COLACE) 100 MG capsule Take 100 mg by mouth daily.    . empagliflozin (JARDIANCE) 10 MG TABS tablet Take 5 mg by mouth daily. 45 tablet 3  . furosemide (LASIX) 20 MG tablet TAKE 1 TABLET(20 MG) BY MOUTH DAILY 30 tablet 0  . gabapentin (NEURONTIN) 300 MG capsule Take 2 capsules (600 mg total) by mouth 2 (two) times daily. 60 capsule 5  . HYDROcodone-acetaminophen (NORCO/VICODIN) 5-325 MG tablet Take 1 tablet by mouth every 4 (four) hours as needed. 10 tablet 0  . ipratropium (ATROVENT) 0.03 % nasal spray Place 2 sprays into the nose 4 (four) times daily. (Patient taking differently: Place 2 sprays into the nose every morning. ) 30 mL 6  . linagliptin (TRADJENTA) 5 MG TABS tablet Take 0.5 tablets (2.5 mg total) by mouth daily. 45 tablet 3  . lisinopril (PRINIVIL,ZESTRIL) 20 MG tablet TAKE 1 TABLET BY MOUTH DAILY 30 tablet 0  . LORazepam (ATIVAN) 1 MG tablet Take 1 tablet (1 mg total) by mouth 3 (three) times daily as needed for anxiety. 15 tablet 0  . metFORMIN (GLUCOPHAGE) 500 MG tablet TAKE 2 TABLETS(1000 MG) BY MOUTH TWICE DAILY WITH A MEAL (Patient taking differently: Take 1,000 mg by mouth 2 (two) times daily. TAKE 2 TABLETS(1000 MG) BY MOUTH TWICE DAILY WITH A MEAL) 120 tablet 0  . metoprolol tartrate (LOPRESSOR) 25 MG tablet Take 1 tablet (25 mg total) by mouth 2 (two) times daily. 60 tablet 5  . mometasone (ASMANEX) 220 MCG/INH inhaler Use 1 puff daily followed by rinsing your mouth out with water. 1 Inhaler 12  . QUEtiapine (SEROQUEL) 100 MG tablet Take 50 mg by mouth at bedtime.     . ranitidine (ZANTAC) 75 MG tablet Take 75 mg by mouth at bedtime.    . repaglinide (PRANDIN) 2 MG tablet Take 1 tablet (2 mg total) by mouth 2 (two) times daily  before a meal. 180 tablet 3  . sertraline (ZOLOFT) 50 MG tablet Take 1 tablet (50 mg total) by mouth at bedtime. (Patient taking differently: Take 25 mg by mouth at bedtime. ) 30 tablet 0   No current facility-administered medications for this visit.     Allergies as of 12/30/2015 - Review Complete 12/30/2015  Allergen Reaction Noted  . Influenza vaccines Anaphylaxis 09/16/2011  . Codeine Itching 04/19/2011  . Demerol Other (See Comments) 04/19/2011  . Nsaids Other (See Comments) 04/19/2011  . Prednisone  02/27/2014  . Tramadol  02/27/2014  Family History  Problem Relation Age of Onset  . Heart disease Mother     in her 95s  . Diabetes Mother   . Kidney disease Mother   . Thyroid disease Mother   . Aneurysm Mother   . Ovarian cancer Mother   . Breast cancer Mother   . Thyroid disease Sister   . Heart disease Sister   . Lymphoma Father   . Thyroid disease Brother   . Thyroid disease Paternal Grandmother   . Heart attack Sister     at age 87  . Breast cancer Maternal Aunt   . Breast cancer Maternal Grandmother   . Breast cancer Maternal Aunt   . Breast cancer Maternal Aunt     Social History   Social History  . Marital status: Widowed    Spouse name: N/A  . Number of children: N/A  . Years of education: N/A   Occupational History  . Not on file.   Social History Main Topics  . Smoking status: Never Smoker  . Smokeless tobacco: Never Used  . Alcohol use No  . Drug use: No  . Sexual activity: Not on file   Other Topics Concern  . Not on file   Social History Narrative  . No narrative on file    Review of Systems:    Constitutional: No weight loss, fever or chills HEENT: Eyes: No change in vision               Ears, Nose, Throat:  No change in hearing or congestion Skin: No rash or itching Cardiovascular: No chest pain Respiratory: Positive for shortness of breath  Gastrointestinal: See HPI and otherwise negative Genitourinary: No dysuria or change  in urinary frequency Neurological: No headache or dizziness Musculoskeletal: Positive for chronic back pain and arthritis No new muscle or joint pain Psychiatric: Positive for depression and anxiety   Physical Exam:  Vital signs: BP 120/72 (BP Location: Left Arm, Patient Position: Sitting, Cuff Size: Normal)   Pulse 64   Ht 5' 2.75" (1.594 m)   Wt 190 lb (86.2 kg)   BMI 33.93 kg/m   Constitutional:   Pleasant African-American  female appears to be in NAD, Well developed, Well nourished, alert and cooperative Head:  Normocephalic and atraumatic. Eyes:   PEERL, EOMI. No icterus. Conjunctiva pink. Ears:  Normal auditory acuity. Neck:  Supple Throat: Oral cavity and pharynx without inflammation, swelling or lesion.  Respiratory: Respirations even and unlabored. Lungs clear to auscultation bilaterally.   No wheezes, crackles, or rhonchi.  Cardiovascular: Normal S1, S2. No MRG. Regular rate and rhythm. No peripheral edema, cyanosis or pallor.  Gastrointestinal:  Soft, nondistended, nontender. No rebound or guarding. Normal bowel sounds. No appreciable masses or hepatomegaly. Rectal:  Not performed.  Msk:  Symmetrical without gross deformities. Without edema, no deformity or joint abnormality.  Neurologic:  Alert and  oriented x4;  grossly normal neurologically.  Skin:   Dry and intact without significant lesions or rashes. Psychiatric: Demonstrates good judgement and reason without abnormal affect or behaviors.  MOST RECENT LABS: CBC    Component Value Date/Time   WBC 6.8 12/18/2015 1032   RBC 4.95 12/18/2015 1032   HGB 14.1 12/18/2015 1032   HCT 43.5 12/18/2015 1032   PLT 282.0 12/18/2015 1032   MCV 87.9 12/18/2015 1032   MCV 88.8 06/24/2014 1002   MCH 28.9 04/15/2015 0644   MCHC 32.4 12/18/2015 1032   RDW 14.7 12/18/2015 1032   LYMPHSABS 2.9 04/14/2015  1205   MONOABS 0.4 04/14/2015 1205   EOSABS 0.1 04/14/2015 1205   BASOSABS 0.0 04/14/2015 1205    CMP     Component  Value Date/Time   NA 142 12/18/2015 1032   K 4.1 12/18/2015 1032   CL 107 12/18/2015 1032   CO2 28 12/18/2015 1032   GLUCOSE 116 (H) 12/18/2015 1032   BUN 13 12/18/2015 1032   CREATININE 0.68 12/18/2015 1032   CREATININE 0.86 02/09/2014 1756   CALCIUM 9.4 12/18/2015 1032   PROT 7.0 12/18/2015 1032   ALBUMIN 4.1 12/18/2015 1032   AST 17 12/18/2015 1032   ALT 20 12/18/2015 1032   ALKPHOS 76 12/18/2015 1032   BILITOT 0.6 12/18/2015 1032   GFRNONAA >60 04/15/2015 0644   GFRAA >60 04/15/2015 0644    Assessment: 1. Screening for colorectal cancer: Patient's last colonoscopy as 14 years ago with a finding of "polyps" per patient, she is overdue at this time, she is on chronic anticoagulation with Plavix 2. Constipation: Moderately controlled with one Colace per day, recommend she add MiraLAX and titrate as needed 3. GERD: Was controlled previously on Omeprazole 20 mg daily, came off of this medication and switched to Ranitidine by herself about 3 months ago when she started her Plavix due to contraindications that she read about, now with breakthrough reflux symptoms 4. Chronic Anticoagulation: With Plavix and ASA for h/o Stroke x2  Plan: 1. Scheduled the patient for screening colonoscopy with Dr. Silverio Decamp as she is supervising physician today. Discussed risks, benefits, limitations and alternatives and the patient agrees to proceed. This is scheduled in the [place].  2. Patient advised to hold her Plavix for 5 days prior to her procedure. We will communicate with her doctor to ensure that holding her Plavix is acceptable. 3. Patient advised to continue her daily Colace and add MiraLAX titrated to soft regular bowel movement to avoid days of constipation 4. Advised patient to stop her Zantac. Prescribed Lansoprazole 30 mg daily, 30-60 minutes before eating. 5. Patient to follow in clinic per recommendations from Dr. Lemar Lofty, PA-C [facility] 12/30/2015, 9:44  AM  Cc: Darreld Mclean, MD

## 2015-12-30 NOTE — Patient Instructions (Addendum)
You have been scheduled for a colonoscopy. Please follow written instructions given to you at your visit today.  Please pick up your prep supplies at the pharmacy within the next 1-3 days. If you use inhalers (even only as needed), please bring them with you on the day of your procedure. Your physician has requested that you go to www.startemmi.com and enter the access code given to you at your visit today. This web site gives a general overview about your procedure. However, you should still follow specific instructions given to you by our office regarding your preparation for the procedure.  We have sent the following medications to your pharmacy for you to pick up at your convenience: Lansoprazole 30 mg daily 30-60 mins before eating.

## 2015-12-30 NOTE — Patient Instructions (Addendum)
Please continue the same medications for diabetes. check your blood sugar once a day.  vary the time of day when you check, between before the 3 meals, and at bedtime.  also check if you have symptoms of your blood sugar being too high or too low.  please keep a record of the readings and bring it to your next appointment here (or you can bring the meter itself).  You can write it on any piece of paper.  please call us sooner if your blood sugar goes below 70, or if you have a lot of readings over 200.   blood tests are requested for you today.  We'll let you know about the results.   Let's recheck the ultrasound.  you will receive a phone call, about a day and time for an appointment.   Please come back for a follow-up appointment in 4 months

## 2015-12-30 NOTE — Telephone Encounter (Addendum)
12/30/2015   RE: Sherry [name] DOB: Mar 29, 1950 MRN: [id number]   Dear Dr. Lorelei Pont,    We have scheduled the above patient for an endoscopic procedure. Our records show that she is on anticoagulation therapy.   Please advise as to how long the patient may come off her therapy of Plavix prior to the procedure, which is scheduled for 03/05/16.  Please fax back/ or route the completed form to Tinnie Gens, [place].   Sincerely,    Tinnie Gens, [place]

## 2015-12-31 NOTE — Progress Notes (Signed)
Reviewed and agree with documentation and assessment and plan. K. Veena Nandigam , MD   

## 2016-01-08 ENCOUNTER — Other Ambulatory Visit: Payer: Self-pay | Admitting: Endocrinology

## 2016-01-08 ENCOUNTER — Other Ambulatory Visit (HOSPITAL_COMMUNITY): Payer: Self-pay | Admitting: Family Medicine

## 2016-01-08 DIAGNOSIS — F320 Major depressive disorder, single episode, mild: Secondary | ICD-10-CM

## 2016-01-08 MED ORDER — FUROSEMIDE 20 MG PO TABS: ORAL_TABLET | ORAL | 2 refills | Status: DC

## 2016-01-08 MED ORDER — LISINOPRIL 20 MG PO TABS: 20.0000 mg | ORAL_TABLET | Freq: Every day | ORAL | 2 refills | Status: DC

## 2016-01-08 MED FILL — LISINOPRIL 20 MG TABLET: 20 | 30 days supply | Qty: 30 | Fill #0

## 2016-01-08 MED FILL — FUROSEMIDE 20 MG TABLET: 20 | 30 days supply | Qty: 30 | Fill #0

## 2016-01-09 ENCOUNTER — Encounter: Payer: Self-pay | Admitting: Family Medicine

## 2016-01-12 ENCOUNTER — Other Ambulatory Visit: Payer: Self-pay

## 2016-01-12 DIAGNOSIS — E118 Type 2 diabetes mellitus with unspecified complications: Secondary | ICD-10-CM

## 2016-01-12 DIAGNOSIS — F320 Major depressive disorder, single episode, mild: Secondary | ICD-10-CM

## 2016-01-12 MED ORDER — QUETIAPINE FUMARATE 100 MG PO TABS: 50.0000 mg | ORAL_TABLET | Freq: Every day | ORAL | 3 refills | Status: DC

## 2016-01-12 MED ORDER — CETIRIZINE HCL 10 MG PO TABS: 10.0000 mg | ORAL_TABLET | Freq: Every day | ORAL | 3 refills | Status: DC

## 2016-01-12 MED ORDER — LANSOPRAZOLE 30 MG PO CPDR: 30.0000 mg | DELAYED_RELEASE_CAPSULE | Freq: Every day | ORAL | 11 refills | Status: DC

## 2016-01-12 MED ORDER — CLOPIDOGREL BISULFATE 75 MG PO TABS: 75.0000 mg | ORAL_TABLET | Freq: Every day | ORAL | 3 refills | Status: DC

## 2016-01-12 MED ORDER — METFORMIN HCL 500 MG PO TABS: 1000.0000 mg | ORAL_TABLET | Freq: Two times a day (BID) | ORAL | 3 refills | Status: DC

## 2016-01-12 MED ORDER — GABAPENTIN 300 MG PO CAPS: 600.0000 mg | ORAL_CAPSULE | Freq: Two times a day (BID) | ORAL | 1 refills | Status: DC

## 2016-01-12 MED ORDER — SERTRALINE HCL 50 MG PO TABS: 25.0000 mg | ORAL_TABLET | Freq: Every day | ORAL | 3 refills | Status: DC

## 2016-01-12 MED FILL — LANSOPRAZOLE DR 30 MG CAP: 30 | 30 days supply | Qty: 30 | Fill #0

## 2016-01-12 NOTE — Telephone Encounter (Signed)
Patient requesting medications  Last seen by you 12/18/15 You have not prescribed medications.  Please advise, PC

## 2016-01-14 ENCOUNTER — Ambulatory Visit (INDEPENDENT_AMBULATORY_CARE_PROVIDER_SITE_OTHER): Payer: Medicare Other | Admitting: Orthopedic Surgery

## 2016-01-14 ENCOUNTER — Encounter (INDEPENDENT_AMBULATORY_CARE_PROVIDER_SITE_OTHER): Payer: Self-pay | Admitting: Orthopedic Surgery

## 2016-01-14 ENCOUNTER — Ambulatory Visit (INDEPENDENT_AMBULATORY_CARE_PROVIDER_SITE_OTHER): Payer: Medicare Other

## 2016-01-14 DIAGNOSIS — M25551 Pain in right hip: Secondary | ICD-10-CM | POA: Insufficient documentation

## 2016-01-14 DIAGNOSIS — M5441 Lumbago with sciatica, right side: Secondary | ICD-10-CM | POA: Diagnosis not present

## 2016-01-15 ENCOUNTER — Ambulatory Visit
Admission: RE | Admit: 2016-01-15 | Discharge: 2016-01-15 | Disposition: A | Discharge status: Home / Self Care | Payer: Medicare Other | Source: Ambulatory Visit | Attending: Endocrinology | Admitting: Endocrinology

## 2016-01-15 DIAGNOSIS — E042 Nontoxic multinodular goiter: Secondary | ICD-10-CM | POA: Diagnosis not present

## 2016-01-15 DIAGNOSIS — E049 Nontoxic goiter, unspecified: Secondary | ICD-10-CM

## 2016-01-15 NOTE — Progress Notes (Signed)
Office Visit Note   Patient: Sherry [name]           Date of Birth: [birth year]           MRN: [id number] Visit Date: 01/14/2016 Requested by: Darreld Mclean, MD [address] [place], [place] [address] PCP: [name], MD  Subjective: Chief Complaint  Patient presents with  . Right Hip - Pain  . Lower Back - Pain    HPI Sherry [name] is a 65 year old patient with a 2 month history of right-sided low back pain radiating around to the groin.  She states that she stiff and she can "hardly move".  She had a right total knee replacement done 8 years ago.  States that she "is falling".  When she lays in bed she has some spasms and achy pains in the right leg.  This does wake her from sleep.  She doesn't take any nonsteroidals for this because of heart palpitations.  She is not a smoker and she doesn't trial much.  She is on aspirin and Plavix since March 2017.  She lives by herself.  It is hard for her to go from sitting to standing.              Review of Systems All systems reviewed are negative as they relate to the chief complaint within the history of present illness.  Patient denies  fevers or chills.    Assessment & Plan: Visit Diagnoses:  1. Acute back pain with sciatica, right   2. Right hip pain     Plan: Impression is low back pain with radiation into the leg.  This looks like sciatica.  Hip x-rays show maintenance of the joint space but there are mild bilateral degenerative spurs present at the inferior aspect of the femoral head.  I think more likely than not this is originating from her lumbar spine.  MRI scan is pending.  Follow-Up Instructions: Return for after MRI.   Orders:  Orders Placed This Encounter  Procedures  . XR Lumbar Spine 2-3 Views   No orders of the defined types were placed in this encounter.     Procedures: No procedures performed   Clinical Data: No additional findings.  Objective: Vital Signs: There were no vitals  taken for this visit.  Physical Exam   Constitutional: Patient appears well-developed HEENT:  Head: Normocephalic Eyes:EOM are normal Neck: Normal range of motion Cardiovascular: Normal rate Pulmonary/chest: Effort normal Neurologic: Patient is alert Skin: Skin is warm Psychiatric: Patient has normal mood and affect    Ortho Exam on orthopedic exam she really doesn't have much in the way of groin pain with internal/external rotation of either leg.  She does have an antalgic gait to the right.  She has good ankle dorsiflexion plantar flexion quad hamstring strength with palpable pedal pulses.  Mild paresthesias on the right L5 distribution compared to the left.  Reflexes symmetric.  Negative Babinski negative clonus.  No real trochanteric tenderness is noted on either side.  Specialty Comments:  No specialty comments available.  Imaging: No results found.   PMFS History: Patient Active Problem List   Diagnosis Date Noted  . Acute back pain with sciatica, right 01/14/2016  . Right hip pain 01/14/2016  . Vertigo 04/14/2015  . Anxiety state 01/22/2015  . HLD (hyperlipidemia) 01/22/2015  . Type 2 diabetes mellitus with circulatory disorder ([place]) 01/22/2015  . Intracranial vascular stenosis 01/22/2015  . Aneurysm ([place])   . Headache   .  Transient cerebral ischemia   . Diabetes mellitus with complication ([place]) 0000000  . Essential hypertension 11/12/2014  . Dependent edema 11/12/2014  . Peripheral neuropathy ([place]) 11/12/2014  . Depression 11/12/2014  . Facial droop   . Chest pain   . TIA (transient ischemic attack) 11/11/2014  . Dyspnea 08/06/2014  . Hyperthyroidism 08/06/2014  . Meniere's disease 06/07/2014  . History of CHF (congestive heart failure) 06/07/2014  . Essential hypertension, benign 06/13/2013  . Type II or unspecified type diabetes mellitus without mention of complication, not stated as uncontrolled 06/13/2013  . Goiter 06/13/2013  . Obesity,  unspecified 06/13/2013   Past Medical History:  Diagnosis Date  . Cerebral aneurysm   . Complication of anesthesia HYPOTENSION INTRAOP W/ HYSTERECTOMY IN 1987--  DID OK W/ TOTAL KNEE IN 2008  . Diabetes mellitus   . DJD (degenerative joint disease)   . Dyslipidemia   . GERD (gastroesophageal reflux disease)   . H/O hiatal hernia   . History of CHF (congestive heart failure) 2010-  RESOLVED  . History of peptic ulcer YRS AGO  . Hypertension   . Meniere's disease   . Mitral valve prolapse occasional palpitations w/ chest discomfort  . OA (osteoarthritis)   . Rotator cuff tear, left   . Stroke [facility])     Family History  Problem Relation Age of Onset  . Heart disease Mother     in her 67s  . Diabetes Mother   . Kidney disease Mother   . Thyroid disease Mother   . Aneurysm Mother   . Ovarian cancer Mother   . Breast cancer Mother   . Thyroid disease Sister   . Heart disease Sister   . Lymphoma Father   . Thyroid disease Brother   . Thyroid disease Paternal Grandmother   . Heart attack Sister     at age 15  . Breast cancer Maternal Aunt   . Breast cancer Maternal Grandmother   . Breast cancer Maternal Aunt   . Breast cancer Maternal Aunt     Past Surgical History:  Procedure Laterality Date  . BUNIONECTOMY  2007   LEFT FOOT  . LEFT SHOULDER SURGERY  1997   ROTATOR CUFF REPAIR  . SHOULDER ARTHROSCOPY  09/20/2011   Procedure: ARTHROSCOPY SHOULDER;  Surgeon: Johnn Hai, MD;  Location: [facility];  Service: Orthopedics;  Laterality: Left;  SAD AND MINI OPEN ROTATOR CUFF REPAIR    . TOTAL KNEE ARTHROPLASTY  09-12-2006  [facility]   RIGHT KNEE  . TUBAL LIGATION  1976  . TYMPANOPLASTY  1990;   1980  . VAGINAL HYSTERECTOMY  1987   Social History   Occupational History  . Not on file.   Social History Main Topics  . Smoking status: Never Smoker  . Smokeless tobacco: Never Used  . Alcohol use No  . Drug use: No  . Sexual activity: Not on file

## 2016-01-19 NOTE — Telephone Encounter (Signed)
What is the question here?

## 2016-01-20 MED FILL — CLOPIDOGREL 75 MG TABLET: 75 | 90 days supply | Qty: 90 | Fill #0

## 2016-01-20 MED FILL — metFORMIN HCL 500 MG TABS: 500 | 90 days supply | Qty: 360 | Fill #0

## 2016-01-21 ENCOUNTER — Other Ambulatory Visit (INDEPENDENT_AMBULATORY_CARE_PROVIDER_SITE_OTHER): Payer: Self-pay | Admitting: Orthopedic Surgery

## 2016-01-21 DIAGNOSIS — M5441 Lumbago with sciatica, right side: Secondary | ICD-10-CM

## 2016-01-22 ENCOUNTER — Telehealth: Payer: Self-pay | Admitting: Family Medicine

## 2016-01-22 MED FILL — SERTRALINE HCL 50 MG TABLET: 50 | 90 days supply | Qty: 45 | Fill #0

## 2016-01-22 NOTE — Telephone Encounter (Signed)
Please advise    PC 

## 2016-01-22 NOTE — Telephone Encounter (Signed)
°  Relation to [id number] Call back number:[phone] Pharmacy:[facility]  Reason for call: pt states that the pharmacy informed her to make Dr. Lorelei Pont aware that the rx clopidogrel (PLAVIX) 75 MG tablet, interacts with repaglinide and causes very low blood glucoses. Pt states that her sugar has been running in the 50's on a regular since she has been taking both meds. Please advise pt what to do.

## 2016-01-22 NOTE — Telephone Encounter (Signed)
Dr Loanne Drilling is treating her DM now- sheis on jardiance, tradjenta, metformin, prandin  She states that she just started taking the prandin about a month ago. She was NOT taking this at her last a1c as below. Advised her that it looks like she does not need the prandin and she should stop it since it is causing hypoglycemia.  She will stop taking it   Lab Results  Component Value Date   HGBA1C 6.8 (H) 12/18/2015

## 2016-01-30 ENCOUNTER — Ambulatory Visit
Admission: RE | Admit: 2016-01-30 | Discharge: 2016-01-30 | Disposition: A | Discharge status: Home / Self Care | Payer: Medicare Other | Source: Ambulatory Visit | Attending: Orthopedic Surgery | Admitting: Orthopedic Surgery

## 2016-01-30 DIAGNOSIS — M5441 Lumbago with sciatica, right side: Secondary | ICD-10-CM

## 2016-01-30 DIAGNOSIS — M5126 Other intervertebral disc displacement, lumbar region: Secondary | ICD-10-CM | POA: Diagnosis not present

## 2016-02-03 ENCOUNTER — Other Ambulatory Visit: Payer: Self-pay | Admitting: Emergency Medicine

## 2016-02-03 DIAGNOSIS — I10 Essential (primary) hypertension: Secondary | ICD-10-CM

## 2016-02-03 MED ORDER — METOPROLOL TARTRATE 25 MG PO TABS: 25.0000 mg | ORAL_TABLET | Freq: Two times a day (BID) | ORAL | 5 refills | Status: DC

## 2016-02-03 MED FILL — GABAPENTIN 300 MG CAPSULE: 300 | 45 days supply | Qty: 180 | Fill #0

## 2016-02-03 MED FILL — METOPROLOL TARTRATE 25 MG T: 25 | 30 days supply | Qty: 60 | Fill #0

## 2016-02-16 MED FILL — QUETIAPINE FUMARATE 100 MG: 100 | 90 days supply | Qty: 45 | Fill #0

## 2016-02-16 MED FILL — FUROSEMIDE 20 MG TABLET: 20 | 30 days supply | Qty: 30 | Fill #1

## 2016-02-16 MED FILL — LISINOPRIL 20 MG TABLET: 20 | 30 days supply | Qty: 30 | Fill #1

## 2016-02-17 ENCOUNTER — Telehealth: Payer: Self-pay

## 2016-02-17 NOTE — Telephone Encounter (Signed)
PA initiated via Covermymeds; KEY: [id number]. Awaiting determination.

## 2016-02-18 ENCOUNTER — Ambulatory Visit (INDEPENDENT_AMBULATORY_CARE_PROVIDER_SITE_OTHER): Payer: Medicare Other | Admitting: Orthopedic Surgery

## 2016-02-18 ENCOUNTER — Encounter (INDEPENDENT_AMBULATORY_CARE_PROVIDER_SITE_OTHER): Payer: Self-pay | Admitting: Orthopedic Surgery

## 2016-02-18 DIAGNOSIS — M5441 Lumbago with sciatica, right side: Secondary | ICD-10-CM | POA: Diagnosis not present

## 2016-02-18 MED FILL — SUPREP BOWEL PREP KIT: 17.5-3.13-1 | 1 days supply | Qty: 354 | Fill #0

## 2016-02-18 NOTE — Telephone Encounter (Signed)
PA: Lansoprazole was denied due to drug not being on the drug list (formulary) Denial sent to provider for review.

## 2016-02-18 NOTE — Progress Notes (Signed)
Office Visit Note   Patient: Sherry [name]           Date of Birth: 01-26-1951           MRN: [id number] Visit Date: 02/18/2016 Requested by: Darreld Mclean, MD [address] [place], [place] [address] PCP: [name], MD  Subjective: Chief Complaint  Patient presents with  . Lower Back - Pain    HPI Dion is a 66 year old patient with low back pain.  She is here to review the MRI scan.  That scan shows T12-L1 degenerative disc disease with a little bit of edema in the vertebral bodies at that location.  She also has some facet edema at L5-S1 on the right-hand side.  She has had epidural steroid injections before.  She does live by herself.  Bowel movements cause her pain.  She's not having any fever and chills.  The taking any medication.              Review of Systems All systems reviewed are negative as they relate to the chief complaint within the history of present illness.  Patient denies  fevers or chills.    Assessment & Plan: Visit Diagnoses:  1. Acute back pain with sciatica, right     Plan: Impression is low back pain with enough pathology to account for her symptoms.  She does have some T12-L1 vertebral body degenerative disc disease with some bulging disc at that level.  Also has on the right side edema in the facet joints at L5-S1.  I think that also could be a pain generator.  I would like for her to consult with Dr. Ernestina Patches for a series of 1-2-3 injections to see if he can localize her pain and improve the symptoms.  She has done a lot of physical therapy in the past and has done these but they have not helped.  She knows these exercises.  I will see her back as needed  Follow-Up Instructions: No Follow-up on file.   Orders:  Orders Placed This Encounter  Procedures  . Ambulatory referral to Physical Medicine Rehab   No orders of the defined types were placed in this encounter.     Procedures: No procedures performed   Clinical  Data: No additional findings.  Objective: Vital Signs: There were no vitals taken for this visit.  Physical Exam   Constitutional: Patient appears well-developed HEENT:  Head: Normocephalic Eyes:EOM are normal Neck: Normal range of motion Cardiovascular: Normal rate Pulmonary/chest: Effort normal Neurologic: Patient is alert Skin: Skin is warm Psychiatric: Patient has normal mood and affect    Ortho Exam orthopedic exam demonstrates no nerve retention signs general apprehension about any type of examination maneuver.  She does have good ankle dorsi flexion plantar flexion strength no paresthesias L1 S1 bilaterally today.  Pain with forward lateral bending is present.  No groin pain with internal/external rotation leg.  No other masses lymph adenopathy or skin changes noted in the leg region back region.  Specialty Comments:  No specialty comments available.  Imaging: No results found.   PMFS History: Patient Active Problem List   Diagnosis Date Noted  . Acute back pain with sciatica, right 01/14/2016  . Right hip pain 01/14/2016  . Vertigo 04/14/2015  . Anxiety state 01/22/2015  . HLD (hyperlipidemia) 01/22/2015  . Type 2 diabetes mellitus with circulatory disorder ([place]) 01/22/2015  . Intracranial vascular stenosis 01/22/2015  . Aneurysm ([place])   . Headache   .  Transient cerebral ischemia   . Diabetes mellitus with complication ([place]) 0000000  . Essential hypertension 11/12/2014  . Dependent edema 11/12/2014  . Peripheral neuropathy ([place]) 11/12/2014  . Depression 11/12/2014  . Facial droop   . Chest pain   . TIA (transient ischemic attack) 11/11/2014  . Dyspnea 08/06/2014  . Hyperthyroidism 08/06/2014  . Meniere's disease 06/07/2014  . History of CHF (congestive heart failure) 06/07/2014  . Essential hypertension, benign 06/13/2013  . Type II or unspecified type diabetes mellitus without mention of complication, not stated as uncontrolled 06/13/2013  . Goiter  06/13/2013  . Obesity, unspecified 06/13/2013   Past Medical History:  Diagnosis Date  . Cerebral aneurysm   . Complication of anesthesia HYPOTENSION INTRAOP W/ HYSTERECTOMY IN 1987--  DID OK W/ TOTAL KNEE IN 2008  . Diabetes mellitus   . DJD (degenerative joint disease)   . Dyslipidemia   . GERD (gastroesophageal reflux disease)   . H/O hiatal hernia   . History of CHF (congestive heart failure) 2010-  RESOLVED  . History of peptic ulcer YRS AGO  . Hypertension   . Meniere's disease   . Mitral valve prolapse occasional palpitations w/ chest discomfort  . OA (osteoarthritis)   . Rotator cuff tear, left   . Stroke [facility])     Family History  Problem Relation Age of Onset  . Heart disease Mother     in her 40s  . Diabetes Mother   . Kidney disease Mother   . Thyroid disease Mother   . Aneurysm Mother   . Ovarian cancer Mother   . Breast cancer Mother   . Thyroid disease Sister   . Heart disease Sister   . Lymphoma Father   . Thyroid disease Brother   . Thyroid disease Paternal Grandmother   . Heart attack Sister     at age 30  . Breast cancer Maternal Aunt   . Breast cancer Maternal Grandmother   . Breast cancer Maternal Aunt   . Breast cancer Maternal Aunt     Past Surgical History:  Procedure Laterality Date  . BUNIONECTOMY  2007   LEFT FOOT  . LEFT SHOULDER SURGERY  1997   ROTATOR CUFF REPAIR  . SHOULDER ARTHROSCOPY  09/20/2011   Procedure: ARTHROSCOPY SHOULDER;  Surgeon: Johnn Hai, MD;  Location[facility];  Service: Orthopedics;  Laterality: Left;  SAD AND MINI OPEN ROTATOR CUFF REPAIR    . TOTAL KNEE ARTHROPLASTY  09-12-2006  [facility]   RIGHT KNEE  . TUBAL LIGATION  1976  . TYMPANOPLASTY  1990;   1980  . VAGINAL HYSTERECTOMY  1987   Social History   Occupational History  . Not on file.   Social History Main Topics  . Smoking status: Never Smoker  . Smokeless tobacco: Never Used  . Alcohol use No  . Drug use: No  . Sexual  activity: Not on file

## 2016-02-20 ENCOUNTER — Encounter: Payer: Self-pay | Admitting: Family Medicine

## 2016-02-20 ENCOUNTER — Encounter (INDEPENDENT_AMBULATORY_CARE_PROVIDER_SITE_OTHER): Payer: Self-pay | Admitting: Physical Medicine and Rehabilitation

## 2016-02-21 ENCOUNTER — Encounter: Payer: Self-pay | Admitting: Physician Assistant

## 2016-02-22 ENCOUNTER — Other Ambulatory Visit: Payer: Self-pay | Admitting: Family Medicine

## 2016-02-22 ENCOUNTER — Encounter: Payer: Self-pay | Admitting: Family Medicine

## 2016-02-22 MED ORDER — OMEPRAZOLE 20 MG PO CPDR: 20.0000 mg | DELAYED_RELEASE_CAPSULE | Freq: Every day | ORAL | 12 refills | Status: DC

## 2016-02-23 NOTE — Telephone Encounter (Signed)
Spoke to patient and rescheduled procedure. Went over procedure instructions again with new times.

## 2016-02-24 ENCOUNTER — Encounter: Payer: Self-pay | Admitting: Family Medicine

## 2016-02-24 ENCOUNTER — Telehealth: Payer: Self-pay | Admitting: *Deleted

## 2016-02-24 NOTE — Telephone Encounter (Signed)
Working on this- it is unclear why she is on plavix, was not using as per her last neurology noted 05/2015.  Pt reports it was started by an NP at the [facility] in the meantime

## 2016-02-24 NOTE — Telephone Encounter (Signed)
Conversation: lansoprazole coverage  (Newest Message First)  Darreld Mclean, MD  to Darci Needle   02/22/16 6:26 AM  Hi Naamah- your lansoprazole is not covered by your insurance. We can try a similar medication - they do cover dexilant, esomeprazole and omeprazole. I think that one of these alternative drugs will likely work fine. I'll go ahead and change your rx to omeprazole- let me know if you have any problems or concerns. Also please bear in mind that this class of stomach medication can decrease the efficacy of your plavix. If you do not really need to continue taking the stomach med you should stop using it. Have you tried stopping it for a few days to see if you have any reflux? Please let me know!  JC    Last read by Darci Needle at 10:24 AM on 02/22/2016

## 2016-02-25 ENCOUNTER — Encounter (INDEPENDENT_AMBULATORY_CARE_PROVIDER_SITE_OTHER): Payer: Medicare Other | Admitting: Physical Medicine and Rehabilitation

## 2016-02-25 ENCOUNTER — Encounter: Payer: Self-pay | Admitting: Family Medicine

## 2016-02-25 ENCOUNTER — Encounter (INDEPENDENT_AMBULATORY_CARE_PROVIDER_SITE_OTHER): Payer: Self-pay | Admitting: Physical Medicine and Rehabilitation

## 2016-02-26 ENCOUNTER — Encounter: Payer: Medicare Other | Admitting: Gastroenterology

## 2016-02-27 ENCOUNTER — Telehealth: Payer: Self-pay | Admitting: Emergency Medicine

## 2016-02-27 NOTE — Telephone Encounter (Signed)
Spoke to patient and she verbalized understanding of coming off Plavix for 5 day and to take ASA 81 mg in the meantime. Patient understands the risk associated with stopping Plavix. She agrees to look out for any warning signs.

## 2016-02-27 NOTE — Telephone Encounter (Signed)
-----   Message from Darreld Mclean, MD sent at 02/25/2016  7:49 PM EST ----- I asked Aven's neurologist about her plavix- here is his reply:  She had two episodes of TIA vs. Anxiety in the past. Her intracranial vessel showed no high grade stenosis. We put her on ASA. For her stroke prevention, either ASA or plavix is OK. I do not see any indication for long term dual antiplatelet therapy.   For endoscopic procedure, if GI doc hates plavix, she may stop plavix 5 days prior to the procedure. She just has to be aware that it carries a small but acceptable peri-operative risk for TIA/stroke while off plavix. If GI doc is OK, we recommend ASA 81mg  daily for the days pt is off plavix. plavix can be resumed post-procedure if hemodynamically stable.   Jindong    So basically stop plavix 5 days prior to procedure but continue asa 81 while off plavix.  Can go back on plavix post- procedure.  I'll alert the patient that her neurologist does not think she needs to be on both plavix and aspirin

## 2016-03-01 ENCOUNTER — Ambulatory Visit (INDEPENDENT_AMBULATORY_CARE_PROVIDER_SITE_OTHER): Payer: Medicare Other | Admitting: Physical Medicine and Rehabilitation

## 2016-03-01 ENCOUNTER — Encounter (INDEPENDENT_AMBULATORY_CARE_PROVIDER_SITE_OTHER): Payer: Self-pay | Admitting: Physical Medicine and Rehabilitation

## 2016-03-01 VITALS — BP 142/74 | HR 71 | Temp 98.0°F

## 2016-03-01 DIAGNOSIS — M47816 Spondylosis without myelopathy or radiculopathy, lumbar region: Secondary | ICD-10-CM

## 2016-03-01 MED ORDER — LIDOCAINE HCL (PF) 1 % IJ SOLN: 0.3300 mL | Freq: Once | INTRAMUSCULAR | Status: DC

## 2016-03-01 MED ORDER — METHYLPREDNISOLONE ACETATE 80 MG/ML IJ SUSP
80.0000 mg | Freq: Once | INTRAMUSCULAR | Status: AC
  Administered 2016-03-01: 80 mg

## 2016-03-01 NOTE — Procedures (Signed)
Lumbar Facet Joint Intra-Articular Injection(s) with Fluoroscopic Guidance  Patient: Sherry [name]      Date of Birth: 03/19/1950 MRN: [id number] PCP: [name], MD      Visit Date: 03/01/2016   Universal Protocol:    Date/Time: 01/22/189:17 AM  Consent Given By: the patient  Position: PRONE   Additional Comments: Vital signs were monitored before and after the procedure. Patient was prepped and draped in the usual sterile fashion. The correct patient, procedure, and site was verified.   Injection Procedure Details:  Procedure Site One Meds Administered:  Meds ordered this encounter  Medications  . lidocaine (PF) (XYLOCAINE) 1 % injection 0.3 mL  . methylPREDNISolone acetate (DEPO-MEDROL) injection 80 mg     Laterality: Right  Location/Site:  L5-S1  Needle size: 22 guage  Needle type: Spinal  Needle Placement: Articular  Findings:  -Contrast Used: 1 mL iohexol 180 mg iodine/mL   -Comments: Excellent flow of contrast producing a partial arthrogram.  Procedure Details: The fluoroscope beam is vertically oriented in AP, and the inferior recess is visualized beneath the lower pole of the inferior apophyseal process, which represents the target point for needle insertion. When direct visualization is difficult the target point is located at the medial projection of the vertebral pedicle. The region overlying each aforementioned target is locally anesthetized with a 1 to 2 ml. volume of 1% Lidocaine without Epinephrine.   The spinal needle was inserted into each of the above mentioned facet joints using biplanar fluoroscopic guidance. A 0.25 to 0.5 ml. volume of Isovue-250 was injected and a partial facet joint arthrogram was obtained. A single spot film was obtained of the resulting arthrogram.    One to 1.25 ml of the steroid/anesthetic solution was then injected into each of the facet joints noted above.   Additional Comments:  The patient tolerated the  procedure well Dressing: Band-Aid    Post-procedure details: Patient was observed during the procedure. Post-procedure instructions were reviewed.  Patient left the clinic in stable condition.

## 2016-03-01 NOTE — Patient Instructions (Signed)

## 2016-03-01 NOTE — Progress Notes (Signed)
Sherry [name] - 66 y.o. female MRN [id number]  Date of birth: [birth year]  Office Visit Note: Visit Date: 03/01/2016 PCP: [name], MD Referred by: Darreld Mclean, MD  Subjective: Chief Complaint  Patient presents with  . Lower Back - Pain  . Middle Back - Pain   HPI: Sherry [name] is a 66 year old female chronic worsening severe Mid to lower back pain for several months. Stated it started in right hip and into groin. Shooting pain down right leg mainly at night with laying down. Stiffness with sitting long periods. Denies numbness and tingling. She points to most of her pain in the right lower back area. She has difficulty arising from a seated position. Pain with extension. MRI findings are reviewed below but do show edema at L5-S1. There is facet arthropathy at L4-5 hand L5-S1.    ROS Otherwise per HPI.  Assessment & Plan: Visit Diagnoses:  1. Spondylosis without myelopathy or radiculopathy, lumbar region     Plan: Findings:  Right L5-S1 facet joint block. This would be diagnostic and hopefully therapeutic. She does have quite a bit of facet arthritis of the lumbar spine. Depending on her relief would look at L4-5 position versus an epidural injection. She's not anything in the way of paresthesias or dysesthesias. She does get some referral hip and thigh.    Meds & Orders:  Meds ordered this encounter  Medications  . lidocaine (PF) (XYLOCAINE) 1 % injection 0.3 mL  . methylPREDNISolone acetate (DEPO-MEDROL) injection 80 mg    Orders Placed This Encounter  Procedures  . Facet Injection    Follow-up: Return if symptoms worsen or fail to improve, for scheduled follow up with Dr. Marlou Sa.   Procedures: No procedures performed  Lumbar Facet Joint Intra-Articular Injection(s) with Fluoroscopic Guidance  Patient: Sherry [name]      Date of Birth: 09-14-1950 MRN: [id number] PCP: [name], MD      Visit Date: 03/01/2016   Universal Protocol:      Date/Time: 01/22/189:17 AM  Consent Given By: the patient  Position: PRONE   Additional Comments: Vital signs were monitored before and after the procedure. Patient was prepped and draped in the usual sterile fashion. The correct patient, procedure, and site was verified.   Injection Procedure Details:  Procedure Site One Meds Administered:  Meds ordered this encounter  Medications  . lidocaine (PF) (XYLOCAINE) 1 % injection 0.3 mL  . methylPREDNISolone acetate (DEPO-MEDROL) injection 80 mg     Laterality: Right  Location/Site:  L5-S1  Needle size: 22 guage  Needle type: Spinal  Needle Placement: Articular  Findings:  -Contrast Used: 1 mL iohexol 180 mg iodine/mL   -Comments: Excellent flow of contrast producing a partial arthrogram.  Procedure Details: The fluoroscope beam is vertically oriented in AP, and the inferior recess is visualized beneath the lower pole of the inferior apophyseal process, which represents the target point for needle insertion. When direct visualization is difficult the target point is located at the medial projection of the vertebral pedicle. The region overlying each aforementioned target is locally anesthetized with a 1 to 2 ml. volume of 1% Lidocaine without Epinephrine.   The spinal needle was inserted into each of the above mentioned facet joints using biplanar fluoroscopic guidance. A 0.25 to 0.5 ml. volume of Isovue-250 was injected and a partial facet joint arthrogram was obtained. A single spot film was obtained of the resulting arthrogram.    One to 1.25 ml of the steroid/anesthetic solution was then injected into each  of the facet joints noted above.   Additional Comments:  The patient tolerated the procedure well Dressing: Band-Aid    Post-procedure details: Patient was observed during the procedure. Post-procedure instructions were reviewed.  Patient left the clinic in stable condition.     Clinical History: Lumbar  spine MRI dated 01/30/2016  T10-11: Mild left foraminal stenosis due to left foraminal disc protrusion and facet arthropathy. This level is only included on the parasagittal images.   T11-12: No impingement. Diffuse disc bulge. This level is only included on the parasagittal images.   T12-L1: Borderline central narrowing of the thecal sac due to central disc protrusion extending caudad.   L1-2: Mild right foraminal stenosis due to intervertebral spurring and diffuse disc bulge. Central disc protrusion.   L2-3: No impingement. Mild disc bulge.   L3-4: No impingement. Mild disc bulge.   L4-5: Mild disc bulge and bilateral facet arthropathy.   L5-S1: Mild right and borderline left foraminal stenosis due to disc bulge, intervertebral spurring, and facet arthropathy.   IMPRESSION: 1. Lower thoracic and lumbar spondylosis and degenerative disc disease, resulting in mild impingement at T10-11, L1-2, L4-5, and L5-S1, as detailed above. 2. There is facet edema at L5-S1, especially at the base of the superior articular facet of S1 where the degree of edema may rise to the level of being stress reaction or early stress fracture.     She reports that she has never smoked. She has never used smokeless tobacco.   Recent Labs  04/15/15 0644 12/18/15 1032  HGBA1C 9.7* 6.8*    Objective:  VS:  HT:    WT:   BMI:     BP:(!) 142/74  HR:71bpm  TEMP:98 F (36.7 C)(Oral)  RESP:98 % Physical Exam  Musculoskeletal:  Patient is slow to rise from a seated position. She is obese. She ambulates without aid with an antalgic gait. He has good distal strength.    Ortho Exam Imaging: No results found.  Past Medical/Family/Surgical/Social History: Medications & Allergies reviewed per EMR Patient Active Problem List   Diagnosis Date Noted  . Acute back pain with sciatica, right 01/14/2016  . Right hip pain 01/14/2016  . Vertigo 04/14/2015  . Anxiety state 01/22/2015  . HLD  (hyperlipidemia) 01/22/2015  . Type 2 diabetes mellitus with circulatory disorder ([place]) 01/22/2015  . Intracranial vascular stenosis 01/22/2015  . Aneurysm ([place])   . Headache   . Transient cerebral ischemia   . Diabetes mellitus with complication ([place]) 0000000  . Essential hypertension 11/12/2014  . Dependent edema 11/12/2014  . Peripheral neuropathy ([place]) 11/12/2014  . Depression 11/12/2014  . Facial droop   . Chest pain   . TIA (transient ischemic attack) 11/11/2014  . Dyspnea 08/06/2014  . Hyperthyroidism 08/06/2014  . Meniere's disease 06/07/2014  . History of CHF (congestive heart failure) 06/07/2014  . Essential hypertension, benign 06/13/2013  . Type II or unspecified type diabetes mellitus without mention of complication, not stated as uncontrolled 06/13/2013  . Goiter 06/13/2013  . Obesity, unspecified 06/13/2013   Past Medical History:  Diagnosis Date  . Cerebral aneurysm   . Complication of anesthesia HYPOTENSION INTRAOP W/ HYSTERECTOMY IN 1987--  DID OK W/ TOTAL KNEE IN 2008  . Diabetes mellitus   . DJD (degenerative joint disease)   . Dyslipidemia   . GERD (gastroesophageal reflux disease)   . H/O hiatal hernia   . History of CHF (congestive heart failure) 2010-  RESOLVED  . History of peptic ulcer YRS AGO  .  Hypertension   . Meniere's disease   . Mitral valve prolapse occasional palpitations w/ chest discomfort  . OA (osteoarthritis)   . Rotator cuff tear, left   . Stroke [facility])    Family History  Problem Relation Age of Onset  . Heart disease Mother     in her 43s  . Diabetes Mother   . Kidney disease Mother   . Thyroid disease Mother   . Aneurysm Mother   . Ovarian cancer Mother   . Breast cancer Mother   . Thyroid disease Sister   . Heart disease Sister   . Lymphoma Father   . Thyroid disease Brother   . Thyroid disease Paternal Grandmother   . Heart attack Sister     at age 20  . Breast cancer Maternal Aunt   . Breast cancer Maternal  Grandmother   . Breast cancer Maternal Aunt   . Breast cancer Maternal Aunt    Past Surgical History:  Procedure Laterality Date  . BUNIONECTOMY  2007   LEFT FOOT  . LEFT SHOULDER SURGERY  1997   ROTATOR CUFF REPAIR  . SHOULDER ARTHROSCOPY  09/20/2011   Procedure: ARTHROSCOPY SHOULDER;  Surgeon: Johnn Hai, MD;  Location: [facility];  Service: Orthopedics;  Laterality: Left;  SAD AND MINI OPEN ROTATOR CUFF REPAIR    . TOTAL KNEE ARTHROPLASTY  09-12-2006  [facility]   RIGHT KNEE  . TUBAL LIGATION  1976  . TYMPANOPLASTY  1990;   1980  . VAGINAL HYSTERECTOMY  1987   Social History   Occupational History  . Not on file.   Social History Main Topics  . Smoking status: Never Smoker  . Smokeless tobacco: Never Used  . Alcohol use No  . Drug use: No  . Sexual activity: Not on file

## 2016-03-03 ENCOUNTER — Other Ambulatory Visit: Payer: Self-pay

## 2016-03-05 ENCOUNTER — Encounter: Payer: Self-pay | Admitting: Gastroenterology

## 2016-03-05 ENCOUNTER — Ambulatory Visit (AMBULATORY_SURGERY_CENTER): Payer: Medicare Other | Admitting: Gastroenterology

## 2016-03-05 VITALS — BP 131/62 | HR 63 | Temp 97.1°F | Resp 20 | Ht 62.7 in | Wt 190.0 lb

## 2016-03-05 DIAGNOSIS — Z1211 Encounter for screening for malignant neoplasm of colon: Secondary | ICD-10-CM | POA: Diagnosis present

## 2016-03-05 DIAGNOSIS — Z1212 Encounter for screening for malignant neoplasm of rectum: Secondary | ICD-10-CM

## 2016-03-05 DIAGNOSIS — D129 Benign neoplasm of anus and anal canal: Secondary | ICD-10-CM

## 2016-03-05 DIAGNOSIS — K621 Rectal polyp: Secondary | ICD-10-CM

## 2016-03-05 DIAGNOSIS — D128 Benign neoplasm of rectum: Secondary | ICD-10-CM

## 2016-03-05 LAB — GLUCOSE, CAPILLARY
Glucose-Capillary: 110 mg/dL — ABNORMAL HIGH (ref 65–99)
Glucose-Capillary: 98 mg/dL (ref 65–99)

## 2016-03-05 MED ORDER — SODIUM CHLORIDE 0.9 % IV SOLN: 500.0000 mL | INTRAVENOUS | Status: DC

## 2016-03-05 NOTE — Progress Notes (Signed)
A/ox3 pleased with MAC, report to Jane RN 

## 2016-03-05 NOTE — Patient Instructions (Addendum)
Impression/Recommendations:  Polyp handout given to patient. Hemorrhoid handout given to patient.  Repeat colonoscopy in 5-10 years for surveillance based on pathology results.  Resume Plavix (clopidogrel) at prior dose tomorrow.  YOU HAD AN ENDOSCOPIC PROCEDURE TODAY AT [facility]:   Refer to the procedure report that was given to you for any specific questions about what was found during the examination.  If the procedure report does not answer your questions, please call your gastroenterologist to clarify.  If you requested that your care partner not be given the details of your procedure findings, then the procedure report has been included in a sealed envelope for you to review at your convenience later.  YOU SHOULD EXPECT: Some feelings of bloating in the abdomen. Passage of more gas than usual.  Walking can help get rid of the air that was put into your GI tract during the procedure and reduce the bloating. If you had a lower endoscopy (such as a colonoscopy or flexible sigmoidoscopy) you may notice spotting of blood in your stool or on the toilet paper. If you underwent a bowel prep for your procedure, you may not have a normal bowel movement for a few days.  Please Note:  You might notice some irritation and congestion in your nose or some drainage.  This is from the oxygen used during your procedure.  There is no need for concern and it should clear up in a day or so.  SYMPTOMS TO REPORT IMMEDIATELY:   Following lower endoscopy (colonoscopy or flexible sigmoidoscopy):  Excessive amounts of blood in the stool  Significant tenderness or worsening of abdominal pains  Swelling of the abdomen that is new, acute  Fever of 100F or higher  For urgent or emergent issues, a gastroenterologist can be reached at any hour by calling [phone].   DIET:  We do recommend a small meal at first, but then you may proceed to your regular diet.  Drink plenty of fluids but you  should avoid alcoholic beverages for 24 hours.  ACTIVITY:  You should plan to take it easy for the rest of today and you should NOT DRIVE or use heavy machinery until tomorrow (because of the sedation medicines used during the test).    FOLLOW UP: Our staff will call the number listed on your records the next business day following your procedure to check on you and address any questions or concerns that you may have regarding the information given to you following your procedure. If we do not reach you, we will leave a message.  However, if you are feeling well and you are not experiencing any problems, there is no need to return our call.  We will assume that you have returned to your regular daily activities without incident.  If any biopsies were taken you will be contacted by phone or by letter within the next 1-3 weeks.  Please call us at [phone] if you have not heard about the biopsies in 3 weeks.    SIGNATURES/CONFIDENTIALITY: You and/or your care partner have signed paperwork which will be entered into your electronic medical record.  These signatures attest to the fact that that the information above on your After Visit Summary has been reviewed and is understood.  Full responsibility of the confidentiality of this discharge information lies with you and/or your care-partner.

## 2016-03-05 NOTE — Progress Notes (Signed)
Iv f changed to d5w the patient says she has eaten a piece of hard candy this am or blood sugar would not even be that high and that she thinks she needs some sugar water iv because normally she gets jittery at blood sugar of 29, so she is afraid blood sugar will drop bu the time she gets to recovery room. 500 cc d5w started at [facility]. Pt's recovery room nurse and procedure CRNA notified.

## 2016-03-05 NOTE — Progress Notes (Signed)
[id number] Informed by RN that D5W IV was initiated for BS of 110. 1326 Upon arrival to exam room D5W d/cd. 1329 After interview and timeout Propofol 100 mg administrered

## 2016-03-05 NOTE — Op Note (Signed)
[facility] Patient Name: Sherry [name] Procedure Date: 03/05/2016 1:04 PM MRN: [id number] Endoscopist: Mauri Pole , MD Age: 66 Referring MD:  Date of Birth: 19-Jul-1950 Gender: Female Account #: [id number] Procedure:                Colonoscopy Indications:              Screening for malignant neoplasm in the rectum,                            Last colonoscopy: date unknown (unable to locate                            last colonoscopy report) Medicines:                Monitored Anesthesia Care Procedure:                Pre-Anesthesia Assessment:                           - Prior to the procedure, a History and Physical                            was performed, and patient medications and                            allergies were reviewed. The patient's tolerance of                            previous anesthesia was also reviewed. The risks                            and benefits of the procedure and the sedation                            options and risks were discussed with the patient.                            All questions were answered, and informed consent                            was obtained. Prior Anticoagulants: The patient                            last took Plavix (clopidogrel) 7 days prior to the                            procedure. ASA Grade Assessment: III - A patient                            with severe systemic disease. After reviewing the                            risks and benefits, the patient was deemed in  satisfactory condition to undergo the procedure.                           After obtaining informed consent, the colonoscope                            was passed under direct vision. Throughout the                            procedure, the patient's blood pressure, pulse, and                            oxygen saturations were monitored continuously. The                            Model CF-HQ190L  [id number]) scope was introduced                            through the anus and advanced to the the terminal                            ileum, with identification of the appendiceal                            orifice and IC valve. The colonoscopy was performed                            without difficulty. The patient tolerated the                            procedure well. The quality of the bowel                            preparation was excellent. The terminal ileum,                            ileocecal valve, appendiceal orifice, and rectum                            were photographed. Scope In: 1:29:54 PM Scope Out: 1:51:23 PM Scope Withdrawal Time: 0 hours 11 minutes 44 seconds  Total Procedure Duration: 0 hours 21 minutes 29 seconds  Findings:                 The perianal and digital rectal examinations were                            normal.                           Two sessile hyperplastic appearing polyps were                            found in the rectum. The polyps were 1 to 2 mm in  size. These polyps were removed with a cold biopsy                            forceps. Resection and retrieval were complete.                           Non-bleeding internal hemorrhoids were found during                            retroflexion. The hemorrhoids were small.                           The exam was otherwise without abnormality. Complications:            No immediate complications. Estimated Blood Loss:     Estimated blood loss: none. Impression:               - Two 1 to 2 mm polyps in the rectum, removed with                            a cold biopsy forceps. Resected and retrieved.                           - Non-bleeding internal hemorrhoids.                           - The examination was otherwise normal. Recommendation:           - Patient has a contact number available for                            emergencies. The signs and symptoms of potential                             delayed complications were discussed with the                            patient. Return to normal activities tomorrow.                            Written discharge instructions were provided to the                            patient.                           - Resume previous diet.                           - Continue present medications.                           - Await pathology results.                           - Repeat colonoscopy in 5-10 years for surveillance  based on pathology results.                           - Resume Plavix (clopidogrel) at prior dose                            tomorrow. Refer to managing physician for further                            adjustment of therapy. Mauri Pole, MD 03/05/2016 1:55:42 PM This report has been signed electronically.

## 2016-03-05 NOTE — Progress Notes (Signed)
Called to room to assist during endoscopic procedure.  Patient ID and intended procedure confirmed with present staff. Received instructions for my participation in the procedure from the performing physician.  

## 2016-03-08 ENCOUNTER — Telehealth: Payer: Self-pay

## 2016-03-08 NOTE — Telephone Encounter (Signed)
  Follow up Call-  Call back number 03/05/2016  Post procedure Call Back phone  # [phone]  Permission to leave phone message Yes  Some recent data might be hidden     Patient questions:  Do you have a fever, pain , or abdominal swelling? No. Pain Score  0 *  Have you tolerated food without any problems? Yes.    Have you been able to return to your normal activities? Yes.    Do you have any questions about your discharge instructions: Diet   No. Medications  No. Follow up visit  No.  Do you have questions or concerns about your Care? No.  Actions: * If pain score is 4 or above: No action needed, pain <4.

## 2016-03-15 ENCOUNTER — Encounter: Payer: Self-pay | Admitting: Gastroenterology

## 2016-03-18 ENCOUNTER — Ambulatory Visit (INDEPENDENT_AMBULATORY_CARE_PROVIDER_SITE_OTHER): Payer: Medicare Other | Admitting: Family Medicine

## 2016-03-18 VITALS — BP 109/62 | HR 69 | Temp 98.0°F | Ht 62.5 in | Wt 189.8 lb

## 2016-03-18 DIAGNOSIS — I10 Essential (primary) hypertension: Secondary | ICD-10-CM

## 2016-03-18 DIAGNOSIS — F4320 Adjustment disorder, unspecified: Secondary | ICD-10-CM

## 2016-03-18 DIAGNOSIS — Z0000 Encounter for general adult medical examination without abnormal findings: Secondary | ICD-10-CM

## 2016-03-18 DIAGNOSIS — E118 Type 2 diabetes mellitus with unspecified complications: Secondary | ICD-10-CM | POA: Diagnosis not present

## 2016-03-18 DIAGNOSIS — M79672 Pain in left foot: Secondary | ICD-10-CM

## 2016-03-18 DIAGNOSIS — M79671 Pain in right foot: Secondary | ICD-10-CM

## 2016-03-18 DIAGNOSIS — E2839 Other primary ovarian failure: Secondary | ICD-10-CM

## 2016-03-18 DIAGNOSIS — F4321 Adjustment disorder with depressed mood: Secondary | ICD-10-CM

## 2016-03-18 LAB — LIPID PANEL
Cholesterol: 154 mg/dL (ref 0–200)
HDL: 64.2 mg/dL (ref >39.00)
LDL Cholesterol: 74 mg/dL (ref 0–99)
NonHDL: 89.68
Total CHOL/HDL Ratio: 2
Triglycerides: 78 mg/dL (ref 0.0–149.0)
VLDL: 15.6 mg/dL (ref 0.0–40.0)

## 2016-03-18 MED ORDER — GABAPENTIN 300 MG PO CAPS: 600.0000 mg | ORAL_CAPSULE | Freq: Two times a day (BID) | ORAL | 1 refills | Status: DC

## 2016-03-18 MED ORDER — FUROSEMIDE 20 MG PO TABS: ORAL_TABLET | ORAL | 3 refills | Status: DC

## 2016-03-18 MED ORDER — LISINOPRIL 20 MG PO TABS: 20.0000 mg | ORAL_TABLET | Freq: Every day | ORAL | 3 refills | Status: DC

## 2016-03-18 MED ORDER — METOPROLOL TARTRATE 25 MG PO TABS: 25.0000 mg | ORAL_TABLET | Freq: Two times a day (BID) | ORAL | 3 refills | Status: DC

## 2016-03-18 MED FILL — FUROSEMIDE 20 MG TABLET: 20 | 90 days supply | Qty: 90 | Fill #0

## 2016-03-18 MED FILL — METOPROLOL TARTRATE 25 MG T: 25 | 90 days supply | Qty: 180 | Fill #0

## 2016-03-18 MED FILL — GABAPENTIN 300 MG CAPSULE: 300 | 90 days supply | Qty: 360 | Fill #0

## 2016-03-18 MED FILL — LISINOPRIL 20 MG TABLET: 20 | 90 days supply | Qty: 90 | Fill #0

## 2016-03-18 NOTE — Patient Instructions (Addendum)
It was very nice to see you today- I will be in touch with your lab results asap  We will set you up to see a foot doctor, and I will also arrange for you to have a bone density scan to make sure that your bones are strong Please increase your zoloft to 50 mg but let me know if your mood is not getting better soon

## 2016-03-18 NOTE — Progress Notes (Signed)
[place] at [facility] [address] [place], [place] [address] [id number] [phone]  Date:  03/18/2016   Name:  Sherry [name]   DOB:  July 26, 1950   MRN:  [id number]  PCP:  [name], MD    Chief Complaint: Annual Exam (Pt here for CPE. Pt states that it's been a while since PAP and would like to know if she will need a PAP. )   History of Present Illness:  Sherry [name] is a 66 y.o. very pleasant female patient who presents with the following:  Here for CPE today.  Hsitory of: Anxiety/Depression: on Ativan, Zoloft, Seroquel Hypertension, CHF: on Lisinopril, Metoprolol, Furosemide Diabetes: on Metformin, Jardiance, Tradjenta Hyperlipidemia:  Not on medication Anticoagulation with plavix due to history of TIA Peripheral neuropathy/ foot drop: on Neurontin She notes that "my toes are killing me," some of her toes are starting to cross over and forming corns.  Also she is tender on the soles of her feet- she thinks due to poor sole padding and thin soles  She does not have a podiatrist but would like to get one  She notes that her mood is not that good.  When she is with her great grand-son she feels better- he is 41 yo and really cheers her up.  She is also caring for her own parents who are elderly.   She is still very much missing her husband who died last 2022-08-05. She is on zoloft 25 at this time- she would be willing to consider going up to 50 mg to help improve her mood.  She denies any suicidal ideation.   Her grand-son has been in jail but he is getting out today and will be living with her again.  She hopes that this will help to boost her mood.    She notes that she sleeps well at night. Her back is not bothering her as much- she had an epidural injection 2 weeks ago that really helped her.   Followed by Ortho, Dr. Marlou Sa, for back pain. Followed by Endo, Dr. Loanne Drilling, for DM  (A1C on 12-18-2015 = 6.8) Her glucose this am was  159 She is not fasting today  Last seen in this office on 12-18-2015, to establish care in this office (although she was previously seen at [facility]).  HPI from this last visit:  She is seeing a therapist for her depression. Her husband passed away in 08/05/2022 of this year-  She states that he went into a fib at home, she called 911 but traffic was heavy so she drove him to [facility]  He went into cardiac arrest and died.  They had been married for 8 years, were together for about 40 years total however. She does not really feel like therapy is helping her; she relates having a hard time accepting help as she is used to being the one helping.  She does not really feel like she is getting better from her grief at his death yet.   She is using zoloft and seroquel She is alone in her home right now, she is taking care of her 66 yo great grand-son a lot of the time which keeps her busy.  She denies SI, and is still finding joy in life  They are watching her intracranial aneurysm right now- the plan to continue monitoring this annually and only operate if it enlarges.    She has noted right hip pain for several months-  she does not use NSAIDS as they give her palpitations- she will use vicodin as needed for more severe pain.  She has had a couple of falls recently.  She has a neurologist and plans to see Dr. Erlinda Hong soon for follow-up and will be sure to tell him about her falls.  In the meantime she is using a cane as needed   She is s/p hysterectomy for fibroid- no GYN cancer dexa scan: needs, will schedule Lat labs in November- CMP and CBC She is due for lipids Lab Results  Component Value Date   HGBA1C 6.8 (H) 12/18/2015   She is not aware of having had a pneumonia vaccine.  However she had anaphylaxis to the flu vaccine, is NOT allergic to eggs.  I have asked pharmD to look into this for me and see if she can safely get pneumonia vaccine   BP Readings from Last 3 Encounters:  03/18/16 109/62  03/05/16 131/62   03/01/16 (!) 142/74   BP is a bit low today but normally she is in normal range on her current regimen   Patient Active Problem List   Diagnosis Date Noted  . Acute back pain with sciatica, right 01/14/2016  . Right hip pain 01/14/2016  . Vertigo 04/14/2015  . Anxiety state 01/22/2015  . HLD (hyperlipidemia) 01/22/2015  . Type 2 diabetes mellitus with circulatory disorder ([place]) 01/22/2015  . Intracranial vascular stenosis 01/22/2015  . Aneurysm ([place])   . Headache   . Transient cerebral ischemia   . Diabetes mellitus with complication ([place]) 0000000  . Essential hypertension 11/12/2014  . Dependent edema 11/12/2014  . Peripheral neuropathy ([place]) 11/12/2014  . Depression 11/12/2014  . Facial droop   . Chest pain   . TIA (transient ischemic attack) 11/11/2014  . Dyspnea 08/06/2014  . Hyperthyroidism 08/06/2014  . Meniere's disease 06/07/2014  . History of CHF (congestive heart failure) 06/07/2014  . Essential hypertension, benign 06/13/2013  . Type II or unspecified type diabetes mellitus without mention of complication, not stated as uncontrolled 06/13/2013  . Goiter 06/13/2013  . Obesity, unspecified 06/13/2013    Past Medical History:  Diagnosis Date  . Cerebral aneurysm   . Complication of anesthesia HYPOTENSION INTRAOP W/ HYSTERECTOMY IN 1987--  DID OK W/ TOTAL KNEE IN 2008  . Diabetes mellitus   . DJD (degenerative joint disease)   . Dyslipidemia   . GERD (gastroesophageal reflux disease)   . H/O hiatal hernia   . History of CHF (congestive heart failure) 2010-  RESOLVED  . History of peptic ulcer YRS AGO  . Hypertension   . Meniere's disease   . Mitral valve prolapse occasional palpitations w/ chest discomfort  . OA (osteoarthritis)   . Rotator cuff tear, left   . Stroke [facility])     Past Surgical History:  Procedure Laterality Date  . BUNIONECTOMY  2007   LEFT FOOT  . LEFT SHOULDER SURGERY  1997   ROTATOR CUFF REPAIR  . SHOULDER ARTHROSCOPY  09/20/2011    Procedure: ARTHROSCOPY SHOULDER;  Surgeon: Johnn Hai, MD;  Location: [facility];  Service: Orthopedics;  Laterality: Left;  SAD AND MINI OPEN ROTATOR CUFF REPAIR    . TOTAL KNEE ARTHROPLASTY  09-12-2006  [facility]   RIGHT KNEE  . TUBAL LIGATION  1976  . TYMPANOPLASTY  1990;   1980  . VAGINAL HYSTERECTOMY  1987    Social History  Substance Use Topics  . Smoking status: Never Smoker  . Smokeless tobacco:  Never Used  . Alcohol use No    Family History  Problem Relation Age of Onset  . Heart disease Mother     in her 34s  . Diabetes Mother   . Kidney disease Mother   . Thyroid disease Mother   . Aneurysm Mother   . Ovarian cancer Mother   . Breast cancer Mother   . Thyroid disease Sister   . Heart disease Sister   . Lymphoma Father   . Thyroid disease Brother   . Thyroid disease Paternal Grandmother   . Heart attack Sister     at age 34  . Breast cancer Maternal Aunt   . Breast cancer Maternal Grandmother   . Breast cancer Maternal Aunt   . Breast cancer Maternal Aunt     Allergies  Allergen Reactions  . Influenza Vaccines Anaphylaxis  . Codeine Itching  . Demerol Other (See Comments)    SEIZURE  . Nsaids Other (See Comments)    NAPROXEN, IBUPROFEN, SKELAXIN---  CAUSES PALPITATIONS  . Prednisone     Caused her glucose to go up to 500 and went to ED  . Tramadol     Contraindicated with Victoza     Medication list has been reviewed and updated.  Current Outpatient Prescriptions on File Prior to Visit  Medication Sig Dispense Refill  . albuterol (PROVENTIL HFA;VENTOLIN HFA) 108 (90 BASE) MCG/ACT inhaler Inhale 2 puffs into the lungs every 6 (six) hours as needed. For shortness of breath.    Mar[place] Kitchen albuterol (PROVENTIL) (2.5 MG/3ML) 0.083% nebulizer solution Take 3 mLs (2.5 mg total) by nebulization every 6 (six) hours as needed for wheezing or shortness of breath. 150 mL 1  . aspirin EC 325 MG tablet Take 325 mg by mouth daily.    . cetirizine  (ZYRTEC) 10 MG tablet Take 1 tablet (10 mg total) by mouth daily. 90 tablet 3  . clopidogrel (PLAVIX) 75 MG tablet Take 1 tablet (75 mg total) by mouth daily. 90 tablet 3  . docusate sodium (COLACE) 100 MG capsule Take 100 mg by mouth daily.    . empagliflozin (JARDIANCE) 10 MG TABS tablet Take 5 mg by mouth daily. 45 tablet 3  . furosemide (LASIX) 20 MG tablet Take 1 tab daily. 30 tablet 2  . gabapentin (NEURONTIN) 300 MG capsule Take 2 capsules (600 mg total) by mouth 2 (two) times daily. 180 capsule 1  . HYDROcodone-acetaminophen (NORCO/VICODIN) 5-325 MG tablet Take 1 tablet by mouth every 4 (four) hours as needed. 10 tablet 0  . ipratropium (ATROVENT) 0.03 % nasal spray Place 2 sprays into the nose 4 (four) times daily. (Patient taking differently: Place 2 sprays into the nose every morning. ) 30 mL 6  . linagliptin (TRADJENTA) 5 MG TABS tablet Take 0.5 tablets (2.5 mg total) by mouth daily. 45 tablet 3  . lisinopril (PRINIVIL,ZESTRIL) 20 MG tablet Take 1 tablet (20 mg total) by mouth daily. 30 tablet 2  . LORazepam (ATIVAN) 1 MG tablet Take 1 tablet (1 mg total) by mouth 3 (three) times daily as needed for anxiety. (Patient not taking: Reported on 03/05/2016) 15 tablet 0  . metFORMIN (GLUCOPHAGE) 500 MG tablet Take 2 tablets (1,000 mg total) by mouth 2 (two) times daily. TAKE 2 TABLETS(1000 MG) BY MOUTH TWICE DAILY WITH A MEAL 360 tablet 3  . metoprolol tartrate (LOPRESSOR) 25 MG tablet Take 1 tablet (25 mg total) by mouth 2 (two) times daily. 60 tablet 5  . mometasone (ASMANEX) 220 MCG/INH  inhaler Use 1 puff daily followed by rinsing your mouth out with water. (Patient not taking: Reported on 03/05/2016) 1 Inhaler 12  . omeprazole (PRILOSEC) 20 MG capsule Take 1 capsule (20 mg total) by mouth daily. 30 capsule 12  . QUEtiapine (SEROQUEL) 100 MG tablet Take 0.5 tablets (50 mg total) by mouth at bedtime. 45 tablet 3  . sertraline (ZOLOFT) 50 MG tablet Take 0.5 tablets (25 mg total) by mouth at  bedtime. 45 tablet 3   Current Facility-Administered Medications on File Prior to Visit  Medication Dose Route Frequency Provider Last Rate Last Dose  . 0.9 %  sodium chloride infusion  500 mL Intravenous Continuous Kavitha Nandigam V, MD      . lidocaine (PF) (XYLOCAINE) 1 % injection 0.3 mL  0.3 mL Other Once Magnus Sinning, MD        Review of Systems:  As per HPI- otherwise negative.   Physical Examination: Vitals:   03/18/16 1101  BP: 109/62  Pulse: 69  Temp: 98 F (36.7 C)   Vitals:   03/18/16 1101  Weight: 189 lb 12.8 oz (86.1 kg)  Height: 5' 2.5" (1.588 m)   Body mass index is 34.16 kg/m. Ideal Body Weight: Weight in (lb) to have BMI = 25: 138.6  GEN: WDWN, NAD, Non-toxic, A & O x 3, obese, looks well HEENT: Atraumatic, Normocephalic. Neck supple. No masses, No LAD. Bilateral TM wnl, oropharynx normal.  PEERL,EOMI.   Ears and Nose: No external deformity. CV: RRR, No M/G/R. No JVD. No thrill. No extra heart sounds. PULM: CTA B, no wheezes, crackles, rhonchi. No retractions. No resp. distress. No accessory muscle use. ABD: S, NT, ND EXTR: No c/c/e NEURO Normal gait.  PSYCH: Normally interactive. Conversant. Not depressed or anxious appearing.  Calm demeanor.  Feet: she has thin soles with tenderness over MT heads bilaterally Her left 2nd toe is starting to cross over the big toe and she has corns and calluses on both feet    Assessment and Plan: Physical exam  Type 2 diabetes mellitus with complication, without long-term current use of insulin (HCC) - Plan: Hemoglobin A1c, Lipid panel, gabapentin (NEURONTIN) 300 MG capsule  Essential hypertension - Plan: lisinopril (PRINIVIL,ZESTRIL) 20 MG tablet, furosemide (LASIX) 20 MG tablet, metoprolol tartrate (LOPRESSOR) 25 MG tablet  Pain in both feet - Plan: Ambulatory referral to Podiatry  Estrogen deficiency - Plan: DG Bone Density  Grief reaction   Here today for a CPE A1c., lipids today Refilled her BP  medications today She has been feeling down and lonely since her husband died last year- she is hopeful that when her grand-son comes to live with her again she will feel better.  She will also increase her zoloft to 50 mg but will let me know if her mood is not improving or if she is getting worse  Referral to podiatry Bone density ordered   Will plan further follow- up pending labs.   Signed [name], MD

## 2016-03-19 ENCOUNTER — Encounter: Payer: Self-pay | Admitting: Family Medicine

## 2016-03-19 ENCOUNTER — Encounter (INDEPENDENT_AMBULATORY_CARE_PROVIDER_SITE_OTHER): Payer: Self-pay | Admitting: Physical Medicine and Rehabilitation

## 2016-03-19 LAB — HEMOGLOBIN A1C
Hgb A1c MFr Bld: 6.3 % — ABNORMAL HIGH (ref <5.7)
Mean Plasma Glucose: 134 mg/dL

## 2016-03-22 ENCOUNTER — Ambulatory Visit (HOSPITAL_BASED_OUTPATIENT_CLINIC_OR_DEPARTMENT_OTHER)
Admission: RE | Admit: 2016-03-22 | Discharge: 2016-03-22 | Disposition: A | Discharge status: Home / Self Care | Payer: Medicare Other | Source: Ambulatory Visit | Attending: Family Medicine | Admitting: Family Medicine

## 2016-03-22 DIAGNOSIS — E2839 Other primary ovarian failure: Secondary | ICD-10-CM | POA: Diagnosis present

## 2016-03-22 DIAGNOSIS — Z780 Asymptomatic menopausal state: Secondary | ICD-10-CM | POA: Diagnosis not present

## 2016-03-22 NOTE — Telephone Encounter (Signed)
Follow up OV me Or Marlou Sa

## 2016-03-23 ENCOUNTER — Emergency Department (HOSPITAL_COMMUNITY): Payer: Medicare Other

## 2016-03-23 ENCOUNTER — Emergency Department (HOSPITAL_COMMUNITY)
Admission: EM | Admit: 2016-03-23 | Discharge: 2016-03-23 | Disposition: A | Discharge status: Home / Self Care | Payer: Medicare Other | Attending: Emergency Medicine | Admitting: Emergency Medicine

## 2016-03-23 ENCOUNTER — Ambulatory Visit (INDEPENDENT_AMBULATORY_CARE_PROVIDER_SITE_OTHER): Payer: Medicare Other | Admitting: Podiatry

## 2016-03-23 ENCOUNTER — Ambulatory Visit (INDEPENDENT_AMBULATORY_CARE_PROVIDER_SITE_OTHER): Payer: Medicare Other | Admitting: Orthopedic Surgery

## 2016-03-23 ENCOUNTER — Encounter: Payer: Self-pay | Admitting: Podiatry

## 2016-03-23 ENCOUNTER — Encounter (INDEPENDENT_AMBULATORY_CARE_PROVIDER_SITE_OTHER): Payer: Self-pay | Admitting: Orthopedic Surgery

## 2016-03-23 ENCOUNTER — Encounter (HOSPITAL_COMMUNITY): Payer: Self-pay

## 2016-03-23 ENCOUNTER — Ambulatory Visit (INDEPENDENT_AMBULATORY_CARE_PROVIDER_SITE_OTHER): Payer: Medicare Other

## 2016-03-23 DIAGNOSIS — S3992XA Unspecified injury of lower back, initial encounter: Secondary | ICD-10-CM | POA: Diagnosis not present

## 2016-03-23 DIAGNOSIS — I509 Heart failure, unspecified: Secondary | ICD-10-CM | POA: Insufficient documentation

## 2016-03-23 DIAGNOSIS — S199XXA Unspecified injury of neck, initial encounter: Secondary | ICD-10-CM | POA: Diagnosis not present

## 2016-03-23 DIAGNOSIS — Z7982 Long term (current) use of aspirin: Secondary | ICD-10-CM | POA: Diagnosis not present

## 2016-03-23 DIAGNOSIS — E119 Type 2 diabetes mellitus without complications: Secondary | ICD-10-CM

## 2016-03-23 DIAGNOSIS — E1159 Type 2 diabetes mellitus with other circulatory complications: Secondary | ICD-10-CM | POA: Insufficient documentation

## 2016-03-23 DIAGNOSIS — M2010 Hallux valgus (acquired), unspecified foot: Secondary | ICD-10-CM | POA: Diagnosis not present

## 2016-03-23 DIAGNOSIS — Y939 Activity, unspecified: Secondary | ICD-10-CM | POA: Insufficient documentation

## 2016-03-23 DIAGNOSIS — M542 Cervicalgia: Secondary | ICD-10-CM | POA: Insufficient documentation

## 2016-03-23 DIAGNOSIS — Q828 Other specified congenital malformations of skin: Secondary | ICD-10-CM

## 2016-03-23 DIAGNOSIS — M546 Pain in thoracic spine: Secondary | ICD-10-CM

## 2016-03-23 DIAGNOSIS — Y929 Unspecified place or not applicable: Secondary | ICD-10-CM | POA: Insufficient documentation

## 2016-03-23 DIAGNOSIS — L84 Corns and callosities: Secondary | ICD-10-CM

## 2016-03-23 DIAGNOSIS — M2041 Other hammer toe(s) (acquired), right foot: Secondary | ICD-10-CM

## 2016-03-23 DIAGNOSIS — S0990XA Unspecified injury of head, initial encounter: Secondary | ICD-10-CM | POA: Insufficient documentation

## 2016-03-23 DIAGNOSIS — Z96651 Presence of right artificial knee joint: Secondary | ICD-10-CM | POA: Insufficient documentation

## 2016-03-23 DIAGNOSIS — S299XXA Unspecified injury of thorax, initial encounter: Secondary | ICD-10-CM | POA: Diagnosis not present

## 2016-03-23 DIAGNOSIS — I110 Hypertensive heart disease with heart failure: Secondary | ICD-10-CM | POA: Insufficient documentation

## 2016-03-23 DIAGNOSIS — Z7984 Long term (current) use of oral hypoglycemic drugs: Secondary | ICD-10-CM | POA: Insufficient documentation

## 2016-03-23 DIAGNOSIS — M2042 Other hammer toe(s) (acquired), left foot: Secondary | ICD-10-CM | POA: Diagnosis not present

## 2016-03-23 DIAGNOSIS — G44209 Tension-type headache, unspecified, not intractable: Secondary | ICD-10-CM | POA: Diagnosis not present

## 2016-03-23 DIAGNOSIS — Z8673 Personal history of transient ischemic attack (TIA), and cerebral infarction without residual deficits: Secondary | ICD-10-CM | POA: Diagnosis not present

## 2016-03-23 DIAGNOSIS — R51 Headache: Secondary | ICD-10-CM | POA: Diagnosis not present

## 2016-03-23 DIAGNOSIS — E079 Disorder of thyroid, unspecified: Secondary | ICD-10-CM | POA: Insufficient documentation

## 2016-03-23 DIAGNOSIS — Y999 Unspecified external cause status: Secondary | ICD-10-CM | POA: Insufficient documentation

## 2016-03-23 DIAGNOSIS — W228XXA Striking against or struck by other objects, initial encounter: Secondary | ICD-10-CM | POA: Diagnosis not present

## 2016-03-23 DIAGNOSIS — R42 Dizziness and giddiness: Secondary | ICD-10-CM | POA: Diagnosis not present

## 2016-03-23 DIAGNOSIS — M545 Low back pain: Secondary | ICD-10-CM | POA: Diagnosis not present

## 2016-03-23 LAB — CBC
HCT: 43.5 % (ref 36.0–46.0)
Hemoglobin: 13.7 g/dL (ref 12.0–15.0)
MCH: 28.8 pg (ref 26.0–34.0)
MCHC: 31.5 g/dL (ref 30.0–36.0)
MCV: 91.6 fL (ref 78.0–100.0)
Platelets: 245 10*3/uL (ref 150–400)
RBC: 4.75 MIL/uL (ref 3.87–5.11)
RDW: 13.5 % (ref 11.5–15.5)
WBC: 8 10*3/uL (ref 4.0–10.5)

## 2016-03-23 LAB — BASIC METABOLIC PANEL
Anion gap: 9 (ref 5–15)
BUN: 12 mg/dL (ref 6–20)
CO2: 28 mmol/L (ref 22–32)
Calcium: 9.1 mg/dL (ref 8.9–10.3)
Chloride: 107 mmol/L (ref 101–111)
Creatinine, Ser: 0.66 mg/dL (ref 0.44–1.00)
GFR calc Af Amer: >60 mL/min (ref >60)
GFR calc non Af Amer: >60 mL/min (ref >60)
Glucose, Bld: 112 mg/dL — ABNORMAL HIGH (ref 65–99)
Potassium: 3.8 mmol/L (ref 3.5–5.1)
Sodium: 144 mmol/L (ref 135–145)

## 2016-03-23 NOTE — Discharge Instructions (Signed)
Take Tylenol as needed for pain. Follow-up with your primary doctor as needed if not feeling better in the next week

## 2016-03-23 NOTE — Progress Notes (Signed)
Office Visit Note   Patient: Sherry [name]           Date of Birth: 07-04-1950           MRN: [id number] Visit Date: 03/23/2016 Requested by: Sherry Mclean, MD [address] [place],  [id number] PCP: Sherry Blinks, MD  Subjective: Chief Complaint  Patient presents with  . Neck - Pain  . Middle Back - Pain    HPI Sherry [name] is a 66 year old patient with upper back and neck pain.  She fell yesterday off the porch onto [address].  This was more of a 3 step rotational fall where she was rotating down but she did hit her head.  There was no loss of consciousness but she felt somewhat dizzy when she got up.  She describes now pain in the left arm and left scapular area and she states that her left arm feels heavy.  She did have some numbness around her lips this morning.  Currently the patient states that she "feels funny" she's having a hard time focusing.  She states that she feels wobbly.  She's not taking any medication for the problem yet.  She did drive over here herself.              Review of Systems All systems reviewed are negative as they relate to the chief complaint within the history of present illness.  Patient denies  fevers or chills.    Assessment & Plan: Visit Diagnoses:  1. Cervicalgia   2. Pain in thoracic spine     Plan: Impression is likely whiplash-type injury affecting the cervical spine and upper thoracic spine but my main concern is for possible head injury she's having some signs and symptoms consistent with concussion.  I will detect any focal weakness in her upper extremities.  I recommended that she go to the emergency room via ambulance for CT of her head.  Did talk with the emergency room physician who is there.  The patient is on full aspirin and Plavix and thus her risk of having a potential head bleed is reasonably high that I think she needs imaging.  Follow-Up Instructions: No Follow-up on file.   Orders:  Orders Placed  This Encounter  Procedures  . XR Cervical Spine 2 or 3 views  . XR Thoracic Spine 2 View   No orders of the defined types were placed in this encounter.     Procedures: No procedures performed   Clinical Data: No additional findings.  Objective: Vital Signs: There were no vitals taken for this visit.  Physical Exam   Constitutional: Patient appears well-developed HEENT:  Head: Normocephalic Eyes:EOM are normal Neck: Normal range of motion but it is somewhat tender with rotation to the left and right Cardiovascular: Normal rate Pulmonary/chest: Effort normal Neurologic: Patient is alert Skin: Skin is warm Psychiatric: Patient has normal mood and affect    Ortho Exam orthopedic exam demonstrates somewhat painful neck range of motion but good grip EPL FPL interosseous wrist flexion and wrist extension biceps triceps and deltoid strength.  Radial pulses intact bilaterally.  Forward flexion of the shoulders is over 90 bilaterally.  Patient really has no facial asymmetry extra upper movements are intact no paresthesias in the arms.  She does have some muscle spasm in the trapezial region.  No bruising is noted.  Patient is alert and oriented.  Specialty Comments:  No specialty comments available.  Imaging: Xr Thoracic Spine 2 View  Result Date: 03/23/2016 AP and lateral thoracic spine evaluated.  Slight scoliosis is present.  Visualized lung fields are clear.  Mild degenerative changes noted in the mid thoracic spine but no compression fractures are seen.  Xr Cervical Spine 2 Or 3 Views  Result Date: 03/23/2016 Cervical spine AP and lateral reviewed.  There are some loss of normal lordosis.  Degenerative spurring is present in the mid cervical spine at C5-6 and C6-7.  Mild facet arthritis is present.  No compression fractures or spondylolisthesis present.    PMFS History: Patient Active Problem List   Diagnosis Date Noted  . Cervicalgia 03/23/2016  . Pain in thoracic  spine 03/23/2016  . Acute back pain with sciatica, right 01/14/2016  . Right hip pain 01/14/2016  . Vertigo 04/14/2015  . Anxiety state 01/22/2015  . HLD (hyperlipidemia) 01/22/2015  . Type 2 diabetes mellitus with circulatory disorder ([place]) 01/22/2015  . Intracranial vascular stenosis 01/22/2015  . Aneurysm ([place])   . Headache   . Transient cerebral ischemia   . Diabetes mellitus with complication ([place]) 0000000  . Essential hypertension 11/12/2014  . Dependent edema 11/12/2014  . Peripheral neuropathy ([place]) 11/12/2014  . Depression 11/12/2014  . Facial droop   . Chest pain   . TIA (transient ischemic attack) 11/11/2014  . Dyspnea 08/06/2014  . Hyperthyroidism 08/06/2014  . Meniere's disease 06/07/2014  . History of CHF (congestive heart failure) 06/07/2014  . Essential hypertension, benign 06/13/2013  . Type II or unspecified type diabetes mellitus without mention of complication, not stated as uncontrolled 06/13/2013  . Goiter 06/13/2013  . Obesity, unspecified 06/13/2013   Past Medical History:  Diagnosis Date  . Cerebral aneurysm   . Complication of anesthesia HYPOTENSION INTRAOP W/ HYSTERECTOMY IN 1987--  DID OK W/ TOTAL KNEE IN 2008  . Diabetes mellitus   . DJD (degenerative joint disease)   . Dyslipidemia   . GERD (gastroesophageal reflux disease)   . H/O hiatal hernia   . History of CHF (congestive heart failure) 2010-  RESOLVED  . History of peptic ulcer YRS AGO  . Hypertension   . Meniere's disease   . Mitral valve prolapse occasional palpitations w/ chest discomfort  . OA (osteoarthritis)   . Rotator cuff tear, left   . Stroke [facility])     Family History  Problem Relation Age of Onset  . Heart disease Mother     in her 14s  . Diabetes Mother   . Kidney disease Mother   . Thyroid disease Mother   . Aneurysm Mother   . Ovarian cancer Mother   . Breast cancer Mother   . Thyroid disease Sister   . Heart disease Sister   . Lymphoma Father   . Thyroid  disease Brother   . Thyroid disease Paternal Grandmother   . Heart attack Sister     at age 35  . Breast cancer Maternal Aunt   . Breast cancer Maternal Grandmother   . Breast cancer Maternal Aunt   . Breast cancer Maternal Aunt     Past Surgical History:  Procedure Laterality Date  . BUNIONECTOMY  2007   LEFT FOOT  . LEFT SHOULDER SURGERY  1997   ROTATOR CUFF REPAIR  . SHOULDER ARTHROSCOPY  09/20/2011   Procedure: ARTHROSCOPY SHOULDER;  Surgeon: Johnn Hai, MD;  Location: [facility];  Service: Orthopedics;  Laterality: Left;  SAD AND MINI OPEN ROTATOR CUFF REPAIR    . TOTAL KNEE ARTHROPLASTY  09-12-2006  [facility]  RIGHT KNEE  . TUBAL LIGATION  1976  . TYMPANOPLASTY  1990;   1980  . VAGINAL HYSTERECTOMY  1987   Social History   Occupational History  . Not on file.   Social History Main Topics  . Smoking status: Never Smoker  . Smokeless tobacco: Never Used  . Alcohol use No  . Drug use: No  . Sexual activity: Not on file

## 2016-03-23 NOTE — ED Notes (Signed)
Pt returned from radiology and connected to the monitor. 

## 2016-03-23 NOTE — ED Provider Notes (Signed)
[name] DEPT Provider Note   CSN: [id number] Arrival date & time: 03/23/16  1725     History   Chief Complaint Chief Complaint  Patient presents with  . Fall  . Dizziness    HPI Sherry [name] is a 66 y.o. female.  HPI Pt was putting a wreath on her door yesterday.  She stepped back and looked up to look at the door when she became dizzy and lightheaded.  She stepped backwards and coulg not catch herself.  She fell to the ground.   She hit her head.  She did not get knocked out.    Since then she has had a headache.  She still has felt lightheaded.  No vomiting.  No weakness.  Some nausea.  No trouble with speech.  No numbness in her arms or legs but she did have some on her left upper lip but that resolved.  She saw her orthopedic doctor for follow up today and she told him about the sx.  He suggested coming to the ED.  She does take plavix. Past Medical History:  Diagnosis Date  . Cerebral aneurysm   . Complication of anesthesia HYPOTENSION INTRAOP W/ HYSTERECTOMY IN 1987--  DID OK W/ TOTAL KNEE IN 2008  . Diabetes mellitus   . DJD (degenerative joint disease)   . Dyslipidemia   . GERD (gastroesophageal reflux disease)   . H/O hiatal hernia   . History of CHF (congestive heart failure) 2010-  RESOLVED  . History of peptic ulcer YRS AGO  . Hypertension   . Meniere's disease   . Mitral valve prolapse occasional palpitations w/ chest discomfort  . OA (osteoarthritis)   . Rotator cuff tear, left   . Stroke [facility])     Patient Active Problem List   Diagnosis Date Noted  . Cervicalgia 03/23/2016  . Pain in thoracic spine 03/23/2016  . Acute back pain with sciatica, right 01/14/2016  . Right hip pain 01/14/2016  . Vertigo 04/14/2015  . Anxiety state 01/22/2015  . HLD (hyperlipidemia) 01/22/2015  . Type 2 diabetes mellitus with circulatory disorder ([place]) 01/22/2015  . Intracranial vascular stenosis 01/22/2015  . Aneurysm ([place])   . Headache   . Transient cerebral  ischemia   . Diabetes mellitus with complication ([place]) 0000000  . Essential hypertension 11/12/2014  . Dependent edema 11/12/2014  . Peripheral neuropathy ([place]) 11/12/2014  . Depression 11/12/2014  . Facial droop   . Chest pain   . TIA (transient ischemic attack) 11/11/2014  . Dyspnea 08/06/2014  . Hyperthyroidism 08/06/2014  . Meniere's disease 06/07/2014  . History of CHF (congestive heart failure) 06/07/2014  . Essential hypertension, benign 06/13/2013  . Type II or unspecified type diabetes mellitus without mention of complication, not stated as uncontrolled 06/13/2013  . Goiter 06/13/2013  . Obesity, unspecified 06/13/2013    Past Surgical History:  Procedure Laterality Date  . BUNIONECTOMY  2007   LEFT FOOT  . LEFT SHOULDER SURGERY  1997   ROTATOR CUFF REPAIR  . SHOULDER ARTHROSCOPY  09/20/2011   Procedure: ARTHROSCOPY SHOULDER;  Surgeon: Johnn Hai, MD;  Location: [facility]  Service: Orthopedics;  Laterality: Left;  SAD AND MINI OPEN ROTATOR CUFF REPAIR    . TOTAL KNEE ARTHROPLASTY  09-12-2006  [facility]   RIGHT KNEE  . TUBAL LIGATION  1976  . TYMPANOPLASTY  1990;   1980  . VAGINAL HYSTERECTOMY  1987    OB History    Gravida Para Term Preterm AB Living  6 4 4   2 4    SAB TAB Ectopic Multiple Live Births     2             Home Medications    Prior to Admission medications   Medication Sig Start Date End Date Taking? Authorizing Provider  albuterol (PROVENTIL HFA;VENTOLIN HFA) 108 (90 BASE) MCG/ACT inhaler Inhale 2 puffs into the lungs every 6 (six) hours as needed. For shortness of breath.    Historical Provider, MD  albuterol (PROVENTIL) (2.5 MG/3ML) 0.083% nebulizer solution Take 3 mLs (2.5 mg total) by nebulization every 6 (six) hours as needed for wheezing or shortness of breath. 06/24/14   Darlyne Russian, MD  aspirin EC 325 MG tablet Take 325 mg by mouth daily.    Historical Provider, MD  cetirizine (ZYRTEC) 10 MG tablet Take 1 tablet  (10 mg total) by mouth daily. 01/12/16   Darreld Mclean, MD  clopidogrel (PLAVIX) 75 MG tablet Take 1 tablet (75 mg total) by mouth daily. 01/12/16   Gay Filler Copland, MD  docusate sodium (COLACE) 100 MG capsule Take 100 mg by mouth daily.    Historical Provider, MD  empagliflozin (JARDIANCE) 10 MG TABS tablet Take 5 mg by mouth daily. 12/18/15   Gay Filler Copland, MD  furosemide (LASIX) 20 MG tablet Take 1 tab daily. 03/18/16   Gay Filler Copland, MD  gabapentin (NEURONTIN) 300 MG capsule Take 2 capsules (600 mg total) by mouth 2 (two) times daily. 03/18/16   Darreld Mclean, MD  HYDROcodone-acetaminophen (NORCO/VICODIN) 5-325 MG tablet Take 1 tablet by mouth every 4 (four) hours as needed. 06/29/15   Isla Pence, MD  ipratropium (ATROVENT) 0.03 % nasal spray Place 2 sprays into the nose 4 (four) times daily. Patient taking differently: Place 2 sprays into the nose every morning.  11/20/14   Gay Filler Copland, MD  linagliptin (TRADJENTA) 5 MG TABS tablet Take 0.5 tablets (2.5 mg total) by mouth daily. 12/18/15   Gay Filler Copland, MD  lisinopril (PRINIVIL,ZESTRIL) 20 MG tablet Take 1 tablet (20 mg total) by mouth daily. 03/18/16   Gay Filler Copland, MD  LORazepam (ATIVAN) 1 MG tablet Take 1 tablet (1 mg total) by mouth 3 (three) times daily as needed for anxiety. 06/29/15   Isla Pence, MD  metFORMIN (GLUCOPHAGE) 500 MG tablet Take 2 tablets (1,000 mg total) by mouth 2 (two) times daily. TAKE 2 TABLETS(1000 MG) BY MOUTH TWICE DAILY WITH A MEAL 01/12/16   Gay Filler Copland, MD  metoprolol tartrate (LOPRESSOR) 25 MG tablet Take 1 tablet (25 mg total) by mouth 2 (two) times daily. 03/18/16   Gay Filler Copland, MD  mometasone (ASMANEX) 220 MCG/INH inhaler Use 1 puff daily followed by rinsing your mouth out with water. 06/24/14   Darlyne Russian, MD  omeprazole (PRILOSEC) 20 MG capsule Take 1 capsule (20 mg total) by mouth daily. 02/22/16   Gay Filler Copland, MD  QUEtiapine (SEROQUEL) 100 MG tablet Take 0.5  tablets (50 mg total) by mouth at bedtime. 01/12/16   Gay Filler Copland, MD  sertraline (ZOLOFT) 50 MG tablet Take 0.5 tablets (25 mg total) by mouth at bedtime. 01/12/16   Darreld Mclean, MD    Family History Family History  Problem Relation Age of Onset  . Heart disease Mother     in her 75s  . Diabetes Mother   . Kidney disease Mother   . Thyroid disease Mother   . Aneurysm Mother   .  Ovarian cancer Mother   . Breast cancer Mother   . Thyroid disease Sister   . Heart disease Sister   . Lymphoma Father   . Thyroid disease Brother   . Thyroid disease Paternal Grandmother   . Heart attack Sister     at age 74  . Breast cancer Maternal Aunt   . Breast cancer Maternal Grandmother   . Breast cancer Maternal Aunt   . Breast cancer Maternal Aunt     Social History Social History  Substance Use Topics  . Smoking status: Never Smoker  . Smokeless tobacco: Never Used  . Alcohol use No     Allergies   Influenza vaccines; Codeine; Demerol; Nsaids; Prednisone; and Tramadol   Review of Systems Review of Systems  All other systems reviewed and are negative.    Physical Exam Updated Vital Signs BP 148/83 (BP Location: Right Arm)   Pulse 60   Temp 97.8 F (36.6 C) (Oral)   Resp 14   Ht 5\' 2"  (1.575 m)   Wt 85.7 kg   SpO2 100%   BMI 34.57 kg/m   Physical Exam  Constitutional: She appears well-developed and well-nourished. No distress.  HENT:  Head: Normocephalic and atraumatic.  Right Ear: External ear normal.  Left Ear: External ear normal.  Eyes: Conjunctivae are normal. Right eye exhibits no discharge. Left eye exhibits no discharge. No scleral icterus.  Neck: Neck supple. No tracheal deviation present.  Cardiovascular: Normal rate, regular rhythm and intact distal pulses.   Pulmonary/Chest: Effort normal and breath sounds normal. No stridor. No respiratory distress. She has no wheezes. She has no rales.  Abdominal: Soft. Bowel sounds are normal. She exhibits  no distension. There is no tenderness. There is no rebound and no guarding.  Musculoskeletal: She exhibits no edema.       Cervical back: She exhibits tenderness.       Thoracic back: Normal.       Lumbar back: Normal.  Neurological: She is alert. She has normal strength. No cranial nerve deficit (no facial droop, extraocular movements intact, no slurred speech) or sensory deficit. She exhibits normal muscle tone. She displays no seizure activity. Coordination normal.  Skin: Skin is warm and dry. No rash noted.  Psychiatric: She has a normal mood and affect.  Nursing note and vitals reviewed.    ED Treatments / Results  Labs (all labs ordered are listed, but only abnormal results are displayed) Labs Reviewed  BASIC METABOLIC PANEL - Abnormal; Notable for the following:       Result Value   Glucose, Bld 112 (*)    All other components within normal limits  CBC    EKG  EKG Interpretation  Date/Time:  Tuesday March 23 2016 17:57:41 EST Ventricular Rate:  60 PR Interval:    QRS Duration: 104 QT Interval:  443 QTC Calculation: 443 R Axis:   78 Text Interpretation:  Sinus rhythm Borderline T abnormalities, anterior leads No significant change since last tracing Confirmed by Aalayah Riles  MD-J, Ariyona Eid ([id number]) on 03/23/2016 6:09:25 PM       Radiology Dg Thoracic Spine 2 View  Result Date: 03/23/2016 CLINICAL DATA:  Pain following fall EXAM: THORACIC SPINE 3 VIEWS COMPARISON:  Chest radiograph Jun 24, 2014 FINDINGS: Frontal, lateral, and swimmer's views were obtained. There is mid thoracic levoscoliosis. There is no appreciable fracture or spondylolisthesis. There is disc space narrowing at multiple levels with multiple right lateral osteophytes. No erosive change or paraspinous lesion. IMPRESSION: Multilevel osteoarthritic  change. Scoliosis. No fracture or spondylolisthesis. Electronically Signed   By: Lowella Grip III M.D.   On: 03/23/2016 18:46   Dg Lumbar Spine Complete  Result  Date: 03/23/2016 CLINICAL DATA:  Pain following fall EXAM: LUMBAR SPINE - COMPLETE 4+ VIEW COMPARISON:  Lumbar MRI January 30, 2016 FINDINGS: Frontal, lateral, spot lumbosacral lateral, and bilateral oblique views were obtained. There are 5 non-rib-bearing lumbar type vertebral bodies. There is no fracture or spondylolisthesis. There is moderate disc space narrowing at T12-L1, L1-2, and L5-S1. There is facet osteoarthritic change at L4-5 and L5-S1 bilaterally. IMPRESSION: Osteoarthritic change at several levels. No fracture or spondylolisthesis. Electronically Signed   By: Lowella Grip III M.D.   On: 03/23/2016 18:48   Ct Head Wo Contrast  Result Date: 03/23/2016 CLINICAL DATA:  Pain following fall EXAM: CT HEAD WITHOUT CONTRAST CT CERVICAL SPINE WITHOUT CONTRAST TECHNIQUE: Multidetector CT imaging of the head and cervical spine was performed following the standard protocol without intravenous contrast. Multiplanar CT image reconstructions of the cervical spine were also generated. COMPARISON:  Head CT Jul 07, 2015 FINDINGS: CT HEAD FINDINGS Brain: The ventricles are normal in size and configuration. There is no intracranial mass, hemorrhage, extra-axial fluid collection, or midline shift. Gray-white compartments appear normal. No evident acute infarct. Vascular: No hyperdense vessel. There is calcification in each carotid siphon region. Skull: There is frontal hyperostosis bilaterally. Bony calvarium appears intact. Sinuses/Orbits: There is a retention cyst in the inferior right maxillary antrum. There is slight mucosal thickening in several ethmoid air cells bilaterally. Other visualized paranasal sinuses are clear. Orbits appear symmetric bilaterally. Other: Mastoid air cells are clear. CT CERVICAL SPINE FINDINGS Alignment: There is mid cervical dextroscoliosis. No spondylolisthesis. Skull base and vertebrae: The craniocervical junction and skull base regions are normal. There is no demonstrable  fracture. There are no blastic or lytic bone lesions. Soft tissues and spinal canal: Prevertebral soft tissues and predental space regions are normal. There is no paraspinous lesion. There is no cord or canal hematoma. No spinal stenosis. Disc levels: There is moderately severe disc space narrowing at C6-7. There is somewhat milder disc space narrowing at all other levels. There is facet hypertrophy at multiple levels. There is exit foraminal narrowing with impression on the exiting nerve roots due to bony hypertrophy at C3-4 on the right, at C4-5 on the right, at C5-6 on the right, and at C6-7 on the right. No frank disc extrusion. Upper chest: Visualized lung regions are clear. Other: There is a dominant mass occupying the isthmus and much of the left lobe of the thyroid measuring 5.0 x 2.2 cm. IMPRESSION: CT head: No intracranial mass hemorrhage, or extra-axial fluid collection. No acute infarct evident. Foci of arterial vascular calcification noted. Areas of mild paranasal sinus disease noted. CT cervical spine: No fracture or spondylolisthesis. Multilevel arthropathy. **An incidental finding of potential clinical significance has been found. Dominant mass arising in the isthmus and left lobe of the thyroid. Consider further evaluation with thyroid ultrasound. If patient is clinically hyperthyroid, consider nuclear medicine thyroid uptake and scan. ** Electronically Signed   By: Lowella Grip III M.D.   On: 03/23/2016 19:02   Ct Cervical Spine Wo Contrast  Result Date: 03/23/2016 CLINICAL DATA:  Pain following fall EXAM: CT HEAD WITHOUT CONTRAST CT CERVICAL SPINE WITHOUT CONTRAST TECHNIQUE: Multidetector CT imaging of the head and cervical spine was performed following the standard protocol without intravenous contrast. Multiplanar CT image reconstructions of the cervical spine were also generated. COMPARISON:  Head CT Jul 07, 2015 FINDINGS: CT HEAD FINDINGS Brain: The ventricles are normal in size and  configuration. There is no intracranial mass, hemorrhage, extra-axial fluid collection, or midline shift. Gray-white compartments appear normal. No evident acute infarct. Vascular: No hyperdense vessel. There is calcification in each carotid siphon region. Skull: There is frontal hyperostosis bilaterally. Bony calvarium appears intact. Sinuses/Orbits: There is a retention cyst in the inferior right maxillary antrum. There is slight mucosal thickening in several ethmoid air cells bilaterally. Other visualized paranasal sinuses are clear. Orbits appear symmetric bilaterally. Other: Mastoid air cells are clear. CT CERVICAL SPINE FINDINGS Alignment: There is mid cervical dextroscoliosis. No spondylolisthesis. Skull base and vertebrae: The craniocervical junction and skull base regions are normal. There is no demonstrable fracture. There are no blastic or lytic bone lesions. Soft tissues and spinal canal: Prevertebral soft tissues and predental space regions are normal. There is no paraspinous lesion. There is no cord or canal hematoma. No spinal stenosis. Disc levels: There is moderately severe disc space narrowing at C6-7. There is somewhat milder disc space narrowing at all other levels. There is facet hypertrophy at multiple levels. There is exit foraminal narrowing with impression on the exiting nerve roots due to bony hypertrophy at C3-4 on the right, at C4-5 on the right, at C5-6 on the right, and at C6-7 on the right. No frank disc extrusion. Upper chest: Visualized lung regions are clear. Other: There is a dominant mass occupying the isthmus and much of the left lobe of the thyroid measuring 5.0 x 2.2 cm. IMPRESSION: CT head: No intracranial mass hemorrhage, or extra-axial fluid collection. No acute infarct evident. Foci of arterial vascular calcification noted. Areas of mild paranasal sinus disease noted. CT cervical spine: No fracture or spondylolisthesis. Multilevel arthropathy. **An incidental finding of  potential clinical significance has been found. Dominant mass arising in the isthmus and left lobe of the thyroid. Consider further evaluation with thyroid ultrasound. If patient is clinically hyperthyroid, consider nuclear medicine thyroid uptake and scan. ** Electronically Signed   By: Lowella Grip III M.D.   On: 03/23/2016 19:02   Dg Bone Density  Result Date: 03/22/2016 EXAM: DUAL X-RAY ABSORPTIOMETRY (DXA) FOR BONE MINERAL DENSITY IMPRESSION: Referring Physician:  Darreld Mclean PATIENT: Name: Sherry [name], Sherry [name] Patient ID: [id number] Birth Date: [birth year] Height: 61.5 in. Sex: Female Measured: 03/22/2016 Weight: 188.2 lbs. Indications: African-American, Diabetic, Estrogen Deficiency, Hysterectomy, Low Calcium Intake, Post Menopausal Fractures: Treatments: Jardiance, Metformin Tragenta ASSESSMENT: The BMD measured at Femur Neck Left is 0.935 g/cm2 with a T-score of -0.7. This patient is considered NORMAL according to [facility]) criteria. L-1 & 2 were excluded due to degenerative changes. Site Region Measured Date Measured Age WHO YA BMD Classification T-score AP Spine L3-L4 03/22/2016 65.2 Normal 1.5 1.384 g/cm2 DualFemur Neck Left 3/22/2016 65.2 years Normal -0.7 0.935 g/cm2 World Health Organization [facility]) criteria for post-menopausal, Caucasian Women: Normal       T-score at or above -1 SD Osteopenia   T-score between -1 and -2.5 SD Osteoporosis T-score at or below -2.5 SD RECOMMENDATION: [place] recommends that FDA-approved medical therapies be considered in postmenopausal women and men age 82 or older with a: 1. Hip or vertebral (clinical or morphometric) fracture. 2. T-score of < -2.5 at the spine or hip. 3. Ten-year fracture probability by FRAX of 3% or greater for hip fracture or 20% or greater for major osteoporotic fracture. All treatment decisions require clinical judgment and consideration of individual patient factors,  including patient  preferences, co-morbidities, previous drug use, risk factors not captured in the FRAX model (e.g. falls, vitamin D deficiency, increased bone turnover, interval significant decline in bone density) and possible under - or over-estimation of fracture risk by FRAX. All patients should ensure an adequate intake of dietary calcium (1200 mg/d) and vitamin D (800 IU daily) unless contraindicated. FOLLOW-UP: People with diagnosed cases of osteoporosis or at high risk for fracture should have regular bone mineral density tests. For patients eligible for Medicare, routine testing is allowed once every 2 years. The testing frequency can be increased to one year for patients who have rapidly progressing disease, those who are receiving or discontinuing medical therapy to restore bone mass, or have additional risk factors. I have reviewed this report and agree with the above findings. Mark A. Thornton Papas, M.D. [facility] Electronically Signed   By: Lavonia Dana M.D.   On: 03/22/2016 09:30   Xr Thoracic Spine 2 View  Result Date: 03/23/2016 AP and lateral thoracic spine evaluated.  Slight scoliosis is present.  Visualized lung fields are clear.  Mild degenerative changes noted in the mid thoracic spine but no compression fractures are seen.  Xr Cervical Spine 2 Or 3 Views  Result Date: 03/23/2016 Cervical spine AP and lateral reviewed.  There are some loss of normal lordosis.  Degenerative spurring is present in the mid cervical spine at C5-6 and C6-7.  Mild facet arthritis is present.  No compression fractures or spondylolisthesis present.   Procedures Procedures (including critical care time)  Medications Ordered in ED Medications - No data to display   Initial Impression / Assessment and Plan / ED Course  I have reviewed the triage vital signs and the nursing notes.  Pertinent labs & imaging results that were available during my care of the patient were reviewed by me and considered in my medical  decision making (see chart for details).  Clinical Course as of Mar 23 1945  Tue Mar 23, 2016  1928 Reviewed xray findings with patient.  She is aware of the thyroid nodule.  She saw Dr Loanne Drilling for that.  [JK]    Clinical Course User Index [JK] Dorie Rank, MD   No evidence of serious injury associated with the fall.  Consistent with soft tissue injury/strain.  No neuro abnormalities to suggest stroke, tia.   Labs are reassuring.  Explained findings to patient and warning signs that should prompt return to the ED.   Final Clinical Impressions(s) / ED Diagnoses   Final diagnoses:  Minor head injury, initial encounter  Thyroid mass    New Prescriptions New Prescriptions   No medications on file     Dorie Rank, MD 03/23/16 1947

## 2016-03-23 NOTE — ED Triage Notes (Signed)
Pt BIB [facility] from Ortho office. Pt was at a visit today for a previous ortho issue and reported to her MD that she had a fall yesterday, hitting her head. Pt denies LOC but reports to EMS that she has been dizzy since the fall, had nausea, and numbness to the lips. Pt c/o Right neck pain. Ortho doctor recommended pt come to ED since pt is on Plavix and daily aspirin. Hx stroke X2 and aneurysm.

## 2016-03-23 NOTE — Patient Instructions (Signed)

## 2016-03-23 NOTE — ED Notes (Signed)
Patient transported to radiology
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
## 2016-04-01 ENCOUNTER — Encounter: Payer: Self-pay | Admitting: Family Medicine

## 2016-04-07 DIAGNOSIS — E119 Type 2 diabetes mellitus without complications: Secondary | ICD-10-CM | POA: Diagnosis not present

## 2016-04-19 MED FILL — CLOPIDOGREL 75 MG TABLET: 75 | 90 days supply | Qty: 90 | Fill #1

## 2016-04-19 MED FILL — SERTRALINE HCL 50 MG TABLET: 50 | 90 days supply | Qty: 45 | Fill #1

## 2016-04-19 MED FILL — metFORMIN HCL 500 MG TABS: 500 | 90 days supply | Qty: 360 | Fill #1

## 2016-04-23 ENCOUNTER — Ambulatory Visit (INDEPENDENT_AMBULATORY_CARE_PROVIDER_SITE_OTHER): Payer: Medicare Other

## 2016-04-23 ENCOUNTER — Ambulatory Visit (INDEPENDENT_AMBULATORY_CARE_PROVIDER_SITE_OTHER): Payer: Medicare Other | Admitting: Orthopaedic Surgery

## 2016-04-23 ENCOUNTER — Encounter (INDEPENDENT_AMBULATORY_CARE_PROVIDER_SITE_OTHER): Payer: Self-pay | Admitting: Orthopaedic Surgery

## 2016-04-23 VITALS — BP 126/75 | HR 66 | Ht 61.0 in | Wt 190.0 lb

## 2016-04-23 DIAGNOSIS — M1712 Unilateral primary osteoarthritis, left knee: Secondary | ICD-10-CM | POA: Diagnosis not present

## 2016-04-23 DIAGNOSIS — M25562 Pain in left knee: Secondary | ICD-10-CM | POA: Diagnosis not present

## 2016-04-23 NOTE — Progress Notes (Signed)
Office Visit Note   Patient: Sherry [name]           Date of Birth: 29-Dec-1950           MRN: [id number] Visit Date: 04/23/2016              Requested by: Darreld Mclean, MD [address] [place], [place] [address] PCP: [name], MD   Assessment & Plan: Visit Diagnoses:  1. Acute pain of left knee   2. Unilateral primary osteoarthritis, left knee     Plan: we discussed use of a cane in her opposite hand using on the right hand help with the left knee osteoarthritis. X-ray results were reviewed with the patient.previous rotator cuff repair on the left but not on the right.Continued intermittent ice to her knee. Modification of activities discussed. Recheck 4 weeks.  Follow-Up Instructions: Return in about 4 weeks (around 05/21/2016).   Orders:  Orders Placed This Encounter  Procedures  . XR KNEE 3 VIEW LEFT   No orders of the defined types were placed in this encounter.     Procedures: No procedures performed   Clinical Data: No additional findings.   Subjective: Chief Complaint  Patient presents with  . Left Knee - Pain    Patient presents with left knee pain after falling off of a porch in February. She was being seen in the office by Dr. Marlou Sa, but had a concussion and he sent her straight to the ED. She states the knee buckles and she is afraid that she is going to fall again. It was red, swollen, and hot but that is better. The pain is unbearable and keeps her up at night. She ambulates with a cane. She has tried tylenol, ibuprofen (which gives her palpitations) and heat.     Review of Systems  Constitutional: Negative for chills and diaphoresis.  HENT: Negative for ear discharge, ear pain and nosebleeds.   Eyes: Negative for discharge and visual disturbance.  Respiratory: Negative for cough, choking and shortness of breath.   Cardiovascular: Negative for chest pain and palpitations.  Gastrointestinal: Negative for abdominal  distention and abdominal pain.  Endocrine: Negative for cold intolerance and heat intolerance.  Genitourinary: Negative for flank pain and hematuria.  Skin: Negative for rash and wound.  Neurological: Negative for seizures and speech difficulty.  Hematological: Negative for adenopathy. Does not bruise/bleed easily.  Psychiatric/Behavioral: Negative for agitation and suicidal ideas.  Positive for goiter, obesity, Mnire's disease history of congestive heart failure. Hypothyroidism diabetes and hypertension. Depression and peripheral neuropathy. Previous epidurals 3 [facility]. Diabetes is significantly improved. A1c one year ago was 9.8 and now is 6.3 Objective: Vital Signs: BP 126/75   Pulse 66   Ht 5\' 1"  (1.549 m)   Wt 190 lb (86.2 kg)   BMI 35.90 kg/m   Physical Exam  Constitutional: She is oriented to person, place, and time. She appears well-developed.  HENT:  Head: Normocephalic.  Right Ear: External ear normal.  Left Ear: External ear normal.  Eyes: Pupils are equal, round, and reactive to light.  Neck: No tracheal deviation present. No thyromegaly present.  Cardiovascular: Normal rate.   Pulmonary/Chest: Effort normal.  Abdominal: Soft.  Musculoskeletal:  Well-healed right total knee arthroplasty. Skin over the left knee is normal after her fall previous month. Collateral cruciate ligament exam is normal. She has tenderness of the joint line small palpable osteophytes crepitus with range of motion. Full extension and flexion to 120. Some  sciatic notch tenderness.  Neurological: She is alert and oriented to person, place, and time.  Skin: Skin is warm and dry.  Psychiatric: She has a normal mood and affect. Her behavior is normal.    Ortho Exam  Specialty Comments:  No specialty comments available.  Imaging: No results found.   PMFS History: Patient Active Problem List   Diagnosis Date Noted  . Cervicalgia 03/23/2016  . Pain in thoracic spine  03/23/2016  . Acute back pain with sciatica, right 01/14/2016  . Right hip pain 01/14/2016  . Vertigo 04/14/2015  . Anxiety state 01/22/2015  . HLD (hyperlipidemia) 01/22/2015  . Type 2 diabetes mellitus with circulatory disorder ([place]) 01/22/2015  . Intracranial vascular stenosis 01/22/2015  . Aneurysm ([place])   . Headache   . Transient cerebral ischemia   . Diabetes mellitus with complication ([place]) 29/93/7169  . Essential hypertension 11/12/2014  . Dependent edema 11/12/2014  . Peripheral neuropathy ([place]) 11/12/2014  . Depression 11/12/2014  . Facial droop   . Chest pain   . TIA (transient ischemic attack) 11/11/2014  . Dyspnea 08/06/2014  . Hyperthyroidism 08/06/2014  . Meniere's disease 06/07/2014  . History of CHF (congestive heart failure) 06/07/2014  . Essential hypertension, benign 06/13/2013  . Type II or unspecified type diabetes mellitus without mention of complication, not stated as uncontrolled 06/13/2013  . Goiter 06/13/2013  . Obesity, unspecified 06/13/2013   Past Medical History:  Diagnosis Date  . Cerebral aneurysm   . Complication of anesthesia HYPOTENSION INTRAOP W/ HYSTERECTOMY IN 1987--  DID OK W/ TOTAL KNEE IN 2008  . Diabetes mellitus   . DJD (degenerative joint disease)   . Dyslipidemia   . GERD (gastroesophageal reflux disease)   . H/O hiatal hernia   . History of CHF (congestive heart failure) 2010-  RESOLVED  . History of peptic ulcer YRS AGO  . Hypertension   . Meniere's disease   . Mitral valve prolapse occasional palpitations w/ chest discomfort  . OA (osteoarthritis)   . Rotator cuff tear, left   . Stroke [facility])     Family History  Problem Relation Age of Onset  . Heart disease Mother     in her 88s  . Diabetes Mother   . Kidney disease Mother   . Thyroid disease Mother   . Aneurysm Mother   . Ovarian cancer Mother   . Breast cancer Mother   . Thyroid disease Sister   . Heart disease Sister   . Lymphoma Father   . Thyroid disease  Brother   . Thyroid disease Paternal Grandmother   . Heart attack Sister     at age 25  . Breast cancer Maternal Aunt   . Breast cancer Maternal Grandmother   . Breast cancer Maternal Aunt   . Breast cancer Maternal Aunt     Past Surgical History:  Procedure Laterality Date  . BUNIONECTOMY  2007   LEFT FOOT  . LEFT SHOULDER SURGERY  1997   ROTATOR CUFF REPAIR  . SHOULDER ARTHROSCOPY  09/20/2011   Procedure: ARTHROSCOPY SHOULDER;  Surgeon: Johnn Hai, MD;  Location: [facility];  Service: Orthopedics;  Laterality: Left;  SAD AND MINI OPEN ROTATOR CUFF REPAIR    . TOTAL KNEE ARTHROPLASTY  09-12-2006  [facility]   RIGHT KNEE  . TUBAL LIGATION  1976  . TYMPANOPLASTY  1990;   1980  . VAGINAL HYSTERECTOMY  1987   Social History   Occupational History  . Not on file.  Social History Main Topics  . Smoking status: Never Smoker  . Smokeless tobacco: Never Used  . Alcohol use No  . Drug use: No  . Sexual activity: Not on file

## 2016-04-28 ENCOUNTER — Encounter: Payer: Self-pay | Admitting: *Deleted

## 2016-04-28 ENCOUNTER — Ambulatory Visit: Payer: Medicare Other | Admitting: Family Medicine

## 2016-04-28 ENCOUNTER — Ambulatory Visit: Payer: Medicare Other | Admitting: Endocrinology

## 2016-05-19 ENCOUNTER — Encounter: Payer: Self-pay | Admitting: Endocrinology

## 2016-05-19 ENCOUNTER — Ambulatory Visit (INDEPENDENT_AMBULATORY_CARE_PROVIDER_SITE_OTHER): Payer: Medicare Other | Admitting: Endocrinology

## 2016-05-19 VITALS — BP 118/84 | HR 72 | Ht 61.0 in | Wt 195.0 lb

## 2016-05-19 DIAGNOSIS — E118 Type 2 diabetes mellitus with unspecified complications: Secondary | ICD-10-CM

## 2016-05-19 DIAGNOSIS — E1159 Type 2 diabetes mellitus with other circulatory complications: Secondary | ICD-10-CM | POA: Diagnosis not present

## 2016-05-19 LAB — TSH: TSH: 0.68 u[IU]/mL (ref 0.35–4.50)

## 2016-05-19 LAB — POCT GLYCOSYLATED HEMOGLOBIN (HGB A1C): Hemoglobin A1C: 6.8

## 2016-05-19 MED ORDER — LINAGLIPTIN 5 MG PO TABS: 2.5000 mg | ORAL_TABLET | Freq: Every day | ORAL | 3 refills | Status: DC

## 2016-05-19 MED ORDER — EMPAGLIFLOZIN 10 MG PO TABS: 5.0000 mg | ORAL_TABLET | Freq: Every day | ORAL | 3 refills | Status: DC

## 2016-05-19 MED FILL — JARDIANCE 10 MG TABLET: 10 | 90 days supply | Qty: 45 | Fill #0

## 2016-05-19 MED FILL — TRADJENTA 5 MG TABLET: 5 | 90 days supply | Qty: 45 | Fill #0

## 2016-05-19 NOTE — Patient Instructions (Addendum)
Please continue the same medications for diabetes.  A thyroid blood test is requested for you today.  We'll let you know about the results. Please come back for a follow-up appointment in 6 months.   We'll plan to recheck the thyroid ultrasound next year.

## 2016-05-19 NOTE — Progress Notes (Signed)
Subjective:    Patient ID: Sherry [name], female    DOB: November 10, 1950, 66 y.o.   MRN: [id number]  HPI Pt returns for f/u of diabetes mellitus: DM type: 2 Dx'ed: 7902 Complications: painful polyneuropathy and TIA.  Therapy: 3 oral meds.  GDM: never.  DKA: never. Severe hypoglycemia: never.  Pancreatitis: never. Other: she has never been on insulin; she did not tolerate victoza (sob).  Interval history: She takes meds as rx'ed, and tolerates well.  pt states she feels well in general.  She had RAI for hyperthyroidism in mid-2016 (was due to multinodular goiter, noted on CT; after RAI, she became euthyroid off any rx; f/u US in 2017 again showed the goiter).  She does not notice the goiter.  Past Medical History:  Diagnosis Date  . Cerebral aneurysm   . Complication of anesthesia HYPOTENSION INTRAOP W/ HYSTERECTOMY IN 1987--  DID OK W/ TOTAL KNEE IN 2008  . Diabetes mellitus   . DJD (degenerative joint disease)   . Dyslipidemia   . GERD (gastroesophageal reflux disease)   . H/O hiatal hernia   . History of CHF (congestive heart failure) 2010-  RESOLVED  . History of peptic ulcer YRS AGO  . Hypertension   . Meniere's disease   . Mitral valve prolapse occasional palpitations w/ chest discomfort  . OA (osteoarthritis)   . Rotator cuff tear, left   . Stroke [facility])     Past Surgical History:  Procedure Laterality Date  . BUNIONECTOMY  2007   LEFT FOOT  . LEFT SHOULDER SURGERY  1997   ROTATOR CUFF REPAIR  . SHOULDER ARTHROSCOPY  09/20/2011   Procedure: ARTHROSCOPY SHOULDER;  Surgeon: Johnn Hai, MD;  Location: [facility];  Service: Orthopedics;  Laterality: Left;  SAD AND MINI OPEN ROTATOR CUFF REPAIR    . TOTAL KNEE ARTHROPLASTY  09-12-2006  [facility]   RIGHT KNEE  . TUBAL LIGATION  1976  . TYMPANOPLASTY  1990;   1980  . VAGINAL HYSTERECTOMY  1987    Social History   Social History  . Marital status: Widowed    Spouse name: N/A  . Number of  children: N/A  . Years of education: N/A   Occupational History  . Not on file.   Social History Main Topics  . Smoking status: Never Smoker  . Smokeless tobacco: Never Used  . Alcohol use No  . Drug use: No  . Sexual activity: Not on file   Other Topics Concern  . Not on file   Social History Narrative  . No narrative on file    Current Outpatient Prescriptions on File Prior to Visit  Medication Sig Dispense Refill  . albuterol (PROVENTIL HFA;VENTOLIN HFA) 108 (90 BASE) MCG/ACT inhaler Inhale 2 puffs into the lungs every 6 (six) hours as needed. For shortness of breath.    Marland Kitchen albuterol (PROVENTIL) (2.5 MG/3ML) 0.083% nebulizer solution Take 3 mLs (2.5 mg total) by nebulization every 6 (six) hours as needed for wheezing or shortness of breath. 150 mL 1  . aspirin EC 325 MG tablet Take 325 mg by mouth daily.    . cetirizine (ZYRTEC) 10 MG tablet Take 1 tablet (10 mg total) by mouth daily. 90 tablet 3  . clopidogrel (PLAVIX) 75 MG tablet Take 1 tablet (75 mg total) by mouth daily. 90 tablet 3  . docusate sodium (COLACE) 100 MG capsule Take 100 mg by mouth daily.    . furosemide (LASIX) 20 MG tablet Take 1 tab  daily. 90 tablet 3  . gabapentin (NEURONTIN) 300 MG capsule Take 2 capsules (600 mg total) by mouth 2 (two) times daily. 360 capsule 1  . HYDROcodone-acetaminophen (NORCO/VICODIN) 5-325 MG tablet Take 1 tablet by mouth every 4 (four) hours as needed. 10 tablet 0  . ipratropium (ATROVENT) 0.03 % nasal spray Place 2 sprays into the nose 4 (four) times daily. (Patient taking differently: Place 2 sprays into the nose every morning. ) 30 mL 6  . lisinopril (PRINIVIL,ZESTRIL) 20 MG tablet Take 1 tablet (20 mg total) by mouth daily. 90 tablet 3  . LORazepam (ATIVAN) 1 MG tablet Take 1 tablet (1 mg total) by mouth 3 (three) times daily as needed for anxiety. 15 tablet 0  . metFORMIN (GLUCOPHAGE) 500 MG tablet Take 2 tablets (1,000 mg total) by mouth 2 (two) times daily. TAKE 2  TABLETS(1000 MG) BY MOUTH TWICE DAILY WITH A MEAL 360 tablet 3  . metoprolol tartrate (LOPRESSOR) 25 MG tablet Take 1 tablet (25 mg total) by mouth 2 (two) times daily. 180 tablet 3  . mometasone (ASMANEX) 220 MCG/INH inhaler Use 1 puff daily followed by rinsing your mouth out with water. 1 Inhaler 12  . omeprazole (PRILOSEC) 20 MG capsule Take 1 capsule (20 mg total) by mouth daily. 30 capsule 12  . QUEtiapine (SEROQUEL) 100 MG tablet Take 0.5 tablets (50 mg total) by mouth at bedtime. 45 tablet 3  . sertraline (ZOLOFT) 50 MG tablet Take 0.5 tablets (25 mg total) by mouth at bedtime. 45 tablet 3   Current Facility-Administered Medications on File Prior to Visit  Medication Dose Route Frequency Provider Last Rate Last Dose  . 0.9 %  sodium chloride infusion  500 mL Intravenous Continuous Kavitha Nandigam V, MD      . lidocaine (PF) (XYLOCAINE) 1 % injection 0.3 mL  0.3 mL Other Once Magnus Sinning, MD        Allergies  Allergen Reactions  . Influenza Vaccines Anaphylaxis  . Codeine Itching  . Demerol Other (See Comments)    SEIZURE  . Nsaids Other (See Comments)    NAPROXEN, IBUPROFEN, SKELAXIN---  CAUSES PALPITATIONS  . Prednisone     Caused her glucose to go up to 500 and went to ED  . Tramadol     Contraindicated with Victoza     Family History  Problem Relation Age of Onset  . Heart disease Mother     in her 22s  . Diabetes Mother   . Kidney disease Mother   . Thyroid disease Mother   . Aneurysm Mother   . Ovarian cancer Mother   . Breast cancer Mother   . Thyroid disease Sister   . Heart disease Sister   . Lymphoma Father   . Thyroid disease Brother   . Thyroid disease Paternal Grandmother   . Heart attack Sister     at age 87  . Breast cancer Maternal Aunt   . Breast cancer Maternal Grandmother   . Breast cancer Maternal Aunt   . Breast cancer Maternal Aunt     BP 118/84   Pulse 72   Ht 5\' 1"  (1.549 m)   Wt 195 lb (88.5 kg)   SpO2 98%   BMI 36.84 kg/m     Review of Systems Denies leg swelling.     Objective:   Physical Exam VITAL SIGNS:  See vs page GENERAL: no distress NECK: thyroid is approx 3-4 times normal size (R>L), but I can't palpate any nodule.  Pulses: dorsalis pedis intact bilat.   MSK: no deformity of the feet.  CV: no leg edema.   Skin:  no ulcer on the feet, but there are several heavy calluses.  normal color and temp on the feet. Neuro: sensation is intact to touch on the feet.   Lab Results  Component Value Date   TSH 0.68 05/19/2016      Assessment & Plan:  DM: well-controlled.  Please continue the same medications. Multinodular goiter: stable on PE.  hyperthyroidism: resolved with RAI, but she is at risk for recurrence.    Patient Instructions  Please continue the same medications for diabetes.  A thyroid blood test is requested for you today.  We'll let you know about the results. Please come back for a follow-up appointment in 6 months.   We'll plan to recheck the thyroid ultrasound next year.

## 2016-06-21 MED FILL — METOPROLOL TARTRATE 25 MG T: 25 | 90 days supply | Qty: 180 | Fill #1

## 2016-06-21 MED FILL — GABAPENTIN 300 MG CAPSULE: 300 | 90 days supply | Qty: 360 | Fill #1

## 2016-06-21 MED FILL — LISINOPRIL 20 MG TABLET: 20 | 90 days supply | Qty: 90 | Fill #1

## 2016-06-21 MED FILL — FUROSEMIDE 20 MG TABLET: 20 | 90 days supply | Qty: 90 | Fill #1

## 2016-06-22 ENCOUNTER — Ambulatory Visit: Payer: Medicare Other | Admitting: Podiatry

## 2016-07-14 ENCOUNTER — Other Ambulatory Visit: Payer: Self-pay | Admitting: Emergency Medicine

## 2016-07-14 DIAGNOSIS — I10 Essential (primary) hypertension: Secondary | ICD-10-CM

## 2016-07-14 DIAGNOSIS — E118 Type 2 diabetes mellitus with unspecified complications: Secondary | ICD-10-CM

## 2016-07-16 ENCOUNTER — Other Ambulatory Visit: Payer: Self-pay | Admitting: Emergency Medicine

## 2016-07-16 DIAGNOSIS — Z01818 Encounter for other preprocedural examination: Secondary | ICD-10-CM

## 2016-07-16 DIAGNOSIS — I10 Essential (primary) hypertension: Secondary | ICD-10-CM

## 2016-07-16 DIAGNOSIS — E118 Type 2 diabetes mellitus with unspecified complications: Secondary | ICD-10-CM

## 2016-07-16 DIAGNOSIS — R0982 Postnasal drip: Secondary | ICD-10-CM

## 2016-07-16 DIAGNOSIS — F320 Major depressive disorder, single episode, mild: Secondary | ICD-10-CM

## 2016-07-16 MED ORDER — EMPAGLIFLOZIN 10 MG PO TABS: 5.0000 mg | ORAL_TABLET | Freq: Every day | ORAL | 3 refills | Status: DC

## 2016-07-16 MED ORDER — ALBUTEROL SULFATE HFA 108 (90 BASE) MCG/ACT IN AERS: 2.0000 | INHALATION_SPRAY | Freq: Four times a day (QID) | RESPIRATORY_TRACT | 2 refills | Status: DC | PRN

## 2016-07-16 MED ORDER — OMEPRAZOLE 20 MG PO CPDR: 20.0000 mg | DELAYED_RELEASE_CAPSULE | Freq: Every day | ORAL | 12 refills | Status: DC

## 2016-07-16 MED ORDER — DOCUSATE SODIUM 100 MG PO CAPS: 100.0000 mg | ORAL_CAPSULE | Freq: Every day | ORAL | 2 refills | Status: DC

## 2016-07-16 MED ORDER — QUETIAPINE FUMARATE 100 MG PO TABS: 50.0000 mg | ORAL_TABLET | Freq: Every day | ORAL | 3 refills | Status: DC

## 2016-07-16 MED ORDER — CLOPIDOGREL BISULFATE 75 MG PO TABS: 75.0000 mg | ORAL_TABLET | Freq: Every day | ORAL | 3 refills | Status: DC

## 2016-07-16 MED ORDER — GABAPENTIN 300 MG PO CAPS: 600.0000 mg | ORAL_CAPSULE | Freq: Two times a day (BID) | ORAL | 1 refills | Status: DC

## 2016-07-16 MED ORDER — FUROSEMIDE 20 MG PO TABS: ORAL_TABLET | ORAL | 3 refills | Status: DC

## 2016-07-16 MED ORDER — CETIRIZINE HCL 10 MG PO TABS: 10.0000 mg | ORAL_TABLET | Freq: Every day | ORAL | 3 refills | Status: DC

## 2016-07-16 MED ORDER — METFORMIN HCL 500 MG PO TABS: 1000.0000 mg | ORAL_TABLET | Freq: Two times a day (BID) | ORAL | 3 refills | Status: DC

## 2016-07-16 MED ORDER — LISINOPRIL 20 MG PO TABS: 20.0000 mg | ORAL_TABLET | Freq: Every day | ORAL | 3 refills | Status: DC

## 2016-07-16 MED ORDER — IPRATROPIUM BROMIDE 0.03 % NA SOLN: 2.0000 | Freq: Four times a day (QID) | NASAL | 6 refills | Status: DC

## 2016-07-16 MED ORDER — ALBUTEROL SULFATE (2.5 MG/3ML) 0.083% IN NEBU: 2.5000 mg | INHALATION_SOLUTION | Freq: Four times a day (QID) | RESPIRATORY_TRACT | 1 refills | Status: DC | PRN

## 2016-07-16 MED ORDER — ASPIRIN EC 325 MG PO TBEC: 325.0000 mg | DELAYED_RELEASE_TABLET | Freq: Every day | ORAL | 1 refills | Status: DC

## 2016-07-16 MED ORDER — METOPROLOL TARTRATE 25 MG PO TABS: 25.0000 mg | ORAL_TABLET | Freq: Two times a day (BID) | ORAL | 3 refills | Status: DC

## 2016-07-16 MED ORDER — LINAGLIPTIN 5 MG PO TABS: 2.5000 mg | ORAL_TABLET | Freq: Every day | ORAL | 3 refills | Status: DC

## 2016-07-16 MED ORDER — SERTRALINE HCL 50 MG PO TABS: 25.0000 mg | ORAL_TABLET | Freq: Every day | ORAL | 3 refills | Status: DC

## 2016-07-16 NOTE — Telephone Encounter (Signed)
-----   Message from Darreld Mclean, MD sent at 07/16/2016  2:23 PM EDT ----- Can you please ask pt if Monday is ok to refill her pain medication?  I will look at the [facility] to see how things looks.  If she needs today can ask doc of the day if they feel comfortable refilling

## 2016-07-16 NOTE — Telephone Encounter (Signed)
Spoke with pt who states that she does have 4 pills left and that she is fine with waiting until Monday to have this medication refilled.

## 2016-07-16 NOTE — Telephone Encounter (Signed)
Indication for chronic opioid: peripheral neuopathy Medication and dose: hydrocodone  # pills per month: rarely used Last UDS date: NA ` Pain contract signed (Y/N): NA  Date narcotic database last reviewed (include red flags): today- no entries gong back a year It looks like her last rx was for 10 pills of vicodin 5 given 06/29/15

## 2016-07-16 NOTE — Telephone Encounter (Signed)
Received refill request from [facility] for QUEtiapine (SEROQUEL) 100 MG tablet. [facility] not currently listed on pt's pharmacy listed. Called to verify if pt needs this medication refilled and if so which pharmacy it should go to.   No answer, left message for pt to return call.

## 2016-07-16 NOTE — Telephone Encounter (Signed)
Spoke to pt regarding refill request. Pt needs all meds sent to [facility]. I have sent over all meds except one controlled med.  Pt  request HYDROcodone-acetaminophen. Please advise.

## 2016-07-18 ENCOUNTER — Encounter: Payer: Self-pay | Admitting: Family Medicine

## 2016-07-18 NOTE — Telephone Encounter (Signed)
-----   Message from Sherry [name], [place] sent at 07/16/2016  2:22 PM EDT ----- Regarding: Cardiology  Pt states that she will need knee surgery but is required to see a cardiologist first. Pt request referral to cardiology. Please advise.

## 2016-07-19 ENCOUNTER — Encounter: Payer: Self-pay | Admitting: Family Medicine

## 2016-07-26 ENCOUNTER — Telehealth: Payer: Self-pay | Admitting: Family Medicine

## 2016-07-26 NOTE — Telephone Encounter (Signed)
Received a notice from [facility]- consider changing away from her omeprazole as she is also on plavix and these 2 may interfere.  Called pt- she is not having any trouble with GERD really- she will stop her omeprazole and let me know if she has any trouble. If she does need something will consider an H2 blocker instead

## 2016-07-26 NOTE — Telephone Encounter (Signed)
Caller name: Relationship to patient: Can be reached: [phone] Pharmacy:  [facility], [place] [phone] (Phone) [phone] (Fax)     Reason for call: Need clarification on Rx sent in.

## 2016-07-30 ENCOUNTER — Ambulatory Visit: Payer: Medicare Other | Admitting: Cardiovascular Disease

## 2016-07-31 ENCOUNTER — Other Ambulatory Visit: Payer: Self-pay | Admitting: Family Medicine

## 2016-07-31 MED ORDER — ALBUTEROL SULFATE HFA 108 (90 BASE) MCG/ACT IN AERS: 2.0000 | INHALATION_SPRAY | Freq: Four times a day (QID) | RESPIRATORY_TRACT | 3 refills | Status: DC | PRN

## 2016-08-11 ENCOUNTER — Encounter: Payer: Self-pay | Admitting: Family Medicine

## 2016-08-13 ENCOUNTER — Encounter (INDEPENDENT_AMBULATORY_CARE_PROVIDER_SITE_OTHER): Payer: Self-pay | Admitting: Orthopedic Surgery

## 2016-08-16 ENCOUNTER — Encounter: Payer: Self-pay | Admitting: Internal Medicine

## 2016-08-16 ENCOUNTER — Ambulatory Visit (INDEPENDENT_AMBULATORY_CARE_PROVIDER_SITE_OTHER): Payer: Medicare Other | Admitting: Internal Medicine

## 2016-08-16 VITALS — BP 112/66 | HR 63 | Ht 61.0 in | Wt 190.0 lb

## 2016-08-16 DIAGNOSIS — Z01810 Encounter for preprocedural cardiovascular examination: Secondary | ICD-10-CM | POA: Insufficient documentation

## 2016-08-16 DIAGNOSIS — E1159 Type 2 diabetes mellitus with other circulatory complications: Secondary | ICD-10-CM

## 2016-08-16 DIAGNOSIS — E782 Mixed hyperlipidemia: Secondary | ICD-10-CM

## 2016-08-16 DIAGNOSIS — G451 Carotid artery syndrome (hemispheric): Secondary | ICD-10-CM

## 2016-08-16 DIAGNOSIS — I10 Essential (primary) hypertension: Secondary | ICD-10-CM

## 2016-08-16 NOTE — Patient Instructions (Signed)
Your physician recommends that you schedule a follow-up appointment as needed with Dr. Hilty.  

## 2016-08-16 NOTE — Progress Notes (Signed)
Grossly normal  OFFICE FOLLOW-UP NOTE  Chief Complaint:  Preoperative risk assessment  Primary Care Physician: Darreld Mclean, MD  HPI:  Sherry [name] is a 66 y.o. female with a past medial history significant for cerebral aneurysm (not coiled), recurrent TIA, type 2 diabetes, dyslipidemia, mitral valve prolapse, reported congestive heart failure with normal LV EF on echo, hypertension, GERD and hiatal hernia, and degenerative joint disease with prior total knee replacement, who presents with left knee pain desiring left total knee replacement by Dr. Marlou Sa. She previously saw Dr. Johnsie Cancel in 2016, but does not recall the visit. Later she mentioned that she wanted to see me because he did not have any appointments. At the time she presented for intermittent chest discomfort which is described as sharp and fleeting. This was thought to be atypical. She underwent an echocardiogram in stress Myoview in 2016 which was negative for ischemia. She was also consult a by cardiac EP for an implanted loop recorder but declined at the time. She had a repeat echo in March 2017 which showed EF 60-65%, normal LV wall thickness, no regional wall motion abnormalities. No diagnostic mitral valve prolapse was noted. Interestingly, she was told the past that she may need mitral valve replacement however there is no evidence of significant dysfunction of the mitral valve. She denies any chest pain or worsening shortness of breath with exertion and can do more than 4 METS of activity.  PMHx:  Past Medical History:  Diagnosis Date  . Cerebral aneurysm   . Complication of anesthesia HYPOTENSION INTRAOP W/ HYSTERECTOMY IN 1987--  DID OK W/ TOTAL KNEE IN 2008  . Diabetes mellitus   . DJD (degenerative joint disease)   . Dyslipidemia   . GERD (gastroesophageal reflux disease)   . H/O hiatal hernia   . History of CHF (congestive heart failure) 2010-  RESOLVED  . History of peptic ulcer YRS AGO  . Hypertension   .  Meniere's disease   . Mitral valve prolapse occasional palpitations w/ chest discomfort  . OA (osteoarthritis)   . Rotator cuff tear, left   . Stroke [facility])     Past Surgical History:  Procedure Laterality Date  . BUNIONECTOMY  2007   LEFT FOOT  . LEFT SHOULDER SURGERY  1997   ROTATOR CUFF REPAIR  . SHOULDER ARTHROSCOPY  09/20/2011   Procedure: ARTHROSCOPY SHOULDER;  Surgeon: Johnn Hai, MD;  Location: [facility];  Service: Orthopedics;  Laterality: Left;  SAD AND MINI OPEN ROTATOR CUFF REPAIR    . TOTAL KNEE ARTHROPLASTY  09-12-2006  [facility]   RIGHT KNEE  . TUBAL LIGATION  1976  . TYMPANOPLASTY  1990;   1980  . VAGINAL HYSTERECTOMY  1987    FAMHx:  Family History  Problem Relation Age of Onset  . Heart disease Mother        in her 20s  . Diabetes Mother   . Kidney disease Mother   . Thyroid disease Mother   . Aneurysm Mother   . Ovarian cancer Mother   . Breast cancer Mother   . Thyroid disease Sister   . Heart disease Sister   . Lymphoma Father   . Thyroid disease Brother   . Thyroid disease Paternal Grandmother   . Heart attack Sister        at age 21  . Breast cancer Maternal Aunt   . Breast cancer Maternal Grandmother   . Breast cancer Maternal Aunt   . Breast cancer Maternal  Aunt   . Other Brother        Car accident  . Other Son        Gun Shoot wound    SOCHx:   reports that she has never smoked. She has never used smokeless tobacco. She reports that she does not drink alcohol or use drugs.  ALLERGIES:  Allergies  Allergen Reactions  . Influenza Vaccines Anaphylaxis  . Codeine Itching  . Demerol Other (See Comments)    SEIZURE  . Nsaids Other (See Comments)    NAPROXEN, IBUPROFEN, SKELAXIN---  CAUSES PALPITATIONS  . Prednisone     Caused her glucose to go up to 500 and went to ED  . Tramadol     Contraindicated with Victoza     ROS: Pertinent items noted in HPI and remainder of comprehensive ROS otherwise negative.  HOME  MEDS: Current Outpatient Prescriptions on File Prior to Visit  Medication Sig Dispense Refill  . albuterol (PROVENTIL HFA;VENTOLIN HFA) 108 (90 Base) MCG/ACT inhaler Inhale 2 puffs into the lungs every 6 (six) hours as needed. For shortness of breath. 18 g 2  . albuterol (PROVENTIL) (2.5 MG/3ML) 0.083% nebulizer solution Take 3 mLs (2.5 mg total) by nebulization every 6 (six) hours as needed for wheezing or shortness of breath. 150 mL 1  . aspirin EC 325 MG tablet Take 1 tablet (325 mg total) by mouth daily. 90 tablet 1  . cetirizine (ZYRTEC) 10 MG tablet Take 1 tablet (10 mg total) by mouth daily. 90 tablet 3  . clopidogrel (PLAVIX) 75 MG tablet Take 1 tablet (75 mg total) by mouth daily. 90 tablet 3  . docusate sodium (COLACE) 100 MG capsule Take 1 capsule (100 mg total) by mouth daily. 10 capsule 2  . empagliflozin (JARDIANCE) 10 MG TABS tablet Take 5 mg by mouth daily. 45 tablet 3  . furosemide (LASIX) 20 MG tablet Take 1 tab daily. 90 tablet 3  . gabapentin (NEURONTIN) 300 MG capsule Take 2 capsules (600 mg total) by mouth 2 (two) times daily. 360 capsule 1  . HYDROcodone-acetaminophen (NORCO/VICODIN) 5-325 MG tablet Take 1 tablet by mouth every 4 (four) hours as needed. 10 tablet 0  . ipratropium (ATROVENT) 0.03 % nasal spray Place 2 sprays into the nose 4 (four) times daily. 30 mL 6  . linagliptin (TRADJENTA) 5 MG TABS tablet Take 0.5 tablets (2.5 mg total) by mouth daily. 45 tablet 3  . lisinopril (PRINIVIL,ZESTRIL) 20 MG tablet Take 1 tablet (20 mg total) by mouth daily. 90 tablet 3  . LORazepam (ATIVAN) 1 MG tablet Take 1 tablet (1 mg total) by mouth 3 (three) times daily as needed for anxiety. 15 tablet 0  . metFORMIN (GLUCOPHAGE) 500 MG tablet Take 2 tablets (1,000 mg total) by mouth 2 (two) times daily. TAKE 2 TABLETS(1000 MG) BY MOUTH TWICE DAILY WITH A MEAL 360 tablet 3  . metoprolol tartrate (LOPRESSOR) 25 MG tablet Take 1 tablet (25 mg total) by mouth 2 (two) times daily. 180  tablet 3  . mometasone (ASMANEX) 220 MCG/INH inhaler Use 1 puff daily followed by rinsing your mouth out with water. 1 Inhaler 12  . QUEtiapine (SEROQUEL) 100 MG tablet Take 0.5 tablets (50 mg total) by mouth at bedtime. 45 tablet 3  . sertraline (ZOLOFT) 50 MG tablet Take 0.5 tablets (25 mg total) by mouth at bedtime. 45 tablet 3   Current Facility-Administered Medications on File Prior to Visit  Medication Dose Route Frequency Provider Last Rate Last Dose  .  0.9 %  sodium chloride infusion  500 mL Intravenous Continuous Nandigam, Kavitha V, MD      . lidocaine (PF) (XYLOCAINE) 1 % injection 0.3 mL  0.3 mL Other Once Magnus Sinning, MD        LABS/IMAGING: No results found for this or any previous visit (from the past 48 hour(s)). No results found.  LIPID PANEL:    Component Value Date/Time   CHOL 154 03/18/2016 1143   TRIG 78.0 03/18/2016 1143   HDL 64.20 03/18/2016 1143   CHOLHDL 2 03/18/2016 1143   VLDL 15.6 03/18/2016 1143   LDLCALC 74 03/18/2016 1143     WEIGHTS: Wt Readings from Last 3 Encounters:  08/16/16 190 lb (86.2 kg)  05/19/16 195 lb (88.5 kg)  04/23/16 190 lb (86.2 kg)    VITALS: BP 112/66   Pulse 63   Ht 5\' 1"  (1.549 m)   Wt 190 lb (86.2 kg)   BMI 35.90 kg/m   EXAM: General appearance: alert and no distress Neck: no carotid bruit and no JVD Lungs: clear to auscultation bilaterally Heart: regular rate and rhythm Abdomen: soft, non-tender; bowel sounds normal; no masses,  no organomegaly Extremities: extremities normal, atraumatic, no cyanosis or edema Pulses: 2+ and symmetric Skin: Skin color, texture, turgor normal. No rashes or lesions Neurologic: GroPsych: Pleasanty normal Psych: Pleasant  EKG: Normal sinus rhythm at 63, nonspecific ST and T-wave changes - personally reviewed  ASSESSMENT: 1. Low risk for upcoming knee surgery 2. Hypertension 3. Dyslipidemia 4. History of recurrent TIA 5. Cerebral aneurysm  PLAN: 1.   Mrs. Dworkin is  at low risk for upcoming knee surgery. She has hypertension which is well controlled. LDL-C is 74 based on labs 5 months ago on statin therapy. She's had recurrent TIA and is on appropriate antiplatelet therapy. She is followed by Dr. Erlinda Hong and Dr. Estanislado Pandy with neurology/interventional radiology. No further cardiac testing at this time. I'm happy to see her back on an as-needed basis.  Pixie Casino, MD, [place]  Attending Cardiologist  Direct Dial: [phone]  Fax: [phone]  Website:  [email or web address]  Nadean Corwin Cristan Scherzer 08/16/2016, 9:11 AM

## 2016-08-18 ENCOUNTER — Ambulatory Visit: Payer: Medicare Other | Admitting: Internal Medicine

## 2016-08-19 ENCOUNTER — Emergency Department (HOSPITAL_COMMUNITY): Payer: Medicare Other

## 2016-08-19 ENCOUNTER — Inpatient Hospital Stay (HOSPITAL_COMMUNITY)
Admission: EM | Admit: 2016-08-19 | Discharge: 2016-08-21 | DRG: 203 | Disposition: A | Discharge status: Home / Self Care | Payer: Medicare Other | Attending: Family Medicine | Admitting: Family Medicine

## 2016-08-19 ENCOUNTER — Encounter (HOSPITAL_COMMUNITY): Payer: Self-pay

## 2016-08-19 DIAGNOSIS — R05 Cough: Secondary | ICD-10-CM | POA: Diagnosis not present

## 2016-08-19 DIAGNOSIS — J189 Pneumonia, unspecified organism: Secondary | ICD-10-CM

## 2016-08-19 DIAGNOSIS — Z90710 Acquired absence of both cervix and uterus: Secondary | ICD-10-CM

## 2016-08-19 DIAGNOSIS — J45901 Unspecified asthma with (acute) exacerbation: Principal | ICD-10-CM | POA: Diagnosis present

## 2016-08-19 DIAGNOSIS — Z8249 Family history of ischemic heart disease and other diseases of the circulatory system: Secondary | ICD-10-CM

## 2016-08-19 DIAGNOSIS — Z885 Allergy status to narcotic agent status: Secondary | ICD-10-CM | POA: Diagnosis not present

## 2016-08-19 DIAGNOSIS — J4541 Moderate persistent asthma with (acute) exacerbation: Secondary | ICD-10-CM | POA: Diagnosis not present

## 2016-08-19 DIAGNOSIS — Z833 Family history of diabetes mellitus: Secondary | ICD-10-CM | POA: Diagnosis not present

## 2016-08-19 DIAGNOSIS — K219 Gastro-esophageal reflux disease without esophagitis: Secondary | ICD-10-CM | POA: Diagnosis present

## 2016-08-19 DIAGNOSIS — Z9851 Tubal ligation status: Secondary | ICD-10-CM

## 2016-08-19 DIAGNOSIS — Z79899 Other long term (current) drug therapy: Secondary | ICD-10-CM

## 2016-08-19 DIAGNOSIS — Z8673 Personal history of transient ischemic attack (TIA), and cerebral infarction without residual deficits: Secondary | ICD-10-CM | POA: Diagnosis not present

## 2016-08-19 DIAGNOSIS — Z887 Allergy status to serum and vaccine status: Secondary | ICD-10-CM | POA: Diagnosis not present

## 2016-08-19 DIAGNOSIS — Z823 Family history of stroke: Secondary | ICD-10-CM | POA: Diagnosis not present

## 2016-08-19 DIAGNOSIS — J4551 Severe persistent asthma with (acute) exacerbation: Secondary | ICD-10-CM | POA: Diagnosis not present

## 2016-08-19 DIAGNOSIS — E118 Type 2 diabetes mellitus with unspecified complications: Secondary | ICD-10-CM | POA: Diagnosis not present

## 2016-08-19 DIAGNOSIS — Z807 Family history of other malignant neoplasms of lymphoid, hematopoietic and related tissues: Secondary | ICD-10-CM | POA: Diagnosis not present

## 2016-08-19 DIAGNOSIS — Z8711 Personal history of peptic ulcer disease: Secondary | ICD-10-CM

## 2016-08-19 DIAGNOSIS — Z888 Allergy status to other drugs, medicaments and biological substances status: Secondary | ICD-10-CM | POA: Diagnosis not present

## 2016-08-19 DIAGNOSIS — Z9889 Other specified postprocedural states: Secondary | ICD-10-CM | POA: Diagnosis not present

## 2016-08-19 DIAGNOSIS — R9431 Abnormal electrocardiogram [ECG] [EKG]: Secondary | ICD-10-CM | POA: Diagnosis not present

## 2016-08-19 DIAGNOSIS — Z7982 Long term (current) use of aspirin: Secondary | ICD-10-CM

## 2016-08-19 DIAGNOSIS — F419 Anxiety disorder, unspecified: Secondary | ICD-10-CM | POA: Diagnosis present

## 2016-08-19 DIAGNOSIS — J690 Pneumonitis due to inhalation of food and vomit: Secondary | ICD-10-CM | POA: Diagnosis not present

## 2016-08-19 DIAGNOSIS — R0789 Other chest pain: Secondary | ICD-10-CM | POA: Diagnosis not present

## 2016-08-19 DIAGNOSIS — I10 Essential (primary) hypertension: Secondary | ICD-10-CM | POA: Diagnosis present

## 2016-08-19 DIAGNOSIS — M1990 Unspecified osteoarthritis, unspecified site: Secondary | ICD-10-CM | POA: Diagnosis present

## 2016-08-19 DIAGNOSIS — Z841 Family history of disorders of kidney and ureter: Secondary | ICD-10-CM

## 2016-08-19 DIAGNOSIS — Z96651 Presence of right artificial knee joint: Secondary | ICD-10-CM | POA: Diagnosis not present

## 2016-08-19 DIAGNOSIS — Z8349 Family history of other endocrine, nutritional and metabolic diseases: Secondary | ICD-10-CM | POA: Diagnosis not present

## 2016-08-19 DIAGNOSIS — Z8041 Family history of malignant neoplasm of ovary: Secondary | ICD-10-CM | POA: Diagnosis not present

## 2016-08-19 DIAGNOSIS — Z803 Family history of malignant neoplasm of breast: Secondary | ICD-10-CM

## 2016-08-19 DIAGNOSIS — E119 Type 2 diabetes mellitus without complications: Secondary | ICD-10-CM | POA: Diagnosis not present

## 2016-08-19 LAB — BASIC METABOLIC PANEL
Anion gap: 11 (ref 5–15)
BUN: 18 mg/dL (ref 6–20)
CO2: 25 mmol/L (ref 22–32)
Calcium: 9.1 mg/dL (ref 8.9–10.3)
Chloride: 104 mmol/L (ref 101–111)
Creatinine, Ser: 0.76 mg/dL (ref 0.44–1.00)
GFR calc Af Amer: >60 mL/min (ref >60)
GFR calc non Af Amer: >60 mL/min (ref >60)
Glucose, Bld: 120 mg/dL — ABNORMAL HIGH (ref 65–99)
Potassium: 3.2 mmol/L — ABNORMAL LOW (ref 3.5–5.1)
Sodium: 140 mmol/L (ref 135–145)

## 2016-08-19 LAB — CBC
HCT: 45 % (ref 36.0–46.0)
Hemoglobin: 14.1 g/dL (ref 12.0–15.0)
MCH: 28.3 pg (ref 26.0–34.0)
MCHC: 31.3 g/dL (ref 30.0–36.0)
MCV: 90.4 fL (ref 78.0–100.0)
Platelets: 233 10*3/uL (ref 150–400)
RBC: 4.98 MIL/uL (ref 3.87–5.11)
RDW: 13.9 % (ref 11.5–15.5)
WBC: 7.4 10*3/uL (ref 4.0–10.5)

## 2016-08-19 LAB — I-STAT TROPONIN, ED: Troponin i, poc: 0 ng/mL (ref 0.00–0.08)

## 2016-08-19 LAB — I-STAT CG4 LACTIC ACID, ED: Lactic Acid, Venous: 1.34 mmol/L (ref 0.5–1.9)

## 2016-08-19 MED ORDER — DEXTROSE 5 % IV SOLN
1.0000 g | Freq: Once | INTRAVENOUS | Status: AC
  Administered 2016-08-19: 1 g via INTRAVENOUS
  Filled 2016-08-19: qty 10

## 2016-08-19 MED ORDER — ACETAMINOPHEN 650 MG RE SUPP: 650.0000 mg | Freq: Four times a day (QID) | RECTAL | Status: DC | PRN

## 2016-08-19 MED ORDER — ALBUTEROL SULFATE (2.5 MG/3ML) 0.083% IN NEBU: 3.0000 mL | INHALATION_SOLUTION | Freq: Four times a day (QID) | RESPIRATORY_TRACT | Status: DC | PRN

## 2016-08-19 MED ORDER — ACETAMINOPHEN 500 MG PO TABS
1000.0000 mg | ORAL_TABLET | Freq: Once | ORAL | Status: AC
  Administered 2016-08-19: 1000 mg via ORAL
  Filled 2016-08-19: qty 2

## 2016-08-19 MED ORDER — METOPROLOL TARTRATE 25 MG PO TABS
25.0000 mg | ORAL_TABLET | Freq: Two times a day (BID) | ORAL | Status: DC
  Administered 2016-08-20 – 2016-08-21 (×4): 25 mg via ORAL
  Filled 2016-08-19 (×4): qty 1

## 2016-08-19 MED ORDER — ALBUTEROL SULFATE (2.5 MG/3ML) 0.083% IN NEBU
5.0000 mg | INHALATION_SOLUTION | Freq: Once | RESPIRATORY_TRACT | Status: AC
  Administered 2016-08-19: 5 mg via RESPIRATORY_TRACT

## 2016-08-19 MED ORDER — ACETAMINOPHEN 325 MG PO TABS
650.0000 mg | ORAL_TABLET | Freq: Four times a day (QID) | ORAL | Status: DC | PRN
  Administered 2016-08-20 – 2016-08-21 (×2): 650 mg via ORAL
  Filled 2016-08-19 (×2): qty 2

## 2016-08-19 MED ORDER — FAMOTIDINE 10 MG PO TABS
10.0000 mg | ORAL_TABLET | Freq: Every day | ORAL | Status: DC
  Administered 2016-08-20 – 2016-08-21 (×2): 10 mg via ORAL
  Filled 2016-08-19 (×3): qty 1

## 2016-08-19 MED ORDER — ALBUTEROL SULFATE (2.5 MG/3ML) 0.083% IN NEBU
INHALATION_SOLUTION | RESPIRATORY_TRACT | Status: AC
  Filled 2016-08-19: qty 3

## 2016-08-19 MED ORDER — ASPIRIN EC 325 MG PO TBEC
325.0000 mg | DELAYED_RELEASE_TABLET | Freq: Every day | ORAL | Status: DC
  Administered 2016-08-20 – 2016-08-21 (×2): 325 mg via ORAL
  Filled 2016-08-19 (×2): qty 1

## 2016-08-19 MED ORDER — DEXTROSE 5 % IV SOLN
1.0000 g | INTRAVENOUS | Status: DC
  Administered 2016-08-20: 1 g via INTRAVENOUS
  Filled 2016-08-19 (×2): qty 10

## 2016-08-19 MED ORDER — POTASSIUM CHLORIDE CRYS ER 20 MEQ PO TBCR
40.0000 meq | EXTENDED_RELEASE_TABLET | Freq: Once | ORAL | Status: AC
  Administered 2016-08-19: 40 meq via ORAL
  Filled 2016-08-19: qty 2

## 2016-08-19 MED ORDER — ONDANSETRON HCL 4 MG PO TABS
4.0000 mg | ORAL_TABLET | Freq: Four times a day (QID) | ORAL | Status: DC | PRN
Start: 2016-08-19 — End: 2016-08-21

## 2016-08-19 MED ORDER — DEXTROSE 5 % IV SOLN
500.0000 mg | INTRAVENOUS | Status: DC
  Administered 2016-08-20: 500 mg via INTRAVENOUS
  Filled 2016-08-19 (×2): qty 500

## 2016-08-19 MED ORDER — IPRATROPIUM BROMIDE 0.06 % NA SOLN
2.0000 | Freq: Four times a day (QID) | NASAL | Status: DC
  Administered 2016-08-20 – 2016-08-21 (×4): 2 via NASAL
  Filled 2016-08-19: qty 15

## 2016-08-19 MED ORDER — SERTRALINE HCL 25 MG PO TABS
25.0000 mg | ORAL_TABLET | Freq: Every day | ORAL | Status: DC
  Administered 2016-08-20 (×2): 25 mg via ORAL
  Filled 2016-08-19 (×3): qty 1

## 2016-08-19 MED ORDER — GABAPENTIN 300 MG PO CAPS
600.0000 mg | ORAL_CAPSULE | Freq: Two times a day (BID) | ORAL | Status: DC
  Administered 2016-08-20 – 2016-08-21 (×4): 600 mg via ORAL
  Filled 2016-08-19 (×4): qty 2

## 2016-08-19 MED ORDER — LORATADINE 10 MG PO TABS
10.0000 mg | ORAL_TABLET | Freq: Every day | ORAL | Status: DC
  Administered 2016-08-20 – 2016-08-21 (×2): 10 mg via ORAL
  Filled 2016-08-19 (×2): qty 1

## 2016-08-19 MED ORDER — METHYLPREDNISOLONE SODIUM SUCC 125 MG IJ SOLR
60.0000 mg | Freq: Four times a day (QID) | INTRAMUSCULAR | Status: DC
  Administered 2016-08-20 – 2016-08-21 (×8): 60 mg via INTRAVENOUS
  Filled 2016-08-19 (×8): qty 2

## 2016-08-19 MED ORDER — BENZONATATE 100 MG PO CAPS
200.0000 mg | ORAL_CAPSULE | Freq: Once | ORAL | Status: AC
  Administered 2016-08-19: 200 mg via ORAL
  Filled 2016-08-19: qty 2

## 2016-08-19 MED ORDER — IPRATROPIUM-ALBUTEROL 0.5-2.5 (3) MG/3ML IN SOLN: 3.0000 mL | RESPIRATORY_TRACT | Status: DC | PRN

## 2016-08-19 MED ORDER — IPRATROPIUM-ALBUTEROL 0.5-2.5 (3) MG/3ML IN SOLN
3.0000 mL | Freq: Four times a day (QID) | RESPIRATORY_TRACT | Status: DC
  Administered 2016-08-20: 3 mL via RESPIRATORY_TRACT
  Filled 2016-08-19: qty 3

## 2016-08-19 MED ORDER — LISINOPRIL 20 MG PO TABS
20.0000 mg | ORAL_TABLET | Freq: Every day | ORAL | Status: DC
  Administered 2016-08-20 – 2016-08-21 (×2): 20 mg via ORAL
  Filled 2016-08-19 (×2): qty 1

## 2016-08-19 MED ORDER — DEXTROSE 5 % IV SOLN
500.0000 mg | Freq: Once | INTRAVENOUS | Status: AC
  Administered 2016-08-19: 500 mg via INTRAVENOUS
  Filled 2016-08-19: qty 500

## 2016-08-19 MED ORDER — FUROSEMIDE 20 MG PO TABS
20.0000 mg | ORAL_TABLET | Freq: Every day | ORAL | Status: DC
  Administered 2016-08-20 – 2016-08-21 (×2): 20 mg via ORAL
  Filled 2016-08-19 (×2): qty 1

## 2016-08-19 MED ORDER — ONDANSETRON HCL 4 MG/2ML IJ SOLN: 4.0000 mg | Freq: Four times a day (QID) | INTRAMUSCULAR | Status: DC | PRN

## 2016-08-19 MED ORDER — QUETIAPINE FUMARATE 50 MG PO TABS
50.0000 mg | ORAL_TABLET | Freq: Every day | ORAL | Status: DC
  Administered 2016-08-20 (×2): 50 mg via ORAL
  Filled 2016-08-19 (×2): qty 1

## 2016-08-19 MED ORDER — BENZONATATE 100 MG PO CAPS
100.0000 mg | ORAL_CAPSULE | Freq: Three times a day (TID) | ORAL | Status: DC | PRN
  Filled 2016-08-19: qty 1

## 2016-08-19 MED ORDER — SODIUM CHLORIDE 0.9 % IV SOLN
INTRAVENOUS | Status: AC
  Administered 2016-08-20: 01:00:00 via INTRAVENOUS

## 2016-08-19 MED ORDER — ENOXAPARIN SODIUM 40 MG/0.4ML ~~LOC~~ SOLN
40.0000 mg | SUBCUTANEOUS | Status: DC
  Administered 2016-08-20 – 2016-08-21 (×2): 40 mg via SUBCUTANEOUS
  Filled 2016-08-19 (×2): qty 0.4

## 2016-08-19 MED ORDER — CLOPIDOGREL BISULFATE 75 MG PO TABS
75.0000 mg | ORAL_TABLET | Freq: Every day | ORAL | Status: DC
  Administered 2016-08-20 – 2016-08-21 (×2): 75 mg via ORAL
  Filled 2016-08-19 (×2): qty 1

## 2016-08-19 MED ORDER — BUDESONIDE 0.25 MG/2ML IN SUSP
0.2500 mg | Freq: Two times a day (BID) | RESPIRATORY_TRACT | Status: DC
  Administered 2016-08-20 – 2016-08-21 (×3): 0.25 mg via RESPIRATORY_TRACT
  Filled 2016-08-19 (×3): qty 2

## 2016-08-19 NOTE — ED Provider Notes (Signed)
[facility] DEPT Provider Note   CSN: [id number] Arrival date & time: 08/19/16  1800     History   Chief Complaint Chief Complaint  Patient presents with  . Shortness of Breath    HPI Sherry [name] is a 66 y.o. female. Complains of shortness of breath onset 6 days ago accompanied by cough productive of yellow sputum and fever. She's had episodes of posttussive vomiting. She is treated herself with her home nebulizer treatment which she says causes her to vomit. Her breathing is not improved after treatment with nebulizer or with meter dose inhaler. Associated symptoms include subjective fever and generalized weakness no other associated symptoms. She also suffers from a burning sensation in her anterior chest which is worse with coughing, not improved with anything.  HPI  Past Medical History:  Diagnosis Date  . Cerebral aneurysm   . Complication of anesthesia HYPOTENSION INTRAOP W/ HYSTERECTOMY IN 1987--  DID OK W/ TOTAL KNEE IN 2008  . Diabetes mellitus   . DJD (degenerative joint disease)   . Dyslipidemia   . GERD (gastroesophageal reflux disease)   . H/O hiatal hernia   . History of CHF (congestive heart failure) 2010-  RESOLVED  . History of peptic ulcer YRS AGO  . Hypertension   . Meniere's disease   . Mitral valve prolapse occasional palpitations w/ chest discomfort  . OA (osteoarthritis)   . Rotator cuff tear, left   . Stroke [facility])     Patient Active Problem List   Diagnosis Date Noted  . Preoperative cardiovascular examination 08/16/2016  . Cervicalgia 03/23/2016  . Acute back pain with sciatica, right 01/14/2016  . Vertigo 04/14/2015  . Anxiety state 01/22/2015  . HLD (hyperlipidemia) 01/22/2015  . Type 2 diabetes mellitus with circulatory disorder ([place]) 01/22/2015  . Intracranial vascular stenosis 01/22/2015  . Aneurysm ([place])   . Headache   . Diabetes mellitus with complication ([place]) 24/46/2863  . Essential hypertension 11/12/2014  . Dependent  edema 11/12/2014  . Peripheral neuropathy 11/12/2014  . Depression 11/12/2014  . Facial droop   . Chest pain   . TIA (transient ischemic attack) 11/11/2014  . Dyspnea 08/06/2014  . Hyperthyroidism 08/06/2014  . Meniere's disease 06/07/2014  . History of CHF (congestive heart failure) 06/07/2014  . Essential hypertension, benign 06/13/2013  . Goiter 06/13/2013  . Obesity, unspecified 06/13/2013    Past Surgical History:  Procedure Laterality Date  . BUNIONECTOMY  2007   LEFT FOOT  . LEFT SHOULDER SURGERY  1997   ROTATOR CUFF REPAIR  . SHOULDER ARTHROSCOPY  09/20/2011   Procedure: ARTHROSCOPY SHOULDER;  Surgeon: Johnn Hai, MD;  Location: [facility];  Service: Orthopedics;  Laterality: Left;  SAD AND MINI OPEN ROTATOR CUFF REPAIR    . TOTAL KNEE ARTHROPLASTY  09-12-2006  [facility]   RIGHT KNEE  . TUBAL LIGATION  1976  . TYMPANOPLASTY  1990;   1980  . VAGINAL HYSTERECTOMY  1987    OB History    Gravida Para Term Preterm AB Living   6 4 4   2 4    SAB TAB Ectopic Multiple Live Births     2             Home Medications    Prior to Admission medications   Medication Sig Start Date End Date Taking? Authorizing Provider  albuterol (PROVENTIL HFA;VENTOLIN HFA) 108 (90 Base) MCG/ACT inhaler Inhale 2 puffs into the lungs every 6 (six) hours as needed. For shortness of breath. 07/16/16  Copland, Gay Filler, MD  albuterol (PROVENTIL) (2.5 MG/3ML) 0.083% nebulizer solution Take 3 mLs (2.5 mg total) by nebulization every 6 (six) hours as needed for wheezing or shortness of breath. 07/16/16   Copland, Gay Filler, MD  aspirin EC 325 MG tablet Take 1 tablet (325 mg total) by mouth daily. 07/16/16   Copland, Gay Filler, MD  cetirizine (ZYRTEC) 10 MG tablet Take 1 tablet (10 mg total) by mouth daily. 07/16/16   Copland, Gay Filler, MD  clopidogrel (PLAVIX) 75 MG tablet Take 1 tablet (75 mg total) by mouth daily. 07/16/16   Copland, Gay Filler, MD  docusate sodium (COLACE) 100 MG capsule  Take 1 capsule (100 mg total) by mouth daily. 07/16/16   Copland, Gay Filler, MD  empagliflozin (JARDIANCE) 10 MG TABS tablet Take 5 mg by mouth daily. 07/16/16   Copland, Gay Filler, MD  famotidine (PEPCID) 10 MG tablet Take 10 mg by mouth daily.    [provider]  furosemide (LASIX) 20 MG tablet Take 1 tab daily. 07/16/16   Copland, Gay Filler, MD  gabapentin (NEURONTIN) 300 MG capsule Take 2 capsules (600 mg total) by mouth 2 (two) times daily. 07/16/16   Copland, Gay Filler, MD  HYDROcodone-acetaminophen (NORCO/VICODIN) 5-325 MG tablet Take 1 tablet by mouth every 4 (four) hours as needed. 06/29/15   Isla Pence, MD  ipratropium (ATROVENT) 0.03 % nasal spray Place 2 sprays into the nose 4 (four) times daily. 07/16/16   Copland, Gay Filler, MD  linagliptin (TRADJENTA) 5 MG TABS tablet Take 0.5 tablets (2.5 mg total) by mouth daily. 07/16/16   Copland, Gay Filler, MD  lisinopril (PRINIVIL,ZESTRIL) 20 MG tablet Take 1 tablet (20 mg total) by mouth daily. 07/16/16   Copland, Gay Filler, MD  LORazepam (ATIVAN) 1 MG tablet Take 1 tablet (1 mg total) by mouth 3 (three) times daily as needed for anxiety. 06/29/15   Isla Pence, MD  metFORMIN (GLUCOPHAGE) 500 MG tablet Take 2 tablets (1,000 mg total) by mouth 2 (two) times daily. TAKE 2 TABLETS(1000 MG) BY MOUTH TWICE DAILY WITH A MEAL 07/16/16   Copland, Gay Filler, MD  metoprolol tartrate (LOPRESSOR) 25 MG tablet Take 1 tablet (25 mg total) by mouth 2 (two) times daily. 07/16/16   Copland, Gay Filler, MD  mometasone [facility]) 220 MCG/INH inhaler Use 1 puff daily followed by rinsing your mouth out with water. 06/24/14   Darlyne Russian, MD  QUEtiapine (SEROQUEL) 100 MG tablet Take 0.5 tablets (50 mg total) by mouth at bedtime. 07/16/16   Copland, Gay Filler, MD  sertraline (ZOLOFT) 50 MG tablet Take 0.5 tablets (25 mg total) by mouth at bedtime. 07/16/16   Copland, Gay Filler, MD    Family History Family History  Problem Relation Age of Onset  . Heart disease Mother          in her 58s, heart attack  . Diabetes Mother   . Kidney disease Mother        dialysis  . Thyroid disease Mother   . Aneurysm Mother   . Ovarian cancer Mother   . Breast cancer Mother   . Thyroid disease Sister   . Heart disease Sister   . Lymphoma Father   . Diabetes Father   . Hypertension Father   . Congestive Heart Failure Father   . Thyroid disease Brother   . Thyroid disease Paternal Grandmother   . Heart attack Sister        at age 72  . Stroke  Sister   . Hypertension Sister   . Breast cancer Maternal Aunt   . Breast cancer Maternal Grandmother   . Breast cancer Maternal Aunt   . Breast cancer Maternal Aunt   . Other Brother        Musician accident  . Bipolar disorder Son   . Other Son        GSW    Social History Social History  Substance Use Topics  . Smoking status: Never Smoker  . Smokeless tobacco: Never Used  . Alcohol use No     Allergies   Influenza vaccines; Codeine; Demerol; Nsaids; Prednisone; and Tramadol   Review of Systems Review of Systems  Constitutional: Positive for chills and fever.  HENT: Negative.   Respiratory: Positive for cough and shortness of breath.   Cardiovascular: Positive for chest pain.  Gastrointestinal: Positive for vomiting.  Musculoskeletal: Negative.   Skin: Negative.   Allergic/Immunologic: Positive for immunocompromised state.       Diabetic  Neurological: Negative.   Psychiatric/Behavioral: Negative.   All other systems reviewed and are negative.    Physical Exam Updated Vital Signs BP 126/64 (BP Location: Right Arm)   Pulse (!) 116   Temp (!) 101.4 F (38.6 C) (Oral)   Resp 16   Ht 5\' 1"  (1.549 m)   Wt 86.2 kg (190 lb)   SpO2 98%   BMI 35.90 kg/m   Physical Exam  Constitutional: She appears well-developed and well-nourished. She appears distressed.  Moderately ill-appearing  HENT:  Head: Normocephalic and atraumatic.  Eyes: Pupils are equal, round, and reactive to light. Conjunctivae are  normal.  Neck: Neck supple. No tracheal deviation present. No thyromegaly present.  Cardiovascular: Regular rhythm.   No murmur heard. Tachycardic  Pulmonary/Chest:  Diffuse rhonchi  Abdominal: Soft. She exhibits no distension. There is no tenderness.  Musculoskeletal: Normal range of motion. She exhibits no edema or tenderness.  Neurological: She is alert. Coordination normal.  Skin: Skin is warm and dry. Capillary refill takes less than 2 seconds. No rash noted.  Psychiatric: She has a normal mood and affect.  Nursing note and vitals reviewed.    ED Treatments / Results  Labs (all labs ordered are listed, but only abnormal results are displayed) Labs Reviewed  BASIC METABOLIC PANEL - Abnormal; Notable for the following:       Result Value   Potassium 3.2 (*)    Glucose, Bld 120 (*)    All other components within normal limits  CBC  I-STAT TROPOININ, ED   Results for orders placed or performed during the hospital encounter of 32/67/12  Basic metabolic panel  Result Value Ref Range   Sodium 140 135 - 145 mmol/L   Potassium 3.2 (L) 3.5 - 5.1 mmol/L   Chloride 104 101 - 111 mmol/L   CO2 25 22 - 32 mmol/L   Glucose, Bld 120 (H) 65 - 99 mg/dL   BUN 18 6 - 20 mg/dL   Creatinine, Ser 0.76 0.44 - 1.00 mg/dL   Calcium 9.1 8.9 - 10.3 mg/dL   GFR calc non Af Amer >60 >60 mL/min   GFR calc Af Amer >60 >60 mL/min   Anion gap 11 5 - 15  CBC  Result Value Ref Range   WBC 7.4 4.0 - 10.5 K/uL   RBC 4.98 3.87 - 5.11 MIL/uL   Hemoglobin 14.1 12.0 - 15.0 g/dL   HCT 45.0 36.0 - 46.0 %   MCV 90.4 78.0 - 100.0 fL  MCH 28.3 26.0 - 34.0 pg   MCHC 31.3 30.0 - 36.0 g/dL   RDW 13.9 11.5 - 15.5 %   Platelets 233 150 - 400 K/uL  I-stat troponin, ED  Result Value Ref Range   Troponin i, poc 0.00 0.00 - 0.08 ng/mL   Comment 3          I-Stat CG4 Lactic Acid, ED  (not at  [facility])  Result Value Ref Range   Lactic Acid, Venous 1.34 0.5 - 1.9 mmol/L   Dg Chest 2 View  Result Date:  08/19/2016 CLINICAL DATA:  Productive cough with fever. EXAM: CHEST  2 VIEW COMPARISON:  06/23/2014. FINDINGS: Low volume film with bronchial wall thickening and diffuse interstitial opacity. No dense focal airspace consolidation. No pleural effusion. Cardiopericardial silhouette is at upper limits of normal for size. The visualized bony structures of the thorax are intact. IMPRESSION: Diffuse bronchial wall thickening compatible with reactive airways disease or viral bronchiolitis. No evidence for focal pneumonia or overt pulmonary edema. Electronically Signed   By: Misty Stanley M.D.   On: 08/19/2016 18:57   EKG  EKG Interpretation  Date/Time:  Thursday August 19 2016 18:31:21 EDT Ventricular Rate:  115 PR Interval:  142 QRS Duration: 82 QT Interval:  322 QTC Calculation: 445 R Axis:   98 Text Interpretation:  Sinus tachycardia Rightward axis T wave abnormality, consider inferior ischemia T wave abnormality, consider anterolateral ischemia Abnormal ECG Confirmed by Orlie Dakin [phone]) on 08/19/2016 9:46:17 PM       Radiology Dg Chest 2 View  Result Date: 08/19/2016 CLINICAL DATA:  Productive cough with fever. EXAM: CHEST  2 VIEW COMPARISON:  06/23/2014. FINDINGS: Low volume film with bronchial wall thickening and diffuse interstitial opacity. No dense focal airspace consolidation. No pleural effusion. Cardiopericardial silhouette is at upper limits of normal for size. The visualized bony structures of the thorax are intact. IMPRESSION: Diffuse bronchial wall thickening compatible with reactive airways disease or viral bronchiolitis. No evidence for focal pneumonia or overt pulmonary edema. Electronically Signed   By: Misty Stanley M.D.   On: 08/19/2016 18:57   Chest x-ray viewed by me Procedures Procedures (including critical care time)  Medications Ordered in ED Medications  albuterol (PROVENTIL) (2.5 MG/3ML) 0.083% nebulizer solution (not administered)  albuterol (PROVENTIL) (2.5  MG/3ML) 0.083% nebulizer solution 5 mg (5 mg Nebulization Given 08/19/16 1811)     Initial Impression / Assessment and Plan / ED Course  I have reviewed the triage vital signs and the nursing notes.  Pertinent labs & imaging results that were available during my care of the patient were reviewed by me and considered in my medical decision making (see chart for details).     Code sepsis called based on Sirs criteria of fever, tachypnea, tachycardia. Source of infection respiratory Hospitalist service consulted who will arrange for overnight stay   Sepsis - Repeat Assessment  Performed at:    1030 pm  Vitals     Blood pressure 111/66, pulse 96, temperature (!) 101.4 F (38.6 C), temperature source Oral, resp. rate 20, height 5\' 1"  (1.549 m), weight 86.2 kg (190 lb), SpO2 96 %.  Heart:     Regular rate and rhythm  Lungs:    Rhonchi  Capillary Refill:   <2 sec  Peripheral Pulse:   Radial pulse palpable  Skin:     Normal Color 10:30 PM feels improved after treatment with intravenous antibiotics, oral acetaminophen or an oral potassium supplementation Hospitalist service consulted to arrange  for overnight stay Final Clinical Impressions(s) / ED Diagnoses  Dx #1 pneumonitis #2 hypokalemia Final diagnoses:  None    New Prescriptions New Prescriptions   No medications on file     Orlie Dakin, MD 08/19/16 2240

## 2016-08-19 NOTE — Progress Notes (Signed)
Pharmacy Antibiotic Note  Sherry [name] is a 66 y.o. female with concern of PNA.  Pharmacy has been consulted for rocephin and azithromycin  Dosing. -WBC= 7.4, afebrile, cultures ordered -one time doses have been ordered in the Ed  Plan: -Rocephin 1gm IV q24h -Azithromycin 500mg  IV q24hr -Will sign off. Please contact pharmacy with any other needs.  Thank you, Hildred Laser, Pharm D 08/19/2016 10:11 PM    Height: 5\' 1"  (154.9 cm) Weight: 190 lb (86.2 kg) IBW/kg (Calculated) : 47.8  Temp (24hrs), Avg:101.4 F (38.6 C), Min:101.4 F (38.6 C), Max:101.4 F (38.6 C)   Recent Labs Lab 08/19/16 1927  WBC 7.4  CREATININE 0.76    Estimated Creatinine Clearance: 69.9 mL/min (by C-G formula based on SCr of 0.76 mg/dL).    Allergies  Allergen Reactions  . Influenza Vaccines Anaphylaxis  . Codeine Itching  . Demerol Other (See Comments)    SEIZURE  . Nsaids Other (See Comments)    NAPROXEN, IBUPROFEN, SKELAXIN---  CAUSES PALPITATIONS  . Prednisone     Caused her glucose to go up to 500 and went to ED  . Tramadol     Contraindicated with Victoza

## 2016-08-19 NOTE — H&P (Addendum)
History and Physical    Sherry [name] BPZ:[id number] DOB: 1950/03/26 DOA: 08/19/2016  PCP: Darreld Mclean, MD  Patient coming from: home  Chief Complaint: sob  HPI: Sherry [name] is a 66 y.o. female with medical history significant of sob, coughing.  Present for 6 days, progressive, increase cough, phlegm, sob.  Used her inhaler and  Nebulizer more often than usual.  No sick contacts, but thinks her grandson might be coming down with something.  + n/v after a very bad coughing spell, NBNB.  No chest pain, palpitations, hemoptysis, fever, chills, abdominal pain, dysuria, hematuria, polyuria, nocturia, BRBPR, melena, hematochezia, heat/cold intolerance, orthopnea, PND, leg swelling  ED Course: given Roceph, Azithro, Potassium and referred for admission  Review of Systems: As per HPI otherwise 10 point review of systems negative.   Past Medical History:  Diagnosis Date  . Cerebral aneurysm   . Complication of anesthesia HYPOTENSION INTRAOP W/ HYSTERECTOMY IN 1987--  DID OK W/ TOTAL KNEE IN 2008  . Diabetes mellitus   . DJD (degenerative joint disease)   . Dyslipidemia   . GERD (gastroesophageal reflux disease)   . H/O hiatal hernia   . History of CHF (congestive heart failure) 2010-  RESOLVED  . History of peptic ulcer YRS AGO  . Hypertension   . Meniere's disease   . Mitral valve prolapse occasional palpitations w/ chest discomfort  . OA (osteoarthritis)   . Rotator cuff tear, left   . Stroke [facility])     Past Surgical History:  Procedure Laterality Date  . BUNIONECTOMY  2007   LEFT FOOT  . LEFT SHOULDER SURGERY  1997   ROTATOR CUFF REPAIR  . SHOULDER ARTHROSCOPY  09/20/2011   Procedure: ARTHROSCOPY SHOULDER;  Surgeon: Johnn Hai, MD;  Location: [facility];  Service: Orthopedics;  Laterality: Left;  SAD AND MINI OPEN ROTATOR CUFF REPAIR    . TOTAL KNEE ARTHROPLASTY  09-12-2006  [facility]   RIGHT KNEE  . TUBAL LIGATION  1976  . TYMPANOPLASTY  1990;    1980  . VAGINAL HYSTERECTOMY  1987     reports that she has never smoked. She has never used smokeless tobacco. She reports that she does not drink alcohol or use drugs.  Allergies  Allergen Reactions  . Influenza Vaccines Anaphylaxis  . Codeine Itching  . Demerol Other (See Comments)    SEIZURE  . Nsaids Other (See Comments)    NAPROXEN, IBUPROFEN, SKELAXIN---  CAUSES PALPITATIONS  . Prednisone     Caused her glucose to go up to 500 and went to ED  . Tramadol     Contraindicated with Victoza     Family History  Problem Relation Age of Onset  . Heart disease Mother        in her 62s, heart attack  . Diabetes Mother   . Kidney disease Mother        dialysis  . Thyroid disease Mother   . Aneurysm Mother   . Ovarian cancer Mother   . Breast cancer Mother   . Thyroid disease Sister   . Heart disease Sister   . Lymphoma Father   . Diabetes Father   . Hypertension Father   . Congestive Heart Failure Father   . Thyroid disease Brother   . Thyroid disease Paternal Grandmother   . Heart attack Sister        at age 67  . Stroke Sister   . Hypertension Sister   . Breast cancer Maternal Aunt   .  Breast cancer Maternal Grandmother   . Breast cancer Maternal Aunt   . Breast cancer Maternal Aunt   . Other Brother        Musician accident  . Bipolar disorder Son   . Other Son        GSW   Family hx reviewed  Prior to Admission medications   Medication Sig Start Date End Date Taking? Authorizing Provider  albuterol (PROVENTIL HFA;VENTOLIN HFA) 108 (90 Base) MCG/ACT inhaler Inhale 2 puffs into the lungs every 6 (six) hours as needed. For shortness of breath. 07/16/16  Yes Copland, Gay Filler, MD  albuterol (PROVENTIL) (2.5 MG/3ML) 0.083% nebulizer solution Take 3 mLs (2.5 mg total) by nebulization every 6 (six) hours as needed for wheezing or shortness of breath. 07/16/16  Yes Copland, Gay Filler, MD  aspirin EC 325 MG tablet Take 1 tablet (325 mg total) by mouth daily. 07/16/16  Yes  Copland, Gay Filler, MD  cetirizine (ZYRTEC) 10 MG tablet Take 1 tablet (10 mg total) by mouth daily. 07/16/16  Yes Copland, Gay Filler, MD  clopidogrel (PLAVIX) 75 MG tablet Take 1 tablet (75 mg total) by mouth daily. 07/16/16  Yes Copland, Gay Filler, MD  empagliflozin (JARDIANCE) 10 MG TABS tablet Take 5 mg by mouth daily. 07/16/16  Yes Copland, Gay Filler, MD  famotidine (PEPCID) 10 MG tablet Take 10 mg by mouth daily.   Yes [provider]  furosemide (LASIX) 20 MG tablet Take 1 tab daily. Patient taking differently: Take 20 mg by mouth daily.  07/16/16  Yes Copland, Gay Filler, MD  gabapentin (NEURONTIN) 300 MG capsule Take 2 capsules (600 mg total) by mouth 2 (two) times daily. 07/16/16  Yes Copland, Gay Filler, MD  ipratropium (ATROVENT) 0.03 % nasal spray Place 2 sprays into the nose 4 (four) times daily. 07/16/16  Yes Copland, Gay Filler, MD  linagliptin (TRADJENTA) 5 MG TABS tablet Take 0.5 tablets (2.5 mg total) by mouth daily. 07/16/16  Yes Copland, Gay Filler, MD  lisinopril (PRINIVIL,ZESTRIL) 20 MG tablet Take 1 tablet (20 mg total) by mouth daily. 07/16/16  Yes Copland, Gay Filler, MD  metFORMIN (GLUCOPHAGE) 500 MG tablet Take 2 tablets (1,000 mg total) by mouth 2 (two) times daily. TAKE 2 TABLETS(1000 MG) BY MOUTH TWICE DAILY WITH A MEAL 07/16/16  Yes Copland, Gay Filler, MD  metoprolol tartrate (LOPRESSOR) 25 MG tablet Take 1 tablet (25 mg total) by mouth 2 (two) times daily. 07/16/16  Yes Copland, Gay Filler, MD  mometasone [facility]) 220 MCG/INH inhaler Use 1 puff daily followed by rinsing your mouth out with water. Patient taking differently: Inhale 1 puff into the lungs daily. \ 06/24/14  Yes Darlyne Russian, MD  QUEtiapine (SEROQUEL) 100 MG tablet Take 0.5 tablets (50 mg total) by mouth at bedtime. 07/16/16  Yes Copland, Gay Filler, MD  sertraline (ZOLOFT) 50 MG tablet Take 0.5 tablets (25 mg total) by mouth at bedtime. 07/16/16  Yes Copland, Gay Filler, MD  docusate sodium (COLACE) 100 MG capsule Take 1  capsule (100 mg total) by mouth daily. Patient not taking: Reported on 08/19/2016 07/16/16   Copland, Gay Filler, MD  HYDROcodone-acetaminophen (NORCO/VICODIN) 5-325 MG tablet Take 1 tablet by mouth every 4 (four) hours as needed. Patient not taking: Reported on 08/19/2016 06/29/15   Isla Pence, MD  LORazepam (ATIVAN) 1 MG tablet Take 1 tablet (1 mg total) by mouth 3 (three) times daily as needed for anxiety. Patient not taking: Reported on 08/19/2016 06/29/15   Gilford Raid,  Almyra Free, MD    Physical Exam: Vitals:   08/19/16 1806 08/19/16 1807 08/19/16 2029 08/19/16 2200  BP: 128/81  126/64 111/66  Pulse: (!) 105  (!) 116 96  Resp: (!) 24  16 20   Temp: (!) 101.4 F (38.6 C)     TempSrc: Oral     SpO2: 98%  98% 96%  Weight:  86.2 kg (190 lb)    Height:  5\' 1"  (1.549 m)        Constitutional: NAD, calm, comfortable Vitals:   08/19/16 1806 08/19/16 1807 08/19/16 2029 08/19/16 2200  BP: 128/81  126/64 111/66  Pulse: (!) 105  (!) 116 96  Resp: (!) 24  16 20   Temp: (!) 101.4 F (38.6 C)     TempSrc: Oral     SpO2: 98%  98% 96%  Weight:  86.2 kg (190 lb)    Height:  5\' 1"  (1.549 m)     Gen NAD, resting, mildly obese Eyes: PERRL, lids and conjunctivae normal, anicteric ENMT: Mucous membranes are moist. Posterior pharynx clear of any exudate or lesions.Normal dentition.  Neck: normal, supple, no masses, no thyromegaly Respiratory: + wheezing scattered, decreased air movement, no crackles. Normal respiratory effort. No accessory muscle use.  Cardiovascular: Regular rate and rhythm, no murmurs / rubs / gallops. No extremity edema. 2+ pedal pulses. No carotid bruits.  Abdomen: no tenderness, no masses palpated. No hepatosplenomegaly. Bowel sounds positive.  Musculoskeletal: no clubbing / cyanosis. No joint deformity upper and lower extremities. Good ROM, no contractures. Normal muscle tone.  Skin: no rashes, lesions, ulcers. No induration Neurologic: CN 2-12 grossly intact. Sensation intact,  DTR normal. Strength 5/5 in all 4.  Psychiatric: Normal judgment and insight. Alert and oriented x 3. Normal mood.  Capillary refill less than 2 seconds   Labs on Admission: I have personally reviewed following labs and imaging studies  CBC:  Recent Labs Lab 08/19/16 1927  WBC 7.4  HGB 14.1  HCT 45.0  MCV 90.4  PLT 366   Basic Metabolic Panel:  Recent Labs Lab 08/19/16 1927  NA 140  K 3.2*  CL 104  CO2 25  GLUCOSE 120*  BUN 18  CREATININE 0.76  CALCIUM 9.1   GFR: Estimated Creatinine Clearance: 69.9 mL/min (by C-G formula based on SCr of 0.76 mg/dL). Liver Function Tests: No results for input(s): AST, ALT, ALKPHOS, BILITOT, PROT, ALBUMIN in the last 168 hours. No results for input(s): LIPASE, AMYLASE in the last 168 hours. No results for input(s): AMMONIA in the last 168 hours. Coagulation Profile: No results for input(s): INR, PROTIME in the last 168 hours. Cardiac Enzymes: No results for input(s): CKTOTAL, CKMB, CKMBINDEX, TROPONINI in the last 168 hours. BNP (last 3 results) No results for input(s): PROBNP in the last 8760 hours. HbA1C: No results for input(s): HGBA1C in the last 72 hours. CBG: No results for input(s): GLUCAP in the last 168 hours. Lipid Profile: No results for input(s): CHOL, HDL, LDLCALC, TRIG, CHOLHDL, LDLDIRECT in the last 72 hours. Thyroid Function Tests: No results for input(s): TSH, T4TOTAL, FREET4, T3FREE, THYROIDAB in the last 72 hours. Anemia Panel: No results for input(s): VITAMINB12, FOLATE, FERRITIN, TIBC, IRON, RETICCTPCT in the last 72 hours. Urine analysis:    Component Value Date/Time   COLORURINE YELLOW 04/14/2015 1225   APPEARANCEUR CLEAR 04/14/2015 1225   LABSPEC 1.015 04/14/2015 1225   PHURINE 5.5 04/14/2015 1225   GLUCOSEU >1000 (A) 04/14/2015 1225   HGBUR NEGATIVE 04/14/2015 1225   BILIRUBINUR NEGATIVE  04/14/2015 1225   KETONESUR NEGATIVE 04/14/2015 1225   PROTEINUR NEGATIVE 04/14/2015 1225   UROBILINOGEN 0.2  02/14/2014 2108   NITRITE NEGATIVE 04/14/2015 1225   LEUKOCYTESUR NEGATIVE 04/14/2015 1225    Sepsis Labs: lactic negative 1.34  Radiological Exams on Admission: Dg Chest 2 View  Result Date: 08/19/2016 CLINICAL DATA:  Productive cough with fever. EXAM: CHEST  2 VIEW COMPARISON:  06/23/2014. FINDINGS: Low volume film with bronchial wall thickening and diffuse interstitial opacity. No dense focal airspace consolidation. No pleural effusion. Cardiopericardial silhouette is at upper limits of normal for size. The visualized bony structures of the thorax are intact. IMPRESSION: Diffuse bronchial wall thickening compatible with reactive airways disease or viral bronchiolitis. No evidence for focal pneumonia or overt pulmonary edema. Electronically Signed   By: Misty Stanley M.D.   On: 08/19/2016 18:57    EKG: by my independent review. Sinus tachy 115, RAD, nonspecific TWI in II, III, aVF, V3-6, compared to prior EKGs noted to have some similarity.  No QW or ST elevations or depressions.  Assessment/Plan Active Problems:   Acute asthma exacerbation    Acute Asthma Exacerbation: iv solumedrol, nebs, incentive spiro, monitor, peak flow.  Possible Bronchitis vs CAP: continue with roceph and azithro.  Could be viral as well, given fever and sepsis fever best to continue for now.  HR could be related to neb use.  Await for further tests.  Nonspecific EKG findings: no chest pain or related symptoms.  Repeat EKG in AM.  If symptomatic will obtain EKG and trop.  Will monitor  DM: SSI coverage, ck labs, monitor  HTN: continue meds, monitor, optimize care and Rx  CVA hx: stable continue meds, monitor, ck lipids, not on statin  Anxiety: stable, continue meds, monitor  CHF: stable, not active, monitor  DVT prophylaxis: LMWH  Code Status: Full  Family Communication: none present  Disposition Plan: home  Consults called: none  Admission status: Admit    Carmon Ginsberg MD [facility] Pager [phone]  If 7PM-7AM, please contact night-coverage [email or web address] Password [email or web address]  08/19/2016, 11:40 PM

## 2016-08-19 NOTE — ED Triage Notes (Signed)
Per Pt, Pt is coming from home with complaints of wheezing, SOB, and productive cough for five days. Pt has hx of asthma. Pt reports no relief with inhalers and neb treatments.

## 2016-08-20 ENCOUNTER — Other Ambulatory Visit: Payer: Self-pay | Admitting: Cardiology

## 2016-08-20 ENCOUNTER — Encounter (HOSPITAL_COMMUNITY): Payer: Self-pay | Admitting: Cardiology

## 2016-08-20 DIAGNOSIS — J45901 Unspecified asthma with (acute) exacerbation: Principal | ICD-10-CM

## 2016-08-20 DIAGNOSIS — R079 Chest pain, unspecified: Secondary | ICD-10-CM

## 2016-08-20 DIAGNOSIS — R9431 Abnormal electrocardiogram [ECG] [EKG]: Secondary | ICD-10-CM

## 2016-08-20 DIAGNOSIS — I10 Essential (primary) hypertension: Secondary | ICD-10-CM

## 2016-08-20 DIAGNOSIS — E119 Type 2 diabetes mellitus without complications: Secondary | ICD-10-CM

## 2016-08-20 DIAGNOSIS — J4541 Moderate persistent asthma with (acute) exacerbation: Secondary | ICD-10-CM

## 2016-08-20 LAB — COMPREHENSIVE METABOLIC PANEL
ALT: 11 U/L — ABNORMAL LOW (ref 14–54)
AST: 16 U/L (ref 15–41)
Albumin: 3.4 g/dL — ABNORMAL LOW (ref 3.5–5.0)
Alkaline Phosphatase: 61 U/L (ref 38–126)
Anion gap: 6 (ref 5–15)
BUN: 15 mg/dL (ref 6–20)
CO2: 25 mmol/L (ref 22–32)
Calcium: 8.5 mg/dL — ABNORMAL LOW (ref 8.9–10.3)
Chloride: 108 mmol/L (ref 101–111)
Creatinine, Ser: 0.61 mg/dL (ref 0.44–1.00)
GFR calc Af Amer: >60 mL/min (ref >60)
GFR calc non Af Amer: >60 mL/min (ref >60)
Glucose, Bld: 154 mg/dL — ABNORMAL HIGH (ref 65–99)
Potassium: 3.7 mmol/L (ref 3.5–5.1)
Sodium: 139 mmol/L (ref 135–145)
Total Bilirubin: 0.4 mg/dL (ref 0.3–1.2)
Total Protein: 5.9 g/dL — ABNORMAL LOW (ref 6.5–8.1)

## 2016-08-20 LAB — URINALYSIS, COMPLETE (UACMP) WITH MICROSCOPIC
Bacteria, UA: NONE SEEN
Bilirubin Urine: NEGATIVE
Glucose, UA: >=500 mg/dL — AB
Hgb urine dipstick: NEGATIVE
Ketones, ur: 5 mg/dL — AB
Nitrite: NEGATIVE
Protein, ur: NEGATIVE mg/dL
Specific Gravity, Urine: 1.024 (ref 1.005–1.030)
pH: 5 (ref 5.0–8.0)

## 2016-08-20 LAB — GLUCOSE, CAPILLARY
Glucose-Capillary: 177 mg/dL — ABNORMAL HIGH (ref 65–99)
Glucose-Capillary: 236 mg/dL — ABNORMAL HIGH (ref 65–99)
Glucose-Capillary: 220 mg/dL — ABNORMAL HIGH (ref 65–99)
Glucose-Capillary: 254 mg/dL — ABNORMAL HIGH (ref 65–99)

## 2016-08-20 LAB — CBC
HCT: 42.5 % (ref 36.0–46.0)
Hemoglobin: 13.1 g/dL (ref 12.0–15.0)
MCH: 28.3 pg (ref 26.0–34.0)
MCHC: 30.8 g/dL (ref 30.0–36.0)
MCV: 91.8 fL (ref 78.0–100.0)
Platelets: 225 10*3/uL (ref 150–400)
RBC: 4.63 MIL/uL (ref 3.87–5.11)
RDW: 14.3 % (ref 11.5–15.5)
WBC: 4.8 10*3/uL (ref 4.0–10.5)

## 2016-08-20 LAB — LIPID PANEL
Cholesterol: 114 mg/dL (ref 0–200)
HDL: 51 mg/dL (ref >40)
LDL Cholesterol: 51 mg/dL (ref 0–99)
Total CHOL/HDL Ratio: 2.2 RATIO
Triglycerides: 58 mg/dL (ref <150)
VLDL: 12 mg/dL (ref 0–40)

## 2016-08-20 LAB — STREP PNEUMONIAE URINARY ANTIGEN: Strep Pneumo Urinary Antigen: NEGATIVE

## 2016-08-20 LAB — TROPONIN I: Troponin I: <0.03 ng/mL (ref <0.03)

## 2016-08-20 LAB — MAGNESIUM: Magnesium: 1.8 mg/dL (ref 1.7–2.4)

## 2016-08-20 MED ORDER — IPRATROPIUM-ALBUTEROL 0.5-2.5 (3) MG/3ML IN SOLN: 3.0000 mL | Freq: Two times a day (BID) | RESPIRATORY_TRACT | Status: DC

## 2016-08-20 MED ORDER — IPRATROPIUM-ALBUTEROL 0.5-2.5 (3) MG/3ML IN SOLN
3.0000 mL | Freq: Three times a day (TID) | RESPIRATORY_TRACT | Status: DC
Start: 2016-08-20 — End: 2016-08-21
  Administered 2016-08-20 – 2016-08-21 (×3): 3 mL via RESPIRATORY_TRACT
  Filled 2016-08-20 (×3): qty 3

## 2016-08-20 MED ORDER — INSULIN ASPART 100 UNIT/ML ~~LOC~~ SOLN
4.0000 [IU] | Freq: Once | SUBCUTANEOUS | Status: AC
  Administered 2016-08-20: 4 [IU] via SUBCUTANEOUS

## 2016-08-20 MED ORDER — IPRATROPIUM-ALBUTEROL 0.5-2.5 (3) MG/3ML IN SOLN
3.0000 mL | Freq: Four times a day (QID) | RESPIRATORY_TRACT | Status: DC
Start: 2016-08-20 — End: 2016-08-20

## 2016-08-20 MED ORDER — INSULIN ASPART 100 UNIT/ML ~~LOC~~ SOLN
0.0000 [IU] | Freq: Three times a day (TID) | SUBCUTANEOUS | Status: DC
  Administered 2016-08-20 (×2): 3 [IU] via SUBCUTANEOUS
  Administered 2016-08-21: 5 [IU] via SUBCUTANEOUS
  Administered 2016-08-21: 2 [IU] via SUBCUTANEOUS
  Administered 2016-08-21: 5 [IU] via SUBCUTANEOUS

## 2016-08-20 NOTE — Progress Notes (Unsigned)
yoview

## 2016-08-20 NOTE — Discharge Instructions (Signed)
We scheduled you for outpt stress test, not where you walk but on stretcher.  The office will call you next week to schedule.  This is to make sure you are safe for surgery with this recent episode.  Nothing to eat or drink 3 hours before study.  No caffeine for 24 hours before and day of.   Do not wear perfumes or strong deodorant.

## 2016-08-20 NOTE — Progress Notes (Signed)
Nutrition Brief Note  Patient identified on the Malnutrition Screening Tool (MST) Report  Wt Readings from Last 15 Encounters:  08/19/16 190 lb (86.2 kg)  08/16/16 190 lb (86.2 kg)  05/19/16 195 lb (88.5 kg)  04/23/16 190 lb (86.2 kg)  03/23/16 189 lb (85.7 kg)  03/18/16 189 lb 12.8 oz (86.1 kg)  03/05/16 190 lb (86.2 kg)  12/30/15 189 lb (85.7 kg)  12/30/15 190 lb (86.2 kg)  12/18/15 189 lb 12.8 oz (86.1 kg)  07/17/15 193 lb 3.2 oz (87.6 kg)  05/26/15 198 lb 6.4 oz (90 kg)  04/14/15 197 lb 5 oz (89.5 kg)  02/18/15 206 lb 6.4 oz (93.6 kg)  01/22/15 206 lb 12.8 oz (93.8 kg)   Sherry [name] is a 66 y.o. female with medical history significant of sob, coughing.  Present for 6 days, progressive, increase cough, phlegm, sob.   Pt reports a 40# wt loss over the past year, however, not consistent with wt hx. Pt shares well controlled DM, however, was put on glucoretics, which contributed to her weight loss.   Pt reports appetite is returning. She is compliant with DM diet at home.   Nutrition-Focused physical exam completed. Findings are no fat depletion, no muscle depletion, and no edema.   Pt denies any questions or concerns related to DM or nutrition.   Body mass index is 35.9 kg/m. Patient meets criteria for obesity, class II based on current BMI.   Current diet order is Heart Healthy/Carb Modified, patient is consuming approximately 100% of meals at this time. Labs and medications reviewed.   No nutrition interventions warranted at this time. If nutrition issues arise, please consult RD.   Eddrick Dilone A. Jimmye Norman, RD, LDN, CDE Pager: [phone] After hours Pager: [phone]

## 2016-08-20 NOTE — Progress Notes (Signed)
[facility]  PROGRESS NOTE  Sherry [name] HYI:[id number] DOB: 1950-06-27 DOA: 08/19/2016 PCP: Darreld Mclean, MD   Brief HPI:   66 y.o. female with medical history significant of sob, coughing.  Present for 6 days, progressive, increase cough, phlegm, sob.  Used her inhaler and  Nebulizer more often than usual.  No sick contacts, but thinks her grandson might be coming down with something.  + n/v after a very bad coughing spell, NBNB.  No chest pain, palpitations, hemoptysis, fever, chills, abdominal pain, dysuria, hematuria, polyuria, nocturia, BRBPR, melena, hematochezia, heat/cold intolerance, orthopnea, PND, leg swelling     Subjective   Patient seen and examined, breathing is improved. Still coughing. Not requiring oxygen though. EKG done yesterday shows diffuse T-wave inversions in multiple leads which is new from previous EKG. Patient was recently seen by cardiology on 08/16/2016 for preop clearance.   Assessment/Plan:     1. Acute asthma exacerbation-improved, continue IV Solu Medrol 60 mg  every 6 hours, DuoNeb nebulizers every 6 hours. 2. Abnormal EKG-patient has T-wave inversions in multiple leads which is new from previous EKG from 718 when she was seen by cardiology for preop clearance. We'll consult cardiology for likely cardiac stress test. 3. Diabetes mellitus-continue sliding scale insulin with NovoLog. 4. Hypertension- continue metoprolol, lisinopril 5. History of CVA-stable, continue Plavix, aspirin. 6. Anxiety-continue sertraline    DVT prophylaxis: Lovenox  Code Status: Full code  Family Communication: No family at bedside   Disposition Plan: Likely home in 1-2 days   Consultants:  None  Procedures:  None  Continuous infusions . azithromycin    . cefTRIAXone (ROCEPHIN)  IV        Antibiotics:   Anti-infectives    Start     Dose/Rate Route Frequency Ordered Stop   08/20/16 2200  cefTRIAXone (ROCEPHIN) 1 g in dextrose 5 % 50 mL  IVPB     1 g 100 mL/hr over 30 Minutes Intravenous Every 24 hours 08/19/16 2209     08/20/16 2200  azithromycin (ZITHROMAX) 500 mg in dextrose 5 % 250 mL IVPB     500 mg 250 mL/hr over 60 Minutes Intravenous Every 24 hours 08/19/16 2209     08/19/16 2200  cefTRIAXone (ROCEPHIN) 1 g in dextrose 5 % 50 mL IVPB     1 g 100 mL/hr over 30 Minutes Intravenous  Once 08/19/16 2158 08/19/16 2251   08/19/16 2200  azithromycin (ZITHROMAX) 500 mg in dextrose 5 % 250 mL IVPB     500 mg 250 mL/hr over 60 Minutes Intravenous  Once 08/19/16 2158 08/19/16 2351       Objective   Vitals:   08/20/16 0058 08/20/16 0215 08/20/16 0630 08/20/16 0903  BP: 108/61  (!) 124/59   Pulse: 77  63   Resp: 15  17   Temp: 98.2 F (36.8 C)  97.7 F (36.5 C)   TempSrc: Oral  Oral   SpO2: 96% 97% 100% 99%  Weight:      Height:        Intake/Output Summary (Last 24 hours) at 08/20/16 1443 Last data filed at 08/20/16 0902  Gross per 24 hour  Intake             1085 ml  Output              775 ml  Net              310 ml   Filed Weights   08/19/16 1807  Weight: 86.2 kg (190 lb)     Physical Examination:   Physical Exam: Eyes: No icterus, extraocular muscles intact  Mouth: Oral mucosa is moist, no lesions on palate,  Neck: Supple, no deformities, masses, or tenderness Lungs: Normal respiratory effort, scattered wheezing bilaterally Heart: Regular rate and rhythm, S1 and S2 normal, no murmurs, rubs auscultated Abdomen: BS normoactive,soft,nondistended,non-tender to palpation,no organomegaly Extremities: No pretibial edema, no erythema, no cyanosis, no clubbing Neuro : Alert and oriented to time, place and person, No focal deficits  Skin: No rashes seen on exam     Data Reviewed: I have personally reviewed following labs and imaging studies  CBG:  Recent Labs Lab 08/20/16 0802 08/20/16 1154  GLUCAP 177* 236*    CBC:  Recent Labs Lab 08/19/16 1927 08/20/16 0436  WBC 7.4 4.8  HGB  14.1 13.1  HCT 45.0 42.5  MCV 90.4 91.8  PLT 233 789    Basic Metabolic Panel:  Recent Labs Lab 08/19/16 1927 08/20/16 0436  NA 140 139  K 3.2* 3.7  CL 104 108  CO2 25 25  GLUCOSE 120* 154*  BUN 18 15  CREATININE 0.76 0.61  CALCIUM 9.1 8.5*  MG 1.8  --     Recent Results (from the past 240 hour(s))  Blood Culture (routine x 2)     Status: None (Preliminary result)   Collection Time: 08/19/16  9:58 PM  Result Value Ref Range Status   Specimen Description BLOOD RIGHT ANTECUBITAL  Final   Special Requests   Final    BOTTLES DRAWN AEROBIC AND ANAEROBIC Blood Culture adequate volume   Culture NO GROWTH < 24 HOURS  Final   Report Status PENDING  Incomplete  Blood Culture (routine x 2)     Status: None (Preliminary result)   Collection Time: 08/19/16 10:03 PM  Result Value Ref Range Status   Specimen Description BLOOD LEFT HAND  Final   Special Requests   Final    BOTTLES DRAWN AEROBIC ONLY Blood Culture adequate volume   Culture NO GROWTH < 24 HOURS  Final   Report Status PENDING  Incomplete     Liver Function Tests:  Recent Labs Lab 08/20/16 0436  AST 16  ALT 11*  ALKPHOS 61  BILITOT 0.4  PROT 5.9*  ALBUMIN 3.4*   No results for input(s): LIPASE, AMYLASE in the last 168 hours. No results for input(s): AMMONIA in the last 168 hours.  Cardiac Enzymes:  Recent Labs Lab 08/20/16 1238  TROPONINI <0.03   BNP (last 3 results) No results for input(s): BNP in the last 8760 hours.  ProBNP (last 3 results) No results for input(s): PROBNP in the last 8760 hours.    Studies: Dg Chest 2 View  Result Date: 08/19/2016 CLINICAL DATA:  Productive cough with fever. EXAM: CHEST  2 VIEW COMPARISON:  06/23/2014. FINDINGS: Low volume film with bronchial wall thickening and diffuse interstitial opacity. No dense focal airspace consolidation. No pleural effusion. Cardiopericardial silhouette is at upper limits of normal for size. The visualized bony structures of the  thorax are intact. IMPRESSION: Diffuse bronchial wall thickening compatible with reactive airways disease or viral bronchiolitis. No evidence for focal pneumonia or overt pulmonary edema. Electronically Signed   By: Misty Stanley M.D.   On: 08/19/2016 18:57    Scheduled Meds: . aspirin EC  325 mg Oral Daily  . budesonide  0.25 mg Inhalation BID  . clopidogrel  75 mg Oral Daily  . enoxaparin (LOVENOX) injection  40 mg  Subcutaneous Q24H  . famotidine  10 mg Oral Daily  . furosemide  20 mg Oral Daily  . gabapentin  600 mg Oral BID  . insulin aspart  0-9 Units Subcutaneous TID WC  . ipratropium  2 spray Nasal QID  . ipratropium-albuterol  3 mL Nebulization BID  . lisinopril  20 mg Oral Daily  . loratadine  10 mg Oral Daily  . methylPREDNISolone (SOLU-MEDROL) injection  60 mg Intravenous Q6H  . metoprolol tartrate  25 mg Oral BID  . QUEtiapine  50 mg Oral QHS  . sertraline  25 mg Oral QHS      Time spent: 25 min  [facility] Pager [phone]. If 7PM-7AM, please contact night-coverage at [email or web address], Office  [phone]  password [email or web address]  08/20/2016, 2:43 PM  LOS: 1 day

## 2016-08-20 NOTE — Consult Note (Signed)
Cardiology Consultation:   Patient ID: Margareth Kanner; [id number]; 01/29/1951   Admit date: 08/19/2016 Date of Consult: 08/20/2016  Primary Care Provider: Darreld Mclean, MD Primary Cardiologist: Dr. Debara Pickett Primary Electrophysiologist:  NA   Patient Profile:   Rosely Fernandez is a 66 y.o. 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 Darrick Meigs.  History of Present Illness:   Ms. 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   She underwent an echocardiogram in stress Myoview in 2016 which was negative for ischemia.  She was also consulted by cardiac EP for an implanted loop recorder but declined it at the time. She had a repeat echo in March 2017 which showed EF 60-65%, normal LV wall thickness, no regional wall motion abnormalities.  Also hx of TIAs.  Had been cleared for knee surgery.  Pt now admitted with wheezing, SOB and productive cough for 5 days with hx of asthma.  She did not experience relief with inhalers and nebs at home.   She also complained of burning sensation in ant chest increased with coughing.    She tells me she did not feel well but no specific findings when she saw Dr. Debara Pickett.  Her symptoms increased. She would have chest heaviness most of the time and it increased with going to bathroom or any walking.  + coughing up a lot of mucus.    Treated in ER with IV solumedrol, nebs, and possible pna treated with ABX.    Troponin poc neg 0.00, CMP was with normal K+ 3.7, Na 139, Cr. 0.61, BUN 15.   LDL 51, TG 58, T chol 114   H/H 13.1/42.5  CXR Diffuse bronchial wall thickening compatible with reactive airways disease or viral bronchiolitis. No  evidence for focal pneumonia or overt pulmonary edema  EKG yesterday:  SR with T wave inversion in II,III, AVF, and lateral leads.  New from 08/16/16. EKG today SR with slower rate and improved TWI more laterally now though some remains inf.   Currently she feels better, the chest heaviness has resolved.  She continues to cough, just sitting up in bed set off coughing spell and she complains of Lt rib pain with the cough.    Past Medical History:  Diagnosis Date  . Cerebral aneurysm   . Complication of anesthesia HYPOTENSION INTRAOP W/ HYSTERECTOMY IN 1987--  DID OK W/ TOTAL KNEE IN 2008  . Diabetes mellitus   . DJD (degenerative joint disease)   . Dyslipidemia   . GERD (gastroesophageal reflux disease)   . H/O hiatal hernia   . History of CHF (congestive heart failure) 2010-  RESOLVED  . History of peptic ulcer YRS AGO  . Hypertension   . Meniere's disease   . Mitral valve prolapse occasional palpitations w/ chest discomfort  . OA (osteoarthritis)   . Rotator cuff tear, left   . Stroke [facility])     Past Surgical History:  Procedure Laterality Date  . BUNIONECTOMY  2007   LEFT FOOT  . LEFT SHOULDER SURGERY  1997   ROTATOR CUFF REPAIR  . SHOULDER ARTHROSCOPY  09/20/2011   Procedure: ARTHROSCOPY SHOULDER;  Surgeon: Johnn Hai,  MD;  Location: [place];  Service: Orthopedics;  Laterality: Left;  SAD AND MINI OPEN ROTATOR CUFF REPAIR    . TOTAL KNEE ARTHROPLASTY  09-12-2006  [facility]   RIGHT KNEE  . TUBAL LIGATION  1976  . TYMPANOPLASTY  1990;   1980  . VAGINAL HYSTERECTOMY  1987     Inpatient Medications: Scheduled Meds: . aspirin EC  325 mg Oral Daily  . budesonide  0.25 mg Inhalation BID  . clopidogrel  75 mg Oral Daily  . enoxaparin (LOVENOX) injection  40 mg Subcutaneous Q24H  . famotidine  10 mg Oral Daily  . furosemide  20 mg Oral Daily  . gabapentin  600 mg Oral BID  . insulin aspart  0-9 Units Subcutaneous TID WC  . ipratropium  2 spray Nasal QID   . ipratropium-albuterol  3 mL Nebulization BID  . lisinopril  20 mg Oral Daily  . loratadine  10 mg Oral Daily  . methylPREDNISolone (SOLU-MEDROL) injection  60 mg Intravenous Q6H  . metoprolol tartrate  25 mg Oral BID  . QUEtiapine  50 mg Oral QHS  . sertraline  25 mg Oral QHS   Continuous Infusions: . azithromycin    . cefTRIAXone (ROCEPHIN)  IV     PRN Meds: acetaminophen **OR** acetaminophen, albuterol, benzonatate, ipratropium-albuterol, ondansetron **OR** ondansetron (ZOFRAN) IV  Allergies:    Allergies  Allergen Reactions  . Influenza Vaccines Anaphylaxis  . Codeine Itching  . Demerol Other (See Comments)    SEIZURE  . Nsaids Other (See Comments)    NAPROXEN, IBUPROFEN, SKELAXIN---  CAUSES PALPITATIONS  . Prednisone     Caused her glucose to go up to 500 and went to ED  . Tramadol     Contraindicated with Victoza     Social History:   Social History   Social History  . Marital status: Widowed    Spouse name: N/A  . Number of children: 4  . Years of education: BA   Occupational History  . LPN - retired    Social History Main Topics  . Smoking status: Never Smoker  . Smokeless tobacco: Never Used  . Alcohol use No  . Drug use: No  . Sexual activity: Not on file   Other Topics Concern  . Not on file   Social History Narrative  . No narrative on file    Family History:   The patient's family history includes Aneurysm in her mother; Bipolar disorder in her son; Breast cancer in her maternal aunt, maternal aunt, maternal aunt, maternal grandmother, and mother; Congestive Heart Failure in her father; Diabetes in her father and mother; Heart attack in her sister; Heart disease in her mother and sister; Hypertension in her father and sister; Kidney disease in her mother; Lymphoma in her father; Other in her brother and son; Ovarian cancer in her mother; Stroke in her sister; Thyroid disease in her brother, mother, paternal grandmother, and sister.  ROS:    Please see the history of present illness.  ROS  General:+ colds + fevers, no weight changes Skin:no rashes or ulcers HEENT:no blurred vision, no congestion CV:see HPI PUL:see HPI GI:no diarrhea constipation or melena, no indigestion GU:no hematuria, no dysuria MS:no joint pain, no claudication Neuro:no syncope, no lightheadedness Endo:+ diabetes, no thyroid disease     Physical Exam/Data:   Vitals:   08/20/16 0058 08/20/16 0215 08/20/16 0630 08/20/16 0903  BP: 108/61  (!) 124/59   Pulse: 77  63   Resp: 15  17   Temp: 98.2 F (36.8 C)  97.7 F (36.5 C)   TempSrc: Oral  Oral   SpO2: 96% 97% 100% 99%  Weight:      Height:        Intake/Output Summary (Last 24 hours) at 08/20/16 1101 Last data filed at 08/20/16 0630  Gross per 24 hour  Intake              725 ml  Output              400 ml  Net              325 ml   Filed Weights   08/19/16 1807  Weight: 190 lb (86.2 kg)   Body mass index is 35.9 kg/m.  General:  Well nourished, well developed, in no acute distress lying still HEENT: normal Lymph: no adenopathy Neck: no JVD Endocrine:  No thryomegaly Vascular: No carotid bruits; 2+ pedal pulses no edema Cardiac:  normal S1, S2; RRR; no murmur gallup up or click Lungs:  Bilateral breath sounds to auscultation bilaterally, no wheezing, but + rhonchi no rales severe coughing with deep braths Abd: soft, nontender, no hepatomegaly  Ext: no edema Musculoskeletal:  No deformities, BUE and BLE strength normal and equal Skin: warm and dry  Neuro:  Alert and oriented X 3 MAE follows commands Psych:  Normal affect   EKG:  The EKG was personally reviewed see above Telemetry:  Telemetry pt is not on tele  Relevant CV Studies: PREVIOUS STUDIES  04/15/15 ------------------------------------------------------------------- Study Conclusions  - Left ventricle: The cavity size was normal. Wall thickness was   normal. Systolic function was normal. The estimated  ejection   fraction was in the range of 60% to 65%. Wall motion was normal;   there were no regional wall motion abnormalities.  11/13/14 Nuc study Study Result    T wave inversion was noted during stress in the III leads.  There was no ST segment deviation noted during stress.  The study is normal.  The left ventricular ejection fraction is normal (55-65%).   Normal study, no evidence for ischemia or infarction.       Laboratory Data:  Chemistry Recent Labs Lab 08/19/16 1927 08/20/16 0436  NA 140 139  K 3.2* 3.7  CL 104 108  CO2 25 25  GLUCOSE 120* 154*  BUN 18 15  CREATININE 0.76 0.61  CALCIUM 9.1 8.5*  GFRNONAA >60 >60  GFRAA >60 >60  ANIONGAP 11 6     Recent Labs Lab 08/20/16 0436  PROT 5.9*  ALBUMIN 3.4*  AST 16  ALT 11*  ALKPHOS 61  BILITOT 0.4   Hematology Recent Labs Lab 08/19/16 1927 08/20/16 0436  WBC 7.4 4.8  RBC 4.98 4.63  HGB 14.1 13.1  HCT 45.0 42.5  MCV 90.4 91.8  MCH 28.3 28.3  MCHC 31.3 30.8  RDW 13.9 14.3  PLT 233 225   Cardiac EnzymesNo results for input(s): TROPONINI in the last 168 hours.  Recent Labs Lab 08/19/16 2001  TROPIPOC 0.00    BNPNo results for input(s): BNP, PROBNP in the last 168 hours.  DDimer No results for input(s): DDIMER in the last 168 hours.  Radiology/Studies:  Dg Chest 2 View  Result Date: 08/19/2016 CLINICAL DATA:  Productive cough with fever. EXAM: CHEST  2 VIEW COMPARISON:  06/23/2014. FINDINGS: Low volume film with bronchial wall thickening and diffuse interstitial opacity. No dense focal airspace consolidation. No pleural effusion. Cardiopericardial silhouette is  at upper limits of normal for size. The visualized bony structures of the thorax are intact. IMPRESSION: Diffuse bronchial wall thickening compatible with reactive airways disease or viral bronchiolitis. No evidence for focal pneumonia or overt pulmonary edema. Electronically Signed   By: Misty Stanley M.D.   On: 08/19/2016 18:57     Assessment and Plan:   1. SOB due to acute asthma exacerbation   2. Abnormal EKG neg troponin poc on admit. Improved EKG today - will recheck troponin.  Overall her symptoms have improved with treatment for Asthma excerebration - chest heaviness has resolved.  She is planning on knee surgery in future - may need lexiscan myoview but not until cough improved.    3.   HTN  Controlled. On ACE, metoprolol   4. Bronchitis on abx per IM  5. Hx of TIAs and cerebral aneurysm unable to coil.    Signed, Cecilie Kicks, NP  08/20/2016 11:01 AM  Personally seen and examined. Agree with above.  66 year old female with acute asthma exacerbation with abnormal EKG, chest discomfort Currently chest pain-free but still dealing with cough. Exam reveals wheezes in the lungs bilaterally, normal-appearing respiratory effort however. She is alert, regular rate and rhythm with 1/6 systolic murmur left lower sternal border.  Chest pain  - ECG did show accentuated T-wave inversions during tachycardia fairly diffusely. (Personally reviewed ECG)  - Troponin thus far has been negative. We will recheck.  - Her symptoms of chest discomfort have improved after treatment of her asthma. It is likely that her chest pain is associated with her current respiratory illness, however she did have T-wave changes that were accentuated as noted above.  - After she is over this respiratory illness, I think that an outpatient stress test would be helpful.  Asthma exacerbation/bronchitis  - Nebulizers, steroids per primary team.  Essential hypertension  - Currently well controlled. Medications reviewed.  Candee Furbish, MD

## 2016-08-21 ENCOUNTER — Encounter: Payer: Self-pay | Admitting: Family Medicine

## 2016-08-21 DIAGNOSIS — R0789 Other chest pain: Secondary | ICD-10-CM

## 2016-08-21 LAB — GLUCOSE, CAPILLARY
Glucose-Capillary: 183 mg/dL — ABNORMAL HIGH (ref 65–99)
Glucose-Capillary: 284 mg/dL — ABNORMAL HIGH (ref 65–99)
Glucose-Capillary: 276 mg/dL — ABNORMAL HIGH (ref 65–99)

## 2016-08-21 LAB — LEGIONELLA PNEUMOPHILA SEROGP 1 UR AG: L. pneumophila Serogp 1 Ur Ag: NEGATIVE

## 2016-08-21 MED ORDER — AZITHROMYCIN 500 MG PO TABS: 500.0000 mg | ORAL_TABLET | Freq: Every day | ORAL | 0 refills | Status: AC

## 2016-08-21 MED ORDER — PREDNISONE 10 MG PO TABS: ORAL_TABLET | ORAL | 0 refills | Status: DC

## 2016-08-21 NOTE — Progress Notes (Addendum)
Progress Note  Patient Name: Sherry [name] Date of Encounter: 08/21/2016  Primary Cardiologist: hilty  Subjective   SOB improved but still present, cough  Inpatient Medications    Scheduled Meds: . aspirin EC  325 mg Oral Daily  . budesonide  0.25 mg Inhalation BID  . clopidogrel  75 mg Oral Daily  . enoxaparin (LOVENOX) injection  40 mg Subcutaneous Q24H  . famotidine  10 mg Oral Daily  . furosemide  20 mg Oral Daily  . gabapentin  600 mg Oral BID  . insulin aspart  0-9 Units Subcutaneous TID WC  . ipratropium  2 spray Nasal QID  . ipratropium-albuterol  3 mL Nebulization TID  . lisinopril  20 mg Oral Daily  . loratadine  10 mg Oral Daily  . methylPREDNISolone (SOLU-MEDROL) injection  60 mg Intravenous Q6H  . metoprolol tartrate  25 mg Oral BID  . QUEtiapine  50 mg Oral QHS  . sertraline  25 mg Oral QHS   Continuous Infusions: . azithromycin Stopped (08/20/16 2354)  . cefTRIAXone (ROCEPHIN)  IV Stopped (08/20/16 2216)   PRN Meds: acetaminophen **OR** acetaminophen, albuterol, benzonatate, ondansetron **OR** ondansetron (ZOFRAN) IV   Vital Signs    Vitals:   08/20/16 2034 08/20/16 2208 08/21/16 0449 08/21/16 0839  BP:  (!) 156/83 (!) 144/81   Pulse:  79 65   Resp:  18 16   Temp:  97.8 F (36.6 C) 97.7 F (36.5 C)   TempSrc:  Oral Oral   SpO2: 98% 96% 98% 99%  Weight:      Height:        Intake/Output Summary (Last 24 hours) at 08/21/16 1150 Last data filed at 08/21/16 0450  Gross per 24 hour  Intake             1780 ml  Output              350 ml  Net             1430 ml   Filed Weights   08/19/16 1807  Weight: 190 lb (86.2 kg)    Telemetry    none - Personally Reviewed  ECG     Accentuated TWI diffuse- Personally Reviewed  Physical Exam   GEN: No acute distress.   Neck: No JVD Cardiac: RRR, 1/6 SM, no rubs, or gallops.  Respiratory: Decreased air movement B.  GI: Soft, nontender, non-distended  MS: No edema; No deformity. Neuro:   Nonfocal  Psych: Normal affect   Labs    Chemistry Recent Labs Lab 08/19/16 1927 08/20/16 0436  NA 140 139  K 3.2* 3.7  CL 104 108  CO2 25 25  GLUCOSE 120* 154*  BUN 18 15  CREATININE 0.76 0.61  CALCIUM 9.1 8.5*  PROT  --  5.9*  ALBUMIN  --  3.4*  AST  --  16  ALT  --  11*  ALKPHOS  --  61  BILITOT  --  0.4  GFRNONAA >60 >60  GFRAA >60 >60  ANIONGAP 11 6     Hematology Recent Labs Lab 08/19/16 1927 08/20/16 0436  WBC 7.4 4.8  RBC 4.98 4.63  HGB 14.1 13.1  HCT 45.0 42.5  MCV 90.4 91.8  MCH 28.3 28.3  MCHC 31.3 30.8  RDW 13.9 14.3  PLT 233 225    Cardiac Enzymes Recent Labs Lab 08/20/16 1238  TROPONINI <0.03    Recent Labs Lab 08/19/16 2001  TROPIPOC 0.00     BNPNo  results for input(s): BNP, PROBNP in the last 168 hours.   DDimer No results for input(s): DDIMER in the last 168 hours.   Radiology    Dg Chest 2 View  Result Date: 08/19/2016 CLINICAL DATA:  Productive cough with fever. EXAM: CHEST  2 VIEW COMPARISON:  06/23/2014. FINDINGS: Low volume film with bronchial wall thickening and diffuse interstitial opacity. No dense focal airspace consolidation. No pleural effusion. Cardiopericardial silhouette is at upper limits of normal for size. The visualized bony structures of the thorax are intact. IMPRESSION: Diffuse bronchial wall thickening compatible with reactive airways disease or viral bronchiolitis. No evidence for focal pneumonia or overt pulmonary edema. Electronically Signed   By: Misty Stanley M.D.   On: 08/19/2016 18:57    Cardiac Studies   ECHO 04/14/16 - Left ventricle: The cavity size was normal. Wall thickness was   normal. Systolic function was normal. The estimated ejection   fraction was in the range of 60% to 65%. Wall motion was normal;   there were no regional wall motion abnormalities.  Patient Profile     66 y.o. female with acute asthma exacerbation with abnormal EKG, chest discomfort  Assessment & Plan    Chest  pain  - most likely from increased cough, asthma, but would suggest future evaluation as outpatient with stress test given the accentuation of TWI noted on ECG.   - Trop neg  Asthma  - per main team  HTN  - controlled.  Will sign off, please call if ?. OK from our perspective for DC.   Signed, Candee Furbish, MD  08/21/2016, 11:50 AM

## 2016-08-21 NOTE — Discharge Summary (Signed)
Physician Discharge Summary  Sherry [name] IWL:[id number] DOB: 02/12/1950 DOA: 08/19/2016  PCP: Darreld Mclean, MD  Admit date: 08/19/2016 Discharge date: 08/21/2016  Time spent: *35 minutes  Recommendations for Outpatient Follow-up:  1. *Follow up PCP in 2 weeks   Discharge Diagnoses:  Active Problems:   Acute asthma exacerbation   Discharge Condition: Stable  Diet recommendation: Carbohydrate modified diet  Filed Weights   08/19/16 1807  Weight: 86.2 kg (190 lb)    History of present illness:  66 y.o.femalewith medical history significant of sob, coughing. Present for 6 days, progressive, increase cough, phlegm, sob. Used her inhaler and Nebulizer more often than usual. No sick contacts, but thinks her grandson might be coming down with something. + n/v after a very bad coughing spell, NBNB. No chest pain, palpitations, hemoptysis, fever, chills, abdominal pain, dysuria, hematuria, polyuria, nocturia, BRBPR, melena, hematochezia, heat/cold intolerance, orthopnea, PND, leg swelling.  Hospital Course:  1. Acute asthma exacerbation-improved, patient was started on IV Solu Medrol 60 mg  every 6 hours, ceftriaxone and Zithromax ,DuoNeb nebulizers every 6 hours. At this time she is improved. We'll discharge on prednisone taper for 5 days, Zithromax 500 mg daily for 3 more days 2. Abnormal EKG-patient has T-wave inversions in multiple leads which is new from previous EKG from 7/09when she was seen by cardiology for preop clearance. Cardiology was consulted, and recommended outpatient cardiac stress test. Will have patient follow-up cardiology Dr. Marlou Porch as outpatient. Cardiac enzymes were negative in the hospital. Patient did not have any chest pain. 3. Diabetes mellitus-continue oral hypoglycemic agents 4. Hypertension- continue metoprolol, lisinopril 5. History of CVA-stable, continue Plavix, aspirin. 6. Anxiety-continue sertraline  Procedures:  None    Consultations:  Cardiology  Discharge Exam: Vitals:   08/21/16 0449 08/21/16 1357  BP: (!) 144/81 (!) 143/77  Pulse: 65 70  Resp: 16 16  Temp: 97.7 F (36.5 C) 98 F (36.7 C)    General: Appears in no acute distress Cardiovascular: S1-S2 regular Respiratory: Clear to auscultation bilaterally  Discharge Instructions   Discharge Instructions    Diet - low sodium heart healthy    Complete by:  As directed    Increase activity slowly    Complete by:  As directed      Current Discharge Medication List    START taking these medications   Details  azithromycin (ZITHROMAX) 500 MG tablet Take 1 tablet (500 mg total) by mouth daily. Take 1 tablet daily for 3 days. Qty: 3 tablet, Refills: 0    predniSONE (DELTASONE) 10 MG tablet Prednisone 40 mg po daily x 1 day then Prednisone 30 mg po daily x 1 day then Prednisone 20 mg po daily x 1 day then Prednisone 10 mg daily x 1 day then stop... Qty: 10 tablet, Refills: 0      CONTINUE these medications which have NOT CHANGED   Details  albuterol (PROVENTIL HFA;VENTOLIN HFA) 108 (90 Base) MCG/ACT inhaler Inhale 2 puffs into the lungs every 6 (six) hours as needed. For shortness of breath. Qty: 18 g, Refills: 2    albuterol (PROVENTIL) (2.5 MG/3ML) 0.083% nebulizer solution Take 3 mLs (2.5 mg total) by nebulization every 6 (six) hours as needed for wheezing or shortness of breath. Qty: 150 mL, Refills: 1    aspirin EC 325 MG tablet Take 1 tablet (325 mg total) by mouth daily. Qty: 90 tablet, Refills: 1    cetirizine (ZYRTEC) 10 MG tablet Take 1 tablet (10 mg total) by mouth daily. Qty:  90 tablet, Refills: 3    clopidogrel (PLAVIX) 75 MG tablet Take 1 tablet (75 mg total) by mouth daily. Qty: 90 tablet, Refills: 3    empagliflozin (JARDIANCE) 10 MG TABS tablet Take 5 mg by mouth daily. Qty: 45 tablet, Refills: 3   Associated Diagnoses: Type 2 diabetes mellitus with complication, without long-term current use of insulin  (HCC)    famotidine (PEPCID) 10 MG tablet Take 10 mg by mouth daily.    furosemide (LASIX) 20 MG tablet Take 1 tab daily. Qty: 90 tablet, Refills: 3   Associated Diagnoses: Essential hypertension    gabapentin (NEURONTIN) 300 MG capsule Take 2 capsules (600 mg total) by mouth 2 (two) times daily. Qty: 360 capsule, Refills: 1   Associated Diagnoses: Type 2 diabetes mellitus with complication, without long-term current use of insulin (HCC)    ipratropium (ATROVENT) 0.03 % nasal spray Place 2 sprays into the nose 4 (four) times daily. Qty: 30 mL, Refills: 6   Associated Diagnoses: Post-nasal drainage    linagliptin (TRADJENTA) 5 MG TABS tablet Take 0.5 tablets (2.5 mg total) by mouth daily. Qty: 45 tablet, Refills: 3   Associated Diagnoses: Type 2 diabetes mellitus with complication, without long-term current use of insulin (HCC)    lisinopril (PRINIVIL,ZESTRIL) 20 MG tablet Take 1 tablet (20 mg total) by mouth daily. Qty: 90 tablet, Refills: 3   Associated Diagnoses: Essential hypertension    metFORMIN (GLUCOPHAGE) 500 MG tablet Take 2 tablets (1,000 mg total) by mouth 2 (two) times daily. TAKE 2 TABLETS(1000 MG) BY MOUTH TWICE DAILY WITH A MEAL Qty: 360 tablet, Refills: 3    metoprolol tartrate (LOPRESSOR) 25 MG tablet Take 1 tablet (25 mg total) by mouth 2 (two) times daily. Qty: 180 tablet, Refills: 3   Associated Diagnoses: Essential hypertension    mometasone (ASMANEX) 220 MCG/INH inhaler Use 1 puff daily followed by rinsing your mouth out with water. Qty: 1 Inhaler, Refills: 12    QUEtiapine (SEROQUEL) 100 MG tablet Take 0.5 tablets (50 mg total) by mouth at bedtime. Qty: 45 tablet, Refills: 3    sertraline (ZOLOFT) 50 MG tablet Take 0.5 tablets (25 mg total) by mouth at bedtime. Qty: 45 tablet, Refills: 3   Associated Diagnoses: Major depressive disorder, single episode, mild (HCC)    docusate sodium (COLACE) 100 MG capsule Take 1 capsule (100 mg total) by mouth  daily. Qty: 10 capsule, Refills: 2    HYDROcodone-acetaminophen (NORCO/VICODIN) 5-325 MG tablet Take 1 tablet by mouth every 4 (four) hours as needed. Qty: 10 tablet, Refills: 0    LORazepam (ATIVAN) 1 MG tablet Take 1 tablet (1 mg total) by mouth 3 (three) times daily as needed for anxiety. Qty: 15 tablet, Refills: 0       Allergies  Allergen Reactions  . Influenza Vaccines Anaphylaxis  . Codeine Itching  . Demerol Other (See Comments)    SEIZURE  . Nsaids Other (See Comments)    NAPROXEN, IBUPROFEN, SKELAXIN---  CAUSES PALPITATIONS  . Prednisone     Caused her glucose to go up to 500 and went to ED  . Tramadol     Contraindicated with Victoza       The results of significant diagnostics from this hospitalization (including imaging, microbiology, ancillary and laboratory) are listed below for reference.    Significant Diagnostic Studies: Dg Chest 2 View  Result Date: 08/19/2016 CLINICAL DATA:  Productive cough with fever. EXAM: CHEST  2 VIEW COMPARISON:  06/23/2014. FINDINGS: Low volume film with  bronchial wall thickening and diffuse interstitial opacity. No dense focal airspace consolidation. No pleural effusion. Cardiopericardial silhouette is at upper limits of normal for size. The visualized bony structures of the thorax are intact. IMPRESSION: Diffuse bronchial wall thickening compatible with reactive airways disease or viral bronchiolitis. No evidence for focal pneumonia or overt pulmonary edema. Electronically Signed   By: Misty Stanley M.D.   On: 08/19/2016 18:57    Microbiology: Recent Results (from the past 240 hour(s))  Blood Culture (routine x 2)     Status: None (Preliminary result)   Collection Time: 08/19/16  9:58 PM  Result Value Ref Range Status   Specimen Description BLOOD RIGHT ANTECUBITAL  Final   Special Requests   Final    BOTTLES DRAWN AEROBIC AND ANAEROBIC Blood Culture adequate volume   Culture NO GROWTH 2 DAYS  Final   Report Status PENDING   Incomplete  Blood Culture (routine x 2)     Status: None (Preliminary result)   Collection Time: 08/19/16 10:03 PM  Result Value Ref Range Status   Specimen Description BLOOD LEFT HAND  Final   Special Requests   Final    BOTTLES DRAWN AEROBIC ONLY Blood Culture adequate volume   Culture NO GROWTH 2 DAYS  Final   Report Status PENDING  Incomplete     Labs: Basic Metabolic Panel:  Recent Labs Lab 08/19/16 1927 08/20/16 0436  NA 140 139  K 3.2* 3.7  CL 104 108  CO2 25 25  GLUCOSE 120* 154*  BUN 18 15  CREATININE 0.76 0.61  CALCIUM 9.1 8.5*  MG 1.8  --    Liver Function Tests:  Recent Labs Lab 08/20/16 0436  AST 16  ALT 11*  ALKPHOS 61  BILITOT 0.4  PROT 5.9*  ALBUMIN 3.4*   No results for input(s): LIPASE, AMYLASE in the last 168 hours. No results for input(s): AMMONIA in the last 168 hours. CBC:  Recent Labs Lab 08/19/16 1927 08/20/16 0436  WBC 7.4 4.8  HGB 14.1 13.1  HCT 45.0 42.5  MCV 90.4 91.8  PLT 233 225   Cardiac Enzymes:  Recent Labs Lab 08/20/16 1238  TROPONINI <0.03    CBG:  Recent Labs Lab 08/20/16 1727 08/20/16 2128 08/21/16 0832 08/21/16 1205 08/21/16 1650  GLUCAP 220* 254* 183* 284* 276*       Signed:  Eleonore Chiquito S MD.  [facility] 08/21/2016, 5:56 PM

## 2016-08-23 ENCOUNTER — Telehealth: Payer: Self-pay | Admitting: Behavioral Health

## 2016-08-23 NOTE — Telephone Encounter (Signed)
Appt scheduled 08/25/2016.

## 2016-08-23 NOTE — Telephone Encounter (Signed)
Transition Care Management Follow-up Telephone Call  PCP: Copland, Gay Filler, MD  Admit date: 08/19/2016 Discharge date: 08/21/2016   Recommendations for Outpatient Follow-up:  1. *Follow up PCP in 2 weeks   Discharge Diagnoses:  Active Problems:   Acute asthma exacerbation   Discharge Condition: Stable    How have you been since you were released from the hospital? Patient stated, "I have up & down periods; a lot of wheezing, but have been using my inhalers every 6 hours & then nebulizer treatments in between".   Do you understand why you were in the hospital? yes, asthma ruled out pneumonia.   Do you understand the discharge instructions? yes   Where were you discharged to? Home   Items Reviewed:  Medications reviewed: yes  Allergies reviewed: yes  Dietary changes reviewed: yes, heart healthy, carb-modified diet  Referrals reviewed: yes, Follow up PCP in 2 weeks   Functional Questionnaire:   Activities of Daily Living (ADLs):   She states they are independent in the following: ambulation, bathing and hygiene, feeding, continence, grooming, toileting and dressing States they require assistance with the following: None   Any transportation issues/concerns?: no   Any patient concerns? yes, would like to have a pulse ox to monitor oxygen level or when she's feels like she may be in distress.   Confirmed importance and date/time of follow-up visits scheduled yes, 08/25/16 at 2:30 PM.  Provider Appointment booked with Dr. Lorelei Pont.  Confirmed with patient if condition begins to worsen call PCP or go to the ER.  Patient was given the office number and encouraged to call back with question or concerns.  : yes

## 2016-08-23 NOTE — Consult Note (Signed)
           [facility] CM Primary Care Navigator  08/23/2016  Sherry [name] 1950-09-02 [id number]   Went to see patientat the bedsideto identify possible discharge needs but she was alreadydischarged .  Patient was discharged home over the weekend.  Primary care provider's office called Drue Dun for [place])to notify of patient's discharge and need for post hospital follow-up and transition of care. Notifiedof patient's health issues needing follow-up as well.  Made aware to refer patient to [facility] care management ifdeemed appropriatefor services.   For questions, please contact:  Dannielle Huh, BSN, RN- [facility] Primary Care Navigator  Telephone: [phone] [place]

## 2016-08-24 LAB — CULTURE, BLOOD (ROUTINE X 2)
Culture: NO GROWTH
Culture: NO GROWTH
Special Requests: ADEQUATE
Special Requests: ADEQUATE

## 2016-08-25 ENCOUNTER — Encounter: Payer: Self-pay | Admitting: Family Medicine

## 2016-08-25 ENCOUNTER — Ambulatory Visit (INDEPENDENT_AMBULATORY_CARE_PROVIDER_SITE_OTHER): Payer: Medicare Other | Admitting: Family Medicine

## 2016-08-25 VITALS — BP 133/85 | HR 67 | Temp 98.0°F | Ht 61.0 in | Wt 187.0 lb

## 2016-08-25 DIAGNOSIS — I10 Essential (primary) hypertension: Secondary | ICD-10-CM

## 2016-08-25 DIAGNOSIS — J45901 Unspecified asthma with (acute) exacerbation: Secondary | ICD-10-CM

## 2016-08-25 DIAGNOSIS — E118 Type 2 diabetes mellitus with unspecified complications: Secondary | ICD-10-CM

## 2016-08-25 MED ORDER — FLUTICASONE PROPIONATE HFA 220 MCG/ACT IN AERO: 1.0000 | INHALATION_SPRAY | Freq: Two times a day (BID) | RESPIRATORY_TRACT | 12 refills | Status: DC

## 2016-08-25 MED ORDER — MONTELUKAST SODIUM 10 MG PO TABS: 10.0000 mg | ORAL_TABLET | Freq: Every day | ORAL | 3 refills | Status: DC

## 2016-08-25 MED FILL — FLOVENT HFA 220 MCG INHALER: 220 | 30 days supply | Qty: 12 | Fill #0

## 2016-08-25 MED FILL — MONTELUKAST SOD 10 MG TAB: 10 | 30 days supply | Qty: 30 | Fill #0

## 2016-08-25 NOTE — Progress Notes (Signed)
Pre visit review using our clinic tool,if applicable. No additional management support is needed unless otherwise documented below in the visit note.  

## 2016-08-25 NOTE — Patient Instructions (Signed)
It was nice to see you today- I am sorry that you have been sick!   We are going to add a couple of medications for asthma- we will have you use these for the next 1-2 months but I hope they will not be permanent Add the flovent (inhaled steroid) 1 or 2 puffs twice a day Also add singulair 1 tablet daily Continue albuterol as needed If you are not improving please let me know!   I will check your A1c and let you know how it looks today

## 2016-08-25 NOTE — Progress Notes (Addendum)
[place] at [facility] [address] [place], [place] [address] [phone]  Date:  08/25/2016   Name:  Sherry [name]   DOB:  06/11/1950   MRN:  [id number]  PCP:  Sherry Mclean, MD    Chief Complaint: Hospitalization Follow-up   History of Present Illness:  Sherry [name] is a 66 y.o. very pleasant female patient who presents with the following:  Here today for a hospital follow-up visit- she was recently admitted with an asthma exacerbation as below:   Admit date: 08/19/2016 Discharge date: 08/21/2016  Discharge Diagnoses:  Active Problems:   Acute asthma exacerbation     Filed Weights   08/19/16 1807  Weight: 86.2 kg (190 lb)    History of present illness:  66 y.o.femalewith medical history significant of sob, coughing. Present for 6 days, progressive, increase cough, phlegm, sob. Used her inhaler and Nebulizer more often than usual. No sick contacts, but thinks her grandson might be coming down with something. + n/v after a very bad coughing spell, NBNB. No chest pain, palpitations, hemoptysis, fever, chills, abdominal pain, dysuria, hematuria, polyuria, nocturia, BRBPR, melena, hematochezia, heat/cold intolerance, orthopnea, PND, leg swelling.  Hospital Course:  1. Acute asthma exacerbation-improved, patient was started on IV Solu Medrol 60 mg every 6 hours, ceftriaxone and Zithromax ,DuoNeb nebulizers every 6 hours. At this time she is improved. We'll discharge on prednisone taper for 5 days, Zithromax 500 mg daily for 3 more days 2. Abnormal EKG-patient has T-wave inversions in multiple leads which is new from previous EKG from 7/09when she was seen by cardiology for preop clearance. Cardiology was consulted, and recommended outpatient cardiac stress test. Will have patient follow-up cardiology Dr. Marlou Porch as outpatient. Cardiac enzymes were negative in the hospital. Patient did not have any chest  pain. 3. Diabetes mellitus-continue oral hypoglycemic agents 4. Hypertension- continue metoprolol, lisinopril 5. History of CVA-stable, continue Plavix,aspirin. 6. Anxiety-continue sertraline  Procedures:  None   Consultations:  Cardiology  Discharge Exam:     Vitals:   08/21/16 0449 08/21/16 1357  BP: (!) 144/81 (!) 143/77  Pulse: 65 70  Resp: 16 16  Temp: 97.7 F (36.5 C) 98 F (36.7 C)    She took her last prednisone this am Notes that she still may feel a little SOB with exertion- however much better than she was She is coughing and wheezing still   Right now she is needing her albuterol every 6 hours - she has neb and puffer to use as needed  She is not on any inhaled steroid at this time She normally does not have a lot of trouble with her asthma- as a general rule she uses her albuterol only rarely. Has not needed other adjunct medications to control her sx   Dg Chest 2 View  Result Date: 08/19/2016 CLINICAL DATA:  Productive cough with fever. EXAM: CHEST  2 VIEW COMPARISON:  06/23/2014. FINDINGS: Low volume film with bronchial wall thickening and diffuse interstitial opacity. No dense focal airspace consolidation. No pleural effusion. Cardiopericardial silhouette is at upper limits of normal for size. The visualized bony structures of the thorax are intact. IMPRESSION: Diffuse bronchial wall thickening compatible with reactive airways disease or viral bronchiolitis. No evidence for focal pneumonia or overt pulmonary edema. Electronically Signed   By: Misty Stanley M.D.   On: 08/19/2016 18:57   She is still on her zyrtec  BP Readings from Last 3 Encounters:  08/25/16 133/85  08/21/16 (!) 143/77  08/16/16 112/66   Lab Results  Component Value Date   HGBA1C 6.8 05/19/2016   She had a cholesterol panel in the hospital which looked great.    Patient Active Problem List   Diagnosis Date Noted  . Acute asthma exacerbation 08/19/2016  . Preoperative  cardiovascular examination 08/16/2016  . Cervicalgia 03/23/2016  . Acute back pain with sciatica, right 01/14/2016  . Vertigo 04/14/2015  . Anxiety state 01/22/2015  . HLD (hyperlipidemia) 01/22/2015  . Type 2 diabetes mellitus with circulatory disorder ([place]) 01/22/2015  . Intracranial vascular stenosis 01/22/2015  . Aneurysm ([place])   . Headache   . Diabetes mellitus with complication ([place]) 09/73/5329  . Essential hypertension 11/12/2014  . Dependent edema 11/12/2014  . Peripheral neuropathy 11/12/2014  . Depression 11/12/2014  . Facial droop   . Chest pain   . TIA (transient ischemic attack) 11/11/2014  . Dyspnea 08/06/2014  . Hyperthyroidism 08/06/2014  . Meniere's disease 06/07/2014  . History of CHF (congestive heart failure) 06/07/2014  . Essential hypertension, benign 06/13/2013  . Goiter 06/13/2013  . Obesity, unspecified 06/13/2013    Past Medical History:  Diagnosis Date  . Cerebral aneurysm   . Complication of anesthesia HYPOTENSION INTRAOP W/ HYSTERECTOMY IN 1987--  DID OK W/ TOTAL KNEE IN 2008  . Diabetes mellitus   . DJD (degenerative joint disease)   . Dyslipidemia   . GERD (gastroesophageal reflux disease)   . H/O hiatal hernia   . History of CHF (congestive heart failure) 2010-  RESOLVED  . History of peptic ulcer YRS AGO  . Hypertension   . Meniere's disease   . Mitral valve prolapse occasional palpitations w/ chest discomfort  . OA (osteoarthritis)   . Rotator cuff tear, left   . Stroke [facility])     Past Surgical History:  Procedure Laterality Date  . BUNIONECTOMY  2007   LEFT FOOT  . LEFT SHOULDER SURGERY  1997   ROTATOR CUFF REPAIR  . SHOULDER ARTHROSCOPY  09/20/2011   Procedure: ARTHROSCOPY SHOULDER;  Surgeon: Johnn Hai, MD;  Location: [facility];  Service: Orthopedics;  Laterality: Left;  SAD AND MINI OPEN ROTATOR CUFF REPAIR    . TOTAL KNEE ARTHROPLASTY  09-12-2006  [facility]   RIGHT KNEE  . TUBAL LIGATION  1976  .  TYMPANOPLASTY  1990;   1980  . VAGINAL HYSTERECTOMY  1987    Social History  Substance Use Topics  . Smoking status: Never Smoker  . Smokeless tobacco: Never Used  . Alcohol use No    Family History  Problem Relation Age of Onset  . Heart disease Mother        in her 21s, heart attack  . Diabetes Mother   . Kidney disease Mother        dialysis  . Thyroid disease Mother   . Aneurysm Mother   . Ovarian cancer Mother   . Breast cancer Mother   . Thyroid disease Sister   . Heart disease Sister   . Lymphoma Father   . Diabetes Father   . Hypertension Father   . Congestive Heart Failure Father   . Thyroid disease Brother   . Thyroid disease Paternal Grandmother   . Heart attack Sister        at age 43  . Stroke Sister   . Hypertension Sister   . Breast cancer Maternal Aunt   . Breast cancer Maternal Grandmother   . Breast cancer Maternal Aunt   .  Breast cancer Maternal Aunt   . Other Brother        Musician accident  . Bipolar disorder Son   . Other Son        GSW    Allergies  Allergen Reactions  . Influenza Vaccines Anaphylaxis  . Codeine Itching  . Demerol Other (See Comments)    SEIZURE  . Nsaids Other (See Comments)    NAPROXEN, IBUPROFEN, SKELAXIN---  CAUSES PALPITATIONS  . Prednisone     Caused her glucose to go up to 500 and went to ED  . Tramadol     Contraindicated with Victoza     Medication list has been reviewed and updated.  Current Outpatient Prescriptions on File Prior to Visit  Medication Sig Dispense Refill  . albuterol (PROVENTIL HFA;VENTOLIN HFA) 108 (90 Base) MCG/ACT inhaler Inhale 2 puffs into the lungs every 6 (six) hours as needed. For shortness of breath. 18 g 2  . albuterol (PROVENTIL) (2.5 MG/3ML) 0.083% nebulizer solution Take 3 mLs (2.5 mg total) by nebulization every 6 (six) hours as needed for wheezing or shortness of breath. 150 mL 1  . aspirin EC 325 MG tablet Take 1 tablet (325 mg total) by mouth daily. 90 tablet 1  .  cetirizine (ZYRTEC) 10 MG tablet Take 1 tablet (10 mg total) by mouth daily. 90 tablet 3  . clopidogrel (PLAVIX) 75 MG tablet Take 1 tablet (75 mg total) by mouth daily. 90 tablet 3  . docusate sodium (COLACE) 100 MG capsule Take 1 capsule (100 mg total) by mouth daily. 10 capsule 2  . empagliflozin (JARDIANCE) 10 MG TABS tablet Take 5 mg by mouth daily. 45 tablet 3  . famotidine (PEPCID) 10 MG tablet Take 10 mg by mouth daily.    . furosemide (LASIX) 20 MG tablet Take 1 tab daily. (Patient taking differently: Take 20 mg by mouth daily. ) 90 tablet 3  . gabapentin (NEURONTIN) 300 MG capsule Take 2 capsules (600 mg total) by mouth 2 (two) times daily. 360 capsule 1  . HYDROcodone-acetaminophen (NORCO/VICODIN) 5-325 MG tablet Take 1 tablet by mouth every 4 (four) hours as needed. 10 tablet 0  . ipratropium (ATROVENT) 0.03 % nasal spray Place 2 sprays into the nose 4 (four) times daily. 30 mL 6  . linagliptin (TRADJENTA) 5 MG TABS tablet Take 0.5 tablets (2.5 mg total) by mouth daily. 45 tablet 3  . lisinopril (PRINIVIL,ZESTRIL) 20 MG tablet Take 1 tablet (20 mg total) by mouth daily. 90 tablet 3  . LORazepam (ATIVAN) 1 MG tablet Take 1 tablet (1 mg total) by mouth 3 (three) times daily as needed for anxiety. 15 tablet 0  . metFORMIN (GLUCOPHAGE) 500 MG tablet Take 2 tablets (1,000 mg total) by mouth 2 (two) times daily. TAKE 2 TABLETS(1000 MG) BY MOUTH TWICE DAILY WITH A MEAL 360 tablet 3  . metoprolol tartrate (LOPRESSOR) 25 MG tablet Take 1 tablet (25 mg total) by mouth 2 (two) times daily. 180 tablet 3  . mometasone (ASMANEX) 220 MCG/INH inhaler Use 1 puff daily followed by rinsing your mouth out with water. (Patient taking differently: Inhale 1 puff into the lungs daily. \) 1 Inhaler 12  . predniSONE (DELTASONE) 10 MG tablet Prednisone 40 mg po daily x 1 day then Prednisone 30 mg po daily x 1 day then Prednisone 20 mg po daily x 1 day then Prednisone 10 mg daily x 1 day then stop... 10 tablet 0   . QUEtiapine (SEROQUEL) 100 MG tablet Take  0.5 tablets (50 mg total) by mouth at bedtime. 45 tablet 3  . sertraline (ZOLOFT) 50 MG tablet Take 0.5 tablets (25 mg total) by mouth at bedtime. 45 tablet 3   Current Facility-Administered Medications on File Prior to Visit  Medication Dose Route Frequency Provider Last Rate Last Dose  . 0.9 %  sodium chloride infusion  500 mL Intravenous Continuous Nandigam, Kavitha V, MD      . lidocaine (PF) (XYLOCAINE) 1 % injection 0.3 mL  0.3 mL Other Once Magnus Sinning, MD        Review of Systems:  As per HPI- otherwise negative. No fever or chills She is eating ok Her glucose has been higher since using prednisone but not out of control  Physical Examination:  Blood pressure 133/85, pulse 67, temperature 98 F (36.7 C), temperature source Oral, height 5\' 1"  (1.549 m), weight 87 lb 6.4 oz (39.6 kg), SpO2 100 %.  GEN: WDWN, NAD, Non-toxic, A & O x 3 HEENT: Atraumatic, Normocephalic. Neck supple. No masses, No LAD. Ears and Nose: No external deformity. CV: RRR, No M/G/R. No JVD. No thrill. No extra heart sounds. PULM: no crackles, rhonchi. No retractions. No resp. distress. No accessory muscle use. Minimal wheezing bilaterally, breathing comfortably in room  ABD: S, NT, ND, +BS. No rebound. No HSM. EXTR: No c/c/e NEURO Normal gait.  PSYCH: Normally interactive. Conversant. Not depressed or anxious appearing.  Calm demeanor.  Overweight, looks well  Assessment and Plan: Asthma exacerbation, mild - Plan: fluticasone (FLOVENT HFA) 220 MCG/ACT inhaler, montelukast (SINGULAIR) 10 MG tablet  Type 2 diabetes mellitus with complication, without long-term current use of insulin (HCC) - Plan: Hemoglobin A1c  Essential hypertension  Here today to follow-up recent hospital admission due to asthma She finished her oral prednisone today and still has slight wheezing Prefer not to extend oral prednisone due to DM Will add flovent, singulair-  counseled that we hope she will just need these temporarily She is to seek care right away if not continuing to improve  Recheck A1c today Signed [name], MD  Results for orders placed or performed in visit on 08/25/16  Hemoglobin A1c  Result Value Ref Range   Hgb A1c MFr Bld 6.9 (H) 4.6 - 6.5 %   Message to pt

## 2016-08-26 ENCOUNTER — Encounter: Payer: Self-pay | Admitting: Family Medicine

## 2016-08-26 LAB — HEMOGLOBIN A1C: Hgb A1c MFr Bld: 6.9 % — ABNORMAL HIGH (ref 4.6–6.5)

## 2016-09-02 ENCOUNTER — Telehealth (HOSPITAL_COMMUNITY): Payer: Self-pay

## 2016-09-02 NOTE — Telephone Encounter (Signed)
Encounter complete. 

## 2016-09-07 ENCOUNTER — Ambulatory Visit (HOSPITAL_COMMUNITY)
Admission: RE | Admit: 2016-09-07 | Discharge: 2016-09-07 | Disposition: A | Discharge status: Home / Self Care | Payer: Medicare Other | Source: Ambulatory Visit | Attending: Cardiovascular Disease | Admitting: Cardiovascular Disease

## 2016-09-07 DIAGNOSIS — R079 Chest pain, unspecified: Secondary | ICD-10-CM

## 2016-09-07 DIAGNOSIS — R9431 Abnormal electrocardiogram [ECG] [EKG]: Secondary | ICD-10-CM | POA: Diagnosis not present

## 2016-09-07 LAB — MYOCARDIAL PERFUSION IMAGING
LV dias vol: 46 mL (ref 46–106)
LV sys vol: 15 mL
Peak HR: 111 {beats}/min
Rest HR: 64 {beats}/min
SDS: 2
SRS: 0
SSS: 2
TID: 0.94

## 2016-09-07 MED ORDER — TECHNETIUM TC 99M TETROFOSMIN IV KIT
10.1000 | PACK | Freq: Once | INTRAVENOUS | Status: AC | PRN
  Administered 2016-09-07: 10.1 via INTRAVENOUS
  Filled 2016-09-07: qty 11

## 2016-09-07 MED ORDER — TECHNETIUM TC 99M TETROFOSMIN IV KIT
31.8000 | PACK | Freq: Once | INTRAVENOUS | Status: AC | PRN
  Administered 2016-09-07: 31.8 via INTRAVENOUS
  Filled 2016-09-07: qty 32

## 2016-09-07 MED ORDER — REGADENOSON 0.4 MG/5ML IV SOLN
0.4000 mg | Freq: Once | INTRAVENOUS | Status: AC
Start: 2016-09-07 — End: 2016-09-07
  Administered 2016-09-07: 0.4 mg via INTRAVENOUS

## 2016-09-15 ENCOUNTER — Ambulatory Visit (INDEPENDENT_AMBULATORY_CARE_PROVIDER_SITE_OTHER): Payer: Medicare Other | Admitting: Nurse Practitioner

## 2016-09-15 ENCOUNTER — Encounter: Payer: Self-pay | Admitting: Nurse Practitioner

## 2016-09-15 VITALS — BP 126/70 | HR 63 | Ht 61.0 in | Wt 184.4 lb

## 2016-09-15 DIAGNOSIS — E782 Mixed hyperlipidemia: Secondary | ICD-10-CM | POA: Diagnosis not present

## 2016-09-15 DIAGNOSIS — R079 Chest pain, unspecified: Secondary | ICD-10-CM | POA: Diagnosis not present

## 2016-09-15 DIAGNOSIS — I10 Essential (primary) hypertension: Secondary | ICD-10-CM

## 2016-09-15 MED ORDER — FUROSEMIDE 20 MG PO TABS: ORAL_TABLET | ORAL | 3 refills | Status: DC

## 2016-09-15 NOTE — Patient Instructions (Addendum)
We will be checking the following labs today - NONE   Medication Instructions:    Continue with your current medicines. BUT  Take your Lasix just as needed    Testing/Procedures To Be Arranged:  N/A  Follow-Up:   See [place] back as needed.   Your stress test looked good - no further heart testing felt to be needed at this time.     Other Special Instructions:   N/A    If you need a refill on your cardiac medications before your next appointment, please call your pharmacy.   Call the [place] office at [phone] if you have any questions, problems or concerns.

## 2016-09-15 NOTE — Progress Notes (Signed)
CARDIOLOGY OFFICE NOTE  Date:  09/15/2016    Darci Needle Date of Birth: August 01, 1950 Medical Record #[id number]  PCP:  Darreld Mclean, MD  Cardiologist:  Gwenlyn Found, [facility]  Chief Complaint  Patient presents with  . Chest Pain    Post hospital visit - seen for Dr. Johnsie Cancel, Debara Pickett, Marlou Porch    History of Present Illness: Sherry [name] is a 66 y.o. female who presents today for a post hospital visit. Seen for Dr. Debara Pickett and Dr. Marlou Porch. She has apparently seen Dr. Johnsie Cancel in the past as well.   She has a a hx of cerebral aneurysm not coiled, recurrent TIA, DM type 2,  dyslipidemia, mitral valve prolapse (but not on last echo), HLD, CHF with normal EF on Echo, HTN, GERD, and HH.   Just seen last month by Dr. Debara Pickett for pre op clearance for knee surgery - she was cleared with no further work up felt to be needed from our standpoint.    Then got admitted just a few days later for the evaluation of CHF/chest pain in the setting of an acute asthma attack. Seen by Dr. Marlou Porch.  EKG showed more inferolateral T wave changes. Outpatient stress testing was recommended once her cough resolved - this was done and was noted to be low risk.   Comes in today. Here alone. She is feeling much better. Her biggest concern today is weight loss - tells me she weighed 228 18 months ago - now down to 184. I can see in EPIC where she weighed 213 back in May of 2015.  She is fatigued. Feels like passing out. Does not feel like she needs her Lasix anymore. Recent TSH was normal. She notes she is still having some issues with her breathing - but this is improving. No more chest pain whatsoever - reviewed her stress test with her - this was felt to be low risk.  She has seen her PCP since discharge and overall felt to be doing ok.   Past Medical History:  Diagnosis Date  . Cerebral aneurysm   . Complication of anesthesia HYPOTENSION INTRAOP W/ HYSTERECTOMY IN 1987--  DID OK W/ TOTAL KNEE IN 2008  .  Diabetes mellitus   . DJD (degenerative joint disease)   . Dyslipidemia   . GERD (gastroesophageal reflux disease)   . H/O hiatal hernia   . History of CHF (congestive heart failure) 2010-  RESOLVED  . History of peptic ulcer YRS AGO  . Hypertension   . Meniere's disease   . Mitral valve prolapse occasional palpitations w/ chest discomfort  . OA (osteoarthritis)   . Rotator cuff tear, left   . Stroke [facility])     Past Surgical History:  Procedure Laterality Date  . BUNIONECTOMY  2007   LEFT FOOT  . LEFT SHOULDER SURGERY  1997   ROTATOR CUFF REPAIR  . SHOULDER ARTHROSCOPY  09/20/2011   Procedure: ARTHROSCOPY SHOULDER;  Surgeon: Johnn Hai, MD;  Location: [facility];  Service: Orthopedics;  Laterality: Left;  SAD AND MINI OPEN ROTATOR CUFF REPAIR    . TOTAL KNEE ARTHROPLASTY  09-12-2006  [facility]   RIGHT KNEE  . TUBAL LIGATION  1976  . TYMPANOPLASTY  1990;   1980  . VAGINAL HYSTERECTOMY  1987     Medications: Current Meds  Medication Sig  . albuterol (PROVENTIL HFA;VENTOLIN HFA) 108 (90 Base) MCG/ACT inhaler Inhale 2 puffs into the lungs every 6 (six) hours as needed. For shortness  of breath.  Marland Kitchen albuterol (PROVENTIL) (2.5 MG/3ML) 0.083% nebulizer solution Take 3 mLs (2.5 mg total) by nebulization every 6 (six) hours as needed for wheezing or shortness of breath.  Marland Kitchen aspirin EC 325 MG tablet Take 1 tablet (325 mg total) by mouth daily.  . cetirizine (ZYRTEC) 10 MG tablet Take 1 tablet (10 mg total) by mouth daily.  . clopidogrel (PLAVIX) 75 MG tablet Take 1 tablet (75 mg total) by mouth daily.  Marland Kitchen docusate sodium (COLACE) 100 MG capsule Take 1 capsule (100 mg total) by mouth daily.  . empagliflozin (JARDIANCE) 10 MG TABS tablet Take 5 mg by mouth daily.  . famotidine (PEPCID) 10 MG tablet Take 10 mg by mouth daily.  . fluticasone (FLOVENT HFA) 220 MCG/ACT inhaler Inhale 1 puff into the lungs 2 (two) times daily. May increase to 2 puffs if needed  . furosemide  (LASIX) 20 MG tablet Take 1 tab daily prn swelling/weight gain.  Marland Kitchen gabapentin (NEURONTIN) 300 MG capsule Take 2 capsules (600 mg total) by mouth 2 (two) times daily.  Marland Kitchen HYDROcodone-acetaminophen (NORCO/VICODIN) 5-325 MG tablet Take 1 tablet by mouth every 4 (four) hours as needed.  Marland Kitchen ipratropium (ATROVENT) 0.03 % nasal spray Place 2 sprays into the nose 4 (four) times daily.  Marland Kitchen linagliptin (TRADJENTA) 5 MG TABS tablet Take 0.5 tablets (2.5 mg total) by mouth daily.  Marland Kitchen lisinopril (PRINIVIL,ZESTRIL) 20 MG tablet Take 1 tablet (20 mg total) by mouth daily.  Marland Kitchen LORazepam (ATIVAN) 1 MG tablet Take 1 tablet (1 mg total) by mouth 3 (three) times daily as needed for anxiety.  . metFORMIN (GLUCOPHAGE) 500 MG tablet Take 2 tablets (1,000 mg total) by mouth 2 (two) times daily. TAKE 2 TABLETS(1000 MG) BY MOUTH TWICE DAILY WITH A MEAL  . metoprolol tartrate (LOPRESSOR) 25 MG tablet Take 1 tablet (25 mg total) by mouth 2 (two) times daily.  . mometasone (ASMANEX) 220 MCG/INH inhaler Use 1 puff daily followed by rinsing your mouth out with water. (Patient taking differently: Inhale 1 puff into the lungs daily. \)  . montelukast (SINGULAIR) 10 MG tablet Take 1 tablet (10 mg total) by mouth at bedtime.  . predniSONE (DELTASONE) 10 MG tablet Prednisone 40 mg po daily x 1 day then Prednisone 30 mg po daily x 1 day then Prednisone 20 mg po daily x 1 day then Prednisone 10 mg daily x 1 day then stop...  . QUEtiapine (SEROQUEL) 100 MG tablet Take 0.5 tablets (50 mg total) by mouth at bedtime.  . sertraline (ZOLOFT) 50 MG tablet Take 0.5 tablets (25 mg total) by mouth at bedtime.  . [DISCONTINUED] furosemide (LASIX) 20 MG tablet Take 1 tab daily. (Patient taking differently: Take 20 mg by mouth daily. )   Current Facility-Administered Medications for the 09/15/16 encounter (Office Visit) with Burtis Junes, NP  Medication  . 0.9 %  sodium chloride infusion  . lidocaine (PF) (XYLOCAINE) 1 % injection 0.3 mL      Allergies: Allergies  Allergen Reactions  . Influenza Vaccines Anaphylaxis  . Codeine Itching  . Demerol Other (See Comments)    SEIZURE  . Nsaids Other (See Comments)    NAPROXEN, IBUPROFEN, SKELAXIN---  CAUSES PALPITATIONS  . Prednisone     Caused her glucose to go up to 500 and went to ED  . Tramadol     Contraindicated with Victoza     Social History: The patient  reports that she has never smoked. She has never used smokeless tobacco.  She reports that she does not drink alcohol or use drugs.   Family History: The patient's family history includes Aneurysm in her mother; Bipolar disorder in her son; Breast cancer in her maternal aunt, maternal aunt, maternal aunt, maternal grandmother, and mother; Congestive Heart Failure in her father; Diabetes in her father and mother; Heart attack in her sister; Heart disease in her mother and sister; Hypertension in her father and sister; Kidney disease in her mother; Lymphoma in her father; Other in her brother and son; Ovarian cancer in her mother; Stroke in her sister; Thyroid disease in her brother, mother, paternal grandmother, and sister.   Review of Systems: Please see the history of present illness.   Otherwise, the review of systems is positive for none.   All other systems are reviewed and negative.   Physical Exam: VS:  BP 126/70 (BP Location: Left Arm, Patient Position: Sitting, Cuff Size: Normal)   Pulse 63   Ht 5\' 1"  (1.549 m)   Wt 184 lb 6.4 oz (83.6 kg)   SpO2 100% Comment: at rest  BMI 34.84 kg/m  .  BMI Body mass index is 34.84 kg/m.  Wt Readings from Last 3 Encounters:  09/15/16 184 lb 6.4 oz (83.6 kg)  09/07/16 187 lb (84.8 kg)  08/25/16 187 lb (84.8 kg)    General: Pleasant. Obese black female who is alert and in no acute distress.  Noted to be 213 pounds in May of 2015.  HEENT: Normal.  Neck: Supple, no JVD, carotid bruits, or masses noted.  Cardiac: Regular rate and rhythm. No murmurs, rubs, or  gallops. No edema.  Respiratory:  Lungs are clear to auscultation bilaterally with normal work of breathing.  GI: Soft and nontender.  MS: No deformity or atrophy. Gait and ROM intact.  Skin: Warm and dry. Color is normal.  Neuro:  Strength and sensation are intact and no gross focal deficits noted.  Psych: Alert, appropriate and with normal affect.   LABORATORY DATA:  EKG:  EKG is not ordered today.  Lab Results  Component Value Date   WBC 4.8 08/20/2016   HGB 13.1 08/20/2016   HCT 42.5 08/20/2016   PLT 225 08/20/2016   GLUCOSE 154 (H) 08/20/2016   CHOL 114 08/20/2016   TRIG 58 08/20/2016   HDL 51 08/20/2016   LDLCALC 51 08/20/2016   ALT 11 (L) 08/20/2016   AST 16 08/20/2016   NA 139 08/20/2016   K 3.7 08/20/2016   CL 108 08/20/2016   CREATININE 0.61 08/20/2016   BUN 15 08/20/2016   CO2 25 08/20/2016   TSH 0.68 05/19/2016   INR 0.96 04/14/2015   HGBA1C 6.9 (H) 08/25/2016     BNP (last 3 results) No results for input(s): BNP in the last 8760 hours.  ProBNP (last 3 results) No results for input(s): PROBNP in the last 8760 hours.   Other Studies Reviewed Today:  Myoview Study Highlights from 08/2016    The left ventricular ejection fraction is hyperdynamic (>65%).  Nuclear stress EF: 68%.  There was no ST segment deviation noted during stress.  The study is normal.  This is a low risk study.   Low risk stress nuclear study with normal perfusion and normal left ventricular regional and global systolic function.     Echo Study Conclusions 04/2015  - Left ventricle: The cavity size was normal. Wall thickness was normal. Systolic function was normal. The estimated ejection fraction was in the range of 60% to 65%. Wall motion  was normal; there were no regional wall motion abnormalities.  Assessment/Plan:  1. SOB due to acute asthma exacerbation - improving - following by PCP.   2. Chest pain - in the setting of an asthma attack with  recent admission - Abnormal EKG noted - she has had low risk Myoview - no further testing felt to be needed at this time - would favor CV risk factor modification. She has had no more chest pain.   3.   HTN  - Controlled. On ACE, metoprolol. I don't think she needs her Lasix daily anymore - will change to prn.   4.   Hx of TIAs and cerebral aneurysm - unable to coil. She remains on high dose aspirin. Not prescribed by [place] - defer to neurology. Also on Plavix.   5. Weight loss - weight is down over the past 3 years - TSH is normal.   Current medicines are reviewed with the patient today.  The patient does not have concerns regarding medicines other than what has been noted above.  The following changes have been made:  See above.  Labs/ tests ordered today include:   No orders of the defined types were placed in this encounter.    Disposition:   FU here prn.   Patient is agreeable to this plan and will call if any problems develop in the interim.   SignedTruitt Merle, NP  09/15/2016 1:49 PM  [facility] [address] [place], [place]  [address] Phone: [phone] Fax: [phone]

## 2016-11-17 DIAGNOSIS — H40013 Open angle with borderline findings, low risk, bilateral: Secondary | ICD-10-CM | POA: Diagnosis not present

## 2016-11-19 ENCOUNTER — Ambulatory Visit: Payer: Medicare Other | Admitting: Endocrinology

## 2016-12-04 NOTE — Progress Notes (Addendum)
Subjective:    Sherry [name] is a 66 y.o. female who presents for a Welcome to Medicare exam.   Review of Systems Pt has noted some SOB and palpitations over the last week- since her mother died. She wonders if this might be due to anxiety   Her mother died a week ago  No fever or chills No chest pain or pressure  No nausea, vomiting or diarrhea No concerning skin lesions         Objective:    Today's Vitals   12/06/16 1149 12/06/16 1237  BP: (!) 147/80 138/80  Pulse: (!) 57   Temp: 97.7 F (36.5 C)   TempSrc: Oral   SpO2: 99%   Weight: 192 lb 3.2 oz (87.2 kg)   Height: 5\' 1"  (1.549 m)   Body mass index is 36.32 kg/m.  Medications Outpatient Encounter Prescriptions as of 12/06/2016  Medication Sig  . albuterol (PROVENTIL HFA;VENTOLIN HFA) 108 (90 Base) MCG/ACT inhaler Inhale 2 puffs into the lungs every 6 (six) hours as needed. For shortness of breath.  Marland Kitchen albuterol (PROVENTIL) (2.5 MG/3ML) 0.083% nebulizer solution Take 3 mLs (2.5 mg total) by nebulization every 6 (six) hours as needed for wheezing or shortness of breath.  Marland Kitchen aspirin EC 325 MG tablet Take 1 tablet (325 mg total) by mouth daily.  . cetirizine (ZYRTEC) 10 MG tablet Take 1 tablet (10 mg total) by mouth daily.  . clopidogrel (PLAVIX) 75 MG tablet Take 1 tablet (75 mg total) by mouth daily.  Marland Kitchen docusate sodium (COLACE) 100 MG capsule Take 1 capsule (100 mg total) by mouth daily.  . empagliflozin (JARDIANCE) 10 MG TABS tablet Take 5 mg by mouth daily.  . famotidine (PEPCID) 10 MG tablet Take 10 mg by mouth daily.  . fluticasone (FLOVENT HFA) 220 MCG/ACT inhaler Inhale 1 puff into the lungs 2 (two) times daily. May increase to 2 puffs if needed  . furosemide (LASIX) 20 MG tablet Take 1 tab daily prn swelling/weight gain.  Marland Kitchen gabapentin (NEURONTIN) 300 MG capsule Take 2 capsules (600 mg total) by mouth 2 (two) times daily.  Marland Kitchen HYDROcodone-acetaminophen (NORCO/VICODIN) 5-325 MG tablet Take 1 tablet by mouth  every 4 (four) hours as needed.  Marland Kitchen ipratropium (ATROVENT) 0.03 % nasal spray Place 2 sprays into the nose 4 (four) times daily.  Marland Kitchen linagliptin (TRADJENTA) 5 MG TABS tablet Take 0.5 tablets (2.5 mg total) by mouth daily.  Marland Kitchen lisinopril (PRINIVIL,ZESTRIL) 20 MG tablet Take 1 tablet (20 mg total) by mouth daily.  Marland Kitchen LORazepam (ATIVAN) 1 MG tablet Take 1 tablet (1 mg total) by mouth 3 (three) times daily as needed for anxiety.  . metFORMIN (GLUCOPHAGE) 500 MG tablet Take 2 tablets (1,000 mg total) by mouth 2 (two) times daily. TAKE 2 TABLETS(1000 MG) BY MOUTH TWICE DAILY WITH A MEAL  . metoprolol tartrate (LOPRESSOR) 25 MG tablet Take 1 tablet (25 mg total) by mouth 2 (two) times daily.  . mometasone (ASMANEX) 220 MCG/INH inhaler Use 1 puff daily followed by rinsing your mouth out with water. (Patient taking differently: Inhale 1 puff into the lungs daily. \)  . montelukast (SINGULAIR) 10 MG tablet Take 1 tablet (10 mg total) by mouth at bedtime.  . predniSONE (DELTASONE) 10 MG tablet Prednisone 40 mg po daily x 1 day then Prednisone 30 mg po daily x 1 day then Prednisone 20 mg po daily x 1 day then Prednisone 10 mg daily x 1 day then stop...  . QUEtiapine (SEROQUEL) 100  MG tablet Take 0.5 tablets (50 mg total) by mouth at bedtime.  . sertraline (ZOLOFT) 50 MG tablet Take 0.5 tablets (25 mg total) by mouth at bedtime.  . [DISCONTINUED] montelukast (SINGULAIR) 10 MG tablet Take 1 tablet (10 mg total) by mouth at bedtime.   Facility-Administered Encounter Medications as of 12/06/2016  Medication  . 0.9 %  sodium chloride infusion  . lidocaine (PF) (XYLOCAINE) 1 % injection 0.3 mL     History: Past Medical History:  Diagnosis Date  . Cerebral aneurysm   . Complication of anesthesia HYPOTENSION INTRAOP W/ HYSTERECTOMY IN 1987--  DID OK W/ TOTAL KNEE IN 2008  . Diabetes mellitus   . DJD (degenerative joint disease)   . Dyslipidemia   . GERD (gastroesophageal reflux disease)   . H/O hiatal hernia    . History of CHF (congestive heart failure) 2010-  RESOLVED  . History of peptic ulcer YRS AGO  . Hypertension   . Meniere's disease   . Mitral valve prolapse occasional palpitations w/ chest discomfort  . OA (osteoarthritis)   . Rotator cuff tear, left   . Stroke [facility])    Past Surgical History:  Procedure Laterality Date  . BUNIONECTOMY  2007   LEFT FOOT  . LEFT SHOULDER SURGERY  1997   ROTATOR CUFF REPAIR  . SHOULDER ARTHROSCOPY  09/20/2011   Procedure: ARTHROSCOPY SHOULDER;  Surgeon: Johnn Hai, MD;  Location[facility];  Service: Orthopedics;  Laterality: Left;  SAD AND MINI OPEN ROTATOR CUFF REPAIR    . TOTAL KNEE ARTHROPLASTY  09-12-2006  [facility]   RIGHT KNEE  . TUBAL LIGATION  1976  . TYMPANOPLASTY  1990;   1980  . VAGINAL HYSTERECTOMY  1987    Family History  Problem Relation Age of Onset  . Heart disease Mother        in her 56s, heart attack  . Diabetes Mother   . Kidney disease Mother        dialysis  . Thyroid disease Mother   . Aneurysm Mother   . Ovarian cancer Mother   . Breast cancer Mother   . Thyroid disease Sister   . Heart disease Sister   . Lymphoma Father   . Diabetes Father   . Hypertension Father   . Congestive Heart Failure Father   . Thyroid disease Brother   . Thyroid disease Paternal Grandmother   . Heart attack Sister        at age 26  . Stroke Sister   . Hypertension Sister   . Breast cancer Maternal Aunt   . Breast cancer Maternal Grandmother   . Breast cancer Maternal Aunt   . Breast cancer Maternal Aunt   . Other Brother        Musician accident  . Bipolar disorder Son   . Other Son        GSW   Social History   Occupational History  . LPN - retired    Social History Main Topics  . Smoking status: Never Smoker  . Smokeless tobacco: Never Used  . Alcohol use No  . Drug use: No  . Sexual activity: Not on file    Tobacco Counseling Counseling given: No  Never a smoker Immunizations and Health  Maintenance Immunization History  Administered Date(s) Administered  . PPD Test 06/29/2013, 02/18/2015  . Pneumococcal Conjugate-13 12/06/2016   Health Maintenance Due  Topic Date Due  . HIV Screening  01/07/1966  . TETANUS/TDAP  01/07/1970  .  PAP SMEAR  01/08/1972  . PNA vac Low Risk Adult (1 of 2 - PCV13) 01/08/2016  . INFLUENZA VACCINE  09/08/2016    Activities of Daily Living In your present state of health, do you have any difficulty performing the following activities: 08/20/2016  Hearing? N  Vision? N  Difficulty concentrating or making decisions? N  Walking or climbing stairs? Y  Dressing or bathing? N  Doing errands, shopping? N  Some recent data might be hidden   Pt notes that she wears readers- rx- but needs to get bifocals.  She plans to do so She notes that she has lost her balance and fallen a few times, over the last 6 months.  She has fallen maybe 4 times  She is "not sure, I just feel unbalanced."  She has not noted any weakness or numbness except her left face "feels like it's drooping but it's not when I look at it."  She has noted this for 2 months She did have a headache about 2 weeks ago  Physical Exam  GEN: WDWN, NAD, Non-toxic, A & O x 3 HEENT: Atraumatic, Normocephalic. Neck supple. No masses, No LAD. Ears and Nose: No external deformity. CV: RRR, No M/G/R. No JVD. No thrill. No extra heart sounds. PULM: CTA B, no wheezes, crackles, rhonchi. No retractions. No resp. distress. No accessory muscle use. ABD: S, NT, ND, +BS. No rebound. No HSM. EXTR: No c/c/e NEURO Normal gait.  PSYCH: Normally interactive. Conversant. Not depressed or anxious appearing.  Calm demeanor.   EKG: Sinus bradycardia with non- specific t changes Advanced Directives: discussed with pt.  She has recently completed advanced directed with her lawyer      Assessment:    This is a routine wellness examination for this patient .   Vision/Hearing screen No exam data present    Pt is actively seeing her eye doctor and having adjustments to her lenses performed  Dietary issues and exercise activities discussed:  Encouraged healthy diet and weight loss    Goals    None     Depression Screen PHQ 2/9 Scores 12/06/2016 02/18/2015 11/20/2014 11/11/2014  PHQ - 2 Score 2 6 6  0  PHQ- 9 Score 8 21 10  -  Exception Documentation Other- indicate reason in comment box - - -  Not completed Loss of loved ones - - -     Fall Risk Fall Risk  12/06/2016  Falls in the past year? Yes  Number falls in past yr: 1  Injury with Fall? Yes    Cognitive Function: no particular changes, although she does admit to feeling less confident going out alone due to concerns about falling  Patient Care Team: Kashius Dominic, Gay Filler, MD as PCP - General (Family Medicine)     Plan:    Welcome to Medicare preventive visit  Palpitations - Plan: EKG 12-Lead  Immunization due - Plan: Pneumococcal conjugate vaccine 13-valent IM  Frequent falls - Plan: Ambulatory referral to Neurology  Type 2 diabetes mellitus with complication, without long-term current use of insulin (Central) - Plan: Hemoglobin A1c  Essential hypertension  Physical exam  Asthma exacerbation, mild - Plan: montelukast (SINGULAIR) 10 MG tablet  Hypercalcemia - Plan: Basic metabolic panel  Here today for her welcome to medicare visit She has fallen several times recently.  Past history of TIA a year ago, and feels that her left face is drooping although it is not visually.  Referral to neurology for further evaluation BP is under ok control  Refilled her Singulair today Check BMP due to recent mild hypercalcemia Concern of palpations recently EKG is reassuring, SR. May be due to stress, recent loss of her mom.  She will let me know if this continues  She was admitted and evaluated for chest pain in August, had a low risk myoview  I have personally reviewed and noted the following in the patient's chart:   . Medical and  social history . Use of alcohol, tobacco or illicit drugs  . Current medications and supplements . Functional ability and status . Nutritional status . Physical activity . Advanced directives . List of other physicians . Hospitalizations, surgeries, and ER visits in previous 12 months . Vitals . Screenings to include cognitive, depression, and falls . Referrals and appointments  In addition, I have reviewed and discussed with patient certain preventive protocols, quality metrics, and best practice recommendations. A written personalized care plan for preventive services as well as general preventive health recommendations were provided to patient.     [name], MD 12/06/2016

## 2016-12-06 ENCOUNTER — Ambulatory Visit (INDEPENDENT_AMBULATORY_CARE_PROVIDER_SITE_OTHER): Payer: Medicare Other | Admitting: Family Medicine

## 2016-12-06 ENCOUNTER — Encounter: Payer: Self-pay | Admitting: Family Medicine

## 2016-12-06 ENCOUNTER — Other Ambulatory Visit: Payer: Self-pay | Admitting: Family Medicine

## 2016-12-06 VITALS — BP 138/80 | HR 57 | Temp 97.7°F | Ht 61.0 in | Wt 192.2 lb

## 2016-12-06 DIAGNOSIS — I10 Essential (primary) hypertension: Secondary | ICD-10-CM | POA: Diagnosis not present

## 2016-12-06 DIAGNOSIS — Z23 Encounter for immunization: Secondary | ICD-10-CM | POA: Diagnosis not present

## 2016-12-06 DIAGNOSIS — R002 Palpitations: Secondary | ICD-10-CM

## 2016-12-06 DIAGNOSIS — E118 Type 2 diabetes mellitus with unspecified complications: Secondary | ICD-10-CM

## 2016-12-06 DIAGNOSIS — Z0000 Encounter for general adult medical examination without abnormal findings: Secondary | ICD-10-CM

## 2016-12-06 DIAGNOSIS — J45901 Unspecified asthma with (acute) exacerbation: Secondary | ICD-10-CM | POA: Diagnosis not present

## 2016-12-06 DIAGNOSIS — R296 Repeated falls: Secondary | ICD-10-CM | POA: Diagnosis not present

## 2016-12-06 LAB — BASIC METABOLIC PANEL
BUN: 14 mg/dL (ref 6–23)
CO2: 29 mEq/L (ref 19–32)
Calcium: 9.2 mg/dL (ref 8.4–10.5)
Chloride: 104 mEq/L (ref 96–112)
Creatinine, Ser: 0.59 mg/dL (ref 0.40–1.20)
GFR: 131.19 mL/min (ref >60.00)
Glucose, Bld: 91 mg/dL (ref 70–99)
Potassium: 3.6 mEq/L (ref 3.5–5.1)
Sodium: 141 mEq/L (ref 135–145)

## 2016-12-06 LAB — HEMOGLOBIN A1C: Hgb A1c MFr Bld: 6.3 % (ref 4.6–6.5)

## 2016-12-06 MED ORDER — MONTELUKAST SODIUM 10 MG PO TABS: 10.0000 mg | ORAL_TABLET | Freq: Every day | ORAL | 3 refills | Status: DC

## 2016-12-06 MED FILL — MONTELUKAST SOD 10 MG TAB: 10 | 30 days supply | Qty: 30 | Fill #0

## 2016-12-06 NOTE — Telephone Encounter (Signed)
Caller name:Jessica- [employer] Relation to pt: Call back number:[phone] [facility]acy:[facility]  Reason for call: pt is needing an alternative rx for albuterol (PROVENTIL HFA;VENTOLIN HFA) 108 (90 Base) MCG/ACT inhaler, states the covered alternative would be pro-air. Please send new rx for pro air

## 2016-12-06 NOTE — Patient Instructions (Addendum)
  Sherry [name] , Thank you for taking time to come for your Medicare Wellness Visit. I appreciate your ongoing commitment to your health goals. Please review the following plan we discussed and let me know if I can assist you in the future.     These are the goals we discussed: see neurology and work on balance  Goals    None      This is a list of the screening recommended for you and due dates:  Health Maintenance  Topic Date Due  . HIV Screening  01/07/1966  . Tetanus Vaccine  01/07/1970  . Pap Smear  01/08/1972  . Eye exam for diabetics  02/22/2015  . Pneumonia vaccines (1 of 2 - PCV13) 01/08/2016  . Flu Shot  09/08/2016  . Hemoglobin A1C  02/25/2017  . Complete foot exam   05/19/2017  . Mammogram  07/16/2017  . Colon Cancer Screening  03/05/2026  . DEXA scan (bone density measurement)  Completed  .  Hepatitis C: One time screening is recommended by Center for Disease Control  (CDC) for  adults born from [birth year] through 1965.   Completed

## 2016-12-07 ENCOUNTER — Encounter: Payer: Self-pay | Admitting: Family Medicine

## 2016-12-07 MED ORDER — ALBUTEROL SULFATE HFA 108 (90 BASE) MCG/ACT IN AERS: 1.0000 | INHALATION_SPRAY | Freq: Four times a day (QID) | RESPIRATORY_TRACT | 6 refills | Status: DC | PRN

## 2016-12-08 ENCOUNTER — Telehealth: Payer: Self-pay

## 2016-12-08 ENCOUNTER — Telehealth: Payer: Self-pay | Admitting: *Deleted

## 2016-12-08 MED ORDER — GLUCOSE BLOOD VI STRP: ORAL_STRIP | 12 refills | Status: DC

## 2016-12-08 MED ORDER — LANCETS MISC: 10 refills | Status: DC

## 2016-12-08 NOTE — Telephone Encounter (Signed)
Aaron Edelman from [employer] is calling to get verbal auth form the patient to get a refill of her One touch Accu Chek Avvia Test Strips  . Contact number [phone] reference  [id number]. Please advise. Thank you

## 2016-12-08 NOTE — Telephone Encounter (Signed)
I called pharmacy & I have just sent over prescriptions electronically.

## 2016-12-09 NOTE — Telephone Encounter (Signed)
Called [facility] approving the lancing device.

## 2016-12-09 NOTE — Telephone Encounter (Signed)
[facility] is calling for verbal on the lancets for the accucheck [phone]

## 2017-01-14 ENCOUNTER — Telehealth: Payer: Self-pay | Admitting: Family Medicine

## 2017-01-14 NOTE — Telephone Encounter (Signed)
Please advise 

## 2017-01-14 NOTE — Telephone Encounter (Signed)
Copied from [place] [id number]. Topic: Quick Communication - See Telephone Encounter >> Jan 14, 2017  1:31 PM Arletha Grippe wrote: CRM for notification. See Telephone encounter for:   01/14/17.[place] called from [facility] - would like provider to re eval pr to see if statin therapy is appropriate. Please call [phone]

## 2017-01-24 ENCOUNTER — Telehealth: Payer: Self-pay

## 2017-01-24 NOTE — Telephone Encounter (Signed)
   [facility] Pre-operative Risk Assessment    Request for surgical clearance:  1. What type of surgery is being performed? L total knee arthoplasty   2. When is this surgery scheduled? pending   3. Are there any medications that need to be held prior to surgery and how long?  N/A  4. Practice name and name of physician performing surgery? [facility] - Dr Rodell Perna   5. What is your office phone and fax number? Ph: [phone], F: [place]   6. Anesthesia type (None, local, MAC, general) ? unknown   [place], [place] 01/24/2017, 4:16 PM  _________________________________________________________________   (provider comments below)

## 2017-01-25 NOTE — Telephone Encounter (Signed)
   Primary Cardiologist: Pixie Casino, MD  Chart reviewed as part of pre-operative protocol coverage. Given past medical history and time since last visit, based on ACC/AHA guidelines, [place] Basista would be at acceptable risk for the planned procedure without further cardiovascular testing (recent low risk stress test in 09/2016).  Of note, pt is on ASA and Plavix in the setting of prior h/o TIA and cerebral aneurysm.  She is followed by neurology.  We would defer to neurology with regards to holding these medications perioperatively, if necessary.  Please call with questions.  Murray Hodgkins, NP 01/25/2017, 3:35 PM

## 2017-01-27 ENCOUNTER — Telehealth: Payer: Self-pay | Admitting: Family Medicine

## 2017-01-27 NOTE — Telephone Encounter (Signed)
Copied from [place]. Topic: Quick Communication - See Telephone Encounter >> Jan 27, 2017  5:16 PM Cleaster Corin, NT wrote: CRM for notification. See Telephone encounter for:   01/27/17. Matt calling from [employer] asking if clinic notes can be faxed over in order for pt To receive back brace. Fax number [phone]

## 2017-01-28 NOTE — Telephone Encounter (Signed)
That is fine, can fax notes if pt approves

## 2017-01-28 NOTE — Telephone Encounter (Signed)
[employer] wants office note for reason for order please advise.

## 2017-01-31 ENCOUNTER — Telehealth: Payer: Self-pay | Admitting: *Deleted

## 2017-01-31 DIAGNOSIS — M545 Low back pain: Secondary | ICD-10-CM | POA: Diagnosis not present

## 2017-01-31 NOTE — Telephone Encounter (Signed)
Received request for OV notes for Detailed Written order received by [employer]; forwarded via fax/SLS 12/24

## 2017-02-02 ENCOUNTER — Ambulatory Visit: Payer: Medicare Other | Admitting: Family Medicine

## 2017-02-02 ENCOUNTER — Ambulatory Visit (HOSPITAL_BASED_OUTPATIENT_CLINIC_OR_DEPARTMENT_OTHER)
Admission: RE | Admit: 2017-02-02 | Discharge: 2017-02-02 | Disposition: A | Discharge status: Home / Self Care | Payer: Medicare Other | Source: Ambulatory Visit | Attending: Family Medicine | Admitting: Family Medicine

## 2017-02-02 ENCOUNTER — Ambulatory Visit: Payer: Medicaid Other | Admitting: Family Medicine

## 2017-02-02 ENCOUNTER — Encounter: Payer: Self-pay | Admitting: Family Medicine

## 2017-02-02 VITALS — BP 120/58 | HR 68 | Temp 98.3°F | Resp 16 | Ht 61.0 in | Wt 190.0 lb

## 2017-02-02 DIAGNOSIS — M5134 Other intervertebral disc degeneration, thoracic region: Secondary | ICD-10-CM

## 2017-02-02 DIAGNOSIS — M546 Pain in thoracic spine: Secondary | ICD-10-CM

## 2017-02-02 DIAGNOSIS — M4604 Spinal enthesopathy, thoracic region: Secondary | ICD-10-CM | POA: Insufficient documentation

## 2017-02-02 DIAGNOSIS — M4804 Spinal stenosis, thoracic region: Secondary | ICD-10-CM | POA: Insufficient documentation

## 2017-02-02 MED FILL — MONTELUKAST SOD 10 MG TAB: 10 | 30 days supply | Qty: 30 | Fill #1

## 2017-02-02 NOTE — Progress Notes (Deleted)
[place] at [facility] [address], [place], [place] [address] [phone]  Date:  02/02/2017   Name:  Sherry [name]   DOB:  Feb 27, 1950   MRN:  [id number]  PCP:  Darreld Mclean, MD    Chief Complaint: No chief complaint on file.   History of Present Illness:  Sherry [name] is a 66 y.o. very pleasant female patient who presents with the following:  ***  Patient Active Problem List   Diagnosis Date Noted  . Acute asthma exacerbation 08/19/2016  . Preoperative cardiovascular examination 08/16/2016  . Cervicalgia 03/23/2016  . Acute back pain with sciatica, right 01/14/2016  . Vertigo 04/14/2015  . Anxiety state 01/22/2015  . HLD (hyperlipidemia) 01/22/2015  . Type 2 diabetes mellitus with circulatory disorder ([place]) 01/22/2015  . Intracranial vascular stenosis 01/22/2015  . Aneurysm ([place])   . Headache   . Diabetes mellitus with complication ([place]) 69/62/9528  . Essential hypertension 11/12/2014  . Dependent edema 11/12/2014  . Peripheral neuropathy 11/12/2014  . Depression 11/12/2014  . Facial droop   . Chest pain   . TIA (transient ischemic attack) 11/11/2014  . Dyspnea 08/06/2014  . Hyperthyroidism 08/06/2014  . Meniere's disease 06/07/2014  . History of CHF (congestive heart failure) 06/07/2014  . Essential hypertension, benign 06/13/2013  . Goiter 06/13/2013  . Obesity, unspecified 06/13/2013    Past Medical History:  Diagnosis Date  . Cerebral aneurysm   . Complication of anesthesia HYPOTENSION INTRAOP W/ HYSTERECTOMY IN 1987--  DID OK W/ TOTAL KNEE IN 2008  . Diabetes mellitus   . DJD (degenerative joint disease)   . Dyslipidemia   . GERD (gastroesophageal reflux disease)   . H/O hiatal hernia   . History of CHF (congestive heart failure) 2010-  RESOLVED  . History of peptic ulcer YRS AGO  . Hypertension   . Meniere's disease   . Mitral valve prolapse occasional palpitations w/ chest  discomfort  . OA (osteoarthritis)   . Rotator cuff tear, left   . Stroke [facility])     Past Surgical History:  Procedure Laterality Date  . BUNIONECTOMY  2007   LEFT FOOT  . LEFT SHOULDER SURGERY  1997   ROTATOR CUFF REPAIR  . SHOULDER ARTHROSCOPY  09/20/2011   Procedure: ARTHROSCOPY SHOULDER;  Surgeon: Johnn Hai, MD;  Location: [facility];  Service: Orthopedics;  Laterality: Left;  SAD AND MINI OPEN ROTATOR CUFF REPAIR    . TOTAL KNEE ARTHROPLASTY  09-12-2006  [facility]   RIGHT KNEE  . TUBAL LIGATION  1976  . TYMPANOPLASTY  1990;   1980  . VAGINAL HYSTERECTOMY  1987    Social History   Tobacco Use  . Smoking status: Never Smoker  . Smokeless tobacco: Never Used  Substance Use Topics  . Alcohol use: No  . Drug use: No    Family History  Problem Relation Age of Onset  . Heart disease Mother        in her 48s, heart attack  . Diabetes Mother   . Kidney disease Mother        dialysis  . Thyroid disease Mother   . Aneurysm Mother   . Ovarian cancer Mother   . Breast cancer Mother   . Thyroid disease Sister   . Heart disease Sister   . Lymphoma Father   . Diabetes Father   . Hypertension Father   . Congestive Heart Failure Father   .  Thyroid disease Brother   . Thyroid disease Paternal Grandmother   . Heart attack Sister        at age 33  . Stroke Sister   . Hypertension Sister   . Breast cancer Maternal Aunt   . Breast cancer Maternal Grandmother   . Breast cancer Maternal Aunt   . Breast cancer Maternal Aunt   . Other Brother        Musician accident  . Bipolar disorder Son   . Other Son        GSW    Allergies  Allergen Reactions  . Influenza Vaccines Anaphylaxis  . Codeine Itching  . Demerol Other (See Comments)    SEIZURE  . Influenza Vac Split Quad   . Nsaids Other (See Comments)    NAPROXEN, IBUPROFEN, SKELAXIN---  CAUSES PALPITATIONS  . Prednisone     Caused her glucose to go up to 500 and went to ED  . Tramadol      Contraindicated with Victoza     Medication list has been reviewed and updated.  Current Outpatient Medications on File Prior to Visit  Medication Sig Dispense Refill  . albuterol (PROAIR HFA) 108 (90 Base) MCG/ACT inhaler Inhale 1-2 puffs into the lungs every 6 (six) hours as needed for wheezing or shortness of breath. 18 g 6  . albuterol (PROVENTIL HFA;VENTOLIN HFA) 108 (90 Base) MCG/ACT inhaler Inhale 2 puffs into the lungs every 6 (six) hours as needed. For shortness of breath. 18 g 2  . albuterol (PROVENTIL) (2.5 MG/3ML) 0.083% nebulizer solution Take 3 mLs (2.5 mg total) by nebulization every 6 (six) hours as needed for wheezing or shortness of breath. 150 mL 1  . aspirin EC 325 MG tablet Take 1 tablet (325 mg total) by mouth daily. 90 tablet 1  . cetirizine (ZYRTEC) 10 MG tablet Take 1 tablet (10 mg total) by mouth daily. 90 tablet 3  . clopidogrel (PLAVIX) 75 MG tablet Take 1 tablet (75 mg total) by mouth daily. 90 tablet 3  . docusate sodium (COLACE) 100 MG capsule Take 1 capsule (100 mg total) by mouth daily. 10 capsule 2  . empagliflozin (JARDIANCE) 10 MG TABS tablet Take 5 mg by mouth daily. 45 tablet 3  . famotidine (PEPCID) 10 MG tablet Take 10 mg by mouth daily.    . fluticasone (FLOVENT HFA) 220 MCG/ACT inhaler Inhale 1 puff into the lungs 2 (two) times daily. May increase to 2 puffs if needed 1 Inhaler 12  . furosemide (LASIX) 20 MG tablet Take 1 tab daily prn swelling/weight gain. 90 tablet 3  . gabapentin (NEURONTIN) 300 MG capsule Take 2 capsules (600 mg total) by mouth 2 (two) times daily. 360 capsule 1  . glucose blood (ACCU-CHEK AVIVA) test strip Use as instructed 100 each 12  . HYDROcodone-acetaminophen (NORCO/VICODIN) 5-325 MG tablet Take 1 tablet by mouth every 4 (four) hours as needed. 10 tablet 0  . ipratropium (ATROVENT) 0.03 % nasal spray Place 2 sprays into the nose 4 (four) times daily. 30 mL 6  . Lancets MISC Used to check blood sugars daily 100 each 10  .  linagliptin (TRADJENTA) 5 MG TABS tablet Take 0.5 tablets (2.5 mg total) by mouth daily. 45 tablet 3  . lisinopril (PRINIVIL,ZESTRIL) 20 MG tablet Take 1 tablet (20 mg total) by mouth daily. 90 tablet 3  . LORazepam (ATIVAN) 1 MG tablet Take 1 tablet (1 mg total) by mouth 3 (three) times daily as needed for anxiety. 15  tablet 0  . metFORMIN (GLUCOPHAGE) 500 MG tablet Take 2 tablets (1,000 mg total) by mouth 2 (two) times daily. TAKE 2 TABLETS(1000 MG) BY MOUTH TWICE DAILY WITH A MEAL 360 tablet 3  . metoprolol tartrate (LOPRESSOR) 25 MG tablet Take 1 tablet (25 mg total) by mouth 2 (two) times daily. 180 tablet 3  . mometasone (ASMANEX) 220 MCG/INH inhaler Use 1 puff daily followed by rinsing your mouth out with water. (Patient taking differently: Inhale 1 puff into the lungs daily. \) 1 Inhaler 12  . montelukast (SINGULAIR) 10 MG tablet Take 1 tablet (10 mg total) by mouth at bedtime. 30 tablet 3  . predniSONE (DELTASONE) 10 MG tablet Prednisone 40 mg po daily x 1 day then Prednisone 30 mg po daily x 1 day then Prednisone 20 mg po daily x 1 day then Prednisone 10 mg daily x 1 day then stop... 10 tablet 0  . QUEtiapine (SEROQUEL) 100 MG tablet Take 0.5 tablets (50 mg total) by mouth at bedtime. 45 tablet 3  . sertraline (ZOLOFT) 50 MG tablet Take 0.5 tablets (25 mg total) by mouth at bedtime. 45 tablet 3   No current facility-administered medications on file prior to visit.     Review of Systems:  ***  Physical Examination: There were no vitals filed for this visit. There were no vitals filed for this visit. There is no height or weight on file to calculate BMI. Ideal Body Weight:    ***  Assessment and Plan: ***  Signed [name], MD

## 2017-02-02 NOTE — Patient Instructions (Addendum)
You do have degenerative change in your thoracic spine. I am going to refer you to one of Dr. Randel Pigg partners who does spine work  In the meantime, please increase your gabapentin dose.  Add 1 pill at mid-day, and after a week or so increse this to 2 pills. This will have you taking a total of 6 pills daily  Once we see how this works for you I can update your rx at the pharmacy to reflect a higher number of pills

## 2017-02-02 NOTE — Progress Notes (Signed)
[place] at [facility] [address], [place], [place] [address] [phone]  Date:  02/02/2017   Name:  Sherry [name]   DOB:  1950/07/08   MRN:  [id number]  PCP:  Darreld Mclean, MD    Chief Complaint: No chief complaint on file.   History of Present Illness:  Sherry [name] is a 66 y.o. very pleasant female patient who presents with the following:  Here today to follow-up on back pain. She feels like she wants to hunch over when she is walking, and that she gets stiff easily She has noted this for the last year or so, but seems worse over the last couple of months.  She did see Dr. Marlou Sa about a year ago and had an MRI of her lumbar spine However pt reports that "I started having stroke symptoms in his office so I got send to [place] and did not follow-up"   IMPRESSION: 1. Lower thoracic and lumbar spondylosis and degenerative disc disease, resulting in mild impingement at T10-11, L1-2, L4-5, and L5-S1, as detailed above. 2. There is facet edema at L5-S1, especially at the base of the superior articular facet of S1 where the degree of edema may rise to the level of being stress reaction or early stress fracture.  For her back, she may take tylenol which helps a bit with her muscle pain She is still taking her Neurontin 600 mg BID She is not eager to add any new medications to her regiemn The pain and numbness do not go down into her legs at this time.  However, she is now having some intermittent numbness in her hands and arms, bilaterally.   She also notes that her hands may seem to go numb if she twists her trunk a certain way, but will go away if she repositions This has been the case for about 2 months.    Her current pain is more in her mid to upper spine as opposed to her lowe rback No fall or other recent injury   She is otherwise feeling ok   BP Readings from Last 3 Encounters:  02/02/17 (!) 120/58   12/06/16 138/80  09/15/16 126/70     Patient Active Problem List   Diagnosis Date Noted  . Acute asthma exacerbation 08/19/2016  . Preoperative cardiovascular examination 08/16/2016  . Cervicalgia 03/23/2016  . Acute back pain with sciatica, right 01/14/2016  . Vertigo 04/14/2015  . Anxiety state 01/22/2015  . HLD (hyperlipidemia) 01/22/2015  . Type 2 diabetes mellitus with circulatory disorder ([place]) 01/22/2015  . Intracranial vascular stenosis 01/22/2015  . Aneurysm ([place])   . Headache   . Diabetes mellitus with complication ([place]) 51/88/4166  . Essential hypertension 11/12/2014  . Dependent edema 11/12/2014  . Peripheral neuropathy 11/12/2014  . Depression 11/12/2014  . Facial droop   . Chest pain   . TIA (transient ischemic attack) 11/11/2014  . Dyspnea 08/06/2014  . Hyperthyroidism 08/06/2014  . Meniere's disease 06/07/2014  . History of CHF (congestive heart failure) 06/07/2014  . Essential hypertension, benign 06/13/2013  . Goiter 06/13/2013  . Obesity, unspecified 06/13/2013    Past Medical History:  Diagnosis Date  . Cerebral aneurysm   . Complication of anesthesia HYPOTENSION INTRAOP W/ HYSTERECTOMY IN 1987--  DID OK W/ TOTAL KNEE IN 2008  . Diabetes mellitus   . DJD (degenerative joint disease)   . Dyslipidemia   . GERD (gastroesophageal reflux disease)   .  H/O hiatal hernia   . History of CHF (congestive heart failure) 2010-  RESOLVED  . History of peptic ulcer YRS AGO  . Hypertension   . Meniere's disease   . Mitral valve prolapse occasional palpitations w/ chest discomfort  . OA (osteoarthritis)   . Rotator cuff tear, left   . Stroke [facility])     Past Surgical History:  Procedure Laterality Date  . BUNIONECTOMY  2007   LEFT FOOT  . LEFT SHOULDER SURGERY  1997   ROTATOR CUFF REPAIR  . SHOULDER ARTHROSCOPY  09/20/2011   Procedure: ARTHROSCOPY SHOULDER;  Surgeon: Johnn Hai, MD;  Location: [facility];  Service: Orthopedics;   Laterality: Left;  SAD AND MINI OPEN ROTATOR CUFF REPAIR    . TOTAL KNEE ARTHROPLASTY  09-12-2006  [facility]   RIGHT KNEE  . TUBAL LIGATION  1976  . TYMPANOPLASTY  1990;   1980  . VAGINAL HYSTERECTOMY  1987    Social History   Tobacco Use  . Smoking status: Never Smoker  . Smokeless tobacco: Never Used  Substance Use Topics  . Alcohol use: No  . Drug use: No    Family History  Problem Relation Age of Onset  . Heart disease Mother        in her 38s, heart attack  . Diabetes Mother   . Kidney disease Mother        dialysis  . Thyroid disease Mother   . Aneurysm Mother   . Ovarian cancer Mother   . Breast cancer Mother   . Thyroid disease Sister   . Heart disease Sister   . Lymphoma Father   . Diabetes Father   . Hypertension Father   . Congestive Heart Failure Father   . Thyroid disease Brother   . Thyroid disease Paternal Grandmother   . Heart attack Sister        at age 60  . Stroke Sister   . Hypertension Sister   . Breast cancer Maternal Aunt   . Breast cancer Maternal Grandmother   . Breast cancer Maternal Aunt   . Breast cancer Maternal Aunt   . Other Brother        Musician accident  . Bipolar disorder Son   . Other Son        GSW    Allergies  Allergen Reactions  . Influenza Vaccines Anaphylaxis  . Codeine Itching  . Demerol Other (See Comments)    SEIZURE  . Influenza Vac Split Quad   . Nsaids Other (See Comments)    NAPROXEN, IBUPROFEN, SKELAXIN---  CAUSES PALPITATIONS  . Prednisone     Caused her glucose to go up to 500 and went to ED  . Tramadol     Contraindicated with Victoza     Medication list has been reviewed and updated.  Current Outpatient Medications on File Prior to Visit  Medication Sig Dispense Refill  . albuterol (PROAIR HFA) 108 (90 Base) MCG/ACT inhaler Inhale 1-2 puffs into the lungs every 6 (six) hours as needed for wheezing or shortness of breath. 18 g 6  . albuterol (PROVENTIL HFA;VENTOLIN HFA) 108 (90 Base) MCG/ACT inhaler  Inhale 2 puffs into the lungs every 6 (six) hours as needed. For shortness of breath. 18 g 2  . albuterol (PROVENTIL) (2.5 MG/3ML) 0.083% nebulizer solution Take 3 mLs (2.5 mg total) by nebulization every 6 (six) hours as needed for wheezing or shortness of breath. 150 mL 1  . aspirin EC 325 MG  tablet Take 1 tablet (325 mg total) by mouth daily. 90 tablet 1  . cetirizine (ZYRTEC) 10 MG tablet Take 1 tablet (10 mg total) by mouth daily. 90 tablet 3  . clopidogrel (PLAVIX) 75 MG tablet Take 1 tablet (75 mg total) by mouth daily. 90 tablet 3  . docusate sodium (COLACE) 100 MG capsule Take 1 capsule (100 mg total) by mouth daily. 10 capsule 2  . empagliflozin (JARDIANCE) 10 MG TABS tablet Take 5 mg by mouth daily. 45 tablet 3  . famotidine (PEPCID) 10 MG tablet Take 10 mg by mouth daily.    . fluticasone (FLOVENT HFA) 220 MCG/ACT inhaler Inhale 1 puff into the lungs 2 (two) times daily. May increase to 2 puffs if needed 1 Inhaler 12  . furosemide (LASIX) 20 MG tablet Take 1 tab daily prn swelling/weight gain. 90 tablet 3  . gabapentin (NEURONTIN) 300 MG capsule Take 2 capsules (600 mg total) by mouth 2 (two) times daily. 360 capsule 1  . glucose blood (ACCU-CHEK AVIVA) test strip Use as instructed 100 each 12  . HYDROcodone-acetaminophen (NORCO/VICODIN) 5-325 MG tablet Take 1 tablet by mouth every 4 (four) hours as needed. 10 tablet 0  . ipratropium (ATROVENT) 0.03 % nasal spray Place 2 sprays into the nose 4 (four) times daily. 30 mL 6  . Lancets MISC Used to check blood sugars daily 100 each 10  . linagliptin (TRADJENTA) 5 MG TABS tablet Take 0.5 tablets (2.5 mg total) by mouth daily. 45 tablet 3  . lisinopril (PRINIVIL,ZESTRIL) 20 MG tablet Take 1 tablet (20 mg total) by mouth daily. 90 tablet 3  . LORazepam (ATIVAN) 1 MG tablet Take 1 tablet (1 mg total) by mouth 3 (three) times daily as needed for anxiety. 15 tablet 0  . metFORMIN (GLUCOPHAGE) 500 MG tablet Take 2 tablets (1,000 mg total) by mouth  2 (two) times daily. TAKE 2 TABLETS(1000 MG) BY MOUTH TWICE DAILY WITH A MEAL 360 tablet 3  . metoprolol tartrate (LOPRESSOR) 25 MG tablet Take 1 tablet (25 mg total) by mouth 2 (two) times daily. 180 tablet 3  . mometasone (ASMANEX) 220 MCG/INH inhaler Use 1 puff daily followed by rinsing your mouth out with water. (Patient taking differently: Inhale 1 puff into the lungs daily. \) 1 Inhaler 12  . montelukast (SINGULAIR) 10 MG tablet Take 1 tablet (10 mg total) by mouth at bedtime. 30 tablet 3  . predniSONE (DELTASONE) 10 MG tablet Prednisone 40 mg po daily x 1 day then Prednisone 30 mg po daily x 1 day then Prednisone 20 mg po daily x 1 day then Prednisone 10 mg daily x 1 day then stop... 10 tablet 0  . QUEtiapine (SEROQUEL) 100 MG tablet Take 0.5 tablets (50 mg total) by mouth at bedtime. 45 tablet 3  . sertraline (ZOLOFT) 50 MG tablet Take 0.5 tablets (25 mg total) by mouth at bedtime. 45 tablet 3   No current facility-administered medications on file prior to visit.     Review of Systems:  As per HPI- otherwise negative.   Physical Examination: Vitals:   02/02/17 1211  BP: (!) 120/58  Pulse: 68  Resp: 16  Temp: 98.3 F (36.8 C)  SpO2: 100%   Vitals:   02/02/17 1211  Weight: 190 lb (86.2 kg)  Height: 5\' 1"  (1.549 m)   Body mass index is 35.9 kg/m. Ideal Body Weight: Weight in (lb) to have BMI = 25: 132  GEN: WDWN, NAD, Non-toxic, A & O x  3, obese, looks well HEENT: Atraumatic, Normocephalic. Neck supple. No masses, No LAD.  Bilateral TM wnl, oropharynx normal.  PEERL,EOMI.   Ears and Nose: No external deformity. CV: RRR, No M/G/R. No JVD. No thrill. No extra heart sounds. PULM: CTA B, no wheezes, crackles, rhonchi. No retractions. No resp. distress. No accessory muscle use. ABD: S, NT, ND, +BS. No rebound. No HSM. EXTR: No c/c/e NEURO Normal gait.  PSYCH: Normally interactive. Conversant. Not depressed or anxious appearing.  Calm demeanor.  Normal LE strength She  indicates tenderness over the thoracic spine between the shoulder blades.  No redness or lesion of the skin  Normal cervical ROM  Dg Thoracic Spine 2 View  Result Date: 02/02/2017 CLINICAL DATA:  Midthoracic back pain for several months with increased severity over the past 2 months. Symptoms are greatest between the shoulder blades. EXAM: THORACIC SPINE 2 VIEWS COMPARISON:  Chest x-ray of August 19, 2016 and thoracic spine series of March 23, 2016. FINDINGS: The thoracic vertebral bodies are preserved in height. There is multilevel degenerative disc space narrowing. Prominent right lateral endplate osteophytes are noted at multiple levels and are stable. The pedicles are intact. There are no abnormal paravertebral soft tissue densities. Degenerative disc space narrowing extends into the upper lumbar spine. IMPRESSION: Multilevel degenerative disc space narrowing with endplate spur formation. No compression fracture nor other acute thoracic spine abnormality. Electronically Signed   By: David  Martinique M.D.   On: 02/02/2017 12:52    Assessment and Plan: Thoracic spine pain - Plan: DG Thoracic Spine 2 View, Ambulatory referral to Orthopedic Surgery  Degenerative disc disease, thoracic  Here today with pain in her thoracic spine and possible nerve compression sx in her arm.  She has a known history of degenerative disc disease and nerve impingement in her lumbar spine.  She is a pt of Dr. Marlou Sa, so will refer to one of his partners who does spine work.  For pain, will try increasing her gabapentin to 600 TID.  She will increase her dose gradually  Signed [name], MD

## 2017-02-09 ENCOUNTER — Ambulatory Visit (INDEPENDENT_AMBULATORY_CARE_PROVIDER_SITE_OTHER): Payer: Self-pay

## 2017-02-09 ENCOUNTER — Encounter (INDEPENDENT_AMBULATORY_CARE_PROVIDER_SITE_OTHER): Payer: Self-pay | Admitting: Orthopaedic Surgery

## 2017-02-09 ENCOUNTER — Ambulatory Visit (INDEPENDENT_AMBULATORY_CARE_PROVIDER_SITE_OTHER): Payer: Medicare Other | Admitting: Orthopaedic Surgery

## 2017-02-09 VITALS — BP 132/84 | HR 77 | Ht 61.0 in | Wt 190.0 lb

## 2017-02-09 DIAGNOSIS — M5412 Radiculopathy, cervical region: Secondary | ICD-10-CM | POA: Diagnosis not present

## 2017-02-14 ENCOUNTER — Encounter (INDEPENDENT_AMBULATORY_CARE_PROVIDER_SITE_OTHER): Payer: Self-pay | Admitting: Orthopaedic Surgery

## 2017-02-14 ENCOUNTER — Encounter: Payer: Self-pay | Admitting: Neurology

## 2017-02-14 ENCOUNTER — Ambulatory Visit (INDEPENDENT_AMBULATORY_CARE_PROVIDER_SITE_OTHER): Payer: Medicare Other | Admitting: Neurology

## 2017-02-14 ENCOUNTER — Telehealth: Payer: Self-pay

## 2017-02-14 VITALS — BP 139/84 | HR 64 | Wt 191.6 lb

## 2017-02-14 DIAGNOSIS — I671 Cerebral aneurysm, nonruptured: Secondary | ICD-10-CM | POA: Diagnosis not present

## 2017-02-14 DIAGNOSIS — M5412 Radiculopathy, cervical region: Secondary | ICD-10-CM

## 2017-02-14 DIAGNOSIS — M5481 Occipital neuralgia: Secondary | ICD-10-CM | POA: Diagnosis not present

## 2017-02-14 DIAGNOSIS — G629 Polyneuropathy, unspecified: Secondary | ICD-10-CM

## 2017-02-14 NOTE — Progress Notes (Signed)
Office Visit Note   Patient: Sherry [name]           Date of Birth: [birth year]           MRN: [id number] Visit Date: 02/09/2017              Requested by: Darreld Mclean, MD [address] [place], [place] [address] PCP: Darreld Mclean, MD   Assessment & Plan: Visit Diagnoses:  1. Radiculopathy, cervical region     Plan: Patient's had anti-inflammatories Neurontin.  Trouble with pain and has to keep her arm up overhead persistently.  Will obtain an MRI scan and see her back after cervical MRI scan for review to rule out  C6- 7 level.  Follow-Up Instructions: No Follow-up on file.   Orders:  Orders Placed This Encounter  Procedures  . XR Cervical Spine 2 or 3 views  . MR Cervical Spine w/o contrast   No orders of the defined types were placed in this encounter.     Procedures: No procedures performed   Clinical Data: No additional findings.   Subjective: Chief Complaint  Patient presents with  . Middle Back - Pain    HPI 67 year old female seen with onset of thoracic pain between her shoulder blades arm pain.  Had some numbness and tingling she states her arms feel weak and stiff.  Above her head position gives her some relief.  Had previous scanning that showed some mild impingement at T10-11, 1-2, L4-5 and L5-S1.  Review of Systems positive for diabetes asthma dyspnea, chest pain depression, peripheral neuropathy goiter, obesity, rotator cuff repair left, left bunionectomy.  Otherwise negative as it pertains HPI.   Objective: Vital Signs: BP 132/84   Pulse 77   Ht 5\' 1"  (1.549 m)   Wt 190 lb (86.2 kg)   BMI 35.90 kg/m   Physical Exam  Constitutional: She is oriented to person, place, and time. She appears well-developed.  HENT:  Head: Normocephalic.  Right Ear: External ear normal.  Left Ear: External ear normal.  Eyes: Pupils are equal, round, and reactive to light.  Neck: No tracheal deviation present. No thyromegaly  present.  Cardiovascular: Normal rate.  Pulmonary/Chest: Effort normal.  Abdominal: Soft.  Neurological: She is alert and oriented to person, place, and time.  Skin: Skin is warm and dry.  Psychiatric: She has a normal mood and affect. Her behavior is normal.    Ortho Exam patient has brachial plexus tenderness both right and left.  Negative impingement of the shoulders.  Tenderness superior medial border of the scapula.  No winging of the scapula.  Upper extremity and lower extremity reflexes are 2+ and symmetrical.  Trace wrist flexion weakness trace triceps weakness right and left.  Negative Yergason test.  No shoulder subluxation.  Specialty Comments:  No specialty comments available.  Imaging: No results found.   PMFS History: Patient Active Problem List   Diagnosis Date Noted  . Acute asthma exacerbation 08/19/2016  . Preoperative cardiovascular examination 08/16/2016  . Cervicalgia 03/23/2016  . Acute back pain with sciatica, right 01/14/2016  . Vertigo 04/14/2015  . Anxiety state 01/22/2015  . HLD (hyperlipidemia) 01/22/2015  . Type 2 diabetes mellitus with circulatory disorder ([place]) 01/22/2015  . Intracranial vascular stenosis 01/22/2015  . Aneurysm ([place])   . Headache   . Diabetes mellitus with complication ([place]) 44/04/4740  . Essential hypertension 11/12/2014  . Dependent edema 11/12/2014  . Peripheral neuropathy 11/12/2014  . Depression 11/12/2014  .  Facial droop   . Chest pain   . TIA (transient ischemic attack) 11/11/2014  . Dyspnea 08/06/2014  . Hyperthyroidism 08/06/2014  . Meniere's disease 06/07/2014  . History of CHF (congestive heart failure) 06/07/2014  . Essential hypertension, benign 06/13/2013  . Goiter 06/13/2013  . Obesity, unspecified 06/13/2013   Past Medical History:  Diagnosis Date  . Cerebral aneurysm   . Complication of anesthesia HYPOTENSION INTRAOP W/ HYSTERECTOMY IN 1987--  DID OK W/ TOTAL KNEE IN 2008  . Diabetes mellitus   . DJD  (degenerative joint disease)   . Dyslipidemia   . GERD (gastroesophageal reflux disease)   . H/O hiatal hernia   . History of CHF (congestive heart failure) 2010-  RESOLVED  . History of peptic ulcer YRS AGO  . Hypertension   . Meniere's disease   . Mitral valve prolapse occasional palpitations w/ chest discomfort  . OA (osteoarthritis)   . Rotator cuff tear, left   . Stroke [facility])     Family History  Problem Relation Age of Onset  . Heart disease Mother        in her 66s, heart attack  . Diabetes Mother   . Kidney disease Mother        dialysis  . Thyroid disease Mother   . Aneurysm Mother   . Ovarian cancer Mother   . Breast cancer Mother   . Thyroid disease Sister   . Heart disease Sister   . Lymphoma Father   . Diabetes Father   . Hypertension Father   . Congestive Heart Failure Father   . Thyroid disease Brother   . Thyroid disease Paternal Grandmother   . Heart attack Sister        at age 70  . Stroke Sister   . Hypertension Sister   . Breast cancer Maternal Aunt   . Breast cancer Maternal Grandmother   . Breast cancer Maternal Aunt   . Breast cancer Maternal Aunt   . Other Brother        Musician accident  . Bipolar disorder Son   . Other Son        GSW    Past Surgical History:  Procedure Laterality Date  . BUNIONECTOMY  2007   LEFT FOOT  . LEFT SHOULDER SURGERY  1997   ROTATOR CUFF REPAIR  . SHOULDER ARTHROSCOPY  09/20/2011   Procedure: ARTHROSCOPY SHOULDER;  Surgeon: Johnn Hai, MD;  Location: [facility];  Service: Orthopedics;  Laterality: Left;  SAD AND MINI OPEN ROTATOR CUFF REPAIR    . TOTAL KNEE ARTHROPLASTY  09-12-2006  [facility]   RIGHT KNEE  . TUBAL LIGATION  1976  . TYMPANOPLASTY  1990;   1980  . VAGINAL HYSTERECTOMY  1987   Social History   Occupational History  . Occupation: LPN - retired  Tobacco Use  . Smoking status: Never Smoker  . Smokeless tobacco: Never Used  Substance and Sexual Activity  . Alcohol use: No  .  Drug use: No  . Sexual activity: Not on file

## 2017-02-14 NOTE — Telephone Encounter (Signed)
Clearance form fax to [facility] orthopedics at [phone]. Form fax twice, and receive.

## 2017-02-14 NOTE — Progress Notes (Signed)
STROKE NEUROLOGY FOLLOW UP NOTE  NAME: Sherry [name] DOB: 1950/02/17  REASON FOR VISIT: stroke follow up HISTORY FROM: pt and chart  Today we had the pleasure of seeing Sherry [name] in follow-up at our Neurology Clinic. Pt was accompanied by no one.   History Summary [name] is a 67 y.o. female with history of HTN, DM, HLD, MVP admitted on 11/11/14 for complaints of facial droop, decreased sensation left face as well as stabbing sensation in her left shoulder and arm pit after an argument with husband. She appeared to be anxious on exam. 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 Stroke work up with TTE and CUS unremarkable. LDL 78 and A1C 7.4. She was considered possible TIA due to multiple risk factors but also not to exclude from anxiety as the etiology. She was discharged with ASA and lipitor.   01/22/15 follow up - the patient has been doing well. No recurrent stroke like symptoms. Had seen Dr. Estanislado Pandy for aneurysm and cerebral angio showed right M1 75% stenosis and right 7x76mm saccular aneurysm at right ICA cavernous segment. Pt optioned conservative treatment towards aneurysm so far and plan for another CTA in 6 months. Her glucose at home from 51-139 and BP in good control. Today BP 134/68.    05/26/15 follow-up - she was admitted on 04/14/15 for left facial numbness and drooping. MRI no acute stroke, and MRA no LVO. CUS and TTE unremarkable. A1C 9.7 and LDL was -7. She admitted that she was under a lot of stress at that time including her 70 yo grandson made her crazy. She was continued on ASA and lipitor on discharge with diagnosis of possible TIA although anxiety more likely. Since then, she was doing fine. Still struggling with DM control. Today BP 131/69.  Interval History During the interval time, patient has been stable.  However, patient again referred here for multiple complaints.  Patient stated that she still has imbalance  with a turning and intermittent falling.  Her A1c recently 6.4, better controlled than before, however, still has neuropathy in her gabapentin was increased recently to 600 mg 3 times a day.  Complains of achy back pain and neck pain.  Complains of achy headache on the top of the head with occasionally shooting pain from the neck to the scalp.  Still has occasional episodic left facial numbness and left facial droop.  BP today 139/84.  She has been on both aspirin 325 and Plavix for a while.  REVIEW OF SYSTEMS: Full 14 system review of systems performed and notable only for those listed below and in HPI above, all others are negative:  Constitutional:  fatigue Cardiovascular:  Ear/Nose/Throat:   Skin:  Eyes:   Respiratory:   Gastroitestinal:   Genitourinary:  Hematology/Lymphatic:   Endocrine:  Musculoskeletal: Joint pain, back pain, neck pain, neck stiffness Allergy/Immunology: Food allergies Neurological: Dizziness, headache, numbness, intermittent facial drooping Psychiatric: Depression Sleep:   The following represents the patient's updated allergies and side effects list: Allergies  Allergen Reactions  . Influenza Vaccines Anaphylaxis  . Codeine Itching  . Demerol Other (See Comments)    SEIZURE  . Influenza Vac Split Quad   . Nsaids Other (See Comments)    NAPROXEN, IBUPROFEN, SKELAXIN---  CAUSES PALPITATIONS  . Prednisone     Caused her glucose to go up to 500 and went to ED  . Tramadol     Contraindicated with Victoza     The  neurologically relevant items on the patient's problem list were reviewed on today's visit.  Neurologic Examination  A problem focused neurological exam (12 or more points of the single system neurologic examination, vital signs counts as 1 point, cranial nerves count for 8 points) was performed.  Blood pressure 139/84, pulse 64, weight 191 lb 9.6 oz (86.9 kg).  General - obses, well developed, in no apparent distress.  Ophthalmologic - Fundi  not visualized due to eye movement.  Cardiovascular - Regular rate and rhythm.  Mental Status -  Level of arousal and orientation to time, place, and person were intact. Language including expression, naming, repetition, comprehension was assessed and found intact. Fund of Knowledge was assessed and was intact.  Cranial Nerves II - XII - II - Visual field intact OU. III, IV, VI - Extraocular movements intact. V - Facial sensation intact bilaterally. VII - Facial movement intact bilaterally. VIII - Hearing & vestibular intact bilaterally. X - Palate elevates symmetrically. XI - Chin turning & shoulder shrug intact bilaterally. XII - Tongue protrusion intact.  Bilateral occipital nerve tenderness on palpation.  Motor Strength - The patient's strength was normal in all extremities and pronator drift was absent.  Bulk was normal and fasciculations were absent.   Motor Tone - Muscle tone was assessed at the neck and appendages and was normal.  Reflexes - The patient's reflexes were 1+ in all extremities and she had no pathological reflexes.  Sensory - Light touch, temperature/pinprick were assessed and were normal.    Coordination - The patient had normal movements in the hands and feet with no ataxia or dysmetria.  Tremor was absent.  Gait and Station - broad based gait due to obesity.  Data reviewed: I personally reviewed the images and agree with the radiology interpretations.  Ct Head Wo Contrast 11/11/2014 Stable head CT. No acute intracranial findings or explanation for the patient's symptoms.   Mr Brain Wo Contrast 11/11/2014 Normal examination.   Mr Jodene Nam Head/brain Wo Cm 11/12/2014 1. Mild branch vessel atherosclerotic changes without evidence of medium or large vessel occlusion or significant proximal stenosis. 2. 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   Carotid Doppler There is 1-39% bilateral ICA stenosis. Vertebral artery flow is  antegrade.   2D Echocardiogram  - Left ventricle: The cavity size was normal. Systolic function was normal. The estimated ejection fraction was in the range of 60%to 65%. Wall motion was normal; there were no regional wallmotion abnormalities. There was no evidence of elevatedventricular filling pressure by Doppler parameters. - Mitral valve: Calcified annulus. Impressions: No cardiac source of emboli was indentified.  2D echo 04/15/15 - Left ventricle: The cavity size was normal. Wall thickness was  normal. Systolic function was normal. The estimated ejection  fraction was in the range of 60% to 65%. Wall motion was normal;  there were no regional wall motion abnormalities.  CUS - There is no obvious evidence of hemodyanamically significant internal carotid artery stenosis. Vertebral arteries are patent with antegrade flow.  Cerebral angio 11/18/14 -  Approximately 75% stenosis of the right middle cerebral artery M1 segment with mild post stenotic dilatation. Approximately 7 x 4.1 mm saccular wide neck aneurysm arising from the caval cavernous segment of the right internal carotid artery.  MRI and MRA brain 04/15/15 Normal brain MRI. No acute intracranial infarct or other abnormality identified. MRA head showed 1. Stable intracranial MRA. No large or proximal arterial branch occlusion. 2. Mild branch vessel atherosclerotic changes without proximal severe  or correctable stenosis. 3. 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  Component     Latest Ref Rng 11/12/2014 12/02/2014 01/13/2015  Cholesterol     0 - 200 mg/dL 150    Triglycerides     <150 mg/dL 66    HDL Cholesterol     >40 mg/dL 59    Total CHOL/HDL Ratio      2.5    VLDL     0 - 40 mg/dL 13    LDL (calc)     0 - 99 mg/dL 78    Hemoglobin A1C     4.8 - 5.6 % 7.4 (H)    Mean Plasma Glucose      166    Free T4     0.60 - 1.60 ng/dL  1.19 0.74  TSH     0.35 - 4.50 uIU/mL  0.23 (L) 1.08   Component     Latest  Ref Rng 04/15/2015  Cholesterol     0 - 200 mg/dL 97  Triglycerides     <150 mg/dL 285 (H)  HDL Cholesterol     >40 mg/dL 47  Total CHOL/HDL Ratio      2.1  VLDL     0 - 40 mg/dL 57 (H)  LDL (calc)     0 - 99 mg/dL NEG 7  Hemoglobin A1C     4.8 - 5.6 % 9.7 (H)  Mean Plasma Glucose      232    Assessment: As you may recall, she is a 67 y.o. African American female with PMH of HTN, DM, HLD, MVP admitted on 11/11/14 for complaints of facial droop, decreased sensation left face as well as stabbing sensation in her left shoulder and arm pit after an argument with husband. MRI showed no acute infarct and MRA showed 66mm right cavernous ICA aneurysm. Stroke work up with TTE and CUS unremarkable. LDL 78 and A1C 7.4. She was considered possible TIA due to multiple risk factors but also not to exclude from anxiety as the etiology. She was discharged with ASA and lipitor. During the interval time, she saw Dr. Estanislado Pandy for aneurysm and cerebral angio showed right M1 75% stenosis and right 7x31mm saccular aneurysm at right ICA cavernous segment. Pt optioned conservative treatment towards aneurysm so far. 04/2015 was admitted for possible right brain TIA vs. Anxiety. A1C 9.7 and LDL too low at -7.     Of the time, patient has multiple complaints with imbalance, occasionally falling back pain, neck pain, headache with occipital neuralgia, occasionally episodic left facial numbness and drooping.  Symptoms consistent with occipital neuralgia, cervical radiculopathy, stress/anxiety related symptoms.  Will do EMG  and refer to PT.  However, patient has been on dual antiplatelet for more than a year, will change to monotherapy.  Repeat MRA to monitor aneurysm.  Plan:  - continue plavix for stroke prevention, but stop ASA for now.  - will repeat MRA to evaluate aneurysm - EMG/NCS to evaluate neck pain - PT for balance training and neck pain and back pain - Follow up with your primary care physician for stroke risk  factor modification. Recommend maintain blood pressure goal 130/80, diabetes with hemoglobin A1c goal below 6.5% and lipids with LDL cholesterol goal below 70 mg/dL.  - check BP and glucose and record  - relaxation and cope with stress - follow up with eye doctor regularly  - healthy diet and regular exercise - follow up in 6 months.  I spent more  than 25 minutes of face to face time with the patient. Greater than 50% of time was spent in counseling and coordination of care. We have discussed about aneurysm monitoring, treatment of occipital neuralgia and cervical radiculopathy, and stroke risk factor modification.   Orders Placed This Encounter  Procedures  . MR MRA HEAD WO CONTRAST    Standing Status:   Future    Standing Expiration Date:   04/15/2018    Order Specific Question:   What is the patient's sedation requirement?    Answer:   No Sedation    Order Specific Question:   Does the patient have a pacemaker or implanted devices?    Answer:   No    Order Specific Question:   Preferred imaging location?    Answer:   Internal    Order Specific Question:   Radiology Contrast Protocol - do NOT remove file path    Answer:   [email or web address]  . Ambulatory referral to Physical Therapy    Referral Priority:   Routine    Referral Type:   Physical Medicine    Referral Reason:   Specialty Services Required    Requested Specialty:   Physical Therapy    Number of Visits Requested:   1  . NCV with EMG(electromyography)    Cervical radiculopathy    Standing Status:   Future    Standing Expiration Date:   02/14/2018    Order Specific Question:   Where should this test be performed?    Answer:   [facility]    No orders of the defined types were placed in this encounter.   Patient Instructions  - continue plavix for stroke prevention, but stop ASA for now.  - will repeat MRA to evaluate aneurysm - will check nerve conduction study to evaluate neck pain - PT for  balance training and neck pain and back pain - Follow up with your primary care physician for stroke risk factor modification. Recommend maintain blood pressure goal 130/80, diabetes with hemoglobin A1c goal below 6.5% and lipids with LDL cholesterol goal below 70 mg/dL.  - check BP and glucose and record  - relaxation and cope with stress - follow up with eye doctor regularly  - healthy diet and regular exercise - follow up in 6 months.   Rosalin Hawking, MD PhD [facility] [address], [place], [place] [address] [phone]

## 2017-02-14 NOTE — Patient Instructions (Signed)
-   continue plavix for stroke prevention, but stop ASA for now.  - will repeat MRA to evaluate aneurysm - will check nerve conduction study to evaluate neck pain - PT for balance training and neck pain and back pain - Follow up with your primary care physician for stroke risk factor modification. Recommend maintain blood pressure goal 130/80, diabetes with hemoglobin A1c goal below 6.5% and lipids with LDL cholesterol goal below 70 mg/dL.  - check BP and glucose and record  - relaxation and cope with stress - follow up with eye doctor regularly  - healthy diet and regular exercise - follow up in 6 months.

## 2017-02-25 ENCOUNTER — Ambulatory Visit
Admission: RE | Admit: 2017-02-25 | Discharge: 2017-02-25 | Disposition: A | Discharge status: Home / Self Care | Payer: Medicare Other | Source: Ambulatory Visit | Attending: Orthopaedic Surgery | Admitting: Orthopaedic Surgery

## 2017-02-25 ENCOUNTER — Ambulatory Visit
Admission: RE | Admit: 2017-02-25 | Discharge: 2017-02-25 | Disposition: A | Discharge status: Home / Self Care | Payer: Medicare Other | Source: Ambulatory Visit | Attending: Neurology | Admitting: Neurology

## 2017-02-25 DIAGNOSIS — I671 Cerebral aneurysm, nonruptured: Secondary | ICD-10-CM

## 2017-02-25 DIAGNOSIS — M5412 Radiculopathy, cervical region: Secondary | ICD-10-CM

## 2017-02-25 DIAGNOSIS — M4802 Spinal stenosis, cervical region: Secondary | ICD-10-CM | POA: Diagnosis not present

## 2017-02-28 ENCOUNTER — Other Ambulatory Visit: Payer: Self-pay | Admitting: Family Medicine

## 2017-02-28 DIAGNOSIS — E118 Type 2 diabetes mellitus with unspecified complications: Secondary | ICD-10-CM

## 2017-03-01 ENCOUNTER — Telehealth: Payer: Self-pay

## 2017-03-01 NOTE — Telephone Encounter (Signed)
-----   Message from Rosalin Hawking, MD sent at 02/25/2017  5:32 PM EST ----- Could you please let the patient know that the MRA head test done recently in our office showed stable aneurysm and vasculature. No change from prior. No further action needed at this time. Please continue current treatment. Thanks.  Rosalin Hawking, MD PhD Stroke Neurology 02/25/2017 5:32 PM

## 2017-03-01 NOTE — Telephone Encounter (Signed)
Rn call patient that the MRA head showed stable aneurysm and vasculature. NO change from prior scan. No further treatment at this time. Continue treatment plan. Pt verbalized understanding. ------

## 2017-03-02 ENCOUNTER — Ambulatory Visit (INDEPENDENT_AMBULATORY_CARE_PROVIDER_SITE_OTHER): Payer: Medicare Other | Admitting: Orthopaedic Surgery

## 2017-03-03 ENCOUNTER — Encounter: Payer: Self-pay | Admitting: Physical Therapy

## 2017-03-03 ENCOUNTER — Ambulatory Visit: Payer: Medicare Other | Attending: Neurology | Admitting: Physical Therapy

## 2017-03-03 ENCOUNTER — Ambulatory Visit (INDEPENDENT_AMBULATORY_CARE_PROVIDER_SITE_OTHER): Payer: Medicare Other | Admitting: Diagnostic Neuroimaging

## 2017-03-03 ENCOUNTER — Encounter (INDEPENDENT_AMBULATORY_CARE_PROVIDER_SITE_OTHER): Payer: Medicare Other | Admitting: Diagnostic Neuroimaging

## 2017-03-03 DIAGNOSIS — G629 Polyneuropathy, unspecified: Secondary | ICD-10-CM | POA: Diagnosis not present

## 2017-03-03 DIAGNOSIS — R293 Abnormal posture: Secondary | ICD-10-CM | POA: Insufficient documentation

## 2017-03-03 DIAGNOSIS — Z0289 Encounter for other administrative examinations: Secondary | ICD-10-CM

## 2017-03-03 DIAGNOSIS — M542 Cervicalgia: Secondary | ICD-10-CM | POA: Insufficient documentation

## 2017-03-03 DIAGNOSIS — M5412 Radiculopathy, cervical region: Secondary | ICD-10-CM

## 2017-03-03 NOTE — Patient Instructions (Signed)
Flexibility: Neck Retraction    Pull head straight back, keeping eyes and jaw level. Repeat _10___ times per set. Do __1__ sets per session. Do _2___ sessions per day.  http://orth.exer.us/345   Copyright  VHI. All rights reserved.

## 2017-03-04 NOTE — Progress Notes (Signed)
[facility]  NCS (NERVE CONDUCTION STUDY) WITH EMG (ELECTROMYOGRAPHY) REPORT    STUDY DATE: 03/03/17 PATIENT NAME: Sherry [name] DOB: 12-01-1950 MRN: [id number]  ORDERING CLINICIAN: Rosalin Hawking, MD PhD   TECHNOLOGIST: Oneita Jolly ELECTROMYOGRAPHER: Earlean Polka. Demetreus Lothamer, MD  CLINICAL INFORMATION: 67 year old here for evaluation of bilateral hand numbness.  FINDINGS: NERVE CONDUCTION STUDY: Bilateral median motor responses have prolonged distal latencies (left 5.6 ms, right 6.6 ms), normal amplitude on the left, decreased amplitude on the right, and normal conduction velocities.  Bilateral ulnar motor responses and F wave latencies are normal.  Of note  A right sided median to ulnar anastomosis (Martin-Gruber type II) is noted.  This is a normal variant.  Left median sensory response could not be obtained.  Right median sensory response could not be obtained.  Bilateral ulnar sensory responses are normal.  NEEDLE ELECTROMYOGRAPHY:  Needle examination of right upper extremity deltoid, biceps, triceps, flexor carpi radialis, first dorsal interosseous is normal.    IMPRESSION:   Abnormal study demonstrating: -Bilateral median neuropathies at the wrists consistent with bilateral carpal tunnel syndrome.    INTERPRETING PHYSICIAN:  Penni Bombard, MD Certified in Neurology, Neurophysiology and Neuroimaging  [facility] [address] [place], [place] [address] [phone]     [facility]    Nerve / Sites Muscle Latency Ref. Amplitude Ref. Rel Amp Segments Distance Velocity Ref. Area    ms ms mV mV %  cm m/s m/s mVms  L Median - APB     Wrist APB 5.6 ?4.4 4.6 ?4.0 100 Wrist - APB 7   26.6     Upper arm APB 10.3  4.3  93.8 Upper arm - Wrist 23 49 ?49 25.1  R Median - APB     Wrist APB 6.6 ?4.4 3.3 ?4.0 100 Wrist - APB 7   17.2     Upper arm APB 10.0  3.2  94.9 Upper arm - Wrist 23 68 ?49 14.7    L Ulnar - ADM     Wrist ADM 2.9 ?3.3 10.2 ?6.0 100 Wrist - ADM 7   34.7     B.Elbow ADM 6.4  9.4  92.2 B.Elbow - Wrist 20 56 ?49 35.3     A.Elbow ADM 8.3  8.3  88.2 A.Elbow - B.Elbow 10 53 ?49 31.0         A.Elbow - Wrist      R Ulnar - ADM     Wrist ADM 2.9 ?3.3 8.3 ?6.0 100 Wrist - ADM 7   37.9     B.Elbow ADM 6.7  6.6  79.8 B.Elbow - Wrist 20 52 ?49 29.9     A.Elbow ADM 8.5  6.8  103 A.Elbow - B.Elbow 10 55 ?49 31.1         A.Elbow - Wrist      R Median, Ulnar - Martin-Gruber Anastomosis     Ulnar Wrist ADM 2.8  7.7  100     35.4     Ulnar Elbow ADM 7.2  5.7  74.2     24.4     Median Wrist ADM NR  NR  NR     NR     Median Elbow ADM 7.9  0.7            Ulnar Wrist FDI 3.5  13.7  1986     40.7     Ulnar Elbow FDI 7.6  7.9  57.5     21.8  Median Wrist FDI 6.1  0.7  8.37     3.1     Median Elbow FDI 9.3  5.8  886     20.6               SNC    Nerve / Sites Rec. Site Peak Lat Ref.  Amp Ref. Segments Distance    ms ms V V  cm  L Median - Orthodromic (Dig II, Mid palm)     Dig II Wrist NR ?3.4 NR ?10 Dig II - Wrist 13  R Median - Orthodromic (Dig II, Mid palm)     Dig II Wrist NR ?3.4 NR ?10 Dig II - Wrist 13  L Ulnar - Orthodromic, (Dig V, Mid palm)     Dig V Wrist 3.0 ?3.1 11 ?5 Dig V - Wrist 11  R Ulnar - Orthodromic, (Dig V, Mid palm)     Dig V Wrist 3.1 ?3.1 7 ?5 Dig V - Wrist 41             F  Wave    Nerve F Lat Ref.   ms ms  L Ulnar - ADM 26.4 ?32.0  R Ulnar - ADM 26.2 ?32.0         EMG full       EMG Summary Table    Spontaneous MUAP Recruitment  Muscle IA Fib PSW Fasc Other Amp Dur. Poly Pattern  R. Deltoid Normal None None None _______ Normal Normal Normal Normal  R. Biceps brachii Normal None None None _______ Normal Normal Normal Normal  R. Triceps brachii Normal None None None _______ Normal Normal Normal Normal  R. Flexor carpi radialis Normal None None None _______ Normal Normal Normal Normal  R. First dorsal interosseous Normal None None None  _______ Normal Normal Normal Normal

## 2017-03-04 NOTE — Therapy (Signed)
[facility] [address] [place], [place], [address] Phone: [phone]   Fax:  [phone]  Physical Therapy Evaluation  Patient Details  Name: Sherry [name] MRN: [id number] Date of Birth: [birth year] Referring Provider: Neuropathy:  Cervical radiculopathy   Encounter Date: 03/03/2017  PT End of Session - 03/04/17 1543    Visit Number  1    Number of Visits  7    Date for PT Re-Evaluation  04/02/17    Authorization Type  [facility] Medicare/Medicaid    Authorization Time Period  03-03-17 - 04-02-17    PT Start Time  1447    PT Stop Time  1533    PT Time Calculation (min)  46 min       Past Medical History:  Diagnosis Date  . Cerebral aneurysm   . Complication of anesthesia HYPOTENSION INTRAOP W/ HYSTERECTOMY IN 1987--  DID OK W/ TOTAL KNEE IN 2008  . Diabetes mellitus   . DJD (degenerative joint disease)   . Dyslipidemia   . GERD (gastroesophageal reflux disease)   . H/O hiatal hernia   . History of CHF (congestive heart failure) 2010-  RESOLVED  . History of peptic ulcer YRS AGO  . Hypertension   . Meniere's disease   . Mitral valve prolapse occasional palpitations w/ chest discomfort  . OA (osteoarthritis)   . Rotator cuff tear, left   . Stroke [facility])     Past Surgical History:  Procedure Laterality Date  . BUNIONECTOMY  2007   LEFT FOOT  . LEFT SHOULDER SURGERY  1997   ROTATOR CUFF REPAIR  . SHOULDER ARTHROSCOPY  09/20/2011   Procedure: ARTHROSCOPY SHOULDER;  Surgeon: Johnn Hai, MD;  Location: [facility];  Service: Orthopedics;  Laterality: Left;  SAD AND MINI OPEN ROTATOR CUFF REPAIR    . TOTAL KNEE ARTHROPLASTY  09-12-2006  [facility]   RIGHT KNEE  . TUBAL LIGATION  1976  . TYMPANOPLASTY  1990;   1980  . VAGINAL HYSTERECTOMY  1987    There were no vitals filed for this visit.   Subjective Assessment - 03/04/17 1523    Subjective  Pt states when she touches her neck it sends  pins and needles sensation in her head; states if she is around someone who smokes it gives her funny sensations in her head; balance is better since dizziness is now gone- pt states she quit taking aspirin in addition to taking the Plavix and this change resolved the dizziness (MD instructed her to not take aspirin and the Plavix)  Pt is scheduled to have TKR on 03-30-17    Pertinent History  pt reports she has goiters that are growing down in her neck; just diagnosed with bil. CTS     Patient Stated Goals  Decrease neck pain and tightness, gain strength all over, improve posture         [facility] PT Assessment - 03/04/17 0001      Assessment   Medical Diagnosis  Dr. Rosalin Hawking    Referring Provider  Neuropathy:  Cervical radiculopathy    Onset Date/Surgical Date  -- Dec. 2018      Balance Screen   Has the patient fallen in the past 6 months  Yes    How many times?  2    Has the patient had a decrease in activity level because of a fear of falling?   Yes    Is the patient reluctant to leave their home because  of a fear of falling?   Yes      [place] residence    Type of [place] Access  Stairs to enter    Entrance Stairs-Number of Steps  3    Entrance Stairs-Rails  None    Home Layout  Two level      Prior Function   Level of Independence  Independent      Posture/Postural Control   Posture Comments  pt states she leans forward most of the time in seated position because it relieves some of back pain       ROM / Strength   AROM / PROM / Strength  AROM;Strength      AROM   AROM Assessment Site  Cervical    Cervical Flexion  WFL    Cervical Extension  WFL    Cervical - Right Side Bend  WFL    Cervical - Left Side Bend  WFL    Cervical - Right Rotation  WFL    Cervical - Left Rotation  WFL      Strength   Overall Strength  Within functional limits for tasks performed    Overall Strength Comments  Cervical strength       Palpation   Palpation comment  Pt has point tenderness due to trigger points in bil. upper traps with Rt worse than Lt          Pt declines use of assistive device at this time     Objective measurements completed on examination: See above findings.              PT Education - 03/04/17 1542    Education provided  Yes    Education Details  pt instructed in cervical retraction ex    Person(s) Educated  Patient    Methods  Explanation;Demonstration;Handout    Comprehension  Verbalized understanding;Returned demonstration          PT Long Term Goals - 03/04/17 1558      PT LONG TERM GOAL #1   Title  Pt will report reduced cervical pain to </= 1/10 at rest for incr. ease with ADL's and with head turns during driving.    Baseline  2/10 reported on 03-03-17 at rest    Time  3    Period  Weeks    Status  New    Target Date  04/01/17      PT LONG TERM GOAL #2   Title  Pt will be independent in HEP for cervical ROM and stretching.     Time  3    Period  Weeks    Status  New    Target Date  04/01/17             Plan - 03/04/17 1544    Clinical Impression Statement  Pt is a 67 yr old lady with c/o cervical pain, UE weakness and postural dysfunction due to cervical radiculopathy.  Pt also has peripheral neuropathy with balance deficits, however, pt is scheduled to undergo Lt TKR on 03-30-17. Lt knee pain is currently contributing to balance and gait deficits.  Pt has point tenderness with palpation of bilateral upper traps, with Rt more tender than Lt.  Cervical AROM is WFL's.  Pt presents with posture deviations including forward head and rounded shoulders and decreased ability to sit upright due to c/o back pain.    History and Personal Factors relevant to  plan of care:  pt scheduled for Lt TKR on 03-30-17; pt states she was just diagnosed with CTS prior to this PT appt (just saw neurologist prior to PT)     Clinical Presentation  Evolving    Clinical Presentation  due to:  cervical radiculopathy, peripheral neuropathy    Clinical Decision Making  Moderate    Rehab Potential  Good    PT Frequency  2x / week    PT Duration  3 weeks due to sx scheduled on 03-30-17 for Lt TKA    PT Treatment/Interventions  ADLs/Self Care Home Management;Electrical Stimulation;Moist Heat;Traction;Ultrasound;Therapeutic activities;Therapeutic exercise;Manual techniques;Balance training;Neuromuscular re-education;Patient/family education;Dry needling;Passive range of motion    PT Next Visit Plan  modalities for cervical pain/upper trap tightness -- E-stim/ultrasound; try dry needling; HEP for cervical ROM, posture exercises    PT Home Exercise Plan  cervical retraction given on 03-03-17    Recommended Other Services  dry needling bil. upper trap/cervical region    Consulted and Agree with Plan of Care  Patient       Patient will benefit from skilled therapeutic intervention in order to improve the following deficits and impairments:  Pain, Impaired sensation, Abnormal gait, Decreased balance, Postural dysfunction, Decreased strength  Visit Diagnosis: Cervicalgia  Abnormal posture     Problem List Patient Active Problem List   Diagnosis Date Noted  . Acute asthma exacerbation 08/19/2016  . Preoperative cardiovascular examination 08/16/2016  . Cervicalgia 03/23/2016  . Acute back pain with sciatica, right 01/14/2016  . Vertigo 04/14/2015  . Anxiety state 01/22/2015  . HLD (hyperlipidemia) 01/22/2015  . Type 2 diabetes mellitus with circulatory disorder ([place]) 01/22/2015  . Intracranial vascular stenosis 01/22/2015  . Aneurysm ([place])   . Headache   . Diabetes mellitus with complication ([place]) 29/92/4268  . Essential hypertension 11/12/2014  . Dependent edema 11/12/2014  . Peripheral neuropathy 11/12/2014  . Depression 11/12/2014  . Facial droop   . Chest pain   . TIA (transient ischemic attack) 11/11/2014  . Dyspnea 08/06/2014  . Hyperthyroidism 08/06/2014  .  Meniere's disease 06/07/2014  . History of CHF (congestive heart failure) 06/07/2014  . Essential hypertension, benign 06/13/2013  . Goiter 06/13/2013  . Obesity, unspecified 06/13/2013    Alda Lea, PT 03/04/2017, 4:04 PM  [facility] [address] [place], [place], [address] Phone: [phone]   Fax:  [phone]  Name: Sherry [name] MRN: [id number] Date of Birth: 11/04/1950

## 2017-03-04 NOTE — Procedures (Signed)
   [facility]  NCS (NERVE CONDUCTION STUDY) WITH EMG (ELECTROMYOGRAPHY) REPORT   STUDY DATE: 03/03/17 PATIENT NAME: Sherry [name] DOB: 1950-06-03 MRN: [id number]  ORDERING CLINICIAN: Rosalin Hawking, MD PhD   TECHNOLOGIST: Oneita Jolly ELECTROMYOGRAPHER: Earlean Polka. Jodine Muchmore, MD  CLINICAL INFORMATION: 67 year old here for evaluation of bilateral hand numbness.  FINDINGS: NERVE CONDUCTION STUDY: Bilateral median motor responses have prolonged distal latencies (left 5.6 ms, right 6.6 ms), normal amplitude on the left, decreased amplitude on the right, and normal conduction velocities.  Bilateral ulnar motor responses and F wave latencies are normal.  Of note  A right sided median to ulnar anastomosis (Martin-Gruber type II) is noted.  This is a normal variant.  Left median sensory response could not be obtained.  Right median sensory response could not be obtained.  Bilateral ulnar sensory responses are normal.  NEEDLE ELECTROMYOGRAPHY:  Needle examination of right upper extremity deltoid, biceps, triceps, flexor carpi radialis, first dorsal interosseous is normal.    IMPRESSION:   Abnormal study demonstrating: -Bilateral median neuropathies at the wrists consistent with bilateral carpal tunnel syndrome.    INTERPRETING PHYSICIAN:  Penni Bombard, MD Certified in Neurology, Neurophysiology and Neuroimaging  [facility] [address] [place], [place] [address] [phone]

## 2017-03-07 ENCOUNTER — Ambulatory Visit: Payer: Medicare Other | Admitting: Physical Therapy

## 2017-03-07 ENCOUNTER — Telehealth: Payer: Self-pay

## 2017-03-07 NOTE — Telephone Encounter (Signed)
Notes recorded by Marval Regal, RN on 03/07/2017 at 5:42 PM EST Left vm for patient to call back about EMG results. ------

## 2017-03-07 NOTE — Telephone Encounter (Signed)
-----   Message from Rosalin Hawking, MD sent at 03/06/2017  6:26 AM EST ----- Could you please let the patient know that the nerve conduction test done recently in our office showed bilateral carpal tunnel syndrome. Recommend bilateral wrist brace use at night. Pt can buy from the medical supply store. Please continue current treatment. Thanks.  Rosalin Hawking, MD PhD Stroke Neurology 03/06/2017 6:26 AM

## 2017-03-08 NOTE — Telephone Encounter (Signed)
Notes recorded by Marval Regal, RN on 03/08/2017 at 12:06 PM EST Rn call patient about her EMG results. Rn stated the nerve conduction test showed bilateral carpal tunnel syndrome. Its recommend bilateral wrist brace at night. Rn stated she can buy from any medical supply store. Pt verbalized understanding. ------

## 2017-03-08 NOTE — Telephone Encounter (Signed)
Pt returned RN's call °

## 2017-03-09 ENCOUNTER — Emergency Department (HOSPITAL_COMMUNITY): Payer: Medicare Other

## 2017-03-09 ENCOUNTER — Encounter (HOSPITAL_COMMUNITY): Payer: Self-pay | Admitting: Emergency Medicine

## 2017-03-09 ENCOUNTER — Emergency Department (HOSPITAL_COMMUNITY)
Admission: EM | Admit: 2017-03-09 | Discharge: 2017-03-09 | Disposition: A | Discharge status: Home / Self Care | Payer: Medicare Other | Attending: Emergency Medicine | Admitting: Emergency Medicine

## 2017-03-09 DIAGNOSIS — E1159 Type 2 diabetes mellitus with other circulatory complications: Secondary | ICD-10-CM | POA: Insufficient documentation

## 2017-03-09 DIAGNOSIS — Z7984 Long term (current) use of oral hypoglycemic drugs: Secondary | ICD-10-CM | POA: Insufficient documentation

## 2017-03-09 DIAGNOSIS — R0981 Nasal congestion: Secondary | ICD-10-CM | POA: Diagnosis not present

## 2017-03-09 DIAGNOSIS — R509 Fever, unspecified: Secondary | ICD-10-CM | POA: Diagnosis present

## 2017-03-09 DIAGNOSIS — Z7902 Long term (current) use of antithrombotics/antiplatelets: Secondary | ICD-10-CM | POA: Insufficient documentation

## 2017-03-09 DIAGNOSIS — Z79899 Other long term (current) drug therapy: Secondary | ICD-10-CM | POA: Insufficient documentation

## 2017-03-09 DIAGNOSIS — I110 Hypertensive heart disease with heart failure: Secondary | ICD-10-CM | POA: Insufficient documentation

## 2017-03-09 DIAGNOSIS — J45909 Unspecified asthma, uncomplicated: Secondary | ICD-10-CM | POA: Diagnosis not present

## 2017-03-09 DIAGNOSIS — I509 Heart failure, unspecified: Secondary | ICD-10-CM | POA: Insufficient documentation

## 2017-03-09 DIAGNOSIS — Z96651 Presence of right artificial knee joint: Secondary | ICD-10-CM | POA: Diagnosis not present

## 2017-03-09 DIAGNOSIS — J101 Influenza due to other identified influenza virus with other respiratory manifestations: Secondary | ICD-10-CM | POA: Insufficient documentation

## 2017-03-09 DIAGNOSIS — R05 Cough: Secondary | ICD-10-CM | POA: Diagnosis not present

## 2017-03-09 DIAGNOSIS — R0602 Shortness of breath: Secondary | ICD-10-CM | POA: Diagnosis not present

## 2017-03-09 LAB — URINALYSIS, ROUTINE W REFLEX MICROSCOPIC
Bacteria, UA: NONE SEEN
Bilirubin Urine: NEGATIVE
Glucose, UA: >=500 mg/dL — AB
Ketones, ur: 5 mg/dL — AB
Leukocytes, UA: NEGATIVE
Nitrite: NEGATIVE
Protein, ur: NEGATIVE mg/dL
Specific Gravity, Urine: 1.021 (ref 1.005–1.030)
WBC, UA: NONE SEEN WBC/hpf (ref 0–5)
pH: 6 (ref 5.0–8.0)

## 2017-03-09 LAB — I-STAT CG4 LACTIC ACID, ED
Lactic Acid, Venous: 2.4 mmol/L (ref 0.5–1.9)
Lactic Acid, Venous: 1.86 mmol/L (ref 0.5–1.9)

## 2017-03-09 LAB — CBC WITH DIFFERENTIAL/PLATELET
Basophils Absolute: 0 10*3/uL (ref 0.0–0.1)
Basophils Relative: 0 %
Eosinophils Absolute: 0 10*3/uL (ref 0.0–0.7)
Eosinophils Relative: 0 %
HCT: 46.9 % — ABNORMAL HIGH (ref 36.0–46.0)
Hemoglobin: 14.9 g/dL (ref 12.0–15.0)
Lymphocytes Relative: 11 %
Lymphs Abs: 1.1 10*3/uL (ref 0.7–4.0)
MCH: 28.5 pg (ref 26.0–34.0)
MCHC: 31.8 g/dL (ref 30.0–36.0)
MCV: 89.7 fL (ref 78.0–100.0)
Monocytes Absolute: 0.4 10*3/uL (ref 0.1–1.0)
Monocytes Relative: 4 %
Neutro Abs: 8.2 10*3/uL — ABNORMAL HIGH (ref 1.7–7.7)
Neutrophils Relative %: 85 %
Platelets: 234 10*3/uL (ref 150–400)
RBC: 5.23 MIL/uL — ABNORMAL HIGH (ref 3.87–5.11)
RDW: 14.3 % (ref 11.5–15.5)
WBC: 9.8 10*3/uL (ref 4.0–10.5)

## 2017-03-09 LAB — BASIC METABOLIC PANEL
Anion gap: 14 (ref 5–15)
BUN: 9 mg/dL (ref 6–20)
CO2: 22 mmol/L (ref 22–32)
Calcium: 8.6 mg/dL — ABNORMAL LOW (ref 8.9–10.3)
Chloride: 104 mmol/L (ref 101–111)
Creatinine, Ser: 0.66 mg/dL (ref 0.44–1.00)
GFR calc Af Amer: >60 mL/min (ref >60)
GFR calc non Af Amer: >60 mL/min (ref >60)
Glucose, Bld: 123 mg/dL — ABNORMAL HIGH (ref 65–99)
Potassium: 4.6 mmol/L (ref 3.5–5.1)
Sodium: 140 mmol/L (ref 135–145)

## 2017-03-09 LAB — INFLUENZA PANEL BY PCR (TYPE A & B)
Influenza A By PCR: POSITIVE — AB
Influenza B By PCR: NEGATIVE

## 2017-03-09 MED ORDER — ACETAMINOPHEN 500 MG PO TABS
1000.0000 mg | ORAL_TABLET | Freq: Once | ORAL | Status: AC
  Administered 2017-03-09: 1000 mg via ORAL
  Filled 2017-03-09: qty 2

## 2017-03-09 MED ORDER — SODIUM CHLORIDE 0.9 % IV BOLUS (SEPSIS)
1000.0000 mL | Freq: Once | INTRAVENOUS | Status: AC
  Administered 2017-03-09: 1000 mL via INTRAVENOUS

## 2017-03-09 MED ORDER — OSELTAMIVIR PHOSPHATE 75 MG PO CAPS: 75.0000 mg | ORAL_CAPSULE | Freq: Two times a day (BID) | ORAL | 0 refills | Status: DC

## 2017-03-09 MED ORDER — OSELTAMIVIR PHOSPHATE 75 MG PO CAPS
75.0000 mg | ORAL_CAPSULE | Freq: Once | ORAL | Status: AC
Start: 2017-03-09 — End: 2017-03-09
  Administered 2017-03-09: 75 mg via ORAL
  Filled 2017-03-09: qty 1

## 2017-03-09 MED ORDER — ASPIRIN 81 MG PO CHEW
324.0000 mg | CHEWABLE_TABLET | Freq: Once | ORAL | Status: AC
  Administered 2017-03-09: 324 mg via ORAL
  Filled 2017-03-09: qty 4

## 2017-03-09 MED ORDER — BENZONATATE 100 MG PO CAPS: 100.0000 mg | ORAL_CAPSULE | Freq: Three times a day (TID) | ORAL | 0 refills | Status: DC

## 2017-03-09 NOTE — Discharge Instructions (Signed)
You tested positive for influenza today. Take tamiflu as prescribed until all gone. Take tessalon for cough as prescribed as needed. Rest. Drink plenty of fluids. Follow up with family doctor in 2-3 days for recheck. Return if worsening. Avoid contact with others. Wash hands frequently.

## 2017-03-09 NOTE — ED Provider Notes (Signed)
[facility] EMERGENCY DEPARTMENT Provider Note   CSN: [id number] Arrival date & time: 03/09/17  1608     History   Chief Complaint Chief Complaint  Patient presents with  . Shortness of Breath  . Cough    HPI Sherry [name] is a 66 y.o. female.  HPI Sherry [name] is a 66 y.o. female with a history of diabetes, hypertension, CVA, presents to emergency department complaining of flulike symptoms.  Patient states she has developed body aches, fever, cough, congestion, onset this morning.  She states that she tried to use her inhaler which did not help.  She denies taking any other medications.  She reports mild headache.  Denies neck pain or stiffness.  She denies any nausea, vomiting, diarrhea.  No sore throat.  No abdominal pain.  The patient has anaphylaxis to flu vaccine and did not receive it this year.  Denies any sick contacts.  Past Medical History:  Diagnosis Date  . Cerebral aneurysm   . Complication of anesthesia HYPOTENSION INTRAOP W/ HYSTERECTOMY IN 1987--  DID OK W/ TOTAL KNEE IN 2008  . Diabetes mellitus   . DJD (degenerative joint disease)   . Dyslipidemia   . GERD (gastroesophageal reflux disease)   . H/O hiatal hernia   . History of CHF (congestive heart failure) 2010-  RESOLVED  . History of peptic ulcer YRS AGO  . Hypertension   . Meniere's disease   . Mitral valve prolapse occasional palpitations w/ chest discomfort  . OA (osteoarthritis)   . Rotator cuff tear, left   . Stroke [facility])     Patient Active Problem List   Diagnosis Date Noted  . Acute asthma exacerbation 08/19/2016  . Preoperative cardiovascular examination 08/16/2016  . Cervicalgia 03/23/2016  . Acute back pain with sciatica, right 01/14/2016  . Vertigo 04/14/2015  . Anxiety state 01/22/2015  . HLD (hyperlipidemia) 01/22/2015  . Type 2 diabetes mellitus with circulatory disorder ([place]) 01/22/2015  . Intracranial vascular stenosis 01/22/2015  . Aneurysm ([place])   .  Headache   . Diabetes mellitus with complication ([place]) 36/64/4034  . Essential hypertension 11/12/2014  . Dependent edema 11/12/2014  . Peripheral neuropathy 11/12/2014  . Depression 11/12/2014  . Facial droop   . Chest pain   . TIA (transient ischemic attack) 11/11/2014  . Dyspnea 08/06/2014  . Hyperthyroidism 08/06/2014  . Meniere's disease 06/07/2014  . History of CHF (congestive heart failure) 06/07/2014  . Essential hypertension, benign 06/13/2013  . Goiter 06/13/2013  . Obesity, unspecified 06/13/2013    Past Surgical History:  Procedure Laterality Date  . BUNIONECTOMY  2007   LEFT FOOT  . LEFT SHOULDER SURGERY  1997   ROTATOR CUFF REPAIR  . SHOULDER ARTHROSCOPY  09/20/2011   Procedure: ARTHROSCOPY SHOULDER;  Surgeon: Johnn Hai, MD;  [facility];  Service: Orthopedics;  Laterality: Left;  SAD AND MINI OPEN ROTATOR CUFF REPAIR    . TOTAL KNEE ARTHROPLASTY  09-12-2006  [facility]   RIGHT KNEE  . TUBAL LIGATION  1976  . TYMPANOPLASTY  1990;   1980  . VAGINAL HYSTERECTOMY  1987    OB History    Gravida Para Term Preterm AB Living   6 4 4   2 4    SAB TAB Ectopic Multiple Live Births     2             Home Medications    Prior to Admission medications   Medication Sig Start Date End Date  Taking? Authorizing Provider  albuterol (PROAIR HFA) 108 (90 Base) MCG/ACT inhaler Inhale 1-2 puffs into the lungs every 6 (six) hours as needed for wheezing or shortness of breath. 12/07/16  Yes Copland, Gay Filler, MD  albuterol (PROVENTIL) (2.5 MG/3ML) 0.083% nebulizer solution Take 3 mLs (2.5 mg total) by nebulization every 6 (six) hours as needed for wheezing or shortness of breath. 07/16/16  Yes Copland, Gay Filler, MD  cetirizine (ZYRTEC) 10 MG tablet Take 1 tablet (10 mg total) by mouth daily. 07/16/16  Yes Copland, Gay Filler, MD  clopidogrel (PLAVIX) 75 MG tablet Take 1 tablet (75 mg total) by mouth daily. 07/16/16  Yes Copland, Gay Filler, MD  docusate  sodium (COLACE) 100 MG capsule Take 1 capsule (100 mg total) by mouth daily. 07/16/16  Yes Copland, Gay Filler, MD  famotidine (PEPCID) 10 MG tablet Take 10 mg by mouth daily.   Yes [provider]  fluticasone (FLOVENT HFA) 220 MCG/ACT inhaler Inhale 1 puff into the lungs 2 (two) times daily. May increase to 2 puffs if needed 08/25/16  Yes Copland, Gay Filler, MD  furosemide (LASIX) 20 MG tablet Take 1 tab daily prn swelling/weight gain. Patient taking differently: Take 20 mg by mouth daily. Take 1 tab daily prn swelling/weight gain. 09/15/16  Yes Burtis Junes, NP  gabapentin (NEURONTIN) 300 MG capsule TAKE 2 CAPSULES BY MOUTH  TWO TIMES DAILY Patient taking differently: TAKE 600 mg CAPSULES BY MOUTH  three TIMES DAILY 02/28/17  Yes Copland, Gay Filler, MD  ipratropium (ATROVENT) 0.03 % nasal spray Place 2 sprays into the nose 4 (four) times daily. 07/16/16  Yes Copland, Gay Filler, MD  JARDIANCE 10 MG TABS tablet TAKE 1 TABLET BY MOUTH  DAILY Patient taking differently: TAKE 10 mg TABLET BY MOUTH  DAILY 02/28/17  Yes Copland, Gay Filler, MD  lisinopril (PRINIVIL,ZESTRIL) 20 MG tablet Take 1 tablet (20 mg total) by mouth daily. 07/16/16  Yes Copland, Gay Filler, MD  metFORMIN (GLUCOPHAGE) 500 MG tablet Take 2 tablets (1,000 mg total) by mouth 2 (two) times daily. TAKE 2 TABLETS(1000 MG) BY MOUTH TWICE DAILY WITH A MEAL 07/16/16  Yes Copland, Gay Filler, MD  metoprolol tartrate (LOPRESSOR) 25 MG tablet Take 1 tablet (25 mg total) by mouth 2 (two) times daily. 07/16/16  Yes Copland, Gay Filler, MD  mometasone [facility]) 220 MCG/INH inhaler Use 1 puff daily followed by rinsing your mouth out with water. Patient taking differently: Inhale 1 puff into the lungs as needed. \ 06/24/14  Yes Daub, Loura Back, MD  montelukast (SINGULAIR) 10 MG tablet Take 1 tablet (10 mg total) by mouth at bedtime. 12/06/16  Yes Copland, Gay Filler, MD  QUEtiapine (SEROQUEL) 100 MG tablet Take 0.5 tablets (50 mg total) by mouth at bedtime.  07/16/16  Yes Copland, Gay Filler, MD  sertraline (ZOLOFT) 50 MG tablet Take 0.5 tablets (25 mg total) by mouth at bedtime. 07/16/16  Yes Copland, Gay Filler, MD  linagliptin (TRADJENTA) 5 MG TABS tablet Take 0.5 tablets (2.5 mg total) by mouth daily. Patient not taking: Reported on 03/09/2017 07/16/16   Copland, Gay Filler, MD    Family History Family History  Problem Relation Age of Onset  . Heart disease Mother        in her 57s, heart attack  . Diabetes Mother   . Kidney disease Mother        dialysis  . Thyroid disease Mother   . Aneurysm Mother   . Ovarian cancer Mother   .  Breast cancer Mother   . Thyroid disease Sister   . Heart disease Sister   . Lymphoma Father   . Diabetes Father   . Hypertension Father   . Congestive Heart Failure Father   . Thyroid disease Brother   . Thyroid disease Paternal Grandmother   . Heart attack Sister        at age 40  . Stroke Sister   . Hypertension Sister   . Breast cancer Maternal Aunt   . Breast cancer Maternal Grandmother   . Breast cancer Maternal Aunt   . Breast cancer Maternal Aunt   . Other Brother        Musician accident  . Bipolar disorder Son   . Other Son        GSW    Social History Social History   Tobacco Use  . Smoking status: Never Smoker  . Smokeless tobacco: Never Used  Substance Use Topics  . Alcohol use: No  . Drug use: No     Allergies   Influenza vaccines; Codeine; Demerol; Influenza vac split quad; Mango flavor [flavoring agent]; Nsaids; Prednisone; and Tramadol   Review of Systems Review of Systems  Constitutional: Positive for fever. Negative for chills.  HENT: Positive for congestion. Negative for sore throat.   Respiratory: Positive for cough and shortness of breath. Negative for chest tightness.   Cardiovascular: Negative for chest pain, palpitations and leg swelling.  Gastrointestinal: Negative for abdominal pain, diarrhea, nausea and vomiting.  Genitourinary: Negative for dysuria, flank pain,  pelvic pain, vaginal bleeding, vaginal discharge and vaginal pain.  Musculoskeletal: Negative for arthralgias, myalgias, neck pain and neck stiffness.  Skin: Negative for rash.  Neurological: Positive for headaches. Negative for dizziness and weakness.  All other systems reviewed and are negative.    Physical Exam Updated Vital Signs BP (!) 154/84   Pulse 90   Temp (!) 101.9 F (38.8 C) (Oral)   Resp (!) 26   SpO2 96%   Physical Exam  Constitutional: She appears well-developed and well-nourished. No distress.  HENT:  Head: Normocephalic.  Right Ear: Tympanic membrane, external ear and ear canal normal.  Left Ear: Tympanic membrane, external ear and ear canal normal.  Nose: Mucosal edema and rhinorrhea present.  Mouth/Throat: Uvula is midline and oropharynx is clear and moist. No oropharyngeal exudate or posterior oropharyngeal edema.  Eyes: Conjunctivae are normal.  Neck: Normal range of motion. Neck supple.  No meningismus  Cardiovascular: Normal rate, regular rhythm and normal heart sounds.  Pulmonary/Chest: Effort normal and breath sounds normal. No respiratory distress. She has no wheezes. She has no rales.  coughing  Abdominal: Soft. Bowel sounds are normal. She exhibits no distension. There is no tenderness. There is no rebound.  Musculoskeletal: She exhibits no edema.  Neurological: She is alert.  Skin: Skin is warm and dry.  Psychiatric: She has a normal mood and affect. Her behavior is normal.  Nursing note and vitals reviewed.    ED Treatments / Results  Labs (all labs ordered are listed, but only abnormal results are displayed) Labs Reviewed  CBC WITH DIFFERENTIAL/PLATELET - Abnormal; Notable for the following components:      Result Value   RBC 5.23 (*)    HCT 46.9 (*)    Neutro Abs 8.2 (*)    All other components within normal limits  BASIC METABOLIC PANEL - Abnormal; Notable for the following components:   Glucose, Bld 123 (*)    Calcium 8.6 (*)  All other components within normal limits  URINALYSIS, ROUTINE W REFLEX MICROSCOPIC - Abnormal; Notable for the following components:   Color, Urine STRAW (*)    Glucose, UA >=500 (*)    Hgb urine dipstick SMALL (*)    Ketones, ur 5 (*)    Squamous Epithelial / LPF 0-5 (*)    All other components within normal limits  I-STAT CG4 LACTIC ACID, ED - Abnormal; Notable for the following components:   Lactic Acid, Venous 2.40 (*)    All other components within normal limits  CULTURE, BLOOD (ROUTINE X 2)  CULTURE, BLOOD (ROUTINE X 2)  INFLUENZA PANEL BY PCR (TYPE A & B)  I-STAT CG4 LACTIC ACID, ED    EKG  EKG Interpretation  Date/Time:  Wednesday March 09 2017 16:16:09 EST Ventricular Rate:  94 PR Interval:    QRS Duration: 91 QT Interval:  371 QTC Calculation: 464 R Axis:   96 Text Interpretation:  Sinus rhythm Right axis deviation Nonspecific T abnormalities, anterior leads When compared to prior, no signifiant changes seen.. No STEMI Confirmed by Antony Blackbird [phone]) on 03/09/2017 4:38:05 PM       Radiology Dg Chest 2 View  Result Date: 03/09/2017 CLINICAL DATA:  Shortness of breath and nonproductive cough EXAM: CHEST  2 VIEW COMPARISON:  08/19/2016 FINDINGS: The heart size and mediastinal contours are within normal limits. Both lungs are clear. The visualized skeletal structures are unremarkable. IMPRESSION: No active cardiopulmonary disease. Electronically Signed   By: Inez Catalina M.D.   On: 03/09/2017 17:00    Procedures Procedures (including critical care time)  Medications Ordered in ED Medications  acetaminophen (TYLENOL) tablet 1,000 mg (not administered)     Initial Impression / Assessment and Plan / ED Course  I have reviewed the triage vital signs and the nursing notes.  Pertinent labs & imaging results that were available during my care of the patient were reviewed by me and considered in my medical decision making (see chart for details).     Patient  to the emergency department with flulike symptoms that started this morning.  She does have a fever 101.9, heart rate is in the 90s at this time to my evaluation, she is slightly tachypneic with respiratory rate in mid 20s.  Blood pressure slightly elevated.  She is overall nontoxic appearing, not do not think that she is septic.  We will check labs including lactate, get blood cultures, get chest x-ray.  Will give Tylenol for fever.  Influenza panel ordered as well.  8:07 PM Patient feels about the same.  Repeat lactic acid is 1.86, after original was 2.4.  I will give her some more IV fluids.  Her temperature did go up to 102, she has already had Tylenol, and unable to tolerate NSAIDs.  Influenza panel pending.  Will sign out pending results and reassessment.  Vitals:   03/09/17 1930 03/09/17 1945 03/09/17 2000 03/09/17 2006  BP: (!) 153/79 (!) 144/76 137/70   Pulse: 95 (!) 103 (!) 102   Resp: 18 20 (!) 22   Temp:    (!) 101.5 F (38.6 C)  TempSrc:    Oral  SpO2: 98% 95% 96%      Final Clinical Impressions(s) / ED Diagnoses   Final diagnoses:  None    ED Discharge Orders    None       Janee Morn 03/09/17 2041    Tegeler, Gwenyth Allegra, MD 03/09/17 2209

## 2017-03-09 NOTE — ED Notes (Signed)
Pt stable, ambulatory, and verbalizes understanding of d/c instructions.  

## 2017-03-09 NOTE — ED Triage Notes (Signed)
Pt BIB EMS from home, c/o increased shortness of breath and non productive cough that started this morning. Used home inhaler with no change. LS clear. Respirations e/u. EMS vitals: BP 164/68, HR 90, RR 18, SpO2 99% room air, CBG 134

## 2017-03-10 MED FILL — OSELTAMIVIR PHOS 75 MG CAP: 75 | 5 days supply | Qty: 10 | Fill #0

## 2017-03-11 ENCOUNTER — Ambulatory Visit: Payer: Medicare Other | Admitting: Physical Therapy

## 2017-03-12 NOTE — Progress Notes (Signed)
[place] [facility] [address] [place], [place] [address] [phone]  Date:  03/17/2017   Name:  Sherry [name]   DOB:  [birth year]   MRN:  [id number]  PCP:  Darreld Mclean, MD    Chief Complaint: Hospitalization Follow-up (Pt here for hosp f/u visit. Pt states that she still has a prod cough with thick white mucus. )   History of Present Illness:  Sherry [name] is a 67 y.o. very pleasant female patient who presents with the following:  She was in the ER on 1/30 with the flu She tested positive for flu A She had a blood culture collected which is negative Treated with tamiflu- she finished this yesterday She is feeling better,but not yet well She is still coughing up some mucus She is drinking plenty of liquids No fever the last few days Chills and body aches are better She still has diarrhea- has noted this for about 3 weeks now.   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 Acute asthma exacerbation 08/19/2016  . Preoperative cardiovascular examination 08/16/2016  . Cervicalgia 03/23/2016  . Acute back pain with sciatica, right 01/14/2016  . Vertigo 04/14/2015  . Anxiety state 01/22/2015  . HLD (hyperlipidemia) 01/22/2015  . Type 2 diabetes mellitus with circulatory disorder ([place]) 01/22/2015  . Intracranial vascular stenosis 01/22/2015  . Aneurysm ([place])   . Headache   . Diabetes mellitus with complication ([place]) 40/34/7425  . Essential hypertension 11/12/2014  . Dependent edema 11/12/2014  . Peripheral neuropathy 11/12/2014  . Depression  11/12/2014  . Facial droop   . Chest pain   . TIA (transient ischemic attack) 11/11/2014  . Dyspnea 08/06/2014  . Hyperthyroidism 08/06/2014  . Meniere's disease 06/07/2014  . History of CHF (congestive heart failure) 06/07/2014  . Essential hypertension, benign 06/13/2013  . Goiter 06/13/2013  . Obesity, unspecified 06/13/2013    Past Medical History:  Diagnosis Date  . Cerebral aneurysm   . Complication of anesthesia HYPOTENSION INTRAOP W/ HYSTERECTOMY IN 1987--  DID OK W/ TOTAL KNEE IN 2008  . Diabetes mellitus   . DJD (degenerative joint disease)   . Dyslipidemia   . GERD (gastroesophageal reflux disease)   . H/O hiatal hernia   . History of CHF (congestive heart failure) 2010-  RESOLVED  . History of peptic ulcer YRS AGO  . Hypertension   . Meniere's disease   . Mitral valve prolapse occasional palpitations w/ chest discomfort  . OA (osteoarthritis)   . Rotator cuff tear, left   . Stroke [facility])     Past Surgical History:  Procedure Laterality Date  . BUNIONECTOMY  2007   LEFT FOOT  . LEFT SHOULDER SURGERY  1997   ROTATOR CUFF REPAIR  . SHOULDER ARTHROSCOPY  09/20/2011   Procedure: ARTHROSCOPY SHOULDER;  Surgeon: Johnn Hai, MD;  Location: [facility];  Service: Orthopedics;  Laterality: Left;  SAD AND MINI OPEN ROTATOR CUFF REPAIR    . TOTAL  KNEE ARTHROPLASTY  09-12-2006  [facility]   RIGHT KNEE  . TUBAL LIGATION  1976  . TYMPANOPLASTY  1990;   1980  . VAGINAL HYSTERECTOMY  1987    Social History   Tobacco Use  . Smoking status: Never Smoker  . Smokeless tobacco: Never Used  Substance Use Topics  . Alcohol use: No  . Drug use: No    Family History  Problem Relation Age of Onset  . Heart disease Mother        in her 22s, heart attack  . Diabetes Mother   . Kidney disease Mother        dialysis  . Thyroid disease Mother   . Aneurysm Mother   . Ovarian cancer Mother   . Breast cancer Mother   . Thyroid disease Sister   . Heart  disease Sister   . Lymphoma Father   . Diabetes Father   . Hypertension Father   . Congestive Heart Failure Father   . Thyroid disease Brother   . Thyroid disease Paternal Grandmother   . Heart attack Sister        at age 63  . Stroke Sister   . Hypertension Sister   . Breast cancer Maternal Aunt   . Breast cancer Maternal Grandmother   . Breast cancer Maternal Aunt   . Breast cancer Maternal Aunt   . Other Brother        Musician accident  . Bipolar disorder Son   . Other Son        GSW    Allergies  Allergen Reactions  . Influenza Vaccines Anaphylaxis  . Codeine Itching  . Demerol Other (See Comments)    SEIZURE  . Influenza Vac Split Quad   . Mango Flavor [Flavoring Agent] Itching  . Nsaids Other (See Comments)    NAPROXEN, IBUPROFEN, SKELAXIN---  CAUSES PALPITATIONS  . Prednisone     Caused her glucose to go up to 500 and went to ED  . Tramadol     Contraindicated with Victoza     Medication list has been reviewed and updated.  Current Outpatient Medications on File Prior to Visit  Medication Sig Dispense Refill  . albuterol (PROAIR HFA) 108 (90 Base) MCG/ACT inhaler Inhale 1-2 puffs into the lungs every 6 (six) hours as needed for wheezing or shortness of breath. 18 g 6  . albuterol (PROVENTIL) (2.5 MG/3ML) 0.083% nebulizer solution Take 3 mLs (2.5 mg total) by nebulization every 6 (six) hours as needed for wheezing or shortness of breath. 150 mL 1  . cetirizine (ZYRTEC) 10 MG tablet Take 1 tablet (10 mg total) by mouth daily. 90 tablet 3  . clopidogrel (PLAVIX) 75 MG tablet Take 1 tablet (75 mg total) by mouth daily. 90 tablet 3  . famotidine (PEPCID) 10 MG tablet Take 10 mg by mouth daily.    . fluticasone (FLOVENT HFA) 220 MCG/ACT inhaler Inhale 1 puff into the lungs 2 (two) times daily. May increase to 2 puffs if needed 1 Inhaler 12  . furosemide (LASIX) 20 MG tablet Take 1 tab daily prn swelling/weight gain. (Patient taking differently: Take 20 mg by mouth daily.  Take 1 tab daily prn swelling/weight gain.) 90 tablet 3  . gabapentin (NEURONTIN) 300 MG capsule TAKE 2 CAPSULES BY MOUTH  TWO TIMES DAILY (Patient taking differently: TAKE 600 mg CAPSULES BY MOUTH  three TIMES DAILY) 360 capsule 1  . ipratropium (ATROVENT) 0.03 % nasal spray Place 2 sprays into the nose 4 (  four) times daily. 30 mL 6  . JARDIANCE 10 MG TABS tablet TAKE 1 TABLET BY MOUTH  DAILY (Patient taking differently: TAKE 10 mg TABLET BY MOUTH  DAILY) 90 tablet 1  . linagliptin (TRADJENTA) 5 MG TABS tablet Take 0.5 tablets (2.5 mg total) by mouth daily. 45 tablet 3  . lisinopril (PRINIVIL,ZESTRIL) 20 MG tablet Take 1 tablet (20 mg total) by mouth daily. 90 tablet 3  . metFORMIN (GLUCOPHAGE) 500 MG tablet Take 2 tablets (1,000 mg total) by mouth 2 (two) times daily. TAKE 2 TABLETS(1000 MG) BY MOUTH TWICE DAILY WITH A MEAL 360 tablet 3  . metoprolol tartrate (LOPRESSOR) 25 MG tablet Take 1 tablet (25 mg total) by mouth 2 (two) times daily. 180 tablet 3  . montelukast (SINGULAIR) 10 MG tablet Take 1 tablet (10 mg total) by mouth at bedtime. 30 tablet 3  . QUEtiapine (SEROQUEL) 100 MG tablet Take 0.5 tablets (50 mg total) by mouth at bedtime. 45 tablet 3  . sertraline (ZOLOFT) 50 MG tablet Take 0.5 tablets (25 mg total) by mouth at bedtime. 45 tablet 3  . docusate sodium (COLACE) 100 MG capsule Take 1 capsule (100 mg total) by mouth daily. (Patient not taking: Reported on 03/17/2017) 10 capsule 2  . oseltamivir (TAMIFLU) 75 MG capsule Take 1 capsule (75 mg total) by mouth every 12 (twelve) hours. (Patient not taking: Reported on 03/17/2017) 10 capsule 0   No current facility-administered medications on file prior to visit.     Review of Systems:  As per HPI- otherwise negative. No fever or chills No vomiting Diarrhea is not as bad as it was   Physical Examination: Vitals:   03/17/17 1109  BP: 136/80  Pulse: 64  Temp: 98.4 F (36.9 C)  SpO2: 98%   Vitals:   03/17/17 1109  Weight: 187  lb 12.8 oz (85.2 kg)   Body mass index is 35.48 kg/m. Ideal Body Weight:    GEN: WDWN, NAD, Non-toxic, A & O x 3, obese, otherwise looks well  HEENT: Atraumatic, Normocephalic. Neck supple. No masses, No LAD.  Bilateral TM wnl, oropharynx normal.  PEERL,EOMI.   Ears and Nose: No external deformity. CV: RRR, No M/G/R. No JVD. No thrill. No extra heart sounds. PULM: mild bilateral wheezing, no crackles, rhonchi. No retractions. No resp. distress. No accessory muscle use. ABD: S, NT, ND, +BS. No rebound. No HSM. Benign belly EXTR: No c/c/e NEURO Normal gait.  PSYCH: Normally interactive. Conversant. Not depressed or anxious appearing.  Calm demeanor.    Assessment and Plan: Hospital discharge follow-up - Plan: C. difficile GDH and Toxin A/B  Diarrhea, unspecified type - Plan: C. difficile GDH and Toxin A/B, CANCELED: Clostridium difficile EIA  Influenza A  Recent ER visit with flu She is getting better but still not well Called pharmacy and asked them to refill her albuterol inhaler to use prn Continue flovent Will have her check a C diff stool - if negative and if sx persist can consider abx for her chest such as doxycycline    Signed [name], MD

## 2017-03-14 ENCOUNTER — Ambulatory Visit: Payer: Medicare Other | Admitting: Physical Therapy

## 2017-03-14 LAB — CULTURE, BLOOD (ROUTINE X 2)
Culture: NO GROWTH
Special Requests: ADEQUATE

## 2017-03-15 ENCOUNTER — Ambulatory Visit (INDEPENDENT_AMBULATORY_CARE_PROVIDER_SITE_OTHER): Payer: Medicare Other | Admitting: Orthopaedic Surgery

## 2017-03-15 ENCOUNTER — Encounter (INDEPENDENT_AMBULATORY_CARE_PROVIDER_SITE_OTHER): Payer: Self-pay | Admitting: Orthopaedic Surgery

## 2017-03-15 VITALS — BP 146/80 | HR 58

## 2017-03-15 DIAGNOSIS — Z96651 Presence of right artificial knee joint: Secondary | ICD-10-CM | POA: Diagnosis not present

## 2017-03-15 DIAGNOSIS — M1712 Unilateral primary osteoarthritis, left knee: Secondary | ICD-10-CM

## 2017-03-15 DIAGNOSIS — M47812 Spondylosis without myelopathy or radiculopathy, cervical region: Secondary | ICD-10-CM | POA: Diagnosis not present

## 2017-03-15 DIAGNOSIS — G5603 Carpal tunnel syndrome, bilateral upper limbs: Secondary | ICD-10-CM

## 2017-03-15 LAB — CULTURE, BLOOD (ROUTINE X 2)
Culture: NO GROWTH
Special Requests: ADEQUATE

## 2017-03-15 NOTE — Progress Notes (Signed)
Office Visit Note   Patient: Sherry [name]           Date of Birth: 04/20/1950           MRN: [id number] Visit Date: 03/15/2017              Requested by: Darreld Mclean, MD [address] [place], [place] [address] PCP: Darreld Mclean, MD   Assessment & Plan: Visit Diagnoses:  1. Spondylosis without myelopathy or radiculopathy, cervical region   2. S/P total knee replacement, right   3. Unilateral primary osteoarthritis, left knee   4. Bilateral carpal tunnel syndrome     Plan: Cervical MRI scan is reviewed with the patient as well as nerve conduction velocities.  Bilateral carpal tunnel syndrome and multilevel spondylosis without areas of significant compression.  Some foraminal narrowing at C5-6 more on the right than left.  C4-5 has by foraminal narrowing.  Patient scheduled for left total knee arthroplasty 03/30/2017.  Operative procedure discussed.  She is been through total knee arthroplasty on the opposite right knee we reviewed postoperative protocol with therapy, pain control, home health therapy, knee range of motion and quad strengthening.  Questions were elicited and answered.  Follow-Up Instructions: Preop for left total knee arthroplasty.  Treatment for carpal tunnel if her symptoms persist in the night splinting does not improve her symptoms.  Orders:  No orders of the defined types were placed in this encounter.  No orders of the defined types were placed in this encounter.     Procedures: No procedures performed   Clinical Data: No additional findings.   Subjective: Chief Complaint  Patient presents with  . Neck - Pain, Follow-up    MRI review    HPI 67 year old female returns for follow-up with ongoing problems with neck pain pain that radiates adjacent to the scapula.  He is scheduled for left total knee arthroplasty on 03/30/2017 pain swelling in her knee and osteoarthritis.Marland Kitchen  MRI scan has been obtained and is available for  review.  Nerve conduction velocities performed 03/03/1998 19 Showed Bilateral Carpal Tunnel. Syndrome, she is using wrist braces at night.  MRI scan 02/25/2017 is available for review.  Review of Systems dated from 02/09/1998 1914 point view of systems updated.  Positive for asthma, dyspnea, diabetes depression, peripheral neuropathy carpal tunnel syndrome bilateral, previous left rotator cuff repair, left bunionectomy.   Objective: Vital Signs: BP (!) 146/80   Pulse (!) 58   Physical Exam  Constitutional: She is oriented to person, place, and time. She appears well-developed.  HENT:  Head: Normocephalic.  Right Ear: External ear normal.  Left Ear: External ear normal.  Eyes: Pupils are equal, round, and reactive to light.  Neck: No tracheal deviation present. No thyromegaly present.  Cardiovascular: Normal rate.  Pulmonary/Chest: Effort normal.  Abdominal: Soft.  Neurological: She is alert and oriented to person, place, and time.  Skin: Skin is warm and dry.  Psychiatric: She has a normal mood and affect. Her behavior is normal.    Ortho Exam patient has bilateral brachial plexus tenderness.  Negative impingement the shoulders.  Tender along superior medial border of both scapula.  Serratus anterior is active.  Reflexes are 2+ and symmetrical.  Positive Phalen's test right and left.  No thenar atrophy.  Shoulders are stable full extension of the elbows.  Biceps triceps strength is normal.  Normal lower extremity reflexes.  Patient has left knee crepitus mild swelling of the knee is  amatory with a left knee limp.  Right total knee arthroplasty incision is well-healed good stability. Specialty Comments:  No specialty comments available.  Imaging: CLINICAL DATA:  3-4 week history of dizziness, falling and confusion.  EXAM: MRI CERVICAL SPINE WITHOUT CONTRAST  TECHNIQUE: Multiplanar, multisequence MR imaging of the cervical spine was performed. No intravenous contrast was  administered.  COMPARISON:  CT 03/23/2016  FINDINGS: Alignment: Straightening of the normal cervical lordosis.  Vertebrae: No fracture or primary bone lesion.  Cord: No visible cord compression. Prominence of the central canal at the C6-7 level, not categorized as a frank syrinx.  Posterior Fossa, vertebral arteries, paraspinal tissues: Negative  Disc levels:  Foramen magnum is widely patent. Ordinary mild arthritic changes at C1-2 without stenosis.  C2-3: Right-sided predominant uncovertebral hypertrophy. Facet degeneration worse on the left. Mild bilateral bony foraminal encroachment without likely neural compression.  C3-4: Spondylosis with right-sided predominant osteophytes. Right foraminal narrowing that could possibly affect the C4 nerve.  C4-5: Spondylosis with endplate osteophytes and bulging of the disc more prominent towards the right. No compressive central canal narrowing. Foraminal narrowing worse on the right than the left. Either C5 nerve could be affected.  C5-6: Spondylosis with right-sided predominant osteophytes and bulging disc material. No central canal stenosis. Foraminal narrowing on the right that could affect the C6 nerve.  C6-7: Spondylosis with endplate osteophytes and bulging of the disc. No compressive canal stenosis. Foraminal narrowing on the right that could affect the C7 nerve.  C7-T1: Mild bulging of the disc. No significant stenosis. Upper thoracic region also shows disc bulges without likely neural compression.  IMPRESSION: No abnormality seen to explain the clinical presentation. The patient has chronic degenerative spondylosis throughout the cervical region, narrowing the canal but not resulting in cord compression. There are foraminal stenoses, generally more pronounced on the right than on the left, that could cause radicular symptoms, but would not likely cause dizziness or falling.  Slight prominence of the  central canal at the C6-7 level likely insignificant. This is too small to categorize as a syrinx, maximal diameter about 1.5 mm and extending over a length of only 6 mm.   Electronically Signed   By: Nelson Chimes M.D.   On: 02/25/2017 13:26   PMFS History: Patient Active Problem List   Diagnosis Date Noted  . S/P total knee replacement, right 03/29/2017  . Spondylosis without myelopathy or radiculopathy, cervical region 03/29/2017  . Unilateral primary osteoarthritis, left knee 03/29/2017  . Bilateral carpal tunnel syndrome 03/29/2017  . Acute asthma exacerbation 08/19/2016  . Preoperative cardiovascular examination 08/16/2016  . Cervicalgia 03/23/2016  . Acute back pain with sciatica, right 01/14/2016  . Vertigo 04/14/2015  . Anxiety state 01/22/2015  . HLD (hyperlipidemia) 01/22/2015  . Type 2 diabetes mellitus with circulatory disorder ([place]) 01/22/2015  . Intracranial vascular stenosis 01/22/2015  . Aneurysm ([place])   . Headache   . Diabetes mellitus with complication ([place]) 26/71/2458  . Essential hypertension 11/12/2014  . Dependent edema 11/12/2014  . Peripheral neuropathy 11/12/2014  . Depression 11/12/2014  . Facial droop   . Chest pain   . TIA (transient ischemic attack) 11/11/2014  . Dyspnea 08/06/2014  . Hyperthyroidism 08/06/2014  . Meniere's disease 06/07/2014  . History of CHF (congestive heart failure) 06/07/2014  . Essential hypertension, benign 06/13/2013  . Goiter 06/13/2013  . Obesity, unspecified 06/13/2013   Past Medical History:  Diagnosis Date  . Cerebral aneurysm   . Complication of anesthesia HYPOTENSION INTRAOP W/ HYSTERECTOMY IN  1987--  DID OK W/ TOTAL KNEE IN 2008  . Diabetes mellitus   . DJD (degenerative joint disease)   . Dyslipidemia   . GERD (gastroesophageal reflux disease)   . H/O hiatal hernia   . History of CHF (congestive heart failure) 2010-  RESOLVED  . History of peptic ulcer YRS AGO  . Hypertension   . Meniere's disease    . Mitral valve prolapse occasional palpitations w/ chest discomfort  . OA (osteoarthritis)   . Rotator cuff tear, left   . Stroke [facility])     Family History  Problem Relation Age of Onset  . Heart disease Mother        in her 42s, heart attack  . Diabetes Mother   . Kidney disease Mother        dialysis  . Thyroid disease Mother   . Aneurysm Mother   . Ovarian cancer Mother   . Breast cancer Mother   . Thyroid disease Sister   . Heart disease Sister   . Lymphoma Father   . Diabetes Father   . Hypertension Father   . Congestive Heart Failure Father   . Thyroid disease Brother   . Thyroid disease Paternal Grandmother   . Heart attack Sister        at age 49  . Stroke Sister   . Hypertension Sister   . Breast cancer Maternal Aunt   . Breast cancer Maternal Grandmother   . Breast cancer Maternal Aunt   . Breast cancer Maternal Aunt   . Other Brother        Musician accident  . Bipolar disorder Son   . Other Son        GSW    Past Surgical History:  Procedure Laterality Date  . BUNIONECTOMY  2007   LEFT FOOT  . LEFT SHOULDER SURGERY  1997   ROTATOR CUFF REPAIR  . SHOULDER ARTHROSCOPY  09/20/2011   Procedure: ARTHROSCOPY SHOULDER;  Surgeon: Johnn Hai, MD;  Location: [facility];  Service: Orthopedics;  Laterality: Left;  SAD AND MINI OPEN ROTATOR CUFF REPAIR    . TOTAL KNEE ARTHROPLASTY  09-12-2006  [facility]   RIGHT KNEE  . TUBAL LIGATION  1976  . TYMPANOPLASTY  1990;   1980  . VAGINAL HYSTERECTOMY  1987   Social History   Occupational History  . Occupation: LPN - retired  Tobacco Use  . Smoking status: Never Smoker  . Smokeless tobacco: Never Used  Substance and Sexual Activity  . Alcohol use: No  . Drug use: No  . Sexual activity: Not on file

## 2017-03-16 ENCOUNTER — Ambulatory Visit: Payer: Medicare Other | Admitting: Physical Therapy

## 2017-03-16 ENCOUNTER — Other Ambulatory Visit (INDEPENDENT_AMBULATORY_CARE_PROVIDER_SITE_OTHER): Payer: Self-pay

## 2017-03-17 ENCOUNTER — Encounter: Payer: Self-pay | Admitting: Family Medicine

## 2017-03-17 ENCOUNTER — Ambulatory Visit (INDEPENDENT_AMBULATORY_CARE_PROVIDER_SITE_OTHER): Payer: Medicare Other | Admitting: Family Medicine

## 2017-03-17 VITALS — BP 136/80 | HR 64 | Temp 98.4°F | Wt 187.8 lb

## 2017-03-17 DIAGNOSIS — Z09 Encounter for follow-up examination after completed treatment for conditions other than malignant neoplasm: Secondary | ICD-10-CM

## 2017-03-17 DIAGNOSIS — R197 Diarrhea, unspecified: Secondary | ICD-10-CM

## 2017-03-17 DIAGNOSIS — J101 Influenza due to other identified influenza virus with other respiratory manifestations: Secondary | ICD-10-CM

## 2017-03-17 MED FILL — PROAIR HFA 90 MCG INHALER: 108 (90 BAS | 25 days supply | Qty: 9 | Fill #0

## 2017-03-17 NOTE — Patient Instructions (Signed)
Good to see you today-I am glad that you are feeling better!   However, if you don't continue to improve please let me know I had them refill your albuterol inhaler downstairs for you today Also, we will check for any C diff infection of your stool today- just in case we need to use antibiotics for you  Take care!

## 2017-03-18 ENCOUNTER — Other Ambulatory Visit: Payer: Medicare Other

## 2017-03-18 ENCOUNTER — Other Ambulatory Visit: Payer: Self-pay | Admitting: Endocrinology

## 2017-03-18 ENCOUNTER — Other Ambulatory Visit: Payer: Self-pay | Admitting: Family Medicine

## 2017-03-18 ENCOUNTER — Telehealth: Payer: Self-pay | Admitting: Family Medicine

## 2017-03-18 ENCOUNTER — Encounter: Payer: Self-pay | Admitting: Family Medicine

## 2017-03-18 DIAGNOSIS — R0982 Postnasal drip: Secondary | ICD-10-CM

## 2017-03-18 DIAGNOSIS — R197 Diarrhea, unspecified: Secondary | ICD-10-CM | POA: Diagnosis not present

## 2017-03-18 DIAGNOSIS — Z09 Encounter for follow-up examination after completed treatment for conditions other than malignant neoplasm: Secondary | ICD-10-CM | POA: Diagnosis not present

## 2017-03-18 DIAGNOSIS — F320 Major depressive disorder, single episode, mild: Secondary | ICD-10-CM

## 2017-03-18 NOTE — Telephone Encounter (Signed)
Called and [facility], sent her a Pharmacist, community message

## 2017-03-18 NOTE — Telephone Encounter (Signed)
Copied from [place]. Topic: Quick Communication - See Telephone Encounter >> Mar 18, 2017  2:17 PM Rosalin Hawking wrote: CRM for notification. See Telephone encounter for:  03/18/17.   Pt came in office stating was seen in our office for a ED fu appt and pt stated that provider wanted to know how she is doing today, pt states still not feeling well. Please advise.

## 2017-03-19 LAB — C. DIFFICILE GDH AND TOXIN A/B
GDH ANTIGEN: NOT DETECTED
MICRO NUMBER:: 90173020
SPECIMEN QUALITY:: ADEQUATE
TOXIN A AND B: NOT DETECTED

## 2017-03-20 ENCOUNTER — Encounter: Payer: Self-pay | Admitting: Family Medicine

## 2017-03-22 ENCOUNTER — Ambulatory Visit: Payer: Medicare Other | Attending: Neurology | Admitting: Physical Therapy

## 2017-03-22 ENCOUNTER — Telehealth (INDEPENDENT_AMBULATORY_CARE_PROVIDER_SITE_OTHER): Payer: Self-pay | Admitting: Orthopaedic Surgery

## 2017-03-22 NOTE — Telephone Encounter (Signed)
Patient called to say that she has upper respiratory infection and will call and reschedule her surgery and follow up when feeling better.   She is currently being monitored by her PCP Silvestre Mesi.   cb  [phone]

## 2017-03-22 NOTE — Telephone Encounter (Signed)
fyi

## 2017-03-23 NOTE — Telephone Encounter (Signed)
Thanks. Noted.

## 2017-03-25 ENCOUNTER — Ambulatory Visit: Payer: Medicare Other | Admitting: Physical Therapy

## 2017-03-29 ENCOUNTER — Encounter (INDEPENDENT_AMBULATORY_CARE_PROVIDER_SITE_OTHER): Payer: Self-pay | Admitting: Orthopaedic Surgery

## 2017-03-29 DIAGNOSIS — G5603 Carpal tunnel syndrome, bilateral upper limbs: Secondary | ICD-10-CM | POA: Insufficient documentation

## 2017-03-29 DIAGNOSIS — Z96652 Presence of left artificial knee joint: Secondary | ICD-10-CM | POA: Insufficient documentation

## 2017-03-29 DIAGNOSIS — M1712 Unilateral primary osteoarthritis, left knee: Secondary | ICD-10-CM | POA: Insufficient documentation

## 2017-03-29 DIAGNOSIS — M47812 Spondylosis without myelopathy or radiculopathy, cervical region: Secondary | ICD-10-CM | POA: Insufficient documentation

## 2017-03-30 ENCOUNTER — Inpatient Hospital Stay (HOSPITAL_COMMUNITY): Admission: RE | Admit: 2017-03-30 | Payer: Medicare Other | Source: Ambulatory Visit | Admitting: Orthopaedic Surgery

## 2017-03-30 ENCOUNTER — Encounter (HOSPITAL_COMMUNITY): Admission: RE | Payer: Self-pay | Source: Ambulatory Visit

## 2017-03-30 SURGERY — ARTHROPLASTY, KNEE, TOTAL
Anesthesia: General | Laterality: Left

## 2017-04-12 ENCOUNTER — Telehealth: Payer: Self-pay | Admitting: Family Medicine

## 2017-04-12 NOTE — Telephone Encounter (Signed)
Sherry [name] from [facility] called to report she made a home visit yesterday. Pt.'s urine dip showed 4+ glucose. She did an A1C - 6.7.

## 2017-04-12 NOTE — Telephone Encounter (Signed)
This is normal as she is on Jardiance.  A1c is fine

## 2017-04-12 NOTE — Telephone Encounter (Signed)
FYI

## 2017-04-13 ENCOUNTER — Inpatient Hospital Stay (INDEPENDENT_AMBULATORY_CARE_PROVIDER_SITE_OTHER): Payer: Medicare Other | Admitting: Orthopaedic Surgery

## 2017-05-11 ENCOUNTER — Other Ambulatory Visit: Payer: Self-pay | Admitting: Family Medicine

## 2017-05-12 MED FILL — metFORMIN HCL 500 MG TABS: 500 | 90 days supply | Qty: 360 | Fill #0

## 2017-06-07 MED FILL — LISINOPRIL 20 MG TABLET: 20 | 90 days supply | Qty: 90 | Fill #0

## 2017-06-07 MED FILL — FUROSEMIDE 20 MG TAB: 20 | 90 days supply | Qty: 90 | Fill #0

## 2017-06-07 MED FILL — GABAPENTIN 300 MG CAPSULE: 300 | 90 days supply | Qty: 360 | Fill #0

## 2017-06-07 MED FILL — TRADJENTA 5 MG TABLET: 5 | 90 days supply | Qty: 45 | Fill #0

## 2017-06-07 MED FILL — METOPROLOL TARTRATE 25 MG T: 25 | 90 days supply | Qty: 180 | Fill #0

## 2017-08-05 ENCOUNTER — Other Ambulatory Visit: Payer: Self-pay | Admitting: Family Medicine

## 2017-08-05 ENCOUNTER — Telehealth: Payer: Self-pay | Admitting: Family Medicine

## 2017-08-05 DIAGNOSIS — F320 Major depressive disorder, single episode, mild: Secondary | ICD-10-CM

## 2017-08-05 MED FILL — metFORMIN HCL 500 MG TABS: 500 | 90 days supply | Qty: 360 | Fill #1

## 2017-08-05 NOTE — Telephone Encounter (Signed)
Copied from [place] [id number]. Topic: Quick Communication - Rx Refill/Question >> Aug 05, 2017 12:33 PM Judyann Munson wrote: Medication: clopidogrel (PLAVIX) 75 MG tablet sertraline (ZOLOFT) 50 MG tablet  metFORMIN (GLUCOPHAGE) 500 MG tablet  QUEtiapine (SEROQUEL) 100 MG tablet  Has the patient contacted their pharmacy? no  Preferred Pharmacy (with phone number or street name): [place], [place] - [place] [phone] (Phone) [phone] (Fax)

## 2017-08-08 MED FILL — CLOPIDOGREL 75 MG TABLET: 75 | 90 days supply | Qty: 90 | Fill #0

## 2017-08-08 MED FILL — QUETIAPINE FUMARATE 100 MG: 100 | 90 days supply | Qty: 45 | Fill #0

## 2017-08-08 MED FILL — SERTRALINE HCL 50 MG TABLET: 50 | 90 days supply | Qty: 45 | Fill #0

## 2017-08-08 NOTE — Telephone Encounter (Signed)
Medications have been sent in. 

## 2017-08-10 NOTE — Progress Notes (Deleted)
[facility]  PATIENT: Sherry [name] DOB: 25-Jan-1951   REASON FOR VISIT: *** HISTORY FROM:    HISTORY OF PRESENT ILLNESS: Sherry [name] is a 67 y.o. female with history of HTN, DM, HLD, MVP admitted on 11/11/14 for complaints of facial droop, decreased sensation left face as well as stabbing sensation in her left shoulder and arm pit after an argument with husband. She appeared to be anxious on exam. 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 Stroke work up with TTE and CUS unremarkable. LDL 78 and A1C 7.4. She was considered possible TIA due to multiple risk factors but also not to exclude from anxiety as the etiology. She was discharged with ASA and lipitor.   01/22/15 follow up - the patient has been doing well. No recurrent stroke like symptoms. Had seen Dr. DEstanislado Pandyfor aneurysm and cerebral angio showed right M1 75% stenosis and right 7x419msaccular aneurysm at right ICA cavernous segment. Pt optioned conservative treatment towards aneurysm so far and plan for another CTA in 6 months. Her glucose at home from 51-139 and BP in good control. Today BP 134/68.    05/26/15 follow-up - she was admitted on 04/14/15 for left facial numbness and drooping. MRI no acute stroke, and MRA no LVO. CUS and TTE unremarkable. A1C 9.7 and LDL was -7. She admitted that she was under a lot of stress at that time including her 173o grandson made her crazy. She was continued on ASA and lipitor on discharge with diagnosis of possible TIA although anxiety more likely. Since then, she was doing fine. Still struggling with DM control. Today BP 131/69.  Interval History During the interval time, patient has been stable.  However, patient again referred here for multiple complaints.  Patient stated that she still has imbalance with a turning and intermittent falling.  Her A1c recently 6.4, better controlled than before, however, still has neuropathy in  her gabapentin was increased recently to 600 mg 3 times a day.  Complains of achy back pain and neck pain.  Complains of achy headache on the top of the head with occasionally shooting pain from the neck to the scalp.  Still has occasional episodic left facial numbness and left facial droop.  BP today 139/84.  She has been on both aspirin 325 and Plavix for a while.    REVIEW OF SYSTEMS: Full 14 system review of systems performed and notable only for those listed, all others are neg:  Constitutional: neg  Cardiovascular: neg Ear/Nose/Throat: neg  Skin: neg Eyes: neg Respiratory: neg Gastroitestinal: neg  Hematology/Lymphatic: neg  Endocrine: neg Musculoskeletal:neg Allergy/Immunology: neg Neurological: neg Psychiatric: neg Sleep : neg   ALLERGIES: Allergies  Allergen Reactions  . Influenza Vaccines Anaphylaxis  . Codeine Itching  . Demerol Other (See Comments)    SEIZURE  . Influenza Vac Split Quad   . Mango Flavor [Flavoring Agent] Itching  . Nsaids Other (See Comments)    NAPROXEN, IBUPROFEN, SKELAXIN---  CAUSES PALPITATIONS  . Prednisone     Caused her glucose to go up to 500 and went to ED  . Tramadol     Contraindicated with Victoza     HOME MEDICATIONS: Outpatient Medications Prior to Visit  Medication Sig Dispense Refill  . albuterol (PROAIR HFA) 108 (90 Base) MCG/ACT inhaler Inhale 1-2 puffs into the lungs every 6 (six) hours as needed for wheezing or shortness of breath. 18 g 6  . albuterol (PROVENTIL) (  2.5 MG/3ML) 0.083% nebulizer solution Take 3 mLs (2.5 mg total) by nebulization every 6 (six) hours as needed for wheezing or shortness of breath. 150 mL 1  . cetirizine (ZYRTEC) 10 MG tablet Take 1 tablet (10 mg total) by mouth daily. 90 tablet 3  . clopidogrel (PLAVIX) 75 MG tablet TAKE 1 TABLET BY MOUTH  DAILY 90 tablet 3  . clopidogrel (PLAVIX) 75 MG tablet TAKE 1 TABLET (75 MG TOTAL) BY MOUTH DAILY. 90 tablet 1  . docusate sodium (COLACE) 100 MG capsule  Take 1 capsule (100 mg total) by mouth daily. (Patient not taking: Reported on 03/17/2017) 10 capsule 2  . famotidine (PEPCID) 10 MG tablet Take 10 mg by mouth daily.    . fluticasone (FLOVENT HFA) 220 MCG/ACT inhaler Inhale 1 puff into the lungs 2 (two) times daily. May increase to 2 puffs if needed 1 Inhaler 12  . furosemide (LASIX) 20 MG tablet Take 1 tab daily prn swelling/weight gain. (Patient taking differently: Take 20 mg by mouth daily. Take 1 tab daily prn swelling/weight gain.) 90 tablet 3  . gabapentin (NEURONTIN) 300 MG capsule TAKE 2 CAPSULES BY MOUTH  TWO TIMES DAILY (Patient taking differently: TAKE 600 mg CAPSULES BY MOUTH  three TIMES DAILY) 360 capsule 1  . ipratropium (ATROVENT) 0.03 % nasal spray USE 2 SPRAYS NASALLY 4  TIMES DAILY 60 mL 0  . JARDIANCE 10 MG TABS tablet TAKE 1 TABLET BY MOUTH  DAILY (Patient taking differently: TAKE 10 mg TABLET BY MOUTH  DAILY) 90 tablet 1  . Lancets Misc. (ACCU-CHEK SOFTCLIX LANCET DEV) KIT USE AS DIRECTED 1 kit 0  . linagliptin (TRADJENTA) 5 MG TABS tablet Take 0.5 tablets (2.5 mg total) by mouth daily. 45 tablet 3  . lisinopril (PRINIVIL,ZESTRIL) 20 MG tablet Take 1 tablet (20 mg total) by mouth daily. 90 tablet 3  . metFORMIN (GLUCOPHAGE) 500 MG tablet Take 2 tablets (1,000 mg total) by mouth 2 (two) times daily. TAKE 2 TABLETS(1000 MG) BY MOUTH TWICE DAILY WITH A MEAL 360 tablet 3  . metFORMIN (GLUCOPHAGE) 500 MG tablet TAKE 2 TABLETS (1,000 MG TOTAL) BY MOUTH 2 (TWO) TIMES DAILY WITH A MEAL 360 tablet 3  . metoprolol tartrate (LOPRESSOR) 25 MG tablet Take 1 tablet (25 mg total) by mouth 2 (two) times daily. 180 tablet 3  . montelukast (SINGULAIR) 10 MG tablet Take 1 tablet (10 mg total) by mouth at bedtime. 30 tablet 3  . oseltamivir (TAMIFLU) 75 MG capsule Take 1 capsule (75 mg total) by mouth every 12 (twelve) hours. (Patient not taking: Reported on 03/17/2017) 10 capsule 0  . QUEtiapine (SEROQUEL) 100 MG tablet Take 0.5 tablets (50 mg total)  by mouth at bedtime. 45 tablet 3  . QUEtiapine (SEROQUEL) 100 MG tablet TAKE 1/2 TABLET (50 MG TOTAL) BY MOUTH AT BEDTIME. 45 tablet 1  . sertraline (ZOLOFT) 50 MG tablet TAKE ONE-HALF TABLET BY  MOUTH AT BEDTIME 45 tablet 3  . sertraline (ZOLOFT) 50 MG tablet TAKE 1/2 TABLET (25 MG TOTAL) BY MOUTH AT BEDTIME. 45 tablet 1   No facility-administered medications prior to visit.     PAST MEDICAL HISTORY: Past Medical History:  Diagnosis Date  . Cerebral aneurysm   . Complication of anesthesia HYPOTENSION INTRAOP W/ HYSTERECTOMY IN 1987--  DID OK W/ TOTAL KNEE IN 2008  . Diabetes mellitus   . DJD (degenerative joint disease)   . Dyslipidemia   . GERD (gastroesophageal reflux disease)   . H/O hiatal hernia   .  History of CHF (congestive heart failure) 2010-  RESOLVED  . History of peptic ulcer YRS AGO  . Hypertension   . Meniere's disease   . Mitral valve prolapse occasional palpitations w/ chest discomfort  . OA (osteoarthritis)   . Rotator cuff tear, left   . Stroke [facility])     PAST SURGICAL HISTORY: Past Surgical History:  Procedure Laterality Date  . BUNIONECTOMY  2007   LEFT FOOT  . LEFT SHOULDER SURGERY  1997   ROTATOR CUFF REPAIR  . SHOULDER ARTHROSCOPY  09/20/2011   Procedure: ARTHROSCOPY SHOULDER;  Surgeon: Johnn Hai, MD;  Location: [facility];  Service: Orthopedics;  Laterality: Left;  SAD AND MINI OPEN ROTATOR CUFF REPAIR    . TOTAL KNEE ARTHROPLASTY  09-12-2006  [facility]   RIGHT KNEE  . TUBAL LIGATION  1976  . TYMPANOPLASTY  1990;   1980  . VAGINAL HYSTERECTOMY  1987    FAMILY HISTORY: Family History  Problem Relation Age of Onset  . Heart disease Mother        in her 72s, heart attack  . Diabetes Mother   . Kidney disease Mother        dialysis  . Thyroid disease Mother   . Aneurysm Mother   . Ovarian cancer Mother   . Breast cancer Mother   . Thyroid disease Sister   . Heart disease Sister   . Lymphoma Father   . Diabetes Father     . Hypertension Father   . Congestive Heart Failure Father   . Thyroid disease Brother   . Thyroid disease Paternal Grandmother   . Heart attack Sister        at age 67  . Stroke Sister   . Hypertension Sister   . Breast cancer Maternal Aunt   . Breast cancer Maternal Grandmother   . Breast cancer Maternal Aunt   . Breast cancer Maternal Aunt   . Other Brother        Musician accident  . Bipolar disorder Son   . Other Son        GSW    SOCIAL HISTORY: Social History   Socioeconomic History  . Marital status: Widowed    Spouse name: Not on file  . Number of children: 4  . Years of education: BA  . Highest education level: Not on file  Occupational History  . Occupation: LPN - retired  Scientific laboratory technician  . Financial resource strain: Not on file  . Food insecurity:    Worry: Not on file    Inability: Not on file  . Transportation needs:    Medical: Not on file    Non-medical: Not on file  Tobacco Use  . Smoking status: Never Smoker  . Smokeless tobacco: Never Used  Substance and Sexual Activity  . Alcohol use: No  . Drug use: No  . Sexual activity: Not on file  Lifestyle  . Physical activity:    Days per week: Not on file    Minutes per session: Not on file  . Stress: Not on file  Relationships  . Social connections:    Talks on phone: Not on file    Gets together: Not on file    Attends religious service: Not on file    Active member of club or organization: Not on file    Attends meetings of clubs or organizations: Not on file    Relationship status: Not on file  . Intimate partner violence:  Fear of current or ex partner: Not on file    Emotionally abused: Not on file    Physically abused: Not on file    Forced sexual activity: Not on file  Other Topics Concern  . Not on file  Social History Narrative  . Not on file     PHYSICAL EXAM  There were no vitals filed for this visit. There is no height or weight on file to calculate BMI.  Generalized: Well  developed, in no acute distress  Head: normocephalic and atraumatic,. Oropharynx benign  Neck: Supple, no carotid bruits  Cardiac: Regular rate rhythm, no murmur  Musculoskeletal: No deformity   Neurological examination   Mentation: Alert oriented to time, place, history taking. Attention span and concentration appropriate. Recent and remote memory intact.  Follows all commands speech and language fluent.   Cranial nerve II-XII: Fundoscopic exam reveals sharp disc margins.Pupils were equal round reactive to light extraocular movements were full, visual field were full on confrontational test. Facial sensation and strength were normal. hearing was intact to finger rubbing bilaterally. Uvula tongue midline. head turning and shoulder shrug were normal and symmetric.Tongue protrusion into cheek strength was normal. Motor: normal bulk and tone, full strength in the BUE, BLE, fine finger movements normal, no pronator drift. No focal weakness Sensory: normal and symmetric to light touch, pinprick, and  Vibration, proprioception  Coordination: finger-nose-finger, heel-to-shin bilaterally, no dysmetria Reflexes: Brachioradialis 2/2, biceps 2/2, triceps 2/2, patellar 2/2, Achilles 2/2, plantar responses were flexor bilaterally. Gait and Station: Rising up from seated position without assistance, normal stance,  moderate stride, good arm swing, smooth turning, able to perform tiptoe, and heel walking without difficulty. Tandem gait is steady  DIAGNOSTIC DATA (LABS, IMAGING, TESTING) - I reviewed patient records, labs, notes, testing and imaging myself where available.  Lab Results  Component Value Date   WBC 9.8 03/09/2017   HGB 14.9 03/09/2017   HCT 46.9 (H) 03/09/2017   MCV 89.7 03/09/2017   PLT 234 03/09/2017      Component Value Date/Time   NA 140 03/09/2017 1811   K 4.6 03/09/2017 1811   CL 104 03/09/2017 1811   CO2 22 03/09/2017 1811   GLUCOSE 123 (H) 03/09/2017 1811   BUN 9 03/09/2017  1811   CREATININE 0.66 03/09/2017 1811   CREATININE 0.86 02/09/2014 1756   CALCIUM 8.6 (L) 03/09/2017 1811   PROT 5.9 (L) 08/20/2016 0436   ALBUMIN 3.4 (L) 08/20/2016 0436   AST 16 08/20/2016 0436   ALT 11 (L) 08/20/2016 0436   ALKPHOS 61 08/20/2016 0436   BILITOT 0.4 08/20/2016 0436   GFRNONAA >60 03/09/2017 1811   GFRAA >60 03/09/2017 1811   Lab Results  Component Value Date   CHOL 114 08/20/2016   HDL 51 08/20/2016   LDLCALC 51 08/20/2016   TRIG 58 08/20/2016   CHOLHDL 2.2 08/20/2016   Lab Results  Component Value Date   HGBA1C 6.3 12/06/2016   No results found for: [id number] Lab Results  Component Value Date   TSH 0.68 05/19/2016      ASSESSMENT AND PLAN   67 y.o. African American female with PMH of HTN, DM, HLD, MVP admitted on 11/11/14 for complaints of facial droop, decreased sensation left face as well as stabbing sensation in her left shoulder and arm pit after an argument with husband. MRI showed no acute infarct and MRA showed 27m right cavernous ICA aneurysm. Stroke work up with TTE and CUS unremarkable. LDL 78 and A1C 7.4.  She was considered possible TIA due to multiple risk factors but also not to exclude from anxiety as the etiology. She was discharged with ASA and lipitor. During the interval time, she saw Dr. Estanislado Pandy for aneurysm and cerebral angio showed right M1 75% stenosis and right 7x12m saccular aneurysm at right ICA cavernous segment. Pt optioned conservative treatment towards aneurysm so far. 04/2015 was admitted for possible right brain TIA vs. Anxiety. A1C 9.7 and LDL too low at -7.     Of the time, patient has multiple complaints with imbalance, occasionally falling back pain, neck pain, headache with occipital neuralgia, occasionally episodic left facial numbness and drooping.  Symptoms consistent with occipital neuralgia, cervical radiculopathy, stress/anxiety related symptoms.  Will do EMG  and refer to PT.  However, patient has been on dual  antiplatelet for more than a year, will change to monotherapy.  Repeat MRA to monitor aneurysm.  Plan:  - continue plavix for stroke prevention, but stop ASA for now.  - will repeat MRA to evaluate aneurysm - EMG/NCS to evaluate neck pain - PT for balance training and neck pain and back pain - Follow up with your primary care physician for stroke risk factor modification. Recommend maintain blood pressure goal 130/80, diabetes with hemoglobin A1c goal below 6.5% and lipids with LDL cholesterol goal below 70 mg/dL.  - check BP and glucose and record  - relaxation and cope with stress - follow up with eye doctor regularly  - healthy diet and regular exercise - follow up in 6 months.    NDennie Bible GPopl[facility] BP[facility] APRN  [facility] [address] [place] [phone]

## 2017-08-15 ENCOUNTER — Telehealth: Payer: Self-pay | Admitting: *Deleted

## 2017-08-15 ENCOUNTER — Encounter: Payer: Self-pay | Admitting: Nurse Practitioner

## 2017-08-15 ENCOUNTER — Ambulatory Visit: Payer: Medicare Other | Admitting: Nurse Practitioner

## 2017-08-15 NOTE — Telephone Encounter (Signed)
Patient was no show for follow up with NP today.  

## 2017-09-07 MED FILL — GABAPENTIN 300 MG CAPSULE: 300 | 90 days supply | Qty: 360 | Fill #1

## 2017-09-16 ENCOUNTER — Other Ambulatory Visit: Payer: Self-pay | Admitting: Endocrinology

## 2017-09-16 ENCOUNTER — Other Ambulatory Visit: Payer: Self-pay | Admitting: Family Medicine

## 2017-09-16 DIAGNOSIS — E118 Type 2 diabetes mellitus with unspecified complications: Secondary | ICD-10-CM

## 2017-09-16 DIAGNOSIS — I10 Essential (primary) hypertension: Secondary | ICD-10-CM

## 2017-09-16 MED ORDER — EMPAGLIFLOZIN 10 MG PO TABS
ORAL_TABLET | ORAL | 0 refills | Status: DC
Start: 2017-09-16 — End: 2017-11-09

## 2017-09-16 MED FILL — LISINOPRIL 20 MG TABLET: 20 | 90 days supply | Qty: 90 | Fill #0

## 2017-09-16 MED FILL — TRADJENTA 5 MG TABLET: 5 | 90 days supply | Qty: 45 | Fill #0

## 2017-09-16 MED FILL — JARDIANCE 10 MG TABLET: 10 | 90 days supply | Qty: 90 | Fill #0

## 2017-09-16 MED FILL — METOPROLOL TARTRATE 25 MG T: 25 | 90 days supply | Qty: 180 | Fill #0

## 2017-09-19 ENCOUNTER — Other Ambulatory Visit: Payer: Self-pay | Admitting: Family Medicine

## 2017-09-19 DIAGNOSIS — I10 Essential (primary) hypertension: Secondary | ICD-10-CM

## 2017-09-19 MED FILL — FUROSEMIDE 20 MG TAB: 20 | 30 days supply | Qty: 30 | Fill #0

## 2017-10-14 MED FILL — FUROSEMIDE 20 MG TAB: 20 | 30 days supply | Qty: 30 | Fill #1

## 2017-10-31 MED FILL — metFORMIN HCL 500 MG TABS: 500 | 90 days supply | Qty: 360 | Fill #2

## 2017-10-31 MED FILL — CLOPIDOGREL 75 MG TABLET: 75 | 90 days supply | Qty: 90 | Fill #1

## 2017-11-09 ENCOUNTER — Telehealth: Payer: Self-pay

## 2017-11-09 ENCOUNTER — Other Ambulatory Visit: Payer: Self-pay | Admitting: Family Medicine

## 2017-11-09 ENCOUNTER — Encounter: Payer: Self-pay | Admitting: Family Medicine

## 2017-11-09 ENCOUNTER — Ambulatory Visit (INDEPENDENT_AMBULATORY_CARE_PROVIDER_SITE_OTHER): Payer: Medicare Other | Admitting: Surgery

## 2017-11-09 ENCOUNTER — Encounter (INDEPENDENT_AMBULATORY_CARE_PROVIDER_SITE_OTHER): Payer: Self-pay | Admitting: Surgery

## 2017-11-09 DIAGNOSIS — R0982 Postnasal drip: Secondary | ICD-10-CM

## 2017-11-09 DIAGNOSIS — M1712 Unilateral primary osteoarthritis, left knee: Secondary | ICD-10-CM | POA: Diagnosis not present

## 2017-11-09 DIAGNOSIS — E118 Type 2 diabetes mellitus with unspecified complications: Secondary | ICD-10-CM

## 2017-11-09 DIAGNOSIS — I10 Essential (primary) hypertension: Secondary | ICD-10-CM

## 2017-11-09 NOTE — Telephone Encounter (Signed)
Primary Cardiologist: [facility] Pre-operative Risk Assessment    Request for surgical clearance:  1. What type of surgery is being performed? Left total knee arthroplasty   2. When is this surgery scheduled? TBD   3. What type of clearance is required (medical clearance vs. Pharmacy clearance to hold med vs. Both)? Medical  4. Are there any medications that need to be held prior to surgery and how long?Not listed   5. Practice name and name of physician performing surgery? [facility] ( Dr. Rodell Perna)   6. What is your office phone number [phone]    7.   What is your office fax number [phone]  8.   Anesthesia type (None, local, MAC, general) ? Not listed    Sherry [name] 11/09/2017, 3:25 PM  _________________________________________________________________   (provider comments below)

## 2017-11-09 NOTE — Progress Notes (Signed)
Office Visit Note   Patient: Sherry [name]           Date of Birth: [birth year]           MRN: [id number] Visit Date: 11/09/2017              Requested by: Darreld Mclean, MD [address] [place], [place] [address] PCP: Darreld Mclean, MD   Assessment & Plan: Visit Diagnoses:  1. Unilateral primary osteoarthritis, left knee     Plan: Patient understands that only viable option at this point would be left total knee replacement.  She was previously scheduled for this February 2019 but had to postpone surgery due to an illness.  States that she is ready to have this done sometime in the near future.  We will need preop medical and cardiac clearances.  Preop sheet filled out.  Follow-Up Instructions: Return for Please return office visit for preoperative H&P after scheduling surgery with Debbie.   Orders:  No orders of the defined types were placed in this encounter.  No orders of the defined types were placed in this encounter.     Procedures: No procedures performed   Clinical Data: No additional findings.   Subjective: Chief Complaint  Patient presents with  . Left Knee - Pain    HPI 67 year old white female history of end-stage DJD left knee and chronic pain returns.  Patient was originally scheduled for left total knee replacement February 2019 but had to cancel surgery due to an illness.  She has continued to have ongoing pain that is had a significant negative impact on her quality of life.  She is ready to proceed with scheduling left total knee replacement.  Pain with all activity.  Feels like the knee gives away at times.  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 Review of Systems No current cardiac pulmonary GI GU issues  Objective: Vital Signs: There were no vitals taken for this visit.  Physical Exam  Constitutional: She is oriented to person,  place, and time. No distress.  HENT:  Head: Normocephalic and atraumatic.  Eyes: Pupils are equal, round, and reactive to light. EOM are normal.  Pulmonary/Chest: No respiratory distress.  Musculoskeletal:  Gait antalgic.  Negative logroll bilateral hips.  Left knee she does have some swelling with may be 1+ effusion.  Positive crepitus.  Joint line tender.  Ligament stable.  Calf nontender.  Neurological: She is alert and oriented to person, place, and time.  Skin: Skin is warm and dry.    Ortho Exam  Specialty Comments:  No specialty comments available.  Imaging: No results found.   PMFS History: Patient Active Problem List   Diagnosis Date Noted  . S/P total knee replacement, right 03/29/2017  . Spondylosis without myelopathy or radiculopathy, cervical region 03/29/2017  . Unilateral primary osteoarthritis, left knee 03/29/2017  . Bilateral carpal tunnel syndrome 03/29/2017  . Acute asthma exacerbation 08/19/2016  . Preoperative cardiovascular examination 08/16/2016  . Cervicalgia 03/23/2016  . Acute back pain with sciatica, right 01/14/2016  . Vertigo 04/14/2015  . Anxiety state 01/22/2015  . HLD (hyperlipidemia) 01/22/2015  . Type 2 diabetes mellitus with circulatory disorder ([place]) 01/22/2015  . Intracranial vascular stenosis 01/22/2015  . Aneurysm ([place])   . Headache   . Diabetes mellitus with complication ([place]) 24/10/7351  . Essential hypertension 11/12/2014  . Dependent edema 11/12/2014  .  Peripheral neuropathy 11/12/2014  . Depression 11/12/2014  . Facial droop   . Chest pain   . TIA (transient ischemic attack) 11/11/2014  . Dyspnea 08/06/2014  . Hyperthyroidism 08/06/2014  . Meniere's disease 06/07/2014  . History of CHF (congestive heart failure) 06/07/2014  . Essential hypertension, benign 06/13/2013  . Goiter 06/13/2013  . Obesity, unspecified 06/13/2013   Past Medical History:  Diagnosis Date  . Cerebral aneurysm   . Complication of anesthesia  HYPOTENSION INTRAOP W/ HYSTERECTOMY IN 1987--  DID OK W/ TOTAL KNEE IN 2008  . Diabetes mellitus   . DJD (degenerative joint disease)   . Dyslipidemia   . GERD (gastroesophageal reflux disease)   . H/O hiatal hernia   . History of CHF (congestive heart failure) 2010-  RESOLVED  . History of peptic ulcer YRS AGO  . Hypertension   . Meniere's disease   . Mitral valve prolapse occasional palpitations w/ chest discomfort  . OA (osteoarthritis)   . Rotator cuff tear, left   . Stroke [facility])     Family History  Problem Relation Age of Onset  . Heart disease Mother        in her 72s, heart attack  . Diabetes Mother   . Kidney disease Mother        dialysis  . Thyroid disease Mother   . Aneurysm Mother   . Ovarian cancer Mother   . Breast cancer Mother   . Thyroid disease Sister   . Heart disease Sister   . Lymphoma Father   . Diabetes Father   . Hypertension Father   . Congestive Heart Failure Father   . Thyroid disease Brother   . Thyroid disease Paternal Grandmother   . Heart attack Sister        at age 3  . Stroke Sister   . Hypertension Sister   . Breast cancer Maternal Aunt   . Breast cancer Maternal Grandmother   . Breast cancer Maternal Aunt   . Breast cancer Maternal Aunt   . Other Brother        Musician accident  . Bipolar disorder Son   . Other Son        GSW    Past Surgical History:  Procedure Laterality Date  . BUNIONECTOMY  2007   LEFT FOOT  . LEFT SHOULDER SURGERY  1997   ROTATOR CUFF REPAIR  . SHOULDER ARTHROSCOPY  09/20/2011   Procedure: ARTHROSCOPY SHOULDER;  Surgeon: Johnn Hai, MD;  Location: [facility];  Service: Orthopedics;  Laterality: Left;  SAD AND MINI OPEN ROTATOR CUFF REPAIR    . TOTAL KNEE ARTHROPLASTY  09-12-2006  [facility]   RIGHT KNEE  . TUBAL LIGATION  1976  . TYMPANOPLASTY  1990;   1980  . VAGINAL HYSTERECTOMY  1987   Social History   Occupational History  . Occupation: LPN - retired  Tobacco Use  . Smoking  status: Never Smoker  . Smokeless tobacco: Never Used  Substance and Sexual Activity  . Alcohol use: No  . Drug use: No  . Sexual activity: Not on file

## 2017-11-09 NOTE — Telephone Encounter (Signed)
I have not received form yet as of today 11/09/17

## 2017-11-10 MED ORDER — EMPAGLIFLOZIN 10 MG PO TABS
ORAL_TABLET | ORAL | 0 refills | Status: DC
Start: 2017-11-10 — End: 2018-03-21

## 2017-11-10 MED ORDER — METOPROLOL TARTRATE 25 MG PO TABS: 25.0000 mg | ORAL_TABLET | Freq: Two times a day (BID) | ORAL | 0 refills | Status: DC

## 2017-11-10 MED ORDER — LINAGLIPTIN 5 MG PO TABS: 2.5000 mg | ORAL_TABLET | Freq: Every day | ORAL | 0 refills | Status: DC

## 2017-11-10 MED ORDER — LISINOPRIL 20 MG PO TABS: 20.0000 mg | ORAL_TABLET | Freq: Every day | ORAL | 0 refills | Status: DC

## 2017-11-10 MED ORDER — IPRATROPIUM BROMIDE 0.03 % NA SOLN: 2.0000 | Freq: Four times a day (QID) | NASAL | 0 refills | Status: DC

## 2017-11-10 NOTE — Telephone Encounter (Signed)
   Primary Cardiologist:Kenneth C Hilty, MD  Chart reviewed as part of pre-operative protocol coverage. Because of 35 M Bertelson's past medical history and time since last visit, he/she will require a follow-up visit in order to better assess preoperative cardiovascular risk.  Pre-op covering staff: - Please schedule appointment and call patient to inform them. - Please contact requesting surgeon's office via preferred method (i.e, phone, fax) to inform them of need for appointment prior to surgery.  Lyda Jester, PA-C  11/10/2017, 1:42 PM

## 2017-11-10 NOTE — Telephone Encounter (Signed)
LMOVM TO CONTACT CLINIC BACK TO SET UP SURGICAL CLEARANCE APPT   CONTACTED  SURGERY OFFICE [phone]   ALSO TO NOTIFY PT NEEDS CLEARANCE APPT BEFORE PROCEDURE

## 2017-11-11 ENCOUNTER — Telehealth: Payer: Self-pay | Admitting: *Deleted

## 2017-11-11 NOTE — Telephone Encounter (Signed)
Left message on VM to call back to schedule appt.

## 2017-11-11 NOTE — Telephone Encounter (Signed)
Received Medical/Surgical Clearance Form from [place]; forwarded to provider/SLS 10/04

## 2017-11-12 NOTE — Progress Notes (Addendum)
[facility] [employer] [facility] [place], [place], [place] [address] [phone]  Date:  11/14/2017   Name:  Sherry [name]   DOB:  [birth year]   MRN:  [id number]  PCP:  Darreld Mclean, MD    Chief Complaint: Surgical Clearance (knee replacement, left knee, oct 23rd)   History of Present Illness:  Sherry [name] is a 67 y.o. very pleasant female patient who presents with the following:  Here today seeking surgical clearance for a left total knee- this is scheduled for later on this month per Dr. Lorin Mercy History of right knee replacement already per Dr. Alvan Dame Her left knee is really bad- she can barely get around and is eager to get her surgery History of TIA, well controled DM-  Hyperlipidemia, HTN, and history of CHF  Lab Results  Component Value Date   HGBA1C 6.3 12/06/2016    Last echo 3/17 normal  Negative stress July of 2018:  The left ventricular ejection fraction is hyperdynamic (>65%).  Nuclear stress EF: 68%.  There was no ST segment deviation noted during stress.  The study is normal.  This is a low risk study. 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 He is on a  vent.  She has a 19 yo grandson who she has raised, he lives with her still and is something of a trial.   No issues with her breathing She is seeing Dr. Debara Pickett as well this week for pre-op clearance fr om a cardiac standpoint as well  No CP or SOB  She is allergic to flu shot   Albuterol plavix jardiance 10 flovent Lasix neurontin tradjenta 2.5 Lisinopril Metformin Lopressor singulair seroquel   Patient Active Problem List   Diagnosis Date Noted  . S/P total knee replacement, right 03/29/2017  . Spondylosis without myelopathy or radiculopathy, cervical region 03/29/2017  . Unilateral primary osteoarthritis, left knee 03/29/2017  . Bilateral carpal tunnel syndrome 03/29/2017  . Acute asthma exacerbation 08/19/2016  . Preoperative cardiovascular examination 08/16/2016  . Cervicalgia 03/23/2016  . Acute back pain with sciatica, right 01/14/2016  . Vertigo 04/14/2015  . Anxiety state 01/22/2015  . HLD (hyperlipidemia) 01/22/2015  . Type 2 diabetes mellitus with circulatory disorder ([place]) 01/22/2015  . Intracranial vascular stenosis 01/22/2015  . Aneurysm ([place])   . Headache   . Diabetes mellitus with complication ([place]) 79/15/0569  . Essential hypertension 11/12/2014  . Dependent edema 11/12/2014  . Peripheral neuropathy 11/12/2014  . Depression 11/12/2014  . Facial droop   . Chest pain   . TIA (transient ischemic attack) 11/11/2014  . Dyspnea 08/06/2014  .  Hyperthyroidism 08/06/2014  . Meniere's disease 06/07/2014  . History of CHF (congestive heart failure) 06/07/2014  . Essential hypertension, benign 06/13/2013  . Goiter 06/13/2013  . Obesity, unspecified 06/13/2013    Past Medical History:  Diagnosis Date  . Cerebral aneurysm   . Complication of anesthesia HYPOTENSION INTRAOP W/ HYSTERECTOMY IN 1987--  DID OK W/ TOTAL KNEE IN 2008  . Diabetes mellitus   . DJD (degenerative joint disease)   . Dyslipidemia   . GERD (gastroesophageal reflux disease)   . H/O  hiatal hernia   . History of CHF (congestive heart failure) 2010-  RESOLVED  . History of peptic ulcer YRS AGO  . Hypertension   . Meniere's disease   . Mitral valve prolapse occasional palpitations w/ chest discomfort  . OA (osteoarthritis)   . Rotator cuff tear, left   . Stroke [facility])     Past Surgical History:  Procedure Laterality Date  . BUNIONECTOMY  2007   LEFT FOOT  . LEFT SHOULDER SURGERY  1997   ROTATOR CUFF REPAIR  . SHOULDER ARTHROSCOPY  09/20/2011   Procedure: ARTHROSCOPY SHOULDER;  Surgeon: Johnn Hai, MD;  Location[facility];  Service: Orthopedics;  Laterality: Left;  SAD AND MINI OPEN ROTATOR CUFF REPAIR    . TOTAL KNEE ARTHROPLASTY  09-12-2006  [facility]   RIGHT KNEE  . TUBAL LIGATION  1976  . TYMPANOPLASTY  1990;   1980  . VAGINAL HYSTERECTOMY  1987    Social History   Tobacco Use  . Smoking status: Never Smoker  . Smokeless tobacco: Never Used  Substance Use Topics  . Alcohol use: No  . Drug use: No    Family History  Problem Relation Age of Onset  . Heart disease Mother        in her 53s, heart attack  . Diabetes Mother   . Kidney disease Mother        dialysis  . Thyroid disease Mother   . Aneurysm Mother   . Ovarian cancer Mother   . Breast cancer Mother   . Thyroid disease Sister   . Heart disease Sister   . Lymphoma Father   . Diabetes Father   . Hypertension Father   . Congestive Heart Failure Father   . Thyroid disease Brother   . Thyroid disease Paternal Grandmother   . Heart attack Sister        at age 72  . Stroke Sister   . Hypertension Sister   . Breast cancer Maternal Aunt   . Breast cancer Maternal Grandmother   . Breast cancer Maternal Aunt   . Breast cancer Maternal Aunt   . Other Brother        Musician accident  . Bipolar disorder Son   . Other Son        GSW    Allergies  Allergen Reactions  . Influenza Vaccines Anaphylaxis  . Codeine Itching  . Demerol Other (See Comments)    SEIZURE  .  Mango Flavor [Flavoring Agent] Itching  . Nsaids Other (See Comments)    NAPROXEN, IBUPROFEN, SKELAXIN---  CAUSES PALPITATIONS  . Prednisone Other (See Comments)    Caused her glucose to go up to 500 and went to ED  . Tramadol Other (See Comments)    Contraindicated with Victoza     Medication list has been reviewed and updated.  Current Outpatient Medications on File Prior to Visit  Medication Sig Dispense Refill  . albuterol (PROAIR HFA) 108 (  90 Base) MCG/ACT inhaler Inhale 1-2 puffs into the lungs every 6 (six) hours as needed for wheezing or shortness of breath. 18 g 6  . albuterol (PROVENTIL) (2.5 MG/3ML) 0.083% nebulizer solution Take 3 mLs (2.5 mg total) by nebulization every 6 (six) hours as needed for wheezing or shortness of breath. 150 mL 1  . Bioflavonoid Products (VITAMIN C) CHEW Chew 2 each by mouth daily.    . cetirizine (ZYRTEC) 10 MG tablet Take 1 tablet (10 mg total) by mouth daily. 90 tablet 3  . clopidogrel (PLAVIX) 75 MG tablet TAKE 1 TABLET BY MOUTH  DAILY (Patient taking differently: Take 75 mg by mouth daily. ) 90 tablet 3  . docusate sodium (COLACE) 100 MG capsule Take 1 capsule (100 mg total) by mouth daily. 10 capsule 2  . empagliflozin (JARDIANCE) 10 MG TABS tablet TAKE 10 mg TABLET BY MOUTH  DAILY (Patient taking differently: Take 5 mg by mouth daily. ) 90 tablet 0  . famotidine (PEPCID) 10 MG tablet Take 10 mg by mouth daily.    . fluticasone (FLOVENT HFA) 220 MCG/ACT inhaler Inhale 1 puff into the lungs 2 (two) times daily. May increase to 2 puffs if needed (Patient taking differently: Inhale 2 puffs into the lungs 2 (two) times daily as needed (for shorness of breath or wheezing). ) 1 Inhaler 12  . furosemide (LASIX) 20 MG tablet Take 1 tablet (20 mg total) by mouth daily. 30 tablet 5  . gabapentin (NEURONTIN) 300 MG capsule TAKE 2 CAPSULES BY MOUTH  TWO TIMES DAILY (Patient taking differently: Take 600 mg by mouth 2 (two) times daily. ) 360 capsule 1  .  ipratropium (ATROVENT) 0.03 % nasal spray Place 2 sprays into the nose 4 (four) times daily. (Patient taking differently: Place 2 sprays into the nose 2 (two) times daily as needed for rhinitis. ) 60 mL 0  . Lancets Misc. (ACCU-CHEK SOFTCLIX LANCET DEV) KIT USE AS DIRECTED 1 kit 0  . linagliptin (TRADJENTA) 5 MG TABS tablet Take 0.5 tablets (2.5 mg total) by mouth daily. 45 tablet 0  . lisinopril (PRINIVIL,ZESTRIL) 20 MG tablet Take 1 tablet (20 mg total) by mouth daily. 90 tablet 0  . metFORMIN (GLUCOPHAGE) 500 MG tablet Take 2 tablets (1,000 mg total) by mouth 2 (two) times daily. TAKE 2 TABLETS(1000 MG) BY MOUTH TWICE DAILY WITH A MEAL (Patient taking differently: Take 1,000 mg by mouth 2 (two) times daily. ) 360 tablet 3  . metoprolol tartrate (LOPRESSOR) 25 MG tablet Take 1 tablet (25 mg total) by mouth 2 (two) times daily. 180 tablet 0  . montelukast (SINGULAIR) 10 MG tablet Take 1 tablet (10 mg total) by mouth at bedtime. 30 tablet 3  . Multiple Vitamins-Minerals (MULTIVITAMIN PO) Take 2 tablets by mouth daily.    . QUEtiapine (SEROQUEL) 100 MG tablet Take 0.5 tablets (50 mg total) by mouth at bedtime. 45 tablet 3  . sertraline (ZOLOFT) 50 MG tablet TAKE ONE-HALF TABLET BY  MOUTH AT BEDTIME (Patient taking differently: Take 25 mg by mouth at bedtime. ) 45 tablet 3   No current facility-administered medications on file prior to visit.     Review of Systems:  As per HPI- otherwise negative.   Physical Examination: Vitals:   11/14/17 1453  BP: 118/78  Pulse: 63  Resp: 16  Temp: 98.2 F (36.8 C)  SpO2: 99%   Vitals:   11/14/17 1453  Weight: 180 lb (81.6 kg)  Height: _0  (1.549 m)  Body mass index is 34.01 kg/m. Ideal Body Weight: Weight in (lb) to have BMI = 25: 132  GEN: WDWN, NAD, Non-toxic, A & O x 3, looks well, obese  HEENT: Atraumatic, Normocephalic. Neck supple. No masses, No LAD.   Ears and Nose: No external deformity. CV: RRR, No M/G/R. No JVD. No thrill. No  extra heart sounds. PULM: CTA B, no wheezes, crackles, rhonchi. No retractions. No resp. distress. No accessory muscle use. EXTR: No c/c/e NEURO Normal gait.  PSYCH: Normally interactive. Conversant. Not depressed or anxious appearing.  Calm demeanor.    Assessment and Plan: Essential hypertension - Plan: Comprehensive metabolic panel  Type 2 diabetes mellitus with complication, without long-term current use of insulin (HCC) - Plan: Comprehensive metabolic panel, Hemoglobin A1c  Pre-operative clearance - Plan: CBC  Following up today for pre-op clearance assuming her labs are ok and neurology says no issue we are ok to go ahead She is also seeing cardiology later this week for eval   Signed [name], MD  Got response from Dr. Erlinda Hong:  Hi, Dr. Lorelei Pont:   Yes, the aneurysm was evaluated with MRA in 02/2017 and it was stable. Pt prefer medical management and against procedure. Dr. Estanislado Pandy from interventional radiology also recommended medical management at this time. Really not much to do. I would just recommend to keep BP stable during the perioperative period to avoid drastic fluctuation. Thanks.   Rosalin Hawking, MD PhD   10/8 received her labs Message to pt [facility] to clear for surgery, will complete and fax form   Results for orders placed or performed in visit on 11/14/17  CBC  Result Value Ref Range   WBC 6.4 4.0 - 10.5 K/uL   RBC 4.61 3.87 - 5.11 Mil/uL   Platelets 253.0 150.0 - 400.0 K/uL   Hemoglobin 13.4 12.0 - 15.0 g/dL   HCT 41.4 36.0 - 46.0 %   MCV 89.9 78.0 - 100.0 fl   MCHC 32.2 30.0 - 36.0 g/dL   RDW 14.2 11.5 - 15.5 %  Comprehensive metabolic panel  Result Value Ref Range   Sodium 140 135 - 145 mEq/L   Potassium 4.0 3.5 - 5.1 mEq/L   Chloride 103 96 - 112 mEq/L   CO2 29 19 - 32 mEq/L   Glucose, Bld 105 (H) 70 - 99 mg/dL   BUN 16 6 - 23 mg/dL   Creatinine, Ser 0.73 0.40 - 1.20 mg/dL   Total Bilirubin 0.6 0.2 - 1.2 mg/dL   Alkaline Phosphatase 58 39 - 117  U/L   AST 12 0 - 37 U/L   ALT 10 0 - 35 U/L   Total Protein 6.3 6.0 - 8.3 g/dL   Albumin 4.0 3.5 - 5.2 g/dL   Calcium 9.2 8.4 - 10.5 mg/dL   GFR 102.31 >60.00 mL/min  Hemoglobin A1c  Result Value Ref Range   Hgb A1c MFr Bld 6.7 (H) 4.6 - 6.5 %

## 2017-11-14 ENCOUNTER — Encounter: Payer: Self-pay | Admitting: Family Medicine

## 2017-11-14 ENCOUNTER — Ambulatory Visit (INDEPENDENT_AMBULATORY_CARE_PROVIDER_SITE_OTHER): Payer: Medicare Other | Admitting: Family Medicine

## 2017-11-14 VITALS — BP 118/78 | HR 63 | Temp 98.2°F | Resp 16 | Ht 61.0 in | Wt 180.0 lb

## 2017-11-14 DIAGNOSIS — E118 Type 2 diabetes mellitus with unspecified complications: Secondary | ICD-10-CM | POA: Diagnosis not present

## 2017-11-14 DIAGNOSIS — I10 Essential (primary) hypertension: Secondary | ICD-10-CM

## 2017-11-14 DIAGNOSIS — Z01818 Encounter for other preprocedural examination: Secondary | ICD-10-CM

## 2017-11-14 NOTE — Patient Instructions (Signed)
Please have your labs drawn today- assuming ok we are all set for your operation from my standpoint. I will be in touch with your results

## 2017-11-14 NOTE — Telephone Encounter (Signed)
appt scheduled with Dr. Debara Pickett 11/16/17.

## 2017-11-15 ENCOUNTER — Encounter: Payer: Self-pay | Admitting: Family Medicine

## 2017-11-15 LAB — COMPREHENSIVE METABOLIC PANEL
ALT: 10 U/L (ref 0–35)
AST: 12 U/L (ref 0–37)
Albumin: 4 g/dL (ref 3.5–5.2)
Alkaline Phosphatase: 58 U/L (ref 39–117)
BUN: 16 mg/dL (ref 6–23)
CO2: 29 mEq/L (ref 19–32)
Calcium: 9.2 mg/dL (ref 8.4–10.5)
Chloride: 103 mEq/L (ref 96–112)
Creatinine, Ser: 0.73 mg/dL (ref 0.40–1.20)
GFR: 102.31 mL/min (ref >60.00)
Glucose, Bld: 105 mg/dL — ABNORMAL HIGH (ref 70–99)
Potassium: 4 mEq/L (ref 3.5–5.1)
Sodium: 140 mEq/L (ref 135–145)
Total Bilirubin: 0.6 mg/dL (ref 0.2–1.2)
Total Protein: 6.3 g/dL (ref 6.0–8.3)

## 2017-11-15 LAB — CBC
HCT: 41.4 % (ref 36.0–46.0)
Hemoglobin: 13.4 g/dL (ref 12.0–15.0)
MCHC: 32.2 g/dL (ref 30.0–36.0)
MCV: 89.9 fl (ref 78.0–100.0)
Platelets: 253 10*3/uL (ref 150.0–400.0)
RBC: 4.61 Mil/uL (ref 3.87–5.11)
RDW: 14.2 % (ref 11.5–15.5)
WBC: 6.4 10*3/uL (ref 4.0–10.5)

## 2017-11-15 LAB — HEMOGLOBIN A1C: Hgb A1c MFr Bld: 6.7 % — ABNORMAL HIGH (ref 4.6–6.5)

## 2017-11-16 ENCOUNTER — Encounter: Payer: Self-pay | Admitting: Internal Medicine

## 2017-11-16 ENCOUNTER — Ambulatory Visit (INDEPENDENT_AMBULATORY_CARE_PROVIDER_SITE_OTHER): Payer: Medicare Other | Admitting: Internal Medicine

## 2017-11-16 VITALS — BP 126/70 | HR 70 | Ht 61.0 in | Wt 181.8 lb

## 2017-11-16 DIAGNOSIS — E782 Mixed hyperlipidemia: Secondary | ICD-10-CM

## 2017-11-16 DIAGNOSIS — Z01810 Encounter for preprocedural cardiovascular examination: Secondary | ICD-10-CM

## 2017-11-16 DIAGNOSIS — I10 Essential (primary) hypertension: Secondary | ICD-10-CM

## 2017-11-16 DIAGNOSIS — R9431 Abnormal electrocardiogram [ECG] [EKG]: Secondary | ICD-10-CM | POA: Diagnosis not present

## 2017-11-16 NOTE — Progress Notes (Signed)
OFFICE FOLLOW-UP NOTE  Chief Complaint:  Preoperative risk assessment  Primary Care Physician: Darreld Mclean, MD  HPI:  Sherry [name] is a 67 y.o. female with a past medial history significant for cerebral aneurysm (not coiled), recurrent TIA, type 2 diabetes, dyslipidemia, mitral valve prolapse, reported congestive heart failure with normal LV EF on echo, hypertension, GERD and hiatal hernia, and degenerative joint disease with prior total knee replacement, who presents with left knee pain desiring left total knee replacement by Dr. Marlou Sa. She previously saw Dr. Johnsie Cancel in 2016, but does not recall the visit. Later she mentioned that she wanted to see me because he did not have any appointments. At the time she presented for intermittent chest discomfort which is described as sharp and fleeting. This was thought to be atypical. She underwent an echocardiogram in stress Myoview in 2016 which was negative for ischemia. She was also consult a by cardiac EP for an implanted loop recorder but declined at the time. She had a repeat echo in March 2017 which showed EF 60-65%, normal LV wall thickness, no regional wall motion abnormalities. No diagnostic mitral valve prolapse was noted. Interestingly, she was told the past that she may need mitral valve replacement however there is no evidence of significant dysfunction of the mitral valve. She denies any chest pain or worsening shortness of breath with exertion and can do more than 4 METS of activity.  11/16/2017  [name] returns today for preoperative risk assessment.  I saw her for evaluation for knee replacement by Dr. Marlou Sa in 2018 however she did not undergo that surgery.  Her knee is now significantly worsened and she is scheduled to have knee replacement with Dr. Lorin Mercy in October.  After I saw her last year than she was admitted to the hospital for possible CHF however felt to have asthma exacerbation.  She underwent nuclear stress  testing and was seen in follow-up by Truitt Merle, NP.  Her nuclear stress test was low risk and since then she is done well.  She denies any further chest pain or worsening shortness of breath.  Her activity level significantly decreased due to her left knee which she has trouble bending.  She lost her husband this past year as well which is put a lot of stress on her.  Despite that she denies any anginal symptoms.  PMHx:  Past Medical History:  Diagnosis Date  . Cerebral aneurysm   . Complication of anesthesia HYPOTENSION INTRAOP W/ HYSTERECTOMY IN 1987--  DID OK W/ TOTAL KNEE IN 2008  . Diabetes mellitus   . DJD (degenerative joint disease)   . Dyslipidemia   . GERD (gastroesophageal reflux disease)   . H/O hiatal hernia   . History of CHF (congestive heart failure) 2010-  RESOLVED  . History of peptic ulcer YRS AGO  . Hypertension   . Meniere's disease   . Mitral valve prolapse occasional palpitations w/ chest discomfort  . OA (osteoarthritis)   . Rotator cuff tear, left   . Stroke [facility])     Past Surgical History:  Procedure Laterality Date  . BUNIONECTOMY  2007   LEFT FOOT  . LEFT SHOULDER SURGERY  1997   ROTATOR CUFF REPAIR  . SHOULDER ARTHROSCOPY  09/20/2011   Procedure: ARTHROSCOPY SHOULDER;  Surgeon: Johnn Hai, MD;  [facility];  Service: Orthopedics;  Laterality: Left;  SAD AND MINI OPEN ROTATOR CUFF REPAIR    . TOTAL KNEE ARTHROPLASTY  09-12-2006  [facility]   RIGHT KNEE  . TUBAL LIGATION  1976  . TYMPANOPLASTY  1990;   1980  . VAGINAL HYSTERECTOMY  1987    FAMHx:  Family History  Problem Relation Age of Onset  . Heart disease Mother        in her 75s, heart attack  . Diabetes Mother   . Kidney disease Mother        dialysis  . Thyroid disease Mother   . Aneurysm Mother   . Ovarian cancer Mother   . Breast cancer Mother   . Thyroid disease Sister   . Heart disease Sister   . Lymphoma Father   . Diabetes Father   . Hypertension  Father   . Congestive Heart Failure Father   . Thyroid disease Brother   . Thyroid disease Paternal Grandmother   . Heart attack Sister        at age 64  . Stroke Sister   . Hypertension Sister   . Breast cancer Maternal Aunt   . Breast cancer Maternal Grandmother   . Breast cancer Maternal Aunt   . Breast cancer Maternal Aunt   . Other Brother        Musician accident  . Bipolar disorder Son   . Other Son        GSW    SOCHx:   reports that she has never smoked. She has never used smokeless tobacco. She reports that she does not drink alcohol or use drugs.  ALLERGIES:  Allergies  Allergen Reactions  . Influenza Vaccines Anaphylaxis  . Codeine Itching  . Demerol Other (See Comments)    SEIZURE  . Mango Flavor [Flavoring Agent] Itching  . Nsaids Other (See Comments)    NAPROXEN, IBUPROFEN, SKELAXIN---  CAUSES PALPITATIONS  . Prednisone Other (See Comments)    Caused her glucose to go up to 500 and went to ED  . Tramadol Other (See Comments)    Contraindicated with Victoza     ROS: Pertinent items noted in HPI and remainder of comprehensive ROS otherwise negative.  HOME MEDS: Current Outpatient Medications on File Prior to Visit  Medication Sig Dispense Refill  . albuterol (PROAIR HFA) 108 (90 Base) MCG/ACT inhaler Inhale 1-2 puffs into the lungs every 6 (six) hours as needed for wheezing or shortness of breath. 18 g 6  . albuterol (PROVENTIL) (2.5 MG/3ML) 0.083% nebulizer solution Take 3 mLs (2.5 mg total) by nebulization every 6 (six) hours as needed for wheezing or shortness of breath. 150 mL 1  . Bioflavonoid Products (VITAMIN C) CHEW Chew 2 each by mouth daily.    . cetirizine (ZYRTEC) 10 MG tablet Take 1 tablet (10 mg total) by mouth daily. 90 tablet 3  . clopidogrel (PLAVIX) 75 MG tablet TAKE 1 TABLET BY MOUTH  DAILY (Patient taking differently: Take 75 mg by mouth daily. ) 90 tablet 3  . docusate sodium (COLACE) 100 MG capsule Take 1 capsule (100 mg total) by mouth  daily. 10 capsule 2  . empagliflozin (JARDIANCE) 10 MG TABS tablet TAKE 10 mg TABLET BY MOUTH  DAILY (Patient taking differently: Take 5 mg by mouth daily. ) 90 tablet 0  . famotidine (PEPCID) 10 MG tablet Take 10 mg by mouth daily.    . fluticasone (FLOVENT HFA) 220 MCG/ACT inhaler Inhale 1 puff into the lungs 2 (two) times daily. May increase to 2 puffs if needed (Patient taking differently: Inhale 2 puffs into the lungs 2 (two) times daily as needed (  for shorness of breath or wheezing). ) 1 Inhaler 12  . furosemide (LASIX) 20 MG tablet Take 1 tablet (20 mg total) by mouth daily. 30 tablet 5  . gabapentin (NEURONTIN) 300 MG capsule TAKE 2 CAPSULES BY MOUTH  TWO TIMES DAILY (Patient taking differently: Take 600 mg by mouth 2 (two) times daily. ) 360 capsule 1  . ipratropium (ATROVENT) 0.03 % nasal spray Place 2 sprays into the nose 4 (four) times daily. (Patient taking differently: Place 2 sprays into the nose 2 (two) times daily as needed for rhinitis. ) 60 mL 0  . Lancets Misc. (ACCU-CHEK SOFTCLIX LANCET DEV) KIT USE AS DIRECTED 1 kit 0  . linagliptin (TRADJENTA) 5 MG TABS tablet Take 0.5 tablets (2.5 mg total) by mouth daily. 45 tablet 0  . lisinopril (PRINIVIL,ZESTRIL) 20 MG tablet Take 1 tablet (20 mg total) by mouth daily. 90 tablet 0  . metFORMIN (GLUCOPHAGE) 500 MG tablet Take 2 tablets (1,000 mg total) by mouth 2 (two) times daily. TAKE 2 TABLETS(1000 MG) BY MOUTH TWICE DAILY WITH A MEAL (Patient taking differently: Take 1,000 mg by mouth 2 (two) times daily. ) 360 tablet 3  . metoprolol tartrate (LOPRESSOR) 25 MG tablet Take 1 tablet (25 mg total) by mouth 2 (two) times daily. 180 tablet 0  . montelukast (SINGULAIR) 10 MG tablet Take 1 tablet (10 mg total) by mouth at bedtime. 30 tablet 3  . Multiple Vitamins-Minerals (MULTIVITAMIN PO) Take 2 tablets by mouth daily.    . QUEtiapine (SEROQUEL) 100 MG tablet Take 0.5 tablets (50 mg total) by mouth at bedtime. 45 tablet 3  . sertraline  (ZOLOFT) 50 MG tablet TAKE ONE-HALF TABLET BY  MOUTH AT BEDTIME (Patient taking differently: Take 25 mg by mouth at bedtime. ) 45 tablet 3   No current facility-administered medications on file prior to visit.     LABS/IMAGING: Results for orders placed or performed in visit on 11/14/17 (from the past 48 hour(s))  CBC     Status: None   Collection Time: 11/14/17  3:24 PM  Result Value Ref Range   WBC 6.4 4.0 - 10.5 K/uL   RBC 4.61 3.87 - 5.11 Mil/uL   Platelets 253.0 150.0 - 400.0 K/uL   Hemoglobin 13.4 12.0 - 15.0 g/dL   HCT 41.4 36.0 - 46.0 %   MCV 89.9 78.0 - 100.0 fl   MCHC 32.2 30.0 - 36.0 g/dL   RDW 14.2 11.5 - 15.5 %  Comprehensive metabolic panel     Status: Abnormal   Collection Time: 11/14/17  3:24 PM  Result Value Ref Range   Sodium 140 135 - 145 mEq/L   Potassium 4.0 3.5 - 5.1 mEq/L   Chloride 103 96 - 112 mEq/L   CO2 29 19 - 32 mEq/L   Glucose, Bld 105 (H) 70 - 99 mg/dL   BUN 16 6 - 23 mg/dL   Creatinine, Ser 0.73 0.40 - 1.20 mg/dL   Total Bilirubin 0.6 0.2 - 1.2 mg/dL   Alkaline Phosphatase 58 39 - 117 U/L   AST 12 0 - 37 U/L   ALT 10 0 - 35 U/L   Total Protein 6.3 6.0 - 8.3 g/dL   Albumin 4.0 3.5 - 5.2 g/dL   Calcium 9.2 8.4 - 10.5 mg/dL   GFR 102.31 >60.00 mL/min  Hemoglobin A1c     Status: Abnormal   Collection Time: 11/14/17  3:24 PM  Result Value Ref Range   Hgb A1c MFr Bld 6.7 (H)  4.6 - 6.5 %    Comment: Glycemic Control Guidelines for People with Diabetes:Non Diabetic:  <6%Goal of Therapy: <7%Additional Action Suggested:  >8%    No results found.  LIPID PANEL:    Component Value Date/Time   CHOL 114 08/20/2016 0436   TRIG 58 08/20/2016 0436   HDL 51 08/20/2016 0436   CHOLHDL 2.2 08/20/2016 0436   VLDL 12 08/20/2016 0436   LDLCALC 51 08/20/2016 0436     WEIGHTS: Wt Readings from Last 3 Encounters:  11/16/17 181 lb 12.8 oz (82.5 kg)  11/14/17 180 lb (81.6 kg)  03/17/17 187 lb 12.8 oz (85.2 kg)    VITALS: BP 126/70   Pulse 70   Ht 5'  1" (1.549 m)   Wt 181 lb 12.8 oz (82.5 kg)   BMI 34.35 kg/m   EXAM: General appearance: alert and no distress Neck: no carotid bruit and no JVD Lungs: clear to auscultation bilaterally Heart: regular rate and rhythm Abdomen: soft, non-tender; bowel sounds normal; no masses,  no organomegaly Extremities: extremities normal, atraumatic, no cyanosis or edema Pulses: 2+ and symmetric Skin: Skin color, texture, turgor normal. No rashes or lesions Neurologic: GroPsych: Pleasanty normal Psych: Pleasant  EKG: Sinus rhythm 70, nonspecific T wave changes-personally reviewed  ASSESSMENT: 1. Low risk for upcoming knee surgery 2. Hypertension 3. Dyslipidemia 4. History of recurrent TIA 5. Cerebral aneurysm  PLAN: 1.   Mrs. Beagley is at acceptable risk for upcoming knee surgery.  She has well-controlled hypertension and no signs or symptoms of chest pain.  She underwent nuclear stress testing just over one year ago which was low risk and showed normal LV function.  This still has good negative predictive power.  She is had no recurrent TIA and is followed closely for cerebral aneurysm.  Overall I think she can undergo knee surgery as planned.  Follow-up with me annually or sooner as necessary.  Pixie Casino, MD, [facility], [place] Director of the [facility] Lipid Disorders &  Cardiovascular Risk Reduction [facility] Diplomate of the American Board of Clinical Lipidology Attending Cardiologist  Direct Dial: [phone]  Fax: [phone]  Website:  [email or web address] Antwan Pandya 11/16/2017, 8:13 AM

## 2017-11-16 NOTE — Patient Instructions (Addendum)
Medication Instructions:  Continue current medications  If you need a refill on your cardiac medications before your next appointment, please call your pharmacy.    Follow-Up: At [facility], you and your health needs are our priority.  As part of our continuing mission to provide you with exceptional heart care, we have created designated Provider Care Teams.  These Care Teams include your primary Cardiologist (physician) and Advanced Practice Providers (APPs -  Physician Assistants and Nurse Practitioners) who all work together to provide you with the care you need, when you need it. You will need a follow up appointment in 1 year.  Please call our office 2 months in advance (August 2020) to schedule this appointment.  You may see Pixie Casino, MD or one of the following Advanced Practice Providers on your designated Care Team: [place], [place] . Fabian Sharp, PA-C  Any Other Special Instructions Will Be Listed Below (If Applicable). Dr. Debara Pickett has cleared you for surgery. He will send his note to Dr. Lorin Mercy

## 2017-11-17 ENCOUNTER — Ambulatory Visit (INDEPENDENT_AMBULATORY_CARE_PROVIDER_SITE_OTHER): Payer: Medicare Other | Admitting: Surgery

## 2017-11-17 ENCOUNTER — Encounter (INDEPENDENT_AMBULATORY_CARE_PROVIDER_SITE_OTHER): Payer: Self-pay | Admitting: Surgery

## 2017-11-17 VITALS — BP 119/68 | HR 74 | Ht 61.0 in | Wt 180.0 lb

## 2017-11-17 DIAGNOSIS — M1712 Unilateral primary osteoarthritis, left knee: Secondary | ICD-10-CM

## 2017-11-17 NOTE — Pre-Procedure Instructions (Signed)
Sherry [name]  11/17/2017      Your procedure is scheduled on November 30, 2017.  Report to [facility] Admitting at 10:30 A.M.  Call this number if you have problems the morning of surgery:  [phone]   Remember:  Do not eat or drink after midnight.     Take these medicines the morning of surgery with A SIP OF WATER : albuterol (PROAIR HFA)--bring with you cetirizine (ZYRTEC) famotidine (PEPCID) fluticasone (FLOVENT HFA) if needed--bring with you gabapentin (NEURONTIN) metoprolol tartrate (LOPRESSOR) montelukast (SINGULAIR)  Follow your Doctor's instructions regarding your PLAVIX.  If no instructions were given, call your prescribing doctor's office.   7 days prior to surgery STOP taking any Aspirin (unless otherwise instructed by your surgeon), Aleve, Naproxen, Ibuprofen, Motrin, Advil, Goody's, BC's, all herbal medications, fish oil, and all vitamins.     How to Manage Your Diabetes Before and After Surgery  Why is it important to control my blood sugar before and after surgery? . Improving blood sugar levels before and after surgery helps healing and can limit problems. . A way of improving blood sugar control is eating a healthy diet by: o  Eating less sugar and carbohydrates o  Increasing activity/exercise o  Talking with your doctor about reaching your blood sugar goals . High blood sugars (greater than 180 mg/dL) can raise your risk of infections and slow your recovery, so you will need to focus on controlling your diabetes during the weeks before surgery. . Make sure that the doctor who takes care of your diabetes knows about your planned surgery including the date and location.  How do I manage my blood sugar before surgery? . Check your blood sugar at least 4 times a day, starting 2 days before surgery, to make sure that the level is not too high or low. o Check your blood sugar the morning of your surgery when you wake up and every 2 hours  until you get to the Short Stay unit. . If your blood sugar is less than 70 mg/dL, you will need to treat for low blood sugar: o Do not take insulin. o Treat a low blood sugar (less than 70 mg/dL) with  cup of clear juice (cranberry or apple), 4 glucose tablets, OR glucose gel. Recheck blood sugar in 15 minutes after treatment (to make sure it is greater than 70 mg/dL). If your blood sugar is not greater than 70 mg/dL on recheck, call [phone] o  for further instructions. . Report your blood sugar to the short stay nurse when you get to Short Stay.  . If you are admitted to the hospital after surgery: o Your blood sugar will be checked by the staff and you will probably be given insulin after surgery (instead of oral diabetes medicines) to make sure you have good blood sugar levels. o The goal for blood sugar control after surgery is 80-180 mg/dL.      WHAT DO I DO ABOUT MY DIABETES MEDICATION?   Marland Kitchen Do not take oral diabetes medicines (pills) the morning of surgery. DO NOT TAKE: empagliflozin (Jardiance), Linagliptin (Tradjenta), or Metformin (Glucophage) the morning of surgery.         Do not wear jewelry, make-up or nail polish.  Do not wear lotions, powders, or perfumes, or deodorant.  Do not shave 48 hours prior to surgery.    Do not bring valuables to the hospital.   [facility] is not responsible for any belongings or  valuables.  Contacts, dentures or bridgework may not be worn into surgery.  Leave your suitcase in the car.  After surgery it may be brought to your room.  For patients admitted to the hospital, discharge time will be determined by your treatment team.  Patients discharged the day of surgery will not be allowed to drive home.   Special instructions:   [place]- Preparing For Surgery  Before surgery, you can play an important role. Because skin is not sterile, your skin needs to be as free of germs as possible. You can reduce the number of germs on  your skin by washing with CHG (chlorahexidine gluconate) Soap before surgery.  CHG is an antiseptic cleaner which kills germs and bonds with the skin to continue killing germs even after washing.    Oral Hygiene is also important to reduce your risk of infection.  Remember - BRUSH YOUR TEETH THE MORNING OF SURGERY WITH YOUR REGULAR TOOTHPASTE  Please do not use if you have an allergy to CHG or antibacterial soaps. If your skin becomes reddened/irritated stop using the CHG.  Do not shave (including legs and underarms) for at least 48 hours prior to first CHG shower. It is OK to shave your face.  Please follow these instructions carefully.   1. Shower the NIGHT BEFORE SURGERY and the MORNING OF SURGERY with CHG.   2. If you chose to wash your hair, wash your hair first as usual with your normal shampoo.  3. After you shampoo, rinse your hair and body thoroughly to remove the shampoo.  4. Use CHG as you would any other liquid soap. You can apply CHG directly to the skin and wash gently with a scrungie or a clean washcloth.   5. Apply the CHG Soap to your body ONLY FROM THE NECK DOWN.  Do not use on open wounds or open sores. Avoid contact with your eyes, ears, mouth and genitals (private parts). Wash Face and genitals (private parts)  with your normal soap.  6. Wash thoroughly, paying special attention to the area where your surgery will be performed.  7. Thoroughly rinse your body with warm water from the neck down.  8. DO NOT shower/wash with your normal soap after using and rinsing off the CHG Soap.  9. Pat yourself dry with a CLEAN TOWEL.  10. Wear CLEAN PAJAMAS to bed the night before surgery, wear comfortable clothes the morning of surgery  11. Place CLEAN SHEETS on your bed the night of your first shower and DO NOT SLEEP WITH PETS.    Day of Surgery:  Do not apply any deodorants/lotions.  Please wear clean clothes to the hospital/surgery center.   Remember to brush your teeth  WITH YOUR REGULAR TOOTHPASTE.    Please read over the following fact sheets that you were given.

## 2017-11-21 ENCOUNTER — Other Ambulatory Visit: Payer: Self-pay

## 2017-11-21 ENCOUNTER — Encounter (HOSPITAL_COMMUNITY): Payer: Self-pay

## 2017-11-21 ENCOUNTER — Encounter (HOSPITAL_COMMUNITY)
Admission: RE | Admit: 2017-11-21 | Discharge: 2017-11-21 | Disposition: A | Discharge status: Home / Self Care | Payer: Medicare Other | Source: Ambulatory Visit | Attending: Orthopaedic Surgery | Admitting: Orthopaedic Surgery

## 2017-11-21 DIAGNOSIS — K449 Diaphragmatic hernia without obstruction or gangrene: Secondary | ICD-10-CM | POA: Insufficient documentation

## 2017-11-21 DIAGNOSIS — I110 Hypertensive heart disease with heart failure: Secondary | ICD-10-CM | POA: Insufficient documentation

## 2017-11-21 DIAGNOSIS — K219 Gastro-esophageal reflux disease without esophagitis: Secondary | ICD-10-CM | POA: Diagnosis not present

## 2017-11-21 DIAGNOSIS — Z8673 Personal history of transient ischemic attack (TIA), and cerebral infarction without residual deficits: Secondary | ICD-10-CM | POA: Diagnosis not present

## 2017-11-21 DIAGNOSIS — Z01812 Encounter for preprocedural laboratory examination: Secondary | ICD-10-CM | POA: Diagnosis present

## 2017-11-21 DIAGNOSIS — I509 Heart failure, unspecified: Secondary | ICD-10-CM | POA: Diagnosis not present

## 2017-11-21 DIAGNOSIS — M1712 Unilateral primary osteoarthritis, left knee: Secondary | ICD-10-CM | POA: Insufficient documentation

## 2017-11-21 DIAGNOSIS — Z79899 Other long term (current) drug therapy: Secondary | ICD-10-CM | POA: Diagnosis not present

## 2017-11-21 HISTORY — DX: Unspecified asthma, uncomplicated: J45.909

## 2017-11-21 LAB — BASIC METABOLIC PANEL
Anion gap: 11 (ref 5–15)
BUN: 12 mg/dL (ref 8–23)
CO2: 26 mmol/L (ref 22–32)
Calcium: 8.9 mg/dL (ref 8.9–10.3)
Chloride: 104 mmol/L (ref 98–111)
Creatinine, Ser: 0.73 mg/dL (ref 0.44–1.00)
GFR calc Af Amer: >60 mL/min (ref >60)
GFR calc non Af Amer: >60 mL/min (ref >60)
Glucose, Bld: 192 mg/dL — ABNORMAL HIGH (ref 70–99)
Potassium: 3.4 mmol/L — ABNORMAL LOW (ref 3.5–5.1)
Sodium: 141 mmol/L (ref 135–145)

## 2017-11-21 LAB — SURGICAL PCR SCREEN
MRSA, PCR: NEGATIVE
Staphylococcus aureus: NEGATIVE

## 2017-11-21 LAB — PROTIME-INR
INR: 0.98
Prothrombin Time: 12.9 seconds (ref 11.4–15.2)

## 2017-11-21 LAB — CBC
HCT: 42.4 % (ref 36.0–46.0)
Hemoglobin: 13 g/dL (ref 12.0–15.0)
MCH: 28.4 pg (ref 26.0–34.0)
MCHC: 30.7 g/dL (ref 30.0–36.0)
MCV: 92.6 fL (ref 80.0–100.0)
Platelets: 262 10*3/uL (ref 150–400)
RBC: 4.58 MIL/uL (ref 3.87–5.11)
RDW: 13.8 % (ref 11.5–15.5)
WBC: 7.3 10*3/uL (ref 4.0–10.5)
nRBC: 0 % (ref 0.0–0.2)

## 2017-11-21 LAB — GLUCOSE, CAPILLARY: Glucose-Capillary: 173 mg/dL — ABNORMAL HIGH (ref 70–99)

## 2017-11-21 NOTE — Progress Notes (Addendum)
PCP: Silvestre Mesi, MD  Cardiologist: Lyman Bishop, MD  EKG: 11/16/17 in EPIC  Stress test: 08/2016 in EPIC  ECHO: 04/2015 in EPIC  Cardiac Cath: 10+ years ago-through [facility]  Chest x-ray: 03/09/17 in Epic  Patient is on plavix-has not received any instructions on when to hold medication.  She has an aneurysm and plavix is managed by [facility].  Advised patient to call Dr. Lorin Mercy today to get instructions.

## 2017-11-22 NOTE — Progress Notes (Signed)
Anesthesia Chart Review:  Case:  [id number] Date/Time:  11/30/17 1215   Procedure:  LEFT TOTAL KNEE REPLACEMENT (Left )   Anesthesia type:  Choice   Pre-op diagnosis:  LEFT KNEE DEGENERATIVE JOINT DISEASE   Location:  [facility] OR ROOM 03 / [place] OR   Surgeon:  [name], MD       DISCUSSION: 67 yo female never smoker. Pertinent hx includes cerebral aneurysm (not coiled), recurrent TIA, type 2 diabetes, dyslipidemia, mitral valve prolapse, reported congestive heart failure with normal LV EF on echo, hypertension, GERD and hiatal hernia, and degenerative joint disease with prior total knee replacement.  Medical clearance by Dr. Lorelei Pont 11/14/2017.  Cardiac clearance by Dr. Debara Pickett 11/16/2017 states "Mrs. Key is at acceptable risk for upcoming knee surgery.  She has well-controlled hypertension and no signs or symptoms of chest pain.  She underwent nuclear stress testing just over one year ago which was low risk and showed normal LV function.  This still has good negative predictive power.  She is had no recurrent TIA and is followed closely for cerebral aneurysm.  Overall I think she can undergo knee surgery as planned."  Per neurology she is to stop 5 days prior to surgery and resume after surgery  Anticipate she can proceed as planned barring acute status change.  VS: BP 103/67   Pulse 72   Temp 36.7 C   Resp 18   Ht _0  (1.549 m)   Wt 81.6 kg   SpO2 98%   BMI 34.01 kg/m   PROVIDERS: Copland, Gay Filler, MD is PCP  Lyman Bishop, MD is Cardiologist  Evlyn Courier, NP is Neurology provider   LABS: Labs reviewed: Acceptable for surgery. (all labs ordered are listed, but only abnormal results are displayed)  Labs Reviewed  GLUCOSE, CAPILLARY - Abnormal; Notable for the following components:      Result Value   Glucose-Capillary 173 (*)    All other components within normal limits  BASIC METABOLIC PANEL - Abnormal; Notable for the following components:   Potassium 3.4 (*)    Glucose, Bld 192 (*)    All other components within normal limits  SURGICAL PCR SCREEN  CBC  PROTIME-INR     IMAGES: CHEST  2 VIEW 03/09/2017  COMPARISON:  08/19/2016  FINDINGS: The heart size and mediastinal contours are within normal limits. Both lungs are clear. The visualized skeletal structures are unremarkable.  IMPRESSION: No active cardiopulmonary disease.   EKG: 11/16/2017: NSR rate 70. Nonspecific t wave abnormality.  CV: Myocardial perfusion imaging 09/07/2016:  The left ventricular ejection fraction is hyperdynamic (>65%).  Nuclear stress EF: 68%.  There was no ST segment deviation noted during stress.  The study is normal.  This is a low risk study.   Low risk stress nuclear study with normal perfusion and normal left ventricular regional and global systolic function.  TTE 04/15/2015: Study Conclusions  - Left ventricle: The cavity size was normal. Wall thickness was   normal. Systolic function was normal. The estimated ejection   fraction was in the range of 60% to 65%. Wall motion was normal;   there were no regional wall motion abnormalities.  Past Medical History:  Diagnosis Date  . Asthma   . Cerebral aneurysm   . Complication of anesthesia HYPOTENSION INTRAOP W/ HYSTERECTOMY IN 1987--  DID OK W/ TOTAL KNEE IN 2008  . Diabetes mellitus   . DJD (degenerative joint disease)   . Dyslipidemia   . GERD (gastroesophageal reflux  disease)   . H/O hiatal hernia   . History of CHF (congestive heart failure) 2010-  RESOLVED  . History of peptic ulcer YRS AGO  . Hypertension   . Meniere's disease   . Mitral valve prolapse occasional palpitations w/ chest discomfort  . OA (osteoarthritis)   . Rotator cuff tear, left   . Stroke [facility])     Past Surgical History:  Procedure Laterality Date  . BUNIONECTOMY  2007   LEFT FOOT  . LEFT SHOULDER SURGERY  1997   ROTATOR CUFF REPAIR  . SHOULDER ARTHROSCOPY  09/20/2011   Procedure: ARTHROSCOPY  SHOULDER;  Surgeon: Johnn Hai, MD;  Location[facility];  Service: Orthopedics;  Laterality: Left;  SAD AND MINI OPEN ROTATOR CUFF REPAIR    . TOTAL KNEE ARTHROPLASTY  09-12-2006  [facility]   RIGHT KNEE  . TUBAL LIGATION  1976  . TYMPANOPLASTY  1990;   1980  . VAGINAL HYSTERECTOMY  1987    MEDICATIONS: . albuterol (PROAIR HFA) 108 (90 Base) MCG/ACT inhaler  . albuterol (PROVENTIL) (2.5 MG/3ML) 0.083% nebulizer solution  . Bioflavonoid Products (VITAMIN C) CHEW  . cetirizine (ZYRTEC) 10 MG tablet  . clopidogrel (PLAVIX) 75 MG tablet  . docusate sodium (COLACE) 100 MG capsule  . empagliflozin (JARDIANCE) 10 MG TABS tablet  . famotidine (PEPCID) 10 MG tablet  . fluticasone (FLOVENT HFA) 220 MCG/ACT inhaler  . furosemide (LASIX) 20 MG tablet  . gabapentin (NEURONTIN) 300 MG capsule  . ipratropium (ATROVENT) 0.03 % nasal spray  . Lancets Misc. (ACCU-CHEK SOFTCLIX LANCET DEV) KIT  . linagliptin (TRADJENTA) 5 MG TABS tablet  . lisinopril (PRINIVIL,ZESTRIL) 20 MG tablet  . metFORMIN (GLUCOPHAGE) 500 MG tablet  . metoprolol tartrate (LOPRESSOR) 25 MG tablet  . montelukast (SINGULAIR) 10 MG tablet  . Multiple Vitamins-Minerals (MULTIVITAMIN PO)  . QUEtiapine (SEROQUEL) 100 MG tablet  . sertraline (ZOLOFT) 50 MG tablet   No current facility-administered medications for this encounter.     Wynonia Musty [facility] Short Stay Center/Anesthesiology Phone [phone] 11/24/2017 11:44 AM

## 2017-11-23 NOTE — Progress Notes (Signed)
[facility]  PATIENT: Sherry [name] DOB: 1950-09-10   REASON FOR VISIT: Follow-up for history of TIA 04/2015 HISTORY FROM: Patient    HISTORY OF PRESENT ILLNESS: [name] is a 67 y.o. female with history of HTN, DM, HLD, MVP admitted on 11/11/14 for complaints of facial droop, decreased sensation left face as well as stabbing sensation in her left shoulder and arm pit after an argument with husband. She appeared to be anxious on exam. 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 Stroke work up with TTE and CUS unremarkable. LDL 78 and A1C 7.4. She was considered possible TIA due to multiple risk factors but also not to exclude from anxiety as the etiology. She was discharged with ASA and lipitor.   01/22/15 follow up - the patient has been doing well. No recurrent stroke like symptoms. Had seen Dr. DEstanislado Pandyfor aneurysm and cerebral angio showed right M1 75% stenosis and right 7x455msaccular aneurysm at right ICA cavernous segment. Pt optioned conservative treatment towards aneurysm so far and plan for another CTA in 6 months. Her glucose at home from 51-139 and BP in good control. Today BP 134/68.    05/26/15 follow-up - she was admitted on 04/14/15 for left facial numbness and drooping. MRI no acute stroke, and MRA no LVO. CUS and TTE unremarkable. A1C 9.7 and LDL was -7. She admitted that she was under a lot of stress at that time including her 1737o grandson made her crazy. She was continued on ASA and lipitor on discharge with diagnosis of possible TIA although anxiety more likely. Since then, she was doing fine. Still struggling with DM control. Today BP 131/69.  Interval History1/7/19 Dr. XuErlinda Honguring the interval time, patient has been stable.  However, patient again referred here for multiple complaints.  Patient stated that she still has imbalance with a turning and intermittent falling.  Her A1c recently 6.4, better  controlled than before, however, still has neuropathy in her gabapentin was increased recently to 600 mg 3 times a day.  Complains of achy back pain and neck pain.  Complains of achy headache on the top of the head with occasionally shooting pain from the neck to the scalp.  Still has occasional episodic left facial numbness and left facial droop.  BP today 139/84.  She has been on both aspirin 325 and Plavix for a while. UPDATE 10/17/2019CM [name] old female returns for follow-up with history of TIA versus anxiety in 2017 MRI no acute stroke.  MRA showed 5 mm right cavernous ICA aneurysm.  Carotid ultrasound unremarkable.  Repeat MRA after her last visit in January with Dr. XuErlinda Honghowed stable aneurysm and vasculature no change from prior scan.  She remains on Plavix for secondary stroke prevention without further stroke or TIA symptoms.  Hemoglobin A1c stable at 6.7 on 11/14/2017.  Blood pressure in the office today 120/69.  EMG nerve conduction after her last visit shows bilateral carpal tunnel syndrome.  She returns for reevaluation she needs clearance for knee replacement next week.  She is aware that if she goes off the Plavix there is a slight risk that she could have a stroke or TIA. REVIEW OF SYSTEMS: Full 14 system review of systems performed and notable only for those listed, all others are neg:  Constitutional: neg  Cardiovascular: neg Ear/Nose/Throat: neg  Skin: neg Eyes: neg Respiratory: neg Gastroitestinal: neg  Hematology/Lymphatic: neg  Endocrine: neg Musculoskeletal: Knee pain,  walking difficulty Allergy/Immunology: neg Neurological: neg Psychiatric: Anxiety Sleep : neg   ALLERGIES: Allergies  Allergen Reactions  . Influenza Vaccines Anaphylaxis  . Codeine Itching  . Demerol Other (See Comments)    SEIZURE  . Mango Flavor [Flavoring Agent] Itching  . Nsaids Other (See Comments)    NAPROXEN, IBUPROFEN, SKELAXIN---  CAUSES PALPITATIONS  . Prednisone Other (See  Comments)    Caused her glucose to go up to 500 and went to ED  . Tramadol Other (See Comments)    Contraindicated with Victoza     HOME MEDICATIONS: Outpatient Medications Prior to Visit  Medication Sig Dispense Refill  . albuterol (PROAIR HFA) 108 (90 Base) MCG/ACT inhaler Inhale 1-2 puffs into the lungs every 6 (six) hours as needed for wheezing or shortness of breath. 18 g 6  . albuterol (PROVENTIL) (2.5 MG/3ML) 0.083% nebulizer solution Take 3 mLs (2.5 mg total) by nebulization every 6 (six) hours as needed for wheezing or shortness of breath. 150 mL 1  . Bioflavonoid Products (VITAMIN C) CHEW Chew 2 each by mouth daily.    . cetirizine (ZYRTEC) 10 MG tablet Take 1 tablet (10 mg total) by mouth daily. 90 tablet 3  . clopidogrel (PLAVIX) 75 MG tablet TAKE 1 TABLET BY MOUTH  DAILY (Patient taking differently: Take 75 mg by mouth daily. ) 90 tablet 3  . docusate sodium (COLACE) 100 MG capsule Take 1 capsule (100 mg total) by mouth daily. 10 capsule 2  . empagliflozin (JARDIANCE) 10 MG TABS tablet TAKE 10 mg TABLET BY MOUTH  DAILY (Patient taking differently: Take 5 mg by mouth daily. ) 90 tablet 0  . famotidine (PEPCID) 10 MG tablet Take 10 mg by mouth daily.    . fluticasone (FLOVENT HFA) 220 MCG/ACT inhaler Inhale 1 puff into the lungs 2 (two) times daily. May increase to 2 puffs if needed (Patient taking differently: Inhale 2 puffs into the lungs 2 (two) times daily as needed (for shorness of breath or wheezing). ) 1 Inhaler 12  . furosemide (LASIX) 20 MG tablet Take 1 tablet (20 mg total) by mouth daily. 30 tablet 5  . gabapentin (NEURONTIN) 300 MG capsule TAKE 2 CAPSULES BY MOUTH  TWO TIMES DAILY (Patient taking differently: Take 600 mg by mouth 2 (two) times daily. ) 360 capsule 1  . ipratropium (ATROVENT) 0.03 % nasal spray Place 2 sprays into the nose 4 (four) times daily. (Patient taking differently: Place 2 sprays into the nose 2 (two) times daily as needed for rhinitis. ) 60 mL 0  .  Lancets Misc. (ACCU-CHEK SOFTCLIX LANCET DEV) KIT USE AS DIRECTED 1 kit 0  . linagliptin (TRADJENTA) 5 MG TABS tablet Take 0.5 tablets (2.5 mg total) by mouth daily. 45 tablet 0  . lisinopril (PRINIVIL,ZESTRIL) 20 MG tablet Take 1 tablet (20 mg total) by mouth daily. 90 tablet 0  . metFORMIN (GLUCOPHAGE) 500 MG tablet Take 2 tablets (1,000 mg total) by mouth 2 (two) times daily. TAKE 2 TABLETS(1000 MG) BY MOUTH TWICE DAILY WITH A MEAL (Patient taking differently: Take 1,000 mg by mouth 2 (two) times daily. ) 360 tablet 3  . metoprolol tartrate (LOPRESSOR) 25 MG tablet Take 1 tablet (25 mg total) by mouth 2 (two) times daily. 180 tablet 0  . montelukast (SINGULAIR) 10 MG tablet Take 1 tablet (10 mg total) by mouth at bedtime. 30 tablet 3  . Multiple Vitamins-Minerals (MULTIVITAMIN PO) Take 2 tablets by mouth daily.    . QUEtiapine (SEROQUEL) 100  MG tablet Take 0.5 tablets (50 mg total) by mouth at bedtime. 45 tablet 3  . sertraline (ZOLOFT) 50 MG tablet TAKE ONE-HALF TABLET BY  MOUTH AT BEDTIME (Patient taking differently: Take 25 mg by mouth at bedtime. ) 45 tablet 3   No facility-administered medications prior to visit.     PAST MEDICAL HISTORY: Past Medical History:  Diagnosis Date  . Asthma   . Cerebral aneurysm   . Complication of anesthesia HYPOTENSION INTRAOP W/ HYSTERECTOMY IN 1987--  DID OK W/ TOTAL KNEE IN 2008  . Diabetes mellitus   . DJD (degenerative joint disease)   . Dyslipidemia   . GERD (gastroesophageal reflux disease)   . H/O hiatal hernia   . History of CHF (congestive heart failure) 2010-  RESOLVED  . History of peptic ulcer YRS AGO  . Hypertension   . Meniere's disease   . Mitral valve prolapse occasional palpitations w/ chest discomfort  . OA (osteoarthritis)   . Rotator cuff tear, left   . Stroke [facility])     PAST SURGICAL HISTORY: Past Surgical History:  Procedure Laterality Date  . BUNIONECTOMY  2007   LEFT FOOT  . LEFT SHOULDER SURGERY  1997   ROTATOR  CUFF REPAIR  . SHOULDER ARTHROSCOPY  09/20/2011   Procedure: ARTHROSCOPY SHOULDER;  Surgeon: Johnn Hai, MD;  Location: [facility];  Service: Orthopedics;  Laterality: Left;  SAD AND MINI OPEN ROTATOR CUFF REPAIR    . TOTAL KNEE ARTHROPLASTY  09-12-2006  [facility]   RIGHT KNEE  . TUBAL LIGATION  1976  . TYMPANOPLASTY  1990;   1980  . VAGINAL HYSTERECTOMY  1987    FAMILY HISTORY: Family History  Problem Relation Age of Onset  . Heart disease Mother        in her 17s, heart attack  . Diabetes Mother   . Kidney disease Mother        dialysis  . Thyroid disease Mother   . Aneurysm Mother   . Ovarian cancer Mother   . Breast cancer Mother   . Thyroid disease Sister   . Heart disease Sister   . Lymphoma Father   . Diabetes Father   . Hypertension Father   . Congestive Heart Failure Father   . Thyroid disease Brother   . Thyroid disease Paternal Grandmother   . Heart attack Sister        at age 94  . Stroke Sister   . Hypertension Sister   . Breast cancer Maternal Aunt   . Breast cancer Maternal Grandmother   . Breast cancer Maternal Aunt   . Breast cancer Maternal Aunt   . Other Brother        Musician accident  . Bipolar disorder Son   . Other Son        GSW    SOCIAL HISTORY: Social History   Socioeconomic History  . Marital status: Widowed    Spouse name: Not on file  . Number of children: 4  . Years of education: BA  . Highest education level: Not on file  Occupational History  . Occupation: LPN - retired  Scientific laboratory technician  . Financial resource strain: Not on file  . Food insecurity:    Worry: Not on file    Inability: Not on file  . Transportation needs:    Medical: Not on file    Non-medical: Not on file  Tobacco Use  . Smoking status: Never Smoker  . Smokeless tobacco: Never  Used  Substance and Sexual Activity  . Alcohol use: No  . Drug use: No  . Sexual activity: Not on file  Lifestyle  . Physical activity:    Days per week: Not on  file    Minutes per session: Not on file  . Stress: Not on file  Relationships  . Social connections:    Talks on phone: Not on file    Gets together: Not on file    Attends religious service: Not on file    Active member of club or organization: Not on file    Attends meetings of clubs or organizations: Not on file    Relationship status: Not on file  . Intimate partner violence:    Fear of current or ex partner: Not on file    Emotionally abused: Not on file    Physically abused: Not on file    Forced sexual activity: Not on file  Other Topics Concern  . Not on file  Social History Narrative  . Not on file     PHYSICAL EXAM  Vitals:   11/24/17 1000  BP: 120/69  Pulse: (!) 58  Weight: 183 lb 9.6 oz (83.3 kg)  Height: 5' 1" (1.549 m)   Body mass index is 34.69 kg/m.  Generalized: Well developed, obese female in no acute distress  Head: normocephalic and atraumatic,. Oropharynx benign  Neck: Supple, no carotid bruits  Cardiac: Regular rate rhythm, no murmur  Musculoskeletal: No deformity   Neurological examination   Mentation: Alert oriented to time, place, history taking. Attention span and concentration appropriate. Recent and remote memory intact.  Follows all commands speech and language fluent.   Cranial nerve II-XII: Pupils were equal round reactive to light extraocular movements were full, visual field were full on confrontational test. Facial sensation and strength were normal. hearing was intact to finger rubbing bilaterally. Uvula tongue midline. head turning and shoulder shrug were normal and symmetric.Tongue protrusion into cheek strength was normal. Motor: normal bulk and tone, full strength in the BUE, BLE, Sensory: normal and symmetric to light touch, pinprick, and  Vibration, in the upper and lower extremities Coordination: finger-nose-finger, heel-to-shin bilaterally, no dysmetria, no tremor Reflexes: 1+ upper lower and symmetric responses were flexor  bilaterally. Gait and Station: Rising up from seated position with push off, ambulates with single-point cane has a limp due to knee pain  DIAGNOSTIC DATA (LABS, IMAGING, TESTING) - I reviewed patient records, labs, notes, testing and imaging myself where available.  Lab Results  Component Value Date   WBC 7.3 11/21/2017   HGB 13.0 11/21/2017   HCT 42.4 11/21/2017   MCV 92.6 11/21/2017   PLT 262 11/21/2017      Component Value Date/Time   NA 141 11/21/2017 1514   K 3.4 (L) 11/21/2017 1514   CL 104 11/21/2017 1514   CO2 26 11/21/2017 1514   GLUCOSE 192 (H) 11/21/2017 1514   BUN 12 11/21/2017 1514   CREATININE 0.73 11/21/2017 1514   CREATININE 0.86 02/09/2014 1756   CALCIUM 8.9 11/21/2017 1514   PROT 6.3 11/14/2017 1524   ALBUMIN 4.0 11/14/2017 1524   AST 12 11/14/2017 1524   ALT 10 11/14/2017 1524   ALKPHOS 58 11/14/2017 1524   BILITOT 0.6 11/14/2017 1524   GFRNONAA >60 11/21/2017 1514   GFRAA >60 11/21/2017 1514   Lab Results  Component Value Date   CHOL 114 08/20/2016   HDL 51 08/20/2016   LDLCALC 51 08/20/2016   TRIG 58 08/20/2016  CHOLHDL 2.2 08/20/2016   Lab Results  Component Value Date   HGBA1C 6.7 (H) 11/14/2017   No results found for: [id number] Lab Results  Component Value Date   TSH 0.68 05/19/2016      ASSESSMENT AND PLAN 67 y.o. African American female with PMH of HTN, DM, HLD, MVP admitted on 11/11/14 for complaints of facial droop, decreased sensation left face as well as stabbing sensation in her left shoulder and arm pit after an argument with husband. MRI showed no acute infarct and MRA showed 56m right cavernous ICA aneurysm. Stroke work up with TTE and CUS unremarkable. LDL 78 and A1C 7.4. She was considered possible TIA due to multiple risk factors but also not to exclude from anxiety as the etiology. She was discharged with ASA and lipitor. During the interval time, she saw Dr. DEstanislado Pandyfor aneurysm and cerebral angio showed right M1 75%  stenosis and right 7x426msaccular aneurysm at right ICA cavernous segment. Pt optioned conservative treatment towards aneurysm so far. 04/2015 was admitted for possible right brain TIA vs. Anxiety.  Of the time, patient has multiple complaints with imbalance, occasionally falling back pain, neck pain, headache with occipital neuralgia, occasionally episodic left facial numbness and drooping.  Symptoms consistent with occipital neuralgia, cervical radiculopathy, stress/anxiety related symptoms.  Will do EMG  and refer to PT.  However, patient has been on dual antiplatelet for more than a year, will change to monotherapy.  Repeat MRA after her last visit in January 2019 showed stable aneurysm and vasculature no change from prior scan.  Neuro exam is normal.The patient is a current patient of Dr. XuErlinda Hong who no longer works in our clinic.  This note is sent to the work in doctor.      Plan: Discussed with Dr. YaKrista Blue continue plavix for stroke prevention, need to stop 5 days prior to surgery and resume after surgery Repeat MRA to evaluate aneurysm in Jan without change - EMG/NCS  showed bil CTS  Follow up with your primary care physician for stroke risk factor modification. Recommend maintain blood pressure goal 130/80, diabetes with hemoglobin A1c goal below 6.5% and lipids with LDL cholesterol goal below 70 mg/dL.   healthy diet and regular exercise -No follow up planned NaDennie BibleGNOrthopedic Specialty Hospital Of NevadaBCHigh Point Surgery Center LLCAPRN  [facility] [address]SuThompsonvillerRainbow Lakes EstatesNC [phone]

## 2017-11-24 ENCOUNTER — Ambulatory Visit (INDEPENDENT_AMBULATORY_CARE_PROVIDER_SITE_OTHER): Payer: Medicare Other | Admitting: Nurse Practitioner

## 2017-11-24 ENCOUNTER — Encounter: Payer: Self-pay | Admitting: Nurse Practitioner

## 2017-11-24 VITALS — BP 120/69 | HR 58 | Ht 61.0 in | Wt 183.6 lb

## 2017-11-24 DIAGNOSIS — G5603 Carpal tunnel syndrome, bilateral upper limbs: Secondary | ICD-10-CM

## 2017-11-24 DIAGNOSIS — G459 Transient cerebral ischemic attack, unspecified: Secondary | ICD-10-CM | POA: Diagnosis not present

## 2017-11-24 DIAGNOSIS — I10 Essential (primary) hypertension: Secondary | ICD-10-CM

## 2017-11-24 NOTE — Patient Instructions (Signed)
continue plavix for stroke prevention, need to stop 5 days prior to surgery and resume after surgery Repeat MRA to evaluate aneurysm in Jan without change - EMG/NCS  showed bil CTS  Follow up with your primary care physician for stroke risk factor modification. Recommend maintain blood pressure goal 130/80, diabetes with hemoglobin A1c goal below 6.5% and lipids with LDL cholesterol goal below 70 mg/dL.   healthy diet and regular exercise -No follow up planned

## 2017-11-24 NOTE — Progress Notes (Signed)
Fax confirmation received [employer] [phone].  [facility]v [phone].  Clearance for L knee surgery.

## 2017-11-29 MED FILL — FUROSEMIDE 20 MG TAB: 20 | 30 days supply | Qty: 30 | Fill #2

## 2017-11-29 NOTE — Progress Notes (Signed)
I have reviewed and agreed above plan. 

## 2017-11-30 ENCOUNTER — Ambulatory Visit (HOSPITAL_COMMUNITY): Payer: Medicare Other | Admitting: Physician Assistant

## 2017-11-30 ENCOUNTER — Other Ambulatory Visit: Payer: Self-pay

## 2017-11-30 ENCOUNTER — Observation Stay (HOSPITAL_COMMUNITY)
Admission: AD | Admit: 2017-11-30 | Discharge: 2017-12-02 | Disposition: A | Discharge status: Home / Self Care | Payer: Medicare Other | Source: Ambulatory Visit | Attending: Orthopaedic Surgery | Admitting: Orthopaedic Surgery

## 2017-11-30 ENCOUNTER — Inpatient Hospital Stay (HOSPITAL_COMMUNITY): Payer: Medicare Other

## 2017-11-30 ENCOUNTER — Ambulatory Visit (HOSPITAL_COMMUNITY): Payer: Medicare Other | Admitting: Anesthesiology

## 2017-11-30 ENCOUNTER — Encounter (HOSPITAL_COMMUNITY)
Admission: AD | Disposition: A | Discharge status: Home / Self Care | Payer: Self-pay | Source: Ambulatory Visit | Attending: Orthopaedic Surgery

## 2017-11-30 ENCOUNTER — Encounter (HOSPITAL_COMMUNITY): Payer: Self-pay | Admitting: Urology

## 2017-11-30 DIAGNOSIS — Z6833 Body mass index (BMI) 33.0-33.9, adult: Secondary | ICD-10-CM | POA: Insufficient documentation

## 2017-11-30 DIAGNOSIS — I341 Nonrheumatic mitral (valve) prolapse: Secondary | ICD-10-CM | POA: Diagnosis not present

## 2017-11-30 DIAGNOSIS — H8109 Meniere's disease, unspecified ear: Secondary | ICD-10-CM | POA: Diagnosis not present

## 2017-11-30 DIAGNOSIS — Z471 Aftercare following joint replacement surgery: Secondary | ICD-10-CM | POA: Diagnosis not present

## 2017-11-30 DIAGNOSIS — K449 Diaphragmatic hernia without obstruction or gangrene: Secondary | ICD-10-CM | POA: Insufficient documentation

## 2017-11-30 DIAGNOSIS — Z96652 Presence of left artificial knee joint: Secondary | ICD-10-CM | POA: Diagnosis not present

## 2017-11-30 DIAGNOSIS — Z8249 Family history of ischemic heart disease and other diseases of the circulatory system: Secondary | ICD-10-CM | POA: Insufficient documentation

## 2017-11-30 DIAGNOSIS — I110 Hypertensive heart disease with heart failure: Secondary | ICD-10-CM | POA: Insufficient documentation

## 2017-11-30 DIAGNOSIS — Z09 Encounter for follow-up examination after completed treatment for conditions other than malignant neoplasm: Secondary | ICD-10-CM

## 2017-11-30 DIAGNOSIS — Z7984 Long term (current) use of oral hypoglycemic drugs: Secondary | ICD-10-CM | POA: Insufficient documentation

## 2017-11-30 DIAGNOSIS — Z87892 Personal history of anaphylaxis: Secondary | ICD-10-CM | POA: Diagnosis not present

## 2017-11-30 DIAGNOSIS — K219 Gastro-esophageal reflux disease without esophagitis: Secondary | ICD-10-CM | POA: Diagnosis not present

## 2017-11-30 DIAGNOSIS — Z79899 Other long term (current) drug therapy: Secondary | ICD-10-CM | POA: Insufficient documentation

## 2017-11-30 DIAGNOSIS — J45909 Unspecified asthma, uncomplicated: Secondary | ICD-10-CM | POA: Diagnosis not present

## 2017-11-30 DIAGNOSIS — Z803 Family history of malignant neoplasm of breast: Secondary | ICD-10-CM | POA: Insufficient documentation

## 2017-11-30 DIAGNOSIS — E1159 Type 2 diabetes mellitus with other circulatory complications: Secondary | ICD-10-CM | POA: Insufficient documentation

## 2017-11-30 DIAGNOSIS — M1712 Unilateral primary osteoarthritis, left knee: Secondary | ICD-10-CM | POA: Diagnosis not present

## 2017-11-30 DIAGNOSIS — Z885 Allergy status to narcotic agent status: Secondary | ICD-10-CM | POA: Diagnosis not present

## 2017-11-30 DIAGNOSIS — Z886 Allergy status to analgesic agent status: Secondary | ICD-10-CM | POA: Insufficient documentation

## 2017-11-30 DIAGNOSIS — Z96651 Presence of right artificial knee joint: Secondary | ICD-10-CM | POA: Insufficient documentation

## 2017-11-30 DIAGNOSIS — I509 Heart failure, unspecified: Secondary | ICD-10-CM | POA: Diagnosis not present

## 2017-11-30 DIAGNOSIS — Z800 Family history of malignant neoplasm of digestive organs: Secondary | ICD-10-CM | POA: Diagnosis not present

## 2017-11-30 DIAGNOSIS — Z8673 Personal history of transient ischemic attack (TIA), and cerebral infarction without residual deficits: Secondary | ICD-10-CM | POA: Diagnosis not present

## 2017-11-30 DIAGNOSIS — Z807 Family history of other malignant neoplasms of lymphoid, hematopoietic and related tissues: Secondary | ICD-10-CM | POA: Diagnosis not present

## 2017-11-30 DIAGNOSIS — Z888 Allergy status to other drugs, medicaments and biological substances status: Secondary | ICD-10-CM | POA: Diagnosis not present

## 2017-11-30 DIAGNOSIS — G8918 Other acute postprocedural pain: Secondary | ICD-10-CM | POA: Diagnosis not present

## 2017-11-30 DIAGNOSIS — E669 Obesity, unspecified: Secondary | ICD-10-CM | POA: Diagnosis not present

## 2017-11-30 DIAGNOSIS — I10 Essential (primary) hypertension: Secondary | ICD-10-CM | POA: Diagnosis not present

## 2017-11-30 HISTORY — PX: TOTAL KNEE ARTHROPLASTY: SHX125

## 2017-11-30 LAB — GLUCOSE, CAPILLARY
Glucose-Capillary: 85 mg/dL (ref 70–99)
Glucose-Capillary: 101 mg/dL — ABNORMAL HIGH (ref 70–99)
Glucose-Capillary: 146 mg/dL — ABNORMAL HIGH (ref 70–99)
Glucose-Capillary: 216 mg/dL — ABNORMAL HIGH (ref 70–99)

## 2017-11-30 SURGERY — ARTHROPLASTY, KNEE, TOTAL
Anesthesia: Regional | Site: Knee | Laterality: Left

## 2017-11-30 MED ORDER — 0.9 % SODIUM CHLORIDE (POUR BTL) OPTIME
TOPICAL | Status: DC | PRN
  Administered 2017-11-30: 1000 mL

## 2017-11-30 MED ORDER — ROCURONIUM BROMIDE 50 MG/5ML IV SOSY
PREFILLED_SYRINGE | INTRAVENOUS | Status: AC
  Filled 2017-11-30: qty 10

## 2017-11-30 MED ORDER — ROCURONIUM BROMIDE 50 MG/5ML IV SOSY
PREFILLED_SYRINGE | INTRAVENOUS | Status: DC | PRN
  Administered 2017-11-30: 40 mg via INTRAVENOUS
  Administered 2017-11-30: 10 mg via INTRAVENOUS

## 2017-11-30 MED ORDER — DOCUSATE SODIUM 100 MG PO CAPS
100.0000 mg | ORAL_CAPSULE | Freq: Two times a day (BID) | ORAL | Status: DC
  Administered 2017-11-30 – 2017-12-02 (×4): 100 mg via ORAL
  Filled 2017-11-30 (×4): qty 1

## 2017-11-30 MED ORDER — PROPOFOL 1000 MG/100ML IV EMUL
INTRAVENOUS | Status: AC
  Filled 2017-11-30: qty 100

## 2017-11-30 MED ORDER — DEXAMETHASONE SODIUM PHOSPHATE 10 MG/ML IJ SOLN
INTRAMUSCULAR | Status: AC
  Filled 2017-11-30: qty 1

## 2017-11-30 MED ORDER — SUCCINYLCHOLINE CHLORIDE 200 MG/10ML IV SOSY
PREFILLED_SYRINGE | INTRAVENOUS | Status: AC
  Filled 2017-11-30: qty 10

## 2017-11-30 MED ORDER — MIDAZOLAM HCL 2 MG/2ML IJ SOLN
INTRAMUSCULAR | Status: AC
  Administered 2017-11-30: 2 mg via INTRAVENOUS
  Filled 2017-11-30: qty 2

## 2017-11-30 MED ORDER — ONDANSETRON HCL 4 MG/2ML IJ SOLN: 4.0000 mg | Freq: Four times a day (QID) | INTRAMUSCULAR | Status: DC | PRN

## 2017-11-30 MED ORDER — CEFAZOLIN SODIUM-DEXTROSE 2-4 GM/100ML-% IV SOLN
2.0000 g | INTRAVENOUS | Status: AC
  Administered 2017-11-30: 2 g via INTRAVENOUS
  Filled 2017-11-30: qty 100

## 2017-11-30 MED ORDER — HYDROMORPHONE HCL 1 MG/ML IJ SOLN
0.5000 mg | INTRAMUSCULAR | Status: DC | PRN
  Administered 2017-12-01 – 2017-12-02 (×4): 0.5 mg via INTRAVENOUS
  Filled 2017-11-30 (×4): qty 1

## 2017-11-30 MED ORDER — METOCLOPRAMIDE HCL 5 MG PO TABS: 5.0000 mg | ORAL_TABLET | Freq: Three times a day (TID) | ORAL | Status: DC | PRN

## 2017-11-30 MED ORDER — METHOCARBAMOL 500 MG PO TABS
ORAL_TABLET | ORAL | Status: AC
  Administered 2017-11-30: 500 mg via ORAL
  Filled 2017-11-30: qty 1

## 2017-11-30 MED ORDER — LIDOCAINE 2% (20 MG/ML) 5 ML SYRINGE
INTRAMUSCULAR | Status: AC
  Filled 2017-11-30: qty 10

## 2017-11-30 MED ORDER — BUPIVACAINE LIPOSOME 1.3 % IJ SUSP
20.0000 mL | INTRAMUSCULAR | Status: DC
  Filled 2017-11-30: qty 20

## 2017-11-30 MED ORDER — FENTANYL CITRATE (PF) 100 MCG/2ML IJ SOLN
50.0000 ug | Freq: Once | INTRAMUSCULAR | Status: AC
  Administered 2017-11-30: 50 ug via INTRAVENOUS

## 2017-11-30 MED ORDER — FENTANYL CITRATE (PF) 100 MCG/2ML IJ SOLN
INTRAMUSCULAR | Status: AC
  Administered 2017-11-30: 25 ug via INTRAVENOUS
  Filled 2017-11-30: qty 2

## 2017-11-30 MED ORDER — MIDAZOLAM HCL 2 MG/2ML IJ SOLN
2.0000 mg | Freq: Once | INTRAMUSCULAR | Status: AC
  Administered 2017-11-30: 2 mg via INTRAVENOUS

## 2017-11-30 MED ORDER — BUPIVACAINE HCL (PF) 0.25 % IJ SOLN
INTRAMUSCULAR | Status: AC
  Filled 2017-11-30: qty 30

## 2017-11-30 MED ORDER — MIDAZOLAM HCL 2 MG/2ML IJ SOLN
INTRAMUSCULAR | Status: AC
  Filled 2017-11-30: qty 2

## 2017-11-30 MED ORDER — BUPIVACAINE HCL (PF) 0.25 % IJ SOLN
INTRAMUSCULAR | Status: DC | PRN
  Administered 2017-11-30: 20 mL

## 2017-11-30 MED ORDER — OXYCODONE-ACETAMINOPHEN 5-325 MG PO TABS
1.0000 | ORAL_TABLET | Freq: Four times a day (QID) | ORAL | Status: DC | PRN
  Administered 2017-11-30 – 2017-12-02 (×7): 1 via ORAL
  Filled 2017-11-30 (×9): qty 1

## 2017-11-30 MED ORDER — DEXAMETHASONE SODIUM PHOSPHATE 10 MG/ML IJ SOLN
INTRAMUSCULAR | Status: DC | PRN
  Administered 2017-11-30: 4 mg via INTRAVENOUS

## 2017-11-30 MED ORDER — ROPIVACAINE HCL 7.5 MG/ML IJ SOLN
INTRAMUSCULAR | Status: DC | PRN
  Administered 2017-11-30: 20 mL via PERINEURAL

## 2017-11-30 MED ORDER — PROPOFOL 10 MG/ML IV BOLUS
INTRAVENOUS | Status: AC
  Filled 2017-11-30: qty 20

## 2017-11-30 MED ORDER — METHOCARBAMOL 1000 MG/10ML IJ SOLN
500.0000 mg | Freq: Four times a day (QID) | INTRAVENOUS | Status: DC | PRN
  Filled 2017-11-30: qty 5

## 2017-11-30 MED ORDER — METOCLOPRAMIDE HCL 5 MG/ML IJ SOLN: 5.0000 mg | Freq: Three times a day (TID) | INTRAMUSCULAR | Status: DC | PRN

## 2017-11-30 MED ORDER — SODIUM CHLORIDE 0.9 % IV SOLN
INTRAVENOUS | Status: DC
  Administered 2017-11-30 – 2017-12-01 (×2): via INTRAVENOUS

## 2017-11-30 MED ORDER — SUGAMMADEX SODIUM 200 MG/2ML IV SOLN
INTRAVENOUS | Status: DC | PRN
  Administered 2017-11-30: 100 mg via INTRAVENOUS
  Administered 2017-11-30 (×2): 50 mg via INTRAVENOUS

## 2017-11-30 MED ORDER — FENTANYL CITRATE (PF) 100 MCG/2ML IJ SOLN
25.0000 ug | INTRAMUSCULAR | Status: DC | PRN
  Administered 2017-11-30: 50 ug via INTRAVENOUS
  Administered 2017-11-30 (×2): 25 ug via INTRAVENOUS

## 2017-11-30 MED ORDER — SENNOSIDES-DOCUSATE SODIUM 8.6-50 MG PO TABS: 1.0000 | ORAL_TABLET | Freq: Every evening | ORAL | Status: DC | PRN

## 2017-11-30 MED ORDER — OXYCODONE-ACETAMINOPHEN 5-325 MG PO TABS
ORAL_TABLET | ORAL | Status: AC
  Filled 2017-11-30: qty 1

## 2017-11-30 MED ORDER — ENSURE ENLIVE PO LIQD
237.0000 mL | Freq: Two times a day (BID) | ORAL | Status: DC
  Administered 2017-12-01 – 2017-12-02 (×4): 237 mL via ORAL

## 2017-11-30 MED ORDER — ONDANSETRON HCL 4 MG PO TABS: 4.0000 mg | ORAL_TABLET | Freq: Four times a day (QID) | ORAL | Status: DC | PRN

## 2017-11-30 MED ORDER — METHOCARBAMOL 500 MG PO TABS
500.0000 mg | ORAL_TABLET | Freq: Four times a day (QID) | ORAL | Status: DC | PRN
  Administered 2017-11-30 – 2017-12-02 (×7): 500 mg via ORAL
  Filled 2017-11-30 (×7): qty 1

## 2017-11-30 MED ORDER — PROPOFOL 10 MG/ML IV BOLUS
INTRAVENOUS | Status: DC | PRN
  Administered 2017-11-30: 130 mg via INTRAVENOUS

## 2017-11-30 MED ORDER — LACTATED RINGERS IV SOLN
INTRAVENOUS | Status: DC
  Administered 2017-11-30 (×2): via INTRAVENOUS

## 2017-11-30 MED ORDER — FENTANYL CITRATE (PF) 100 MCG/2ML IJ SOLN
INTRAMUSCULAR | Status: DC | PRN
  Administered 2017-11-30 (×2): 50 ug via INTRAVENOUS
  Administered 2017-11-30: 100 ug via INTRAVENOUS

## 2017-11-30 MED ORDER — PHENOL 1.4 % MT LIQD: 1.0000 | OROMUCOSAL | Status: DC | PRN

## 2017-11-30 MED ORDER — CEFAZOLIN SODIUM-DEXTROSE 1-4 GM/50ML-% IV SOLN
1.0000 g | Freq: Three times a day (TID) | INTRAVENOUS | Status: AC
  Administered 2017-11-30 – 2017-12-01 (×2): 1 g via INTRAVENOUS
  Filled 2017-11-30 (×2): qty 50

## 2017-11-30 MED ORDER — CHLORHEXIDINE GLUCONATE 4 % EX LIQD: 60.0000 mL | Freq: Once | CUTANEOUS | Status: DC

## 2017-11-30 MED ORDER — FENTANYL CITRATE (PF) 250 MCG/5ML IJ SOLN
INTRAMUSCULAR | Status: AC
  Filled 2017-11-30: qty 5

## 2017-11-30 MED ORDER — SODIUM CHLORIDE 0.9 % IR SOLN
Status: DC | PRN
  Administered 2017-11-30: 3000 mL

## 2017-11-30 MED ORDER — SODIUM CHLORIDE 0.9 % IJ SOLN
INTRAMUSCULAR | Status: AC
  Filled 2017-11-30: qty 10

## 2017-11-30 MED ORDER — MENTHOL 3 MG MT LOZG: 1.0000 | LOZENGE | OROMUCOSAL | Status: DC | PRN

## 2017-11-30 MED ORDER — FENTANYL CITRATE (PF) 100 MCG/2ML IJ SOLN
INTRAMUSCULAR | Status: AC
  Administered 2017-11-30: 50 ug via INTRAVENOUS
  Filled 2017-11-30: qty 2

## 2017-11-30 MED ORDER — INSULIN ASPART 100 UNIT/ML ~~LOC~~ SOLN
0.0000 [IU] | Freq: Three times a day (TID) | SUBCUTANEOUS | Status: DC
  Administered 2017-12-01: 2 [IU] via SUBCUTANEOUS
  Administered 2017-12-01: 5 [IU] via SUBCUTANEOUS
  Administered 2017-12-01: 3 [IU] via SUBCUTANEOUS
  Administered 2017-12-02: 2 [IU] via SUBCUTANEOUS
  Administered 2017-12-02: 3 [IU] via SUBCUTANEOUS
  Administered 2017-12-02: 2 [IU] via SUBCUTANEOUS

## 2017-11-30 MED ORDER — LIDOCAINE 2% (20 MG/ML) 5 ML SYRINGE
INTRAMUSCULAR | Status: DC | PRN
  Administered 2017-11-30: 60 mg via INTRAVENOUS

## 2017-11-30 MED ORDER — ONDANSETRON HCL 4 MG/2ML IJ SOLN
INTRAMUSCULAR | Status: DC | PRN
  Administered 2017-11-30: 4 mg via INTRAVENOUS

## 2017-11-30 MED ORDER — BUPIVACAINE LIPOSOME 1.3 % IJ SUSP
INTRAMUSCULAR | Status: DC | PRN
  Administered 2017-11-30: 20 mL

## 2017-11-30 MED ORDER — ONDANSETRON HCL 4 MG/2ML IJ SOLN
INTRAMUSCULAR | Status: AC
  Filled 2017-11-30: qty 2

## 2017-11-30 SURGICAL SUPPLY — 76 items
APL SKNCLS STERI-STRIP NONHPOA (GAUZE/BANDAGES/DRESSINGS)
ATTUNE PS FEM LT SZ 3 CEM KNEE (Femur) ×1 IMPLANT
ATTUNE PSRP INSR SZ3 5 KNEE (Insert) ×2 IMPLANT
BANDAGE ACE 4X5 VEL STRL LF (GAUZE/BANDAGES/DRESSINGS) ×2 IMPLANT
BANDAGE ESMARK 6X9 LF (GAUZE/BANDAGES/DRESSINGS) ×1 IMPLANT
BASE TIBIAL ROT PLAT SZ 3 KNEE (Knees) IMPLANT
BENZOIN TINCTURE PRP APPL 2/3 (GAUZE/BANDAGES/DRESSINGS) IMPLANT
BLADE SAGITTAL 25.0X1.19X90 (BLADE) ×2 IMPLANT
BLADE SAW SGTL 13X75X1.27 (BLADE) ×2 IMPLANT
BNDG CMPR 9X6 STRL LF SNTH (GAUZE/BANDAGES/DRESSINGS) ×1
BNDG CMPR MED 10X6 ELC LF (GAUZE/BANDAGES/DRESSINGS) ×1
BNDG ELASTIC 6X10 VLCR STRL LF (GAUZE/BANDAGES/DRESSINGS) ×2 IMPLANT
BNDG ESMARK 6X9 LF (GAUZE/BANDAGES/DRESSINGS) ×2
BOWL SMART MIX CTS (DISPOSABLE) ×2 IMPLANT
BSPLAT TIB 3 CMNT ROT PLAT STR (Knees) ×1 IMPLANT
CEMENT HV SMART SET (Cement) ×4 IMPLANT
COVER SURGICAL LIGHT HANDLE (MISCELLANEOUS) ×2 IMPLANT
COVER WAND RF STERILE (DRAPES) ×2 IMPLANT
CUFF TOURNIQUET SINGLE 34IN LL (TOURNIQUET CUFF) ×2 IMPLANT
CUFF TOURNIQUET SINGLE 44IN (TOURNIQUET CUFF) IMPLANT
DRAPE ORTHO SPLIT 77X108 STRL (DRAPES) ×6
DRAPE SURG ORHT 6 SPLT 77X108 (DRAPES) ×2 IMPLANT
DRAPE U-SHAPE 47X51 STRL (DRAPES) ×2 IMPLANT
DRSG AQUACEL AG ADV 3.5X14 (GAUZE/BANDAGES/DRESSINGS) ×1 IMPLANT
DRSG PAD ABDOMINAL 8X10 ST (GAUZE/BANDAGES/DRESSINGS) ×1 IMPLANT
DURAPREP 26ML APPLICATOR (WOUND CARE) ×4 IMPLANT
ELECT REM PT RETURN 9FT ADLT (ELECTROSURGICAL) ×2
ELECTRODE REM PT RTRN 9FT ADLT (ELECTROSURGICAL) ×1 IMPLANT
EVACUATOR 1/8 PVC DRAIN (DRAIN) IMPLANT
FACESHIELD WRAPAROUND (MASK) ×4 IMPLANT
GAUZE SPONGE 4X4 12PLY STRL (GAUZE/BANDAGES/DRESSINGS) ×1 IMPLANT
GAUZE XEROFORM 5X9 LF (GAUZE/BANDAGES/DRESSINGS) ×1 IMPLANT
GLOVE BIOGEL PI IND STRL 8 (GLOVE) ×2 IMPLANT
GLOVE BIOGEL PI INDICATOR 8 (GLOVE) ×2
GLOVE ORTHO TXT STRL SZ7.5 (GLOVE) ×4 IMPLANT
GOWN STRL REUS W/ TWL LRG LVL3 (GOWN DISPOSABLE) ×1 IMPLANT
GOWN STRL REUS W/ TWL XL LVL3 (GOWN DISPOSABLE) ×1 IMPLANT
GOWN STRL REUS W/TWL 2XL LVL3 (GOWN DISPOSABLE) ×2 IMPLANT
GOWN STRL REUS W/TWL LRG LVL3 (GOWN DISPOSABLE) ×2
GOWN STRL REUS W/TWL XL LVL3 (GOWN DISPOSABLE) ×2
HANDPIECE INTERPULSE COAX TIP (DISPOSABLE) ×2
IMMOBILIZER KNEE 22 UNIV (SOFTGOODS) ×2 IMPLANT
KIT BASIN OR (CUSTOM PROCEDURE TRAY) ×2 IMPLANT
KIT TURNOVER KIT B (KITS) ×2 IMPLANT
MANIFOLD NEPTUNE II (INSTRUMENTS) ×2 IMPLANT
MARKER SKIN DUAL TIP RULER LAB (MISCELLANEOUS) ×2 IMPLANT
NDL 18GX1X1/2 (RX/OR ONLY) (NEEDLE) ×1 IMPLANT
NDL HYPO 25GX1X1/2 BEV (NEEDLE) ×1 IMPLANT
NEEDLE 18GX1X1/2 (RX/OR ONLY) (NEEDLE) ×2 IMPLANT
NEEDLE HYPO 25GX1X1/2 BEV (NEEDLE) ×2 IMPLANT
NS IRRIG 1000ML POUR BTL (IV SOLUTION) ×2 IMPLANT
PACK TOTAL JOINT (CUSTOM PROCEDURE TRAY) ×2 IMPLANT
PAD ARMBOARD 7.5X6 YLW CONV (MISCELLANEOUS) ×3 IMPLANT
PAD CAST 4YDX4 CTTN HI CHSV (CAST SUPPLIES) IMPLANT
PADDING CAST COTTON 4X4 STRL (CAST SUPPLIES)
PADDING CAST COTTON 6X4 STRL (CAST SUPPLIES) ×2 IMPLANT
PATELLA MEDIAL ATTUN 35MM KNEE (Knees) ×1 IMPLANT
PIN STEINMAN FIXATION KNEE (PIN) ×1 IMPLANT
SET HNDPC FAN SPRY TIP SCT (DISPOSABLE) ×1 IMPLANT
STAPLER VISISTAT 35W (STAPLE) ×1 IMPLANT
STRIP CLOSURE SKIN 1/2X4 (GAUZE/BANDAGES/DRESSINGS) ×2 IMPLANT
SUCTION FRAZIER HANDLE 10FR (MISCELLANEOUS) ×1
SUCTION TUBE FRAZIER 10FR DISP (MISCELLANEOUS) ×1 IMPLANT
SUT VIC AB 0 CT1 27 (SUTURE) ×2
SUT VIC AB 0 CT1 27XBRD ANBCTR (SUTURE) ×1 IMPLANT
SUT VIC AB 1 CTX 36 (SUTURE) ×4
SUT VIC AB 1 CTX36XBRD ANBCTR (SUTURE) ×2 IMPLANT
SUT VIC AB 2-0 CT1 27 (SUTURE) ×4
SUT VIC AB 2-0 CT1 TAPERPNT 27 (SUTURE) ×2 IMPLANT
SUT VIC AB 3-0 X1 27 (SUTURE) ×2 IMPLANT
SYR 50ML LL SCALE MARK (SYRINGE) ×2 IMPLANT
SYR CONTROL 10ML LL (SYRINGE) ×2 IMPLANT
TIBIAL BASE ROT PLAT SZ 3 KNEE (Knees) ×2 IMPLANT
TOWEL OR 17X24 6PK STRL BLUE (TOWEL DISPOSABLE) ×2 IMPLANT
TOWEL OR 17X26 10 PK STRL BLUE (TOWEL DISPOSABLE) ×2 IMPLANT
TRAY CATH 16FR W/PLASTIC CATH (SET/KITS/TRAYS/PACK) IMPLANT

## 2017-11-30 NOTE — H&P (Signed)
TOTAL KNEE ADMISSION H&P  Patient is being admitted for left total knee arthroplasty.  Subjective:  Chief Complaint:left knee pain.  HPI: Sherry [name], 67 y.o. female, has a history of pain and functional disability in the left knee due to arthritis and has failed non-surgical conservative treatments for greater than 12 weeks to includeNSAID's and/or analgesics, corticosteriod injections, use of assistive devices and activity modification.  Onset of symptoms was gradual, starting 10 years ago with gradually worsening course since that time.   Patient currently rates pain in the left knee(s) at 10 out of 10 with activity. Patient has night pain, worsening of pain with activity and weight bearing, pain that interferes with activities of daily living, pain with passive range of motion, crepitus and joint swelling.  Patient has evidence of subchondral cysts, subchondral sclerosis, periarticular osteophytes and joint space narrowing by imaging studies.  There is no active infection.  Patient Active Problem List   Diagnosis Date Noted  . S/P total knee replacement, right 03/29/2017  . Spondylosis without myelopathy or radiculopathy, cervical region 03/29/2017  . Unilateral primary osteoarthritis, left knee 03/29/2017  . Bilateral carpal tunnel syndrome 03/29/2017  . Acute asthma exacerbation 08/19/2016  . Preoperative cardiovascular examination 08/16/2016  . Cervicalgia 03/23/2016  . Acute back pain with sciatica, right 01/14/2016  . Vertigo 04/14/2015  . Anxiety state 01/22/2015  . HLD (hyperlipidemia) 01/22/2015  . Type 2 diabetes mellitus with circulatory disorder ([place]) 01/22/2015  . Intracranial vascular stenosis 01/22/2015  . Aneurysm ([place])   . Headache   . Diabetes mellitus with complication ([place]) 16/08/3708  . Essential hypertension 11/12/2014  . Dependent edema 11/12/2014  . Peripheral neuropathy 11/12/2014  . Depression 11/12/2014  . Facial droop   . Chest pain   . TIA  (transient ischemic attack) 11/11/2014  . Dyspnea 08/06/2014  . Hyperthyroidism 08/06/2014  . Meniere's disease 06/07/2014  . History of CHF (congestive heart failure) 06/07/2014  . Essential hypertension, benign 06/13/2013  . Goiter 06/13/2013  . Obesity, unspecified 06/13/2013   Past Medical History:  Diagnosis Date  . Asthma   . Cerebral aneurysm   . Complication of anesthesia HYPOTENSION INTRAOP W/ HYSTERECTOMY IN 1987--  DID OK W/ TOTAL KNEE IN 2008  . Diabetes mellitus   . DJD (degenerative joint disease)   . Dyslipidemia   . GERD (gastroesophageal reflux disease)   . H/O hiatal hernia   . History of CHF (congestive heart failure) 2010-  RESOLVED  . History of peptic ulcer YRS AGO  . Hypertension   . Meniere's disease   . Mitral valve prolapse occasional palpitations w/ chest discomfort  . OA (osteoarthritis)   . Rotator cuff tear, left   . Stroke [facility])     Past Surgical History:  Procedure Laterality Date  . BUNIONECTOMY  2007   LEFT FOOT  . LEFT SHOULDER SURGERY  1997   ROTATOR CUFF REPAIR  . SHOULDER ARTHROSCOPY  09/20/2011   Procedure: ARTHROSCOPY SHOULDER;  Surgeon: Johnn Hai, MD;  Location: [facility];  Service: Orthopedics;  Laterality: Left;  SAD AND MINI OPEN ROTATOR CUFF REPAIR    . TOTAL KNEE ARTHROPLASTY  09-12-2006  [facility]   RIGHT KNEE  . TUBAL LIGATION  1976  . TYMPANOPLASTY  1990;   1980  . VAGINAL HYSTERECTOMY  1987    Current Facility-Administered Medications  Medication Dose Route Frequency Provider Last Rate Last Dose  . ceFAZolin (ANCEF) IVPB 2g/100 mL premix  2 g Intravenous On Call to  OR Benjiman Core M, PA-C      . chlorhexidine (HIBICLENS) 4 % liquid 4 application  60 mL Topical Once Benjiman Core M, PA-C      . lactated ringers infusion   Intravenous Continuous Freddrick March, MD 10 mL/hr at 11/30/17 1211     Allergies  Allergen Reactions  . Influenza Vaccines Anaphylaxis  . Demerol Other (See Comments)     SEIZURE  . Nsaids Other (See Comments)    NAPROXEN, IBUPROFEN, SKELAXIN---  CAUSES PALPITATIONS  . Prednisone Other (See Comments)    Caused her glucose to go up to 500 and went to ED  . Tramadol Other (See Comments)    Contraindicated with Victoza   . Codeine Itching  . Mango Flavor [Flavoring Agent] Itching    Social History   Tobacco Use  . Smoking status: Never Smoker  . Smokeless tobacco: Never Used  Substance Use Topics  . Alcohol use: No    Family History  Problem Relation Age of Onset  . Heart disease Mother        in her 21s, heart attack  . Diabetes Mother   . Kidney disease Mother        dialysis  . Thyroid disease Mother   . Aneurysm Mother   . Ovarian cancer Mother   . Breast cancer Mother   . Thyroid disease Sister   . Heart disease Sister   . Lymphoma Father   . Diabetes Father   . Hypertension Father   . Congestive Heart Failure Father   . Thyroid disease Brother   . Thyroid disease Paternal Grandmother   . Heart attack Sister        at age 55  . Stroke Sister   . Hypertension Sister   . Breast cancer Maternal Aunt   . Breast cancer Maternal Grandmother   . Breast cancer Maternal Aunt   . Breast cancer Maternal Aunt   . Other Brother        Musician accident  . Bipolar disorder Son   . Other Son        GSW     Review of Systems  Constitutional: Negative.   HENT: Negative.   Respiratory: Negative.   Cardiovascular: Negative.   Gastrointestinal: Negative.   Genitourinary: Negative.   Musculoskeletal: Positive for joint pain.  Skin: Negative.   Neurological: Negative.   Psychiatric/Behavioral: Negative.     Objective:  Physical Exam  Constitutional: She is oriented to person, place, and time. No distress.  HENT:  Head: Normocephalic and atraumatic.  Eyes: Pupils are equal, round, and reactive to light.  Neck: Normal range of motion.  Cardiovascular: Normal heart sounds.  Respiratory: No respiratory distress.  GI: She exhibits no  distension.  Musculoskeletal: She exhibits tenderness.  Neurological: She is alert and oriented to person, place, and time.  Skin: Skin is warm and dry.  Psychiatric: She has a normal mood and affect.    Vital signs in last 24 hours: Temp:  [97.7 F (36.5 C)] 97.7 F (36.5 C) (10/23 1041) Pulse Rate:  [58-61] 61 (10/23 1210) Resp:  [8-18] 8 (10/23 1210) BP: (130-153)/(47-67) 149/62 (10/23 1210) SpO2:  [95 %-100 %] 100 % (10/23 1210) Weight:  [81.6 kg] 81.6 kg (10/23 1041)  Labs:   Estimated body mass index is 34.01 kg/m as calculated from the following:   Height as of this encounter: 5\' 1"  (1.549 m).   Weight as of this encounter: 81.6 kg.   Imaging Review  Plain radiographs demonstrate moderate degenerative joint disease of the left knee(s). The overall alignment ismild varus. The bone quality appears to be good for age and reported activity level.   Preoperative templating of the joint replacement has been completed, documented, and submitted to the Operating Room personnel in order to optimize intra-operative equipment management.      Assessment/Plan:  End stage arthritis, left knee   The patient history, physical examination, clinical judgment of the provider and imaging studies are consistent with end stage degenerative joint disease of the left knee(s) and total knee arthroplasty is deemed medically necessary. The treatment options including medical management, injection therapy arthroscopy and arthroplasty were discussed at length. The risks and benefits of total knee arthroplasty were presented and reviewed. The risks due to aseptic loosening, infection, stiffness, patella tracking problems, thromboembolic complications and other imponderables were discussed. The patient acknowledged the explanation, agreed to proceed with the plan and consent was signed. Patient is being admitted for inpatient treatment for surgery, pain control, PT, OT, prophylactic antibiotics, VTE  prophylaxis, progressive ambulation and ADL's and discharge planning. The patient is planning to be discharged home with home health services

## 2017-11-30 NOTE — Progress Notes (Signed)
Per RN from day shift foot pumps were ordered. I verified with Nurse Secretary that they were ordered but we have not received them yet. Pt does have SCDs on currently in the meantime. Will f/u.

## 2017-11-30 NOTE — Plan of Care (Signed)

## 2017-11-30 NOTE — Progress Notes (Signed)
Orthopedic Tech Progress Note Patient Details:  Sherry [name] 06-28-1950 [id number]  CPM Left Knee CPM Left Knee: On Left Knee Flexion (Degrees): 90 Left Knee Extension (Degrees): 0 Additional Comments: trapeze bar patient helper  Post Interventions Patient Tolerated: Well Instructions Provided: Care of device Viewed order from doctor's order list Hildred Priest 11/30/2017, 3:29 PM

## 2017-11-30 NOTE — Anesthesia Preprocedure Evaluation (Addendum)
Anesthesia Evaluation  Patient identified by MRN, date of birth, ID band Patient awake    Reviewed: Allergy & Precautions, NPO status , Patient's Chart, lab work & pertinent test results, reviewed documented beta blocker date and time   Airway Mallampati: II  TM Distance: >3 FB Neck ROM: Full    Dental no notable dental hx. (+) Teeth Intact, Dental Advisory Given   Pulmonary asthma ,    Pulmonary exam normal breath sounds clear to auscultation       Cardiovascular hypertension, Pt. on home beta blockers and Pt. on medications Normal cardiovascular exam+ Valvular Problems/Murmurs MVP  Rhythm:Regular Rate:Normal  EKG: 11/16/2017: NSR rate 70. Nonspecific t wave abnormality.  CV: Myocardial perfusion imaging 09/07/2016: The left ventricular ejection fraction is hyperdynamic (>65%). Nuclear stress EF: 68%. There was no ST segment deviation noted during stress. The study is normal. This is a low risk study. Low risk stress nuclear study with normal perfusion and normal left ventricular regional and global systolic function.  TTE 04/15/2015: Study Conclusions - Left ventricle: The cavity size was normal. Wall thickness was normal. Systolic function was normal. The estimated ejectionfraction was in the range of 60% to 65%. Wall motion was normal; there were no regional wall motion abnormalities.   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    GI/Hepatic Neg liver ROS, hiatal hernia, GERD  Medicated,  Endo/Other  negative endocrine ROSdiabetes, Type 2, Oral Hypoglycemic Agents  Renal/GU negative Renal ROS  negative genitourinary   Musculoskeletal  (+) Arthritis , Osteoarthritis,    Abdominal   Peds  Hematology negative hematology ROS (+)   Anesthesia Other Findings On plavix, last dose Saturday morning  Reproductive/Obstetrics                           Anesthesia Physical Anesthesia Plan  ASA: III  Anesthesia Plan: General and Regional   Post-op Pain Management:  Regional for Post-op pain   Induction: Intravenous  PONV Risk Score and Plan: 3 and Dexamethasone, Ondansetron and Midazolam  Airway Management Planned: Oral ETT  Additional Equipment:   Intra-op Plan:   Post-operative Plan: Extubation in OR  Informed Consent: I have reviewed the patients History and Physical, chart, labs and discussed the procedure including the risks, benefits and alternatives for the proposed anesthesia with the patient or authorized representative who has indicated his/her understanding and acceptance.   Dental advisory given  Plan Discussed with: CRNA  Anesthesia Plan Comments:       Anesthesia Quick Evaluation

## 2017-11-30 NOTE — Anesthesia Procedure Notes (Signed)
Procedure Name: Intubation Date/Time: 11/30/2017 12:49 PM Performed by: Trinna Post., CRNA Pre-anesthesia Checklist: Patient identified, Emergency Drugs available, Suction available, Patient being monitored and Timeout performed Patient Re-evaluated:Patient Re-evaluated prior to induction Oxygen Delivery Method: Circle system utilized Preoxygenation: Pre-oxygenation with 100% oxygen Induction Type: IV induction Ventilation: Mask ventilation without difficulty Laryngoscope Size: Mac and 3 Grade View: Grade I Tube type: Oral Tube size: 7.0 mm Number of attempts: 1 Airway Equipment and Method: Stylet Placement Confirmation: ETT inserted through vocal cords under direct vision,  positive ETCO2 and breath sounds checked- equal and bilateral Secured at: 21 cm Tube secured with: Tape Dental Injury: Teeth and Oropharynx as per pre-operative assessment

## 2017-11-30 NOTE — Anesthesia Procedure Notes (Signed)
Anesthesia Regional Block: Adductor canal block   Pre-Anesthetic Checklist: ,, timeout performed, Correct Patient, Correct Site, Correct Laterality, Correct Procedure, Correct Position, site marked, Risks and benefits discussed,  Surgical consent,  Pre-op evaluation,  At surgeon's request and post-op pain management  Laterality: Left  Prep: Maximum Sterile Barrier Precautions used, chloraprep       Needles:  Injection technique: Single-shot  Needle Type: Echogenic Stimulator Needle     Needle Length: 9cm  Needle Gauge: 22     Additional Needles:   Procedures:,,,, ultrasound used (permanent image in chart),,,,  Narrative:  Start time: 11/30/2017 11:48 AM End time: 11/30/2017 11:58 AM Injection made incrementally with aspirations every 5 mL.  Performed by: Personally  Anesthesiologist: Freddrick March, MD  Additional Notes: Monitors applied. No increased pain on injection. No increased resistance to injection. Injection made in 5cc increments. Good needle visualization. Patient tolerated procedure well.

## 2017-11-30 NOTE — Transfer of Care (Signed)
Immediate Anesthesia Transfer of Care Note  Patient: [name]  Procedure(s) Performed: LEFT TOTAL KNEE REPLACEMENT (Left Knee)  Patient Location: PACU  Anesthesia Type:GA combined with regional for post-op pain  Level of Consciousness: awake, alert  and oriented  Airway & Oxygen Therapy: Patient Spontanous Breathing and Patient connected to face mask oxygen  Post-op Assessment: Report given to RN and Post -op Vital signs reviewed and stable  Post vital signs: Reviewed and stable  Last Vitals:  Vitals Value Taken Time  BP 157/73 11/30/2017  2:31 PM  Temp    Pulse 84 11/30/2017  2:35 PM  Resp 22 11/30/2017  2:35 PM  SpO2 100 % 11/30/2017  2:35 PM  Vitals shown include unvalidated device data.  Last Pain:  Vitals:   11/30/17 1048  TempSrc:   PainSc: 0-No pain         Complications: No apparent anesthesia complications

## 2017-11-30 NOTE — Op Note (Signed)
Preop diagnosis: Left knee primary osteoarthritis  Postop diagnosis: Same  Procedure: Left total knee arthroplasty, cemented.  Surgeon: Rodell Perna MD  Assistant: Benjiman Core, PA-C medically necessary and present for the entire procedure  Anesthesia: Preoperative block plus general anesthesia.  Additional Marcaine and Exparel closure.  Tourniquet time 350 times approximately 45 minutes  Implants:Depuy Attune size 3 femur size 3 tibia with rotating platform 5 mm insert.  35 mm 3 peg patella.  Procedure: After preoperative block induction of anesthesia proximal thigh tourniquet lateral posterior heel bump standard prepping with DuraPrep to the tip the toes was performed usual total knee sheets drapes sterile impervious stockinette Covan sterile skin marker and Betadine Steri-Drape ceiling the skin.  Ancef prophylaxis and timeout procedure completed.  Leg was wrapped in Esmarch tourniquet inflated midline incision was made.  Medial parapatellar incision was made.  Bovie electrocautery was used on vessels that needed cauterization.  Patella was everted 10 mm resected sized and drilled for a 35 mm patella.  There is tricompartmental degenerative changes marginal osteophytes eburnation of bone exposed subchondral bone.  Marginal osteophytes were resected hole was drilled up the femur after resection of ACL PCL and meniscal remnants.  2 mm distal cut on the femur 9 mm resected on the tibia spacer block 5 mm gave full extension good collateral balance.  Chamfer cuts made on the femur box cut and keel preparation for the tibia was sized to a #3 as well.  Trials were inserted good collateral balance full extension.  Pulse lavage drilling of the lug holes in the femur vacuum mixing of the cement was performed.  Tibia was cemented first followed by femur all excessive cement was removed.  Permanent poly-was inserted and then patella was held with a self-retaining clamp.  There was good alignment full extension  cement was hard at 15 minutes the tourniquet was deflated hemostasis obtained in standard layer closure with #1 Vicryl in the deep retinaculum.  2- 0 Vicryl in the subcutaneous tissue.  Skin staple closure.  Prior to closure of tourniquet was deflated Exparel 20 cc of Marcaine 20 cc was infiltrated into the tissue capsule for postoperative analgesia.  Postoperative dressing was applied and patient was transferred to recovery room in stable condition.

## 2017-11-30 NOTE — Interval H&P Note (Signed)
History and Physical Interval Note:  11/30/2017 12:23 PM  Sherry [name]  has presented today for surgery, with the diagnosis of LEFT KNEE DEGENERATIVE JOINT DISEASE  The various methods of treatment have been discussed with the patient and family. After consideration of risks, benefits and other options for treatment, the patient has consented to  Procedure(s): LEFT TOTAL KNEE REPLACEMENT (Left) as a surgical intervention .  The patient's history has been reviewed, patient examined, no change in status, stable for surgery.  I have reviewed the patient's chart and labs.  Questions were answered to the patient's satisfaction.     [name]

## 2017-12-01 ENCOUNTER — Encounter (HOSPITAL_COMMUNITY): Payer: Self-pay | Admitting: Orthopaedic Surgery

## 2017-12-01 DIAGNOSIS — Z79899 Other long term (current) drug therapy: Secondary | ICD-10-CM | POA: Diagnosis not present

## 2017-12-01 DIAGNOSIS — M1712 Unilateral primary osteoarthritis, left knee: Secondary | ICD-10-CM | POA: Diagnosis not present

## 2017-12-01 DIAGNOSIS — Z8673 Personal history of transient ischemic attack (TIA), and cerebral infarction without residual deficits: Secondary | ICD-10-CM | POA: Diagnosis not present

## 2017-12-01 DIAGNOSIS — Z8249 Family history of ischemic heart disease and other diseases of the circulatory system: Secondary | ICD-10-CM | POA: Diagnosis not present

## 2017-12-01 DIAGNOSIS — Z7984 Long term (current) use of oral hypoglycemic drugs: Secondary | ICD-10-CM | POA: Diagnosis not present

## 2017-12-01 DIAGNOSIS — I341 Nonrheumatic mitral (valve) prolapse: Secondary | ICD-10-CM | POA: Diagnosis not present

## 2017-12-01 DIAGNOSIS — I509 Heart failure, unspecified: Secondary | ICD-10-CM | POA: Diagnosis not present

## 2017-12-01 DIAGNOSIS — E1159 Type 2 diabetes mellitus with other circulatory complications: Secondary | ICD-10-CM | POA: Diagnosis not present

## 2017-12-01 DIAGNOSIS — K219 Gastro-esophageal reflux disease without esophagitis: Secondary | ICD-10-CM | POA: Diagnosis not present

## 2017-12-01 DIAGNOSIS — Z807 Family history of other malignant neoplasms of lymphoid, hematopoietic and related tissues: Secondary | ICD-10-CM | POA: Diagnosis not present

## 2017-12-01 DIAGNOSIS — Z803 Family history of malignant neoplasm of breast: Secondary | ICD-10-CM | POA: Diagnosis not present

## 2017-12-01 DIAGNOSIS — Z888 Allergy status to other drugs, medicaments and biological substances status: Secondary | ICD-10-CM | POA: Diagnosis not present

## 2017-12-01 DIAGNOSIS — K449 Diaphragmatic hernia without obstruction or gangrene: Secondary | ICD-10-CM | POA: Diagnosis not present

## 2017-12-01 DIAGNOSIS — Z96651 Presence of right artificial knee joint: Secondary | ICD-10-CM | POA: Diagnosis not present

## 2017-12-01 DIAGNOSIS — Z886 Allergy status to analgesic agent status: Secondary | ICD-10-CM | POA: Diagnosis not present

## 2017-12-01 DIAGNOSIS — Z800 Family history of malignant neoplasm of digestive organs: Secondary | ICD-10-CM | POA: Diagnosis not present

## 2017-12-01 DIAGNOSIS — H8109 Meniere's disease, unspecified ear: Secondary | ICD-10-CM | POA: Diagnosis not present

## 2017-12-01 DIAGNOSIS — Z885 Allergy status to narcotic agent status: Secondary | ICD-10-CM | POA: Diagnosis not present

## 2017-12-01 DIAGNOSIS — I110 Hypertensive heart disease with heart failure: Secondary | ICD-10-CM | POA: Diagnosis not present

## 2017-12-01 DIAGNOSIS — J45909 Unspecified asthma, uncomplicated: Secondary | ICD-10-CM | POA: Diagnosis not present

## 2017-12-01 DIAGNOSIS — Z87892 Personal history of anaphylaxis: Secondary | ICD-10-CM | POA: Diagnosis not present

## 2017-12-01 LAB — BASIC METABOLIC PANEL
Anion gap: 8 (ref 5–15)
BUN: 16 mg/dL (ref 8–23)
CO2: 24 mmol/L (ref 22–32)
Calcium: 8.2 mg/dL — ABNORMAL LOW (ref 8.9–10.3)
Chloride: 109 mmol/L (ref 98–111)
Creatinine, Ser: 0.64 mg/dL (ref 0.44–1.00)
GFR calc Af Amer: >60 mL/min (ref >60)
GFR calc non Af Amer: >60 mL/min (ref >60)
Glucose, Bld: 190 mg/dL — ABNORMAL HIGH (ref 70–99)
Potassium: 3.4 mmol/L — ABNORMAL LOW (ref 3.5–5.1)
Sodium: 141 mmol/L (ref 135–145)

## 2017-12-01 LAB — GLUCOSE, CAPILLARY
Glucose-Capillary: 157 mg/dL — ABNORMAL HIGH (ref 70–99)
Glucose-Capillary: 221 mg/dL — ABNORMAL HIGH (ref 70–99)
Glucose-Capillary: 149 mg/dL — ABNORMAL HIGH (ref 70–99)
Glucose-Capillary: 131 mg/dL — ABNORMAL HIGH (ref 70–99)

## 2017-12-01 LAB — CBC
HCT: 33 % — ABNORMAL LOW (ref 36.0–46.0)
Hemoglobin: 10.4 g/dL — ABNORMAL LOW (ref 12.0–15.0)
MCH: 28.7 pg (ref 26.0–34.0)
MCHC: 31.5 g/dL (ref 30.0–36.0)
MCV: 90.9 fL (ref 80.0–100.0)
Platelets: 256 10*3/uL (ref 150–400)
RBC: 3.63 MIL/uL — ABNORMAL LOW (ref 3.87–5.11)
RDW: 13.9 % (ref 11.5–15.5)
WBC: 10.1 10*3/uL (ref 4.0–10.5)
nRBC: 0 % (ref 0.0–0.2)

## 2017-12-01 NOTE — Care Management Obs Status (Signed)
[place] NOTIFICATION   Patient Details  Name: Sherry [name] MRN: [id number] Date of Birth: [birth year]   Medicare Observation Status Notification Given:  Yes    Ninfa Meeker, RN 12/01/2017, 1:38 PM

## 2017-12-01 NOTE — Evaluation (Signed)
Physical Therapy Evaluation Patient Details Name: Sherry [name] MRN: [id number] DOB: Apr 06, 1950 Today's Date: 12/01/2017   History of Present Illness  Patient is a 67 y/o female who presents s/p left TKA. PMh includes CVA, aneurysm, DM, mitral valve prolapse, HTN.  Clinical Impression  Patient presents with pain and post surgical deficits s/p above surgery. Tolerated transfers and initiated gait training with Min A for balance/safety. Increased time to perform all mobility, limited mainly by pain. Instructed pt in exercises and knee precautions. Encouraged OOB to bathroom and up for chair for all meals with nursing. Pt minimal household ambulator PTA. Pt lives with grandson but he is not able to provide necessary assist at home. Would benefit from SNF to maximize independence and mobility prior to return home.     Follow Up Recommendations Follow surgeon's recommendation for DC plan and follow-up therapies;Supervision for mobility/OOB    Equipment Recommendations  Rolling walker with 5" wheels    Recommendations for Other Services       Precautions / Restrictions Precautions Precautions: Fall Restrictions Weight Bearing Restrictions: Yes LLE Weight Bearing: Weight bearing as tolerated      Mobility  Bed Mobility Overal bed mobility: Needs Assistance Bed Mobility: Supine to Sit     Supine to sit: Min assist;HOB elevated     General bed mobility comments: Assist to bring LLE to EOB, increased time and cues, use of rail.  Transfers Overall transfer level: Needs assistance Equipment used: Rolling walker (2 wheeled) Transfers: Sit to/from Stand Sit to Stand: Min assist         General transfer comment: Assist to power to standing with cues for hand placement/technique. Transferred to chair post ambulation.  Ambulation/Gait Ambulation/Gait assistance: Min assist Gait Distance (Feet): 7 Feet Assistive device: Rolling walker (2 wheeled) Gait Pattern/deviations:  Step-to pattern;Decreased stance time - left;Decreased step length - right;Trunk flexed Gait velocity: decreased   General Gait Details: Very Slow, guarded and unsteady gait. Increased WB through [place].   Stairs            Wheelchair Mobility    Modified Rankin (Stroke Patients Only)       Balance Overall balance assessment: Mild deficits observed, not formally tested                                           Pertinent Vitals/Pain Pain Assessment: 0-10 Pain Score: 7  Pain Location: left knee  Pain Descriptors / Indicators: Grimacing;Guarding;Discomfort;Sore Pain Intervention(s): Monitored during session;Repositioned;Limited activity within patient's tolerance;Patient requesting pain meds-RN notified    Home Living Family/patient expects to be discharged to:: Private residence Living Arrangements: Other relatives(grandson) Available Help at Discharge: Family;Available PRN/intermittently Type of Home: House Home Access: Stairs to enter Entrance Stairs-Rails: None Entrance Stairs-Number of Steps: 2 Home Layout: Two level;Able to live on main level with bedroom/bathroom Home Equipment: Grab bars - tub/shower;Cane - single point      Prior Function Level of Independence: Independent         Comments: Works as a Marine scientist. Limited with mobility with regards to distance. Trouble with stairs.      Hand Dominance   Dominant Hand: Right    Extremity/Trunk Assessment   Upper Extremity Assessment Upper Extremity Assessment: Defer to OT evaluation    Lower Extremity Assessment Lower Extremity Assessment: LLE deficits/detail LLE Deficits / Details: lacking full knee extension, >~20 degrees per visual observation,  limited in knee flexion secondary to pain. LLE: Unable to fully assess due to pain LLE Sensation: WNL LLE Coordination: WNL    Cervical / Trunk Assessment Cervical / Trunk Assessment: Normal  Communication   Communication: No  difficulties  Cognition Arousal/Alertness: Awake/alert Behavior During Therapy: WFL for tasks assessed/performed Overall Cognitive Status: Within Functional Limits for tasks assessed                                        General Comments      Exercises Total Joint Exercises Ankle Circles/Pumps: Both;20 reps;Supine;AROM Quad Sets: Both;10 reps;Seated;AROM Gluteal Sets: Both;AROM;Strengthening;20 reps;Seated   Assessment/Plan    PT Assessment Patient needs continued PT services  PT Problem List Decreased strength;Decreased mobility;Decreased range of motion;Decreased activity tolerance;Pain;Decreased skin integrity;Decreased balance;Decreased knowledge of precautions       PT Treatment Interventions DME instruction;Functional mobility training;Balance training;Patient/family education;Gait training;Therapeutic activities;Therapeutic exercise;Stair training    PT Goals (Current goals can be found in the Care Plan section)  Acute Rehab PT Goals Patient Stated Goal: to go to rehab  PT Goal Formulation: With patient Time For Goal Achievement: 12/15/17 Potential to Achieve Goals: Good    Frequency 7X/week   Barriers to discharge Decreased caregiver support lives with grandson and daughters live close but work.    Co-evaluation               AM-PAC PT "6 Clicks" Daily Activity  Outcome Measure Difficulty turning over in bed (including adjusting bedclothes, sheets and blankets)?: None Difficulty moving from lying on back to sitting on the side of the bed? : Unable Difficulty sitting down on and standing up from a chair with arms (e.g., wheelchair, bedside commode, etc,.)?: Unable Help needed moving to and from a bed to chair (including a wheelchair)?: A Little Help needed walking in hospital room?: A Little Help needed climbing 3-5 steps with a railing? : A Lot 6 Click Score: 14    End of Session Equipment Utilized During Treatment: Gait belt Activity  Tolerance: Patient limited by pain Patient left: in chair;with call bell/phone within reach Nurse Communication: Mobility status;Patient requests pain meds PT Visit Diagnosis: Pain;Difficulty in walking, not elsewhere classified (R26.2) Pain - Right/Left: Left Pain - part of body: Knee    Time: 8676-1950 PT Time Calculation (min) (ACUTE ONLY): 34 min   Charges:   PT Evaluation $PT Eval Low Complexity: 1 Low PT Treatments $Therapeutic Activity: 8-22 mins        Wray Kearns, PT, DPT Acute Rehabilitation Services Pager [phone] Office [phone]      Sherry [name] 12/01/2017, 9:02 AM

## 2017-12-01 NOTE — Progress Notes (Signed)
Nutrition Brief Note  Patient identified on the Malnutrition Screening Tool (MST) Report.   Patient reports minimal weight loss earlier this year related to medication change. She typically has a great appetite and eats very well. From review of weight encounters below, patient has not had any significant weight changes.   Wt Readings from Last 15 Encounters:  11/30/17 81.6 kg  11/24/17 83.3 kg  11/21/17 81.6 kg  11/17/17 81.6 kg  11/16/17 82.5 kg  11/14/17 81.6 kg  03/17/17 85.2 kg  02/14/17 86.9 kg  02/09/17 86.2 kg  02/02/17 86.2 kg  12/06/16 87.2 kg  09/15/16 83.6 kg  09/07/16 84.8 kg  08/25/16 84.8 kg  08/19/16 86.2 kg    Body mass index is 33.99 kg/m. Patient meets criteria for obesity based on current BMI.   Current diet order is CHO modified, patient is consuming approximately 50% of meals at this time. Will continue Ensure Enlive BID per patient request. Labs and medications reviewed.   No nutrition interventions warranted at this time. If nutrition issues arise, please consult RD.   Molli Barrows, RD, LDN, [place] Pager [phone] After Hours Pager [phone]

## 2017-12-01 NOTE — Care Management Note (Signed)
Case Management Note  Patient Details  Name: Sherry [name] MRN: [id number] Date of Birth: 1950/10/08  Subjective/Objective: Patient is 67 yr old female admitted for left total knee arthroplasty.   Action/Plan: Case manager spoke with patient concerning discharge plan. Patient says that Dr. Michela Pitcher she would need to go to SNF, but she feels she will do fine at home and plans to go home. Patient was preoperatively setup with [facility], no changes. She has adult children that will support her at discharge. CM has ordered DME. Will continue to follow.     Expected Discharge Date:  12/02/17               Expected Discharge Plan:  [place]  In-House Referral:  NA  Discharge planning Services  CM Consult  Post Acute Care Choice:  Durable Medical Equipment, Home Health Choice offered to:  Patient  DME Arranged:  3-N-1, [place] [facility] DME Agency:  [place]:  PT [place] Agency:  [facility] (formerly [employer])  Status of Service:  In process, will continue to follow  If discussed at Long Length of Stay Meetings, dates discussed:    Additional Comments:  Ninfa Meeker, RN 12/01/2017, 2:04 PM

## 2017-12-01 NOTE — Progress Notes (Signed)
Physical Therapy Treatment Patient Details Name: Sherry [name] MRN: [id number] DOB: 10-10-1950 Today's Date: 12/01/2017    History of Present Illness Patient is a 67 y/o female who presents s/p left TKA. PMh includes CVA, aneurysm, DM, mitral valve prolapse, HTN.    PT Comments    Patient progressing slowly towards PT goals. Continues to report pain with all movement. Improved ambulation distance with encouragement and cues for safety. Increased time to perform all mobility, limited mainly by pain. Provided handout and reviewed exercises. Requires assist for all aspects of mobility this afternoon. Would really benefit from post acute rehab stay. Will follow.    Follow Up Recommendations  Follow surgeon's recommendation for DC plan and follow-up therapies;Supervision for mobility/OOB     Equipment Recommendations  Rolling walker with 5" wheels    Recommendations for Other Services       Precautions / Restrictions Precautions Precautions: Fall Restrictions Weight Bearing Restrictions: Yes LLE Weight Bearing: Weight bearing as tolerated    Mobility  Bed Mobility Overal bed mobility: Needs Assistance Bed Mobility: Supine to Sit;Sit to Supine     Supine to sit: Min assist;HOB elevated Sit to supine: Min assist;HOB elevated   General bed mobility comments: Assist to bring LLE to EOB and to return to supine, increased time and cues, use of rail.  Transfers Overall transfer level: Needs assistance Equipment used: Rolling walker (2 wheeled) Transfers: Sit to/from Stand Sit to Stand: Min guard         General transfer comment: Min guard for safety. Stood from [employer], from toilet x1.   Ambulation/Gait Ambulation/Gait assistance: Min assist Gait Distance (Feet): 15 Feet(x2 bouts) Assistive device: Rolling walker (2 wheeled) Gait Pattern/deviations: Step-to pattern;Decreased stance time - left;Decreased step length - right;Trunk flexed;Decreased stride length Gait  velocity: decreased   General Gait Details: Very Slow, guarded and unsteady gait. Increased WB through [place]. Cues for RW management.    Stairs             Wheelchair Mobility    Modified Rankin (Stroke Patients Only)       Balance Overall balance assessment: Mild deficits observed, not formally tested                                          Cognition Arousal/Alertness: Awake/alert Behavior During Therapy: WFL for tasks assessed/performed Overall Cognitive Status: Within Functional Limits for tasks assessed                                        Exercises Total Joint Exercises Ankle Circles/Pumps: Both;20 reps;Supine;AROM Quad Sets: AROM;Left;10 reps;Supine Heel Slides: AAROM;Left;10 reps;Supine Hip ABduction/ADduction: AAROM;Left;10 reps;Supine Goniometric ROM: 10-60 degrees knee AROM    General Comments        Pertinent Vitals/Pain Pain Assessment: 0-10 Pain Score: (13) Pain Location: left knee  Pain Descriptors / Indicators: Grimacing;Guarding;Discomfort;Sore Pain Intervention(s): Monitored during session;Repositioned;Limited activity within patient's tolerance;Premedicated before session    Home Living                      Prior Function            PT Goals (current goals can now be found in the care plan section) Progress towards PT goals: Progressing toward goals    Frequency  7X/week      PT Plan Current plan remains appropriate    Co-evaluation              AM-PAC PT "6 Clicks" Daily Activity  Outcome Measure  Difficulty turning over in bed (including adjusting bedclothes, sheets and blankets)?: A Little Difficulty moving from lying on back to sitting on the side of the bed? : Unable Difficulty sitting down on and standing up from a chair with arms (e.g., wheelchair, bedside commode, etc,.)?: A Little Help needed moving to and from a bed to chair (including a wheelchair)?: A  Little Help needed walking in hospital room?: A Little Help needed climbing 3-5 steps with a railing? : Total 6 Click Score: 14    End of Session Equipment Utilized During Treatment: Gait belt Activity Tolerance: Patient limited by pain Patient left: in bed;with call bell/phone within reach Nurse Communication: Mobility status PT Visit Diagnosis: Pain;Difficulty in walking, not elsewhere classified (R26.2) Pain - Right/Left: Left Pain - part of body: Knee     Time: 7282-0601 PT Time Calculation (min) (ACUTE ONLY): 43 min  Charges:  $Gait Training: 8-22 mins $Therapeutic Exercise: 8-22 mins $Therapeutic Activity: 8-22 mins                     Wray Kearns, PT, DPT Acute Rehabilitation Services Pager [phone] Office [place] 12/01/2017, 2:32 PM

## 2017-12-01 NOTE — Plan of Care (Signed)
  Problem: Education: Goal: Knowledge of General Education information will improve Description: Including pain rating scale, medication(s)/side effects and non-pharmacologic comfort measures Outcome: Progressing   Problem: Health Behavior/Discharge Planning: Goal: Ability to manage health-related needs will improve Outcome: Progressing   Problem: Clinical Measurements: Goal: Ability to maintain clinical measurements within normal limits will improve Outcome: Progressing   Problem: Nutrition: Goal: Adequate nutrition will be maintained Outcome: Progressing   Problem: Safety: Goal: Ability to remain free from injury will improve Outcome: Progressing   Problem: Skin Integrity: Goal: Risk for impaired skin integrity will decrease Outcome: Progressing   

## 2017-12-01 NOTE — Progress Notes (Signed)
   Subjective: 1 Day Post-Op Procedure(s) (LRB): LEFT TOTAL KNEE REPLACEMENT (Left) Patient reports pain as moderate.    Objective: Vital signs in last 24 hours: Temp:  [97.3 F (36.3 C)-98.7 F (37.1 C)] 98 F (36.7 C) (10/24 0345) Pulse Rate:  [58-81] 71 (10/24 0345) Resp:  [8-20] 15 (10/24 0345) BP: (114-175)/(47-94) 140/67 (10/24 0345) SpO2:  [95 %-100 %] 100 % (10/24 0345) Weight:  [81.6 kg] 81.6 kg (10/23 1804)  Intake/Output from previous day: 10/23 0701 - 10/24 0700 In: 1370.7 [P.O.:390; I.V.:980.7] Out: 750 [Urine:700; Blood:50] Intake/Output this shift: No intake/output data recorded.  Recent Labs    12/01/17 0310  HGB 10.4*   Recent Labs    12/01/17 0310  WBC 10.1  RBC 3.63*  HCT 33.0*  PLT 256   Recent Labs    12/01/17 0310  NA 141  K 3.4*  CL 109  CO2 24  BUN 16  CREATININE 0.64  GLUCOSE 190*  CALCIUM 8.2*   No results for input(s): LABPT, INR in the last 72 hours.  Neurologically intact. Dressing was reinforced.  Dg Knee 1-2 Views Left  Result Date: 11/30/2017 CLINICAL DATA:  Postop EXAM: LEFT KNEE - 1-2 VIEW COMPARISON:  11/27/2004 FINDINGS: Cutaneous staples. Status post left knee replacement with normal alignment. Suprapatellar joint effusion. Gas in the soft tissues consistent with recent surgery. IMPRESSION: Status post left knee replacement with expected operative changes Electronically Signed   By: Donavan Foil M.D.   On: 11/30/2017 15:48    Assessment/Plan: 1 Day Post-Op Procedure(s) (LRB): LEFT TOTAL KNEE REPLACEMENT (Left) Up with therapy Discharge to SNF.   In chair with therapy with knee at 70 degrees flexion.   [name] 12/01/2017, 9:11 AM

## 2017-12-01 NOTE — Plan of Care (Signed)

## 2017-12-01 NOTE — Care Management CC44 (Signed)
Condition Code 44 Documentation Completed  Patient Details  Name: Sherry [name] MRN: [id number] Date of Birth: Oct 28, 1950   Condition Code 44 given:  Yes Patient signature on Condition Code 44 notice:  Yes Documentation of 2 MD's agreement:  Yes Code 44 added to claim:  Yes    Ninfa Meeker, RN 12/01/2017, 1:38 PM

## 2017-12-01 NOTE — Anesthesia Postprocedure Evaluation (Signed)
Anesthesia Post Note  Patient: DANAKA LLERA  Procedure(s) Performed: LEFT TOTAL KNEE REPLACEMENT (Left Knee)     Patient location during evaluation: PACU Anesthesia Type: Regional and General Level of consciousness: awake and alert Pain management: pain level controlled Vital Signs Assessment: post-procedure vital signs reviewed and stable Respiratory status: spontaneous breathing, nonlabored ventilation, respiratory function stable and patient connected to nasal cannula oxygen Cardiovascular status: blood pressure returned to baseline and stable Postop Assessment: no apparent nausea or vomiting Anesthetic complications: no    Last Vitals:  Vitals:   12/01/17 0025 12/01/17 0345  BP: 137/74 140/67  Pulse: 75 71  Resp: 17 15  Temp: 36.8 C 36.7 C  SpO2: 100% 100%    Last Pain:  Vitals:   12/01/17 1251  TempSrc:   PainSc: 10-Worst pain ever   Pain Goal: Patients Stated Pain Goal: 3 (12/01/17 0827)               Ayaan Shutes L Anacarolina Evelyn

## 2017-12-02 DIAGNOSIS — Z96651 Presence of right artificial knee joint: Secondary | ICD-10-CM | POA: Diagnosis not present

## 2017-12-02 DIAGNOSIS — Z885 Allergy status to narcotic agent status: Secondary | ICD-10-CM | POA: Diagnosis not present

## 2017-12-02 DIAGNOSIS — K219 Gastro-esophageal reflux disease without esophagitis: Secondary | ICD-10-CM | POA: Diagnosis not present

## 2017-12-02 DIAGNOSIS — Z888 Allergy status to other drugs, medicaments and biological substances status: Secondary | ICD-10-CM | POA: Diagnosis not present

## 2017-12-02 DIAGNOSIS — Z8673 Personal history of transient ischemic attack (TIA), and cerebral infarction without residual deficits: Secondary | ICD-10-CM | POA: Diagnosis not present

## 2017-12-02 DIAGNOSIS — Z807 Family history of other malignant neoplasms of lymphoid, hematopoietic and related tissues: Secondary | ICD-10-CM | POA: Diagnosis not present

## 2017-12-02 DIAGNOSIS — M1712 Unilateral primary osteoarthritis, left knee: Secondary | ICD-10-CM | POA: Diagnosis not present

## 2017-12-02 DIAGNOSIS — Z87892 Personal history of anaphylaxis: Secondary | ICD-10-CM | POA: Diagnosis not present

## 2017-12-02 DIAGNOSIS — I341 Nonrheumatic mitral (valve) prolapse: Secondary | ICD-10-CM | POA: Diagnosis not present

## 2017-12-02 DIAGNOSIS — I509 Heart failure, unspecified: Secondary | ICD-10-CM | POA: Diagnosis not present

## 2017-12-02 DIAGNOSIS — Z8249 Family history of ischemic heart disease and other diseases of the circulatory system: Secondary | ICD-10-CM | POA: Diagnosis not present

## 2017-12-02 DIAGNOSIS — E1159 Type 2 diabetes mellitus with other circulatory complications: Secondary | ICD-10-CM | POA: Diagnosis not present

## 2017-12-02 DIAGNOSIS — Z79899 Other long term (current) drug therapy: Secondary | ICD-10-CM | POA: Diagnosis not present

## 2017-12-02 DIAGNOSIS — I110 Hypertensive heart disease with heart failure: Secondary | ICD-10-CM | POA: Diagnosis not present

## 2017-12-02 DIAGNOSIS — Z886 Allergy status to analgesic agent status: Secondary | ICD-10-CM | POA: Diagnosis not present

## 2017-12-02 DIAGNOSIS — Z803 Family history of malignant neoplasm of breast: Secondary | ICD-10-CM | POA: Diagnosis not present

## 2017-12-02 DIAGNOSIS — Z7984 Long term (current) use of oral hypoglycemic drugs: Secondary | ICD-10-CM | POA: Diagnosis not present

## 2017-12-02 DIAGNOSIS — K449 Diaphragmatic hernia without obstruction or gangrene: Secondary | ICD-10-CM | POA: Diagnosis not present

## 2017-12-02 DIAGNOSIS — Z800 Family history of malignant neoplasm of digestive organs: Secondary | ICD-10-CM | POA: Diagnosis not present

## 2017-12-02 DIAGNOSIS — H8109 Meniere's disease, unspecified ear: Secondary | ICD-10-CM | POA: Diagnosis not present

## 2017-12-02 DIAGNOSIS — J45909 Unspecified asthma, uncomplicated: Secondary | ICD-10-CM | POA: Diagnosis not present

## 2017-12-02 LAB — CBC
HCT: 29.5 % — ABNORMAL LOW (ref 36.0–46.0)
Hemoglobin: 9.3 g/dL — ABNORMAL LOW (ref 12.0–15.0)
MCH: 28.2 pg (ref 26.0–34.0)
MCHC: 31.5 g/dL (ref 30.0–36.0)
MCV: 89.4 fL (ref 80.0–100.0)
Platelets: 227 10*3/uL (ref 150–400)
RBC: 3.3 MIL/uL — ABNORMAL LOW (ref 3.87–5.11)
RDW: 14 % (ref 11.5–15.5)
WBC: 11.5 10*3/uL — ABNORMAL HIGH (ref 4.0–10.5)
nRBC: 0 % (ref 0.0–0.2)

## 2017-12-02 LAB — GLUCOSE, CAPILLARY
Glucose-Capillary: 132 mg/dL — ABNORMAL HIGH (ref 70–99)
Glucose-Capillary: 160 mg/dL — ABNORMAL HIGH (ref 70–99)
Glucose-Capillary: 145 mg/dL — ABNORMAL HIGH (ref 70–99)

## 2017-12-02 MED ORDER — ASPIRIN 325 MG PO TABS: 325.0000 mg | ORAL_TABLET | Freq: Every day | ORAL | Status: DC

## 2017-12-02 MED ORDER — OXYCODONE-ACETAMINOPHEN 5-325 MG PO TABS: 1.0000 | ORAL_TABLET | ORAL | 0 refills | Status: AC | PRN

## 2017-12-02 MED ORDER — METHOCARBAMOL 500 MG PO TABS: 500.0000 mg | ORAL_TABLET | Freq: Three times a day (TID) | ORAL | 0 refills | Status: DC

## 2017-12-02 MED FILL — METHOCARBAMOL 500 MG TABLET: 500 | 13 days supply | Qty: 40 | Fill #0

## 2017-12-02 NOTE — Progress Notes (Signed)
Pt given discharge instructions and prescriptions. All belongings gathered to be sent home. Daughter taking her home.

## 2017-12-02 NOTE — Progress Notes (Signed)
Physical Therapy Treatment Patient Details Name: Sherry [name] MRN: [id number] DOB: March 28, 1950 Today's Date: 12/02/2017    History of Present Illness Patient is a 67 y/o female who presents s/p left TKA. PMh includes CVA, aneurysm, DM, mitral valve prolapse, HTN.    PT Comments    Patient reports having a rough night but feeling better this AM. Tolerated short distance ambulation within room but difficulty with sequencing today and not able to recall that she can place weight through LLE. Also talking about wanting to go home after stating yesterday rehab was a good option. Question what baseline cognition is. Pt will need 24/7 supervision for OOB mobility. Pt is a high fall risk, very slow to mobilize and requires encouragement. Pt fatigues very quickly and reluctant to mobilize left knee despite education. Reports she will be staying in her room so does not need to walk long distances. Will follow up to progress mobility as tolerated.      Follow Up Recommendations  Follow surgeon's recommendation for DC plan and follow-up therapies;Supervision for mobility/OOB     Equipment Recommendations  Rolling walker with 5" wheels    Recommendations for Other Services       Precautions / Restrictions Precautions Precautions: Fall;Knee Precaution Booklet Issued: Yes (comment) Precaution Comments: Reviewed precautions and handout Required Braces or Orthoses: Knee Immobilizer - Left Knee Immobilizer - Left: On when out of bed or walking(at home alone) Restrictions Weight Bearing Restrictions: Yes LLE Weight Bearing: Weight bearing as tolerated    Mobility  Bed Mobility Overal bed mobility: Needs Assistance Bed Mobility: Supine to Sit     Supine to sit: Min guard;HOB elevated     General bed mobility comments: Very slow to get to EOB using UEs to bring LLE to EOB and heavy use of rail, (per pt has things she can use to pull up on)?  Transfers Overall transfer level: Needs  assistance Equipment used: Rolling walker (2 wheeled) Transfers: Sit to/from Stand Sit to Stand: Min guard         General transfer comment: Min guard for safety. Stood from [employer], transferred to chair post ambulation.  Ambulation/Gait Ambulation/Gait assistance: Min assist Gait Distance (Feet): 7 Feet Assistive device: Rolling walker (2 wheeled) Gait Pattern/deviations: Step-to pattern;Decreased stance time - left;Decreased step length - right;Trunk flexed;Decreased stride length Gait velocity: decreased Gait velocity interpretation: <1.31 ft/sec, indicative of household ambulator General Gait Details: Very Slow and guarded gait needing cues for sequencing LEs and RW to get to chair today. Increased WB through [place]. Fatigues quickly. Declined further ambulation.   Stairs             Wheelchair Mobility    Modified Rankin (Stroke Patients Only)       Balance Overall balance assessment: Needs assistance Sitting-balance support: Feet supported;Single extremity supported Sitting balance-Leahy Scale: Fair     Standing balance support: During functional activity;Bilateral upper extremity supported Standing balance-Leahy Scale: Poor Standing balance comment: Reliant on BUEs for support in standing.                             Cognition Arousal/Alertness: Awake/alert Behavior During Therapy: WFL for tasks assessed/performed Overall Cognitive Status: No family/caregiver present to determine baseline cognitive functioning Area of Impairment: Problem solving;Memory;Safety/judgement                     Memory: Decreased short-term memory   Safety/Judgement: Decreased awareness of deficits  Problem Solving: Requires verbal cues;Difficulty sequencing General Comments: Pt seems to have some memory deficits, "Am i allowed to place weight through my left leg" after being told multiple times yesterday and after walking, "what am I supposed to be doing  here?" while attempting to walk to chair. Having difficulty sequencing LEs with RW needing cues.       Exercises Total Joint Exercises Ankle Circles/Pumps: Both;Supine;AROM;10 reps Quad Sets: Left;5 reps;Supine Goniometric ROM: 10 degrees knee extension AROM, per visual observation knee flexion AROM 60 degrees.     General Comments General comments (skin integrity, edema, etc.): Donned KI per MD request for OOB at home alone.      Pertinent Vitals/Pain Pain Assessment: 0-10 Pain Score: 7  Pain Location: left knee  Pain Descriptors / Indicators: Grimacing;Guarding;Discomfort;Sore Pain Intervention(s): Monitored during session;Repositioned;Premedicated before session;Limited activity within patient's tolerance    Home Living                      Prior Function            PT Goals (current goals can now be found in the care plan section) Progress towards PT goals: Not progressing toward goals - comment(self limiting, pain)    Frequency           PT Plan Current plan remains appropriate    Co-evaluation              AM-PAC PT "6 Clicks" Daily Activity  Outcome Measure  Difficulty turning over in bed (including adjusting bedclothes, sheets and blankets)?: A Little Difficulty moving from lying on back to sitting on the side of the bed? : A Little Difficulty sitting down on and standing up from a chair with arms (e.g., wheelchair, bedside commode, etc,.)?: A Little Help needed moving to and from a bed to chair (including a wheelchair)?: A Little Help needed walking in hospital room?: A Little Help needed climbing 3-5 steps with a railing? : Total 6 Click Score: 16    End of Session Equipment Utilized During Treatment: Gait belt;Left knee immobilizer Activity Tolerance: Patient limited by pain Patient left: in chair;with call bell/phone within reach Nurse Communication: Mobility status PT Visit Diagnosis: Pain;Difficulty in walking, not elsewhere  classified (R26.2) Pain - Right/Left: Left Pain - part of body: Knee     Time: 9629-5284 PT Time Calculation (min) (ACUTE ONLY): 25 min  Charges:  $Gait Training: 8-22 mins $Therapeutic Activity: 8-22 mins                     Wray Kearns, PT, DPT Acute Rehabilitation Services Pager [phone] Office [place] 12/02/2017, 8:25 AM

## 2017-12-02 NOTE — Progress Notes (Signed)
Physical Therapy Treatment Patient Details Name: Sherry [name] MRN: [id number] DOB: 1950/03/20 Today's Date: 12/02/2017    History of Present Illness Patient is a 67 y/o female who presents s/p left TKA. PMh includes CVA, aneurysm, DM, mitral valve prolapse, HTN.    PT Comments    Patient limited this session secondary to pain due to sitting up in chair since AM session without moving LLE despite education to move every hour. Question pt's memory/understanding of current situation (not being able to care for self at home). Increased time to perform all movements. Tolerated multiple standing bouts, SPT to/from [facility] and taking a few steps in room. Declined further ambulation due to not having to perform at home per pt report and pain. Concerned about pt's safety at home. Recommend 24/7 hands on assist for OOB mobility. Will follow.   Follow Up Recommendations  Follow surgeon's recommendation for DC plan and follow-up therapies;Supervision for mobility/OOB     Equipment Recommendations  Rolling walker with 5" wheels    Recommendations for Other Services       Precautions / Restrictions Precautions Precautions: Fall;Knee Precaution Booklet Issued: Yes (comment) Precaution Comments: Reviewed precautions and handout Required Braces or Orthoses: Knee Immobilizer - Left Knee Immobilizer - Left: On when out of bed or walking Restrictions Weight Bearing Restrictions: Yes LLE Weight Bearing: Weight bearing as tolerated    Mobility  Bed Mobility Overal bed mobility: Needs Assistance Bed Mobility: Sit to Supine       Sit to supine: Min assist;HOB elevated   General bed mobility comments: Assist to bring LLE into bed; not able to do on own. very slow.  Transfers Overall transfer level: Needs assistance Equipment used: Rolling walker (2 wheeled) Transfers: Sit to/from [employer] Sit to Stand: Min guard Stand pivot transfers: Min assist       General  transfer comment: Min guard for safety. Stood from chair x1, SPT chair to [facility] with Min A. incontinent of urine upon standing.  Ambulation/Gait Ambulation/Gait assistance: Min guard Gait Distance (Feet): 4 Feet Assistive device: Rolling walker (2 wheeled) Gait Pattern/deviations: Step-to pattern;Decreased stance time - left;Decreased step length - right;Trunk flexed;Decreased stride length Gait velocity: decreased   General Gait Details: Very slow, guarded gait with pt scooting RLE to advance; cues for RW management. Limited by pain.   Stairs             Wheelchair Mobility    Modified Rankin (Stroke Patients Only)       Balance Overall balance assessment: Needs assistance Sitting-balance support: Feet supported;Single extremity supported Sitting balance-Leahy Scale: Fair     Standing balance support: During functional activity;Bilateral upper extremity supported Standing balance-Leahy Scale: Poor Standing balance comment: Reliant on BUEs for support in standing. Able to take 1 UE off to perform pericare but requires close Min guard.                             Cognition Arousal/Alertness: Awake/alert Behavior During Therapy: WFL for tasks assessed/performed Overall Cognitive Status: No family/caregiver present to determine baseline cognitive functioning                                        Exercises      General Comments        Pertinent Vitals/Pain Pain Assessment: 0-10 Pain Score: 7  Pain Location:  left knee  Pain Descriptors / Indicators: Grimacing;Guarding;Discomfort;Sore Pain Intervention(s): Monitored during session;Repositioned;Limited activity within patient's tolerance    Home Living                      Prior Function            PT Goals (current goals can now be found in the care plan section) Progress towards PT goals: Not progressing toward goals - comment(self limiting, pain)    Frequency     7X/week      PT Plan Current plan remains appropriate    Co-evaluation              AM-PAC PT "6 Clicks" Daily Activity  Outcome Measure  Difficulty turning over in bed (including adjusting bedclothes, sheets and blankets)?: A Little Difficulty moving from lying on back to sitting on the side of the bed? : Unable Difficulty sitting down on and standing up from a chair with arms (e.g., wheelchair, bedside commode, etc,.)?: A Little Help needed moving to and from a bed to chair (including a wheelchair)?: A Little Help needed walking in hospital room?: A Little Help needed climbing 3-5 steps with a railing? : Total 6 Click Score: 14    End of Session Equipment Utilized During Treatment: Gait belt;Left knee immobilizer Activity Tolerance: Patient limited by pain Patient left: in bed;with call bell/phone within reach;with bed alarm set Nurse Communication: Mobility status PT Visit Diagnosis: Pain;Difficulty in walking, not elsewhere classified (R26.2) Pain - Right/Left: Left Pain - part of body: Knee     Time: 2505-3976 PT Time Calculation (min) (ACUTE ONLY): 27 min  Charges:  $Therapeutic Activity: 23-37 mins                     Wray Kearns, PT, DPT Acute Rehabilitation Services Pager [phone] Office [place] 12/02/2017, 3:53 PM

## 2017-12-02 NOTE — Progress Notes (Signed)
   Subjective: 2 Days Post-Op Procedure(s) (LRB): LEFT TOTAL KNEE REPLACEMENT (Left) Patient reports pain as mild and moderate.    Objective: Vital signs in last 24 hours: Temp:  [98.4 F (36.9 C)-99.1 F (37.3 C)] 98.4 F (36.9 C) (10/25 0333) Pulse Rate:  [77-80] 80 (10/25 0333) Resp:  [15-17] 15 (10/25 0333) BP: (131-151)/(71-77) 131/77 (10/25 0333) SpO2:  [98 %-100 %] 100 % (10/25 0333)  Intake/Output from previous day: 10/24 0701 - 10/25 0700 In: 1074 [P.O.:1074] Out: 800 [Urine:800] Intake/Output this shift: No intake/output data recorded.  Recent Labs    12/01/17 0310 12/02/17 0327  HGB 10.4* 9.3*   Recent Labs    12/01/17 0310 12/02/17 0327  WBC 10.1 11.5*  RBC 3.63* 3.30*  HCT 33.0* 29.5*  PLT 256 227   Recent Labs    12/01/17 0310  NA 141  K 3.4*  CL 109  CO2 24  BUN 16  CREATININE 0.64  GLUCOSE 190*  CALCIUM 8.2*   No results for input(s): LABPT, INR in the last 72 hours.  Neurologically intact, incision looks good.  No results found.  Assessment/Plan: 2 Days Post-Op Procedure(s) (LRB): LEFT TOTAL KNEE REPLACEMENT (Left) Up with therapy, dressing changed . Discharge home after therapy today.   Sherry [name] 12/02/2017, 7:50 AM

## 2017-12-02 NOTE — Plan of Care (Signed)
  Problem: Health Behavior/Discharge Planning: Goal: Ability to manage health-related needs will improve Outcome: Progressing   Problem: Activity: Goal: Risk for activity intolerance will decrease Outcome: Progressing   Problem: Pain Managment: Goal: General experience of comfort will improve Outcome: Progressing   

## 2017-12-02 NOTE — Discharge Instructions (Signed)
Work on knee  Bending and straightening of your knee. Work on tightening the quad muscle on top of your thigh and smash the tomato exercise. See Dr. Lorin Mercy in one week. OK to get dressing and incision wet but no bathtub until staples out.

## 2017-12-04 DIAGNOSIS — H8109 Meniere's disease, unspecified ear: Secondary | ICD-10-CM | POA: Diagnosis not present

## 2017-12-04 DIAGNOSIS — Z8673 Personal history of transient ischemic attack (TIA), and cerebral infarction without residual deficits: Secondary | ICD-10-CM | POA: Diagnosis not present

## 2017-12-04 DIAGNOSIS — E7800 Pure hypercholesterolemia, unspecified: Secondary | ICD-10-CM | POA: Diagnosis not present

## 2017-12-04 DIAGNOSIS — Z7984 Long term (current) use of oral hypoglycemic drugs: Secondary | ICD-10-CM | POA: Diagnosis not present

## 2017-12-04 DIAGNOSIS — I509 Heart failure, unspecified: Secondary | ICD-10-CM | POA: Diagnosis not present

## 2017-12-04 DIAGNOSIS — E1142 Type 2 diabetes mellitus with diabetic polyneuropathy: Secondary | ICD-10-CM | POA: Diagnosis not present

## 2017-12-04 DIAGNOSIS — Z9181 History of falling: Secondary | ICD-10-CM | POA: Diagnosis not present

## 2017-12-04 DIAGNOSIS — Z96653 Presence of artificial knee joint, bilateral: Secondary | ICD-10-CM | POA: Diagnosis not present

## 2017-12-04 DIAGNOSIS — I341 Nonrheumatic mitral (valve) prolapse: Secondary | ICD-10-CM | POA: Diagnosis not present

## 2017-12-04 DIAGNOSIS — Z471 Aftercare following joint replacement surgery: Secondary | ICD-10-CM | POA: Diagnosis not present

## 2017-12-04 DIAGNOSIS — I110 Hypertensive heart disease with heart failure: Secondary | ICD-10-CM | POA: Diagnosis not present

## 2017-12-04 DIAGNOSIS — M47812 Spondylosis without myelopathy or radiculopathy, cervical region: Secondary | ICD-10-CM | POA: Diagnosis not present

## 2017-12-04 DIAGNOSIS — Z7982 Long term (current) use of aspirin: Secondary | ICD-10-CM | POA: Diagnosis not present

## 2017-12-04 DIAGNOSIS — E049 Nontoxic goiter, unspecified: Secondary | ICD-10-CM | POA: Diagnosis not present

## 2017-12-04 DIAGNOSIS — G5603 Carpal tunnel syndrome, bilateral upper limbs: Secondary | ICD-10-CM | POA: Diagnosis not present

## 2017-12-05 ENCOUNTER — Telehealth: Payer: Self-pay

## 2017-12-05 ENCOUNTER — Telehealth (INDEPENDENT_AMBULATORY_CARE_PROVIDER_SITE_OTHER): Payer: Self-pay | Admitting: Orthopaedic Surgery

## 2017-12-05 NOTE — Telephone Encounter (Signed)
Sherry [name] with [facility] left a message requesting VO for 4x a week for 1 week and 3x a week for 1 week for transfer, strengthening and Gait training.  CB#[phone].  Thank you.

## 2017-12-05 NOTE — Discharge Summary (Signed)
Patient ID: Sherry [name] MRN: [id number] DOB/AGE: Jun 29, 1950 67 y.o.  Admit date: 11/30/2017 Discharge date: 12/05/2017  Admission Diagnoses:  Active Problems:   Unilateral primary osteoarthritis, left knee   Arthritis of left knee   Discharge Diagnoses:  Active Problems:   Unilateral primary osteoarthritis, left knee   Arthritis of left knee  status post Procedure(s): LEFT TOTAL KNEE REPLACEMENT  Past Medical History:  Diagnosis Date  . Asthma   . Cerebral aneurysm   . Complication of anesthesia HYPOTENSION INTRAOP W/ HYSTERECTOMY IN 1987--  DID OK W/ TOTAL KNEE IN 2008  . Diabetes mellitus   . DJD (degenerative joint disease)   . Dyslipidemia   . GERD (gastroesophageal reflux disease)   . H/O hiatal hernia   . History of CHF (congestive heart failure) 2010-  RESOLVED  . History of peptic ulcer YRS AGO  . Hypertension   . Meniere's disease   . Mitral valve prolapse occasional palpitations w/ chest discomfort  . OA (osteoarthritis)   . Rotator cuff tear, left   . Stroke [facility])     Surgeries: Procedure(s): LEFT TOTAL KNEE REPLACEMENT on 11/30/2017   Consultants:   Discharged Condition: Improved  Hospital Course: Sherry [name] is an 67 y.o. female who was admitted 11/30/2017 for operative treatment of left knee djd. Patient failed conservative treatments (please see the history and physical for the specifics) and had severe unremitting pain that affects sleep, daily activities and work/hobbies. After pre-op clearance, the patient was taken to the operating room on 11/30/2017 and underwent  Procedure(s): LEFT TOTAL KNEE REPLACEMENT.    Patient was given perioperative antibiotics:  Anti-infectives (From admission, onward)   Start     Dose/Rate Route Frequency Ordered Stop   11/30/17 2200  ceFAZolin (ANCEF) IVPB 1 g/50 mL premix     1 g 100 mL/hr over 30 Minutes Intravenous Every 8 hours 11/30/17 1752 12/01/17 0545   11/30/17 1115  ceFAZolin (ANCEF)  IVPB 2g/100 mL premix     2 g 200 mL/hr over 30 Minutes Intravenous On call to O.R. 11/30/17 1101 11/30/17 1325       Patient was given sequential compression devices and early ambulation to prevent DVT.   Patient benefited maximally from hospital stay and there were no complications. At the time of discharge, the patient was urinating/moving their bowels without difficulty, tolerating a regular diet, pain is controlled with oral pain medications and they have been cleared by PT/OT.   Recent vital signs: No data found.   Recent laboratory studies: No results for input(s): WBC, HGB, HCT, PLT, NA, K, CL, CO2, BUN, CREATININE, GLUCOSE, INR, CALCIUM in the last 72 hours.  Invalid input(s): PT, 2   Discharge Medications:   Allergies as of 12/02/2017      Reactions   Influenza Vaccines Anaphylaxis   Demerol Other (See Comments)   SEIZURE   Nsaids Other (See Comments)   NAPROXEN, IBUPROFEN, SKELAXIN---  CAUSES PALPITATIONS   Prednisone Other (See Comments)   Caused her glucose to go up to 500 and went to ED   Tramadol Other (See Comments)   Contraindicated with Victoza    Codeine Itching   Mango Flavor [flavoring Agent] Itching      Medication List    TAKE these medications   ACCU-CHEK SOFTCLIX LANCET DEV Kit USE AS DIRECTED   albuterol (2.5 MG/3ML) 0.083% nebulizer solution Commonly known as:  PROVENTIL Take 3 mLs (2.5 mg total) by nebulization every 6 (six) hours as needed for  wheezing or shortness of breath.   albuterol 108 (90 Base) MCG/ACT inhaler Commonly known as:  PROVENTIL HFA;VENTOLIN HFA Inhale 1-2 puffs into the lungs every 6 (six) hours as needed for wheezing or shortness of breath.   aspirin 325 MG tablet Take 1 tablet (325 mg total) by mouth daily.   cetirizine 10 MG tablet Commonly known as:  ZYRTEC Take 1 tablet (10 mg total) by mouth daily.   clopidogrel 75 MG tablet Commonly known as:  PLAVIX TAKE 1 TABLET BY MOUTH  DAILY   docusate sodium 100 MG  capsule Commonly known as:  COLACE Take 1 capsule (100 mg total) by mouth daily.   empagliflozin 10 MG Tabs tablet Commonly known as:  JARDIANCE TAKE 10 mg TABLET BY MOUTH  DAILY What changed:    how much to take  how to take this  when to take this  additional instructions   famotidine 10 MG tablet Commonly known as:  PEPCID Take 10 mg by mouth daily.   fluticasone 220 MCG/ACT inhaler Commonly known as:  FLOVENT HFA Inhale 1 puff into the lungs 2 (two) times daily. May increase to 2 puffs if needed What changed:    how much to take  when to take this  reasons to take this  additional instructions   furosemide 20 MG tablet Commonly known as:  LASIX Take 1 tablet (20 mg total) by mouth daily.   gabapentin 300 MG capsule Commonly known as:  NEURONTIN TAKE 2 CAPSULES BY MOUTH  TWO TIMES DAILY What changed:  See the new instructions.   ipratropium 0.03 % nasal spray Commonly known as:  ATROVENT Place 2 sprays into the nose 4 (four) times daily. What changed:    when to take this  reasons to take this   linagliptin 5 MG Tabs tablet Commonly known as:  TRADJENTA Take 0.5 tablets (2.5 mg total) by mouth daily.   lisinopril 20 MG tablet Commonly known as:  PRINIVIL,ZESTRIL Take 1 tablet (20 mg total) by mouth daily.   metFORMIN 500 MG tablet Commonly known as:  GLUCOPHAGE Take 2 tablets (1,000 mg total) by mouth 2 (two) times daily. TAKE 2 TABLETS(1000 MG) BY MOUTH TWICE DAILY WITH A MEAL What changed:  additional instructions   methocarbamol 500 MG tablet Commonly known as:  ROBAXIN Take 1 tablet (500 mg total) by mouth 3 (three) times daily.   metoprolol tartrate 25 MG tablet Commonly known as:  LOPRESSOR Take 1 tablet (25 mg total) by mouth 2 (two) times daily.   montelukast 10 MG tablet Commonly known as:  SINGULAIR Take 1 tablet (10 mg total) by mouth at bedtime.   MULTIVITAMIN PO Take 2 tablets by mouth daily.   oxyCODONE-acetaminophen  5-325 MG tablet Commonly known as:  PERCOCET/ROXICET Take 1 tablet by mouth every 4 (four) hours as needed for severe pain.   QUEtiapine 100 MG tablet Commonly known as:  SEROQUEL Take 0.5 tablets (50 mg total) by mouth at bedtime.   sertraline 50 MG tablet Commonly known as:  ZOLOFT TAKE ONE-HALF TABLET BY  MOUTH AT BEDTIME   Vitamin C Chew Chew 2 each by mouth daily.       Diagnostic Studies: Dg Knee 1-2 Views Left  Result Date: 11/30/2017 CLINICAL DATA:  Postop EXAM: LEFT KNEE - 1-2 VIEW COMPARISON:  11/27/2004 FINDINGS: Cutaneous staples. Status post left knee replacement with normal alignment. Suprapatellar joint effusion. Gas in the soft tissues consistent with recent surgery. IMPRESSION: Status post left knee replacement  with expected operative changes Electronically Signed   By: Donavan Foil M.D.   On: 11/30/2017 15:48      Follow-up Information    [name], MD Follow up in 1 week(s).   Specialty:  Orthopedic Surgery Contact information: [place] [address] [phone]           Discharge Plan:  discharge to home  Disposition:     Signed: Benjiman Core  12/05/2017, 12:07 PM

## 2017-12-05 NOTE — Telephone Encounter (Signed)
Ok for orders? 

## 2017-12-05 NOTE — Telephone Encounter (Signed)
12/05/17  Transition Care Management Follow-up Telephone Call  ADMISSION DATE: 11/30/17  DISCHARGE DATE: 12/02/17  How have you been since you were released from the hospital? Has had good support and patient is managing pain better.   Do you understand why you were in the hospital?  Yes   Do you understand the discharge instrcutions?Yes     Items Reviewed:  Medications reviewed: Yes   Allergies reviewed: Yes   Dietary changes reviewed: Low carbohydrate  Referrals reviewed: Appointment scheduled.  Functional Questionnaire:  Activities of Daily Living (ADLs): Needs assistance at this time. Any patient concerns?  Needs refill on Robaxin  [facility].   Confirmed importance and date/time of follow-up visits scheduled: Yes   Confirmed with patient if condition begins to worsen call PCP or go to the ER. Yes   Patient was given the office number and encouragred to call back with questions or concerns.Yes

## 2017-12-05 NOTE — Telephone Encounter (Signed)
TCM call made to patient regarding hospital follow up. Left message for return call.

## 2017-12-06 DIAGNOSIS — H8109 Meniere's disease, unspecified ear: Secondary | ICD-10-CM | POA: Diagnosis not present

## 2017-12-06 DIAGNOSIS — I341 Nonrheumatic mitral (valve) prolapse: Secondary | ICD-10-CM | POA: Diagnosis not present

## 2017-12-06 DIAGNOSIS — Z7984 Long term (current) use of oral hypoglycemic drugs: Secondary | ICD-10-CM | POA: Diagnosis not present

## 2017-12-06 DIAGNOSIS — M47812 Spondylosis without myelopathy or radiculopathy, cervical region: Secondary | ICD-10-CM | POA: Diagnosis not present

## 2017-12-06 DIAGNOSIS — E7800 Pure hypercholesterolemia, unspecified: Secondary | ICD-10-CM | POA: Diagnosis not present

## 2017-12-06 DIAGNOSIS — Z471 Aftercare following joint replacement surgery: Secondary | ICD-10-CM | POA: Diagnosis not present

## 2017-12-06 DIAGNOSIS — Z7982 Long term (current) use of aspirin: Secondary | ICD-10-CM | POA: Diagnosis not present

## 2017-12-06 DIAGNOSIS — Z96653 Presence of artificial knee joint, bilateral: Secondary | ICD-10-CM | POA: Diagnosis not present

## 2017-12-06 DIAGNOSIS — Z8673 Personal history of transient ischemic attack (TIA), and cerebral infarction without residual deficits: Secondary | ICD-10-CM | POA: Diagnosis not present

## 2017-12-06 DIAGNOSIS — E1142 Type 2 diabetes mellitus with diabetic polyneuropathy: Secondary | ICD-10-CM | POA: Diagnosis not present

## 2017-12-06 DIAGNOSIS — E049 Nontoxic goiter, unspecified: Secondary | ICD-10-CM | POA: Diagnosis not present

## 2017-12-06 DIAGNOSIS — I509 Heart failure, unspecified: Secondary | ICD-10-CM | POA: Diagnosis not present

## 2017-12-06 DIAGNOSIS — G5603 Carpal tunnel syndrome, bilateral upper limbs: Secondary | ICD-10-CM | POA: Diagnosis not present

## 2017-12-06 DIAGNOSIS — Z9181 History of falling: Secondary | ICD-10-CM | POA: Diagnosis not present

## 2017-12-06 DIAGNOSIS — I110 Hypertensive heart disease with heart failure: Secondary | ICD-10-CM | POA: Diagnosis not present

## 2017-12-06 NOTE — Telephone Encounter (Signed)
Yes Ok thank you 

## 2017-12-06 NOTE — Telephone Encounter (Signed)
I called Sherry [name] and advised. 

## 2017-12-08 DIAGNOSIS — H8109 Meniere's disease, unspecified ear: Secondary | ICD-10-CM | POA: Diagnosis not present

## 2017-12-08 DIAGNOSIS — I341 Nonrheumatic mitral (valve) prolapse: Secondary | ICD-10-CM | POA: Diagnosis not present

## 2017-12-08 DIAGNOSIS — I509 Heart failure, unspecified: Secondary | ICD-10-CM | POA: Diagnosis not present

## 2017-12-08 DIAGNOSIS — E049 Nontoxic goiter, unspecified: Secondary | ICD-10-CM | POA: Diagnosis not present

## 2017-12-08 DIAGNOSIS — E7800 Pure hypercholesterolemia, unspecified: Secondary | ICD-10-CM | POA: Diagnosis not present

## 2017-12-08 DIAGNOSIS — Z7984 Long term (current) use of oral hypoglycemic drugs: Secondary | ICD-10-CM | POA: Diagnosis not present

## 2017-12-08 DIAGNOSIS — G5603 Carpal tunnel syndrome, bilateral upper limbs: Secondary | ICD-10-CM | POA: Diagnosis not present

## 2017-12-08 DIAGNOSIS — Z8673 Personal history of transient ischemic attack (TIA), and cerebral infarction without residual deficits: Secondary | ICD-10-CM | POA: Diagnosis not present

## 2017-12-08 DIAGNOSIS — Z96653 Presence of artificial knee joint, bilateral: Secondary | ICD-10-CM | POA: Diagnosis not present

## 2017-12-08 DIAGNOSIS — M47812 Spondylosis without myelopathy or radiculopathy, cervical region: Secondary | ICD-10-CM | POA: Diagnosis not present

## 2017-12-08 DIAGNOSIS — I110 Hypertensive heart disease with heart failure: Secondary | ICD-10-CM | POA: Diagnosis not present

## 2017-12-08 DIAGNOSIS — Z9181 History of falling: Secondary | ICD-10-CM | POA: Diagnosis not present

## 2017-12-08 DIAGNOSIS — E1142 Type 2 diabetes mellitus with diabetic polyneuropathy: Secondary | ICD-10-CM | POA: Diagnosis not present

## 2017-12-08 DIAGNOSIS — Z7982 Long term (current) use of aspirin: Secondary | ICD-10-CM | POA: Diagnosis not present

## 2017-12-08 DIAGNOSIS — Z471 Aftercare following joint replacement surgery: Secondary | ICD-10-CM | POA: Diagnosis not present

## 2017-12-09 DIAGNOSIS — I341 Nonrheumatic mitral (valve) prolapse: Secondary | ICD-10-CM | POA: Diagnosis not present

## 2017-12-09 DIAGNOSIS — E7800 Pure hypercholesterolemia, unspecified: Secondary | ICD-10-CM | POA: Diagnosis not present

## 2017-12-09 DIAGNOSIS — H8109 Meniere's disease, unspecified ear: Secondary | ICD-10-CM | POA: Diagnosis not present

## 2017-12-09 DIAGNOSIS — Z471 Aftercare following joint replacement surgery: Secondary | ICD-10-CM | POA: Diagnosis not present

## 2017-12-09 DIAGNOSIS — Z7982 Long term (current) use of aspirin: Secondary | ICD-10-CM | POA: Diagnosis not present

## 2017-12-09 DIAGNOSIS — Z7984 Long term (current) use of oral hypoglycemic drugs: Secondary | ICD-10-CM | POA: Diagnosis not present

## 2017-12-09 DIAGNOSIS — I110 Hypertensive heart disease with heart failure: Secondary | ICD-10-CM | POA: Diagnosis not present

## 2017-12-09 DIAGNOSIS — M47812 Spondylosis without myelopathy or radiculopathy, cervical region: Secondary | ICD-10-CM | POA: Diagnosis not present

## 2017-12-09 DIAGNOSIS — E049 Nontoxic goiter, unspecified: Secondary | ICD-10-CM | POA: Diagnosis not present

## 2017-12-09 DIAGNOSIS — Z9181 History of falling: Secondary | ICD-10-CM | POA: Diagnosis not present

## 2017-12-09 DIAGNOSIS — Z96653 Presence of artificial knee joint, bilateral: Secondary | ICD-10-CM | POA: Diagnosis not present

## 2017-12-09 DIAGNOSIS — G5603 Carpal tunnel syndrome, bilateral upper limbs: Secondary | ICD-10-CM | POA: Diagnosis not present

## 2017-12-09 DIAGNOSIS — I509 Heart failure, unspecified: Secondary | ICD-10-CM | POA: Diagnosis not present

## 2017-12-09 DIAGNOSIS — E1142 Type 2 diabetes mellitus with diabetic polyneuropathy: Secondary | ICD-10-CM | POA: Diagnosis not present

## 2017-12-09 DIAGNOSIS — Z8673 Personal history of transient ischemic attack (TIA), and cerebral infarction without residual deficits: Secondary | ICD-10-CM | POA: Diagnosis not present

## 2017-12-11 NOTE — Progress Notes (Deleted)
[place] at [facility] [address] [place], [place] [address] [phone]  Date:  12/19/2017   Name:  Sherry [name]   DOB:  1951/02/06   MRN:  [id number]  PCP:  Darreld Mclean, MD    Chief Complaint: No chief complaint on file.   History of Present Illness:  Sherry [name] is a 67 y.o. very pleasant female patient who presents with the following:  Here today for a hospital follow-up visit- I think  From when she had a total knee last month.  History of DM, peripheral neuropathy, CHF, hyperlipidemia, HTN, TIA in 2016 [facility] neurology visit:  ASSESSMENT AND PLAN 67 y.o.African Americanfemalewith PMH of HTN, DM, HLD, MVP admitted on 11/11/14 for complaints of facial droop, decreased sensation left face as well as stabbing sensation in her left shoulder and arm pit after an argument with husband. 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 Stroke work up with TTE and CUS unremarkable. LDL 78 and A1C 7.4. She was considered possible TIA due to multiple risk factors but also not to exclude from anxiety as the etiology. She was discharged with ASA and lipitor. During the interval time, she saw Dr. DEstanislado Pandyfor aneurysm and cerebral angio showed right M1 75% stenosis and right [id number] aneurysm at right ICA cavernous segment. Pt optioned conservative treatment towards aneurysm so far. 04/2015 was admitted for possible right brain TIA vs. Anxiety.  Of the time, patient has multiple complaints with imbalance, occasionally falling back pain, neck pain, headache with occipital neuralgia, occasionally episodic left facial numbness and drooping.Symptoms consistent with occipital neuralgia, cervical radiculopathy, stress/anxiety related symptoms. Will do EMG and refer to PT. However, patient has been on dual antiplatelet for more than a year, will change to monotherapy. Repeat MRA after her  last visit in January 2019 showed stable aneurysm and vasculature no change from prior scan.  Neuro exam is normal.The patient is a current patient of Sherry [name] who no longer works in our clinic.  This note is sent to the work in doctor.     Plan:Discussed with Sherry [name] continue plavix for stroke prevention, need to stop 5 days prior to surgery and resume after surgery Repeat MRA to evaluate aneurysm in Jan without change -EMG/NCS showed bil CTS  Follow up with your primary care physician for stroke risk factor modification. Recommend maintain blood pressure goal 130/80, diabetes with hemoglobin A1c goal below 6.5% and lipids with LDL cholesterol goal below 70 mg/dL.   healthy diet and regular exercise  Patient Active Problem List   Diagnosis Date Noted  . Arthritis of left knee 11/30/2017  . S/P total knee replacement, right 03/29/2017  . Spondylosis without myelopathy or radiculopathy, cervical region 03/29/2017  . Unilateral primary osteoarthritis, left knee 03/29/2017  . Bilateral carpal tunnel syndrome 03/29/2017  . Acute asthma exacerbation 08/19/2016  . Preoperative cardiovascular examination 08/16/2016  . Cervicalgia 03/23/2016  . Acute back pain with sciatica, right 01/14/2016  . Vertigo 04/14/2015  . Anxiety state 01/22/2015  . HLD (hyperlipidemia) 01/22/2015  . Type 2 diabetes mellitus with circulatory disorder ([facility]12/14/2016  . Intracranial vascular stenosis 01/22/2015  . Aneurysm ([facility]  . Headache   . Diabetes mellitus with complication ([facility]1059/56/3875. Essential hypertension 11/12/2014  . Dependent edema 11/12/2014  . Peripheral neuropathy 11/12/2014  . Depression 11/12/2014  . Facial droop   .  Chest pain   . TIA (transient ischemic attack) 11/11/2014  . Dyspnea 08/06/2014  . Hyperthyroidism 08/06/2014  . Meniere's disease 06/07/2014  . History of CHF (congestive heart failure) 06/07/2014  . Essential hypertension, benign 06/13/2013  . Goiter  06/13/2013  . Obesity, unspecified 06/13/2013    Past Medical History:  Diagnosis Date  . Asthma   . Cerebral aneurysm   . Complication of anesthesia HYPOTENSION INTRAOP W/ HYSTERECTOMY IN 1987--  DID OK W/ TOTAL KNEE IN 2008  . Diabetes mellitus   . DJD (degenerative joint disease)   . Dyslipidemia   . GERD (gastroesophageal reflux disease)   . H/O hiatal hernia   . History of CHF (congestive heart failure) 2010-  RESOLVED  . History of peptic ulcer YRS AGO  . Hypertension   . Meniere's disease   . Mitral valve prolapse occasional palpitations w/ chest discomfort  . OA (osteoarthritis)   . Rotator cuff tear, left   . Stroke [facility])     Past Surgical History:  Procedure Laterality Date  . BUNIONECTOMY  2007   LEFT FOOT  . LEFT SHOULDER SURGERY  1997   ROTATOR CUFF REPAIR  . SHOULDER ARTHROSCOPY  09/20/2011   Procedure: ARTHROSCOPY SHOULDER;  Surgeon: Johnn Hai, MD;  Location[facility];  Service: Orthopedics;  Laterality: Left;  SAD AND MINI OPEN ROTATOR CUFF REPAIR    . TOTAL KNEE ARTHROPLASTY  09-12-2006  [facility]   RIGHT KNEE  . TOTAL KNEE ARTHROPLASTY Left 11/30/2017   Procedure: LEFT TOTAL KNEE REPLACEMENT;  Surgeon: [name], MD;  Location: [place];  Service: Orthopedics;  Laterality: Left;  . TUBAL LIGATION  1976  . TYMPANOPLASTY  1990;   1980  . VAGINAL HYSTERECTOMY  1987    Social History   Tobacco Use  . Smoking status: Never Smoker  . Smokeless tobacco: Never Used  Substance Use Topics  . Alcohol use: No  . Drug use: No    Family History  Problem Relation Age of Onset  . Heart disease Mother        in her 18s, heart attack  . Diabetes Mother   . Kidney disease Mother        dialysis  . Thyroid disease Mother   . Aneurysm Mother   . Ovarian cancer Mother   . Breast cancer Mother   . Thyroid disease Sister   . Heart disease Sister   . Lymphoma Father   . Diabetes Father   . Hypertension Father   . Congestive Heart  Failure Father   . Thyroid disease Brother   . Thyroid disease Paternal Grandmother   . Heart attack Sister        at age 32  . Stroke Sister   . Hypertension Sister   . Breast cancer Maternal Aunt   . Breast cancer Maternal Grandmother   . Breast cancer Maternal Aunt   . Breast cancer Maternal Aunt   . Other Brother        Musician accident  . Bipolar disorder Son   . Other Son        GSW    Allergies  Allergen Reactions  . Influenza Vaccines Anaphylaxis  . Demerol Other (See Comments)    SEIZURE  . Nsaids Other (See Comments)    NAPROXEN, IBUPROFEN, SKELAXIN---  CAUSES PALPITATIONS  . Prednisone Other (See Comments)    Caused her glucose to go up to 500 and went to ED  . Tramadol Other (  See Comments)    Contraindicated with Victoza   . Codeine Itching  . Mango Flavor [Flavoring Agent] Itching    Medication list has been reviewed and updated.  Current Outpatient Medications on File Prior to Visit  Medication Sig Dispense Refill  . albuterol (PROAIR HFA) 108 (90 Base) MCG/ACT inhaler Inhale 1-2 puffs into the lungs every 6 (six) hours as needed for wheezing or shortness of breath. 18 g 6  . albuterol (PROVENTIL) (2.5 MG/3ML) 0.083% nebulizer solution Take 3 mLs (2.5 mg total) by nebulization every 6 (six) hours as needed for wheezing or shortness of breath. 150 mL 1  . aspirin (BAYER ASPIRIN) 325 MG tablet Take 1 tablet (325 mg total) by mouth daily. 30 tablet   . Bioflavonoid Products (VITAMIN C) CHEW Chew 2 each by mouth daily.    . cetirizine (ZYRTEC) 10 MG tablet Take 1 tablet (10 mg total) by mouth daily. 90 tablet 3  . clopidogrel (PLAVIX) 75 MG tablet TAKE 1 TABLET BY MOUTH  DAILY (Patient taking differently: Take 75 mg by mouth daily. ) 90 tablet 3  . docusate sodium (COLACE) 100 MG capsule Take 1 capsule (100 mg total) by mouth daily. 10 capsule 2  . empagliflozin (JARDIANCE) 10 MG TABS tablet TAKE 10 mg TABLET BY MOUTH  DAILY (Patient taking differently: Take 5 mg by  mouth daily. ) 90 tablet 0  . famotidine (PEPCID) 10 MG tablet Take 10 mg by mouth daily.    . fluticasone (FLOVENT HFA) 220 MCG/ACT inhaler Inhale 1 puff into the lungs 2 (two) times daily. May increase to 2 puffs if needed (Patient taking differently: Inhale 2 puffs into the lungs 2 (two) times daily as needed (for shorness of breath or wheezing). ) 1 Inhaler 12  . furosemide (LASIX) 20 MG tablet Take 1 tablet (20 mg total) by mouth daily. 30 tablet 5  . gabapentin (NEURONTIN) 300 MG capsule TAKE 2 CAPSULES BY MOUTH  TWO TIMES DAILY (Patient taking differently: Take 600 mg by mouth 2 (two) times daily. ) 360 capsule 1  . ipratropium (ATROVENT) 0.03 % nasal spray Place 2 sprays into the nose 4 (four) times daily. (Patient taking differently: Place 2 sprays into the nose 2 (two) times daily as needed for rhinitis. ) 60 mL 0  . Lancets Misc. (ACCU-CHEK SOFTCLIX LANCET DEV) KIT USE AS DIRECTED 1 kit 0  . linagliptin (TRADJENTA) 5 MG TABS tablet Take 0.5 tablets (2.5 mg total) by mouth daily. 45 tablet 0  . lisinopril (PRINIVIL,ZESTRIL) 20 MG tablet Take 1 tablet (20 mg total) by mouth daily. 90 tablet 0  . metFORMIN (GLUCOPHAGE) 500 MG tablet Take 2 tablets (1,000 mg total) by mouth 2 (two) times daily. TAKE 2 TABLETS(1000 MG) BY MOUTH TWICE DAILY WITH A MEAL (Patient taking differently: Take 1,000 mg by mouth 2 (two) times daily. ) 360 tablet 3  . methocarbamol (ROBAXIN) 500 MG tablet Take 1 tablet (500 mg total) by mouth 3 (three) times daily. 40 tablet 0  . metoprolol tartrate (LOPRESSOR) 25 MG tablet Take 1 tablet (25 mg total) by mouth 2 (two) times daily. 180 tablet 0  . montelukast (SINGULAIR) 10 MG tablet Take 1 tablet (10 mg total) by mouth at bedtime. 30 tablet 3  . Multiple Vitamins-Minerals (MULTIVITAMIN PO) Take 2 tablets by mouth daily.    Marland Kitchen oxyCODONE-acetaminophen (PERCOCET) 5-325 MG tablet Take 1 tablet by mouth every 4 (four) hours as needed for severe pain. 40 tablet 0  . QUEtiapine  (  SEROQUEL) 100 MG tablet Take 0.5 tablets (50 mg total) by mouth at bedtime. 45 tablet 3  . sertraline (ZOLOFT) 50 MG tablet TAKE ONE-HALF TABLET BY  MOUTH AT BEDTIME (Patient taking differently: Take 25 mg by mouth at bedtime. ) 45 tablet 3   No current facility-administered medications on file prior to visit.     Review of Systems:  As per HPI- otherwise negative.   Physical Examination: There were no vitals filed for this visit. There were no vitals filed for this visit. There is no height or weight on file to calculate BMI. Ideal Body Weight:    GEN: WDWN, NAD, Non-toxic, A & O x 3 HEENT: Atraumatic, Normocephalic. Neck supple. No masses, No LAD. Ears and Nose: No external deformity. CV: RRR, No M/G/R. No JVD. No thrill. No extra heart sounds. PULM: CTA B, no wheezes, crackles, rhonchi. No retractions. No resp. distress. No accessory muscle use. ABD: S, NT, ND, +BS. No rebound. No HSM. EXTR: No c/c/e NEURO Normal gait.  PSYCH: Normally interactive. Conversant. Not depressed or anxious appearing.  Calm demeanor.    Assessment and Plan: ***  Signed [name], MD

## 2017-12-12 DIAGNOSIS — I341 Nonrheumatic mitral (valve) prolapse: Secondary | ICD-10-CM | POA: Diagnosis not present

## 2017-12-12 DIAGNOSIS — Z7982 Long term (current) use of aspirin: Secondary | ICD-10-CM | POA: Diagnosis not present

## 2017-12-12 DIAGNOSIS — Z7984 Long term (current) use of oral hypoglycemic drugs: Secondary | ICD-10-CM | POA: Diagnosis not present

## 2017-12-12 DIAGNOSIS — E049 Nontoxic goiter, unspecified: Secondary | ICD-10-CM | POA: Diagnosis not present

## 2017-12-12 DIAGNOSIS — I110 Hypertensive heart disease with heart failure: Secondary | ICD-10-CM | POA: Diagnosis not present

## 2017-12-12 DIAGNOSIS — Z96653 Presence of artificial knee joint, bilateral: Secondary | ICD-10-CM | POA: Diagnosis not present

## 2017-12-12 DIAGNOSIS — G5603 Carpal tunnel syndrome, bilateral upper limbs: Secondary | ICD-10-CM | POA: Diagnosis not present

## 2017-12-12 DIAGNOSIS — M47812 Spondylosis without myelopathy or radiculopathy, cervical region: Secondary | ICD-10-CM | POA: Diagnosis not present

## 2017-12-12 DIAGNOSIS — E1142 Type 2 diabetes mellitus with diabetic polyneuropathy: Secondary | ICD-10-CM | POA: Diagnosis not present

## 2017-12-12 DIAGNOSIS — H8109 Meniere's disease, unspecified ear: Secondary | ICD-10-CM | POA: Diagnosis not present

## 2017-12-12 DIAGNOSIS — I509 Heart failure, unspecified: Secondary | ICD-10-CM | POA: Diagnosis not present

## 2017-12-12 DIAGNOSIS — Z8673 Personal history of transient ischemic attack (TIA), and cerebral infarction without residual deficits: Secondary | ICD-10-CM | POA: Diagnosis not present

## 2017-12-12 DIAGNOSIS — E7800 Pure hypercholesterolemia, unspecified: Secondary | ICD-10-CM | POA: Diagnosis not present

## 2017-12-12 DIAGNOSIS — Z471 Aftercare following joint replacement surgery: Secondary | ICD-10-CM | POA: Diagnosis not present

## 2017-12-12 DIAGNOSIS — Z9181 History of falling: Secondary | ICD-10-CM | POA: Diagnosis not present

## 2017-12-13 ENCOUNTER — Other Ambulatory Visit (INDEPENDENT_AMBULATORY_CARE_PROVIDER_SITE_OTHER): Payer: Self-pay

## 2017-12-13 ENCOUNTER — Telehealth (INDEPENDENT_AMBULATORY_CARE_PROVIDER_SITE_OTHER): Payer: Self-pay

## 2017-12-13 MED ORDER — HYDROCODONE-ACETAMINOPHEN 5-325 MG PO TABS: ORAL_TABLET | ORAL | 0 refills | Status: DC

## 2017-12-13 NOTE — Telephone Encounter (Signed)
Message left on triage phone. Pt states she is in a lot of pain and tylenol and flexeril are not helping. Requesting something stronger/

## 2017-12-13 NOTE — Telephone Encounter (Signed)
Ok norco 5/325  # 30  One po tid prn pain thanks .  Print it I will sign and she can have someone pick it up thanks

## 2017-12-13 NOTE — Telephone Encounter (Signed)
Talked with patient and advised her that her Rx is ready to be picked up at the front desk.

## 2017-12-13 NOTE — Telephone Encounter (Signed)
Please advise on message below for a stronger pain medicine.  Thank You

## 2017-12-14 ENCOUNTER — Ambulatory Visit (INDEPENDENT_AMBULATORY_CARE_PROVIDER_SITE_OTHER): Payer: Medicare Other | Admitting: Surgery

## 2017-12-14 ENCOUNTER — Encounter (INDEPENDENT_AMBULATORY_CARE_PROVIDER_SITE_OTHER): Payer: Self-pay | Admitting: Surgery

## 2017-12-14 ENCOUNTER — Encounter (INDEPENDENT_AMBULATORY_CARE_PROVIDER_SITE_OTHER): Payer: Self-pay

## 2017-12-14 ENCOUNTER — Inpatient Hospital Stay (INDEPENDENT_AMBULATORY_CARE_PROVIDER_SITE_OTHER): Payer: Medicare Other | Admitting: Orthopaedic Surgery

## 2017-12-14 ENCOUNTER — Ambulatory Visit (INDEPENDENT_AMBULATORY_CARE_PROVIDER_SITE_OTHER): Payer: Self-pay

## 2017-12-14 DIAGNOSIS — Z7984 Long term (current) use of oral hypoglycemic drugs: Secondary | ICD-10-CM | POA: Diagnosis not present

## 2017-12-14 DIAGNOSIS — G8929 Other chronic pain: Secondary | ICD-10-CM

## 2017-12-14 DIAGNOSIS — H8109 Meniere's disease, unspecified ear: Secondary | ICD-10-CM | POA: Diagnosis not present

## 2017-12-14 DIAGNOSIS — E049 Nontoxic goiter, unspecified: Secondary | ICD-10-CM | POA: Diagnosis not present

## 2017-12-14 DIAGNOSIS — Z471 Aftercare following joint replacement surgery: Secondary | ICD-10-CM | POA: Diagnosis not present

## 2017-12-14 DIAGNOSIS — I341 Nonrheumatic mitral (valve) prolapse: Secondary | ICD-10-CM | POA: Diagnosis not present

## 2017-12-14 DIAGNOSIS — Z96653 Presence of artificial knee joint, bilateral: Secondary | ICD-10-CM | POA: Diagnosis not present

## 2017-12-14 DIAGNOSIS — Z9181 History of falling: Secondary | ICD-10-CM | POA: Diagnosis not present

## 2017-12-14 DIAGNOSIS — G5603 Carpal tunnel syndrome, bilateral upper limbs: Secondary | ICD-10-CM | POA: Diagnosis not present

## 2017-12-14 DIAGNOSIS — Z96652 Presence of left artificial knee joint: Secondary | ICD-10-CM

## 2017-12-14 DIAGNOSIS — M25562 Pain in left knee: Secondary | ICD-10-CM

## 2017-12-14 DIAGNOSIS — E7800 Pure hypercholesterolemia, unspecified: Secondary | ICD-10-CM | POA: Diagnosis not present

## 2017-12-14 DIAGNOSIS — E1142 Type 2 diabetes mellitus with diabetic polyneuropathy: Secondary | ICD-10-CM | POA: Diagnosis not present

## 2017-12-14 DIAGNOSIS — Z7982 Long term (current) use of aspirin: Secondary | ICD-10-CM | POA: Diagnosis not present

## 2017-12-14 DIAGNOSIS — M47812 Spondylosis without myelopathy or radiculopathy, cervical region: Secondary | ICD-10-CM | POA: Diagnosis not present

## 2017-12-14 DIAGNOSIS — I110 Hypertensive heart disease with heart failure: Secondary | ICD-10-CM | POA: Diagnosis not present

## 2017-12-14 DIAGNOSIS — Z8673 Personal history of transient ischemic attack (TIA), and cerebral infarction without residual deficits: Secondary | ICD-10-CM | POA: Diagnosis not present

## 2017-12-14 DIAGNOSIS — I509 Heart failure, unspecified: Secondary | ICD-10-CM | POA: Diagnosis not present

## 2017-12-14 NOTE — Progress Notes (Signed)
67 year old white female who is 2-week status post left total knee replacement returns.  States that her knee is doing well and she is progressing with rehab.  Patient brought in paperwork from [place] home health PT and they are recommending discharge to outpatient therapy this Friday.  Patient has very understandable concern considering that she had to take an Melburn Popper to her appointment with me this morning.  She does not have reliable assistance at home to offer transportation and she will not be driving until at least 6 weeks postop.  Exam Very pleasant white female alert and oriented in no acute distress.  Knee range of motion about 5 to 75 degrees.  Today staples removed insertion was applied.  Incision healing well without signs of infection.  No drainage.  Calf is nontender.  Neurovascular intact.   Assessment 2 weeks status post left total knee replacement.   Plan I recommend that patient have at least another 3 to 4 weeks of home health PT since she does not have reliable assistance for transportation.  I do not feel that is appropriate for patient to take uber's to her appointments.  I did speak with Sonia Side with [facility] and advised him of the need to continue home health PT.  Patient will follow-up me in 4 weeks for recheck.

## 2017-12-15 DIAGNOSIS — M47812 Spondylosis without myelopathy or radiculopathy, cervical region: Secondary | ICD-10-CM | POA: Diagnosis not present

## 2017-12-15 DIAGNOSIS — I110 Hypertensive heart disease with heart failure: Secondary | ICD-10-CM | POA: Diagnosis not present

## 2017-12-15 DIAGNOSIS — E7800 Pure hypercholesterolemia, unspecified: Secondary | ICD-10-CM | POA: Diagnosis not present

## 2017-12-15 DIAGNOSIS — E1142 Type 2 diabetes mellitus with diabetic polyneuropathy: Secondary | ICD-10-CM | POA: Diagnosis not present

## 2017-12-15 DIAGNOSIS — Z7982 Long term (current) use of aspirin: Secondary | ICD-10-CM | POA: Diagnosis not present

## 2017-12-15 DIAGNOSIS — H8109 Meniere's disease, unspecified ear: Secondary | ICD-10-CM | POA: Diagnosis not present

## 2017-12-15 DIAGNOSIS — E049 Nontoxic goiter, unspecified: Secondary | ICD-10-CM | POA: Diagnosis not present

## 2017-12-15 DIAGNOSIS — Z471 Aftercare following joint replacement surgery: Secondary | ICD-10-CM | POA: Diagnosis not present

## 2017-12-15 DIAGNOSIS — I509 Heart failure, unspecified: Secondary | ICD-10-CM | POA: Diagnosis not present

## 2017-12-15 DIAGNOSIS — Z96653 Presence of artificial knee joint, bilateral: Secondary | ICD-10-CM | POA: Diagnosis not present

## 2017-12-15 DIAGNOSIS — G5603 Carpal tunnel syndrome, bilateral upper limbs: Secondary | ICD-10-CM | POA: Diagnosis not present

## 2017-12-15 DIAGNOSIS — Z9181 History of falling: Secondary | ICD-10-CM | POA: Diagnosis not present

## 2017-12-15 DIAGNOSIS — Z8673 Personal history of transient ischemic attack (TIA), and cerebral infarction without residual deficits: Secondary | ICD-10-CM | POA: Diagnosis not present

## 2017-12-15 DIAGNOSIS — Z7984 Long term (current) use of oral hypoglycemic drugs: Secondary | ICD-10-CM | POA: Diagnosis not present

## 2017-12-15 DIAGNOSIS — I341 Nonrheumatic mitral (valve) prolapse: Secondary | ICD-10-CM | POA: Diagnosis not present

## 2017-12-19 ENCOUNTER — Telehealth (INDEPENDENT_AMBULATORY_CARE_PROVIDER_SITE_OTHER): Payer: Self-pay | Admitting: *Deleted

## 2017-12-19 ENCOUNTER — Ambulatory Visit: Payer: Medicare Other | Admitting: Family Medicine

## 2017-12-19 DIAGNOSIS — E049 Nontoxic goiter, unspecified: Secondary | ICD-10-CM | POA: Diagnosis not present

## 2017-12-19 DIAGNOSIS — Z96653 Presence of artificial knee joint, bilateral: Secondary | ICD-10-CM | POA: Diagnosis not present

## 2017-12-19 DIAGNOSIS — I341 Nonrheumatic mitral (valve) prolapse: Secondary | ICD-10-CM | POA: Diagnosis not present

## 2017-12-19 DIAGNOSIS — Z7984 Long term (current) use of oral hypoglycemic drugs: Secondary | ICD-10-CM | POA: Diagnosis not present

## 2017-12-19 DIAGNOSIS — M47812 Spondylosis without myelopathy or radiculopathy, cervical region: Secondary | ICD-10-CM | POA: Diagnosis not present

## 2017-12-19 DIAGNOSIS — E1142 Type 2 diabetes mellitus with diabetic polyneuropathy: Secondary | ICD-10-CM | POA: Diagnosis not present

## 2017-12-19 DIAGNOSIS — Z7982 Long term (current) use of aspirin: Secondary | ICD-10-CM | POA: Diagnosis not present

## 2017-12-19 DIAGNOSIS — H8109 Meniere's disease, unspecified ear: Secondary | ICD-10-CM | POA: Diagnosis not present

## 2017-12-19 DIAGNOSIS — Z8673 Personal history of transient ischemic attack (TIA), and cerebral infarction without residual deficits: Secondary | ICD-10-CM | POA: Diagnosis not present

## 2017-12-19 DIAGNOSIS — I110 Hypertensive heart disease with heart failure: Secondary | ICD-10-CM | POA: Diagnosis not present

## 2017-12-19 DIAGNOSIS — I509 Heart failure, unspecified: Secondary | ICD-10-CM | POA: Diagnosis not present

## 2017-12-19 DIAGNOSIS — E7800 Pure hypercholesterolemia, unspecified: Secondary | ICD-10-CM | POA: Diagnosis not present

## 2017-12-19 DIAGNOSIS — G5603 Carpal tunnel syndrome, bilateral upper limbs: Secondary | ICD-10-CM | POA: Diagnosis not present

## 2017-12-19 DIAGNOSIS — Z9181 History of falling: Secondary | ICD-10-CM | POA: Diagnosis not present

## 2017-12-19 DIAGNOSIS — Z471 Aftercare following joint replacement surgery: Secondary | ICD-10-CM | POA: Diagnosis not present

## 2017-12-19 NOTE — Telephone Encounter (Signed)
Received call from Dyann Ruddle PT with [facility] at home wanting to give FYI about pt BP= pt bp has been up at 158/84 today, and is also c/o elevated knee pain and could not make appt today d/t pain. Pt states to call her with any instructions in regarding Pain and BP.   [phone]

## 2017-12-19 NOTE — Telephone Encounter (Signed)
I CALLED, discussed.  She is having trouble and has extension lag cannot lift her leg.  She has not been doing the exercises I talked to her about when she was in the hospital with squishing the tomato, working on quad tightening working on the bending crossing her ankles and next to work required to help with her pain with the therapist is not with her.  She will double down on this and work on her knee rehab as instructed.  Follow-up as scheduled.

## 2017-12-19 NOTE — Telephone Encounter (Signed)
FYI. Please advise.

## 2017-12-20 NOTE — Progress Notes (Signed)
67 year old female history of end-stage arthritis left knee comes in for preop evaluation.  Symptoms unchanged from previous visit.  She is willing to proceed with total knee replacement as scheduled.  Full history and physical performed.  All questions answered.

## 2017-12-21 ENCOUNTER — Other Ambulatory Visit: Payer: Self-pay | Admitting: Family Medicine

## 2017-12-21 ENCOUNTER — Encounter: Payer: Self-pay | Admitting: Family Medicine

## 2017-12-21 DIAGNOSIS — E118 Type 2 diabetes mellitus with unspecified complications: Secondary | ICD-10-CM

## 2017-12-21 MED FILL — LISINOPRIL 20 MG TABLET: 20 | 90 days supply | Qty: 90 | Fill #0

## 2017-12-21 MED FILL — QUETIAPINE FUMARATE 100 MG: 100 | 90 days supply | Qty: 45 | Fill #1

## 2017-12-21 MED FILL — TRADJENTA 5 MG TABLET: 5 | 90 days supply | Qty: 45 | Fill #0

## 2017-12-21 MED FILL — SERTRALINE HCL 50 MG TABLET: 50 | 90 days supply | Qty: 45 | Fill #1

## 2017-12-21 MED FILL — METOPROLOL TARTRATE 25 MG T: 25 | 90 days supply | Qty: 180 | Fill #0

## 2017-12-22 DIAGNOSIS — I509 Heart failure, unspecified: Secondary | ICD-10-CM | POA: Diagnosis not present

## 2017-12-22 DIAGNOSIS — I110 Hypertensive heart disease with heart failure: Secondary | ICD-10-CM | POA: Diagnosis not present

## 2017-12-22 DIAGNOSIS — Z471 Aftercare following joint replacement surgery: Secondary | ICD-10-CM | POA: Diagnosis not present

## 2017-12-22 DIAGNOSIS — I341 Nonrheumatic mitral (valve) prolapse: Secondary | ICD-10-CM | POA: Diagnosis not present

## 2017-12-22 DIAGNOSIS — Z7984 Long term (current) use of oral hypoglycemic drugs: Secondary | ICD-10-CM | POA: Diagnosis not present

## 2017-12-22 DIAGNOSIS — E1142 Type 2 diabetes mellitus with diabetic polyneuropathy: Secondary | ICD-10-CM | POA: Diagnosis not present

## 2017-12-22 DIAGNOSIS — Z96653 Presence of artificial knee joint, bilateral: Secondary | ICD-10-CM | POA: Diagnosis not present

## 2017-12-22 DIAGNOSIS — Z8673 Personal history of transient ischemic attack (TIA), and cerebral infarction without residual deficits: Secondary | ICD-10-CM | POA: Diagnosis not present

## 2017-12-22 DIAGNOSIS — M47812 Spondylosis without myelopathy or radiculopathy, cervical region: Secondary | ICD-10-CM | POA: Diagnosis not present

## 2017-12-22 DIAGNOSIS — E7800 Pure hypercholesterolemia, unspecified: Secondary | ICD-10-CM | POA: Diagnosis not present

## 2017-12-22 DIAGNOSIS — G5603 Carpal tunnel syndrome, bilateral upper limbs: Secondary | ICD-10-CM | POA: Diagnosis not present

## 2017-12-22 DIAGNOSIS — Z7982 Long term (current) use of aspirin: Secondary | ICD-10-CM | POA: Diagnosis not present

## 2017-12-22 DIAGNOSIS — Z9181 History of falling: Secondary | ICD-10-CM | POA: Diagnosis not present

## 2017-12-22 DIAGNOSIS — E049 Nontoxic goiter, unspecified: Secondary | ICD-10-CM | POA: Diagnosis not present

## 2017-12-22 DIAGNOSIS — H8109 Meniere's disease, unspecified ear: Secondary | ICD-10-CM | POA: Diagnosis not present

## 2017-12-23 DIAGNOSIS — E7800 Pure hypercholesterolemia, unspecified: Secondary | ICD-10-CM | POA: Diagnosis not present

## 2017-12-23 DIAGNOSIS — E1142 Type 2 diabetes mellitus with diabetic polyneuropathy: Secondary | ICD-10-CM | POA: Diagnosis not present

## 2017-12-23 DIAGNOSIS — E049 Nontoxic goiter, unspecified: Secondary | ICD-10-CM | POA: Diagnosis not present

## 2017-12-23 DIAGNOSIS — G5603 Carpal tunnel syndrome, bilateral upper limbs: Secondary | ICD-10-CM | POA: Diagnosis not present

## 2017-12-23 DIAGNOSIS — Z7982 Long term (current) use of aspirin: Secondary | ICD-10-CM | POA: Diagnosis not present

## 2017-12-23 DIAGNOSIS — H8109 Meniere's disease, unspecified ear: Secondary | ICD-10-CM | POA: Diagnosis not present

## 2017-12-23 DIAGNOSIS — M47812 Spondylosis without myelopathy or radiculopathy, cervical region: Secondary | ICD-10-CM | POA: Diagnosis not present

## 2017-12-23 DIAGNOSIS — Z9181 History of falling: Secondary | ICD-10-CM | POA: Diagnosis not present

## 2017-12-23 DIAGNOSIS — I341 Nonrheumatic mitral (valve) prolapse: Secondary | ICD-10-CM | POA: Diagnosis not present

## 2017-12-23 DIAGNOSIS — Z96653 Presence of artificial knee joint, bilateral: Secondary | ICD-10-CM | POA: Diagnosis not present

## 2017-12-23 DIAGNOSIS — I110 Hypertensive heart disease with heart failure: Secondary | ICD-10-CM | POA: Diagnosis not present

## 2017-12-23 DIAGNOSIS — Z471 Aftercare following joint replacement surgery: Secondary | ICD-10-CM | POA: Diagnosis not present

## 2017-12-23 DIAGNOSIS — I509 Heart failure, unspecified: Secondary | ICD-10-CM | POA: Diagnosis not present

## 2017-12-23 DIAGNOSIS — Z7984 Long term (current) use of oral hypoglycemic drugs: Secondary | ICD-10-CM | POA: Diagnosis not present

## 2017-12-23 DIAGNOSIS — Z8673 Personal history of transient ischemic attack (TIA), and cerebral infarction without residual deficits: Secondary | ICD-10-CM | POA: Diagnosis not present

## 2017-12-26 DIAGNOSIS — I110 Hypertensive heart disease with heart failure: Secondary | ICD-10-CM | POA: Diagnosis not present

## 2017-12-26 DIAGNOSIS — Z96653 Presence of artificial knee joint, bilateral: Secondary | ICD-10-CM | POA: Diagnosis not present

## 2017-12-26 DIAGNOSIS — G5603 Carpal tunnel syndrome, bilateral upper limbs: Secondary | ICD-10-CM | POA: Diagnosis not present

## 2017-12-26 DIAGNOSIS — Z8673 Personal history of transient ischemic attack (TIA), and cerebral infarction without residual deficits: Secondary | ICD-10-CM | POA: Diagnosis not present

## 2017-12-26 DIAGNOSIS — E1142 Type 2 diabetes mellitus with diabetic polyneuropathy: Secondary | ICD-10-CM | POA: Diagnosis not present

## 2017-12-26 DIAGNOSIS — Z9181 History of falling: Secondary | ICD-10-CM | POA: Diagnosis not present

## 2017-12-26 DIAGNOSIS — E049 Nontoxic goiter, unspecified: Secondary | ICD-10-CM | POA: Diagnosis not present

## 2017-12-26 DIAGNOSIS — M47812 Spondylosis without myelopathy or radiculopathy, cervical region: Secondary | ICD-10-CM | POA: Diagnosis not present

## 2017-12-26 DIAGNOSIS — H8109 Meniere's disease, unspecified ear: Secondary | ICD-10-CM | POA: Diagnosis not present

## 2017-12-26 DIAGNOSIS — E7800 Pure hypercholesterolemia, unspecified: Secondary | ICD-10-CM | POA: Diagnosis not present

## 2017-12-26 DIAGNOSIS — Z471 Aftercare following joint replacement surgery: Secondary | ICD-10-CM | POA: Diagnosis not present

## 2017-12-26 DIAGNOSIS — Z7984 Long term (current) use of oral hypoglycemic drugs: Secondary | ICD-10-CM | POA: Diagnosis not present

## 2017-12-26 DIAGNOSIS — Z7982 Long term (current) use of aspirin: Secondary | ICD-10-CM | POA: Diagnosis not present

## 2017-12-26 DIAGNOSIS — I341 Nonrheumatic mitral (valve) prolapse: Secondary | ICD-10-CM | POA: Diagnosis not present

## 2017-12-26 DIAGNOSIS — I509 Heart failure, unspecified: Secondary | ICD-10-CM | POA: Diagnosis not present

## 2017-12-27 DIAGNOSIS — Z7982 Long term (current) use of aspirin: Secondary | ICD-10-CM | POA: Diagnosis not present

## 2017-12-27 DIAGNOSIS — M47812 Spondylosis without myelopathy or radiculopathy, cervical region: Secondary | ICD-10-CM | POA: Diagnosis not present

## 2017-12-27 DIAGNOSIS — Z7984 Long term (current) use of oral hypoglycemic drugs: Secondary | ICD-10-CM | POA: Diagnosis not present

## 2017-12-27 DIAGNOSIS — Z471 Aftercare following joint replacement surgery: Secondary | ICD-10-CM | POA: Diagnosis not present

## 2017-12-27 DIAGNOSIS — Z8673 Personal history of transient ischemic attack (TIA), and cerebral infarction without residual deficits: Secondary | ICD-10-CM | POA: Diagnosis not present

## 2017-12-27 DIAGNOSIS — G5603 Carpal tunnel syndrome, bilateral upper limbs: Secondary | ICD-10-CM | POA: Diagnosis not present

## 2017-12-27 DIAGNOSIS — E1142 Type 2 diabetes mellitus with diabetic polyneuropathy: Secondary | ICD-10-CM | POA: Diagnosis not present

## 2017-12-27 DIAGNOSIS — I110 Hypertensive heart disease with heart failure: Secondary | ICD-10-CM | POA: Diagnosis not present

## 2017-12-27 DIAGNOSIS — Z96653 Presence of artificial knee joint, bilateral: Secondary | ICD-10-CM | POA: Diagnosis not present

## 2017-12-27 DIAGNOSIS — Z9181 History of falling: Secondary | ICD-10-CM | POA: Diagnosis not present

## 2017-12-27 DIAGNOSIS — E7800 Pure hypercholesterolemia, unspecified: Secondary | ICD-10-CM | POA: Diagnosis not present

## 2017-12-27 DIAGNOSIS — H8109 Meniere's disease, unspecified ear: Secondary | ICD-10-CM | POA: Diagnosis not present

## 2017-12-27 DIAGNOSIS — I341 Nonrheumatic mitral (valve) prolapse: Secondary | ICD-10-CM | POA: Diagnosis not present

## 2017-12-27 DIAGNOSIS — I509 Heart failure, unspecified: Secondary | ICD-10-CM | POA: Diagnosis not present

## 2017-12-27 DIAGNOSIS — E049 Nontoxic goiter, unspecified: Secondary | ICD-10-CM | POA: Diagnosis not present

## 2017-12-28 DIAGNOSIS — H8109 Meniere's disease, unspecified ear: Secondary | ICD-10-CM | POA: Diagnosis not present

## 2017-12-28 DIAGNOSIS — Z9181 History of falling: Secondary | ICD-10-CM | POA: Diagnosis not present

## 2017-12-28 DIAGNOSIS — E1142 Type 2 diabetes mellitus with diabetic polyneuropathy: Secondary | ICD-10-CM | POA: Diagnosis not present

## 2017-12-28 DIAGNOSIS — Z471 Aftercare following joint replacement surgery: Secondary | ICD-10-CM | POA: Diagnosis not present

## 2017-12-28 DIAGNOSIS — Z96653 Presence of artificial knee joint, bilateral: Secondary | ICD-10-CM | POA: Diagnosis not present

## 2017-12-28 DIAGNOSIS — I341 Nonrheumatic mitral (valve) prolapse: Secondary | ICD-10-CM | POA: Diagnosis not present

## 2017-12-28 DIAGNOSIS — M47812 Spondylosis without myelopathy or radiculopathy, cervical region: Secondary | ICD-10-CM | POA: Diagnosis not present

## 2017-12-28 DIAGNOSIS — Z7982 Long term (current) use of aspirin: Secondary | ICD-10-CM | POA: Diagnosis not present

## 2017-12-28 DIAGNOSIS — Z7984 Long term (current) use of oral hypoglycemic drugs: Secondary | ICD-10-CM | POA: Diagnosis not present

## 2017-12-28 DIAGNOSIS — G5603 Carpal tunnel syndrome, bilateral upper limbs: Secondary | ICD-10-CM | POA: Diagnosis not present

## 2017-12-28 DIAGNOSIS — I110 Hypertensive heart disease with heart failure: Secondary | ICD-10-CM | POA: Diagnosis not present

## 2017-12-28 DIAGNOSIS — E049 Nontoxic goiter, unspecified: Secondary | ICD-10-CM | POA: Diagnosis not present

## 2017-12-28 DIAGNOSIS — E7800 Pure hypercholesterolemia, unspecified: Secondary | ICD-10-CM | POA: Diagnosis not present

## 2017-12-28 DIAGNOSIS — I509 Heart failure, unspecified: Secondary | ICD-10-CM | POA: Diagnosis not present

## 2017-12-28 DIAGNOSIS — Z8673 Personal history of transient ischemic attack (TIA), and cerebral infarction without residual deficits: Secondary | ICD-10-CM | POA: Diagnosis not present

## 2017-12-30 DIAGNOSIS — I509 Heart failure, unspecified: Secondary | ICD-10-CM | POA: Diagnosis not present

## 2017-12-30 DIAGNOSIS — I341 Nonrheumatic mitral (valve) prolapse: Secondary | ICD-10-CM | POA: Diagnosis not present

## 2017-12-30 DIAGNOSIS — E1142 Type 2 diabetes mellitus with diabetic polyneuropathy: Secondary | ICD-10-CM | POA: Diagnosis not present

## 2017-12-30 DIAGNOSIS — Z471 Aftercare following joint replacement surgery: Secondary | ICD-10-CM | POA: Diagnosis not present

## 2017-12-30 DIAGNOSIS — Z7984 Long term (current) use of oral hypoglycemic drugs: Secondary | ICD-10-CM | POA: Diagnosis not present

## 2017-12-30 DIAGNOSIS — Z8673 Personal history of transient ischemic attack (TIA), and cerebral infarction without residual deficits: Secondary | ICD-10-CM | POA: Diagnosis not present

## 2017-12-30 DIAGNOSIS — E7800 Pure hypercholesterolemia, unspecified: Secondary | ICD-10-CM | POA: Diagnosis not present

## 2017-12-30 DIAGNOSIS — Z96653 Presence of artificial knee joint, bilateral: Secondary | ICD-10-CM | POA: Diagnosis not present

## 2017-12-30 DIAGNOSIS — M47812 Spondylosis without myelopathy or radiculopathy, cervical region: Secondary | ICD-10-CM | POA: Diagnosis not present

## 2017-12-30 DIAGNOSIS — H8109 Meniere's disease, unspecified ear: Secondary | ICD-10-CM | POA: Diagnosis not present

## 2017-12-30 DIAGNOSIS — Z9181 History of falling: Secondary | ICD-10-CM | POA: Diagnosis not present

## 2017-12-30 DIAGNOSIS — I110 Hypertensive heart disease with heart failure: Secondary | ICD-10-CM | POA: Diagnosis not present

## 2017-12-30 DIAGNOSIS — Z7982 Long term (current) use of aspirin: Secondary | ICD-10-CM | POA: Diagnosis not present

## 2017-12-30 DIAGNOSIS — E049 Nontoxic goiter, unspecified: Secondary | ICD-10-CM | POA: Diagnosis not present

## 2017-12-30 DIAGNOSIS — G5603 Carpal tunnel syndrome, bilateral upper limbs: Secondary | ICD-10-CM | POA: Diagnosis not present

## 2018-01-02 DIAGNOSIS — Z9181 History of falling: Secondary | ICD-10-CM | POA: Diagnosis not present

## 2018-01-02 DIAGNOSIS — Z7982 Long term (current) use of aspirin: Secondary | ICD-10-CM | POA: Diagnosis not present

## 2018-01-02 DIAGNOSIS — E049 Nontoxic goiter, unspecified: Secondary | ICD-10-CM | POA: Diagnosis not present

## 2018-01-02 DIAGNOSIS — Z471 Aftercare following joint replacement surgery: Secondary | ICD-10-CM | POA: Diagnosis not present

## 2018-01-02 DIAGNOSIS — M47812 Spondylosis without myelopathy or radiculopathy, cervical region: Secondary | ICD-10-CM | POA: Diagnosis not present

## 2018-01-02 DIAGNOSIS — I509 Heart failure, unspecified: Secondary | ICD-10-CM | POA: Diagnosis not present

## 2018-01-02 DIAGNOSIS — Z8673 Personal history of transient ischemic attack (TIA), and cerebral infarction without residual deficits: Secondary | ICD-10-CM | POA: Diagnosis not present

## 2018-01-02 DIAGNOSIS — G5603 Carpal tunnel syndrome, bilateral upper limbs: Secondary | ICD-10-CM | POA: Diagnosis not present

## 2018-01-02 DIAGNOSIS — Z96653 Presence of artificial knee joint, bilateral: Secondary | ICD-10-CM | POA: Diagnosis not present

## 2018-01-02 DIAGNOSIS — E7800 Pure hypercholesterolemia, unspecified: Secondary | ICD-10-CM | POA: Diagnosis not present

## 2018-01-02 DIAGNOSIS — Z7984 Long term (current) use of oral hypoglycemic drugs: Secondary | ICD-10-CM | POA: Diagnosis not present

## 2018-01-02 DIAGNOSIS — E1142 Type 2 diabetes mellitus with diabetic polyneuropathy: Secondary | ICD-10-CM | POA: Diagnosis not present

## 2018-01-02 DIAGNOSIS — I110 Hypertensive heart disease with heart failure: Secondary | ICD-10-CM | POA: Diagnosis not present

## 2018-01-02 DIAGNOSIS — H8109 Meniere's disease, unspecified ear: Secondary | ICD-10-CM | POA: Diagnosis not present

## 2018-01-02 DIAGNOSIS — I341 Nonrheumatic mitral (valve) prolapse: Secondary | ICD-10-CM | POA: Diagnosis not present

## 2018-01-03 DIAGNOSIS — E7800 Pure hypercholesterolemia, unspecified: Secondary | ICD-10-CM | POA: Diagnosis not present

## 2018-01-03 DIAGNOSIS — I110 Hypertensive heart disease with heart failure: Secondary | ICD-10-CM | POA: Diagnosis not present

## 2018-01-03 DIAGNOSIS — Z7982 Long term (current) use of aspirin: Secondary | ICD-10-CM | POA: Diagnosis not present

## 2018-01-03 DIAGNOSIS — E1142 Type 2 diabetes mellitus with diabetic polyneuropathy: Secondary | ICD-10-CM | POA: Diagnosis not present

## 2018-01-03 DIAGNOSIS — G5603 Carpal tunnel syndrome, bilateral upper limbs: Secondary | ICD-10-CM | POA: Diagnosis not present

## 2018-01-03 DIAGNOSIS — Z8673 Personal history of transient ischemic attack (TIA), and cerebral infarction without residual deficits: Secondary | ICD-10-CM | POA: Diagnosis not present

## 2018-01-03 DIAGNOSIS — I509 Heart failure, unspecified: Secondary | ICD-10-CM | POA: Diagnosis not present

## 2018-01-03 DIAGNOSIS — M47812 Spondylosis without myelopathy or radiculopathy, cervical region: Secondary | ICD-10-CM | POA: Diagnosis not present

## 2018-01-03 DIAGNOSIS — E049 Nontoxic goiter, unspecified: Secondary | ICD-10-CM | POA: Diagnosis not present

## 2018-01-03 DIAGNOSIS — Z471 Aftercare following joint replacement surgery: Secondary | ICD-10-CM | POA: Diagnosis not present

## 2018-01-03 DIAGNOSIS — Z7984 Long term (current) use of oral hypoglycemic drugs: Secondary | ICD-10-CM | POA: Diagnosis not present

## 2018-01-03 DIAGNOSIS — H8109 Meniere's disease, unspecified ear: Secondary | ICD-10-CM | POA: Diagnosis not present

## 2018-01-03 DIAGNOSIS — I341 Nonrheumatic mitral (valve) prolapse: Secondary | ICD-10-CM | POA: Diagnosis not present

## 2018-01-03 DIAGNOSIS — Z9181 History of falling: Secondary | ICD-10-CM | POA: Diagnosis not present

## 2018-01-03 DIAGNOSIS — Z96653 Presence of artificial knee joint, bilateral: Secondary | ICD-10-CM | POA: Diagnosis not present

## 2018-01-04 DIAGNOSIS — I509 Heart failure, unspecified: Secondary | ICD-10-CM | POA: Diagnosis not present

## 2018-01-04 DIAGNOSIS — E049 Nontoxic goiter, unspecified: Secondary | ICD-10-CM | POA: Diagnosis not present

## 2018-01-04 DIAGNOSIS — E1142 Type 2 diabetes mellitus with diabetic polyneuropathy: Secondary | ICD-10-CM | POA: Diagnosis not present

## 2018-01-04 DIAGNOSIS — I341 Nonrheumatic mitral (valve) prolapse: Secondary | ICD-10-CM | POA: Diagnosis not present

## 2018-01-04 DIAGNOSIS — I110 Hypertensive heart disease with heart failure: Secondary | ICD-10-CM | POA: Diagnosis not present

## 2018-01-04 DIAGNOSIS — Z7982 Long term (current) use of aspirin: Secondary | ICD-10-CM | POA: Diagnosis not present

## 2018-01-04 DIAGNOSIS — Z8673 Personal history of transient ischemic attack (TIA), and cerebral infarction without residual deficits: Secondary | ICD-10-CM | POA: Diagnosis not present

## 2018-01-04 DIAGNOSIS — H8109 Meniere's disease, unspecified ear: Secondary | ICD-10-CM | POA: Diagnosis not present

## 2018-01-04 DIAGNOSIS — Z9181 History of falling: Secondary | ICD-10-CM | POA: Diagnosis not present

## 2018-01-04 DIAGNOSIS — Z7984 Long term (current) use of oral hypoglycemic drugs: Secondary | ICD-10-CM | POA: Diagnosis not present

## 2018-01-04 DIAGNOSIS — Z96653 Presence of artificial knee joint, bilateral: Secondary | ICD-10-CM | POA: Diagnosis not present

## 2018-01-04 DIAGNOSIS — Z471 Aftercare following joint replacement surgery: Secondary | ICD-10-CM | POA: Diagnosis not present

## 2018-01-04 DIAGNOSIS — E7800 Pure hypercholesterolemia, unspecified: Secondary | ICD-10-CM | POA: Diagnosis not present

## 2018-01-04 DIAGNOSIS — M47812 Spondylosis without myelopathy or radiculopathy, cervical region: Secondary | ICD-10-CM | POA: Diagnosis not present

## 2018-01-04 DIAGNOSIS — G5603 Carpal tunnel syndrome, bilateral upper limbs: Secondary | ICD-10-CM | POA: Diagnosis not present

## 2018-01-11 ENCOUNTER — Ambulatory Visit (INDEPENDENT_AMBULATORY_CARE_PROVIDER_SITE_OTHER): Payer: Medicare Other | Admitting: Surgery

## 2018-01-11 DIAGNOSIS — Z96652 Presence of left artificial knee joint: Secondary | ICD-10-CM

## 2018-01-11 NOTE — Progress Notes (Signed)
67 year old female who is about 6-week status post left total knee replacement returns.  States that she has made good progress with home health PT.  Ambulating with a single prong cane.  Patient states that she has not resumed her Plavix.   Exam Patient ablating very well with a single prong cane.  Left knee range of motion about 0 to 95 degrees.  Surgical incision well-healed.  Some swelling but no significant effusion.  No signs of infection.  Calf nontender.  Neurovascular intact.  Plan I did fill out a prescription to start outpatient PT with [facility].  Follow-up in 6 weeks for recheck with Dr. Lorin Mercy..  I did advise patient that she must resume her Plavix.

## 2018-01-19 MED FILL — GABAPENTIN 300 MG CAPSULE: 300 | 90 days supply | Qty: 360 | Fill #0

## 2018-02-07 ENCOUNTER — Other Ambulatory Visit: Payer: Self-pay

## 2018-02-07 ENCOUNTER — Ambulatory Visit: Payer: Medicare Other | Attending: Surgery | Admitting: Physical Therapy

## 2018-02-07 ENCOUNTER — Encounter: Payer: Self-pay | Admitting: Physical Therapy

## 2018-02-07 DIAGNOSIS — M25662 Stiffness of left knee, not elsewhere classified: Secondary | ICD-10-CM | POA: Diagnosis present

## 2018-02-07 DIAGNOSIS — R600 Localized edema: Secondary | ICD-10-CM | POA: Diagnosis present

## 2018-02-07 DIAGNOSIS — M25562 Pain in left knee: Secondary | ICD-10-CM | POA: Diagnosis not present

## 2018-02-07 DIAGNOSIS — R262 Difficulty in walking, not elsewhere classified: Secondary | ICD-10-CM | POA: Diagnosis present

## 2018-02-07 NOTE — Therapy (Signed)
[place], [place], [address] Phone: [phone]   Fax:  [phone]  Physical Therapy Evaluation  Patient Details  Name: Sherry [name] MRN: [id number] Date of Birth: 07-01-1950 Referring Provider (PT): Bishop Limbo Date: 02/07/2018    Past Medical History:  Diagnosis Date  . Asthma   . Cerebral aneurysm   . Complication of anesthesia HYPOTENSION INTRAOP W/ HYSTERECTOMY IN 1987--  DID OK W/ TOTAL KNEE IN 2008  . Diabetes mellitus   . DJD (degenerative joint disease)   . Dyslipidemia   . GERD (gastroesophageal reflux disease)   . H/O hiatal hernia   . History of CHF (congestive heart failure) 2010-  RESOLVED  . History of peptic ulcer YRS AGO  . Hypertension   . Meniere's disease   . Mitral valve prolapse occasional palpitations w/ chest discomfort  . OA (osteoarthritis)   . Rotator cuff tear, left   . Stroke [facility])     Past Surgical History:  Procedure Laterality Date  . BUNIONECTOMY  2007   LEFT FOOT  . LEFT SHOULDER SURGERY  1997   ROTATOR CUFF REPAIR  . SHOULDER ARTHROSCOPY  09/20/2011   Procedure: ARTHROSCOPY SHOULDER;  Surgeon: Johnn Hai, MD;  Location: [facility];  Service: Orthopedics;  Laterality: Left;  SAD AND MINI OPEN ROTATOR CUFF REPAIR    . TOTAL KNEE ARTHROPLASTY  09-12-2006  [facility]   RIGHT KNEE  . TOTAL KNEE ARTHROPLASTY Left 11/30/2017   Procedure: LEFT TOTAL KNEE REPLACEMENT;  Surgeon: [name], MD;  Location: [facility];  Service: Orthopedics;  Laterality: Left;  . TUBAL LIGATION  1976  . TYMPANOPLASTY  1990;   1980  . VAGINAL HYSTERECTOMY  1987    There were no vitals filed for this visit.   Subjective Assessment - 02/07/18 0957    Subjective  Patient underwent a left TKR on 11/30/17.  She reports that she had home PT but that stopped 3 weeks ago.  She saw the MD recently and he wanted her to get some better ROM    Limitations  Lifting;Standing;Walking;House hold activities    Patient Stated Goals  have less pain, better motions, less difficulty with ADL's    Currently in Pain?  Yes    Pain Score  4     Pain Location  Knee    Pain Orientation  Left    Pain Descriptors / Indicators  Aching;Constant    Pain Type  Acute pain    Pain Onset  More than a month ago    Pain Frequency  Constant    Aggravating Factors   worse at night, worse with activity pain up to 8/10    Pain Relieving Factors  rest pain meds pain can be down to a 3/10    Effect of Pain on Daily Activities  limits everything especially ADL's         [facility] PT Assessment - 02/07/18 0001      Assessment   Medical Diagnosis  s/p left TKA    Referring Provider (PT)  Lorin Mercy    Onset Date/Surgical Date  11/30/17      Precautions   Precautions  None      Balance Screen   Has the patient fallen in the past 6 months  No    Has the patient had a decrease in activity level because of a fear of falling?   No  Is the patient reluctant to leave their home because of a fear of falling?   No      Home Environment   Additional Comments  has stairs, does the housework, likes to do some yardwork      Prior Function   Level of Independence  Independent    Vocation  Retired    Leisure  no exercise      ROM / Strength   AROM / PROM / Strength  AROM;PROM;Strength      AROM   AROM Assessment Site  Knee    Right/Left Knee  Left    Left Knee Extension  20    Left Knee Flexion  88      PROM   Overall PROM Comments  pain iwth passive motions    PROM Assessment Site  Knee    Right/Left Knee  Left    Left Knee Extension  9    Left Knee Flexion  94      Strength   Overall Strength Comments  left knee 4-/5 with pain      Palpation   Palpation comment  mild warmth, has a keloid starting and she reports tenderness      Ambulation/Gait   Gait Comments  uses a SPC, slow, stiff left leg, tends to want to creep with walls and furniture, first  few steps are really sore and stiff and antalgic, it smooths out some but is still very poor pattern, she does stairs one at a time      Standardized Balance Assessment   Standardized Balance Assessment  Timed Up and Go Test      Timed Up and Go Test   Normal TUG (seconds)  27                Objective measurements completed on examination: See above findings.      [facility] Adult PT Treatment/Exercise - 02/07/18 0001      Exercises   Exercises  Knee/Hip      Knee/Hip Exercises: Aerobic   Nustep  level 3 x 6 minutes               PT Short Term Goals - 02/07/18 1152      PT SHORT TERM GOAL #1   Title  independent with HEP    Time  3    Period  Weeks    Status  New        PT Long Term Goals - 02/07/18 1150      PT LONG TERM GOAL #1   Title  increase AROM of the left knee to 5-115 degrees flexion    Time  12    Period  Weeks    Status  New      PT LONG TERM GOAL #2   Title  decrease pain 50%    Time  12    Period  Weeks    Status  New      PT LONG TERM GOAL #3   Title  go up and down stairs step over step    Time  12    Period  Weeks    Status  New      PT LONG TERM GOAL #4   Title  increase strength to 4+/5    Time  12    Period  Weeks    Status  New               Patient will benefit from skilled  therapeutic intervention in order to improve the following deficits and impairments:     Visit Diagnosis: Acute pain of left knee - Plan: PT plan of care cert/re-cert  Stiffness of left knee, not elsewhere classified - Plan: PT plan of care cert/re-cert  Difficulty in walking, not elsewhere classified - Plan: PT plan of care cert/re-cert  Localized edema - Plan: PT plan of care cert/re-cert     Problem List Patient Active Problem List   Diagnosis Date Noted  . Arthritis of left knee 11/30/2017  . S/P total knee replacement, right 03/29/2017  . Spondylosis without myelopathy or radiculopathy, cervical region 03/29/2017  .  Unilateral primary osteoarthritis, left knee 03/29/2017  . Bilateral carpal tunnel syndrome 03/29/2017  . Acute asthma exacerbation 08/19/2016  . Preoperative cardiovascular examination 08/16/2016  . Cervicalgia 03/23/2016  . Acute back pain with sciatica, right 01/14/2016  . Vertigo 04/14/2015  . Anxiety state 01/22/2015  . HLD (hyperlipidemia) 01/22/2015  . Type 2 diabetes mellitus with circulatory disorder ([place]) 01/22/2015  . Intracranial vascular stenosis 01/22/2015  . Aneurysm ([place])   . Headache   . Diabetes mellitus with complication ([place]) 58/30/9407  . Essential hypertension 11/12/2014  . Dependent edema 11/12/2014  . Peripheral neuropathy 11/12/2014  . Depression 11/12/2014  . Facial droop   . Chest pain   . TIA (transient ischemic attack) 11/11/2014  . Dyspnea 08/06/2014  . Hyperthyroidism 08/06/2014  . Meniere's disease 06/07/2014  . History of CHF (congestive heart failure) 06/07/2014  . Essential hypertension, benign 06/13/2013  . Goiter 06/13/2013  . Obesity, unspecified 06/13/2013    Sumner Boast., PT 02/07/2018, 11:53 AM  [place] [address] [place], [place], [address] Phone: [phone]   Fax:  [phone]  Name: Sherry [name] MRN: [id number] Date of Birth: 08/27/1950

## 2018-02-13 MED FILL — FUROSEMIDE 20 MG TAB: 20 | 30 days supply | Qty: 30 | Fill #3

## 2018-02-13 MED FILL — metFORMIN HCL 500 MG TABS: 500 | 90 days supply | Qty: 360 | Fill #3

## 2018-02-14 ENCOUNTER — Other Ambulatory Visit: Payer: Self-pay

## 2018-02-14 ENCOUNTER — Encounter: Payer: Self-pay | Admitting: Physical Therapy

## 2018-02-14 ENCOUNTER — Ambulatory Visit: Payer: Medicare Other | Attending: Surgery | Admitting: Physical Therapy

## 2018-02-14 DIAGNOSIS — R262 Difficulty in walking, not elsewhere classified: Secondary | ICD-10-CM | POA: Diagnosis present

## 2018-02-14 DIAGNOSIS — R600 Localized edema: Secondary | ICD-10-CM | POA: Insufficient documentation

## 2018-02-14 DIAGNOSIS — M25662 Stiffness of left knee, not elsewhere classified: Secondary | ICD-10-CM | POA: Insufficient documentation

## 2018-02-14 DIAGNOSIS — M25562 Pain in left knee: Secondary | ICD-10-CM | POA: Insufficient documentation

## 2018-02-14 MED ORDER — GLUCOSE BLOOD VI STRP: ORAL_STRIP | 12 refills | Status: DC

## 2018-02-14 MED FILL — ACCU-CHEK AVIVA PLUS TEST S: 30 days supply | Qty: 100 | Fill #0

## 2018-02-14 NOTE — Progress Notes (Addendum)
[facility] [address], [place], [place] [address] [phone]  Date:  02/15/2018   Name:  Sherry [name]   DOB:  March 05, 1950   MRN:  [id number]  PCP:  Darreld Mclean, MD    Chief Complaint: Follow-up (surgical follow up, still having large amounts of pain, left knee)   History of Present Illness:  Sherry [name] is a 68 y.o. very pleasant female patient who presents with the following: History of hypertension, type 2 diabetes, hypothyroidism, TIA in 2016 and aneurysm.  Her aneurysm was stable on MRI done in January 2019 She recently had a knee replacement-left total knee performed on 11/30/2017 She was doing in home PT until the holidays, and started going to outpt PT yesterday.  This did seem to help with her mobility.  Her ROM is now at 120 degrees  She does not have knee pain at rest. But will still get pain with walking-she has tried Aleve for this on the recommendation of her therapist, and it is quite helpful  I last saw her in the office prior to her surgery, in October of last year Her husband died last year, which is been a difficult transition.  She is also raising a young adult grandson who has been challenging.  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  She was also seen by her neurologist in October They advised her to continue her Plavix, with a 5-day break prior to her operation.  They also recommended that we maintain her blood pressure less than 130/80, A1c less than 6-1/2 an LDL under 70 for secondary stroke prevention  We will check her A1c and lipids today  CBC to monitor her blood loss from the operation CMP as well  She had banana and yogurt so far today Lab Results  Component Value Date   HGBA1C 6.7 (H) 11/14/2017   Mammogram: she did ?2017.  I will order for her  Eye exam: she is overdue and will do asap  Immunizations: It looks like she is due for  Pneumovax, question tetanus.  Can also mention Shingrix to her Pneumovax today   She is allergic to flu shot Patient Active Problem List   Diagnosis Date Noted  . Arthritis of left knee 11/30/2017  . S/P total knee replacement, right 03/29/2017  . Spondylosis without myelopathy or radiculopathy, cervical region 03/29/2017  . Unilateral primary osteoarthritis, left knee 03/29/2017  . Bilateral carpal tunnel syndrome 03/29/2017  . Acute asthma exacerbation 08/19/2016  . Preoperative cardiovascular examination 08/16/2016  . Cervicalgia 03/23/2016  . Acute back pain with sciatica, right 01/14/2016  . Vertigo 04/14/2015  . Anxiety state 01/22/2015  . HLD (hyperlipidemia) 01/22/2015  . Type 2 diabetes mellitus with circulatory disorder ([place]) 01/22/2015  . Intracranial vascular stenosis 01/22/2015  . Aneurysm ([place])   . Headache   . Diabetes mellitus with complication ([place]) 51/76/1607  . Essential hypertension 11/12/2014  . Dependent edema 11/12/2014  . Peripheral neuropathy 11/12/2014  . Depression 11/12/2014  . Facial droop   . Chest pain   . TIA (transient ischemic attack) 11/11/2014  . Dyspnea 08/06/2014  . Hyperthyroidism 08/06/2014  . Meniere's disease 06/07/2014  . History of CHF (congestive heart failure) 06/07/2014  . Essential hypertension, benign 06/13/2013  . Goiter 06/13/2013  . Obesity, unspecified 06/13/2013    Past Medical History:  Diagnosis Date  . Asthma   . Cerebral aneurysm   .  Complication of anesthesia HYPOTENSION INTRAOP W/ HYSTERECTOMY IN 1987--  DID OK W/ TOTAL KNEE IN 2008  . Diabetes mellitus   . DJD (degenerative joint disease)   . Dyslipidemia   . GERD (gastroesophageal reflux disease)   . H/O hiatal hernia   . History of CHF (congestive heart failure) 2010-  RESOLVED  . History of peptic ulcer YRS AGO  . Hypertension   . Meniere's disease   . Mitral valve prolapse occasional palpitations w/ chest discomfort  . OA (osteoarthritis)   .  Rotator cuff tear, left   . Stroke [facility])     Past Surgical History:  Procedure Laterality Date  . BUNIONECTOMY  2007   LEFT FOOT  . LEFT SHOULDER SURGERY  1997   ROTATOR CUFF REPAIR  . SHOULDER ARTHROSCOPY  09/20/2011   Procedure: ARTHROSCOPY SHOULDER;  Surgeon: Johnn Hai, MD;  Location[facility];  Service: Orthopedics;  Laterality: Left;  SAD AND MINI OPEN ROTATOR CUFF REPAIR    . TOTAL KNEE ARTHROPLASTY  09-12-2006  [facility]   RIGHT KNEE  . TOTAL KNEE ARTHROPLASTY Left 11/30/2017   Procedure: LEFT TOTAL KNEE REPLACEMENT;  Surgeon: [name], MD;  Location: [facility];  Service: Orthopedics;  Laterality: Left;  . TUBAL LIGATION  1976  . TYMPANOPLASTY  1990;   1980  . VAGINAL HYSTERECTOMY  1987    Social History   Tobacco Use  . Smoking status: Never Smoker  . Smokeless tobacco: Never Used  Substance Use Topics  . Alcohol use: No  . Drug use: No    Family History  Problem Relation Age of Onset  . Heart disease Mother        in her 101s, heart attack  . Diabetes Mother   . Kidney disease Mother        dialysis  . Thyroid disease Mother   . Aneurysm Mother   . Ovarian cancer Mother   . Breast cancer Mother   . Thyroid disease Sister   . Heart disease Sister   . Lymphoma Father   . Diabetes Father   . Hypertension Father   . Congestive Heart Failure Father   . Thyroid disease Brother   . Thyroid disease Paternal Grandmother   . Heart attack Sister        at age 49  . Stroke Sister   . Hypertension Sister   . Breast cancer Maternal Aunt   . Breast cancer Maternal Grandmother   . Breast cancer Maternal Aunt   . Breast cancer Maternal Aunt   . Other Brother        Musician accident  . Bipolar disorder Son   . Other Son        GSW    Allergies  Allergen Reactions  . Influenza Vaccines Anaphylaxis  . Demerol Other (See Comments)    SEIZURE  . Nsaids Other (See Comments)    NAPROXEN, IBUPROFEN, SKELAXIN---  CAUSES PALPITATIONS  . Prednisone  Other (See Comments)    Caused her glucose to go up to 500 and went to ED  . Tramadol Other (See Comments)    Contraindicated with Victoza   . Codeine Itching  . Mango Flavor [Flavoring Agent] Itching    Medication list has been reviewed and updated.  Current Outpatient Medications on File Prior to Visit  Medication Sig Dispense Refill  . albuterol (PROAIR HFA) 108 (90 Base) MCG/ACT inhaler Inhale 1-2 puffs into the lungs every 6 (six) hours as needed for  wheezing or shortness of breath. 18 g 6  . albuterol (PROVENTIL) (2.5 MG/3ML) 0.083% nebulizer solution Take 3 mLs (2.5 mg total) by nebulization every 6 (six) hours as needed for wheezing or shortness of breath. 150 mL 1  . aspirin (BAYER ASPIRIN) 325 MG tablet Take 1 tablet (325 mg total) by mouth daily. 30 tablet   . Bioflavonoid Products (VITAMIN C) CHEW Chew 2 each by mouth daily.    . cetirizine (ZYRTEC) 10 MG tablet Take 1 tablet (10 mg total) by mouth daily. 90 tablet 3  . clopidogrel (PLAVIX) 75 MG tablet TAKE 1 TABLET BY MOUTH  DAILY (Patient taking differently: Take 75 mg by mouth daily. ) 90 tablet 3  . docusate sodium (COLACE) 100 MG capsule Take 1 capsule (100 mg total) by mouth daily. 10 capsule 2  . empagliflozin (JARDIANCE) 10 MG TABS tablet TAKE 10 mg TABLET BY MOUTH  DAILY (Patient taking differently: Take 5 mg by mouth daily. ) 90 tablet 0  . famotidine (PEPCID) 10 MG tablet Take 10 mg by mouth daily.    . fluticasone (FLOVENT HFA) 220 MCG/ACT inhaler Inhale 1 puff into the lungs 2 (two) times daily. May increase to 2 puffs if needed (Patient taking differently: Inhale 2 puffs into the lungs 2 (two) times daily as needed (for shorness of breath or wheezing). ) 1 Inhaler 12  . furosemide (LASIX) 20 MG tablet Take 1 tablet (20 mg total) by mouth daily. 30 tablet 5  . gabapentin (NEURONTIN) 300 MG capsule Take 2 capsules (600 mg total) by mouth 2 (two) times daily. 360 capsule 0  . glucose blood (ACCU-CHEK AVIVA PLUS) test  strip Use as instructed 100 each 12  . HYDROcodone-acetaminophen (NORCO/VICODIN) 5-325 MG tablet Norco 5-325 1 PO TID PRN FOR PAIN 30 tablet 0  . ipratropium (ATROVENT) 0.03 % nasal spray Place 2 sprays into the nose 4 (four) times daily. (Patient taking differently: Place 2 sprays into the nose 2 (two) times daily as needed for rhinitis. ) 60 mL 0  . Lancets Misc. (ACCU-CHEK SOFTCLIX LANCET DEV) KIT USE AS DIRECTED 1 kit 0  . linagliptin (TRADJENTA) 5 MG TABS tablet Take 0.5 tablets (2.5 mg total) by mouth daily. 45 tablet 0  . lisinopril (PRINIVIL,ZESTRIL) 20 MG tablet Take 1 tablet (20 mg total) by mouth daily. 90 tablet 0  . metFORMIN (GLUCOPHAGE) 500 MG tablet Take 2 tablets (1,000 mg total) by mouth 2 (two) times daily. TAKE 2 TABLETS(1000 MG) BY MOUTH TWICE DAILY WITH A MEAL (Patient taking differently: Take 1,000 mg by mouth 2 (two) times daily. ) 360 tablet 3  . methocarbamol (ROBAXIN) 500 MG tablet Take 1 tablet (500 mg total) by mouth 3 (three) times daily. 40 tablet 0  . metoprolol tartrate (LOPRESSOR) 25 MG tablet Take 1 tablet (25 mg total) by mouth 2 (two) times daily. 180 tablet 0  . montelukast (SINGULAIR) 10 MG tablet Take 1 tablet (10 mg total) by mouth at bedtime. 30 tablet 3  . Multiple Vitamins-Minerals (MULTIVITAMIN PO) Take 2 tablets by mouth daily.    Marland Kitchen oxyCODONE-acetaminophen (PERCOCET) 5-325 MG tablet Take 1 tablet by mouth every 4 (four) hours as needed for severe pain. 40 tablet 0  . QUEtiapine (SEROQUEL) 100 MG tablet Take 0.5 tablets (50 mg total) by mouth at bedtime. 45 tablet 3  . sertraline (ZOLOFT) 50 MG tablet TAKE ONE-HALF TABLET BY  MOUTH AT BEDTIME (Patient taking differently: Take 25 mg by mouth at bedtime. ) 45 tablet  3   No current facility-administered medications on file prior to visit.     Review of Systems:  As per HPI- otherwise negative. No fever or chills, no chest pain or shortness of breath, She does note some loneliness since her husband  passed away, family has not been present as much as she might like  Physical Examination: Vitals:   02/15/18 0938  BP: 112/70  Pulse: 77  Resp: 16  Temp: 97.6 F (36.4 C)  SpO2: 98%   Vitals:   02/15/18 0938  Weight: 174 lb (78.9 kg)  Height: 5' 1"  (1.549 m)   Body mass index is 32.88 kg/m. Ideal Body Weight: Weight in (lb) to have BMI = 25: 132  GEN: WDWN, NAD, Non-toxic, A & O x 3 overweight, looks well HEENT: Atraumatic, Normocephalic. Neck supple. No masses, No LAD. Ears and Nose: No external deformity. CV: RRR, No M/G/R. No JVD. No thrill. No extra heart sounds. PULM: CTA B, no wheezes, crackles, rhonchi. No retractions. No resp. distress. No accessory muscle use. EXTR: No c/c/e NEURO Normal gait.  PSYCH: Normally interactive. Conversant. Not depressed or anxious appearing.  Calm demeanor.    Assessment and Plan: Essential hypertension  Type 2 diabetes mellitus with complication, without long-term current use of insulin ([place]) - Plan: Comprehensive metabolic panel, Hemoglobin A1c, blood glucose meter kit and supplies KIT  S/P total knee replacement, right  Hyperlipidemia, unspecified hyperlipidemia type - Plan: Lipid panel  TIA (transient ischemic attack) - Plan: Lipid panel  Acute blood loss anemia - Plan: CBC  Immunization due - Plan: Pneumococcal polysaccharide vaccine 23-valent greater than or equal to 2yo subcutaneous/IM  Screening for breast cancer - Plan: MM 3D SCREEN BREAST BILATERAL  Following up today.  She had a knee replaced in October, and has done fairly well.  Still having some pain but is controlled over-the-counter medications.  Blood pressure is at goal. Gave Pneumovax today. Mammogram ordered. Labs pending as above, will follow-up in her diabetes and cholesterol  Signed [name], MD  Received her labs as below, message to patient  Results for orders placed or performed in visit on 02/15/18  CBC  Result Value Ref Range   WBC  6.5 4.0 - 10.5 K/uL   RBC 5.13 (H) 3.87 - 5.11 Mil/uL   Platelets 298.0 150.0 - 400.0 K/uL   Hemoglobin 14.5 12.0 - 15.0 g/dL   HCT 44.9 36.0 - 46.0 %   MCV 87.4 78.0 - 100.0 fl   MCHC 32.4 30.0 - 36.0 g/dL   RDW 15.2 11.5 - 15.5 %  Comprehensive metabolic panel  Result Value Ref Range   Sodium 142 135 - 145 mEq/L   Potassium 4.7 3.5 - 5.1 mEq/L   Chloride 105 96 - 112 mEq/L   CO2 29 19 - 32 mEq/L   Glucose, Bld 97 70 - 99 mg/dL   BUN 23 6 - 23 mg/dL   Creatinine, Ser 0.76 0.40 - 1.20 mg/dL   Total Bilirubin 0.3 0.2 - 1.2 mg/dL   Alkaline Phosphatase 82 39 - 117 U/L   AST 12 0 - 37 U/L   ALT 9 0 - 35 U/L   Total Protein 6.7 6.0 - 8.3 g/dL   Albumin 4.3 3.5 - 5.2 g/dL   Calcium 9.7 8.4 - 10.5 mg/dL   GFR 97.59 >60.00 mL/min  Hemoglobin A1c  Result Value Ref Range   Hgb A1c MFr Bld 6.1 4.6 - 6.5 %  Lipid panel  Result Value Ref Range  Cholesterol 147 0 - 200 mg/dL   Triglycerides 116.0 0.0 - 149.0 mg/dL   HDL 65.50 >39.00 mg/dL   VLDL 23.2 0.0 - 40.0 mg/dL   LDL Cholesterol 58 0 - 99 mg/dL   Total CHOL/HDL Ratio 2    NonHDL 81.20     She has a keep her A1c under 6.5, and LDL under 70 a secondary stroke prevention, she is meeting his goals

## 2018-02-14 NOTE — Therapy (Signed)
[facility] [place], [place], [address] Phone: [phone]   Fax:  [phone]  Physical Therapy Treatment  Patient Details  Name: Sherry [name] MRN: [id number] Date of Birth: 02/02/1951 Referring Provider (PT): Bishop Limbo Date: 02/14/2018  PT End of Session - 02/14/18 0916    Visit Number  2    Date for PT Re-Evaluation  04/07/18    PT Start Time  0830    PT Stop Time  0930    PT Time Calculation (min)  60 min       Past Medical History:  Diagnosis Date  . Asthma   . Cerebral aneurysm   . Complication of anesthesia HYPOTENSION INTRAOP W/ HYSTERECTOMY IN 1987--  DID OK W/ TOTAL KNEE IN 2008  . Diabetes mellitus   . DJD (degenerative joint disease)   . Dyslipidemia   . GERD (gastroesophageal reflux disease)   . H/O hiatal hernia   . History of CHF (congestive heart failure) 2010-  RESOLVED  . History of peptic ulcer YRS AGO  . Hypertension   . Meniere's disease   . Mitral valve prolapse occasional palpitations w/ chest discomfort  . OA (osteoarthritis)   . Rotator cuff tear, left   . Stroke [facility])     Past Surgical History:  Procedure Laterality Date  . BUNIONECTOMY  2007   LEFT FOOT  . LEFT SHOULDER SURGERY  1997   ROTATOR CUFF REPAIR  . SHOULDER ARTHROSCOPY  09/20/2011   Procedure: ARTHROSCOPY SHOULDER;  Surgeon: Johnn Hai, MD;  Location: [facility];  Service: Orthopedics;  Laterality: Left;  SAD AND MINI OPEN ROTATOR CUFF REPAIR    . TOTAL KNEE ARTHROPLASTY  09-12-2006  [facility]   RIGHT KNEE  . TOTAL KNEE ARTHROPLASTY Left 11/30/2017   Procedure: LEFT TOTAL KNEE REPLACEMENT;  Surgeon: [name], MD;  Location: [facility];  Service: Orthopedics;  Laterality: Left;  . TUBAL LIGATION  1976  . TYMPANOPLASTY  1990;   1980  . VAGINAL HYSTERECTOMY  1987    There were no vitals filed for this visit.  Subjective Assessment - 02/14/18 0837    Subjective  " i  have been working hard since here last time." " started taking advil and it helps"    Currently in Pain?  Yes    Pain Score  2     Pain Location  Knee    Pain Orientation  Left         [facility] PT Assessment - 02/14/18 0001      AROM   AROM Assessment Site  Knee    Right/Left Knee  Left    Left Knee Extension  6    Left Knee Flexion  100                   [facility] Adult PT Treatment/Exercise - 02/14/18 0001      Knee/Hip Exercises: Aerobic   Recumbent Bike  5 min   full revolutions   Nustep  L 4 6 min      Knee/Hip Exercises: Machines for Strengthening   Cybex Knee Extension  5# 2 sets 10   verb and tactile cuing   Cybex Knee Flexion  10# 2 sets 10    Cybex Leg Press  20# 2 sets 10    Total Gym Leg Press  calf raises 20# 2 sets 10      Knee/Hip Exercises: Seated  Other Seated Knee/Hip Exercises  red tband TKE 2 sets 10    Hamstring Curl  Left;2 sets;10 reps   red tband     Modalities   Modalities  Vasopneumatic      Vasopneumatic   Number Minutes Vasopneumatic   15 minutes    Vasopnuematic Location   Knee    Vasopneumatic Pressure  Medium      Manual Therapy   Manual Therapy  Passive ROM    Passive ROM  flex/ext             PT Education - 02/14/18 0924    Education Details  educ on scar massage and moving every hour    Person(s) Educated  Patient    Methods  Explanation;Demonstration    Comprehension  Verbalized understanding       PT Short Term Goals - 02/14/18 0919      PT SHORT TERM GOAL #1   Title  independent with HEP    Baseline  doing ex from HHPT    Status  Achieved        PT Long Term Goals - 02/07/18 1150      PT LONG TERM GOAL #1   Title  increase AROM of the left knee to 5-115 degrees flexion    Time  12    Period  Weeks    Status  New      PT LONG TERM GOAL #2   Title  decrease pain 50%    Time  12    Period  Weeks    Status  New      PT LONG TERM GOAL #3   Title  go up and down stairs step over step     Time  12    Period  Weeks    Status  New      PT LONG TERM GOAL #4   Title  increase strength to 4+/5    Time  12    Period  Weeks    Status  New            Plan - 02/14/18 0919    Clinical Impression Statement  STG met ( doing ex from HHPT) stiff at initail start but loosed and had increased AROM. cuing with all exercises needed    PT Treatment/Interventions  ADLs/Self Care Home Management;Electrical Stimulation;Cryotherapy;Moist Heat;Ultrasound;Gait training;Functional mobility training;Therapeutic activities;Therapeutic exercise;Balance training;Patient/family education;Manual techniques;Vasopneumatic Device    PT Next Visit Plan  progress ROM and strength       Patient will benefit from skilled therapeutic intervention in order to improve the following deficits and impairments:  Abnormal gait, Pain, Decreased range of motion, Decreased strength, Impaired sensation, Decreased mobility, Difficulty walking, Increased edema, Impaired flexibility  Visit Diagnosis: Acute pain of left knee  Stiffness of left knee, not elsewhere classified  Difficulty in walking, not elsewhere classified     Problem List Patient Active Problem List   Diagnosis Date Noted  . Arthritis of left knee 11/30/2017  . S/P total knee replacement, right 03/29/2017  . Spondylosis without myelopathy or radiculopathy, cervical region 03/29/2017  . Unilateral primary osteoarthritis, left knee 03/29/2017  . Bilateral carpal tunnel syndrome 03/29/2017  . Acute asthma exacerbation 08/19/2016  . Preoperative cardiovascular examination 08/16/2016  . Cervicalgia 03/23/2016  . Acute back pain with sciatica, right 01/14/2016  . Vertigo 04/14/2015  . Anxiety state 01/22/2015  . HLD (hyperlipidemia) 01/22/2015  . Type 2 diabetes mellitus with circulatory disorder ([place]) 01/22/2015  . Intracranial vascular  stenosis 01/22/2015  . Aneurysm ([place])   . Headache   . Diabetes mellitus with complication ([place])  54/27/0623  . Essential hypertension 11/12/2014  . Dependent edema 11/12/2014  . Peripheral neuropathy 11/12/2014  . Depression 11/12/2014  . Facial droop   . Chest pain   . TIA (transient ischemic attack) 11/11/2014  . Dyspnea 08/06/2014  . Hyperthyroidism 08/06/2014  . Meniere's disease 06/07/2014  . History of CHF (congestive heart failure) 06/07/2014  . Essential hypertension, benign 06/13/2013  . Goiter 06/13/2013  . Obesity, unspecified 06/13/2013    PAYSEUR,ANGIE PTA 02/14/2018, 9:27 AM  [address] [place], [place], [address] Phone: [phone]   Fax:  [phone]  Name: Sherry [name] MRN: [id number] Date of Birth: [birth year]

## 2018-02-15 ENCOUNTER — Ambulatory Visit (INDEPENDENT_AMBULATORY_CARE_PROVIDER_SITE_OTHER): Payer: Medicare Other | Admitting: Family Medicine

## 2018-02-15 ENCOUNTER — Encounter: Payer: Self-pay | Admitting: Family Medicine

## 2018-02-15 VITALS — BP 112/70 | HR 77 | Temp 97.6°F | Resp 16 | Ht 61.0 in | Wt 174.0 lb

## 2018-02-15 DIAGNOSIS — E118 Type 2 diabetes mellitus with unspecified complications: Secondary | ICD-10-CM

## 2018-02-15 DIAGNOSIS — G459 Transient cerebral ischemic attack, unspecified: Secondary | ICD-10-CM | POA: Diagnosis not present

## 2018-02-15 DIAGNOSIS — Z96651 Presence of right artificial knee joint: Secondary | ICD-10-CM

## 2018-02-15 DIAGNOSIS — I10 Essential (primary) hypertension: Secondary | ICD-10-CM | POA: Diagnosis not present

## 2018-02-15 DIAGNOSIS — Z23 Encounter for immunization: Secondary | ICD-10-CM | POA: Diagnosis not present

## 2018-02-15 DIAGNOSIS — E785 Hyperlipidemia, unspecified: Secondary | ICD-10-CM

## 2018-02-15 DIAGNOSIS — D62 Acute posthemorrhagic anemia: Secondary | ICD-10-CM

## 2018-02-15 DIAGNOSIS — Z1239 Encounter for other screening for malignant neoplasm of breast: Secondary | ICD-10-CM

## 2018-02-15 LAB — LIPID PANEL
Cholesterol: 147 mg/dL (ref 0–200)
HDL: 65.5 mg/dL (ref >39.00)
LDL Cholesterol: 58 mg/dL (ref 0–99)
NonHDL: 81.2
Total CHOL/HDL Ratio: 2
Triglycerides: 116 mg/dL (ref 0.0–149.0)
VLDL: 23.2 mg/dL (ref 0.0–40.0)

## 2018-02-15 LAB — COMPREHENSIVE METABOLIC PANEL
ALT: 9 U/L (ref 0–35)
AST: 12 U/L (ref 0–37)
Albumin: 4.3 g/dL (ref 3.5–5.2)
Alkaline Phosphatase: 82 U/L (ref 39–117)
BUN: 23 mg/dL (ref 6–23)
CO2: 29 mEq/L (ref 19–32)
Calcium: 9.7 mg/dL (ref 8.4–10.5)
Chloride: 105 mEq/L (ref 96–112)
Creatinine, Ser: 0.76 mg/dL (ref 0.40–1.20)
GFR: 97.59 mL/min (ref >60.00)
Glucose, Bld: 97 mg/dL (ref 70–99)
Potassium: 4.7 mEq/L (ref 3.5–5.1)
Sodium: 142 mEq/L (ref 135–145)
Total Bilirubin: 0.3 mg/dL (ref 0.2–1.2)
Total Protein: 6.7 g/dL (ref 6.0–8.3)

## 2018-02-15 LAB — CBC
HCT: 44.9 % (ref 36.0–46.0)
Hemoglobin: 14.5 g/dL (ref 12.0–15.0)
MCHC: 32.4 g/dL (ref 30.0–36.0)
MCV: 87.4 fl (ref 78.0–100.0)
Platelets: 298 10*3/uL (ref 150.0–400.0)
RBC: 5.13 Mil/uL — ABNORMAL HIGH (ref 3.87–5.11)
RDW: 15.2 % (ref 11.5–15.5)
WBC: 6.5 10*3/uL (ref 4.0–10.5)

## 2018-02-15 LAB — HEMOGLOBIN A1C: Hgb A1c MFr Bld: 6.1 % (ref 4.6–6.5)

## 2018-02-15 MED ORDER — BLOOD GLUCOSE MONITOR KIT: PACK | 0 refills | Status: DC

## 2018-02-15 NOTE — Patient Instructions (Signed)
It was very nice to see you today, I am glad that your knee is getting better. I ordered a mammogram for you at the breast center of [facility].  They should call you, but certainly also feel free to reach out to them.  It looks like you are due for a mammogram ASAP  You are also due to see your eye doctor as soon as is convenient for you  I will be in touch with your labs.  Assuming your labs look okay, we can plan to visit in 4 to 6 months

## 2018-02-16 ENCOUNTER — Ambulatory Visit: Payer: Medicare Other | Admitting: Physical Therapy

## 2018-02-16 DIAGNOSIS — M25562 Pain in left knee: Secondary | ICD-10-CM

## 2018-02-16 DIAGNOSIS — R262 Difficulty in walking, not elsewhere classified: Secondary | ICD-10-CM | POA: Diagnosis not present

## 2018-02-16 DIAGNOSIS — M25662 Stiffness of left knee, not elsewhere classified: Secondary | ICD-10-CM

## 2018-02-16 DIAGNOSIS — R600 Localized edema: Secondary | ICD-10-CM | POA: Diagnosis not present

## 2018-02-16 NOTE — Therapy (Addendum)
[place], [place], [address] Phone: [phone]   Fax:  [phone]  Physical Therapy Treatment  Patient Details  Name: Sherry [name] MRN: [id number] Date of Birth: [birth year] Referring Provider (PT): Lorin Mercy   Encounter Date: 02/16/2018  PT End of Session - 02/16/18 0927     Visit Number  3    Date for PT Re-Evaluation  04/07/18    PT Start Time  0840    PT Stop Time  0940    PT Time Calculation (min)  60 min        Past Medical History:  Diagnosis Date   Asthma    Cerebral aneurysm    Complication of anesthesia HYPOTENSION INTRAOP W/ HYSTERECTOMY IN 1987--  DID OK W/ TOTAL KNEE IN 2008   Diabetes mellitus    DJD (degenerative joint disease)    Dyslipidemia    GERD (gastroesophageal reflux disease)    H/O hiatal hernia    History of CHF (congestive heart failure) 2010-  RESOLVED   History of peptic ulcer YRS AGO   Hypertension    Meniere's disease    Mitral valve prolapse occasional palpitations w/ chest discomfort   OA (osteoarthritis)    Rotator cuff tear, left    Stroke [facility])     Past Surgical History:  Procedure Laterality Date   BUNIONECTOMY  2007   LEFT FOOT   LEFT SHOULDER SURGERY  1997   ROTATOR CUFF REPAIR   SHOULDER ARTHROSCOPY  09/20/2011   Procedure: ARTHROSCOPY SHOULDER;  Surgeon: Johnn Hai, MD;  Location: [place];  Service: Orthopedics;  Laterality: Left;  SAD AND MINI OPEN ROTATOR CUFF REPAIR     TOTAL KNEE ARTHROPLASTY  09-12-2006  [facility]   RIGHT KNEE   TOTAL KNEE ARTHROPLASTY Left 11/30/2017   Procedure: LEFT TOTAL KNEE REPLACEMENT;  Surgeon: [name], MD;  Location: [place];  Service: Orthopedics;  Laterality: Left;   TUBAL LIGATION  1976   TYMPANOPLASTY  1990;   [place]    There were no vitals filed for this visit.  Subjective Assessment - 02/16/18 0842     Subjective  surprised how much  better knee is doing, moving better. more stiffness vs pain    Currently in Pain?  Yes    Pain Score  2     Pain Location  Knee    Pain Orientation  Left                        [facility] Adult PT Treatment/Exercise - 02/16/18 0001       Knee/Hip Exercises: Aerobic   Recumbent Bike  6 min    Nustep  L 5 6 min      Knee/Hip Exercises: Machines for Strengthening   Cybex Knee Extension  5# 2 sets 10    Cybex Knee Flexion  15# 2 sets 10    Cybex Leg Press  20# 3 sets 10    Total Gym Leg Press  calf raises 20# 2 sets 10      Knee/Hip Exercises: Standing   Heel Raises  Both;15 reps   black bar   Other Standing Knee Exercises  standing HS stretch 10 reps HOLD 5 sec    Other Standing Knee Exercises  TKE pushing ball into wall 10 resp hold 3 sec      Modalities   Modalities  Vasopneumatic      Vasopneumatic   Number Minutes Vasopneumatic   15 minutes    Vasopnuematic Location   Knee    Vasopneumatic Pressure  Medium      Manual Therapy   Manual Therapy  Passive ROM    Passive ROM  flex/ext                PT Short Term Goals - 02/14/18 0919       PT SHORT TERM GOAL #1   Title  independent with HEP    Baseline  doing ex from HHPT    Status  Achieved         PT Long Term Goals - 02/07/18 1150       PT LONG TERM GOAL #1   Title  increase AROM of the left knee to 5-115 degrees flexion    Time  12    Period  Weeks    Status  New      PT LONG TERM GOAL #2   Title  decrease pain 50%    Time  12    Period  Weeks    Status  New      PT LONG TERM GOAL #3   Title  go up and down stairs step over step    Time  12    Period  Weeks    Status  New      PT LONG TERM GOAL #4   Title  increase strength to 4+/5    Time  12    Period  Weeks    Status  New             Plan - 02/16/18 3818     Clinical Impression Statement  increased wt and reps with ex today- cuing needed to not compensate but better quad activation. increased tolerance  to PROM with increased motion.    PT Treatment/Interventions  ADLs/Self Care Home Management;Electrical Stimulation;Cryotherapy;Moist Heat;Ultrasound;Gait training;Functional mobility training;Therapeutic activities;Therapeutic exercise;Balance training;Patient/family education;Manual techniques;Vasopneumatic Device    PT Next Visit Plan  progress ROM and strength. GAIT        Patient will benefit from skilled therapeutic intervention in order to improve the following deficits and impairments:  Abnormal gait, Pain, Decreased range of motion, Decreased strength, Impaired sensation, Decreased mobility, Difficulty walking, Increased edema, Impaired flexibility  Visit Diagnosis: Acute pain of left knee  Stiffness of left knee, not elsewhere classified  Difficulty in walking, not elsewhere classified  Localized edema     Problem List Patient Active Problem List   Diagnosis Date Noted   Arthritis of left knee 11/30/2017   S/P total knee replacement, right 03/29/2017   Spondylosis without myelopathy or radiculopathy, cervical region 03/29/2017   Unilateral primary osteoarthritis, left knee 03/29/2017   Bilateral carpal tunnel syndrome 03/29/2017   Acute asthma exacerbation 08/19/2016   Preoperative cardiovascular examination 08/16/2016   Cervicalgia 03/23/2016   Acute back pain with sciatica, right 01/14/2016   Vertigo 04/14/2015   Anxiety state 01/22/2015   HLD (hyperlipidemia) 01/22/2015   Type 2 diabetes mellitus with circulatory disorder ([place]) 01/22/2015   Intracranial vascular stenosis 01/22/2015   Aneurysm ([place])    Headache    Diabetes mellitus with complication ([place]) 29/93/7169   Essential hypertension 11/12/2014   Dependent edema 11/12/2014   Peripheral neuropathy 11/12/2014   Depression 11/12/2014   Facial droop    Chest pain    TIA (transient ischemic attack) 11/11/2014   Dyspnea 08/06/2014   Hyperthyroidism 08/06/2014  Meniere's disease 06/07/2014   History of  CHF (congestive heart failure) 06/07/2014   Essential hypertension, benign 06/13/2013   Goiter 06/13/2013   Obesity, unspecified 06/13/2013    Lyndall Windt,ANGIE PTA 02/16/2018, 9:28 AM  [address] [place], [place], [address] Phone: [phone]   Fax:  [phone]  Name: Sherry [name] MRN: [id number] Date of Birth: [birth year]   PHYSICAL THERAPY DISCHARGE SUMMARY  Visits from Start of Care: 3 Plan: Patient agrees to discharge.  Patient goals were partially met. Patient is being discharged due to - not returning since last visit.     Lyndee Hensen, PT, DPT 8:20 AM  11/10/21

## 2018-02-21 ENCOUNTER — Encounter: Payer: Self-pay | Admitting: Physical Therapy

## 2018-02-21 ENCOUNTER — Ambulatory Visit: Payer: Medicare Other | Admitting: Physical Therapy

## 2018-02-21 DIAGNOSIS — R262 Difficulty in walking, not elsewhere classified: Secondary | ICD-10-CM

## 2018-02-21 DIAGNOSIS — M25562 Pain in left knee: Secondary | ICD-10-CM

## 2018-02-21 DIAGNOSIS — R600 Localized edema: Secondary | ICD-10-CM | POA: Diagnosis not present

## 2018-02-21 DIAGNOSIS — M25662 Stiffness of left knee, not elsewhere classified: Secondary | ICD-10-CM | POA: Diagnosis not present

## 2018-02-21 NOTE — Therapy (Signed)
[place], [place], [address] Phone: [phone]   Fax:  [phone]  Physical Therapy Treatment  Patient Details  Name: Sherry [name] MRN: [id number] Date of Birth: 1950-03-17 Referring Provider (PT): Bishop Limbo Date: 02/21/2018  PT End of Session - 02/21/18 1010    Visit Number  4    Date for PT Re-Evaluation  04/07/18    PT Start Time  0930    PT Stop Time  1020    PT Time Calculation (min)  50 min    Activity Tolerance  Patient tolerated treatment well    Behavior During Therapy  [facility] for tasks assessed/performed       Past Medical History:  Diagnosis Date  . Asthma   . Cerebral aneurysm   . Complication of anesthesia HYPOTENSION INTRAOP W/ HYSTERECTOMY IN 1987--  DID OK W/ TOTAL KNEE IN 2008  . Diabetes mellitus   . DJD (degenerative joint disease)   . Dyslipidemia   . GERD (gastroesophageal reflux disease)   . H/O hiatal hernia   . History of CHF (congestive heart failure) 2010-  RESOLVED  . History of peptic ulcer YRS AGO  . Hypertension   . Meniere's disease   . Mitral valve prolapse occasional palpitations w/ chest discomfort  . OA (osteoarthritis)   . Rotator cuff tear, left   . Stroke [facility])     Past Surgical History:  Procedure Laterality Date  . BUNIONECTOMY  2007   LEFT FOOT  . LEFT SHOULDER SURGERY  1997   ROTATOR CUFF REPAIR  . SHOULDER ARTHROSCOPY  09/20/2011   Procedure: ARTHROSCOPY SHOULDER;  Surgeon: Johnn Hai, MD;  Location[facility];  Service: Orthopedics;  Laterality: Left;  SAD AND MINI OPEN ROTATOR CUFF REPAIR    . TOTAL KNEE ARTHROPLASTY  09-12-2006  [facility]   RIGHT KNEE  . TOTAL KNEE ARTHROPLASTY Left 11/30/2017   Procedure: LEFT TOTAL KNEE REPLACEMENT;  Surgeon: [name], MD;  Location: [place];  Service: Orthopedics;  Laterality: Left;  . TUBAL LIGATION  1976  . TYMPANOPLASTY  1990;   1980  . VAGINAL  HYSTERECTOMY  1987    There were no vitals filed for this visit.  Subjective Assessment - 02/21/18 0934    Subjective  "Not much pain just stiffness"    Currently in Pain?  No/denies                       [facility] Adult PT Treatment/Exercise - 02/21/18 0001      Knee/Hip Exercises: Aerobic   Recumbent Bike  4 min    Nustep  L 5 6 min      Knee/Hip Exercises: Machines for Strengthening   Cybex Knee Extension  5# 2 sets 10    Cybex Knee Flexion  20# 2 sets 10    Cybex Leg Press  20# 2 sets 10, TKE 20lb 2x10       Knee/Hip Exercises: Standing   Forward Step Up  Left;1 set;Hand Hold: 1;Step Height: 6";10 reps      Modalities   Modalities  Cryotherapy      Cryotherapy   Number Minutes Cryotherapy  10 Minutes    Cryotherapy Location  Knee    Type of Cryotherapy  Ice pack      Manual Therapy   Manual Therapy  Passive ROM    Passive ROM  flex/ext  PT Short Term Goals - 02/14/18 0919      PT SHORT TERM GOAL #1   Title  independent with HEP    Baseline  doing ex from HHPT    Status  Achieved        PT Long Term Goals - 02/07/18 1150      PT LONG TERM GOAL #1   Title  increase AROM of the left knee to 5-115 degrees flexion    Time  12    Period  Weeks    Status  New      PT LONG TERM GOAL #2   Title  decrease pain 50%    Time  12    Period  Weeks    Status  New      PT LONG TERM GOAL #3   Title  go up and down stairs step over step    Time  12    Period  Weeks    Status  New      PT LONG TERM GOAL #4   Title  increase strength to 4+/5    Time  12    Period  Weeks    Status  New            Plan - 02/21/18 1010    Clinical Impression Statement  Pt did well with today's exercises, she reports decrease stiffness after therapy session. Good TKE strength on leg press. Overall PROM is well. Little compensation with six inch step up.     PT Treatment/Interventions  ADLs/Self Care Home Management;Electrical  Stimulation;Cryotherapy;Moist Heat;Ultrasound;Gait training;Functional mobility training;Therapeutic activities;Therapeutic exercise;Balance training;Patient/family education;Manual techniques;Vasopneumatic Device    PT Next Visit Plan  progress ROM and strength. GAIT       Patient will benefit from skilled therapeutic intervention in order to improve the following deficits and impairments:  Abnormal gait, Pain, Decreased range of motion, Decreased strength, Impaired sensation, Decreased mobility, Difficulty walking, Increased edema, Impaired flexibility  Visit Diagnosis: Acute pain of left knee  Stiffness of left knee, not elsewhere classified  Difficulty in walking, not elsewhere classified  Localized edema     Problem List Patient Active Problem List   Diagnosis Date Noted  . Arthritis of left knee 11/30/2017  . S/P total knee replacement, right 03/29/2017  . Spondylosis without myelopathy or radiculopathy, cervical region 03/29/2017  . Unilateral primary osteoarthritis, left knee 03/29/2017  . Bilateral carpal tunnel syndrome 03/29/2017  . Acute asthma exacerbation 08/19/2016  . Preoperative cardiovascular examination 08/16/2016  . Cervicalgia 03/23/2016  . Acute back pain with sciatica, right 01/14/2016  . Vertigo 04/14/2015  . Anxiety state 01/22/2015  . HLD (hyperlipidemia) 01/22/2015  . Type 2 diabetes mellitus with circulatory disorder ([place]) 01/22/2015  . Intracranial vascular stenosis 01/22/2015  . Aneurysm ([place])   . Headache   . Diabetes mellitus with complication ([place]) 96/75/9163  . Essential hypertension 11/12/2014  . Dependent edema 11/12/2014  . Peripheral neuropathy 11/12/2014  . Depression 11/12/2014  . Facial droop   . Chest pain   . TIA (transient ischemic attack) 11/11/2014  . Dyspnea 08/06/2014  . Hyperthyroidism 08/06/2014  . Meniere's disease 06/07/2014  . History of CHF (congestive heart failure) 06/07/2014  . Essential hypertension, benign  06/13/2013  . Goiter 06/13/2013  . Obesity, unspecified 06/13/2013    Scot Jun, PTA 02/21/2018, 10:13 AM  [place], [place], [address] Phone: [phone]   Fax:  [phone]  Name: Sherry [name] MRN: [id number] Date of Birth: 02/20/1950

## 2018-02-22 ENCOUNTER — Encounter (INDEPENDENT_AMBULATORY_CARE_PROVIDER_SITE_OTHER): Payer: Self-pay | Admitting: Orthopaedic Surgery

## 2018-02-22 ENCOUNTER — Ambulatory Visit (INDEPENDENT_AMBULATORY_CARE_PROVIDER_SITE_OTHER): Payer: Medicare Other | Admitting: Orthopaedic Surgery

## 2018-02-22 VITALS — BP 102/60 | HR 85

## 2018-02-22 DIAGNOSIS — Z96652 Presence of left artificial knee joint: Secondary | ICD-10-CM

## 2018-02-22 NOTE — Progress Notes (Signed)
Post-Op Visit Note   Patient: Sherry [name]           Date of Birth: March 30, 1950           MRN: [id number] Visit Date: 02/22/2018 PCP: Darreld Mclean, MD   Assessment & Plan: Post left total knee arthroplasty.  She has good flexion good strength work slip given for work resumption on 02/27/2018.  She will work on extension in prone position with the book bag on her ankle.  Follow-up here as needed.  She is happy with the surgical result.  Chief Complaint:  Chief Complaint  Patient presents with  . Left Knee - Routine Post Op, Pain   Visit Diagnoses:  1. S/P total knee arthroplasty, left     Plan: Work resumption 02/27/2018.  She will work on the last bit of extension.  Return PRN.  Follow-Up Instructions: Return if symptoms worsen or fail to improve.   Orders:  No orders of the defined types were placed in this encounter.  No orders of the defined types were placed in this encounter.   Imaging: No results found.  PMFS History: Patient Active Problem List   Diagnosis Date Noted  . S/P total knee arthroplasty, left 03/29/2017  . Spondylosis without myelopathy or radiculopathy, cervical region 03/29/2017  . Bilateral carpal tunnel syndrome 03/29/2017  . Acute asthma exacerbation 08/19/2016  . Preoperative cardiovascular examination 08/16/2016  . Cervicalgia 03/23/2016  . Acute back pain with sciatica, right 01/14/2016  . Vertigo 04/14/2015  . Anxiety state 01/22/2015  . HLD (hyperlipidemia) 01/22/2015  . Type 2 diabetes mellitus with circulatory disorder ([place]) 01/22/2015  . Intracranial vascular stenosis 01/22/2015  . Aneurysm ([place])   . Headache   . Diabetes mellitus with complication ([place]) 18/56/3149  . Essential hypertension 11/12/2014  . Dependent edema 11/12/2014  . Peripheral neuropathy 11/12/2014  . Depression 11/12/2014  . Facial droop   . Chest pain   . TIA (transient ischemic attack) 11/11/2014  . Dyspnea 08/06/2014  . Hyperthyroidism  08/06/2014  . Meniere's disease 06/07/2014  . History of CHF (congestive heart failure) 06/07/2014  . Essential hypertension, benign 06/13/2013  . Goiter 06/13/2013  . Obesity, unspecified 06/13/2013   Past Medical History:  Diagnosis Date  . Asthma   . Cerebral aneurysm   . Complication of anesthesia HYPOTENSION INTRAOP W/ HYSTERECTOMY IN 1987--  DID OK W/ TOTAL KNEE IN 2008  . Diabetes mellitus   . DJD (degenerative joint disease)   . Dyslipidemia   . GERD (gastroesophageal reflux disease)   . H/O hiatal hernia   . History of CHF (congestive heart failure) 2010-  RESOLVED  . History of peptic ulcer YRS AGO  . Hypertension   . Meniere's disease   . Mitral valve prolapse occasional palpitations w/ chest discomfort  . OA (osteoarthritis)   . Rotator cuff tear, left   . Stroke [facility])     Family History  Problem Relation Age of Onset  . Heart disease Mother        in her 55s, heart attack  . Diabetes Mother   . Kidney disease Mother        dialysis  . Thyroid disease Mother   . Aneurysm Mother   . Ovarian cancer Mother   . Breast cancer Mother   . Thyroid disease Sister   . Heart disease Sister   . Lymphoma Father   . Diabetes Father   . Hypertension Father   . Congestive Heart Failure Father   .  Thyroid disease Brother   . Thyroid disease Paternal Grandmother   . Heart attack Sister        at age 25  . Stroke Sister   . Hypertension Sister   . Breast cancer Maternal Aunt   . Breast cancer Maternal Grandmother   . Breast cancer Maternal Aunt   . Breast cancer Maternal Aunt   . Other Brother        Musician accident  . Bipolar disorder Son   . Other Son        GSW    Past Surgical History:  Procedure Laterality Date  . BUNIONECTOMY  2007   LEFT FOOT  . LEFT SHOULDER SURGERY  1997   ROTATOR CUFF REPAIR  . SHOULDER ARTHROSCOPY  09/20/2011   Procedure: ARTHROSCOPY SHOULDER;  Surgeon: Johnn Hai, MD;  Location: [facility];  Service: Orthopedics;   Laterality: Left;  SAD AND MINI OPEN ROTATOR CUFF REPAIR    . TOTAL KNEE ARTHROPLASTY  09-12-2006  [facility]   RIGHT KNEE  . TOTAL KNEE ARTHROPLASTY Left 11/30/2017   Procedure: LEFT TOTAL KNEE REPLACEMENT;  Surgeon: [name], MD;  Location: [place];  Service: Orthopedics;  Laterality: Left;  . TUBAL LIGATION  1976  . TYMPANOPLASTY  1990;   1980  . VAGINAL HYSTERECTOMY  1987   Social History   Occupational History  . Occupation: LPN - retired  Tobacco Use  . Smoking status: Never Smoker  . Smokeless tobacco: Never Used  Substance and Sexual Activity  . Alcohol use: No  . Drug use: No  . Sexual activity: Not on file

## 2018-02-24 ENCOUNTER — Ambulatory Visit: Payer: Medicare Other | Admitting: Physical Therapy

## 2018-02-28 ENCOUNTER — Ambulatory Visit: Payer: Medicare Other | Admitting: Physical Therapy

## 2018-03-02 ENCOUNTER — Ambulatory Visit: Payer: Medicare Other | Admitting: Physical Therapy

## 2018-03-21 ENCOUNTER — Other Ambulatory Visit: Payer: Self-pay | Admitting: Family Medicine

## 2018-03-21 DIAGNOSIS — E118 Type 2 diabetes mellitus with unspecified complications: Secondary | ICD-10-CM

## 2018-03-21 DIAGNOSIS — I10 Essential (primary) hypertension: Secondary | ICD-10-CM

## 2018-03-21 DIAGNOSIS — R0982 Postnasal drip: Secondary | ICD-10-CM

## 2018-03-21 DIAGNOSIS — F320 Major depressive disorder, single episode, mild: Secondary | ICD-10-CM

## 2018-03-21 MED FILL — JARDIANCE 10 MG TABLET: 10 | 90 days supply | Qty: 90 | Fill #0

## 2018-03-21 MED FILL — ACCU-CHEK AVIVA PLUS TEST S: 30 days supply | Qty: 100 | Fill #1

## 2018-03-21 MED FILL — ACCU-CHEK AVIVA PLUS W/DEVI: W/DEVICE | 30 days supply | Qty: 1 | Fill #0

## 2018-03-21 MED FILL — FUROSEMIDE 20 MG TAB: 20 | 30 days supply | Qty: 30 | Fill #4

## 2018-03-22 MED ORDER — METOPROLOL TARTRATE 25 MG PO TABS: 25.0000 mg | ORAL_TABLET | Freq: Two times a day (BID) | ORAL | 1 refills | Status: DC

## 2018-03-22 MED ORDER — LINAGLIPTIN 5 MG PO TABS: 2.5000 mg | ORAL_TABLET | Freq: Every day | ORAL | 1 refills | Status: DC

## 2018-03-22 MED ORDER — LISINOPRIL 20 MG PO TABS: 20.0000 mg | ORAL_TABLET | Freq: Every day | ORAL | 1 refills | Status: DC

## 2018-03-22 MED ORDER — GABAPENTIN 300 MG PO CAPS: 600.0000 mg | ORAL_CAPSULE | Freq: Two times a day (BID) | ORAL | 1 refills | Status: DC

## 2018-03-22 MED ORDER — EMPAGLIFLOZIN 10 MG PO TABS: ORAL_TABLET | ORAL | 1 refills | Status: DC

## 2018-03-22 MED FILL — METOPROLOL TARTRATE 25 MG T: 25 | 90 days supply | Qty: 180 | Fill #0

## 2018-03-22 MED FILL — TRADJENTA 5 MG TABLET: 5 | 90 days supply | Qty: 45 | Fill #0

## 2018-03-22 MED FILL — LISINOPRIL 20 MG TABLET: 20 | 90 days supply | Qty: 90 | Fill #0

## 2018-03-23 MED ORDER — IPRATROPIUM BROMIDE 0.03 % NA SOLN: 2.0000 | Freq: Four times a day (QID) | NASAL | 1 refills | Status: DC

## 2018-03-23 MED ORDER — LISINOPRIL 20 MG PO TABS: 20.0000 mg | ORAL_TABLET | Freq: Every day | ORAL | 1 refills | Status: DC

## 2018-03-23 MED ORDER — GABAPENTIN 300 MG PO CAPS: 600.0000 mg | ORAL_CAPSULE | Freq: Two times a day (BID) | ORAL | 1 refills | Status: DC

## 2018-03-23 MED ORDER — METOPROLOL TARTRATE 25 MG PO TABS: 25.0000 mg | ORAL_TABLET | Freq: Two times a day (BID) | ORAL | 1 refills | Status: DC

## 2018-03-23 MED ORDER — BLOOD GLUCOSE MONITOR KIT: PACK | 0 refills | Status: DC

## 2018-03-23 MED ORDER — LINAGLIPTIN 5 MG PO TABS: 2.5000 mg | ORAL_TABLET | Freq: Every day | ORAL | 1 refills | Status: DC

## 2018-03-23 MED ORDER — EMPAGLIFLOZIN 10 MG PO TABS: ORAL_TABLET | ORAL | 1 refills | Status: DC

## 2018-03-23 MED FILL — QUETIAPINE FUMARATE 100 MG: 100 | 90 days supply | Qty: 45 | Fill #0

## 2018-03-23 MED FILL — SERTRALINE HCL 50 MG TABLET: 50 | 90 days supply | Qty: 45 | Fill #0

## 2018-03-23 MED FILL — IPRATROPIUM 0.03% SPRAY: 0.03 | 86 days supply | Qty: 60 | Fill #0

## 2018-03-23 MED FILL — CLOPIDOGREL 75 MG TABLET: 75 | 90 days supply | Qty: 90 | Fill #0

## 2018-04-07 NOTE — Progress Notes (Addendum)
Subjective:   Sherry [name] is a 68 y.o. female who presents for Medicare Annual (Subsequent) preventive examination.  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 Daughters live close by and they at least talk on the phone daily and visit often.  Review of Systems: No ROS.  Medicare Wellness Visit. Additional risk factors are reflected in the social history. Cardiac Risk Factors include: advanced age (>22mn, >>23women);obesity (BMI >30kg/m2);diabetes mellitus Sleep patterns: Sleeps well with Seroquel and Zoloft. Feels rested in the morning.  Home Safety/Smoke Alarms: Feels safe in home. Smoke alarms in place.  Lives in 2 story home. Master on 1st floor. 2Orrin Brighamlives with her. Brother also lives with her.    Eye- pt states she is due for DM eye exam and she will schedule with Dr.Dunn.  Female:   Mammo- active order. Pt states she will schedule     Dexa scan- 03/22/16       CCS- last 03/05/16. Recall 10 yrs     Objective:     Vitals: BP 138/78 (BP Location: Left Arm, Patient Position: Sitting, Cuff Size: Normal)   Ht 5' 1" (1.549 m)   Wt 177 lb (80.3 kg)   BMI 33.44 kg/m   Body mass index is 33.44 kg/m.  Advanced Directives 04/10/2018 11/30/2017 11/21/2017 03/03/2017 08/20/2016 08/19/2016 03/23/2016  Does Patient Have a Medical Advance Directive? No No No Yes No No No  Type of Advance Directive - - - Living will - - -  Would patient like information on creating a medical advance directive? No - Patient declined No - Patient declined No - Patient declined - No - Patient declined - No - Patient declined  Pre-existing out of facility DNR order (yellow form or pink MOST form) - - - - - - -    Tobacco Social History   Tobacco Use  Smoking Status Never Smoker  Smokeless Tobacco Never Used     Counseling given: Not Answered   Clinical Intake:     Pain : No/denies pain                 Past Medical History:  Diagnosis Date  . Asthma     . Cerebral aneurysm   . Complication of anesthesia HYPOTENSION INTRAOP W/ HYSTERECTOMY IN 1987--  DID OK W/ TOTAL KNEE IN 2008  . Diabetes mellitus   . DJD (degenerative joint disease)   . Dyslipidemia   . GERD (gastroesophageal reflux disease)   . H/O hiatal hernia   . History of CHF (congestive heart failure) 2010-  RESOLVED  . History of peptic ulcer YRS AGO  . Hypertension   . Meniere's disease   . Mitral valve prolapse occasional palpitations w/ chest discomfort  . OA (osteoarthritis)   . Rotator cuff tear, left   . Stroke ([facility]    Past Surgical History:  Procedure Laterality Date  . BUNIONECTOMY  2007   LEFT FOOT  . LEFT SHOULDER SURGERY  1997   ROTATOR CUFF REPAIR  . SHOULDER ARTHROSCOPY  09/20/2011   Procedure: ARTHROSCOPY SHOULDER;  Surgeon: JJohnn Hai MD;  [facility]  Service: [facility]opedics;  Laterality: Left;  SAD AND MINI OPEN ROTATOR CUFF REPAIR    . [facility]  . TOTAL KNEE ARTHROPLASTY Left 11/30/2017   Procedure: LEFT TOTAL KNEE REPLACEMENT;  Surgeon: YMarybelle Killings MD;  [facility]  Service: Orthopedics;  Laterality: Left;  . TUBAL LIGATION  1976  . TYMPANOPLASTY  1990;   1980  . VAGINAL HYSTERECTOMY  1987   Family History  Problem Relation Age of Onset  . Heart disease Mother        in her 51s, heart attack  . Diabetes Mother   . Kidney disease Mother        dialysis  . Thyroid disease Mother   . Aneurysm Mother   . Ovarian cancer Mother   . Breast cancer Mother   . Thyroid disease Sister   . Heart disease Sister   . Lymphoma Father   . Diabetes Father   . Hypertension Father   . Congestive Heart Failure Father   . Thyroid disease Brother   . Thyroid disease Paternal Grandmother   . Heart attack Sister        at age 61  . Stroke Sister   . Hypertension Sister   . Breast cancer Maternal Aunt   . Breast cancer Maternal Grandmother   . Breast cancer Maternal Aunt    . Breast cancer Maternal Aunt   . Other Brother        Musician accident  . Bipolar disorder Son   . Other Son        GSW   Social History   Socioeconomic History  . Marital status: Widowed    Spouse name: Not on file  . Number of children: 4  . Years of education: BA  . Highest education level: Not on file  Occupational History  . Occupation: LPN - retired  Scientific laboratory technician  . Financial resource strain: Not on file  . Food insecurity:    Worry: Not on file    Inability: Not on file  . Transportation needs:    Medical: Not on file    Non-medical: Not on file  Tobacco Use  . Smoking status: Never Smoker  . Smokeless tobacco: Never Used  Substance and Sexual Activity  . Alcohol use: No  . Drug use: No  . Sexual activity: Not on file  Lifestyle  . Physical activity:    Days per week: Not on file    Minutes per session: Not on file  . Stress: Not on file  Relationships  . Social connections:    Talks on phone: Not on file    Gets together: Not on file    Attends religious service: Not on file    Active member of club or organization: Not on file    Attends meetings of clubs or organizations: Not on file    Relationship status: Not on file  Other Topics Concern  . Not on file  Social History Narrative  . Not on file    Outpatient Encounter Medications as of 04/10/2018  Medication Sig  . albuterol (PROAIR HFA) 108 (90 Base) MCG/ACT inhaler Inhale 1-2 puffs into the lungs every 6 (six) hours as needed for wheezing or shortness of breath.  Marland Kitchen albuterol (PROVENTIL) (2.5 MG/3ML) 0.083% nebulizer solution Take 3 mLs (2.5 mg total) by nebulization every 6 (six) hours as needed for wheezing or shortness of breath.  Marland Kitchen Bioflavonoid Products (VITAMIN C) CHEW Chew 2 each by mouth daily.  . blood glucose meter kit and supplies KIT Dispense based on patient and insurance preference. Use up to four times daily as directed. (FOR ICD-9 250.00, 250.01). Pt uses once a day  . cetirizine  (ZYRTEC) 10 MG tablet Take 1 tablet (10 mg total)  by mouth daily.  . clopidogrel (PLAVIX) 75 MG tablet Take 1 tablet (75 mg total) by mouth daily.  . empagliflozin (JARDIANCE) 10 MG TABS tablet TAKE 10 mg TABLET BY MOUTH  DAILY  . famotidine (PEPCID) 10 MG tablet Take 10 mg by mouth daily.  . fluticasone (FLOVENT HFA) 220 MCG/ACT inhaler Inhale 1 puff into the lungs 2 (two) times daily. May increase to 2 puffs if needed (Patient taking differently: Inhale 2 puffs into the lungs 2 (two) times daily as needed (for shorness of breath or wheezing). )  . furosemide (LASIX) 20 MG tablet Take 1 tablet (20 mg total) by mouth daily.  Marland Kitchen gabapentin (NEURONTIN) 300 MG capsule Take 2 capsules (600 mg total) by mouth 2 (two) times daily.  Marland Kitchen glucose blood (ACCU-CHEK AVIVA PLUS) test strip Use as instructed  . HYDROcodone-acetaminophen (NORCO/VICODIN) 5-325 MG tablet Norco 5-325 1 PO TID PRN FOR PAIN  . ipratropium (ATROVENT) 0.03 % nasal spray Place 2 sprays into the nose 4 (four) times daily.  . Lancets Misc. (ACCU-CHEK SOFTCLIX LANCET DEV) KIT USE AS DIRECTED  . linagliptin (TRADJENTA) 5 MG TABS tablet Take 0.5 tablets (2.5 mg total) by mouth daily.  Marland Kitchen lisinopril (PRINIVIL,ZESTRIL) 20 MG tablet Take 1 tablet (20 mg total) by mouth daily.  . metFORMIN (GLUCOPHAGE) 500 MG tablet Take 2 tablets (1,000 mg total) by mouth 2 (two) times daily.  . metoprolol tartrate (LOPRESSOR) 25 MG tablet Take 1 tablet (25 mg total) by mouth 2 (two) times daily.  . metoprolol tartrate (LOPRESSOR) 25 MG tablet TAKE 1 TABLET (25 MG TOTAL) BY MOUTH 2 (TWO) TIMES DAILY.  . Multiple Vitamins-Minerals (MULTIVITAMIN PO) Take 2 tablets by mouth daily.  . QUEtiapine (SEROQUEL) 100 MG tablet TAKE 1/2 TABLET (50 MG TOTAL) BY MOUTH AT BEDTIME.  Marland Kitchen sertraline (ZOLOFT) 50 MG tablet Take 0.5 tablets (25 mg total) by mouth at bedtime.  Marland Kitchen aspirin (BAYER ASPIRIN) 325 MG tablet Take 1 tablet (325 mg total) by mouth daily. (Patient not taking:  Reported on 04/10/2018)  . docusate sodium (COLACE) 100 MG capsule Take 1 capsule (100 mg total) by mouth daily. (Patient not taking: Reported on 04/10/2018)  . empagliflozin (JARDIANCE) 10 MG TABS tablet TAKE 10 mg TABLET BY MOUTH  DAILY  . methocarbamol (ROBAXIN) 500 MG tablet Take 1 tablet (500 mg total) by mouth 3 (three) times daily. (Patient not taking: Reported on 04/10/2018)  . oxyCODONE-acetaminophen (PERCOCET) 5-325 MG tablet Take 1 tablet by mouth every 4 (four) hours as needed for severe pain. (Patient not taking: Reported on 04/10/2018)  . [DISCONTINUED] gabapentin (NEURONTIN) 300 MG capsule Take 2 capsules (600 mg total) by mouth 2 (two) times daily.  . [DISCONTINUED] gabapentin (NEURONTIN) 300 MG capsule TAKE 2 CAPSULES (600 MG TOTAL) BY MOUTH 2 (TWO) TIMES DAILY.  . [DISCONTINUED] linagliptin (TRADJENTA) 5 MG TABS tablet Take 0.5 tablets (2.5 mg total) by mouth daily.  . [DISCONTINUED] lisinopril (PRINIVIL,ZESTRIL) 20 MG tablet Take 1 tablet (20 mg total) by mouth daily.  . [DISCONTINUED] lisinopril (PRINIVIL,ZESTRIL) 20 MG tablet TAKE 1 TABLET (20 MG TOTAL) BY MOUTH DAILY.  . [DISCONTINUED] metoprolol tartrate (LOPRESSOR) 25 MG tablet Take 1 tablet (25 mg total) by mouth 2 (two) times daily.  . [DISCONTINUED] montelukast (SINGULAIR) 10 MG tablet Take 1 tablet (10 mg total) by mouth at bedtime.   No facility-administered encounter medications on file as of 04/10/2018.     Activities of Daily Living In your present state of health, do you have any  difficulty performing the following activities: 04/10/2018 11/30/2017  Hearing? N -  Vision? N -  Difficulty concentrating or making decisions? N -  Walking or climbing stairs? N -  Dressing or bathing? N -  Doing errands, shopping? N N  Preparing Food and eating ? N -  Using the Toilet? N -  In the past six months, have you accidently leaked urine? Y -  Do you have problems with loss of bowel control? N -  Managing your Medications? N -    Managing your Finances? N -  Housekeeping or managing your Housekeeping? N -  Some recent data might be hidden    Patient Care Team: Copland, Gay Filler, MD as PCP - General (Family Medicine) Debara Pickett Nadean Corwin, MD as PCP - Cardiology (Cardiology)    Assessment:   This is a routine wellness examination for Sherry [name]. Physical assessment deferred to PCP.  Exercise Activities and Dietary recommendations Current Exercise Habits: The patient does not participate in regular exercise at present, Exercise limited by: None identified Diet (meal preparation, eat out, water intake, caffeinated beverages, dairy products, fruits and vegetables): well balanced Breakfast: oatmeal or yogurt and fruit. Lunch: skips Dinner: varies. A lot of vegetables. Drinks plenty of water.  Goals    . Increase physical activity       Fall Risk Fall Risk  04/10/2018 02/14/2017 12/06/2016 05/26/2015 01/22/2015  Falls in the past year? 0 Yes Yes Yes No  Number falls in past yr: - 2 or more 1 1 -  Comment - 3 falls - - -  Injury with Fall? - No Yes No -     Depression Screen PHQ 2/9 Scores 04/10/2018 12/06/2016 02/18/2015 11/20/2014  PHQ - 2 Score 0 _0 PHQ- 9 Score - _1 Exception Documentation - Other- indicate reason in comment box - -  Not completed - Loss of loved ones - -     Cognitive Function MMSE - Mini Mental State Exam 04/10/2018  Orientation to time 5  Orientation to Place 5  Registration 3  Attention/ Calculation 5  Recall 3  Language- name 2 objects 2  Language- repeat 1  Language- follow 3 step command 3  Language- read & follow direction 1  Write a sentence 1  Copy design 1  Total score 30        Immunization History  Administered Date(s) Administered  . PPD Test 06/29/2013, 02/18/2015  . Pneumococcal Conjugate-13 12/06/2016  . Pneumococcal Polysaccharide-23 11/21/2009, 02/15/2018   Screening Tests Health Maintenance  Topic Date Due  . TETANUS/TDAP  01/07/1970  .  MAMMOGRAM  07/16/2017  . OPHTHALMOLOGY EXAM  11/22/2017  . HEMOGLOBIN A1C  08/16/2018  . FOOT EXAM  11/15/2018  . COLONOSCOPY  03/05/2026  . DEXA SCAN  Completed  . Hepatitis C Screening  Completed  . PNA vac Low Risk Adult  Completed  . INFLUENZA VACCINE  Discontinued      Plan:    Please schedule your next medicare wellness visit with me in 1 yr.  Continue to eat heart healthy diet (full of fruits, vegetables, whole grains, lean protein, water--limit salt, fat, and sugar intake) and increase physical activity as tolerated.  Continue doing brain stimulating activities (puzzles, reading, adult coloring books, staying active) to keep memory sharp.   Bring a copy of your living will and/or healthcare power of attorney to your next office visit.  Please schedule eye exam and mammogram.  I have personally reviewed and noted  the following in the patient's chart:   . Medical and social history . Use of alcohol, tobacco or illicit drugs  . Current medications and supplements . Functional ability and status . Nutritional status . Physical activity . Advanced directives . List of other physicians . Hospitalizations, surgeries, and ER visits in previous 12 months . Vitals . Screenings to include cognitive, depression, and falls . Referrals and appointments  In addition, I have reviewed and discussed with patient certain preventive protocols, quality metrics, and best practice recommendations. A written personalized care plan for preventive services as well as general preventive health recommendations were provided to patient.     Shela Nevin, RN  04/10/2018  I have reviewed the above MWE by Ms. Vevelyn Royals and agree with her documentation Denny Peon MD

## 2018-04-10 ENCOUNTER — Encounter: Payer: Self-pay | Admitting: *Deleted

## 2018-04-10 ENCOUNTER — Ambulatory Visit (INDEPENDENT_AMBULATORY_CARE_PROVIDER_SITE_OTHER): Payer: Medicare Other | Admitting: *Deleted

## 2018-04-10 VITALS — BP 138/78 | HR 60 | Ht 61.0 in | Wt 177.0 lb

## 2018-04-10 DIAGNOSIS — Z0000 Encounter for general adult medical examination without abnormal findings: Secondary | ICD-10-CM | POA: Diagnosis not present

## 2018-04-10 MED FILL — GABAPENTIN 300 MG CAPSULE: 300 | 90 days supply | Qty: 360 | Fill #0

## 2018-04-10 NOTE — Patient Instructions (Signed)
Please schedule your next medicare wellness visit with me in 1 yr.  Continue to eat heart healthy diet (full of fruits, vegetables, whole grains, lean protein, water--limit salt, fat, and sugar intake) and increase physical activity as tolerated.  Continue doing brain stimulating activities (puzzles, reading, adult coloring books, staying active) to keep memory sharp.   Bring a copy of your living will and/or healthcare power of attorney to your next office visit.  Please schedule eye exam and mammogram.   Sherry [name] , Thank you for taking time to come for your Medicare Wellness Visit. I appreciate your ongoing commitment to your health goals. Please review the following plan we discussed and let me know if I can assist you in the future.   These are the goals we discussed: Goals    . Increase physical activity       This is a list of the screening recommended for you and due dates:  Health Maintenance  Topic Date Due  . Tetanus Vaccine  01/07/1970  . Mammogram  07/16/2017  . Eye exam for diabetics  11/22/2017  . Hemoglobin A1C  08/16/2018  . Complete foot exam   11/15/2018  . Colon Cancer Screening  03/05/2026  . DEXA scan (bone density measurement)  Completed  .  Hepatitis C: One time screening is recommended by Center for Disease Control  (CDC) for  adults born from [birth year] through 1965.   Completed  . Pneumonia vaccines  Completed  . Flu Shot  Discontinued    Health Maintenance After Age 52 After age 38, you are at a higher risk for certain long-term diseases and infections as well as injuries from falls. Falls are a major cause of broken bones and head injuries in people who are older than age 33. Getting regular preventive care can help to keep you healthy and well. Preventive care includes getting regular testing and making lifestyle changes as recommended by your health care provider. Talk with your health care provider about:  Which screenings and tests you should have. A  screening is a test that checks for a disease when you have no symptoms.  A diet and exercise plan that is right for you. What should I know about screenings and tests to prevent falls? Screening and testing are the best ways to find a health problem early. Early diagnosis and treatment give you the best chance of managing medical conditions that are common after age 52. Certain conditions and lifestyle choices may make you more likely to have a fall. Your health care provider may recommend:  Regular vision checks. Poor vision and conditions such as cataracts can make you more likely to have a fall. If you wear glasses, make sure to get your prescription updated if your vision changes.  Medicine review. Work with your health care provider to regularly review all of the medicines you are taking, including over-the-counter medicines. Ask your health care provider about any side effects that may make you more likely to have a fall. Tell your health care provider if any medicines that you take make you feel dizzy or sleepy.  Osteoporosis screening. Osteoporosis is a condition that causes the bones to get weaker. This can make the bones weak and cause them to break more easily.  Blood pressure screening. Blood pressure changes and medicines to control blood pressure can make you feel dizzy.  Strength and balance checks. Your health care provider may recommend certain tests to check your strength and balance while standing, walking,  or changing positions.  Foot health exam. Foot pain and numbness, as well as not wearing proper footwear, can make you more likely to have a fall.  Depression screening. You may be more likely to have a fall if you have a fear of falling, feel emotionally low, or feel unable to do activities that you used to do.  Alcohol use screening. Using too much alcohol can affect your balance and may make you more likely to have a fall. What actions can I take to lower my risk of  falls? General instructions  Talk with your health care provider about your risks for falling. Tell your health care provider if: ? You fall. Be sure to tell your health care provider about all falls, even ones that seem minor. ? You feel dizzy, sleepy, or off-balance.  Take over-the-counter and prescription medicines only as told by your health care provider. These include any supplements.  Eat a healthy diet and maintain a healthy weight. A healthy diet includes low-fat dairy products, low-fat (lean) meats, and fiber from whole grains, beans, and lots of fruits and vegetables. Home safety  Remove any tripping hazards, such as rugs, cords, and clutter.  Install safety equipment such as grab bars in bathrooms and safety rails on stairs.  Keep rooms and walkways well-lit. Activity   Follow a regular exercise program to stay fit. This will help you maintain your balance. Ask your health care provider what types of exercise are appropriate for you.  If you need a cane or walker, use it as recommended by your health care provider.  Wear supportive shoes that have nonskid soles. Lifestyle  Do not drink alcohol if your health care provider tells you not to drink.  If you drink alcohol, limit how much you have: ? 0-1 drink a day for women. ? 0-2 drinks a day for men.  Be aware of how much alcohol is in your drink. In the [place] one drink equals one typical bottle of beer (12 oz), one-half glass of wine (5 oz), or one shot of hard liquor (1 oz).  Do not use any products that contain nicotine or tobacco, such as cigarettes and e-cigarettes. If you need help quitting, ask your health care provider. Summary  Having a healthy lifestyle and getting preventive care can help to protect your health and wellness after age 40.  Screening and testing are the best way to find a health problem early and help you avoid having a fall. Early diagnosis and treatment give you the best chance for  managing medical conditions that are more common for people who are older than age 70.  Falls are a major cause of broken bones and head injuries in people who are older than age 63. Take precautions to prevent a fall at home.  Work with your health care provider to learn what changes you can make to improve your health and wellness and to prevent falls. This information is not intended to replace advice given to you by your health care provider. Make sure you discuss any questions you have with your health care provider. Document Released: 12/08/2016 Document Revised: 12/08/2016 Document Reviewed: 12/08/2016 Elsevier Interactive Patient Education  2019 [employer].

## 2018-04-12 NOTE — Progress Notes (Deleted)
[place] [facility] [address], [place], [place] [address] [phone]  Date:  04/17/2018   Name:  Sherry [name]   DOB:  [birth year]   MRN:  [id number]  PCP:  Darreld Mclean, MD    Chief Complaint: No chief complaint on file.   History of Present Illness:  Sherry [name] is a 68 y.o. very pleasant female patient who presents with the following:  History of hypertension, TIA in 2016, well controlled diabetes, brain aneurysm.  Aneurysm was stable on MRI 1 year ago Here today with concern of body pains  I last saw her in January for physical Eye exam:  Her husband died last year, which is been a difficult transition.  She is also raising a young adult grandson who has been challenging.  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 She was also seen by her neurologist in October They advised her to continue her Plavix, with a 5-day break prior to her operation.  They also recommended that we maintain her blood pressure less than 130/80, A1c less than 6-1/2 an LDL under 70 for secondary stroke prevention  Patient Active Problem List   Diagnosis Date Noted  . S/P total knee arthroplasty, left 03/29/2017  . Spondylosis without myelopathy or radiculopathy, cervical region 03/29/2017  . Bilateral carpal tunnel syndrome 03/29/2017  . Acute asthma exacerbation 08/19/2016  . Preoperative cardiovascular examination 08/16/2016  . Cervicalgia 03/23/2016  . Acute back pain with sciatica, right 01/14/2016  . Vertigo 04/14/2015  . Anxiety state 01/22/2015  . HLD (hyperlipidemia) 01/22/2015  . Type 2 diabetes mellitus with circulatory disorder ([place]) 01/22/2015  . Intracranial vascular stenosis 01/22/2015  . Aneurysm ([place])   . Headache   . Diabetes mellitus with complication ([place]) 01/31/8249  . Essential hypertension 11/12/2014  . Dependent edema 11/12/2014  . Peripheral neuropathy 11/12/2014  .  Depression 11/12/2014  . Facial droop   . Chest pain   . TIA (transient ischemic attack) 11/11/2014  . Dyspnea 08/06/2014  . Hyperthyroidism 08/06/2014  . Meniere's disease 06/07/2014  . History of CHF (congestive heart failure) 06/07/2014  . Essential hypertension, benign 06/13/2013  . Goiter 06/13/2013  . Obesity, unspecified 06/13/2013    Past Medical History:  Diagnosis Date  . Asthma   . Cerebral aneurysm   . Complication of anesthesia HYPOTENSION INTRAOP W/ HYSTERECTOMY IN 1987--  DID OK W/ TOTAL KNEE IN 2008  . Diabetes mellitus   . DJD (degenerative joint disease)   . Dyslipidemia   . GERD (gastroesophageal reflux disease)   . H/O hiatal hernia   . History of CHF (congestive heart failure) 2010-  RESOLVED  . History of peptic ulcer YRS AGO  . Hypertension   . Meniere's disease   . Mitral valve prolapse occasional palpitations w/ chest discomfort  . OA (osteoarthritis)   . Rotator cuff tear, left   . Stroke [facility])     Past Surgical History:  Procedure Laterality Date  . BUNIONECTOMY  2007   LEFT FOOT  . LEFT SHOULDER SURGERY  1997   ROTATOR CUFF REPAIR  . SHOULDER ARTHROSCOPY  09/20/2011   Procedure: ARTHROSCOPY SHOULDER;  Surgeon: Johnn Hai, MD;  Location: [facility];  Service: Orthopedics;  Laterality: Left;  SAD AND MINI OPEN ROTATOR CUFF REPAIR    . TOTAL KNEE ARTHROPLASTY  09-12-2006  [facility]   RIGHT KNEE  . TOTAL KNEE ARTHROPLASTY  Left 11/30/2017   Procedure: LEFT TOTAL KNEE REPLACEMENT;  Surgeon: [name], MD;  Location: [place];  Service: Orthopedics;  Laterality: Left;  . TUBAL LIGATION  1976  . TYMPANOPLASTY  1990;   1980  . VAGINAL HYSTERECTOMY  1987    Social History   Tobacco Use  . Smoking status: Never Smoker  . Smokeless tobacco: Never Used  Substance Use Topics  . Alcohol use: No  . Drug use: No    Family History  Problem Relation Age of Onset  . Heart disease Mother        in her 6s, heart attack  .  Diabetes Mother   . Kidney disease Mother        dialysis  . Thyroid disease Mother   . Aneurysm Mother   . Ovarian cancer Mother   . Breast cancer Mother   . Thyroid disease Sister   . Heart disease Sister   . Lymphoma Father   . Diabetes Father   . Hypertension Father   . Congestive Heart Failure Father   . Thyroid disease Brother   . Thyroid disease Paternal Grandmother   . Heart attack Sister        at age 56  . Stroke Sister   . Hypertension Sister   . Breast cancer Maternal Aunt   . Breast cancer Maternal Grandmother   . Breast cancer Maternal Aunt   . Breast cancer Maternal Aunt   . Other Brother        Musician accident  . Bipolar disorder Son   . Other Son        GSW    Allergies  Allergen Reactions  . Influenza Vaccines Anaphylaxis  . Demerol Other (See Comments)    SEIZURE  . Nsaids Other (See Comments)    NAPROXEN, IBUPROFEN, SKELAXIN---  CAUSES PALPITATIONS  . Prednisone Other (See Comments)    Caused her glucose to go up to 500 and went to ED  . Tramadol Other (See Comments)    Contraindicated with Victoza   . Codeine Itching  . Mango Flavor [Flavoring Agent] Itching    Medication list has been reviewed and updated.  Current Outpatient Medications on File Prior to Visit  Medication Sig Dispense Refill  . albuterol (PROAIR HFA) 108 (90 Base) MCG/ACT inhaler Inhale 1-2 puffs into the lungs every 6 (six) hours as needed for wheezing or shortness of breath. 18 g 6  . albuterol (PROVENTIL) (2.5 MG/3ML) 0.083% nebulizer solution Take 3 mLs (2.5 mg total) by nebulization every 6 (six) hours as needed for wheezing or shortness of breath. 150 mL 1  . aspirin (BAYER ASPIRIN) 325 MG tablet Take 1 tablet (325 mg total) by mouth daily. (Patient not taking: Reported on 04/10/2018) 30 tablet   . Bioflavonoid Products (VITAMIN C) CHEW Chew 2 each by mouth daily.    . blood glucose meter kit and supplies KIT Dispense based on patient and insurance preference. Use up to four  times daily as directed. (FOR ICD-9 250.00, 250.01). Pt uses once a day 1 each 0  . cetirizine (ZYRTEC) 10 MG tablet Take 1 tablet (10 mg total) by mouth daily. 90 tablet 3  . clopidogrel (PLAVIX) 75 MG tablet Take 1 tablet (75 mg total) by mouth daily. 90 tablet 1  . docusate sodium (COLACE) 100 MG capsule Take 1 capsule (100 mg total) by mouth daily. (Patient not taking: Reported on 04/10/2018) 10 capsule 2  . empagliflozin (JARDIANCE) 10 MG TABS tablet  TAKE 10 mg TABLET BY MOUTH  DAILY 90 tablet 1  . empagliflozin (JARDIANCE) 10 MG TABS tablet TAKE 10 mg TABLET BY MOUTH  DAILY 90 tablet 1  . famotidine (PEPCID) 10 MG tablet Take 10 mg by mouth daily.    . fluticasone (FLOVENT HFA) 220 MCG/ACT inhaler Inhale 1 puff into the lungs 2 (two) times daily. May increase to 2 puffs if needed (Patient taking differently: Inhale 2 puffs into the lungs 2 (two) times daily as needed (for shorness of breath or wheezing). ) 1 Inhaler 12  . furosemide (LASIX) 20 MG tablet Take 1 tablet (20 mg total) by mouth daily. 30 tablet 5  . gabapentin (NEURONTIN) 300 MG capsule Take 2 capsules (600 mg total) by mouth 2 (two) times daily. 360 capsule 1  . glucose blood (ACCU-CHEK AVIVA PLUS) test strip Use as instructed 100 each 12  . HYDROcodone-acetaminophen (NORCO/VICODIN) 5-325 MG tablet Norco 5-325 1 PO TID PRN FOR PAIN 30 tablet 0  . ipratropium (ATROVENT) 0.03 % nasal spray Place 2 sprays into the nose 4 (four) times daily. 60 mL 1  . Lancets Misc. (ACCU-CHEK SOFTCLIX LANCET DEV) KIT USE AS DIRECTED 1 kit 0  . linagliptin (TRADJENTA) 5 MG TABS tablet Take 0.5 tablets (2.5 mg total) by mouth daily. 45 tablet 1  . lisinopril (PRINIVIL,ZESTRIL) 20 MG tablet Take 1 tablet (20 mg total) by mouth daily. 90 tablet 1  . metFORMIN (GLUCOPHAGE) 500 MG tablet Take 2 tablets (1,000 mg total) by mouth 2 (two) times daily. 360 tablet 1  . methocarbamol (ROBAXIN) 500 MG tablet Take 1 tablet (500 mg total) by mouth 3 (three) times  daily. (Patient not taking: Reported on 04/10/2018) 40 tablet 0  . metoprolol tartrate (LOPRESSOR) 25 MG tablet Take 1 tablet (25 mg total) by mouth 2 (two) times daily. 180 tablet 1  . metoprolol tartrate (LOPRESSOR) 25 MG tablet TAKE 1 TABLET (25 MG TOTAL) BY MOUTH 2 (TWO) TIMES DAILY. 180 tablet 0  . Multiple Vitamins-Minerals (MULTIVITAMIN PO) Take 2 tablets by mouth daily.    Marland Kitchen oxyCODONE-acetaminophen (PERCOCET) 5-325 MG tablet Take 1 tablet by mouth every 4 (four) hours as needed for severe pain. (Patient not taking: Reported on 04/10/2018) 40 tablet 0  . QUEtiapine (SEROQUEL) 100 MG tablet TAKE 1/2 TABLET (50 MG TOTAL) BY MOUTH AT BEDTIME. 45 tablet 1  . sertraline (ZOLOFT) 50 MG tablet Take 0.5 tablets (25 mg total) by mouth at bedtime. 45 tablet 1   No current facility-administered medications on file prior to visit.     Review of Systems:  As per HPI- otherwise negative.   Physical Examination: There were no vitals filed for this visit. There were no vitals filed for this visit. There is no height or weight on file to calculate BMI. Ideal Body Weight:    GEN: WDWN, NAD, Non-toxic, A & O x 3 HEENT: Atraumatic, Normocephalic. Neck supple. No masses, No LAD. Ears and Nose: No external deformity. CV: RRR, No M/G/R. No JVD. No thrill. No extra heart sounds. PULM: CTA B, no wheezes, crackles, rhonchi. No retractions. No resp. distress. No accessory muscle use. ABD: S, NT, ND, +BS. No rebound. No HSM. EXTR: No c/c/e NEURO Normal gait.  PSYCH: Normally interactive. Conversant. Not depressed or anxious appearing.  Calm demeanor.    Assessment and Plan: ***  Signed [name], MD

## 2018-04-14 ENCOUNTER — Ambulatory Visit: Payer: Medicare Other

## 2018-04-17 ENCOUNTER — Ambulatory Visit: Payer: Medicare Other | Admitting: Family Medicine

## 2018-04-26 MED FILL — FUROSEMIDE 20 MG TAB: 20 | 30 days supply | Qty: 30 | Fill #5

## 2018-05-19 ENCOUNTER — Other Ambulatory Visit: Payer: Self-pay | Admitting: Family Medicine

## 2018-05-19 DIAGNOSIS — I10 Essential (primary) hypertension: Secondary | ICD-10-CM

## 2018-05-22 MED FILL — FUROSEMIDE 20 MG TAB: 20 | 30 days supply | Qty: 30 | Fill #0

## 2018-06-22 MED FILL — GABAPENTIN 300 MG CAPSULE: 300 | 90 days supply | Qty: 360 | Fill #1

## 2018-06-22 MED FILL — QUETIAPINE FUMARATE 100 MG: 100 | 90 days supply | Qty: 45 | Fill #1

## 2018-06-22 MED FILL — SERTRALINE HCL 50 MG TABLET: 50 | 90 days supply | Qty: 45 | Fill #1

## 2018-06-22 MED FILL — LISINOPRIL 20 MG TABLET: 20 | 90 days supply | Qty: 90 | Fill #1

## 2018-06-22 MED FILL — FUROSEMIDE 20 MG TAB: 20 | 30 days supply | Qty: 30 | Fill #1

## 2018-06-22 MED FILL — CLOPIDOGREL 75 MG TABLET: 75 | 90 days supply | Qty: 90 | Fill #1

## 2018-06-22 MED FILL — METOPROLOL TARTRATE 25 MG T: 25 | 90 days supply | Qty: 180 | Fill #1

## 2018-07-18 MED FILL — TRADJENTA 5 MG TABLET: 5 | 90 days supply | Qty: 45 | Fill #0

## 2018-07-18 MED FILL — metFORMIN HCL 500 MG TABS: 500 | 90 days supply | Qty: 360 | Fill #0

## 2018-07-24 MED FILL — FUROSEMIDE 20 MG TAB: 20 | 30 days supply | Qty: 30 | Fill #2

## 2018-07-26 ENCOUNTER — Telehealth: Payer: Self-pay | Admitting: Family Medicine

## 2018-07-26 DIAGNOSIS — I10 Essential (primary) hypertension: Secondary | ICD-10-CM

## 2018-07-26 NOTE — Telephone Encounter (Signed)
Pt is requesting a 90 day supply for her lasix medication sent to [facility]

## 2018-07-27 MED ORDER — FUROSEMIDE 20 MG PO TABS: 20.0000 mg | ORAL_TABLET | Freq: Every day | ORAL | 1 refills | Status: DC

## 2018-07-27 NOTE — Telephone Encounter (Signed)
Refill sent to pharmacy.   

## 2018-09-01 MED FILL — IPRATROPIUM 0.03% SPRAY: 0.03 | 86 days supply | Qty: 60 | Fill #1

## 2018-09-01 MED FILL — FUROSEMIDE 20 MG TAB: 20 | 30 days supply | Qty: 30 | Fill #3

## 2018-09-25 ENCOUNTER — Other Ambulatory Visit: Payer: Self-pay | Admitting: Family Medicine

## 2018-09-25 DIAGNOSIS — E118 Type 2 diabetes mellitus with unspecified complications: Secondary | ICD-10-CM

## 2018-09-25 MED FILL — LISINOPRIL 20 MG TABLET: 20 | 90 days supply | Qty: 90 | Fill #0

## 2018-09-25 MED FILL — JARDIANCE 10 MG TABLET: 10 | 90 days supply | Qty: 90 | Fill #0

## 2018-09-25 MED FILL — GABAPENTIN 300 MG CAPSULE: 300 | 90 days supply | Qty: 360 | Fill #0

## 2018-09-25 MED FILL — CLOPIDOGREL 75 MG TABLET: 75 | 90 days supply | Qty: 90 | Fill #0

## 2018-09-25 MED FILL — QUETIAPINE FUMARATE 100 MG: 100 | 90 days supply | Qty: 45 | Fill #0

## 2018-09-25 MED FILL — TRADJENTA 5 MG TABLET: 5 | 90 days supply | Qty: 45 | Fill #1

## 2018-09-25 MED FILL — metFORMIN HCL 500 MG TABS: 500 | 90 days supply | Qty: 360 | Fill #1

## 2018-09-25 MED FILL — METOPROLOL TARTRATE 25 MG T: 25 | 90 days supply | Qty: 180 | Fill #0

## 2018-09-25 MED FILL — FUROSEMIDE 20 MG TAB: 20 | 30 days supply | Qty: 30 | Fill #4

## 2018-09-26 ENCOUNTER — Other Ambulatory Visit: Payer: Self-pay

## 2018-09-26 ENCOUNTER — Ambulatory Visit (INDEPENDENT_AMBULATORY_CARE_PROVIDER_SITE_OTHER): Payer: Medicare Other | Admitting: Family Medicine

## 2018-09-26 ENCOUNTER — Encounter: Payer: Self-pay | Admitting: Family Medicine

## 2018-09-26 VITALS — BP 118/80 | HR 64 | Temp 96.2°F | Resp 18 | Ht 61.0 in | Wt 181.4 lb

## 2018-09-26 DIAGNOSIS — M79662 Pain in left lower leg: Secondary | ICD-10-CM

## 2018-09-26 DIAGNOSIS — S8012XA Contusion of left lower leg, initial encounter: Secondary | ICD-10-CM | POA: Diagnosis not present

## 2018-09-26 MED ORDER — METHOCARBAMOL 500 MG PO TABS: 500.0000 mg | ORAL_TABLET | Freq: Three times a day (TID) | ORAL | 0 refills | Status: DC

## 2018-09-26 MED FILL — METHOCARBAMOL 500 MG TABS: 500 | 13 days supply | Qty: 40 | Fill #0

## 2018-09-26 NOTE — Assessment & Plan Note (Signed)
Warm compresses Elevate Rest

## 2018-09-26 NOTE — Patient Instructions (Signed)
Hematoma A hematoma is a collection of blood under the skin, in an organ, in a body space, in a joint space, or in other tissue. The blood can thicken (clot) to form a lump that you can see and feel. The lump is often firm and may become sore and tender. Most hematomas get better in a few days to weeks. However, some hematomas may be serious and require medical care. Hematomas can range from very small to very large. What are the causes? This condition is caused by:  A blunt or penetrating injury.  A leakage from a blood vessel under the skin.  Some medical procedures, including surgeries, such as oral surgery, face lifts, and surgeries on the joints.  Some medical conditions that cause bleeding or bruising. There may be multiple hematomas that appear in different areas of the body. What increases the risk? You are more likely to develop this condition if:  You are an older adult.  You use blood thinners. What are the signs or symptoms?  Symptoms of this condition depend on where the hematoma is located.  Common symptoms of a hematoma that is under the skin include:  A firm lump on the body.  Pain and tenderness in the area.  Bruising. Blue, dark blue, purple-red, or yellowish skin (discoloration) may appear at the site of the hematoma if the hematoma is close to the surface of the skin. Common symptoms of a hematoma that is deep in the tissues or body spaces may be less obvious. They include:  A collection of blood in the stomach (intra-abdominal hematoma). This may cause pain in the abdomen, weakness, fainting, and shortness of breath.  A collection of blood in the head (intracranial hematoma). This may cause a headache or symptoms such as weakness, trouble speaking or understanding, or a change in consciousness. How is this diagnosed? This condition is diagnosed based on:  Your medical history.  A physical exam.  Imaging tests, such as an ultrasound or CT scan. These may  be needed if your health care provider suspects a hematoma in deeper tissues or body spaces.  Blood tests. These may be needed if your health care provider believes that the hematoma is caused by a medical condition. How is this treated? Treatment for this condition depends on the cause, size, and location of the hematoma. Treatment may include:  Doing nothing. The majority of hematomas do not need treatment as many of them go away on their own over time.  Surgery or close monitoring. This may be needed for large hematomas or hematomas that affect vital organs.  Medicines. Medicines may be given if there is an underlying medical cause for the hematoma. Follow these instructions at home: Managing pain, stiffness, and swelling   If directed, put ice on the affected area. ? Put ice in a plastic bag. ? Place a towel between your skin and the bag. ? Leave the ice on for 20 minutes, 2-3 times a day for the first couple of days.  If directed, apply heat to the affected area after applying ice for a couple of days. Use the heat source that your health care provider recommends, such as a moist heat pack or a heating pad. ? Place a towel between your skin and the heat source. ? Leave the heat on for 20-30 minutes. ? Remove the heat if your skin turns bright red. This is especially important if you are unable to feel pain, heat, or cold. You may have a greater   risk of getting burned.  Raise (elevate) the affected area above the level of your heart while you are sitting or lying down.  If told, wrap the affected area with an elastic bandage. The bandage applies pressure (compression) to the area, which may help to reduce swelling and promote healing. Do not wrap the bandage too tightly around the affected area.  If your hematoma is on a leg or foot (lower extremity) and is painful, your health care provider may recommend crutches. Use them as told by your health care provider. General instructions   Take over-the-counter and prescription medicines only as told by your health care provider.  Keep all follow-up visits as told by your health care provider. This is important. Contact a health care provider if:  You have a fever.  The swelling or discoloration gets worse.  You develop more hematomas. Get help right away if:  Your pain is worse or your pain is not controlled with medicine.  Your skin over the hematoma breaks or starts bleeding.  Your hematoma is in your chest or abdomen and you have weakness, shortness of breath, or a change in consciousness.  You have a hematoma on your scalp that is caused by a fall or injury, and you also have: ? A headache that gets worse. ? Trouble speaking or understanding speech. ? Weakness. ? Change in alertness or consciousness. Summary  A hematoma is a collection of blood under the skin, in an organ, in a body space, in a joint space, or in other tissue.  This condition usually does not need treatment because many hematomas go away on their own over time.  Large hematomas, or those that may affect vital organs, may need surgical drainage or monitoring. If the hematoma is caused by a medical condition, medicines may be prescribed.  Get help right away if your hematoma breaks or starts to bleed, you have shortness of breath, or you have a headache or trouble speaking after a fall. This information is not intended to replace advice given to you by your health care provider. Make sure you discuss any questions you have with your health care provider. Document Released: 09/09/2003 Document Revised: 06/30/2017 Document Reviewed: 06/30/2017 Elsevier Patient Education  2020 Elsevier Inc.  

## 2018-09-26 NOTE — Progress Notes (Signed)
Patient ID: LAURYN LIZARDI, female    DOB: [birth year]  Age: 68 y.o. MRN: [id number]    Subjective:  Subjective  HPI [name] presents for pain L low leg and calf x 1 week.  A branch fell and bounced up and hit her L low leg.  She has had pain since a and a knot there.  Her whole leg swelled at one point and she has pain in her L calf     No fevers, no sob, no cp  Review of Systems  Constitutional: Negative for appetite change, diaphoresis, fatigue and unexpected weight change.  Eyes: Negative for pain, redness and visual disturbance.  Respiratory: Negative for cough, chest tightness, shortness of breath and wheezing.   Cardiovascular: Negative for chest pain, palpitations and leg swelling.  Endocrine: Negative for cold intolerance, heat intolerance, polydipsia, polyphagia and polyuria.  Genitourinary: Negative for difficulty urinating, dysuria and frequency.  Musculoskeletal: Positive for arthralgias and joint swelling.  Neurological: Negative for dizziness, light-headedness, numbness and headaches.    History Past Medical History:  Diagnosis Date  . Asthma   . Cerebral aneurysm   . Complication of anesthesia HYPOTENSION INTRAOP W/ HYSTERECTOMY IN 1987--  DID OK W/ TOTAL KNEE IN 2008  . Diabetes mellitus   . DJD (degenerative joint disease)   . Dyslipidemia   . GERD (gastroesophageal reflux disease)   . H/O hiatal hernia   . History of CHF (congestive heart failure) 2010-  RESOLVED  . History of peptic ulcer YRS AGO  . Hypertension   . Meniere's disease   . Mitral valve prolapse occasional palpitations w/ chest discomfort  . OA (osteoarthritis)   . Rotator cuff tear, left   . Stroke [facility])     She has a past surgical history that includes Total knee arthroplasty (09-12-2006  [facility]); Vaginal hysterectomy (1987); Bunionectomy (2007); Tympanoplasty (1990;   1980); LEFT SHOULDER SURGERY (1997); Tubal ligation (1976); Shoulder arthroscopy (09/20/2011); and Total knee  arthroplasty (Left, 11/30/2017).   Her family history includes Aneurysm in her mother; Bipolar disorder in her son; Breast cancer in her maternal aunt, maternal aunt, maternal aunt, maternal grandmother, and mother; Congestive Heart Failure in her father; Diabetes in her father and mother; Heart attack in her sister; Heart disease in her mother and sister; Hypertension in her father and sister; Kidney disease in her mother; Lymphoma in her father; Other in her brother and son; Ovarian cancer in her mother; Stroke in her sister; Thyroid disease in her brother, mother, paternal grandmother, and sister.She reports that she has never smoked. She has never used smokeless tobacco. She reports that she does not drink alcohol or use drugs.  Current Outpatient Medications on File Prior to Visit  Medication Sig Dispense Refill  . albuterol (PROAIR HFA) 108 (90 Base) MCG/ACT inhaler Inhale 1-2 puffs into the lungs every 6 (six) hours as needed for wheezing or shortness of breath. 18 g 6  . albuterol (PROVENTIL) (2.5 MG/3ML) 0.083% nebulizer solution Take 3 mLs (2.5 mg total) by nebulization every 6 (six) hours as needed for wheezing or shortness of breath. 150 mL 1  . aspirin (BAYER ASPIRIN) 325 MG tablet Take 1 tablet (325 mg total) by mouth daily. 30 tablet   . Bioflavonoid Products (VITAMIN C) CHEW Chew 2 each by mouth daily.    . blood glucose meter kit and supplies KIT Dispense based on patient and insurance preference. Use up to four times daily as directed. (FOR ICD-9 250.00, 250.01). Pt uses once  a day 1 each 0  . cetirizine (ZYRTEC) 10 MG tablet Take 1 tablet (10 mg total) by mouth daily. 90 tablet 3  . clopidogrel (PLAVIX) 75 MG tablet TAKE 1 TABLET (75 MG TOTAL) BY MOUTH DAILY. 90 tablet 1  . docusate sodium (COLACE) 100 MG capsule Take 1 capsule (100 mg total) by mouth daily. 10 capsule 2  . empagliflozin (JARDIANCE) 10 MG TABS tablet TAKE 10 mg TABLET BY MOUTH  DAILY 90 tablet 1  . famotidine  (PEPCID) 10 MG tablet Take 10 mg by mouth daily.    . fluticasone (FLOVENT HFA) 220 MCG/ACT inhaler Inhale 1 puff into the lungs 2 (two) times daily. May increase to 2 puffs if needed (Patient taking differently: Inhale 2 puffs into the lungs 2 (two) times daily as needed (for shorness of breath or wheezing). ) 1 Inhaler 12  . furosemide (LASIX) 20 MG tablet Take 1 tablet (20 mg total) by mouth daily. 90 tablet 1  . gabapentin (NEURONTIN) 300 MG capsule TAKE 2 CAPSULES BY MOUTH TWO TIMES DAILY 360 capsule 1  . glucose blood (ACCU-CHEK AVIVA PLUS) test strip Use as instructed 100 each 12  . HYDROcodone-acetaminophen (NORCO/VICODIN) 5-325 MG tablet Norco 5-325 1 PO TID PRN FOR PAIN 30 tablet 0  . ipratropium (ATROVENT) 0.03 % nasal spray Place 2 sprays into the nose 4 (four) times daily. 60 mL 1  . Lancets Misc. (ACCU-CHEK SOFTCLIX LANCET DEV) KIT USE AS DIRECTED 1 kit 0  . linagliptin (TRADJENTA) 5 MG TABS tablet Take 0.5 tablets (2.5 mg total) by mouth daily. 45 tablet 1  . lisinopril (PRINIVIL,ZESTRIL) 20 MG tablet Take 1 tablet (20 mg total) by mouth daily. 90 tablet 1  . metFORMIN (GLUCOPHAGE) 500 MG tablet Take 2 tablets (1,000 mg total) by mouth 2 (two) times daily. 360 tablet 1  . metoprolol tartrate (LOPRESSOR) 25 MG tablet Take 1 tablet (25 mg total) by mouth 2 (two) times daily. 180 tablet 1  . Multiple Vitamins-Minerals (MULTIVITAMIN PO) Take 2 tablets by mouth daily.    Marland Kitchen oxyCODONE-acetaminophen (PERCOCET) 5-325 MG tablet Take 1 tablet by mouth every 4 (four) hours as needed for severe pain. 40 tablet 0  . QUEtiapine (SEROQUEL) 100 MG tablet TAKE 1/2 TABLET (50 MG TOTAL) BY MOUTH AT BEDTIME. 45 tablet 1  . sertraline (ZOLOFT) 50 MG tablet Take 0.5 tablets (25 mg total) by mouth at bedtime. 45 tablet 1   No current facility-administered medications on file prior to visit.      Objective:  Objective  Physical Exam Vitals signs and nursing note reviewed.  Constitutional:       Appearance: She is well-developed.  HENT:     Head: Normocephalic and atraumatic.  Eyes:     Conjunctiva/sclera: Conjunctivae normal.  Neck:     Musculoskeletal: Normal range of motion and neck supple.     Thyroid: No thyromegaly.     Vascular: No carotid bruit or JVD.  Cardiovascular:     Rate and Rhythm: Normal rate and regular rhythm.     Heart sounds: Normal heart sounds. No murmur.  Pulmonary:     Effort: Pulmonary effort is normal. No respiratory distress.     Breath sounds: Normal breath sounds. No wheezing or rales.  Chest:     Chest wall: No tenderness.  Musculoskeletal:     Left knee: She exhibits swelling. Tenderness found.     Left lower leg: She exhibits tenderness. She exhibits no swelling.  Legs:     Left foot: Tenderness present.       Feet:  Neurological:     Mental Status: She is alert and oriented to person, place, and time.    BP 118/80 (BP Location: Right Arm, Patient Position: Sitting, Cuff Size: Normal)   Pulse 64   Temp (!) 96.2 F (35.7 C) (Temporal)   Resp 18   Ht 5' 1"  (1.549 m)   Wt 181 lb 6.4 oz (82.3 kg)   SpO2 98%   BMI 34.28 kg/m  Wt Readings from Last 3 Encounters:  09/26/18 181 lb 6.4 oz (82.3 kg)  04/10/18 177 lb (80.3 kg)  02/15/18 174 lb (78.9 kg)     Lab Results  Component Value Date   WBC 6.5 02/15/2018   HGB 14.5 02/15/2018   HCT 44.9 02/15/2018   PLT 298.0 02/15/2018   GLUCOSE 97 02/15/2018   CHOL 147 02/15/2018   TRIG 116.0 02/15/2018   HDL 65.50 02/15/2018   LDLCALC 58 02/15/2018   ALT 9 02/15/2018   AST 12 02/15/2018   NA 142 02/15/2018   K 4.7 02/15/2018   CL 105 02/15/2018   CREATININE 0.76 02/15/2018   BUN 23 02/15/2018   CO2 29 02/15/2018   TSH 0.68 05/19/2016   INR 0.98 11/21/2017   HGBA1C 6.1 02/15/2018    No results found.   Assessment & Plan:  Plan  I am having Orvella M. Traber maintain her docusate sodium, cetirizine, albuterol, famotidine, fluticasone, albuterol, Accu-Chek Softclix  Lancet Dev, Vitamin C, Multiple Vitamins-Minerals (MULTIVITAMIN PO), oxyCODONE-acetaminophen, aspirin, HYDROcodone-acetaminophen, glucose blood, metoprolol tartrate, lisinopril, linagliptin, empagliflozin, ipratropium, blood glucose meter kit and supplies, metFORMIN, sertraline, furosemide, clopidogrel, QUEtiapine, gabapentin, and methocarbamol.  Meds ordered this encounter  Medications  . methocarbamol (ROBAXIN) 500 MG tablet    Sig: Take 1 tablet (500 mg total) by mouth 3 (three) times daily.    Dispense:  40 tablet    Refill:  0    Problem List Items Addressed This Visit      Unprioritized   Traumatic hematoma of left lower leg    Other Visit Diagnoses    Pain of left calf    -  Primary   Relevant Medications   methocarbamol (ROBAXIN) 500 MG tablet   Other Relevant Orders   VAS [place] LOWER EXTREMITY VENOUS (DVT)      Follow-up: Return if symptoms worsen or fail to improve.  Ann Held, DO

## 2018-09-27 ENCOUNTER — Ambulatory Visit (HOSPITAL_COMMUNITY)
Admission: RE | Admit: 2018-09-27 | Discharge: 2018-09-27 | Disposition: A | Discharge status: Home / Self Care | Payer: Medicare Other | Source: Ambulatory Visit | Attending: Family Medicine | Admitting: Family Medicine

## 2018-09-27 ENCOUNTER — Telehealth: Payer: Self-pay | Admitting: *Deleted

## 2018-09-27 ENCOUNTER — Encounter: Payer: Self-pay | Admitting: Family Medicine

## 2018-09-27 DIAGNOSIS — M79662 Pain in left lower leg: Secondary | ICD-10-CM

## 2018-09-27 NOTE — Telephone Encounter (Signed)
Greg from vascular about doppler.  Negative DVT.

## 2018-09-27 NOTE — Progress Notes (Signed)
Left lower extremity venous duplex has been completed. Preliminary results can be found in CV Proc through chart review.  Results were given to [facility] at Dr. Etter Sjogren Chase's office.    09/27/18 10:23 AM Carlos Levering RVT

## 2018-10-03 ENCOUNTER — Other Ambulatory Visit: Payer: Self-pay | Admitting: Family Medicine

## 2018-10-03 DIAGNOSIS — F320 Major depressive disorder, single episode, mild: Secondary | ICD-10-CM

## 2018-10-03 MED FILL — SERTRALINE HCL 50 MG TABS: 50 | 90 days supply | Qty: 45 | Fill #0

## 2018-10-17 MED FILL — SERTRALINE HCL 50 MG TABS: 50 | 90 days supply | Qty: 45 | Fill #0

## 2018-10-27 IMAGING — CT CT HEAD W/O CM
3 of 7 series · 15 of 47 positions shown, 18 images · non-contrast
Comparison: Head CT July 07, 2015

CLINICAL DATA: Pain following fall

EXAM:
CT HEAD WITHOUT CONTRAST
CT CERVICAL SPINE WITHOUT CONTRAST
TECHNIQUE: Multidetector CT imaging of the head and cervical spine was
performed following the standard protocol without intravenous
contrast. Multiplanar CT image reconstructions of the cervical spine
were also generated.

[Series 302: soft tissue, idose (2) · axial · 0.37mm/px · z∈[+142,+282]mm · 10 of 84 slices shown, 13 images]
[im 7/84  brain]
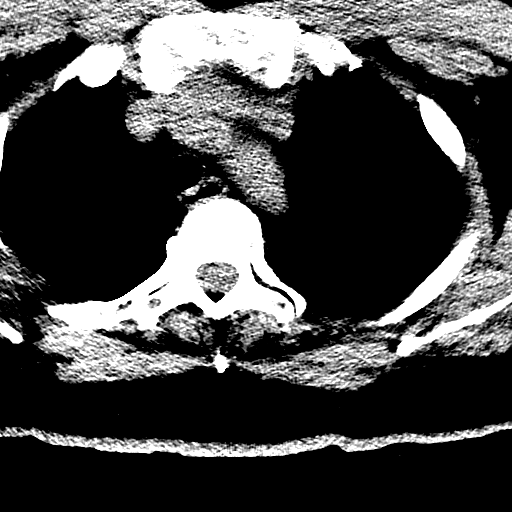
[im 7/84  bone]
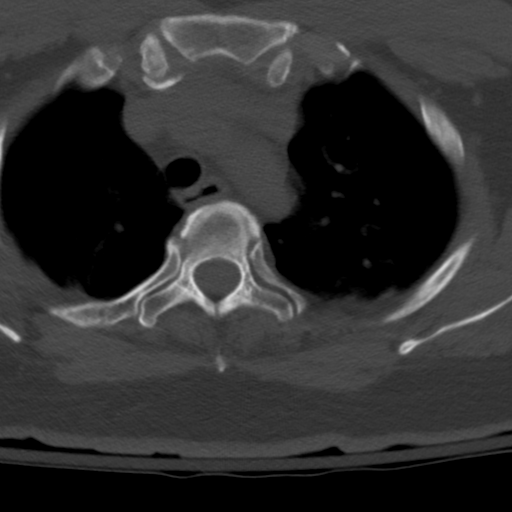
[im 13/84  brain]
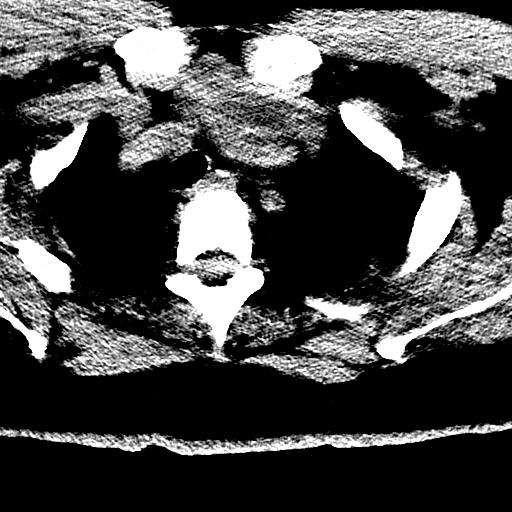
[im 26/84  brain]
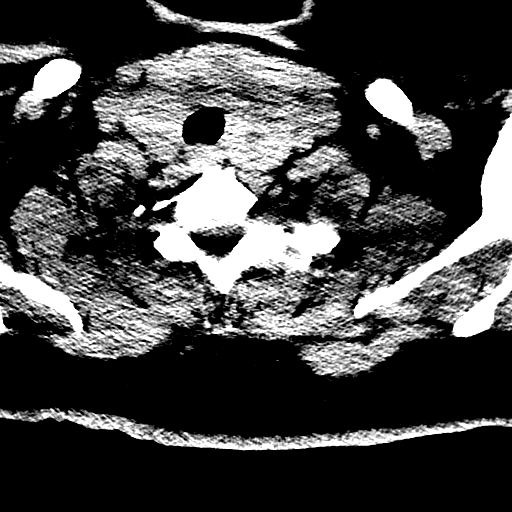
[im 32/84  brain]
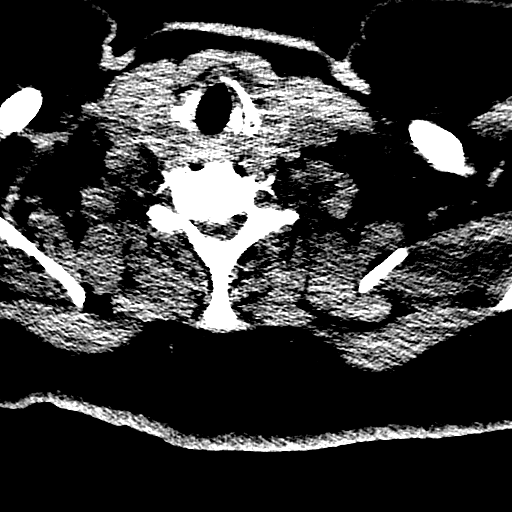
[im 39/84  brain]
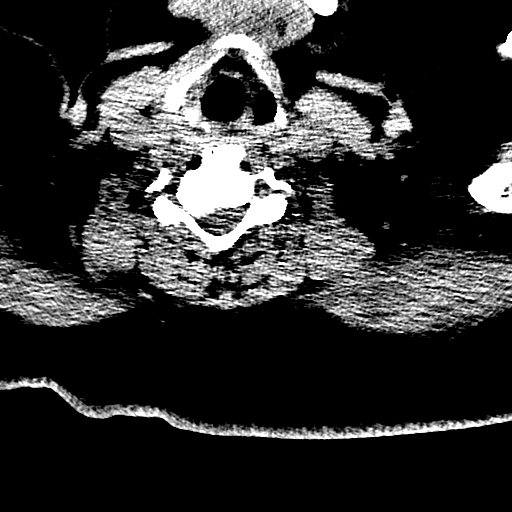
[im 39/84  bone]
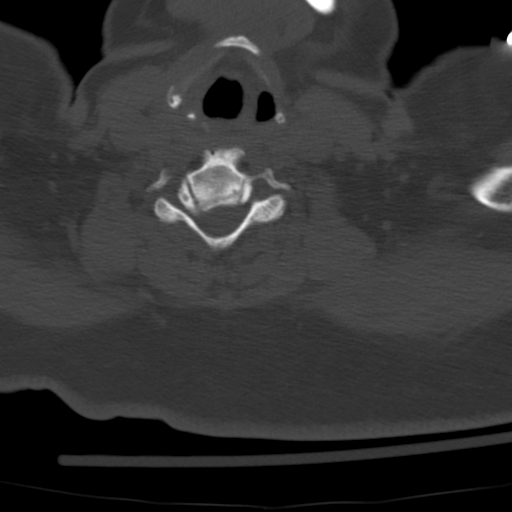
[im 45/84  brain]
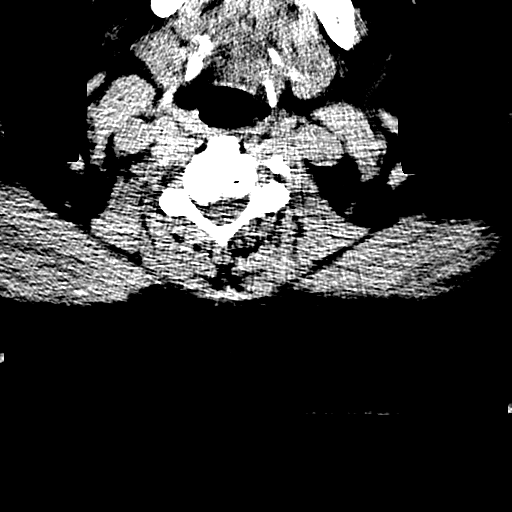
[im 52/84  brain]
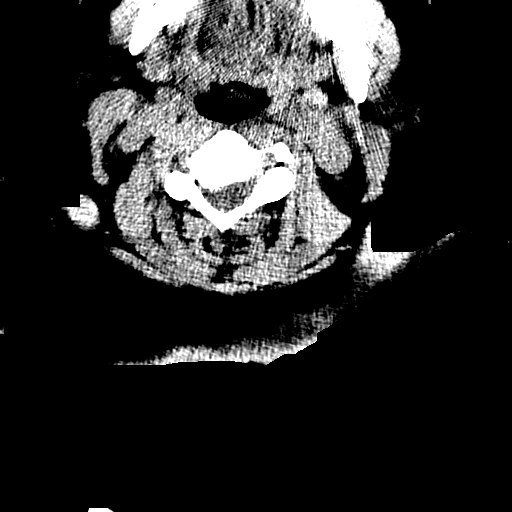
[im 64/84  brain]
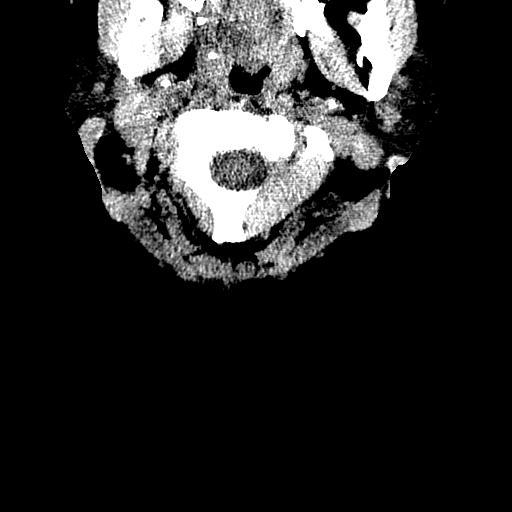
[im 71/84  brain]
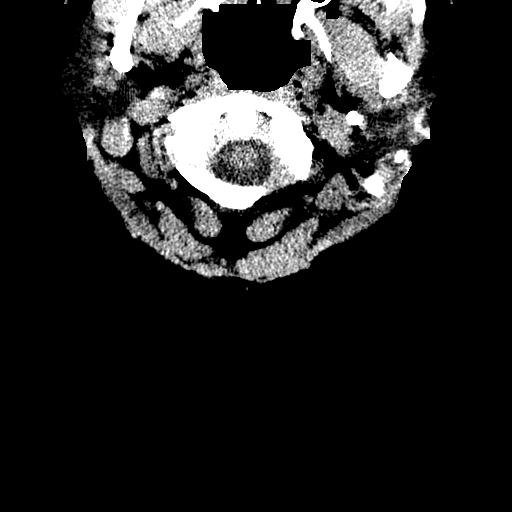
[im 71/84  bone]
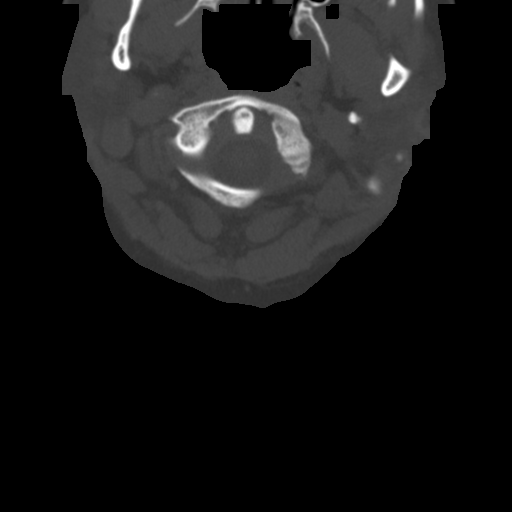
[im 77/84  brain]
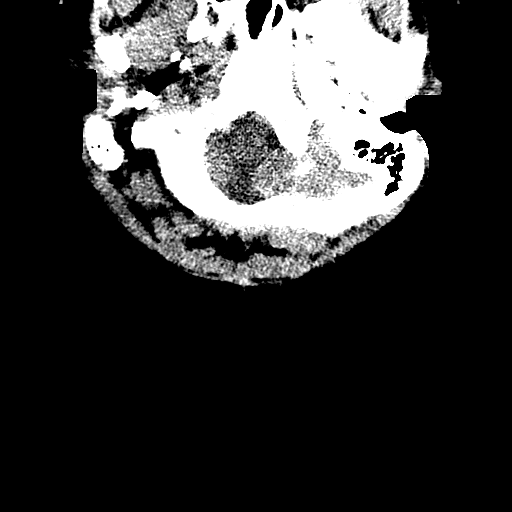

[Series 304: coronal, idose (2) · coronal · 0.34mm/px · 3 of 94 slices shown]
[im 19/94  brain]
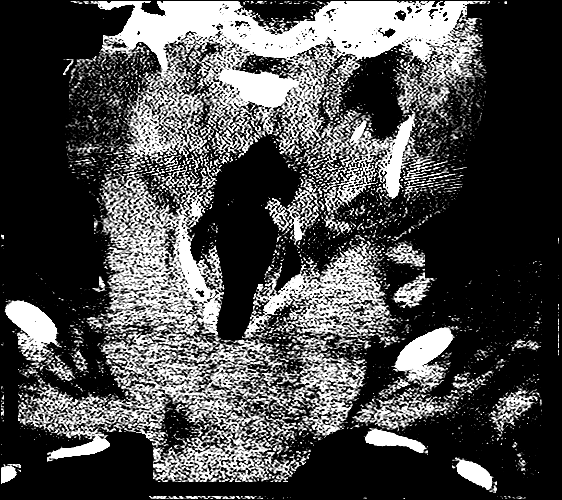
[im 38/94  brain]
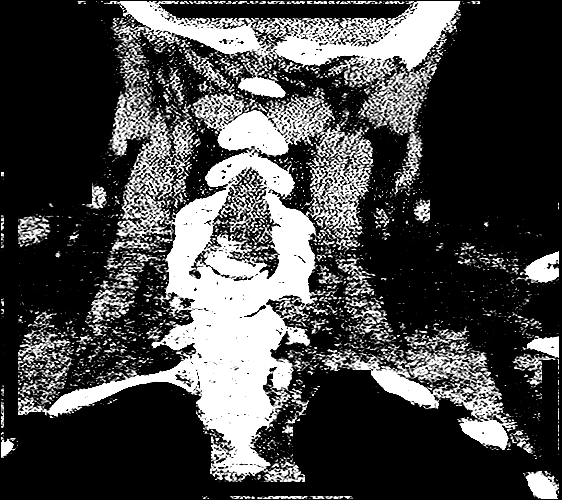
[im 56/94  brain]
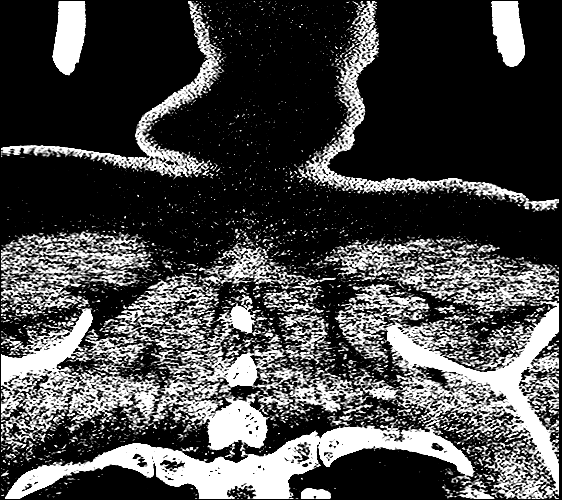

[Series 305: sagittal, idose (2) · sagittal · 0.34mm/px · 2 of 94 slices shown]
[im 32/94  brain]
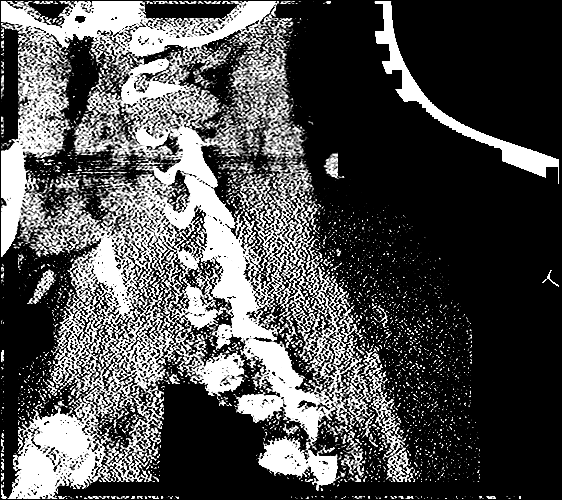
[im 63/94  brain]
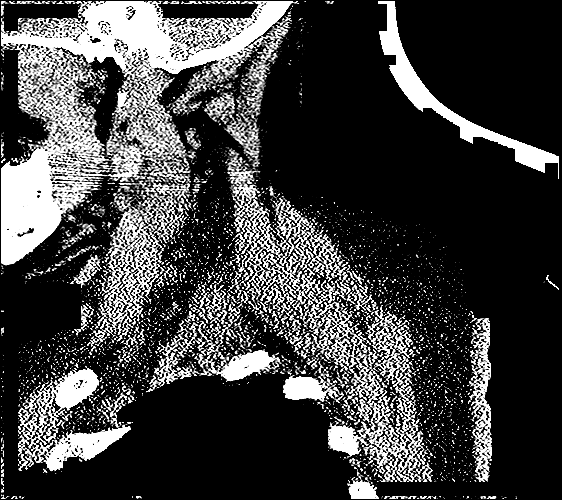

[15 of 47 positions shown; findings below may reference images not displayed]

FINDINGS: CT HEAD FINDINGS

Brain: The ventricles are normal in size and configuration. There is
no intracranial mass, hemorrhage, extra-axial fluid collection, or
midline shift. Gray-white compartments appear normal. No evident
acute infarct.

Vascular: No hyperdense vessel. There is calcification in each
carotid siphon region.

Skull: There is frontal hyperostosis bilaterally. Bony calvarium
appears intact.

Sinuses/Orbits: There is a retention cyst in the inferior right
maxillary antrum. There is slight mucosal thickening in several
ethmoid air cells bilaterally. Other visualized paranasal sinuses
are clear. Orbits appear symmetric bilaterally.

Other: Mastoid air cells are clear.

CT CERVICAL SPINE FINDINGS

Alignment: There is mid cervical dextroscoliosis. No
spondylolisthesis.

Skull base and vertebrae: The craniocervical junction and skull base
regions are normal. There is no demonstrable fracture. There are no
blastic or lytic bone lesions.

Soft tissues and spinal canal: Prevertebral soft tissues and
predental space regions are normal. There is no paraspinous lesion.
There is no cord or canal hematoma. No spinal stenosis.

Disc levels: There is moderately severe disc space narrowing at
C6-7. There is somewhat milder disc space narrowing at all other
levels. There is facet hypertrophy at multiple levels. There is exit
foraminal narrowing with impression on the exiting nerve roots due
to bony hypertrophy at C3-4 on the right, at C4-5 on the right, at
C5-6 on the right, and at C6-7 on the right. No frank disc
extrusion.

Upper chest: Visualized lung regions are clear.

Other: There is a dominant mass occupying the isthmus and much of
the left lobe of the thyroid measuring 5.0 x 2.2 cm.
IMPRESSION: CT head: No intracranial mass hemorrhage, or extra-axial fluid
collection. No acute infarct evident. Foci of arterial vascular
calcification noted. Areas of mild paranasal sinus disease noted.

CT cervical spine: No fracture or spondylolisthesis. Multilevel
arthropathy. **An incidental finding of potential clinical
significance has been found. Dominant mass arising in the isthmus
and left lobe of the thyroid. Consider further evaluation with
thyroid ultrasound. If patient is clinically hyperthyroid, consider
nuclear medicine thyroid uptake and scan. **

## 2018-11-20 ENCOUNTER — Telehealth: Payer: Self-pay | Admitting: Family Medicine

## 2018-11-20 DIAGNOSIS — I10 Essential (primary) hypertension: Secondary | ICD-10-CM

## 2018-11-20 MED ORDER — FUROSEMIDE 20 MG PO TABS: 20.0000 mg | ORAL_TABLET | Freq: Every day | ORAL | 0 refills | Status: DC

## 2018-11-20 MED FILL — FUROSEMIDE 20 MG TAB: 20 | 90 days supply | Qty: 90 | Fill #0

## 2018-11-20 NOTE — Telephone Encounter (Signed)
Patient's daughter, Bess Vanwagenen, calling and states that the patient is trying to get furosemide (LASIX) 20 MG tablet From the pharmacy and they are telling her they only have a 30 day supply prescription on file. Would like to know if a new 90 day supply prescription could be sent to the pharmacy? Please advise.  [facility] [place]

## 2018-11-20 NOTE — Telephone Encounter (Signed)
Medication refilled as 90 day supply.

## 2019-01-01 ENCOUNTER — Other Ambulatory Visit: Payer: Self-pay | Admitting: Family Medicine

## 2019-01-01 DIAGNOSIS — R0982 Postnasal drip: Secondary | ICD-10-CM

## 2019-01-01 DIAGNOSIS — I10 Essential (primary) hypertension: Secondary | ICD-10-CM

## 2019-01-01 MED FILL — QUETIAPINE FUMARATE 100 MG: 100 | 90 days supply | Qty: 45 | Fill #1

## 2019-01-01 MED FILL — CLOPIDOGREL 75 MG TABLET: 75 | 90 days supply | Qty: 90 | Fill #1

## 2019-01-01 MED FILL — SERTRALINE HCL 50 MG TABLET: 50 | 90 days supply | Qty: 45 | Fill #1

## 2019-01-01 MED FILL — GABAPENTIN 300 MG CAPSULE: 300 | 90 days supply | Qty: 360 | Fill #1

## 2019-01-01 MED FILL — TRADJENTA 5 MG TABLET: 5 | 90 days supply | Qty: 45 | Fill #1

## 2019-01-02 MED FILL — IPRATROPIUM 0.03% SPRAY: 0.03 | 86 days supply | Qty: 60 | Fill #0

## 2019-01-02 MED FILL — metFORMIN HCL 500 MG TABS: 500 | 90 days supply | Qty: 360 | Fill #0

## 2019-01-02 MED FILL — METOPROLOL TARTRATE 25 MG T: 25 | 90 days supply | Qty: 180 | Fill #0

## 2019-01-02 MED FILL — LISINOPRIL 20 MG TABLET: 20 | 90 days supply | Qty: 90 | Fill #0

## 2019-02-09 ENCOUNTER — Other Ambulatory Visit: Payer: Self-pay | Admitting: Family Medicine

## 2019-02-09 DIAGNOSIS — I10 Essential (primary) hypertension: Secondary | ICD-10-CM

## 2019-02-12 MED ORDER — METOPROLOL TARTRATE 25 MG PO TABS: 25.0000 mg | ORAL_TABLET | Freq: Two times a day (BID) | ORAL | 0 refills | Status: DC

## 2019-02-12 MED ORDER — LISINOPRIL 20 MG PO TABS: 20.0000 mg | ORAL_TABLET | Freq: Every day | ORAL | 0 refills | Status: DC

## 2019-03-02 MED FILL — FUROSEMIDE 20 MG TAB: 20 | 90 days supply | Qty: 90 | Fill #0

## 2019-03-07 ENCOUNTER — Other Ambulatory Visit (HOSPITAL_COMMUNITY): Payer: Self-pay | Admitting: Interventional Radiology

## 2019-03-07 ENCOUNTER — Telehealth (HOSPITAL_COMMUNITY): Payer: Self-pay

## 2019-03-07 DIAGNOSIS — I671 Cerebral aneurysm, nonruptured: Secondary | ICD-10-CM

## 2019-03-07 NOTE — Telephone Encounter (Signed)
-----   Message from Ronney Lion, [place] sent at 03/07/2019  2:15 PM EST ----- Lia Foyer,  I discussed with Dr. Estanislado Pandy. He would like to see patient in consult to discuss management of aneurysm, first available. Please schedule and please let me know if you have any questions.  Thanks! Ally ----- Message ----- From: Danielle Dess Sent: 03/07/2019  11:00 AM EST To: Edmon Crape,   Dr. Idolina Primer from [facility] just called regarding Ms. Detter. I don't think she has been seen since 2016. She has a known right cavernous carotid aneurysm. She has been complaining of right sided headache for the past 3 weeks and worse over the past 3 days. She was due for a f/u mri back in 2018 but never came. Should I get her in for a consult or imaging. I did tell the doctor, if it is very severe then she should go to the ER. He said from her description, they are not that severe. Please advise.   Thanks,  Lia Foyer

## 2019-03-14 ENCOUNTER — Ambulatory Visit (HOSPITAL_COMMUNITY)
Admission: RE | Admit: 2019-03-14 | Discharge: 2019-03-14 | Disposition: A | Discharge status: Home / Self Care | Payer: Medicare Other | Source: Ambulatory Visit | Attending: Interventional Radiology | Admitting: Interventional Radiology

## 2019-03-14 ENCOUNTER — Telehealth: Payer: Self-pay | Admitting: Student

## 2019-03-14 ENCOUNTER — Other Ambulatory Visit: Payer: Self-pay

## 2019-03-14 DIAGNOSIS — I671 Cerebral aneurysm, nonruptured: Secondary | ICD-10-CM

## 2019-03-14 DIAGNOSIS — H40013 Open angle with borderline findings, low risk, bilateral: Secondary | ICD-10-CM | POA: Diagnosis not present

## 2019-03-14 NOTE — Progress Notes (Signed)
ERROR

## 2019-03-14 NOTE — Consult Note (Signed)
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  Referring Physician(s): Melancon, York Ram  Supervising Physician: Luanne Bras  Patient Status: [facility] - Out-pt  History of Present Illness: Sherry [name] is a 69 y.o. female with a past medical history as below, with pertinent past medical history including hypertension, dyslipidemia, MVP, HF, CVA, TIA 11/2014, asthma, GERD, hiatal hernia, meniere's disease, and diabetes mellitus. She is known to [facility] and has been followed by Dr. Estanislado Pandy since 11/2015. She first presented to our department at the request of Dr. Minda Ditto for management of right MCA stenosis and right ICA aneurysm seen on CTA during TIA work-up 11/2014. She underwent an image-guided diagnostic cerebral arteriogram 11/15/2014 by Dr. Estanislado Pandy which revealed right MCA stenosis and a right ICA aneurysm. She then consulted with Dr. Rob Hickman on an outpatient basis 12/09/2014 to discuss management options. At that time, patient decided to pursue conservative management, including DAPT (Plavix and Aspirin), along with routine imaging scans to monitor for changes. She was due for follow-up MRI in 2018, however was lost to follow-up. Over the past few weeks, patient has developed progressive headaches and went to her Optometrist who recommended patient see Dr. Estanislado Pandy regarding stenosis/aneurysm management.  Diagnostic cerebral arteriogram 11/15/2014: 1. Approximately 75% stenosis of the right middle cerebral artery M1 segment with mild post stenotic dilatation. 2. Approximately 7 x 4.1 mm saccular wide neck aneurysm arising from the caval cavernous segment of the right internal carotid artery.  MRI/MRA brain/head 04/15/2015: 1. Normal brain MRI. No acute intracranial infarct or other abnormality identified. 2. Stable intracranial MRA. No large or proximal arterial branch occlusion. 3. Mild branch vessel  atherosclerotic changes without proximal severe or correctable stenosis. 4. 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 Patient awake and alert sitting in chair. Accompanied by daughter. Complains of intermittent "squeezing" headaches for the past few weeks. Complains of "episodes where I cant think" for the past few weeks. Complains of episodes of intermittent dizziness with associated blurred vision for the past few weeks. Denies weakness, numbness/tingling, hearing changes, tinnitus, or speech difficulty.  Currently taking Plavix 75 mg once daily and Aspirin 325 mg once daily.   Past Medical History:  Diagnosis Date  . Asthma   . Cerebral aneurysm   . Complication of anesthesia HYPOTENSION INTRAOP W/ HYSTERECTOMY IN 1987--  DID OK W/ TOTAL KNEE IN 2008  . Diabetes mellitus   . DJD (degenerative joint disease)   . Dyslipidemia   . GERD (gastroesophageal reflux disease)   . H/O hiatal hernia   . History of CHF (congestive heart failure) 2010-  RESOLVED  . History of peptic ulcer YRS AGO  . Hypertension   . Meniere's disease   . Mitral valve prolapse occasional palpitations w/ chest discomfort  . OA (osteoarthritis)   . Rotator cuff tear, left   . Stroke [facility])     Past Surgical History:  Procedure Laterality Date  . BUNIONECTOMY  2007   LEFT FOOT  . LEFT SHOULDER SURGERY  1997   ROTATOR CUFF REPAIR  . SHOULDER ARTHROSCOPY  09/20/2011   Procedure: ARTHROSCOPY SHOULDER;  Surgeon: Johnn Hai, MD;  Location: [facility];  Service: Orthopedics;  Laterality: Left;  SAD AND MINI OPEN ROTATOR CUFF REPAIR    . TOTAL KNEE ARTHROPLASTY  09-12-2006  [facility]  RIGHT KNEE  . TOTAL KNEE ARTHROPLASTY Left 11/30/2017   Procedure: LEFT TOTAL KNEE REPLACEMENT;  Surgeon: [name], MD;  Location: [place];  Service: Orthopedics;  Laterality:  Left;  . TUBAL LIGATION  1976  . TYMPANOPLASTY  1990;   1980  . VAGINAL HYSTERECTOMY  1987    Allergies: Influenza vaccines, Demerol, Nsaids, Prednisone, Tramadol, Codeine, and Mango flavor [flavoring agent]  Medications: Prior to Admission medications   Medication Sig Start Date End Date Taking? Authorizing Provider  albuterol (PROAIR HFA) 108 (90 Base) MCG/ACT inhaler Inhale 1-2 puffs into the lungs every 6 (six) hours as needed for wheezing or shortness of breath. 12/07/16   Copland, Gay Filler, MD  albuterol (PROVENTIL) (2.5 MG/3ML) 0.083% nebulizer solution Take 3 mLs (2.5 mg total) by nebulization every 6 (six) hours as needed for wheezing or shortness of breath. 07/16/16   Copland, Gay Filler, MD  aspirin (BAYER ASPIRIN) 325 MG tablet Take 1 tablet (325 mg total) by mouth daily. 12/02/17   [name], MD  Bioflavonoid Products (VITAMIN C) CHEW Chew 2 each by mouth daily.    [provider]  blood glucose meter kit and supplies KIT Dispense based on patient and insurance preference. Use up to four times daily as directed. (FOR ICD-9 250.00, 250.01). Pt uses once a day 03/23/18   Copland, Gay Filler, MD  cetirizine (ZYRTEC) 10 MG tablet Take 1 tablet (10 mg total) by mouth daily. 07/16/16   Copland, Gay Filler, MD  clopidogrel (PLAVIX) 75 MG tablet TAKE 1 TABLET (75 MG TOTAL) BY MOUTH DAILY. 09/25/18   Copland, Gay Filler, MD  docusate sodium (COLACE) 100 MG capsule Take 1 capsule (100 mg total) by mouth daily. 07/16/16   Copland, Gay Filler, MD  empagliflozin (JARDIANCE) 10 MG TABS tablet TAKE 10 mg TABLET BY MOUTH  DAILY 03/22/18   Copland, Gay Filler, MD  famotidine (PEPCID) 10 MG tablet Take 10 mg by mouth daily.    [provider]  fluticasone (FLOVENT HFA) 220 MCG/ACT inhaler Inhale 1 puff into the lungs 2 (two) times daily. May increase to 2 puffs if needed Patient taking differently: Inhale 2 puffs into the lungs 2 (two) times daily as needed (for shorness of breath or  wheezing).  08/25/16   Copland, Gay Filler, MD  furosemide (LASIX) 20 MG tablet Take 1 tablet (20 mg total) by mouth daily. 11/20/18   Copland, Gay Filler, MD  gabapentin (NEURONTIN) 300 MG capsule TAKE 2 CAPSULES BY MOUTH TWO TIMES DAILY 09/25/18   Copland, Gay Filler, MD  glucose blood (ACCU-CHEK AVIVA PLUS) test strip Use as instructed 02/14/18   Copland, Gay Filler, MD  HYDROcodone-acetaminophen (NORCO/VICODIN) 5-325 MG tablet Norco 5-325 1 PO TID PRN FOR PAIN 12/13/17   [name], MD  ipratropium (ATROVENT) 0.03 % nasal spray PLACE 2 SPRAYS INTO THE NOSE 4 (FOUR) TIMES DAILY. 01/02/19   Copland, Gay Filler, MD  Lancets Misc. (ACCU-CHEK SOFTCLIX LANCET DEV) KIT USE AS DIRECTED 03/18/17   Renato Shin, MD  linagliptin (TRADJENTA) 5 MG TABS tablet Take 0.5 tablets (2.5 mg total) by mouth daily. 03/22/18   Copland, Gay Filler, MD  lisinopril (ZESTRIL) 20 MG tablet Take 1 tablet (20 mg total) by mouth daily. 02/12/19   Copland, Gay Filler, MD  metFORMIN (GLUCOPHAGE) 500 MG tablet TAKE 2 TABLETS (1,000 MG TOTAL) BY MOUTH 2 (TWO) TIMES DAILY. 01/02/19   Copland, Gay Filler, MD  methocarbamol (ROBAXIN) 500 MG tablet Take 1 tablet (500 mg  total) by mouth 3 (three) times daily. 09/26/18   Ann Held, DO  metoprolol tartrate (LOPRESSOR) 25 MG tablet Take 1 tablet (25 mg total) by mouth 2 (two) times daily. 02/12/19   Copland, Gay Filler, MD  Multiple Vitamins-Minerals (MULTIVITAMIN PO) Take 2 tablets by mouth daily.    [provider]  QUEtiapine (SEROQUEL) 100 MG tablet TAKE 1/2 TABLET (50 MG TOTAL) BY MOUTH AT BEDTIME. 09/25/18   Copland, Gay Filler, MD  sertraline (ZOLOFT) 50 MG tablet TAKE 1/2 TABLET (25 MG TOTAL) BY MOUTH AT BEDTIME. 10/03/18   Copland, Gay Filler, MD     Family History  Problem Relation Age of Onset  . Heart disease Mother        in her 7s, heart attack  . Diabetes Mother   . Kidney disease Mother        dialysis  . Thyroid disease Mother   . Aneurysm Mother   . Ovarian  cancer Mother   . Breast cancer Mother   . Thyroid disease Sister   . Heart disease Sister   . Lymphoma Father   . Diabetes Father   . Hypertension Father   . Congestive Heart Failure Father   . Thyroid disease Brother   . Thyroid disease Paternal Grandmother   . Heart attack Sister        at age 37  . Stroke Sister   . Hypertension Sister   . Breast cancer Maternal Aunt   . Breast cancer Maternal Grandmother   . Breast cancer Maternal Aunt   . Breast cancer Maternal Aunt   . Other Brother        Musician accident  . Bipolar disorder Son   . Other Son        GSW    Social History   Socioeconomic History  . Marital status: Widowed    Spouse name: Not on file  . Number of children: 4  . Years of education: BA  . Highest education level: Not on file  Occupational History  . Occupation: LPN - retired  Tobacco Use  . Smoking status: Never Smoker  . Smokeless tobacco: Never Used  Substance and Sexual Activity  . Alcohol use: No  . Drug use: No  . Sexual activity: Not on file  Other Topics Concern  . Not on file  Social History Narrative  . Not on file   Social Determinants of Health   Financial Resource Strain:   . Difficulty of Paying Living Expenses: Not on file  Food Insecurity:   . Worried About Charity fundraiser in the Last Year: Not on file  . Ran Out of Food in the Last Year: Not on file  Transportation Needs:   . Lack of Transportation (Medical): Not on file  . Lack of Transportation (Non-Medical): Not on file  Physical Activity:   . Days of Exercise per Week: Not on file  . Minutes of Exercise per Session: Not on file  Stress:   . Feeling of Stress : Not on file  Social Connections:   . Frequency of Communication with Friends and Family: Not on file  . Frequency of Social Gatherings with Friends and Family: Not on file  . Attends Religious Services: Not on file  . Active Member of Clubs or Organizations: Not on file  . Attends Archivist  Meetings: Not on file  . Marital Status: Not on file     Review of Systems: A 12 point  ROS discussed and pertinent positives are indicated in the HPI above.  All other systems are negative.  Review of Systems  Constitutional: Negative for chills and fever.  HENT: Negative for hearing loss and tinnitus.   Eyes: Positive for visual disturbance.  Respiratory: Negative for shortness of breath and wheezing.   Cardiovascular: Negative for chest pain and palpitations.  Neurological: Positive for dizziness and headaches. Negative for speech difficulty, weakness and numbness.  Psychiatric/Behavioral: Negative for behavioral problems and confusion.    Vital Signs: There were no vitals taken for this visit.  Physical Exam Constitutional:      General: She is not in acute distress.    Appearance: Normal appearance.  Pulmonary:     Effort: Pulmonary effort is normal. No respiratory distress.  Skin:    General: Skin is warm and dry.  Neurological:     Mental Status: She is alert and oriented to person, place, and time.     Comments: Speech and comprehension intact.  Psychiatric:        Mood and Affect: Mood normal.        Behavior: Behavior normal.      Imaging: No results found.  Labs:  CBC: No results for input(s): WBC, HGB, HCT, PLT in the last 8760 hours.  COAGS: No results for input(s): INR, APTT in the last 8760 hours.  BMP: No results for input(s): NA, K, CL, CO2, GLUCOSE, BUN, CALCIUM, CREATININE, GFRNONAA, GFRAA in the last 8760 hours.  Invalid input(s): CMP  LIVER FUNCTION TESTS: No results for input(s): BILITOT, AST, ALT, ALKPHOS, PROT, ALBUMIN in the last 8760 hours.  TUMOR MARKERS: No results for input(s): AFPTM, CEA, CA199, CHROMGRNA in the last 8760 hours.  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 Dr. Estanislado Pandy was present for consultation. Discussed findings of recent diagnostic cerebral arteriogram  (11/15/2014) and MRI/MRA (04/15/2015). Explained the best course of management is with an image-guided diagnostic cerebral arteriogram to evaluate aneurysm/stenosis and see if they warrant treatment. Plan for follow-up with an image-guided diagnostic cerebral arteriogram, first available. Informed patient that our schedulers will call her to set up this procedure. Instructed patient to continue taking Plavix 75 mg once daily and Aspirin 325 mg once daily.  All questions answered and concerns addressed. Patient conveys understanding and agrees with plan.  Thank you for this interesting consult.  I greatly enjoyed meeting KEELEY SUSSMAN and look forward to participating in their care.  A copy of this report was sent to the requesting provider on this date.  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

## 2019-03-15 ENCOUNTER — Other Ambulatory Visit (HOSPITAL_COMMUNITY): Payer: Self-pay | Admitting: Interventional Radiology

## 2019-03-15 DIAGNOSIS — I771 Stricture of artery: Secondary | ICD-10-CM

## 2019-03-15 DIAGNOSIS — I671 Cerebral aneurysm, nonruptured: Secondary | ICD-10-CM

## 2019-03-20 ENCOUNTER — Other Ambulatory Visit: Payer: Self-pay | Admitting: Radiology

## 2019-03-20 ENCOUNTER — Other Ambulatory Visit: Payer: Self-pay | Admitting: Student

## 2019-03-22 ENCOUNTER — Encounter (HOSPITAL_COMMUNITY): Payer: Self-pay

## 2019-03-22 ENCOUNTER — Ambulatory Visit (HOSPITAL_COMMUNITY)
Admission: RE | Admit: 2019-03-22 | Discharge: 2019-03-22 | Disposition: A | Discharge status: Home / Self Care | Payer: Medicare Other | Source: Ambulatory Visit | Attending: Interventional Radiology | Admitting: Interventional Radiology

## 2019-03-22 ENCOUNTER — Ambulatory Visit (HOSPITAL_COMMUNITY): Payer: Medicare Other

## 2019-03-22 ENCOUNTER — Other Ambulatory Visit: Payer: Self-pay

## 2019-03-22 ENCOUNTER — Other Ambulatory Visit (HOSPITAL_COMMUNITY): Payer: Self-pay | Admitting: Interventional Radiology

## 2019-03-22 DIAGNOSIS — J45909 Unspecified asthma, uncomplicated: Secondary | ICD-10-CM | POA: Insufficient documentation

## 2019-03-22 DIAGNOSIS — Z7984 Long term (current) use of oral hypoglycemic drugs: Secondary | ICD-10-CM | POA: Diagnosis not present

## 2019-03-22 DIAGNOSIS — Z7951 Long term (current) use of inhaled steroids: Secondary | ICD-10-CM | POA: Insufficient documentation

## 2019-03-22 DIAGNOSIS — Z833 Family history of diabetes mellitus: Secondary | ICD-10-CM | POA: Diagnosis not present

## 2019-03-22 DIAGNOSIS — Z8249 Family history of ischemic heart disease and other diseases of the circulatory system: Secondary | ICD-10-CM | POA: Diagnosis not present

## 2019-03-22 DIAGNOSIS — M1990 Unspecified osteoarthritis, unspecified site: Secondary | ICD-10-CM | POA: Diagnosis not present

## 2019-03-22 DIAGNOSIS — Z7902 Long term (current) use of antithrombotics/antiplatelets: Secondary | ICD-10-CM | POA: Insufficient documentation

## 2019-03-22 DIAGNOSIS — Z885 Allergy status to narcotic agent status: Secondary | ICD-10-CM | POA: Insufficient documentation

## 2019-03-22 DIAGNOSIS — Z8673 Personal history of transient ischemic attack (TIA), and cerebral infarction without residual deficits: Secondary | ICD-10-CM | POA: Insufficient documentation

## 2019-03-22 DIAGNOSIS — K219 Gastro-esophageal reflux disease without esophagitis: Secondary | ICD-10-CM | POA: Insufficient documentation

## 2019-03-22 DIAGNOSIS — Z823 Family history of stroke: Secondary | ICD-10-CM | POA: Insufficient documentation

## 2019-03-22 DIAGNOSIS — H8109 Meniere's disease, unspecified ear: Secondary | ICD-10-CM | POA: Diagnosis not present

## 2019-03-22 DIAGNOSIS — I771 Stricture of artery: Secondary | ICD-10-CM

## 2019-03-22 DIAGNOSIS — Z888 Allergy status to other drugs, medicaments and biological substances status: Secondary | ICD-10-CM | POA: Diagnosis not present

## 2019-03-22 DIAGNOSIS — E119 Type 2 diabetes mellitus without complications: Secondary | ICD-10-CM | POA: Insufficient documentation

## 2019-03-22 DIAGNOSIS — Z8679 Personal history of other diseases of the circulatory system: Secondary | ICD-10-CM | POA: Diagnosis not present

## 2019-03-22 DIAGNOSIS — I671 Cerebral aneurysm, nonruptured: Secondary | ICD-10-CM | POA: Insufficient documentation

## 2019-03-22 DIAGNOSIS — E785 Hyperlipidemia, unspecified: Secondary | ICD-10-CM | POA: Insufficient documentation

## 2019-03-22 DIAGNOSIS — Z79899 Other long term (current) drug therapy: Secondary | ICD-10-CM | POA: Diagnosis not present

## 2019-03-22 DIAGNOSIS — Z886 Allergy status to analgesic agent status: Secondary | ICD-10-CM | POA: Insufficient documentation

## 2019-03-22 DIAGNOSIS — I341 Nonrheumatic mitral (valve) prolapse: Secondary | ICD-10-CM | POA: Diagnosis not present

## 2019-03-22 DIAGNOSIS — Z887 Allergy status to serum and vaccine status: Secondary | ICD-10-CM | POA: Insufficient documentation

## 2019-03-22 DIAGNOSIS — Z96652 Presence of left artificial knee joint: Secondary | ICD-10-CM | POA: Insufficient documentation

## 2019-03-22 DIAGNOSIS — I6601 Occlusion and stenosis of right middle cerebral artery: Secondary | ICD-10-CM | POA: Diagnosis not present

## 2019-03-22 DIAGNOSIS — I10 Essential (primary) hypertension: Secondary | ICD-10-CM | POA: Diagnosis not present

## 2019-03-22 DIAGNOSIS — I6521 Occlusion and stenosis of right carotid artery: Secondary | ICD-10-CM | POA: Diagnosis not present

## 2019-03-22 HISTORY — PX: IR ANGIO VERTEBRAL SEL SUBCLAVIAN INNOMINATE UNI L MOD SED: IMG5364

## 2019-03-22 HISTORY — PX: IR US GUIDE VASC ACCESS RIGHT: IMG2390

## 2019-03-22 HISTORY — PX: IR ANGIO INTRA EXTRACRAN SEL COM CAROTID INNOMINATE BILAT MOD SED: IMG5360

## 2019-03-22 HISTORY — PX: IR ANGIO VERTEBRAL SEL VERTEBRAL UNI R MOD SED: IMG5368

## 2019-03-22 LAB — BASIC METABOLIC PANEL
Anion gap: 14 (ref 5–15)
BUN: 19 mg/dL (ref 8–23)
CO2: 24 mmol/L (ref 22–32)
Calcium: 8.9 mg/dL (ref 8.9–10.3)
Chloride: 104 mmol/L (ref 98–111)
Creatinine, Ser: 0.69 mg/dL (ref 0.44–1.00)
GFR calc Af Amer: >60 mL/min (ref >60)
GFR calc non Af Amer: >60 mL/min (ref >60)
Glucose, Bld: 116 mg/dL — ABNORMAL HIGH (ref 70–99)
Potassium: 4.6 mmol/L (ref 3.5–5.1)
Sodium: 142 mmol/L (ref 135–145)

## 2019-03-22 LAB — CBC WITH DIFFERENTIAL/PLATELET
Abs Immature Granulocytes: 0.01 10*3/uL (ref 0.00–0.07)
Basophils Absolute: 0 10*3/uL (ref 0.0–0.1)
Basophils Relative: 1 %
Eosinophils Absolute: 0.2 10*3/uL (ref 0.0–0.5)
Eosinophils Relative: 3 %
HCT: 42.8 % (ref 36.0–46.0)
Hemoglobin: 13.6 g/dL (ref 12.0–15.0)
Immature Granulocytes: 0 %
Lymphocytes Relative: 53 %
Lymphs Abs: 2.7 10*3/uL (ref 0.7–4.0)
MCH: 29.1 pg (ref 26.0–34.0)
MCHC: 31.8 g/dL (ref 30.0–36.0)
MCV: 91.6 fL (ref 80.0–100.0)
Monocytes Absolute: 0.4 10*3/uL (ref 0.1–1.0)
Monocytes Relative: 8 %
Neutro Abs: 1.7 10*3/uL (ref 1.7–7.7)
Neutrophils Relative %: 35 %
Platelets: 271 10*3/uL (ref 150–400)
RBC: 4.67 MIL/uL (ref 3.87–5.11)
RDW: 14.6 % (ref 11.5–15.5)
WBC: 5 10*3/uL (ref 4.0–10.5)
nRBC: 0 % (ref 0.0–0.2)

## 2019-03-22 LAB — APTT: aPTT: 21 seconds — ABNORMAL LOW (ref 24–36)

## 2019-03-22 LAB — PROTIME-INR
INR: 0.9 (ref 0.8–1.2)
Prothrombin Time: 11.9 seconds (ref 11.4–15.2)

## 2019-03-22 MED ORDER — HYDRALAZINE HCL 20 MG/ML IJ SOLN
INTRAMUSCULAR | Status: AC
  Filled 2019-03-22: qty 1

## 2019-03-22 MED ORDER — IOHEXOL 300 MG/ML  SOLN
150.0000 mL | Freq: Once | INTRAMUSCULAR | Status: AC | PRN
  Administered 2019-03-22: 70 mL via INTRA_ARTERIAL

## 2019-03-22 MED ORDER — NITROGLYCERIN 1 MG/10 ML FOR IR/CATH LAB
INTRA_ARTERIAL | Status: AC
  Filled 2019-03-22: qty 10

## 2019-03-22 MED ORDER — FENTANYL CITRATE (PF) 100 MCG/2ML IJ SOLN
INTRAMUSCULAR | Status: AC
  Filled 2019-03-22: qty 2

## 2019-03-22 MED ORDER — VERAPAMIL HCL 2.5 MG/ML IV SOLN
INTRAVENOUS | Status: AC | PRN
  Administered 2019-03-22: 2.5 mg via INTRA_ARTERIAL

## 2019-03-22 MED ORDER — HEPARIN SODIUM (PORCINE) 1000 UNIT/ML IJ SOLN
INTRAMUSCULAR | Status: AC
  Filled 2019-03-22: qty 1

## 2019-03-22 MED ORDER — SODIUM CHLORIDE 0.9 % IV SOLN: INTRAVENOUS | Status: AC

## 2019-03-22 MED ORDER — NITROGLYCERIN 1 MG/10 ML FOR IR/CATH LAB
INTRA_ARTERIAL | Status: AC | PRN
  Administered 2019-03-22 (×2): 200 ug via INTRA_ARTERIAL

## 2019-03-22 MED ORDER — SODIUM CHLORIDE 0.9 % IV SOLN: INTRAVENOUS | Status: DC

## 2019-03-22 MED ORDER — HYDROCODONE-ACETAMINOPHEN 5-325 MG PO TABS
ORAL_TABLET | ORAL | Status: AC
  Administered 2019-03-22: 1
  Filled 2019-03-22: qty 1

## 2019-03-22 MED ORDER — VERAPAMIL HCL 2.5 MG/ML IV SOLN
INTRAVENOUS | Status: AC
  Filled 2019-03-22: qty 2

## 2019-03-22 MED ORDER — HYDROCODONE-ACETAMINOPHEN 5-325 MG PO TABS: 1.0000 | ORAL_TABLET | Freq: Once | ORAL | Status: DC

## 2019-03-22 MED ORDER — FENTANYL CITRATE (PF) 100 MCG/2ML IJ SOLN
INTRAMUSCULAR | Status: AC | PRN
  Administered 2019-03-22 (×2): 25 ug via INTRAVENOUS

## 2019-03-22 MED ORDER — MIDAZOLAM HCL 2 MG/2ML IJ SOLN
INTRAMUSCULAR | Status: AC | PRN
  Administered 2019-03-22: 1 mg via INTRAVENOUS

## 2019-03-22 MED ORDER — HYDRALAZINE HCL 20 MG/ML IJ SOLN
INTRAMUSCULAR | Status: AC | PRN
  Administered 2019-03-22 (×2): 5 mg via INTRAVENOUS

## 2019-03-22 MED ORDER — HEPARIN SODIUM (PORCINE) 1000 UNIT/ML IJ SOLN
INTRAMUSCULAR | Status: AC | PRN
  Administered 2019-03-22: 2000 [IU] via INTRA_ARTERIAL

## 2019-03-22 MED ORDER — LIDOCAINE HCL 1 % IJ SOLN
INTRAMUSCULAR | Status: AC
  Filled 2019-03-22: qty 20

## 2019-03-22 MED ORDER — MIDAZOLAM HCL 2 MG/2ML IJ SOLN
INTRAMUSCULAR | Status: AC
  Filled 2019-03-22: qty 2

## 2019-03-22 NOTE — Progress Notes (Signed)
Ambulated in the hallway tol well

## 2019-03-22 NOTE — H&P (Signed)
Chief Complaint: Patient was seen in consultation today for right MCA M1 stenosis and right ICA caval cavernous aneurysm  Referring Physician(s): Melancon, York Ram  Supervising Physician: Luanne Bras  Patient Status: [facility] - Out-pt  History of Present Illness: Sherry [name] is a 68 y.o. female with a past medical history significant for DJD, asthma, GERD, CHF, mitral valve prolapse, meniere's disease, TIA and CVA who presents today for an image guided diagnostic cerebral angiogram. Sherry [name] was previously seen in consultation with Dr. Estanislado Pandy on 03/14/19 - please see this consult note for full details. Briefly, she has been followed by by Dr. 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 She has been followed with regular imaging however recently she has developed progressively worsening headaches and the recommendation was made to proceed with a diagnostic image guided cerebral angiogram for further evaluation.   Sherry [name] states that she feels ok right now, except that she is hungry, but she has been experiencing severe headaches typically over the right front side of her face and top of her right head. She describes the headaches as "intense pressure" that last for several hours and sometimes even an entire day. Medications typically do not help with the headaches although rest does help some. She also has been experiencing worsening bilateral blurry vision which she initially thought was cataracts however she was recently seen by her ophthalmologist who stated the blurry vision was not due to cataracts. She describes the blurriness as a  "dark fog that comes over my eyes" - she denies curtain falling sensation. She is very motivated to have the procedure done today in hopes that she can have her stenosis/aneurysm fixed. She tells me that she has not taken Aspirin in over a year as she was told previously by someone at her neurologist's office (she thinks might have been an NP with Dr. Leonie Man but she is not certain) that she should not be on the medication for more than 1 year. She continues to take her Plavix without issue.   Past Medical History:  Diagnosis Date  . Asthma   . Cerebral aneurysm   . Complication of anesthesia HYPOTENSION INTRAOP W/ HYSTERECTOMY IN 1987--  DID OK W/ TOTAL KNEE IN 2008  . Diabetes mellitus   . DJD (degenerative joint disease)   . Dyslipidemia   . GERD (gastroesophageal reflux disease)   . H/O hiatal hernia   . History of CHF (congestive heart failure) 2010-  RESOLVED  . History of peptic ulcer YRS AGO  . Hypertension   . Meniere's disease   . Mitral valve prolapse occasional palpitations w/ chest discomfort  . OA (osteoarthritis)   . Rotator cuff tear, left   . Stroke [facility])     Past Surgical History:  Procedure Laterality Date  . BUNIONECTOMY  2007   LEFT FOOT  . LEFT SHOULDER SURGERY  1997   ROTATOR CUFF REPAIR  . SHOULDER ARTHROSCOPY  09/20/2011   Procedure: ARTHROSCOPY SHOULDER;  Surgeon: Johnn Hai, MD;  Location: [facility];  Service: Orthopedics;  Laterality: Left;  SAD AND MINI OPEN ROTATOR CUFF REPAIR    . TOTAL KNEE ARTHROPLASTY  09-12-2006  [facility]   RIGHT KNEE  . TOTAL KNEE ARTHROPLASTY Left 11/30/2017   Procedure: LEFT TOTAL KNEE REPLACEMENT;  Surgeon: [name], MD;  Location: [place];  Service: Orthopedics;  Laterality: Left;  . TUBAL LIGATION  1976  . TYMPANOPLASTY  1990;   1980  . VAGINAL HYSTERECTOMY  1987    Allergies: Influenza vaccines, Demerol, Nsaids, Prednisone, Tramadol, Codeine, and Mango flavor [flavoring agent]  Medications: Prior  to Admission medications   Medication Sig Start Date End Date Taking? Authorizing Provider  albuterol (PROAIR HFA) 108 (90 Base) MCG/ACT inhaler Inhale 1-2 puffs into the lungs every 6 (six) hours as needed for wheezing or shortness of breath. 12/07/16   Copland, Gay Filler, MD  albuterol (PROVENTIL) (2.5 MG/3ML) 0.083% nebulizer solution Take 3 mLs (2.5 mg total) by nebulization every 6 (six) hours as needed for wheezing or shortness of breath. 07/16/16   Copland, Gay Filler, MD  aspirin (BAYER ASPIRIN) 325 MG tablet Take 1 tablet (325 mg total) by mouth daily. 12/02/17   [name], MD  Bioflavonoid Products (VITAMIN C) CHEW Chew 2 each by mouth daily.    [provider]  blood glucose meter kit and supplies KIT Dispense based on patient and insurance preference. Use up to four times daily as directed. (FOR ICD-9 250.00, 250.01). Pt uses once a day 03/23/18   Copland, Gay Filler, MD  cetirizine (ZYRTEC) 10 MG tablet Take 1 tablet (10 mg total) by mouth daily. 07/16/16   Copland, Gay Filler, MD  clopidogrel (PLAVIX) 75 MG tablet TAKE 1 TABLET (75 MG TOTAL) BY MOUTH DAILY. 09/25/18   Copland, Gay Filler, MD  docusate sodium (COLACE) 100 MG capsule Take 1 capsule (100 mg total) by mouth daily. 07/16/16   Copland, Gay Filler, MD  empagliflozin (JARDIANCE) 10 MG TABS tablet TAKE 10 mg TABLET BY MOUTH  DAILY 03/22/18   Copland, Gay Filler, MD  famotidine (PEPCID) 10 MG tablet Take 10 mg by mouth daily.    [provider]  fluticasone (FLOVENT HFA) 220 MCG/ACT inhaler Inhale 1 puff into the lungs 2 (two) times daily. May increase to 2 puffs if needed Patient taking differently: Inhale 2 puffs into the lungs 2 (two) times daily as needed (for shorness of breath or wheezing).  08/25/16   Copland, Gay Filler, MD  furosemide (LASIX) 20 MG tablet Take 1 tablet (20 mg total) by mouth daily. 11/20/18   Copland, Gay Filler, MD  gabapentin (NEURONTIN) 300 MG capsule TAKE 2 CAPSULES BY MOUTH TWO TIMES DAILY  09/25/18   Copland, Gay Filler, MD  glucose blood (ACCU-CHEK AVIVA PLUS) test strip Use as instructed 02/14/18   Copland, Gay Filler, MD  HYDROcodone-acetaminophen (NORCO/VICODIN) 5-325 MG tablet Norco 5-325 1 PO TID PRN FOR PAIN 12/13/17   [name], MD  ipratropium (ATROVENT) 0.03 % nasal spray PLACE 2 SPRAYS INTO THE NOSE 4 (FOUR) TIMES DAILY. 01/02/19   Copland, Gay Filler, MD  Lancets Misc. (ACCU-CHEK SOFTCLIX LANCET DEV) KIT USE AS DIRECTED 03/18/17   Renato Shin, MD  linagliptin (TRADJENTA) 5 MG TABS tablet Take 0.5 tablets (2.5 mg total) by mouth daily. 03/22/18   Copland, Gay Filler, MD  lisinopril (ZESTRIL) 20 MG tablet Take 1 tablet (20 mg total) by mouth daily. 02/12/19   Copland, Gay Filler, MD  metFORMIN (GLUCOPHAGE) 500 MG tablet TAKE 2 TABLETS (1,000 MG TOTAL) BY MOUTH 2 (TWO) TIMES DAILY. 01/02/19   Copland, Gay Filler, MD  methocarbamol (ROBAXIN) 500 MG tablet  Take 1 tablet (500 mg total) by mouth 3 (three) times daily. 09/26/18   Ann Held, DO  metoprolol tartrate (LOPRESSOR) 25 MG tablet Take 1 tablet (25 mg total) by mouth 2 (two) times daily. 02/12/19   Copland, Gay Filler, MD  Multiple Vitamins-Minerals (MULTIVITAMIN PO) Take 2 tablets by mouth daily.    [provider]  QUEtiapine (SEROQUEL) 100 MG tablet TAKE 1/2 TABLET (50 MG TOTAL) BY MOUTH AT BEDTIME. 09/25/18   Copland, Gay Filler, MD  sertraline (ZOLOFT) 50 MG tablet TAKE 1/2 TABLET (25 MG TOTAL) BY MOUTH AT BEDTIME. 10/03/18   Copland, Gay Filler, MD     Family History  Problem Relation Age of Onset  . Heart disease Mother        in her 71s, heart attack  . Diabetes Mother   . Kidney disease Mother        dialysis  . Thyroid disease Mother   . Aneurysm Mother   . Ovarian cancer Mother   . Breast cancer Mother   . Thyroid disease Sister   . Heart disease Sister   . Lymphoma Father   . Diabetes Father   . Hypertension Father   . Congestive Heart Failure Father   . Thyroid disease Brother   . Thyroid  disease Paternal Grandmother   . Heart attack Sister        at age 56  . Stroke Sister   . Hypertension Sister   . Breast cancer Maternal Aunt   . Breast cancer Maternal Grandmother   . Breast cancer Maternal Aunt   . Breast cancer Maternal Aunt   . Other Brother        Musician accident  . Bipolar disorder Son   . Other Son        GSW    Social History   Socioeconomic History  . Marital status: Widowed    Spouse name: Not on file  . Number of children: 4  . Years of education: BA  . Highest education level: Not on file  Occupational History  . Occupation: LPN - retired  Tobacco Use  . Smoking status: Never Smoker  . Smokeless tobacco: Never Used  Substance and Sexual Activity  . Alcohol use: No  . Drug use: No  . Sexual activity: Not on file  Other Topics Concern  . Not on file  Social History Narrative  . Not on file   Social Determinants of Health   Financial Resource Strain:   . Difficulty of Paying Living Expenses: Not on file  Food Insecurity:   . Worried About Charity fundraiser in the Last Year: Not on file  . Ran Out of Food in the Last Year: Not on file  Transportation Needs:   . Lack of Transportation (Medical): Not on file  . Lack of Transportation (Non-Medical): Not on file  Physical Activity:   . Days of Exercise per Week: Not on file  . Minutes of Exercise per Session: Not on file  Stress:   . Feeling of Stress : Not on file  Social Connections:   . Frequency of Communication with Friends and Family: Not on file  . Frequency of Social Gatherings with Friends and Family: Not on file  . Attends Religious Services: Not on file  . Active Member of Clubs or Organizations: Not on file  . Attends Archivist Meetings: Not on file  . Marital Status: Not on file     Review of  Systems: A 12 point ROS discussed and pertinent positives are indicated in the HPI above.  All other systems are negative.  Review of Systems  Constitutional: Negative  for chills and fever.  HENT: Negative for nosebleeds.   Eyes: Positive for visual disturbance (+) blurry vision bilaterally - "like a dark fog coming over my eyes".  Respiratory: Negative for cough and shortness of breath.   Cardiovascular: Negative for chest pain.  Gastrointestinal: Negative for abdominal pain, blood in stool, diarrhea, nausea and vomiting.  Genitourinary: Negative for dysuria and hematuria.  Musculoskeletal: Negative for back pain.  Neurological: Positive for headaches. Negative for dizziness, syncope, facial asymmetry, speech difficulty, weakness, light-headedness and numbness.  Hematological: Does not bruise/bleed easily.  Psychiatric/Behavioral: Positive for confusion ("sometimes it feels hard to think").    Vital Signs: There were no vitals taken for this visit.  Physical Exam Vitals reviewed.  Constitutional:      General: She is not in acute distress. HENT:     Head: Normocephalic.     Mouth/Throat:     Mouth: Mucous membranes are moist.     Pharynx: Oropharynx is clear. No oropharyngeal exudate or posterior oropharyngeal erythema.  Cardiovascular:     Rate and Rhythm: Normal rate and regular rhythm.     Pulses: Normal pulses.  Pulmonary:     Effort: Pulmonary effort is normal.     Breath sounds: Normal breath sounds.  Abdominal:     General: Bowel sounds are normal. There is no distension.     Palpations: Abdomen is soft.     Tenderness: There is no abdominal tenderness.  Skin:    General: Skin is warm and dry.  Neurological:     General: No focal deficit present.     Mental Status: She is alert and oriented to person, place, and time.     Cranial Nerves: No cranial nerve deficit.     Motor: No weakness.  Psychiatric:        Mood and Affect: Mood normal.        Behavior: Behavior normal.        Thought Content: Thought content normal.        Judgment: Judgment normal.      MD Evaluation Airway: WNL Heart: WNL Abdomen: WNL Chest/ Lungs:  WNL ASA  Classification: 2 Mallampati/Airway Score: One   Imaging: No results found.  Labs:  CBC: Recent Labs    03/22/19 0641  WBC 5.0  HGB 13.6  HCT 42.8  PLT 271    COAGS: Recent Labs    03/22/19 0641  INR 0.9  APTT 21*    BMP: Recent Labs    03/22/19 0641  NA 142  K 4.6  CL 104  CO2 24  GLUCOSE 116*  BUN 19  CALCIUM 8.9  CREATININE 0.69  GFRNONAA >60  GFRAA >60    LIVER FUNCTION TESTS: No results for input(s): BILITOT, AST, ALT, ALKPHOS, PROT, ALBUMIN in the last 8760 hours.  TUMOR MARKERS: No results for input(s): AFPTM, CEA, CA199, CHROMGRNA in the last 8760 hours.  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 Estanislado Pandy since 2016 who presents today for a repeat diagnostic cerebral angiogram due to recent worsening headaches.  Patient has been NPO since 8:30 pm yesterday, she continues to take Plavix 75 mg QD without issue, she has not taken Aspirin in over  a year as she states she was instructed to stop this by her neurologist. Afebrile, WBC 5.0, hgb 13.6, plt 271, creatinine 0.69, INR 0.9.  Risks and benefits of diagnostic cerebral angiogram were discussed with the patient including, but not limited to bleeding, infection, vascular injury, stroke, or contrast induced renal failure.  This interventional procedure involves the use of X-rays and because of the nature of the planned procedure, it is possible that we will have prolonged use of X-ray fluoroscopy. Potential radiation risks to you include (but are not limited to) the following: - A slightly elevated risk for cancer  several years later in life. This risk is typically less than 0.5% percent. This risk is low in comparison to the normal incidence of human cancer, which is 33% for women and 50% for men according to the [employer]. - Radiation induced injury  can include skin redness, resembling a rash, tissue breakdown / ulcers and hair loss (which can be temporary or permanent).  The likelihood of either of these occurring depends on the difficulty of the procedure and whether you are sensitive to radiation due to previous procedures, disease, or genetic conditions.  IF your procedure requires a prolonged use of radiation, you will be notified and given written instructions for further action.  It is your responsibility to monitor the irradiated area for the 2 weeks following the procedure and to notify your physician if you are concerned that you have suffered a radiation induced injury.    All of the patient's questions were answered, patient is agreeable to proceed.  Consent signed and in chart.  Thank you for this interesting consult.  I greatly enjoyed meeting Sherry [name] and look forward to participating in their care.  A copy of this report was sent to the requesting provider on this date.  Electronically Signed: Joaquim Nam, PA-C 03/22/2019, 7:05 AM   I spent a total of  25 Minutes in face to face in clinical consultation, greater than 50% of which was counseling/coordinating care for diagnostic cerebral angiogram.

## 2019-03-22 NOTE — Progress Notes (Signed)
Pt c/o right arm, shoulder, and wrist pain Acie Fredrickson, PA page

## 2019-03-22 NOTE — Sedation Documentation (Addendum)
16cc of air in TR band @ [phone]

## 2019-03-22 NOTE — Progress Notes (Signed)
Candiss Norse, PA paged

## 2019-03-22 NOTE — Procedures (Signed)
S/P 4 vessel cerebral arteriogram RT radial approach. Findings. 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 S.Marietta Sikkema MD

## 2019-03-22 NOTE — Discharge Instructions (Addendum)
Radial Site Care  This sheet gives you information about how to care for yourself after your procedure. Your health care provider may also give you more specific instructions. If you have problems or questions, contact your health care provider. What can I expect after the procedure? After the procedure, it is common to have:  Bruising and tenderness at the catheter insertion area. Follow these instructions at home: Medicines  Take over-the-counter and prescription medicines only as told by your health care provider. Insertion site care  Follow instructions from your health care provider about how to take care of your insertion site. Make sure you: ? Wash your hands with soap and water before you change your bandage (dressing). If soap and water are not available, use hand sanitizer. ? Change your dressing as told by your health care provider. ? Leave stitches (sutures), skin glue, or adhesive strips in place. These skin closures may need to stay in place for 2 weeks or longer. If adhesive strip edges start to loosen and curl up, you may trim the loose edges. Do not remove adhesive strips completely unless your health care provider tells you to do that.  Check your insertion site every day for signs of infection. Check for: ? Redness, swelling, or pain. ? Fluid or blood. ? Pus or a bad smell. ? Warmth.  Do not take baths, swim, or use a hot tub until your health care provider approves.  You may shower 24-48 hours after the procedure, or as directed by your health care provider. ? Remove the dressing and gently wash the site with plain soap and water. ? Pat the area dry with a clean towel. ? Do not rub the site. That could cause bleeding.  Do not apply powder or lotion to the site. Activity   For 24 hours after the procedure, or as directed by your health care provider: ? Do not flex or bend the affected arm. ? Do not push or pull heavy objects with the affected arm. ? Do not  drive yourself home from the hospital or clinic. You may drive 24 hours after the procedure unless your health care provider tells you not to. ? Do not operate machinery or power tools.  Do not lift anything that is heavier than 10 lb (4.5 kg), or the limit that you are told, until your health care provider says that it is safe.  Ask your health care provider when it is okay to: ? Return to work or school. ? Resume usual physical activities or sports. ? Resume sexual activity. General instructions  If the catheter site starts to bleed, raise your arm and put firm pressure on the site. If the bleeding does not stop, get help right away. This is a medical emergency.  If you went home on the same day as your procedure, a responsible adult should be with you for the first 24 hours after you arrive home.  Keep all follow-up visits as told by your health care provider. This is important. Contact a health care provider if:  You have a fever.  You have redness, swelling, or yellow drainage around your insertion site. Get help right away if:  You have unusual pain at the radial site.  The catheter insertion area swells very fast.  The insertion area is bleeding, and the bleeding does not stop when you hold steady pressure on the area.  Your arm or hand becomes pale, cool, tingly, or numb. These symptoms may represent a serious problem   that is an emergency. Do not wait to see if the symptoms will go away. Get medical help right away. Call your local emergency services (911 in the [place] Do not drive yourself to the hospital. Summary  After the procedure, it is common to have bruising and tenderness at the site.  Follow instructions from your health care provider about how to take care of your radial site wound. Check the wound every day for signs of infection.  Do not lift anything that is heavier than 10 lb (4.5 kg), or the limit that you are told, until your health care provider says  that it is safe. This information is not intended to replace advice given to you by your health care provider. Make sure you discuss any questions you have with your health care provider. Document Revised: 03/02/2017 Document Reviewed: 03/02/2017 Else[employer] Patient Education  2020 [employer] Angiogram, Care After This sheet gives you information about how to care for yourself after your procedure. Your doctor may also give you more specific instructions. If you have problems or questions, contact your doctor. Follow these instructions at home: Insertion site care  Follow instructions from your doctor about how to take care of your long, thin tube (catheter) insertion area. Make sure you: ? Wash your hands with soap and water before you change your bandage (dressing). If you cannot use soap and water, use hand sanitizer. ? Change your bandage as told by your doctor. ? Leave stitches (sutures), skin glue, or skin tape (adhesive) strips in place. They may need to stay in place for 2 weeks or longer. If tape strips get loose and curl up, you may trim the loose edges. Do not remove tape strips completely unless your doctor says it is okay.  Do not take baths, swim, or use a hot tub until your doctor says it is okay.  You may shower 24-48 hours after the procedure or as told by your doctor. ? Gently wash the area with plain soap and water. ? Pat the area dry with a clean towel. ? Do not rub the area. This may cause bleeding.  Do not apply powder or lotion to the area. Keep the area clean and dry.  Check your insertion area every day for signs of infection. Check for: ? More redness, swelling, or pain. ? Fluid or blood. ? Warmth. ? Pus or a bad smell. Activity  Rest as told by your doctor, usually for 1-2 days.  Do not lift anything that is heavier than 10 lbs. (4.5 kg) or as told by your doctor.  Do not drive for 24 hours if you were given a medicine to help you relax (sedative).  Do  not drive or use heavy machinery while taking prescription pain medicine. General instructions   Go back to your normal activities as told by your doctor, usually in about a week. Ask your doctor what activities are safe for you.  If the insertion area starts to bleed, lie flat and put pressure on the area. If the bleeding does not stop, get help right away. This is an emergency.  Drink enough fluid to keep your pee (urine) clear or pale yellow.  Take over-the-counter and prescription medicines only as told by your doctor.  Keep all follow-up visits as told by your doctor. This is important. Contact a doctor if:  You have a fever.  You have chills.  You have more redness, swelling, or pain around your insertion area.  You have fluid   or blood coming from your insertion area.  The insertion area feels warm to the touch.  You have pus or a bad smell coming from your insertion area.  You have more bruising around the insertion area.  Blood collects in the tissue around the insertion area (hematoma) that may be painful to the touch. Get help right away if:  You have a lot of pain in the insertion area.  The insertion area swells very fast.  The insertion area is bleeding, and the bleeding does not stop after holding steady pressure on the area.  The area near or just beyond the insertion area becomes pale, cool, tingly, or numb. These symptoms may be an emergency. Do not wait to see if the symptoms will go away. Get medical help right away. Call your local emergency services (911 in the [place] Do not drive yourself to the hospital. Summary  After the procedure, it is common to have bruising and tenderness at the long, thin tube insertion area.  After the procedure, it is important to rest and drink plenty of fluids.  Do not take baths, swim, or use a hot tub until your doctor says it is okay to do so. You may shower 24-48 hours after the procedure or as told by your  doctor.  If the insertion area starts to bleed, lie flat and put pressure on the area. If the bleeding does not stop, get help right away. This is an emergency. This information is not intended to replace advice given to you by your health care provider. Make sure you discuss any questions you have with your health care provider. Document Revised: 01/07/2017 Document Reviewed: 01/20/2016 Elsevier Patient Education  2020 [employer] Moderate Conscious Sedation, Adult Sedation is the use of medicines to promote relaxation and relieve discomfort and anxiety. Moderate conscious sedation is a type of sedation. Under moderate conscious sedation, you are less alert than normal, but you are still able to respond to instructions, touch, or both. Moderate conscious sedation is used during short medical and dental procedures. It is milder than deep sedation, which is a type of sedation under which you cannot be easily woken up. It is also milder than general anesthesia, which is the use of medicines to make you unconscious. Moderate conscious sedation allows you to return to your regular activities sooner. Tell a health care provider about:  Any allergies you have.  All medicines you are taking, including vitamins, herbs, eye drops, creams, and over-the-counter medicines.  Use of steroids (by mouth or creams).  Any problems you or family members have had with sedatives and anesthetic medicines.  Any blood disorders you have.  Any surgeries you have had.  Any medical conditions you have, such as sleep apnea.  Whether you are pregnant or may be pregnant.  Any use of cigarettes, alcohol, marijuana, or street drugs. What are the risks? Generally, this is a safe procedure. However, problems may occur, including:  Getting too much medicine (oversedation).  Nausea.  Allergic reaction to medicines.  Trouble breathing. If this happens, a breathing tube may be used to help with breathing. It will  be removed when you are awake and breathing on your own.  Heart trouble.  Lung trouble. What happens before the procedure? Staying hydrated Follow instructions from your health care provider about hydration, which may include:  Up to 2 hours before the procedure - you may continue to drink clear liquids, such as water, clear fruit juice, black coffee, and plain   tea. Eating and drinking restrictions Follow instructions from your health care provider about eating and drinking, which may include:  8 hours before the procedure - stop eating heavy meals or foods such as meat, fried foods, or fatty foods.  6 hours before the procedure - stop eating light meals or foods, such as toast or cereal.  6 hours before the procedure - stop drinking milk or drinks that contain milk.  2 hours before the procedure - stop drinking clear liquids. Medicine Ask your health care provider about:  Changing or stopping your regular medicines. This is especially important if you are taking diabetes medicines or blood thinners.  Taking medicines such as aspirin and ibuprofen. These medicines can thin your blood. Do not take these medicines before your procedure if your health care provider instructs you not to.  Tests and exams  You will have a physical exam.  You may have blood tests done to show: ? How well your kidneys and liver are working. ? How well your blood can clot. General instructions  Plan to have someone take you home from the hospital or clinic.  If you will be going home right after the procedure, plan to have someone with you for 24 hours. What happens during the procedure?  An IV tube will be inserted into one of your veins.  Medicine to help you relax (sedative) will be given through the IV tube.  The medical or dental procedure will be performed. What happens after the procedure?  Your blood pressure, heart rate, breathing rate, and blood oxygen level will be monitored often  until the medicines you were given have worn off.  Do not drive for 24 hours. This information is not intended to replace advice given to you by your health care provider. Make sure you discuss any questions you have with your health care provider. Document Revised: 01/07/2017 Document Reviewed: 05/17/2015 Elsevier Patient Education  2020 [employer]  

## 2019-03-22 NOTE — Progress Notes (Signed)
Discharge instructions reviewed with pt and her daughter (via telephone) both voice understanding.  

## 2019-03-23 ENCOUNTER — Other Ambulatory Visit (HOSPITAL_COMMUNITY): Payer: Self-pay | Admitting: Interventional Radiology

## 2019-03-23 DIAGNOSIS — I671 Cerebral aneurysm, nonruptured: Secondary | ICD-10-CM

## 2019-03-23 DIAGNOSIS — I771 Stricture of artery: Secondary | ICD-10-CM

## 2019-04-04 ENCOUNTER — Other Ambulatory Visit: Payer: Self-pay | Admitting: Family Medicine

## 2019-04-04 DIAGNOSIS — F320 Major depressive disorder, single episode, mild: Secondary | ICD-10-CM

## 2019-04-04 DIAGNOSIS — I10 Essential (primary) hypertension: Secondary | ICD-10-CM

## 2019-04-04 DIAGNOSIS — E118 Type 2 diabetes mellitus with unspecified complications: Secondary | ICD-10-CM

## 2019-04-04 MED FILL — LISINOPRIL 20 MG TABS: 20 | 90 days supply | Qty: 90 | Fill #0

## 2019-04-04 MED FILL — metFORMIN HCL 500 MG TABS: 500 | 90 days supply | Qty: 360 | Fill #1

## 2019-04-04 MED FILL — METOPROLOL TARTRATE 25 MG T: 25 | 90 days supply | Qty: 180 | Fill #0

## 2019-04-05 MED FILL — QUETIAPINE FUMARATE 100 MG: 100 | 30 days supply | Qty: 15 | Fill #0

## 2019-04-05 MED FILL — SERTRALINE HCL 50 MG TABLET: 50 | 30 days supply | Qty: 15 | Fill #0

## 2019-04-05 MED FILL — GABAPENTIN 300 MG CAPSULE: 300 | 23 days supply | Qty: 90 | Fill #0

## 2019-04-05 MED FILL — CLOPIDOGREL 75 MG TABLET: 75 | 30 days supply | Qty: 30 | Fill #0

## 2019-04-05 MED FILL — TRADJENTA 5 MG TABLET: 5 | 30 days supply | Qty: 15 | Fill #0

## 2019-04-11 NOTE — Progress Notes (Signed)
Nurse connected with patient 04/12/19 at 10:00 AM EST by a telephone enabled telemedicine application and verified that I am speaking with the correct person using two identifiers. Patient stated full name and DOB. Patient gave permission to continue with virtual visit. Patient's location was at home and Nurse's location was at [place] office.   Subjective:   Sherry [name] is a 69 y.o. female who presents for Medicare Annual (Subsequent) preventive examination.  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 Daughters lives near by and call her daily.  Review of Systems:  Home Safety/Smoke Alarms: Feels safe in home. Smoke alarms in place.  Lives alone in 2 story home. No trouble w/ stairs.    Female:      Mammo-  ordered     Dexa scan- ordered [facility]- 03/05/16. Recall 10 yrs.      Objective:     Vitals: Unable to assess. This visit is enabled though telemedicine due to Covid 19.   Advanced Directives 04/12/2019 03/22/2019 04/10/2018 11/30/2017 11/21/2017 03/03/2017 08/20/2016  Does Patient Have a Medical Advance Directive? _0  Yes No  Type of Advance Directive - - - - - Living will -  Would patient like information on creating a medical advance directive? No - Patient declined No - Patient declined No - Patient declined No - Patient declined No - Patient declined - No - Patient declined  Pre-existing out of facility DNR order (yellow form or pink MOST form) - - - - - - -    Tobacco Social History   Tobacco Use  Smoking Status Never Smoker  Smokeless Tobacco Never Used     Counseling given: Not Answered   Clinical Intake: Pain : No/denies pain     Past Medical History:  Diagnosis Date  . Asthma   . Cerebral aneurysm   . Complication of anesthesia HYPOTENSION INTRAOP W/ HYSTERECTOMY IN 1987--  DID OK W/ TOTAL KNEE IN 2008  . Diabetes mellitus   . DJD (degenerative joint disease)   . Dyslipidemia   . GERD (gastroesophageal reflux disease)   . H/O hiatal  hernia   . History of CHF (congestive heart failure) 2010-  RESOLVED  . History of peptic ulcer YRS AGO  . Hypertension   . Meniere's disease   . Mitral valve prolapse occasional palpitations w/ chest discomfort  . OA (osteoarthritis)   . Rotator cuff tear, left   . Stroke [facility])    Past Surgical History:  Procedure Laterality Date  . BUNIONECTOMY  2007   LEFT FOOT  . IR ANGIO INTRA EXTRACRAN SEL COM CAROTID INNOMINATE BILAT MOD SED  03/22/2019  . IR ANGIO VERTEBRAL SEL SUBCLAVIAN INNOMINATE UNI L MOD SED  03/22/2019  . IR ANGIO VERTEBRAL SEL VERTEBRAL UNI R MOD SED  03/22/2019  . IR US GUIDE VASC ACCESS RIGHT  03/22/2019  . LEFT SHOULDER SURGERY  1997   ROTATOR CUFF REPAIR  . SHOULDER ARTHROSCOPY  09/20/2011   Procedure: ARTHROSCOPY SHOULDER;  Surgeon: Johnn Hai, MD;  Location: [facility];  Service: Orthopedics;  Laterality: Left;  SAD AND MINI OPEN ROTATOR CUFF REPAIR    . TOTAL KNEE ARTHROPLASTY  09-12-2006  [facility]   RIGHT KNEE  . TOTAL KNEE ARTHROPLASTY Left 11/30/2017   Procedure: LEFT TOTAL KNEE REPLACEMENT;  Surgeon: [name], MD;  Location: [facility];  Service: Orthopedics;  Laterality: Left;  . TUBAL LIGATION  1976  . TYMPANOPLASTY  1990;  [facility]   Family History  Problem Relation Age of Onset  . Heart disease Mother        in her 89s, heart attack  . Diabetes Mother   . Kidney disease Mother        dialysis  . Thyroid disease Mother   . Aneurysm Mother   . Ovarian cancer Mother   . Breast cancer Mother   . Thyroid disease Sister   . Heart disease Sister   . Lymphoma Father   . Diabetes Father   . Hypertension Father   . Congestive Heart Failure Father   . Thyroid disease Brother   . Thyroid disease Paternal Grandmother   . Heart attack Sister        at age 13  . Stroke Sister   . Hypertension Sister   . Breast cancer Maternal Aunt   . Breast cancer Maternal Grandmother   . Breast cancer Maternal Aunt   .  Breast cancer Maternal Aunt   . Other Brother        Musician accident  . Bipolar disorder Son   . Other Son        GSW   Social History   Socioeconomic History  . Marital status: Widowed    Spouse name: Not on file  . Number of children: 4  . Years of education: BA  . Highest education level: Not on file  Occupational History  . Occupation: LPN - retired  Tobacco Use  . Smoking status: Never Smoker  . Smokeless tobacco: Never Used  Substance and Sexual Activity  . Alcohol use: No  . Drug use: No  . Sexual activity: Not on file  Other Topics Concern  . Not on file  Social History Narrative  . Not on file   Social Determinants of Health   Financial Resource Strain: Low Risk   . Difficulty of Paying Living Expenses: Not hard at all  Food Insecurity:   . Worried About Charity fundraiser in the Last Year: Not on file  . Ran Out of Food in the Last Year: Not on file  Transportation Needs: No Transportation Needs  . Lack of Transportation (Medical): No  . Lack of Transportation (Non-Medical): No  Physical Activity:   . Days of Exercise per Week: Not on file  . Minutes of Exercise per Session: Not on file  Stress:   . Feeling of Stress : Not on file  Social Connections:   . Frequency of Communication with Friends and Family: Not on file  . Frequency of Social Gatherings with Friends and Family: Not on file  . Attends Religious Services: Not on file  . Active Member of Clubs or Organizations: Not on file  . Attends Archivist Meetings: Not on file  . Marital Status: Not on file    Outpatient Encounter Medications as of 04/12/2019  Medication Sig  . Bioflavonoid Products (VITAMIN C) CHEW Chew 2 each by mouth daily.  . blood glucose meter kit and supplies KIT Dispense based on patient and insurance preference. Use up to four times daily as directed. (FOR ICD-9 250.00, 250.01). Pt uses once a day  . cetirizine (ZYRTEC) 10 MG tablet Take 1 tablet (10 mg total) by  mouth daily.  . clopidogrel (PLAVIX) 75 MG tablet TAKE 1 TABLET (75 MG TOTAL) BY MOUTH DAILY.  . famotidine (PEPCID) 10 MG tablet Take 10 mg by mouth daily.  . furosemide (LASIX) 20 MG  tablet Take 1 tablet (20 mg total) by mouth daily.  Marland Kitchen gabapentin (NEURONTIN) 300 MG capsule TAKE 2 CAPSULES BY MOUTH TWO TIMES DAILY  . glucose blood (ACCU-CHEK AVIVA PLUS) test strip Use as instructed  . Lancets Misc. (ACCU-CHEK SOFTCLIX LANCET DEV) KIT USE AS DIRECTED  . lisinopril (ZESTRIL) 20 MG tablet TAKE 1 TABLET BY MOUTH ONCE DAILY  . metFORMIN (GLUCOPHAGE) 500 MG tablet TAKE 2 TABLETS (1,000 MG TOTAL) BY MOUTH 2 (TWO) TIMES DAILY.  . metoprolol tartrate (LOPRESSOR) 25 MG tablet TAKE 1 TABLET BY MOUTH TWICE DAILY  . QUEtiapine (SEROQUEL) 100 MG tablet TAKE 1/2 TABLET (50 MG TOTAL) BY MOUTH AT BEDTIME.  Marland Kitchen sertraline (ZOLOFT) 50 MG tablet TAKE 1/2 TABLET (25 MG TOTAL) BY MOUTH AT BEDTIME.  . TRADJENTA 5 MG TABS tablet TAKE 1/2 TABLET BY MOUTH DAILY  . albuterol (PROAIR HFA) 108 (90 Base) MCG/ACT inhaler Inhale 1-2 puffs into the lungs every 6 (six) hours as needed for wheezing or shortness of breath. (Patient not taking: Reported on 04/12/2019)  . albuterol (PROVENTIL) (2.5 MG/3ML) 0.083% nebulizer solution Take 3 mLs (2.5 mg total) by nebulization every 6 (six) hours as needed for wheezing or shortness of breath. (Patient not taking: Reported on 04/12/2019)  . docusate sodium (COLACE) 100 MG capsule Take 1 capsule (100 mg total) by mouth daily. (Patient not taking: Reported on 04/12/2019)  . empagliflozin (JARDIANCE) 10 MG TABS tablet TAKE 10 mg TABLET BY MOUTH  DAILY (Patient not taking: Reported on 04/12/2019)  . fluticasone (FLOVENT HFA) 220 MCG/ACT inhaler Inhale 1 puff into the lungs 2 (two) times daily. May increase to 2 puffs if needed (Patient not taking: Reported on 04/12/2019)  . HYDROcodone-acetaminophen (NORCO/VICODIN) 5-325 MG tablet Norco 5-325 1 PO TID PRN FOR PAIN (Patient not taking: Reported on  04/12/2019)  . ipratropium (ATROVENT) 0.03 % nasal spray PLACE 2 SPRAYS INTO THE NOSE 4 (FOUR) TIMES DAILY. (Patient not taking: Reported on 04/12/2019)  . methocarbamol (ROBAXIN) 500 MG tablet Take 1 tablet (500 mg total) by mouth 3 (three) times daily. (Patient not taking: Reported on 04/12/2019)  . Multiple Vitamins-Minerals (MULTIVITAMIN PO) Take 2 tablets by mouth daily.  . [DISCONTINUED] aspirin (BAYER ASPIRIN) 325 MG tablet Take 1 tablet (325 mg total) by mouth daily.  . [DISCONTINUED] clopidogrel (PLAVIX) 75 MG tablet TAKE 1 TABLET (75 MG TOTAL) BY MOUTH DAILY.  . [DISCONTINUED] gabapentin (NEURONTIN) 300 MG capsule TAKE 2 CAPSULES BY MOUTH TWO TIMES DAILY  . [DISCONTINUED] linagliptin (TRADJENTA) 5 MG TABS tablet Take 0.5 tablets (2.5 mg total) by mouth daily.  . [DISCONTINUED] lisinopril (ZESTRIL) 20 MG tablet Take 1 tablet (20 mg total) by mouth daily.  . [DISCONTINUED] metoprolol tartrate (LOPRESSOR) 25 MG tablet Take 1 tablet (25 mg total) by mouth 2 (two) times daily.  . [DISCONTINUED] QUEtiapine (SEROQUEL) 100 MG tablet TAKE 1/2 TABLET (50 MG TOTAL) BY MOUTH AT BEDTIME.  . [DISCONTINUED] sertraline (ZOLOFT) 50 MG tablet TAKE 1/2 TABLET (25 MG TOTAL) BY MOUTH AT BEDTIME.   No facility-administered encounter medications on file as of 04/12/2019.    Activities of Daily Living In your present state of health, do you have any difficulty performing the following activities: 04/12/2019 03/22/2019  Hearing? N N  Vision? N N  Difficulty concentrating or making decisions? N N  Walking or climbing stairs? N N  Dressing or bathing? N N  Doing errands, shopping? N -  Preparing Food and eating ? N -  Using the Toilet? N -  In the past six months, have you accidently leaked urine? N -  Do you have problems with loss of bowel control? N -  Managing your Medications? N -  Managing your Finances? N -  Housekeeping or managing your Housekeeping? N -  Some recent data might be hidden    Patient  Care Team: Copland, Gay Filler, MD as PCP - General (Family Medicine) Debara Pickett Nadean Corwin, MD as PCP - Cardiology (Cardiology)    Assessment:   This is a routine wellness examination for Tayvia. Physical assessment deferred to PCP.  Exercise Activities and Dietary recommendations Current Exercise Habits: The patient has a physically strenuous job, but has no regular exercise apart from work., Exercise limited by: None identified Diet (meal preparation, eat out, water intake, caffeinated beverages, dairy products, fruits and vegetables): well balanced   Goals    . Increase physical activity       Fall Risk Fall Risk  04/12/2019 04/10/2018 02/14/2017 12/06/2016 05/26/2015  Falls in the past year? 0 0 Yes Yes Yes  Number falls in past yr: 0 - 2 or more 1 1  Comment - - 3 falls - -  Injury with Fall? 0 - No Yes No  Follow up Education provided;Falls prevention discussed - - - -   Depression Screen PHQ 2/9 Scores 04/12/2019 04/10/2018 12/06/2016 02/18/2015  PHQ - 2 Score 1 0 2 6  PHQ- 9 Score - - 8 21  Exception Documentation - - Other- indicate reason in comment box -  Not completed - - Loss of loved ones -     Cognitive Function Ad8 score reviewed for issues:  Issues making decisions:no  Less interest in hobbies / activities:no  Repeats questions, stories (family complaining):no  Trouble using ordinary gadgets (microwave, computer, phone):no  Forgets the month or year: no  Mismanaging finances: no  Remembering appts:no  Daily problems with thinking and/or memory:no Ad8 score is=0     MMSE - Mini Mental State Exam 04/10/2018  Orientation to time 5  Orientation to Place 5  Registration 3  Attention/ Calculation 5  Recall 3  Language- name 2 objects 2  Language- repeat 1  Language- follow 3 step command 3  Language- read & follow direction 1  Write a sentence 1  Copy design 1  Total score 30        Immunization History  Administered Date(s) Administered  . PPD Test  06/29/2013, 02/18/2015  . Pneumococcal Conjugate-13 12/06/2016  . Pneumococcal Polysaccharide-23 11/21/2009, 02/15/2018    Screening Tests Health Maintenance  Topic Date Due  . TETANUS/TDAP  01/07/1970  . MAMMOGRAM  07/16/2017  . OPHTHALMOLOGY EXAM  11/22/2017  . HEMOGLOBIN A1C  08/16/2018  . FOOT EXAM  11/15/2018  . COLONOSCOPY  03/05/2026  . DEXA SCAN  Completed  . Hepatitis C Screening  Completed  . PNA vac Low Risk Adult  Completed  . INFLUENZA VACCINE  Discontinued     Plan:    Please schedule your next medicare wellness visit with me in 1 yr.  Continue to eat heart healthy diet (full of fruits, vegetables, whole grains, lean protein, water--limit salt, fat, and sugar intake) and increase physical activity as tolerated.  Continue doing brain stimulating activities (puzzles, reading, adult coloring books, staying active) to keep memory sharp.   I have ordered your mammogram and bone density scan.  I have personally reviewed and noted the following in the patient's chart:   . Medical and social history . Use of alcohol, tobacco  or illicit drugs  . Current medications and supplements . Functional ability and status . Nutritional status . Physical activity . Advanced directives . List of other physicians . Hospitalizations, surgeries, and ER visits in previous 12 months . Vitals . Screenings to include cognitive, depression, and falls . Referrals and appointments  In addition, I have reviewed and discussed with patient certain preventive protocols, quality metrics, and best practice recommendations. A written personalized care plan for preventive services as well as general preventive health recommendations were provided to patient.     Shela Nevin, [place]  04/12/2019

## 2019-04-12 ENCOUNTER — Other Ambulatory Visit: Payer: Self-pay

## 2019-04-12 ENCOUNTER — Ambulatory Visit (INDEPENDENT_AMBULATORY_CARE_PROVIDER_SITE_OTHER): Payer: Medicare Other | Admitting: *Deleted

## 2019-04-12 ENCOUNTER — Encounter: Payer: Self-pay | Admitting: *Deleted

## 2019-04-12 DIAGNOSIS — Z0000 Encounter for general adult medical examination without abnormal findings: Secondary | ICD-10-CM

## 2019-04-12 DIAGNOSIS — Z1231 Encounter for screening mammogram for malignant neoplasm of breast: Secondary | ICD-10-CM

## 2019-04-12 DIAGNOSIS — Z780 Asymptomatic menopausal state: Secondary | ICD-10-CM

## 2019-04-12 NOTE — Patient Instructions (Signed)
Please schedule your next medicare wellness visit with me in 1 yr.  Continue to eat heart healthy diet (full of fruits, vegetables, whole grains, lean protein, water--limit salt, fat, and sugar intake) and increase physical activity as tolerated.  Continue doing brain stimulating activities (puzzles, reading, adult coloring books, staying active) to keep memory sharp.   I have ordered your mammogram and bone density scan.   [name] , Thank you for taking time to come for your Medicare Wellness Visit. I appreciate your ongoing commitment to your health goals. Please review the following plan we discussed and let me know if I can assist you in the future.   These are the goals we discussed: Goals    . Increase physical activity       This is a list of the screening recommended for you and due dates:  Health Maintenance  Topic Date Due  . Tetanus Vaccine  01/07/1970  . Mammogram  07/16/2017  . Eye exam for diabetics  11/22/2017  . Hemoglobin A1C  08/16/2018  . Complete foot exam   11/15/2018  . Colon Cancer Screening  03/05/2026  . DEXA scan (bone density measurement)  Completed  .  Hepatitis C: One time screening is recommended by Center for Disease Control  (CDC) for  adults born from [birth year] through 1965.   Completed  . Pneumonia vaccines  Completed  . Flu Shot  Discontinued    Preventive Care 65 Years and Older, Female Preventive care refers to lifestyle choices and visits with your health care provider that can promote health and wellness. This includes:  A yearly physical exam. This is also called an annual well check.  Regular dental and eye exams.  Immunizations.  Screening for certain conditions.  Healthy lifestyle choices, such as diet and exercise. What can I expect for my preventive care visit? Physical exam Your health care provider will check:  Height and weight. These may be used to calculate body mass index (BMI), which is a measurement that tells if  you are at a healthy weight.  Heart rate and blood pressure.  Your skin for abnormal spots. Counseling Your health care provider may ask you questions about:  Alcohol, tobacco, and drug use.  Emotional well-being.  Home and relationship well-being.  Sexual activity.  Eating habits.  History of falls.  Memory and ability to understand (cognition).  Work and work Statistician.  Pregnancy and menstrual history. What immunizations do I need?  Influenza (flu) vaccine  This is recommended every year. Tetanus, diphtheria, and pertussis (Tdap) vaccine  You may need a Td booster every 10 years. Varicella (chickenpox) vaccine  You may need this vaccine if you have not already been vaccinated. Zoster (shingles) vaccine  You may need this after age 45. Pneumococcal conjugate (PCV13) vaccine  One dose is recommended after age 67. Pneumococcal polysaccharide (PPSV23) vaccine  One dose is recommended after age 63. Measles, mumps, and rubella (MMR) vaccine  You may need at least one dose of MMR if you were born in 1957 or later. You may also need a second dose. Meningococcal conjugate (MenACWY) vaccine  You may need this if you have certain conditions. Hepatitis A vaccine  You may need this if you have certain conditions or if you travel or work in places where you may be exposed to hepatitis A. Hepatitis B vaccine  You may need this if you have certain conditions or if you travel or work in places where you may be exposed to  hepatitis B. Haemophilus influenzae type b (Hib) vaccine  You may need this if you have certain conditions. You may receive vaccines as individual doses or as more than one vaccine together in one shot (combination vaccines). Talk with your health care provider about the risks and benefits of combination vaccines. What tests do I need? Blood tests  Lipid and cholesterol levels. These may be checked every 5 years, or more frequently depending on  your overall health.  Hepatitis C test.  Hepatitis B test. Screening  Lung cancer screening. You may have this screening every year starting at age 59 if you have a 30-pack-year history of smoking and currently smoke or have quit within the past 15 years.  Colorectal cancer screening. All adults should have this screening starting at age 58 and continuing until age 36. Your health care provider may recommend screening at age 25 if you are at increased risk. You will have tests every 1-10 years, depending on your results and the type of screening test.  Diabetes screening. This is done by checking your blood sugar (glucose) after you have not eaten for a while (fasting). You may have this done every 1-3 years.  Mammogram. This may be done every 1-2 years. Talk with your health care provider about how often you should have regular mammograms.  BRCA-related cancer screening. This may be done if you have a family history of breast, ovarian, tubal, or peritoneal cancers. Other tests  Sexually transmitted disease (STD) testing.  Bone density scan. This is done to screen for osteoporosis. You may have this done starting at age 40. Follow these instructions at home: Eating and drinking  Eat a diet that includes fresh fruits and vegetables, whole grains, lean protein, and low-fat dairy products. Limit your intake of foods with high amounts of sugar, saturated fats, and salt.  Take vitamin and mineral supplements as recommended by your health care provider.  Do not drink alcohol if your health care provider tells you not to drink.  If you drink alcohol: ? Limit how much you have to 0-1 drink a day. ? Be aware of how much alcohol is in your drink. In the [place] one drink equals one 12 oz bottle of beer (355 mL), one 5 oz glass of wine (148 mL), or one 1 oz glass of hard liquor (44 mL). Lifestyle  Take daily care of your teeth and gums.  Stay active. Exercise for at least 30 minutes on 5 or  more days each week.  Do not use any products that contain nicotine or tobacco, such as cigarettes, e-cigarettes, and chewing tobacco. If you need help quitting, ask your health care provider.  If you are sexually active, practice safe sex. Use a condom or other form of protection in order to prevent STIs (sexually transmitted infections).  Talk with your health care provider about taking a low-dose aspirin or statin. What's next?  Go to your health care provider once a year for a well check visit.  Ask your health care provider how often you should have your eyes and teeth checked.  Stay up to date on all vaccines. This information is not intended to replace advice given to you by your health care provider. Make sure you discuss any questions you have with your health care provider. Document Revised: 01/19/2018 Document Reviewed: 01/19/2018 Elsevier Patient Education  2020 [employer].

## 2019-04-16 ENCOUNTER — Other Ambulatory Visit: Payer: Self-pay

## 2019-04-16 ENCOUNTER — Ambulatory Visit (INDEPENDENT_AMBULATORY_CARE_PROVIDER_SITE_OTHER): Payer: Medicare Other | Admitting: Family Medicine

## 2019-04-16 ENCOUNTER — Encounter: Payer: Self-pay | Admitting: Family Medicine

## 2019-04-16 VITALS — BP 144/88 | HR 63 | Temp 97.3°F | Resp 16 | Ht 61.0 in | Wt 182.0 lb

## 2019-04-16 DIAGNOSIS — E118 Type 2 diabetes mellitus with unspecified complications: Secondary | ICD-10-CM

## 2019-04-16 DIAGNOSIS — I10 Essential (primary) hypertension: Secondary | ICD-10-CM

## 2019-04-16 DIAGNOSIS — E785 Hyperlipidemia, unspecified: Secondary | ICD-10-CM

## 2019-04-16 DIAGNOSIS — G8929 Other chronic pain: Secondary | ICD-10-CM | POA: Diagnosis not present

## 2019-04-16 DIAGNOSIS — M25511 Pain in right shoulder: Secondary | ICD-10-CM

## 2019-04-16 DIAGNOSIS — G459 Transient cerebral ischemic attack, unspecified: Secondary | ICD-10-CM | POA: Diagnosis not present

## 2019-04-16 MED ORDER — HYDROCODONE-ACETAMINOPHEN 5-325 MG PO TABS: 0.5000 | ORAL_TABLET | Freq: Three times a day (TID) | ORAL | 0 refills | Status: DC | PRN

## 2019-04-16 MED FILL — HYDROCODON-APAP 5-325: 5-325 | 4 days supply | Qty: 12 | Fill #0

## 2019-04-16 NOTE — Patient Instructions (Signed)
Good to see you again today We will get routine labs today Please work on some of the stretches and exercises that I gave you today, for about 10 minutes 4x a week  You can use the hydrocodone sparingly as needed- try a 1/2 tablet first for pain.  Do not drive on this medication and use caution with any other sedating medication

## 2019-04-16 NOTE — Progress Notes (Addendum)
[facility] [place] [address], [address] [place], [place] [address] [phone] Fax [phone]  Date:  04/16/2019   Name:  Sherry [name]   DOB:  07/01/1950   MRN:  [id number]  PCP:  ,  C, MD    Chief Complaint: Shoulder Pain (no known injury, right shoulder, limited ROM)   History of Present Illness:  Sherry [name] is a 68 y.o. very pleasant female patient who presents with the following:  Patient here today with concern of RIGHT shoulder pain; getting worse the last 3 months or so She is not aware of any injury.  Abduction is painful, it hurts her at night and wakes her up sometimes from sleep It will sometimes feel stiff and heavy, she is right handed - makes it hard to do household tasks such as getting things off shelves or getting dressed She has tried OTC pain relievers with no success She tried robaxin but it did not really help her with pain She has a few drug allergies- has used hydrocodone in the recent past however which she tolerated ok   History of TIA 2016, hypertension, intracranial vascular stenosis, hypothyroidism, diabetes with peripheral neuropathy, CHF, hyperlipidemia Her vascular issues are followed by Dr. Deveshwar-interventional radiology Also history of brain aneurysm, most recent MRI January 2019  Last seen by myself in January 2020  Due for foot exam-we will complete today Eye exam;  Done in February  Mammogram overdue- she plans to schedule this today Labs are also due- will catch up today  Lab Results  Component Value Date   HGBA1C 6.1 02/15/2018    Patient Active Problem List   Diagnosis Date Noted  . Traumatic hematoma of left lower leg 09/26/2018  . S/P total knee arthroplasty, left 03/29/2017  . Spondylosis without myelopathy or radiculopathy, cervical region 03/29/2017  . Bilateral carpal tunnel syndrome 03/29/2017  . Acute asthma exacerbation 08/19/2016  . Cervicalgia 03/23/2016   . Vertigo 04/14/2015  . Anxiety state 01/22/2015  . HLD (hyperlipidemia) 01/22/2015  . Type 2 diabetes mellitus with circulatory disorder (HCC) 01/22/2015  . Intracranial vascular stenosis 01/22/2015  . Aneurysm (HCC)   . Headache   . Essential hypertension 11/12/2014  . Dependent edema 11/12/2014  . Peripheral neuropathy 11/12/2014  . Depression 11/12/2014  . Facial droop   . Chest pain   . TIA (transient ischemic attack) 11/11/2014  . Dyspnea 08/06/2014  . Hyperthyroidism 08/06/2014  . Meniere's disease 06/07/2014  . History of CHF (congestive heart failure) 06/07/2014  . Goiter 06/13/2013  . Obesity, unspecified 06/13/2013    Past Medical History:  Diagnosis Date  . Asthma   . Cerebral aneurysm   . Complication of anesthesia HYPOTENSION INTRAOP W/ HYSTERECTOMY IN 1987--  DID OK W/ TOTAL KNEE IN 2008  . Diabetes mellitus   . DJD (degenerative joint disease)   . Dyslipidemia   . GERD (gastroesophageal reflux disease)   . H/O hiatal hernia   . History of CHF (congestive heart failure) 2010-  RESOLVED  . History of peptic ulcer YRS AGO  . Hypertension   . Meniere's disease   . Mitral valve prolapse occasional palpitations w/ chest discomfort  . OA (osteoarthritis)   . Rotator cuff tear, left   . Stroke (HCC)     Past Surgical History:  Procedure Laterality Date  . BUNIONECTOMY  2007   LEFT FOOT  . IR ANGIO INTRA EXTRACRAN SEL COM CAROTID INNOMINATE BILAT MOD SED    03/22/2019  . IR ANGIO VERTEBRAL SEL SUBCLAVIAN INNOMINATE UNI L MOD SED  03/22/2019  . IR ANGIO VERTEBRAL SEL VERTEBRAL UNI R MOD SED  03/22/2019  . IR US GUIDE VASC ACCESS RIGHT  03/22/2019  . LEFT SHOULDER SURGERY  1997   ROTATOR CUFF REPAIR  . SHOULDER ARTHROSCOPY  09/20/2011   Procedure: ARTHROSCOPY SHOULDER;  Surgeon: Jeffrey C Beane, MD;  Location: [facility];  Service: Orthopedics;  Laterality: Left;  SAD AND MINI OPEN ROTATOR CUFF REPAIR    . TOTAL KNEE ARTHROPLASTY  09-12-2006   [facility]   RIGHT KNEE  . TOTAL KNEE ARTHROPLASTY Left 11/30/2017   Procedure: LEFT TOTAL KNEE REPLACEMENT;  Surgeon: Yates, Mark C, MD;  Location: [facility];  Service: Orthopedics;  Laterality: Left;  . TUBAL LIGATION  1976  . TYMPANOPLASTY  1990;   1980  . VAGINAL HYSTERECTOMY  1987    Social History   Tobacco Use  . Smoking status: Never Smoker  . Smokeless tobacco: Never Used  Substance Use Topics  . Alcohol use: No  . Drug use: No    Family History  Problem Relation Age of Onset  . Heart disease Mother        in her 50s, heart attack  . Diabetes Mother   . Kidney disease Mother        dialysis  . Thyroid disease Mother   . Aneurysm Mother   . Ovarian cancer Mother   . Breast cancer Mother   . Thyroid disease Sister   . Heart disease Sister   . Lymphoma Father   . Diabetes Father   . Hypertension Father   . Congestive Heart Failure Father   . Thyroid disease Brother   . Thyroid disease Paternal Grandmother   . Heart attack Sister        at age 60  . Stroke Sister   . Hypertension Sister   . Breast cancer Maternal Aunt   . Breast cancer Maternal Grandmother   . Breast cancer Maternal Aunt   . Breast cancer Maternal Aunt   . Other Brother        Car accident  . Bipolar disorder Son   . Other Son        GSW    Allergies  Allergen Reactions  . Influenza Vaccines Anaphylaxis  . Demerol Other (See Comments)    SEIZURE  . Nsaids Other (See Comments)    NAPROXEN, IBUPROFEN, SKELAXIN---  CAUSES PALPITATIONS  . Prednisone Other (See Comments)    Caused her glucose to go up to 500 and went to ED  . Tramadol Other (See Comments)    Contraindicated with Victoza   . Codeine Itching  . Mango Flavor [Flavoring Agent] Itching    Medication list has been reviewed and updated.  Current Outpatient Medications on File Prior to Visit  Medication Sig Dispense Refill  . blood glucose meter kit and supplies KIT Dispense based on patient and insurance preference. Use up to  four times daily as directed. (FOR ICD-9 250.00, 250.01). Pt uses once a day 1 each 0  . cetirizine (ZYRTEC) 10 MG tablet Take 1 tablet (10 mg total) by mouth daily. 90 tablet 3  . clopidogrel (PLAVIX) 75 MG tablet TAKE 1 TABLET (75 MG TOTAL) BY MOUTH DAILY. 30 tablet 0  . docusate sodium (COLACE) 100 MG capsule Take 1 capsule (100 mg total) by mouth daily. 10 capsule 2  . empagliflozin (JARDIANCE) 10 MG TABS tablet TAKE 10 mg   TABLET BY MOUTH  DAILY 90 tablet 1  . famotidine (PEPCID) 10 MG tablet Take 10 mg by mouth daily.    . fluticasone (FLOVENT HFA) 220 MCG/ACT inhaler Inhale 1 puff into the lungs 2 (two) times daily. May increase to 2 puffs if needed 1 Inhaler 12  . furosemide (LASIX) 20 MG tablet Take 1 tablet (20 mg total) by mouth daily. 90 tablet 0  . gabapentin (NEURONTIN) 300 MG capsule TAKE 2 CAPSULES BY MOUTH TWO TIMES DAILY 90 capsule 0  . glucose blood (ACCU-CHEK AVIVA PLUS) test strip Use as instructed 100 each 12  . HYDROcodone-acetaminophen (NORCO/VICODIN) 5-325 MG tablet Norco 5-325 1 PO TID PRN FOR PAIN 30 tablet 0  . ipratropium (ATROVENT) 0.03 % nasal spray PLACE 2 SPRAYS INTO THE NOSE 4 (FOUR) TIMES DAILY. 60 mL 1  . Lancets Misc. (ACCU-CHEK SOFTCLIX LANCET DEV) KIT USE AS DIRECTED 1 kit 0  . lisinopril (ZESTRIL) 20 MG tablet TAKE 1 TABLET BY MOUTH ONCE DAILY 30 tablet 0  . metFORMIN (GLUCOPHAGE) 500 MG tablet TAKE 2 TABLETS (1,000 MG TOTAL) BY MOUTH 2 (TWO) TIMES DAILY. 360 tablet 1  . methocarbamol (ROBAXIN) 500 MG tablet Take 1 tablet (500 mg total) by mouth 3 (three) times daily. 40 tablet 0  . metoprolol tartrate (LOPRESSOR) 25 MG tablet TAKE 1 TABLET BY MOUTH TWICE DAILY 60 tablet 0  . Multiple Vitamins-Minerals (MULTIVITAMIN PO) Take 2 tablets by mouth daily.    . QUEtiapine (SEROQUEL) 100 MG tablet TAKE 1/2 TABLET (50 MG TOTAL) BY MOUTH AT BEDTIME. 15 tablet 0  . sertraline (ZOLOFT) 50 MG tablet TAKE 1/2 TABLET (25 MG TOTAL) BY MOUTH AT BEDTIME. 15 tablet 0  .  TRADJENTA 5 MG TABS tablet TAKE 1/2 TABLET BY MOUTH DAILY 15 tablet 0   No current facility-administered medications on file prior to visit.    Review of Systems:  As per HPI- otherwise negative.  No fever chills Physical Examination: Vitals:   04/16/19 1530  BP: (!) 144/88  Pulse: 63  Resp: 16  Temp: (!) 97.3 F (36.3 C)  SpO2: 98%   Vitals:   04/16/19 1530  Weight: 182 lb (82.6 kg)  Height: 5' 1" (1.549 m)   Body mass index is 34.39 kg/m. Ideal Body Weight: Weight in (lb) to have BMI = 25: 132  GEN: no acute distress.  Obese, looks well HEENT: Atraumatic, Normocephalic.  Ears and Nose: No external deformity. CV: RRR, No M/G/R. No JVD. No thrill. No extra heart sounds. PULM: CTA B, no wheezes, crackles, rhonchi. No retractions. No resp. distress. No accessory muscle use. ABD: S, NT, ND, +BS. No rebound. No HSM. EXTR: No c/c/e PSYCH: Normally interactive. Conversant.  RIGHT shoulder; she has significant pain with palpation over the anterior shoulder, she is able to abduct and flex shoulder to about 90 degrees only.  She also has significant restriction of internal and external rotation on the right.  Weakness is present with empty can test Grip strength is normal, right elbow, wrist, hand normal Foot exam normal today    Assessment and Plan: Chronic right shoulder pain - Plan: Ambulatory referral to Orthopedic Surgery, HYDROcodone-acetaminophen (NORCO/VICODIN) 5-325 MG tablet  Essential hypertension - Plan: CBC, Comprehensive metabolic panel  Type 2 diabetes mellitus with complication, without long-term current use of insulin (HCC) - Plan: Hemoglobin A1c  TIA (transient ischemic attack)  Hyperlipidemia, unspecified hyperlipidemia type - Plan: Lipid panel  Here today to discuss right shoulder pain.  She has had pain  for about 3 months now, is already developing  restriction of range of motion.  Will refer to orthopedics, gave her a small supply of hydrocodone to  use sparingly as needed for pain.  Gave her a handout with some stretches and strengthening exercises that she may start work on now  Labs pending as above, check A1c  Have encouraged mammogram, foot exam done today  Will plan further follow- up pending labs. Moderate medical decision making This visit occurred during the SARS-CoV-2 public health emergency.  Safety protocols were in place, including screening questions prior to the visit, additional usage of staff PPE, and extensive cleaning of exam room while observing appropriate contact time as indicated for disinfecting solutions.   Signed  , MD  Received her labs 3/9- message to pt  Results for orders placed or performed in visit on 04/16/19  CBC  Result Value Ref Range   WBC 5.9 4.0 - 10.5 K/uL   RBC 4.38 3.87 - 5.11 Mil/uL   Platelets 251.0 150.0 - 400.0 K/uL   Hemoglobin 12.6 12.0 - 15.0 g/dL   HCT 39.3 36.0 - 46.0 %   MCV 89.8 78.0 - 100.0 fl   MCHC 32.1 30.0 - 36.0 g/dL   RDW 15.0 11.5 - 15.5 %  Comprehensive metabolic panel  Result Value Ref Range   Sodium 140 135 - 145 mEq/L   Potassium 4.0 3.5 - 5.1 mEq/L   Chloride 104 96 - 112 mEq/L   CO2 30 19 - 32 mEq/L   Glucose, Bld 108 (H) 70 - 99 mg/dL   BUN 16 6 - 23 mg/dL   Creatinine, Ser 0.66 0.40 - 1.20 mg/dL   Total Bilirubin 0.6 0.2 - 1.2 mg/dL   Alkaline Phosphatase 77 39 - 117 U/L   AST 12 0 - 37 U/L   ALT 11 0 - 35 U/L   Total Protein 6.4 6.0 - 8.3 g/dL   Albumin 4.1 3.5 - 5.2 g/dL   GFR 107.68 >60.00 mL/min   Calcium 9.5 8.4 - 10.5 mg/dL  Hemoglobin A1c  Result Value Ref Range   Hgb A1c MFr Bld 6.2 4.6 - 6.5 %  Lipid panel  Result Value Ref Range   Cholesterol 138 0 - 200 mg/dL   Triglycerides 91.0 0.0 - 149.0 mg/dL   HDL 63.80 >39.00 mg/dL   VLDL 18.2 0.0 - 40.0 mg/dL   LDL Cholesterol 56 0 - 99 mg/dL   Total CHOL/HDL Ratio 2    NonHDL 74.66      

## 2019-04-17 ENCOUNTER — Encounter: Payer: Self-pay | Admitting: Family Medicine

## 2019-04-17 LAB — LIPID PANEL
Cholesterol: 138 mg/dL (ref 0–200)
HDL: 63.8 mg/dL (ref >39.00)
LDL Cholesterol: 56 mg/dL (ref 0–99)
NonHDL: 74.66
Total CHOL/HDL Ratio: 2
Triglycerides: 91 mg/dL (ref 0.0–149.0)
VLDL: 18.2 mg/dL (ref 0.0–40.0)

## 2019-04-17 LAB — HEMOGLOBIN A1C: Hgb A1c MFr Bld: 6.2 % (ref 4.6–6.5)

## 2019-04-17 LAB — CBC
HCT: 39.3 % (ref 36.0–46.0)
Hemoglobin: 12.6 g/dL (ref 12.0–15.0)
MCHC: 32.1 g/dL (ref 30.0–36.0)
MCV: 89.8 fl (ref 78.0–100.0)
Platelets: 251 10*3/uL (ref 150.0–400.0)
RBC: 4.38 Mil/uL (ref 3.87–5.11)
RDW: 15 % (ref 11.5–15.5)
WBC: 5.9 10*3/uL (ref 4.0–10.5)

## 2019-04-17 LAB — COMPREHENSIVE METABOLIC PANEL
ALT: 11 U/L (ref 0–35)
AST: 12 U/L (ref 0–37)
Albumin: 4.1 g/dL (ref 3.5–5.2)
Alkaline Phosphatase: 77 U/L (ref 39–117)
BUN: 16 mg/dL (ref 6–23)
CO2: 30 mEq/L (ref 19–32)
Calcium: 9.5 mg/dL (ref 8.4–10.5)
Chloride: 104 mEq/L (ref 96–112)
Creatinine, Ser: 0.66 mg/dL (ref 0.40–1.20)
GFR: 107.68 mL/min (ref >60.00)
Glucose, Bld: 108 mg/dL — ABNORMAL HIGH (ref 70–99)
Potassium: 4 mEq/L (ref 3.5–5.1)
Sodium: 140 mEq/L (ref 135–145)
Total Bilirubin: 0.6 mg/dL (ref 0.2–1.2)
Total Protein: 6.4 g/dL (ref 6.0–8.3)

## 2019-04-20 ENCOUNTER — Encounter: Payer: Self-pay | Admitting: Family Medicine

## 2019-04-20 ENCOUNTER — Ambulatory Visit (HOSPITAL_BASED_OUTPATIENT_CLINIC_OR_DEPARTMENT_OTHER)
Admission: RE | Admit: 2019-04-20 | Discharge: 2019-04-20 | Disposition: A | Discharge status: Home / Self Care | Payer: Medicare Other | Source: Ambulatory Visit | Attending: Family Medicine | Admitting: Family Medicine

## 2019-04-20 ENCOUNTER — Other Ambulatory Visit: Payer: Self-pay

## 2019-04-20 ENCOUNTER — Other Ambulatory Visit: Payer: Self-pay | Admitting: Family Medicine

## 2019-04-20 DIAGNOSIS — Z780 Asymptomatic menopausal state: Secondary | ICD-10-CM | POA: Diagnosis not present

## 2019-04-20 DIAGNOSIS — Z1231 Encounter for screening mammogram for malignant neoplasm of breast: Secondary | ICD-10-CM | POA: Diagnosis not present

## 2019-04-20 DIAGNOSIS — E118 Type 2 diabetes mellitus with unspecified complications: Secondary | ICD-10-CM

## 2019-04-20 MED FILL — JARDIANCE 10 MG TABLET: 10 | 90 days supply | Qty: 90 | Fill #0

## 2019-04-23 ENCOUNTER — Other Ambulatory Visit: Payer: Self-pay | Admitting: Family Medicine

## 2019-04-23 DIAGNOSIS — R928 Other abnormal and inconclusive findings on diagnostic imaging of breast: Secondary | ICD-10-CM

## 2019-05-04 ENCOUNTER — Ambulatory Visit
Admission: RE | Admit: 2019-05-04 | Discharge: 2019-05-04 | Disposition: A | Discharge status: Home / Self Care | Payer: Medicare Other | Source: Ambulatory Visit | Attending: Family Medicine | Admitting: Family Medicine

## 2019-05-04 ENCOUNTER — Ambulatory Visit: Payer: Medicare Other

## 2019-05-04 ENCOUNTER — Ambulatory Visit: Payer: Medicare Other | Admitting: Physician Assistant

## 2019-05-04 ENCOUNTER — Other Ambulatory Visit: Payer: Self-pay

## 2019-05-04 ENCOUNTER — Other Ambulatory Visit: Payer: Self-pay | Admitting: Family Medicine

## 2019-05-04 DIAGNOSIS — R928 Other abnormal and inconclusive findings on diagnostic imaging of breast: Secondary | ICD-10-CM | POA: Diagnosis not present

## 2019-05-04 DIAGNOSIS — F320 Major depressive disorder, single episode, mild: Secondary | ICD-10-CM

## 2019-05-04 DIAGNOSIS — E118 Type 2 diabetes mellitus with unspecified complications: Secondary | ICD-10-CM

## 2019-05-04 MED FILL — GABAPENTIN 300 MG CAPSULE: 300 | 30 days supply | Qty: 120 | Fill #0

## 2019-05-04 MED FILL — SERTRALINE HCL 50 MG TABLET: 50 | 30 days supply | Qty: 15 | Fill #0

## 2019-05-04 MED FILL — TRADJENTA 5 MG TABLET: 5 | 30 days supply | Qty: 15 | Fill #0

## 2019-05-04 MED FILL — CLOPIDOGREL 75 MG TABLET: 75 | 30 days supply | Qty: 30 | Fill #0

## 2019-05-09 ENCOUNTER — Other Ambulatory Visit: Payer: Medicare Other

## 2019-05-15 ENCOUNTER — Telehealth (INDEPENDENT_AMBULATORY_CARE_PROVIDER_SITE_OTHER): Payer: Medicare Other | Admitting: Physician Assistant

## 2019-05-15 ENCOUNTER — Encounter: Payer: Self-pay | Admitting: Physician Assistant

## 2019-05-15 ENCOUNTER — Telehealth: Payer: Self-pay

## 2019-05-15 VITALS — Ht 61.0 in | Wt 184.2 lb

## 2019-05-15 DIAGNOSIS — E785 Hyperlipidemia, unspecified: Secondary | ICD-10-CM

## 2019-05-15 DIAGNOSIS — I10 Essential (primary) hypertension: Secondary | ICD-10-CM

## 2019-05-15 DIAGNOSIS — R0789 Other chest pain: Secondary | ICD-10-CM | POA: Diagnosis not present

## 2019-05-15 DIAGNOSIS — E119 Type 2 diabetes mellitus without complications: Secondary | ICD-10-CM | POA: Diagnosis not present

## 2019-05-15 DIAGNOSIS — Z8673 Personal history of transient ischemic attack (TIA), and cerebral infarction without residual deficits: Secondary | ICD-10-CM

## 2019-05-15 DIAGNOSIS — G459 Transient cerebral ischemic attack, unspecified: Secondary | ICD-10-CM

## 2019-05-15 NOTE — Patient Instructions (Signed)
Medication Instructions:  The current medical regimen is effective;  continue present plan and medications.  *If you need a refill on your cardiac medications before your next appointment, please call your pharmacy*   Lab Work: TSH  (you may come to our office, no appointment needed for our lab)  If you have labs (blood work) drawn today and your tests are completely normal, you will receive your results only by: Marland Kitchen MyChart Message (if you have MyChart) OR . A paper copy in the mail If you have any lab test that is abnormal or we need to change your treatment, we will call you to review the results.   Testing/Procedures: Your physician has requested that you have a lexiscan myoview. A cardiac stress test is a cardiological test that measures the heart's ability to respond to external stress in a controlled clinical environment. The stress response is induced by intravenous pharmacological stimulation.    Follow-Up: At [facility], you and your health needs are our priority.  As part of our continuing mission to provide you with exceptional heart care, we have created designated Provider Care Teams.  These Care Teams include your primary Cardiologist (physician) and Advanced Practice Providers (APPs -  Physician Assistants and Nurse Practitioners) who all work together to provide you with the care you need, when you need it.  We recommend signing up for the patient portal called "MyChart".  Sign up information is provided on this After Visit Summary.  MyChart is used to connect with patients for Virtual Visits (Telemedicine).  Patients are able to view lab/test results, encounter notes, upcoming appointments, etc.  Non-urgent messages can be sent to your provider as well.   To learn more about what you can do with MyChart, go to [email or web address].    Your next appointment:   If testing above comes back normal- you can follow up with Dr.Hilty in one year. We will be in contact  regarding the appointments.    Other Instructions Nursing Visit will be set up.

## 2019-05-15 NOTE — Telephone Encounter (Signed)
Contacted patient, discussed AVS and instructions.   Patient aware that scheduling will call her to schedule testing.

## 2019-05-15 NOTE — Progress Notes (Signed)
Virtual Visit via Telephone Note   This visit type was conducted due to national recommendations for restrictions regarding the COVID-19 Pandemic (e.g. social distancing) in an effort to limit this patient's exposure and mitigate transmission in our community.  Due to her co-morbid illnesses, this patient is at least at moderate risk for complications without adequate follow up.  This format is felt to be most appropriate for this patient at this time.  The patient did not have access to video technology/had technical difficulties with video requiring transitioning to audio format only (telephone).  All issues noted in this document were discussed and addressed.  No physical exam could be performed with this format.  Please refer to the patient's chart for her  consent to telehealth for [facility].   The patient was identified using 2 identifiers.  Date:  05/17/2019   ID:  Sherry [name], DOB 1950-04-19, MRN [id number]  Patient Location: Home Provider Location: Home  PCP:  Darreld Mclean, MD  Cardiologist:  Pixie Casino, MD Electrophysiologist:  None   Evaluation Performed:  Follow-Up Visit  Chief Complaint:  Chest pain  History of Present Illness:    Sherry [name] is a 69 y.o. female with past medical history of cerebral aneurysm, recurrent TIA, DM 2, HLD, HTN, questionable mitral valve prolapse, GERD, and a history of hiatal hernia. Myoview in 2016 was negative for ischemia.  Echocardiogram in March 2017 showed EF 60 to 65%, normal LV thickness, no regional wall motion abnormality, no diagnostic mitral valve prolapse was noted. Repeat Myoview obtained on 09/07/2016 showed EF 68%, low risk Myoview was normal perfusion.  Patient was last seen by Dr. Debara Pickett as part of preoperative clearance on 11/16/2017 prior to her knee surgery.  Patient presents today for virtual visit.  For the past several weeks, he has been having significant fatigue and chest tightness.  The symptom  does not have clear correlation with exertion.  She denies any lower extremity edema, orthopnea or PND.  Recent lab work has been reviewed and that there was no obvious secondary causes.  I did recommend a repeat a TSH to make sure her thyroid is normal.  Otherwise I recommend a Myoview for her chest discomfort.  The patient does not have symptoms concerning for COVID-19 infection (fever, chills, cough, or new shortness of breath).    Past Medical History:  Diagnosis Date  . Asthma   . Cerebral aneurysm   . Complication of anesthesia HYPOTENSION INTRAOP W/ HYSTERECTOMY IN 1987--  DID OK W/ TOTAL KNEE IN 2008  . Diabetes mellitus   . DJD (degenerative joint disease)   . Dyslipidemia   . GERD (gastroesophageal reflux disease)   . H/O hiatal hernia   . History of CHF (congestive heart failure) 2010-  RESOLVED  . History of peptic ulcer YRS AGO  . Hypertension   . Meniere's disease   . Mitral valve prolapse occasional palpitations w/ chest discomfort  . OA (osteoarthritis)   . Rotator cuff tear, left   . Stroke [facility])    Past Surgical History:  Procedure Laterality Date  . BUNIONECTOMY  2007   LEFT FOOT  . IR ANGIO INTRA EXTRACRAN SEL COM CAROTID INNOMINATE BILAT MOD SED  03/22/2019  . IR ANGIO VERTEBRAL SEL SUBCLAVIAN INNOMINATE UNI L MOD SED  03/22/2019  . IR ANGIO VERTEBRAL SEL VERTEBRAL UNI R MOD SED  03/22/2019  . IR US GUIDE VASC ACCESS RIGHT  03/22/2019  . [place]  ROTATOR CUFF REPAIR  . SHOULDER ARTHROSCOPY  09/20/2011   Procedure: ARTHROSCOPY SHOULDER;  Surgeon: Johnn Hai, MD;  Location[facility];  Service: Orthopedics;  Laterality: Left;  SAD AND MINI OPEN ROTATOR CUFF REPAIR    . TOTAL KNEE ARTHROPLASTY  09-12-2006  [facility]   RIGHT KNEE  . TOTAL KNEE ARTHROPLASTY Left 11/30/2017   Procedure: LEFT TOTAL KNEE REPLACEMENT;  Surgeon: [name], MD;  Location: [place];  Service: Orthopedics;  Laterality: Left;  . TUBAL LIGATION  1976    . TYMPANOPLASTY  1990;   1980  . VAGINAL HYSTERECTOMY  1987     Current Meds  Medication Sig  . blood glucose meter kit and supplies KIT Dispense based on patient and insurance preference. Use up to four times daily as directed. (FOR ICD-9 250.00, 250.01). Pt uses once a day  . cetirizine (ZYRTEC) 10 MG tablet Take 1 tablet (10 mg total) by mouth daily.  . clopidogrel (PLAVIX) 75 MG tablet Take 1 tablet (75 mg total) by mouth daily.  Marland Kitchen docusate sodium (COLACE) 100 MG capsule Take 1 capsule (100 mg total) by mouth daily.  . famotidine (PEPCID) 10 MG tablet Take 10 mg by mouth daily.  . fluticasone (FLOVENT HFA) 220 MCG/ACT inhaler Inhale 1 puff into the lungs 2 (two) times daily. May increase to 2 puffs if needed  . furosemide (LASIX) 20 MG tablet Take 1 tablet (20 mg total) by mouth daily.  Marland Kitchen gabapentin (NEURONTIN) 300 MG capsule TAKE 2 CAPSULES BY MOUTH TWO TIMES DAILY *NEED OFFICE VISIT*  . glucose blood (ACCU-CHEK AVIVA PLUS) test strip Use as instructed  . ipratropium (ATROVENT) 0.03 % nasal spray PLACE 2 SPRAYS INTO THE NOSE 4 (FOUR) TIMES DAILY.  Marland Kitchen JARDIANCE 10 MG TABS tablet TAKE 1 TABLET BY MOUTH DAILY  . Lancets Misc. (ACCU-CHEK SOFTCLIX LANCET DEV) KIT USE AS DIRECTED  . lisinopril (ZESTRIL) 20 MG tablet TAKE 1 TABLET BY MOUTH ONCE DAILY  . metFORMIN (GLUCOPHAGE) 500 MG tablet TAKE 2 TABLETS (1,000 MG TOTAL) BY MOUTH 2 (TWO) TIMES DAILY.  . methocarbamol (ROBAXIN) 500 MG tablet Take 1 tablet (500 mg total) by mouth 3 (three) times daily.  . metoprolol tartrate (LOPRESSOR) 25 MG tablet TAKE 1 TABLET BY MOUTH TWICE DAILY  . Multiple Vitamins-Minerals (MULTIVITAMIN PO) Take 2 tablets by mouth daily.  Marland Kitchen oxyCODONE-acetaminophen (PERCOCET/ROXICET) 5-325 MG tablet Take 0.5 tablets by mouth every 4 (four) hours as needed for severe pain (pain).  . QUEtiapine (SEROQUEL) 100 MG tablet TAKE 1/2 TABLET (50 MG TOTAL) BY MOUTH AT BEDTIME.  Marland Kitchen sertraline (ZOLOFT) 50 MG tablet TAKE 1/2 TABLET  (25 MG TOTAL) BY MOUTH AT BEDTIME. *NEED OFFICE VISIT*  . TRADJENTA 5 MG TABS tablet TAKE 1/2 TABLET BY MOUTH DAILY *NEED OFFICE VISIT*     Allergies:   Influenza vaccines, Demerol, Nsaids, Prednisone, Tramadol, Codeine, and Mango flavor [flavoring agent]   Social History   Tobacco Use  . Smoking status: Never Smoker  . Smokeless tobacco: Never Used  Substance Use Topics  . Alcohol use: No  . Drug use: No     Family Hx: The patient's family history includes Aneurysm in her mother; Bipolar disorder in her son; Breast cancer in her maternal aunt, maternal aunt, maternal aunt, maternal grandmother, and mother; Congestive Heart Failure in her father; Diabetes in her father and mother; Heart attack in her sister; Heart disease in her mother and sister; Hypertension in her father and sister; Kidney disease in  her mother; Lymphoma in her father; Other in her brother and son; Ovarian cancer in her mother; Stroke in her sister; Thyroid disease in her brother, mother, paternal grandmother, and sister.  ROS:   Please see the history of present illness.     All other systems reviewed and are negative.   Prior CV studies:   The following studies were reviewed today:  Echo 04/15/2015 LV EF: 60% -  65%   -------------------------------------------------------------------  Indications:   TIA 435.9.   -------------------------------------------------------------------  History:  PMH:  Congestive heart failure. Stroke. Risk factors:  Hypertension. Diabetes mellitus. Dyslipidemia.   -------------------------------------------------------------------  Study Conclusions   - Left ventricle: The cavity size was normal. Wall thickness was  normal. Systolic function was normal. The estimated ejection  fraction was in the range of 60% to 65%. Wall motion was normal;  there were no regional wall motion abnormalities.   Labs/Other Tests and Data Reviewed:    EKG:  An ECG dated  11/16/2017 was personally reviewed today and demonstrated:  Normal sinus rhythm with T wave inversion in V1 V2  Recent Labs: 04/16/2019: ALT 11; BUN 16; Creatinine, Ser 0.66; Hemoglobin 12.6; Platelets 251.0; Potassium 4.0; Sodium 140 05/16/2019: TSH 1.320   Recent Lipid Panel Lab Results  Component Value Date/Time   CHOL 138 04/16/2019 03:56 PM   TRIG 91.0 04/16/2019 03:56 PM   HDL 63.80 04/16/2019 03:56 PM   CHOLHDL 2 04/16/2019 03:56 PM   LDLCALC 56 04/16/2019 03:56 PM    Wt Readings from Last 3 Encounters:  05/15/19 184 lb 3.2 oz (83.6 kg)  04/16/19 182 lb (82.6 kg)  03/22/19 175 lb (79.4 kg)     Objective:    Vital Signs:  Ht 5' 1"  (1.549 m)   Wt 184 lb 3.2 oz (83.6 kg)   BMI 34.80 kg/m     VITAL SIGNS:  reviewed  ASSESSMENT & PLAN:    1. Chest pain: Symptom does not have clear correlation with physical exertion.  It is accompanied by significant fatigue.  I recommended TSH and Myoview  2. Hypertension: Continue on current therapy  3. Hyperlipidemia: Despite prior history of hyperlipidemia, I do not see any cholesterol medication under her medication list.  Recent lipid panel obtained in March 2021 showed very well-controlled cholesterol.  4. DM2: Managed by primary care provider  5. History of TIA: on Plavix  COVID-19 Education: The signs and symptoms of COVID-19 were discussed with the patient and how to seek care for testing (follow up with PCP or arrange E-visit).  The importance of social distancing was discussed today.  Time:   Today, I have spent 16 minutes with the patient with telehealth technology discussing the above problems.     Medication Adjustments/Labs and Tests Ordered: Current medicines are reviewed at length with the patient today.  Concerns regarding medicines are outlined above.   Tests Ordered: Orders Placed This Encounter  Procedures  . TSH  . MYOCARDIAL PERFUSION IMAGING    Medication Changes: No orders of the defined types were  placed in this encounter.   Follow Up:  Either In Person or Virtual in 6 month(s)  Signed, Almyra Deforest, [place]  05/17/2019 11:53 PM    [facility]

## 2019-05-16 ENCOUNTER — Other Ambulatory Visit: Payer: Self-pay

## 2019-05-16 ENCOUNTER — Encounter: Payer: Self-pay | Admitting: Physician Assistant

## 2019-05-16 ENCOUNTER — Other Ambulatory Visit (INDEPENDENT_AMBULATORY_CARE_PROVIDER_SITE_OTHER): Payer: Medicare Other | Admitting: Physician Assistant

## 2019-05-16 ENCOUNTER — Telehealth: Payer: Self-pay | Admitting: Physician Assistant

## 2019-05-16 DIAGNOSIS — R0789 Other chest pain: Secondary | ICD-10-CM

## 2019-05-16 DIAGNOSIS — I10 Essential (primary) hypertension: Secondary | ICD-10-CM | POA: Diagnosis not present

## 2019-05-16 DIAGNOSIS — E119 Type 2 diabetes mellitus without complications: Secondary | ICD-10-CM | POA: Diagnosis not present

## 2019-05-16 NOTE — Telephone Encounter (Signed)
I called to schedule a nurse visit for this patient per 05/15/19 staff message.  Patient states she is having "flutter" and is not feeling well.  She is scheduled to come in today 05/16/19 at 1:00pm.  Patient wants to know if she should take all of her medications prior to her visit today.

## 2019-05-16 NOTE — Telephone Encounter (Signed)
I spoke with patient. She reports flutter feeling in chest this morning.  She has been having this off and on and it is not new for her. Has not been checking BP/heart rate but did check earlier today and it was 126/64 and heart rate 50-55.  No other readings available. She feels tired. She checked BP/heart rate while on phone with me and it is 165/76, 67.  I told her to take medications as scheduled. She will come to office for nurse visit at 1 PM today.

## 2019-05-17 LAB — TSH: TSH: 1.32 u[IU]/mL (ref 0.450–4.500)

## 2019-05-17 NOTE — Progress Notes (Signed)
Normal thyroid function

## 2019-05-18 NOTE — Progress Notes (Signed)
Patient presented today for nursing visit.  Blood pressure obtained which was normal.  EKG was also obtained which did not show any significant changes to explain her recent symptoms.  Previous plan is unchanged to proceed with Myoview.  EKG has been reviewed personally by myself.  Result was discussed with the patient who was agreeable with the current plan.

## 2019-05-23 ENCOUNTER — Telehealth: Payer: Self-pay | Admitting: Family Medicine

## 2019-05-23 NOTE — Chronic Care Management (AMB) (Signed)
  Chronic Care Management   Note  05/23/2019 Name: NISHIKA BACHARACH MRN: [id number] DOB: 1950/09/13  Sherry [name] [name] is a 69 y.o. year old female who is a primary care patient of Copland, Gay Filler, MD. I reached out to [name] by phone today in response to a referral sent by Ms. Rosendo Gros Alfieri's PCP, Copland, Gay Filler, MD.   [name] was given information about Chronic Care Management services today including:  1. CCM service includes personalized support from designated clinical staff supervised by her physician, including individualized plan of care and coordination with other care providers 2. 24/7 contact phone numbers for assistance for urgent and routine care needs. 3. Service will only be billed when office clinical staff spend 20 minutes or more in a month to coordinate care. 4. Only one practitioner may furnish and bill the service in a calendar month. 5. The patient may stop CCM services at any time (effective at the end of the month) by phone call to the office staff.   Patient agreed to services and verbal consent obtained.   Follow up plan:   Raynicia Dukes [employer] Scheduler

## 2019-05-24 ENCOUNTER — Inpatient Hospital Stay (HOSPITAL_COMMUNITY): Admission: RE | Admit: 2019-05-24 | Payer: Medicare Other | Source: Ambulatory Visit

## 2019-05-25 ENCOUNTER — Telehealth (HOSPITAL_COMMUNITY): Payer: Self-pay

## 2019-05-25 NOTE — Telephone Encounter (Signed)
Encounter complete. 

## 2019-05-30 ENCOUNTER — Ambulatory Visit (HOSPITAL_COMMUNITY)
Admission: RE | Admit: 2019-05-30 | Discharge: 2019-05-30 | Disposition: A | Discharge status: Home / Self Care | Payer: Medicare Other | Source: Ambulatory Visit | Attending: Cardiology | Admitting: Cardiology

## 2019-05-30 ENCOUNTER — Other Ambulatory Visit: Payer: Self-pay

## 2019-05-30 DIAGNOSIS — R0789 Other chest pain: Secondary | ICD-10-CM | POA: Diagnosis not present

## 2019-05-30 LAB — MYOCARDIAL PERFUSION IMAGING
LV dias vol: 74 mL (ref 46–106)
LV sys vol: 30 mL
Peak HR: 89 {beats}/min
Rest HR: 60 {beats}/min
SDS: 3
SRS: 2
SSS: 5
TID: 1.21

## 2019-05-30 MED ORDER — REGADENOSON 0.4 MG/5ML IV SOLN
0.4000 mg | Freq: Once | INTRAVENOUS | Status: AC
  Administered 2019-05-30: 0.4 mg via INTRAVENOUS

## 2019-05-30 MED ORDER — TECHNETIUM TC 99M TETROFOSMIN IV KIT
10.8000 | PACK | Freq: Once | INTRAVENOUS | Status: AC | PRN
  Administered 2019-05-30: 10.8 via INTRAVENOUS
  Filled 2019-05-30: qty 11

## 2019-05-30 MED ORDER — TECHNETIUM TC 99M TETROFOSMIN IV KIT
32.0000 | PACK | Freq: Once | INTRAVENOUS | Status: AC | PRN
  Administered 2019-05-30: 32 via INTRAVENOUS
  Filled 2019-05-30: qty 32

## 2019-05-31 NOTE — Progress Notes (Signed)
Normal stress test result, normal pumping function

## 2019-06-04 ENCOUNTER — Ambulatory Visit: Payer: Medicare Other

## 2019-06-05 ENCOUNTER — Other Ambulatory Visit: Payer: Self-pay | Admitting: Family Medicine

## 2019-06-05 MED FILL — TRADJENTA 5 MG TABLET: 5 | 30 days supply | Qty: 15 | Fill #1

## 2019-06-05 MED FILL — CLOPIDOGREL 75 MG TABLET: 75 | 30 days supply | Qty: 30 | Fill #1

## 2019-06-05 MED FILL — GABAPENTIN 300 MG CAPSULE: 300 | 30 days supply | Qty: 120 | Fill #1

## 2019-06-05 MED FILL — SERTRALINE HCL 50 MG TABS: 50 | 30 days supply | Qty: 15 | Fill #1

## 2019-06-05 MED FILL — QUETIAPINE FUMARATE 100 MG: 100 | 30 days supply | Qty: 15 | Fill #0

## 2019-06-05 MED FILL — FUROSEMIDE 20 MG TAB: 20 | 90 days supply | Qty: 90 | Fill #1

## 2019-06-18 ENCOUNTER — Other Ambulatory Visit: Payer: Self-pay

## 2019-06-18 DIAGNOSIS — G459 Transient cerebral ischemic attack, unspecified: Secondary | ICD-10-CM

## 2019-06-18 DIAGNOSIS — E785 Hyperlipidemia, unspecified: Secondary | ICD-10-CM

## 2019-06-18 DIAGNOSIS — E118 Type 2 diabetes mellitus with unspecified complications: Secondary | ICD-10-CM

## 2019-06-21 ENCOUNTER — Other Ambulatory Visit: Payer: Self-pay

## 2019-06-21 ENCOUNTER — Ambulatory Visit: Payer: Medicare Other | Admitting: Pharmacist

## 2019-06-21 DIAGNOSIS — G459 Transient cerebral ischemic attack, unspecified: Secondary | ICD-10-CM

## 2019-06-21 DIAGNOSIS — F32A Depression, unspecified: Secondary | ICD-10-CM

## 2019-06-21 DIAGNOSIS — I10 Essential (primary) hypertension: Secondary | ICD-10-CM

## 2019-06-21 DIAGNOSIS — E785 Hyperlipidemia, unspecified: Secondary | ICD-10-CM

## 2019-06-21 DIAGNOSIS — E118 Type 2 diabetes mellitus with unspecified complications: Secondary | ICD-10-CM

## 2019-06-21 NOTE — Chronic Care Management (AMB) (Signed)
Chronic Care Management Pharmacy  Name: Sherry [name]  MRN: [id number] DOB: 03/11/1950   Chief Complaint/ HPI  Sherry [name],  69 y.o. , female presents for their Initial CCM visit with the clinical pharmacist via telephone due to COVID-19 Pandemic.  PCP : Darreld Mclean, MD  Their chronic conditions include: Asthma, Diabetes, Hypertension, Hx of Heart Failure, Hyperlipidemia/Hx of TIA, Depression, Pain  Office Visits: 04/16/19: Visit w/ Dr. Lorelei Pont - Chronic Right should pain. Small supply of hydrocodone given. Labs ordered (cmp, cbc, a1c, lipid). Referral to orthopedic surgery  04/12/19: Medicare Annual Wellness Exam w/ Naaman Plummer, RN - Goal updated to increase physical activity.   Consult Visit: None in last 6 months.   Medications: Outpatient Encounter Medications as of 06/21/2019  Medication Sig Note  . blood glucose meter kit and supplies KIT Dispense based on patient and insurance preference. Use up to four times daily as directed. (FOR ICD-9 250.00, 250.01). Pt uses once a Flonnie Wierman   . cetirizine (ZYRTEC) 10 MG tablet Take 1 tablet (10 mg total) by mouth daily.   . clopidogrel (PLAVIX) 75 MG tablet Take 1 tablet (75 mg total) by mouth daily.   . famotidine (PEPCID) 10 MG tablet Take 10 mg by mouth daily.   . fluticasone (FLOVENT HFA) 220 MCG/ACT inhaler Inhale 1 puff into the lungs 2 (two) times daily. May increase to 2 puffs if needed   . furosemide (LASIX) 20 MG tablet Take 1 tablet (20 mg total) by mouth daily.   Marland Kitchen gabapentin (NEURONTIN) 300 MG capsule TAKE 2 CAPSULES BY MOUTH TWO TIMES DAILY *NEED OFFICE VISIT*   . glucose blood (ACCU-CHEK AVIVA PLUS) test strip Use as instructed   . JARDIANCE 10 MG TABS tablet TAKE 1 TABLET BY MOUTH DAILY 06/21/2019: Patient taking 1/2 tab due to 1 tab causing too much urination  . Lancets Misc. (ACCU-CHEK SOFTCLIX LANCET DEV) KIT USE AS DIRECTED   . Multiple Vitamins-Minerals (MULTIVITAMIN PO) Take 2 tablets by mouth daily.    Marland Kitchen oxyCODONE-acetaminophen (PERCOCET/ROXICET) 5-325 MG tablet Take 0.5 tablets by mouth every 4 (four) hours as needed for severe pain (pain).   . QUEtiapine (SEROQUEL) 100 MG tablet TAKE 1/2 TABLET (50 MG TOTAL) BY MOUTH AT BEDTIME. *NEED OFFICE VISIT*   . sertraline (ZOLOFT) 50 MG tablet TAKE 1/2 TABLET (25 MG TOTAL) BY MOUTH AT BEDTIME. *NEED OFFICE VISIT*   . TRADJENTA 5 MG TABS tablet TAKE 1/2 TABLET BY MOUTH DAILY *NEED OFFICE VISIT*   . [DISCONTINUED] lisinopril (ZESTRIL) 20 MG tablet TAKE 1 TABLET BY MOUTH ONCE DAILY   . [DISCONTINUED] metFORMIN (GLUCOPHAGE) 500 MG tablet TAKE 2 TABLETS (1,000 MG TOTAL) BY MOUTH 2 (TWO) TIMES DAILY.   . [DISCONTINUED] metoprolol tartrate (LOPRESSOR) 25 MG tablet TAKE 1 TABLET BY MOUTH TWICE DAILY   . docusate sodium (COLACE) 100 MG capsule Take 1 capsule (100 mg total) by mouth daily. (Patient not taking: Reported on 06/21/2019)   . ipratropium (ATROVENT) 0.03 % nasal spray PLACE 2 SPRAYS INTO THE NOSE 4 (FOUR) TIMES DAILY. (Patient not taking: Reported on 06/21/2019)   . methocarbamol (ROBAXIN) 500 MG tablet Take 1 tablet (500 mg total) by mouth 3 (three) times daily. (Patient not taking: Reported on 06/21/2019)    No facility-administered encounter medications on file as of 06/21/2019.   SDOH Screenings   Alcohol Screen:   . Last Alcohol Screening Score (AUDIT):   Depression (PHQ2-9): Low Risk   . PHQ-2 Score: 0  Financial Resource Strain:  Low Risk   . Difficulty of Paying Living Expenses: Not hard at all  Food Insecurity:   . Worried About Charity fundraiser in the Last Year:   . [employer] of Food in the Last Year:   Housing: Long   . Last Housing Risk Score: 0  Physical Activity:   . Days of Exercise per Week:   . Minutes of Exercise per Session:   Social Connections:   . Frequency of Communication with Friends and Family:   . Frequency of Social Gatherings with Friends and Family:   . Attends Religious Services:   . Active Member of  Clubs or Organizations:   . Attends Archivist Meetings:   Marland Kitchen Marital Status:   Stress:   . Feeling of Stress :   Tobacco Use: Low Risk   . Smoking Tobacco Use: Never Smoker  . Smokeless Tobacco Use: Never Used  Transportation Needs: No Transportation Needs  . Lack of Transportation (Medical): No  . Lack of Transportation (Non-Medical): No     Current Diagnosis/Assessment:  Goals Addressed            This Visit's Progress   . Chronic Care Management Pharmacy Care Plan       CARE PLAN ENTRY  Current Barriers:  . Chronic Disease Management support, education, and care coordination needs related to Asthma, Diabetes, Hypertension, Hx of Heart Failure, Hyperlipidemia/Hx of TIA, Depression, Pain   Hypertension . Pharmacist Clinical Goal(s): o Over the next 90 days, patient will work with PharmD and providers to achieve BP goal <140/90 . Current regimen:  o  Lisinopril 40m daily, metoprolol 247mtwice daily . Interventions: o Requested patient to check her BP 3-4 times per week and record . Patient self care activities - Over the next 90 days, patient will: o Check BP 3-4 times per week, document, and provide at future appointments o Ensure daily salt intake < 2300 mg/Omarian Jaquith  Hyperlipidemia . Pharmacist Clinical Goal(s): o Over the next 90 days, patient will work with PharmD and providers to maintain LDL goal <70 . Current regimen:  o Diet and exercise management   . Interventions: o Consider initiation of rosuvastatin 1057maily to help reduce risk of future TIA, stroke, or heart attack . Patient self care activities - Over the next 90 days, patient will: o Maintain LDL goal <70  Diabetes . Pharmacist Clinical Goal(s): o Over the next 90 days, patient will work with PharmD and providers to maintain A1c goal <7% . Current regimen:  o Tradjenta 5mg70m2 tab daily, Jardiance 10mg59m tab daily, metformin 500mg 16mwice daily . Interventions: o Discussed possibility  of deprescribing noting patient a1c has ben <6.5% at last two lab draws . Patient self care activities - Over the next 90 days, patient will: o Maintain diabetes medication regimen  Depression . Pharmacist Clinical Goal(s) o Over the next 90 days, patient will work with PharmD and providers to reduce symptoms of depression and simplify medication regimen . Current regimen:  o Quetiapine 100mg 15mab daily HS, sertraline 50mg 1/43mb daily . Interventions: o Collaboration with provider regarding medication management (quetiapine 25mg and26mtraline 25mg dail52m replace current regimen) . Patient self care activities - Over the next 90 days, patient will: o Maintain depression medication reigmen  Medication management . Pharmacist Clinical Goal(s): o Over the next 90 days, patient will work with PharmD and providers to maintain optimal medication adherence . Current pharmacy: [facility] . Interventions o Comprehensive medication review performed. o Continue current medication management strategy . Patient self care activities - Over the next 90 days, patient will: o Focus on medication adherence by filling medications appropriately  o Take medications as prescribed o Report any questions or concerns to PharmD and/or provider(s)  Initial goal documentation       Social Hx Widowed. Husband died in 04/30/15 due to cardiac arrest (significant situation).  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 Has 2 sons and 2 daughters. Has 1 son that is deceased (had two children). Has 17 grands. 12-13 great grandchildren.  Asthma / Tobacco   Last spirometry score: 08/06/2014 FVC: 1.9 80% FEV1: 1.7 91% FEV1/FVC: 90%  Eosinophil count:   Lab Results  Component  Value Date/Time   EOSPCT 3 03/22/2019 06:41 AM  %                               Eos (Absolute):  Lab Results  Component Value Date/Time   EOSABS 0.2 03/22/2019 06:41 AM    Tobacco Status:  Social History   Tobacco Use  Smoking Status Never Smoker  Smokeless Tobacco Never Used    Patient has failed these meds in past: None noted  Patient is currently controlled on the following medications: Flovent 270mg 2 puffs twice daily Using maintenance inhaler regularly? No Frequency of rescue inhaler use:  infrequently (hasn't used in a couple of months)  Plan -Continue current medications  Diabetes   Recent Relevant Labs: Lab Results  Component Value Date/Time   HGBA1C 6.2 04/16/2019 03:56 PM   HGBA1C 6.1 02/15/2018 10:24 AM    A1c goal <7%  Checking BG: Rarely  Recent FBG Readings: "Never over 100 or 120" Patient is currently controlled on the following medications: Tradjenta 559m1/2 tab daily, Jardiance 1058m/2 tab daily (states she will be in the bathroom too much if she takes a whole tablet), metformin 500m36m twice daily  Jardiance: States she has taken a half tab about a week after being prescribed. "Not over $10"  Last diabetic Eye exam: No results found for: HMDIABEYEEXA (Pt reports she was last seen by Dr. PeteAlois Cliche003-22-21st diabetic Foot exam: No results found for: HMDIABFOOTEX (04/16/19 w/ Dr. CoplLorelei PontI've lost a lot of weight"  I really don't want to change anything. I don't want to end up in the hospital  We discussed: Possibility of deprescribing for her DM regimen, but patient is happy with her regimen, and feels uneasy about changing  Plan -Continue current medications   Future Plan -Consider D/C Tradjenta if a1c remains <6.5%  Hypothyroidism Screening   TSH  Date Value Ref Range Status  05/16/2019 1.320 0.450 - 4.500 uIU/mL Final     Patient has failed these meds in past: None noted  Patient is currently controlled on the following  medications: None  Patient concerned about thyroid function. Reviewed labs.    Hypertension   BP today is:  Unable to assess due to phone visit   Office blood pressures are  BP Readings from Last 3 Encounters:  04/16/19 (!) 144/88  03/22/19 (!) 141/75  09/26/18 118/80    Patient has failed these meds in the past: None noted  Patient is currently uncontrolled on the following medications: lisinopril 19m daily, metoprolol 223mtwice daily  Patient checks BP at home infrequently  Patient home BP readings are ranging: 130/70s. Feels like her HR is in the 50s when she is really tired  Wonders about flutter and low heart rate  Plan -Check blood pressure and heart rate 3-4 times per week and record -Continue current medications   Hx of Heart Failure   Last ejection fraction: 04/25/2015 NYHA Class: I (no actitivty limitation) AHA HF Stage: A (HF risk factors present)  Patient has failed these meds in past: None noted  Patient is currently controlled on the following medications: furosemide 2060maiy  Does get lower extremity edema by the end of the work [employer]. Does eat fried foods or salt  Plan -Continue current medications   Hyperlipidemia/Hx of TIA   Lipid Panel     Component Value Date/Time   CHOL 138 04/16/2019 1556   TRIG 91.0 04/16/2019 1556   HDL 63.80 04/16/2019 1556   CHOLHDL 2 04/16/2019 1556   VLDL 18.2 04/16/2019 1556   LDLCALC 56 04/16/2019 1556    LDL <70  The 10-year ASCVD risk score (GoMikey Bussing Jr., et al., 2013) is: 21.9%   Values used to calculate the score:     Age: [age]ars     Sex: Female     Is Non-Hispanic African American: Yes     Diabetic: Yes     Tobacco smoker: No     Systolic Blood Pressure: 144681Hg     Is BP treated: Yes     HDL Cholesterol: 63.8 mg/dL     Total Cholesterol: 138 mg/dL   Patient has failed these meds in past: atorvastatin (muscle pain) Patient is currently controlled on the following medications: None  We  discussed:  Benefit of statin therapy noting her hx of TIA even though her LDL is at goal  Plan -Continue current medications   Future Plan -Consider starting rosuvastatin 23m48mily to reduce ASCVD risk  Depression    Patient has failed these meds in past: None noted  Patient is currently controlled on the following medications: quetiapine 100mg28m tab daily HS (pt is weaning herself off, but feels she needs it due to anger issues), sertraline 50mg 23mtab daily  She tried to go without one of them and it threw her off Quetiapine: Has 11 tabs remaining (44 Jalila Goodnough supply) Sertraline: Has 8 tabs remaining (16 Kortlynn Poust supply)  Patient would like to receive 3 month supply  PHQ2: 0  We discussed:  Getting her 25mg t27mof quetiapine and sertraline  Plan -Start new prescription of quetiapine 25mg da52mto replace quetiapine 100mg tab60m 1/4 tab daily -Start new prescription of sertraline 25mg dail20m replace sertraline 50mg 1/2 t21maily  Pain    Patient has failed these meds in past: None noted  Patient is currently controlled on the following medications: oxycodone 5-325mg as nee38m gabapentin 300mg 2 capsu98mtwice daily  Oxycodone: rarely uses Methocarbamol: "That's like taking a drink of water. That does nothing but upset my stomach"  Was being seen by Dr. Norris, but sVeverly Fellsshe can't be seen by them due to outstanding balance of $900 from a worker's comp case  Plan -Continue current medications   Meds to D/C from list  Methocarbamol (ineffective) Ipratropium (completed) Docusate (no longer needed; has diarrhea daily now)

## 2019-06-29 ENCOUNTER — Other Ambulatory Visit: Payer: Self-pay | Admitting: Family Medicine

## 2019-06-29 DIAGNOSIS — I10 Essential (primary) hypertension: Secondary | ICD-10-CM

## 2019-06-29 MED FILL — SERTRALINE HCL 50 MG TABS: 50 | 30 days supply | Qty: 15 | Fill #2

## 2019-06-29 MED FILL — METFORMIN HCL 500 MG TABS: 500 | 90 days supply | Qty: 360 | Fill #0

## 2019-06-29 MED FILL — TRADJENTA 5 MG TABLET: 5 | 30 days supply | Qty: 15 | Fill #2

## 2019-06-29 MED FILL — QUETIAPINE FUMARATE 100 MG: 100 | 30 days supply | Qty: 15 | Fill #1

## 2019-06-29 MED FILL — LISINOPRIL 20 MG TABS: 20 | 30 days supply | Qty: 30 | Fill #0

## 2019-06-29 MED FILL — METOPROLOL TARTRATE 25 MG T: 25 | 30 days supply | Qty: 60 | Fill #0

## 2019-06-29 MED FILL — CLOPIDOGREL 75 MG TABLET: 75 | 30 days supply | Qty: 30 | Fill #2

## 2019-06-29 MED FILL — GABAPENTIN 300 MG CAPSULE: 300 | 30 days supply | Qty: 120 | Fill #2

## 2019-07-04 NOTE — Patient Instructions (Signed)
Visit Information  Goals Addressed            This Visit's Progress   . Chronic Care Management Pharmacy Care Plan       CARE PLAN ENTRY  Current Barriers:  . Chronic Disease Management support, education, and care coordination needs related to Asthma, Diabetes, Hypertension, Hx of Heart Failure, Hyperlipidemia/Hx of TIA, Depression, Pain   Hypertension . Pharmacist Clinical Goal(s): o Over the next 90 days, patient will work with PharmD and providers to achieve BP goal <140/90 . Current regimen:  o  Lisinopril 20mg  daily, metoprolol 25mg  twice daily . Interventions: o Requested patient to check her BP 3-4 times per week and record . Patient self care activities - Over the next 90 days, patient will: o Check BP 3-4 times per week, document, and provide at future appointments o Ensure daily salt intake < 2300 mg/Sherry [name]  Hyperlipidemia . Pharmacist Clinical Goal(s): o Over the next 90 days, patient will work with PharmD and providers to maintain LDL goal <70 . Current regimen:  o Diet and exercise management   . Interventions: o Consider initiation of rosuvastatin 10mg  daily to help reduce risk of future TIA, stroke, or heart attack . Patient self care activities - Over the next 90 days, patient will: o Maintain LDL goal <70  Diabetes . Pharmacist Clinical Goal(s): o Over the next 90 days, patient will work with PharmD and providers to maintain A1c goal <7% . Current regimen:  o Tradjenta 5mg  1/2 tab daily, Jardiance 10mg  1/2 tab daily, metformin 500mg  #2 twice daily . Interventions: o Discussed possibility of deprescribing noting patient a1c has ben <6.5% at last two lab draws . Patient self care activities - Over the next 90 days, patient will: o Maintain diabetes medication regimen  Depression . Pharmacist Clinical Goal(s) o Over the next 90 days, patient will work with PharmD and providers to reduce symptoms of depression and simplify medication regimen . Current  regimen:  o Quetiapine 100mg  1/4 tab daily HS, sertraline 50mg  1/2 tab daily . Interventions: o Collaboration with provider regarding medication management (quetiapine 25mg  and sertraline 25mg  daily to replace current regimen) . Patient self care activities - Over the next 90 days, patient will: o Maintain depression medication reigmen  Medication management . Pharmacist Clinical Goal(s): o Over the next 90 days, patient will work with PharmD and providers to maintain optimal medication adherence . Current pharmacy: [facility] . Interventions o Comprehensive medication review performed. o Continue current medication management strategy . Patient self care activities - Over the next 90 days, patient will: o Focus on medication adherence by filling medications appropriately  o Take medications as prescribed o Report any questions or concerns to PharmD and/or provider(s)  Initial goal documentation        Sherry [name] was given information about Chronic Care Management services today including:  1. CCM service includes personalized support from designated clinical staff supervised by her physician, including individualized plan of care and coordination with other care providers 2. 24/7 contact phone numbers for assistance for urgent and routine care needs. 3. Standard insurance, coinsurance, copays and deductibles apply for chronic care management only during months in which we provide at least 20 minutes of these services. Most insurances cover these services at 100%, however patients may be responsible for any copay, coinsurance and/or deductible if applicable. This service may help you avoid the need for more expensive face-to-face services. 4. Only one practitioner may furnish and bill the  service in a calendar month. 5. The patient may stop CCM services at any time (effective at the end of the month) by phone call to the office staff.  Patient agreed to services  and verbal consent obtained.   The patient verbalized understanding of instructions provided today and agreed to receive a mailed copy of patient instruction and/or educational materials. Telephone follow up appointment with pharmacy team member scheduled for: 09/27/2019  Melvenia Beam Kasidee Voisin, PharmD Clinical Pharmacist [facility] at [facility] [phone]    How to Take Your Blood Pressure You can take your blood pressure at home with a machine. You may need to check your blood pressure at home:  To check if you have high blood pressure (hypertension).  To check your blood pressure over time.  To make sure your blood pressure medicine is working. Supplies needed: You will need a blood pressure machine, or monitor. You can buy one at a drugstore or online. When choosing one:  Choose one with an arm cuff.  Choose one that wraps around your upper arm. Only one finger should fit between your arm and the cuff.  Do not choose one that measures your blood pressure from your wrist or finger. Your doctor can suggest a monitor. How to prepare Avoid these things for 30 minutes before checking your blood pressure:  Drinking caffeine.  Drinking alcohol.  Eating.  Smoking.  Exercising. Five minutes before checking your blood pressure:  Pee.  Sit in a dining chair. Avoid sitting in a soft couch or armchair.  Be quiet. Do not talk. How to take your blood pressure Follow the instructions that came with your machine. If you have a digital blood pressure monitor, these may be the instructions: 1. Sit up straight. 2. Place your feet on the floor. Do not cross your ankles or legs. 3. Rest your left arm at the level of your heart. You may rest it on a table, desk, or chair. 4. Pull up your shirt sleeve. 5. Wrap the blood pressure cuff around the upper part of your left arm. The cuff should be 1 inch (2.5 cm) above your elbow. It is best to wrap the cuff around bare  skin. 6. Fit the cuff snugly around your arm. You should be able to place only one finger between the cuff and your arm. 7. Put the cord inside the groove of your elbow. 8. Press the power button. 9. Sit quietly while the cuff fills with air and loses air. 10. Write down the numbers on the screen. 11. Wait 2-3 minutes and then repeat steps 1-10. What do the numbers mean? Two numbers make up your blood pressure. The first number is called systolic pressure. The second is called diastolic pressure. An example of a blood pressure reading is "120 over 80" (or 120/80). If you are an adult and do not have a medical condition, use this guide to find out if your blood pressure is normal: Normal  First number: below 120.  Second number: below 80. Elevated  First number: 120-129.  Second number: below 80. Hypertension stage 1  First number: 130-139.  Second number: 80-89. Hypertension stage 2  First number: 140 or above.  Second number: 22 or above. Your blood pressure is above normal even if only the top or bottom number is above normal. Follow these instructions at home:  Check your blood pressure as often as your doctor tells you to.  Take your monitor to your next doctor's appointment. Your  doctor will: ? Make sure you are using it correctly. ? Make sure it is working right.  Make sure you understand what your blood pressure numbers should be.  Tell your doctor if your medicines are causing side effects. Contact a doctor if:  Your blood pressure keeps being high. Get help right away if:  Your first blood pressure number is higher than 180.  Your second blood pressure number is higher than 120. This information is not intended to replace advice given to you by your health care provider. Make sure you discuss any questions you have with your health care provider. Document Revised: 01/07/2017 Document Reviewed: 07/04/2015 Elsevier Patient Education  2020 [place].  Blood Pressure Record Sheet To take your blood pressure, you will need a blood pressure machine. You can buy a blood pressure machine (blood pressure monitor) at your clinic, drug store, or online. When choosing one, consider:  An automatic monitor that has an arm cuff.  A cuff that wraps snugly around your upper arm. You should be able to fit only one finger between your arm and the cuff.  A device that stores blood pressure reading results.  Do not choose a monitor that measures your blood pressure from your wrist or finger. Follow your health care provider's instructions for how to take your blood pressure. To use this form:  Get one reading in the morning (a.m.) before you take any medicines.  Get one reading in the evening (p.m.) before supper.  Take at least 2 readings with each blood pressure check. This makes sure the results are correct. Wait 1-2 minutes between measurements.  Write down the results in the spaces on this form.  Repeat this once a week, or as told by your health care provider.  Make a follow-up appointment with your health care provider to discuss the results. Blood pressure log Date: _______________________  a.m. _____________________(1st reading) _____________________(2nd reading)  p.m. _____________________(1st reading) _____________________(2nd reading) Date: _______________________  a.m. _____________________(1st reading) _____________________(2nd reading)  p.m. _____________________(1st reading) _____________________(2nd reading) Date: _______________________  a.m. _____________________(1st reading) _____________________(2nd reading)  p.m. _____________________(1st reading) _____________________(2nd reading) Date: _______________________  a.m. _____________________(1st reading) _____________________(2nd reading)  p.m. _____________________(1st reading) _____________________(2nd reading) Date: _______________________  a.m.  _____________________(1st reading) _____________________(2nd reading)  p.m. _____________________(1st reading) _____________________(2nd reading) This information is not intended to replace advice given to you by your health care provider. Make sure you discuss any questions you have with your health care provider. Document Revised: 03/25/2017 Document Reviewed: 01/25/2017 Elsevier Patient Education  [place].

## 2019-07-05 ENCOUNTER — Telehealth: Payer: Self-pay | Admitting: Family Medicine

## 2019-07-05 DIAGNOSIS — F320 Major depressive disorder, single episode, mild: Secondary | ICD-10-CM

## 2019-07-05 MED ORDER — SERTRALINE HCL 25 MG PO TABS: 25.0000 mg | ORAL_TABLET | Freq: Every day | ORAL | 3 refills | Status: DC

## 2019-07-05 MED ORDER — QUETIAPINE FUMARATE 25 MG PO TABS: 25.0000 mg | ORAL_TABLET | Freq: Every day | ORAL | 3 refills | Status: DC

## 2019-07-05 MED FILL — QUETIAPINE 25 MG TABLET: 25 | 90 days supply | Qty: 90 | Fill #0

## 2019-07-05 MED FILL — SERTRALINE HCL 25 MG TABLET: 25 | 90 days supply | Qty: 90 | Fill #0

## 2019-07-05 NOTE — Telephone Encounter (Signed)
-----   Message from Felicity, [facility] sent at 07/04/2019  6:02 PM EDT ----- Regarding: Replacement Scripts, Same Dosage Hello Dr. Lorelei Pont!  I had the pleasure of seeing this patient and we discussed quite a few things. I am sending my note shortly for attestation. The most notable changes/recommendations/questions are as follows:   -Pt is currently taking quetiapine 100mg  1/4 tab daily HS (pt is weaning herself off, but feels she needs it due to anger issues), sertraline 50mg  1/2 tab daily  Her PHQ9 = 0  To simplify her regimen would you be willing to send in scripts to her pharmacy for quetiapine 25mg  daily and sertraline 25mg  daily?   Please let me know if I can assist with anything further!   Thanks,  De Blanch, PharmD Clinical Pharmacist [facility] at [facility] [phone]

## 2019-07-11 ENCOUNTER — Ambulatory Visit: Payer: Self-pay | Admitting: Pharmacist

## 2019-07-11 NOTE — Chronic Care Management (AMB) (Signed)
Patient called me and mentioned that at her blood pressure was low at an urgent follow up for her workers comp claim. She states her BP at this visit was 93/63 and she did feel lightheaded. She states she has felt lightheaded the last month.   She makes sure to not stand up suddenly.   Home BP 117 62 64  123 66 65  120 80 59 no meds night prior 113 67 66  129 73 58 severe pain in arm 115 64 59  117 68 66  140 73 68 pain 104 69 67 Average  119.7 69.1 63.5  Patient wonders if she is taking too much blood pressure medicine.  Assured her that her BP is at goal per the readings reported and they all seem to be higher than the one blood pressure seen at urgent care. Her last 2 clinic BP has been elevated. Interested to see what her BP is when she comes in tomorrow. Will have patient bring in her BP cuff to compare to clinic cuff at her appt tomorrow with Dr. Lorelei Pont.   If considering deprescribing of HTN regimen, would recommend tapering down metoprolol. She has a remote hx of heart failure, but her last ECHO in 2017 showed EF of 60-65%. Metoprolol is also a more likely contributor to lightheadedness than lisinopril. Pt would also benefit from lisinopril therapy noting her comorbidity with diabetes.

## 2019-07-12 ENCOUNTER — Ambulatory Visit (INDEPENDENT_AMBULATORY_CARE_PROVIDER_SITE_OTHER): Payer: Medicare Other | Admitting: Family Medicine

## 2019-07-12 ENCOUNTER — Other Ambulatory Visit: Payer: Self-pay

## 2019-07-12 ENCOUNTER — Encounter: Payer: Self-pay | Admitting: Family Medicine

## 2019-07-12 VITALS — BP 100/58 | HR 60 | Temp 97.8°F | Resp 16 | Ht 61.0 in | Wt 178.0 lb

## 2019-07-12 DIAGNOSIS — S4991XA Unspecified injury of right shoulder and upper arm, initial encounter: Secondary | ICD-10-CM | POA: Diagnosis not present

## 2019-07-12 DIAGNOSIS — I952 Hypotension due to drugs: Secondary | ICD-10-CM | POA: Diagnosis not present

## 2019-07-12 MED ORDER — HYDROCODONE-ACETAMINOPHEN 5-325 MG PO TABS: 0.5000 | ORAL_TABLET | Freq: Three times a day (TID) | ORAL | 0 refills | Status: AC | PRN

## 2019-07-12 MED FILL — HYDROCODON-APAP 5-325: 5-325 | 5 days supply | Qty: 15 | Fill #0

## 2019-07-12 NOTE — Progress Notes (Signed)
[facility] [address] [place], [place] [address] [phone]  Date:  07/12/2019   Name:  Sherry [name]   DOB:  1951/01/26   MRN:  [id number]  PCP:  Darreld Mclean, MD    Chief Complaint: Shoulder Pain (right shouldpain, injury at work)   History of Present Illness:  Sherry [name] is a 69 y.o. very pleasant female patient who presents with the following:  Patient here today with concern of shoulder injury History of TIA 2016, hypertension, intracranial vascular stenosis, hypothyroidism, diabetes with peripheral neuropathy, CHF, hyperlipidemia Her vascular issues are followed by Dr. 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   He is currently 86 months old and weighs about 33 pounds.  Recently Sherry [name] was moving him from his cot to his chair- She tripped on his GJ tube and pulled her right arm.  She was able to avoid dropping the child, but she had pretty severe shoulder pain that "felt like tearing" right away, and then then she developed aching in the shoulder and decreased ROM She went to [facility] and had a limited evaluation  She used a massage chair yesterday and this helped her some She has used tylenol and salanpas but continues to have quite a bit of pain She will use some methocambamol prn   She has had left shoulder surgery 3x in the past. She is not able to go back to her previous orthopedist office; she was surprised to find out that she owes a significant balance from a previous Worker's Comp. case.  She plans to consult with her Worker's Comp. Chief Executive Officer and see if she can resolve this issue  COVID-19 vaccine- she has not gotten this due to history of flu vaccine allergy She is not  allergic to eggs   Lab Results  Component Value Date   HGBA1C 6.2 04/16/2019   She has noted her BP running too low for about 6 months- this seems worse in the evening time In the AM her BP seems to be ok but can be lower in the evenings and she may feel lightheaded  She did a stress test in April which was ok  Nuclear stress EF: 60%.  The left ventricular ejection fraction is normal (55-65%).  The study is normal.  This is a low risk study.  There was no ST segment deviation noted during stress.   Low risk stress nuclear study with normal perfusion and normal left ventricular regional and global systolic function.   She uses lasix for her Meniere's disease  She is taking metoprolol 25 BID Lisinopril 20 daily  Patient Active Problem List   Diagnosis Date Noted  . Traumatic hematoma of left lower leg 09/26/2018  . S/P total knee arthroplasty, left 03/29/2017  . Spondylosis without myelopathy or radiculopathy, cervical region 03/29/2017  . Bilateral carpal tunnel syndrome 03/29/2017  . Acute asthma exacerbation 08/19/2016  . Cervicalgia 03/23/2016  . Vertigo 04/14/2015  . Anxiety state 01/22/2015  . HLD (hyperlipidemia) 01/22/2015  . Type 2 diabetes mellitus with circulatory disorder ([place]) 01/22/2015  . Intracranial vascular stenosis 01/22/2015  . Aneurysm ([place])   . Headache   . Essential hypertension 11/12/2014  . Dependent edema 11/12/2014  .  Peripheral neuropathy 11/12/2014  . Depression 11/12/2014  . Facial droop   . Chest pain   . TIA (transient ischemic attack) 11/11/2014  . Dyspnea 08/06/2014  . Hyperthyroidism 08/06/2014  . Meniere's disease 06/07/2014  . History of CHF (congestive heart failure) 06/07/2014  . Goiter 06/13/2013  . Obesity, unspecified 06/13/2013    Past Medical History:  Diagnosis Date  . Asthma   . Cerebral aneurysm   . Complication of anesthesia HYPOTENSION INTRAOP W/ HYSTERECTOMY IN 1987--  DID OK W/ TOTAL KNEE IN 2008  .  Diabetes mellitus   . DJD (degenerative joint disease)   . Dyslipidemia   . GERD (gastroesophageal reflux disease)   . H/O hiatal hernia   . History of CHF (congestive heart failure) 2010-  RESOLVED  . History of peptic ulcer YRS AGO  . Hypertension   . Meniere's disease   . Mitral valve prolapse occasional palpitations w/ chest discomfort  . OA (osteoarthritis)   . Rotator cuff tear, left   . Stroke [facility])     Past Surgical History:  Procedure Laterality Date  . BUNIONECTOMY  2007   LEFT FOOT  . IR ANGIO INTRA EXTRACRAN SEL COM CAROTID INNOMINATE BILAT MOD SED  03/22/2019  . IR ANGIO VERTEBRAL SEL SUBCLAVIAN INNOMINATE UNI L MOD SED  03/22/2019  . IR ANGIO VERTEBRAL SEL VERTEBRAL UNI R MOD SED  03/22/2019  . IR US GUIDE VASC ACCESS RIGHT  03/22/2019  . LEFT SHOULDER SURGERY  1997   ROTATOR CUFF REPAIR  . SHOULDER ARTHROSCOPY  09/20/2011   Procedure: ARTHROSCOPY SHOULDER;  Surgeon: Johnn Hai, MD;  Location: [facility];  Service: Orthopedics;  Laterality: Left;  SAD AND MINI OPEN ROTATOR CUFF REPAIR    . TOTAL KNEE ARTHROPLASTY  09-12-2006  [facility]   RIGHT KNEE  . TOTAL KNEE ARTHROPLASTY Left 11/30/2017   Procedure: LEFT TOTAL KNEE REPLACEMENT;  Surgeon: [name], MD;  Location: [facility];  Service: Orthopedics;  Laterality: Left;  . TUBAL LIGATION  1976  . TYMPANOPLASTY  1990;   1980  . VAGINAL HYSTERECTOMY  1987    Social History   Tobacco Use  . Smoking status: Never Smoker  . Smokeless tobacco: Never Used  Substance Use Topics  . Alcohol use: No  . Drug use: No    Family History  Problem Relation Age of Onset  . Heart disease Mother        in her 66s, heart attack  . Diabetes Mother   . Kidney disease Mother        dialysis  . Thyroid disease Mother   . Aneurysm Mother   . Ovarian cancer Mother   . Breast cancer Mother   . Thyroid disease Sister   . Heart disease Sister   . Lymphoma Father   . Diabetes Father   . Hypertension Father   .  Congestive Heart Failure Father   . Thyroid disease Brother   . Thyroid disease Paternal Grandmother   . Heart attack Sister        at age 75  . Stroke Sister   . Hypertension Sister   . Breast cancer Maternal Aunt   . Breast cancer Maternal Grandmother   . Breast cancer Maternal Aunt   . Breast cancer Maternal Aunt   . Other Brother        Musician accident  . Bipolar disorder Son   . Other Son        GSW  Allergies  Allergen Reactions  . Influenza Vaccines Anaphylaxis  . Demerol Other (See Comments)    SEIZURE  . Nsaids Other (See Comments)    NAPROXEN, IBUPROFEN, SKELAXIN---  CAUSES PALPITATIONS  . Prednisone Other (See Comments)    Caused her glucose to go up to 500 and went to ED  . Tramadol Other (See Comments)    Contraindicated with Victoza   . Codeine Itching  . Mango Flavor [Flavoring Agent] Itching    Medication list has been reviewed and updated.  Current Outpatient Medications on File Prior to Visit  Medication Sig Dispense Refill  . blood glucose meter kit and supplies KIT Dispense based on patient and insurance preference. Use up to four times daily as directed. (FOR ICD-9 250.00, 250.01). Pt uses once a day 1 each 0  . cetirizine (ZYRTEC) 10 MG tablet Take 1 tablet (10 mg total) by mouth daily. 90 tablet 3  . clopidogrel (PLAVIX) 75 MG tablet Take 1 tablet (75 mg total) by mouth daily. 30 tablet 5  . docusate sodium (COLACE) 100 MG capsule Take 1 capsule (100 mg total) by mouth daily. 10 capsule 2  . famotidine (PEPCID) 10 MG tablet Take 10 mg by mouth daily.    . fluticasone (FLOVENT HFA) 220 MCG/ACT inhaler Inhale 1 puff into the lungs 2 (two) times daily. May increase to 2 puffs if needed 1 Inhaler 12  . furosemide (LASIX) 20 MG tablet Take 1 tablet (20 mg total) by mouth daily. 90 tablet 0  . gabapentin (NEURONTIN) 300 MG capsule TAKE 2 CAPSULES BY MOUTH TWO TIMES DAILY *NEED OFFICE VISIT* 120 capsule 5  . glucose blood (ACCU-CHEK AVIVA PLUS) test strip  Use as instructed 100 each 12  . ipratropium (ATROVENT) 0.03 % nasal spray PLACE 2 SPRAYS INTO THE NOSE 4 (FOUR) TIMES DAILY. 60 mL 1  . JARDIANCE 10 MG TABS tablet TAKE 1 TABLET BY MOUTH DAILY 90 tablet 1  . Lancets Misc. (ACCU-CHEK SOFTCLIX LANCET DEV) KIT USE AS DIRECTED 1 kit 0  . lisinopril (ZESTRIL) 20 MG tablet TAKE 1 TABLET BY MOUTH ONCE DAILY 90 tablet 1  . metFORMIN (GLUCOPHAGE) 500 MG tablet TAKE 2 TABLETS BY MOUTH TWO TIMES DAILY 360 tablet 1  . methocarbamol (ROBAXIN) 500 MG tablet Take 1 tablet (500 mg total) by mouth 3 (three) times daily. 40 tablet 0  . metoprolol tartrate (LOPRESSOR) 25 MG tablet TAKE 1 TABLET BY MOUTH TWICE DAILY 180 tablet 1  . Multiple Vitamins-Minerals (MULTIVITAMIN PO) Take 2 tablets by mouth daily.    Marland Kitchen oxyCODONE-acetaminophen (PERCOCET/ROXICET) 5-325 MG tablet Take 0.5 tablets by mouth every 4 (four) hours as needed for severe pain (pain).    . QUEtiapine (SEROQUEL) 25 MG tablet Take 1 tablet (25 mg total) by mouth at bedtime. 90 tablet 3  . sertraline (ZOLOFT) 25 MG tablet Take 1 tablet (25 mg total) by mouth daily. 90 tablet 3  . TRADJENTA 5 MG TABS tablet TAKE 1/2 TABLET BY MOUTH DAILY *NEED OFFICE VISIT* 15 tablet 5   No current facility-administered medications on file prior to visit.    Review of Systems:  As per HPI- otherwise negative.   Physical Examination: Vitals:   07/12/19 1524  BP: (!) 100/58  Pulse: 60  Resp: 16  Temp: 97.8 F (36.6 C)  SpO2: 99%   Vitals:   07/12/19 1524  Weight: 178 lb (80.7 kg)  Height: 5' 1"  (1.549 m)   Body mass index is 33.63 kg/m. Ideal Body Weight:  Weight in (lb) to have BMI = 25: 132  GEN: no acute distress.  Overweight, looks well  HEENT: Atraumatic, Normocephalic.  Ears and Nose: No external deformity. CV: RRR, No M/G/R. No JVD. No thrill. No extra heart sounds. PULM: CTA B, no wheezes, crackles, rhonchi. No retractions. No resp. distress. No accessory muscle use. EXTR: No c/c/e PSYCH:  Normally interactive. Conversant.  Right shoulder; located, no swelling or redness.  Patient is able to actively flex and abduct to about 90 degrees.  Slightly greater range of motion with passive movement.  She has quite a bit of pain with any external or internal rotation.  Biceps, triceps normal.   Assessment and Plan: Hypotension due to drugs  Right shoulder injury, initial encounter - Plan: Ambulatory referral to Orthopedic Surgery, HYDROcodone-acetaminophen (NORCO/VICODIN) 5-325 MG tablet  Patient here today with a couple of concerns.  She recently hurt her right shoulder, I am concerned that she has a rotator cuff tear.  We will refer her to a different orthopedic practice for evaluation.  She plans to consult with her Worker's Marketing executive and see if she can resolve the balance she does at her other practice.  In the meantime, I encouraged her to try gentle range of motion exercises to prevent stiffness.  I have prescribed a small supply of hydrocodone to use as needed for more severe pain Patient has used hydrocodone in the recent past and tolerates well  We know borderline hypotension, likely she is excessively treated with antihypertensive agents.  We will decrease her metoprolol by 50%.  She will let me know how this works for her, we can also decrease lisinopril if needed  This visit occurred during the SARS-CoV-2 public health emergency.  Safety protocols were in place, including screening questions prior to the visit, additional usage of staff PPE, and extensive cleaning of exam room while observing appropriate contact time as indicated for disinfecting solutions.    Signed [name], MD

## 2019-07-12 NOTE — Patient Instructions (Addendum)
It was good to see you again today- I am going to refer you to [facility] orthopedics here in [place] to look at your shoulder In the meantime try to do some gentle range of motion exercises to prevent stiffness I gave you a few hydrocodone to use as needed for more severe pain- be cautious as this can cause sedation   Your BP is too low!  Please decrease your metoprolol to 1/2 tablet twice a day.  If still too low we can also decrease your lisinopril

## 2019-07-16 ENCOUNTER — Telehealth: Payer: Self-pay | Admitting: Family Medicine

## 2019-07-16 NOTE — Telephone Encounter (Signed)
Referral has been faxed via [facility] to [place] [facility] [phone] ([place])

## 2019-07-16 NOTE — Telephone Encounter (Signed)
Patient wishes to see [place] for injury.   [facility] [address], [place], [place] [address]  ~15.4 mi

## 2019-07-16 NOTE — Telephone Encounter (Signed)
Tried calling patient, no answer. Letter sent to patient on my chart. Also has been printed if patient would like to pick up.

## 2019-07-16 NOTE — Telephone Encounter (Signed)
Sure, ok to provide note.

## 2019-07-16 NOTE — Telephone Encounter (Signed)
Patient seen on 07/12/2019 for shoulder injury at work. Please advise if appropriate.

## 2019-07-16 NOTE — Telephone Encounter (Signed)
Caller: Sherry [name] Call Back # [phone]   Patient is requesting a work note , patient has been out of work since June 3,2021.Patient wishes to remain out of work until appointment with orthopedic specialist.  Please Advise

## 2019-07-27 DIAGNOSIS — M542 Cervicalgia: Secondary | ICD-10-CM | POA: Diagnosis not present

## 2019-07-27 DIAGNOSIS — M25511 Pain in right shoulder: Secondary | ICD-10-CM | POA: Diagnosis not present

## 2019-07-30 ENCOUNTER — Other Ambulatory Visit: Payer: Self-pay | Admitting: Family Medicine

## 2019-07-30 DIAGNOSIS — I10 Essential (primary) hypertension: Secondary | ICD-10-CM

## 2019-07-30 MED FILL — TRADJENTA 5 MG TABLET: 5 | 30 days supply | Qty: 15 | Fill #3

## 2019-07-30 MED FILL — METOPROLOL TARTRATE 25 MG T: 25 | 90 days supply | Qty: 180 | Fill #0

## 2019-07-30 MED FILL — LISINOPRIL 20 MG TABS: 20 | 90 days supply | Qty: 90 | Fill #0

## 2019-07-30 MED FILL — GABAPENTIN 300 MG CAPSULE: 300 | 30 days supply | Qty: 120 | Fill #3

## 2019-07-30 MED FILL — CLOPIDOGREL 75 MG TABLET: 75 | 30 days supply | Qty: 30 | Fill #3

## 2019-07-30 MED FILL — SERTRALINE HCL 50 MG TABS: 50 | 30 days supply | Qty: 15 | Fill #3

## 2019-08-01 MED FILL — FUROSEMIDE 20 MG TAB: 20 | 90 days supply | Qty: 90 | Fill #0

## 2019-08-02 ENCOUNTER — Ambulatory Visit (INDEPENDENT_AMBULATORY_CARE_PROVIDER_SITE_OTHER): Payer: Medicare Other | Admitting: Family Medicine

## 2019-08-02 ENCOUNTER — Encounter: Payer: Self-pay | Admitting: Family Medicine

## 2019-08-02 ENCOUNTER — Other Ambulatory Visit: Payer: Self-pay

## 2019-08-02 VITALS — BP 120/68 | HR 69 | Temp 98.0°F | Resp 12 | Ht 61.0 in | Wt 182.8 lb

## 2019-08-02 DIAGNOSIS — M25511 Pain in right shoulder: Secondary | ICD-10-CM | POA: Diagnosis not present

## 2019-08-02 DIAGNOSIS — S4991XA Unspecified injury of right shoulder and upper arm, initial encounter: Secondary | ICD-10-CM

## 2019-08-02 NOTE — Patient Instructions (Signed)
It was good to see you again today!   

## 2019-08-02 NOTE — Progress Notes (Signed)
Sherry [name] at [facility] [address], [place], [place] [address] [phone]  Date:  08/02/2019   Name:  Sherry [name]   DOB:  Mar 06, 1950   MRN:  [id number]  PCP:  Darreld Mclean, MD    Chief Complaint: fmla paperwork   History of Present Illness:  Sherry [name] is a 69 y.o. very pleasant female patient who presents with the following:  Here today for follow-up visit and discuss FMLA History of TIA 2016, hypertension, intracranial vascular stenosis, hypothyroidism, diabetes with peripheral neuropathy, CHF, hyperlipidemia Her vascular issues are followed by [facility] radiology Also history of brain aneurysm, most recent MRI January 2019  I last saw her on June 3.  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   He is currently 105 months old and weighs about 33 pounds.  Recently Blimie was moving him from his cot to his chair- She tripped on his GJ tube and pulled her right arm.  She was able to avoid dropping the child, but she had pretty severe shoulder pain that "felt like tearing" right away, and then then she developed aching in the shoulder and decreased ROM She went to [facility] and had a limited evaluation  She used a massage chair yesterday and this helped her some She has used tylenol and salanpas but continues to have quite a bit of pain She will use some methocambamol prn   She has had left shoulder surgery 3x in the past. She is not able to go back to her previous orthopedist office; she was surprised to find out that she owes a significant balance from a previous Worker's Comp. case.  She plans to consult with her Worker's Comp. Chief Executive Officer and see if she can resolve this issue  At last visit I referred her to a different orthopedist due to concern of possible right-sided rotator cuff tear We also decreased her metoprolol by 50% due to low blood  pressure  She went to [facility]- she has an MRI scheduled for tonight, her diagnosis is still pending  Right now she has very limited use of her right arm, she has minimal abduction and flexion of the right shoulder.  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 Traumatic hematoma of left lower leg 09/26/2018  . S/P total knee arthroplasty, left 03/29/2017  . Spondylosis without myelopathy or radiculopathy, cervical region 03/29/2017  . Bilateral carpal tunnel syndrome 03/29/2017  . Acute asthma exacerbation 08/19/2016  . Cervicalgia 03/23/2016  . Vertigo 04/14/2015  . Anxiety state 01/22/2015  . HLD (hyperlipidemia) 01/22/2015  . Type 2 diabetes mellitus with circulatory disorder ([place]) 01/22/2015  . Intracranial vascular stenosis 01/22/2015  . Aneurysm ([place])   . Headache   . Essential hypertension 11/12/2014  . Dependent edema 11/12/2014  . Peripheral neuropathy 11/12/2014  . Depression 11/12/2014  . Facial droop   . Chest pain   . TIA (transient ischemic attack) 11/11/2014  . Dyspnea 08/06/2014  . Hyperthyroidism 08/06/2014  . Meniere's disease 06/07/2014  . History of CHF (congestive heart failure) 06/07/2014  . Goiter 06/13/2013  . Obesity, unspecified 06/13/2013    Past Medical History:  Diagnosis Date  . Asthma   . Cerebral aneurysm   . Complication of anesthesia HYPOTENSION INTRAOP W/ HYSTERECTOMY IN 1987--  DID OK W/ TOTAL KNEE  IN 2008  . Diabetes mellitus   . DJD (degenerative joint disease)   . Dyslipidemia   . GERD (gastroesophageal reflux disease)   . H/O hiatal hernia   . History of CHF (congestive heart failure) 2010-  RESOLVED  . History of peptic ulcer YRS AGO  . Hypertension   . Meniere's disease   . Mitral valve prolapse occasional palpitations w/ chest discomfort  . OA (osteoarthritis)   . Rotator cuff tear, left   . Stroke [facility])     Past Surgical History:  Procedure  Laterality Date  . BUNIONECTOMY  2007   LEFT FOOT  . IR ANGIO INTRA EXTRACRAN SEL COM CAROTID INNOMINATE BILAT MOD SED  03/22/2019  . IR ANGIO VERTEBRAL SEL SUBCLAVIAN INNOMINATE UNI L MOD SED  03/22/2019  . IR ANGIO VERTEBRAL SEL VERTEBRAL UNI R MOD SED  03/22/2019  . IR US GUIDE VASC ACCESS RIGHT  03/22/2019  . LEFT SHOULDER SURGERY  1997   ROTATOR CUFF REPAIR  . SHOULDER ARTHROSCOPY  09/20/2011   Procedure: ARTHROSCOPY SHOULDER;  Surgeon: Johnn Hai, MD;  Location: [facility];  Service: Orthopedics;  Laterality: Left;  SAD AND MINI OPEN ROTATOR CUFF REPAIR    . TOTAL KNEE ARTHROPLASTY  09-12-2006  [facility]   RIGHT KNEE  . TOTAL KNEE ARTHROPLASTY Left 11/30/2017   Procedure: LEFT TOTAL KNEE REPLACEMENT;  Surgeon: [name], MD;  Location: [facility];  Service: Orthopedics;  Laterality: Left;  . TUBAL LIGATION  1976  . TYMPANOPLASTY  1990;   1980  . VAGINAL HYSTERECTOMY  1987    Social History   Tobacco Use  . Smoking status: Never Smoker  . Smokeless tobacco: Never Used  Vaping Use  . Vaping Use: Never used  Substance Use Topics  . Alcohol use: No  . Drug use: No    Family History  Problem Relation Age of Onset  . Heart disease Mother        in her 67s, heart attack  . Diabetes Mother   . Kidney disease Mother        dialysis  . Thyroid disease Mother   . Aneurysm Mother   . Ovarian cancer Mother   . Breast cancer Mother   . Thyroid disease Sister   . Heart disease Sister   . Lymphoma Father   . Diabetes Father   . Hypertension Father   . Congestive Heart Failure Father   . Thyroid disease Brother   . Thyroid disease Paternal Grandmother   . Heart attack Sister        at age 56  . Stroke Sister   . Hypertension Sister   . Breast cancer Maternal Aunt   . Breast cancer Maternal Grandmother   . Breast cancer Maternal Aunt   . Breast cancer Maternal Aunt   . Other Brother        Musician accident  . Bipolar disorder Son   . Other Son        GSW     Allergies  Allergen Reactions  . Influenza Vaccines Anaphylaxis  . Demerol Other (See Comments)    SEIZURE  . Nsaids Other (See Comments)    NAPROXEN, IBUPROFEN, SKELAXIN---  CAUSES PALPITATIONS  . Prednisone Other (See Comments)    Caused her glucose to go up to 500 and went to ED  . Tramadol Other (See Comments)    Contraindicated with Victoza   . Codeine Itching  . Mango Flavor [Flavoring Agent] Itching  Medication list has been reviewed and updated.  Current Outpatient Medications on File Prior to Visit  Medication Sig Dispense Refill  . blood glucose meter kit and supplies KIT Dispense based on patient and insurance preference. Use up to four times daily as directed. (FOR ICD-9 250.00, 250.01). Pt uses once a day 1 each 0  . cetirizine (ZYRTEC) 10 MG tablet Take 1 tablet (10 mg total) by mouth daily. 90 tablet 3  . clopidogrel (PLAVIX) 75 MG tablet Take 1 tablet (75 mg total) by mouth daily. 30 tablet 5  . famotidine (PEPCID) 10 MG tablet Take 10 mg by mouth daily.    . fluticasone (FLOVENT HFA) 220 MCG/ACT inhaler Inhale 1 puff into the lungs 2 (two) times daily. May increase to 2 puffs if needed 1 Inhaler 12  . furosemide (LASIX) 20 MG tablet TAKE 1 TABLET (20 MG TOTAL) BY MOUTH DAILY. 90 tablet 0  . gabapentin (NEURONTIN) 300 MG capsule TAKE 2 CAPSULES BY MOUTH TWO TIMES DAILY *NEED OFFICE VISIT* 120 capsule 5  . glucose blood (ACCU-CHEK AVIVA PLUS) test strip Use as instructed 100 each 12  . ipratropium (ATROVENT) 0.03 % nasal spray PLACE 2 SPRAYS INTO THE NOSE 4 (FOUR) TIMES DAILY. 60 mL 1  . JARDIANCE 10 MG TABS tablet TAKE 1 TABLET BY MOUTH DAILY (Patient taking differently: Take 5 mg by mouth daily. ) 90 tablet 1  . Lancets Misc. (ACCU-CHEK SOFTCLIX LANCET DEV) KIT USE AS DIRECTED 1 kit 0  . lisinopril (ZESTRIL) 20 MG tablet TAKE 1 TABLET BY MOUTH ONCE DAILY 90 tablet 1  . metFORMIN (GLUCOPHAGE) 500 MG tablet TAKE 2 TABLETS BY MOUTH TWO TIMES DAILY 360 tablet 1   . methocarbamol (ROBAXIN) 500 MG tablet Take 1 tablet (500 mg total) by mouth 3 (three) times daily. 40 tablet 0  . metoprolol tartrate (LOPRESSOR) 25 MG tablet TAKE 1 TABLET BY MOUTH TWICE DAILY (Patient taking differently: Take 12.5 mg by mouth 2 (two) times daily. ) 180 tablet 1  . Multiple Vitamins-Minerals (MULTIVITAMIN PO) Take 2 tablets by mouth daily.    Marland Kitchen oxyCODONE-acetaminophen (PERCOCET/ROXICET) 5-325 MG tablet Take 0.5 tablets by mouth every 4 (four) hours as needed for severe pain (pain).    . QUEtiapine (SEROQUEL) 25 MG tablet Take 1 tablet (25 mg total) by mouth at bedtime. 90 tablet 3  . sertraline (ZOLOFT) 25 MG tablet Take 1 tablet (25 mg total) by mouth daily. 90 tablet 3  . TRADJENTA 5 MG TABS tablet TAKE 1/2 TABLET BY MOUTH DAILY *NEED OFFICE VISIT* 15 tablet 5   No current facility-administered medications on file prior to visit.    Review of Systems:  As per HPI- otherwise negative.   Physical Examination: Vitals:   08/02/19 1354  BP: 120/68  Pulse: 69  Resp: 12  Temp: 98 F (36.7 C)  SpO2: 97%   Vitals:   08/02/19 1354  Weight: 182 lb 12.8 oz (82.9 kg)  Height: 5' 1"  (1.549 m)   Body mass index is 34.54 kg/m. Ideal Body Weight: Weight in (lb) to have BMI = 25: 132  GEN: no acute distress.  Overweight, looks well and her normal self HEENT: Atraumatic, Normocephalic.  Ears and Nose: No external deformity. CV: RRR, No M/G/R. No JVD. No thrill. No extra heart sounds. PULM: CTA B, no wheezes, crackles, rhonchi. No retractions. No resp. distress. No accessory muscle use. EXTR: No c/c/e PSYCH: Normally interactive. Conversant.  Patient has very limited mobility of her right shoulder, any  internal or external rotation is quite painful.  She is able to flex and abduct to less than 45 degrees Grip strength is normal   Assessment and Plan: Right shoulder injury, initial encounter  Here today to discuss a right shoulder injury and associated FMLA  paperwork.  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  We are not sure how long she will be out of work as her diagnosis is still pending, she has an MRI scheduled tonight.  For the time being we will have her be out of work through September 1.  She will continue to follow-up with her orthopedist as planned She will let me know if any changes or concerns in the meantime This visit occurred during the SARS-CoV-2 public health emergency.  Safety protocols were in place, including screening questions prior to the visit, additional usage of staff PPE, and extensive cleaning of exam room while observing appropriate contact time as indicated for disinfecting solutions.    Signed [name], MD

## 2019-08-07 ENCOUNTER — Telehealth: Payer: Self-pay

## 2019-08-07 DIAGNOSIS — M25511 Pain in right shoulder: Secondary | ICD-10-CM | POA: Diagnosis not present

## 2019-08-07 NOTE — Telephone Encounter (Signed)
   [facility] Pre-operative Risk Assessment    HEARTCARE STAFF: - Please ensure there is not already an duplicate clearance open for this procedure. - Under Visit Info/Reason for Call, type in Other and utilize the format Clearance MM/DD/YY or Clearance TBD. Do not use dashes or single digits. - If request is for dental extraction, please clarify the # of teeth to be extracted.  Request for surgical clearance:  1. What type of surgery is being performed? RIGHT REVERSE TOTAL SHOULDER   2. When is this surgery scheduled? TBD   3. What type of clearance is required (medical clearance vs. Pharmacy clearance to hold med vs. Both)? BOTH  4. Are there any medications that need to be held prior to surgery and how long? PLAVIX  5. Practice name and name of physician performing surgery? Raliegh Ip, DR.DAX VARKEY   6. What is the office phone number? [phone]   7.   What is the office fax number? [place]   Anesthesia type (None, local, MAC, general) ? NOT LISTED   Sherry [name] 08/07/2019, 4:21 PM  _________________________________________________________________   (provider comments below)

## 2019-08-08 ENCOUNTER — Telehealth: Payer: Self-pay

## 2019-08-08 NOTE — Telephone Encounter (Signed)
Ok to hold Plavix 5 days prior to surgery. Resume when safe after.  Dr. Debara Pickett

## 2019-08-08 NOTE — Telephone Encounter (Signed)
Received surgical clearance form from [facility] form in providers folder for review. Looks like cardiology might be doing the clearance as well.

## 2019-08-08 NOTE — Telephone Encounter (Signed)
   Primary Cardiologist: Sherry Casino, MD  Chart reviewed as part of pre-operative protocol coverage. Given past medical history and time since last visit, based on ACC/AHA guidelines, [name] would be at acceptable risk for the planned procedure without further cardiovascular testing.   Her Plavix may be held for 5 days prior to her surgery.  Please resume as soon as hemostasis is achieved.  I will route this recommendation to the requesting party via Epic fax function and remove from pre-op pool.  Please call with questions.  Jossie Ng. Tanner Vigna NP-C    08/08/2019, 2:44 PM [facility] [address] Office [phone] Fax [phone]

## 2019-08-09 ENCOUNTER — Telehealth: Payer: Self-pay | Admitting: *Deleted

## 2019-08-09 NOTE — Telephone Encounter (Signed)
Pt is cleared by cardiology (see note in Epic) and her BP, A1c, BMI are acceptable.  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  Will fax back clearance form to ortho with this note,and will contact her neurology provider at [place] MD   Sherry [name], please call Sherry [name] for this pt.  I don't know who to send message to at [facility] because it looks like her previous doc and the NP who last saw her no longer work there.  Can you let them know that pt needs eval for pre-op clearance.  Thank you!

## 2019-08-09 NOTE — Telephone Encounter (Signed)
Pt PCP office (Jessica Copland,MD) called to schedule appt for pt for pre-op surgical clearance. She has hx TIA/aneurysm. CM,NP and Dr. Erlinda Hong no longer here. JM,NP has no f/u slots available. Scheduled appt for her to see Dr. Leonie Man 09/05/19 at 2:30pm, check in 2:00. Advised pt should bring insurance cards/med list to appt. They will let pt know.

## 2019-08-10 MED FILL — IPRATROPIUM 0.03% SPRAY: 0.03 | 86 days supply | Qty: 60 | Fill #1

## 2019-08-10 NOTE — Telephone Encounter (Signed)
Patient aware appointment scheduled at [facility] for 7/28 at 2:30 with Dr. Willaim Rayas for surgical clearance.

## 2019-08-30 MED FILL — CLOPIDOGREL 75 MG TABLET: 75 | 30 days supply | Qty: 30 | Fill #4

## 2019-08-30 MED FILL — TRADJENTA 5 MG TABLET: 5 | 30 days supply | Qty: 15 | Fill #4

## 2019-08-30 MED FILL — GABAPENTIN 300 MG CAPSULE: 300 | 30 days supply | Qty: 120 | Fill #4

## 2019-08-31 MED FILL — SERTRALINE HCL 50 MG TABLET: 50 | 30 days supply | Qty: 15 | Fill #4

## 2019-09-04 ENCOUNTER — Emergency Department (HOSPITAL_BASED_OUTPATIENT_CLINIC_OR_DEPARTMENT_OTHER): Payer: Medicare Other

## 2019-09-04 ENCOUNTER — Encounter (HOSPITAL_BASED_OUTPATIENT_CLINIC_OR_DEPARTMENT_OTHER): Payer: Self-pay | Admitting: *Deleted

## 2019-09-04 ENCOUNTER — Emergency Department (HOSPITAL_BASED_OUTPATIENT_CLINIC_OR_DEPARTMENT_OTHER)
Admission: EM | Admit: 2019-09-04 | Discharge: 2019-09-04 | Disposition: A | Discharge status: Home / Self Care | Payer: Medicare Other | Attending: Emergency Medicine | Admitting: Emergency Medicine

## 2019-09-04 ENCOUNTER — Other Ambulatory Visit: Payer: Self-pay | Admitting: Adult Health

## 2019-09-04 ENCOUNTER — Other Ambulatory Visit: Payer: Self-pay

## 2019-09-04 DIAGNOSIS — E1140 Type 2 diabetes mellitus with diabetic neuropathy, unspecified: Secondary | ICD-10-CM | POA: Diagnosis not present

## 2019-09-04 DIAGNOSIS — U071 COVID-19: Secondary | ICD-10-CM

## 2019-09-04 DIAGNOSIS — R0602 Shortness of breath: Secondary | ICD-10-CM | POA: Diagnosis not present

## 2019-09-04 DIAGNOSIS — I509 Heart failure, unspecified: Secondary | ICD-10-CM | POA: Insufficient documentation

## 2019-09-04 DIAGNOSIS — J9811 Atelectasis: Secondary | ICD-10-CM | POA: Diagnosis not present

## 2019-09-04 DIAGNOSIS — J45901 Unspecified asthma with (acute) exacerbation: Secondary | ICD-10-CM | POA: Diagnosis not present

## 2019-09-04 DIAGNOSIS — J189 Pneumonia, unspecified organism: Secondary | ICD-10-CM | POA: Diagnosis not present

## 2019-09-04 DIAGNOSIS — I110 Hypertensive heart disease with heart failure: Secondary | ICD-10-CM | POA: Insufficient documentation

## 2019-09-04 DIAGNOSIS — Z79899 Other long term (current) drug therapy: Secondary | ICD-10-CM | POA: Diagnosis not present

## 2019-09-04 DIAGNOSIS — Z8673 Personal history of transient ischemic attack (TIA), and cerebral infarction without residual deficits: Secondary | ICD-10-CM | POA: Diagnosis not present

## 2019-09-04 DIAGNOSIS — R509 Fever, unspecified: Secondary | ICD-10-CM | POA: Diagnosis present

## 2019-09-04 DIAGNOSIS — Z96653 Presence of artificial knee joint, bilateral: Secondary | ICD-10-CM | POA: Diagnosis not present

## 2019-09-04 DIAGNOSIS — Z7984 Long term (current) use of oral hypoglycemic drugs: Secondary | ICD-10-CM | POA: Diagnosis not present

## 2019-09-04 DIAGNOSIS — Z7951 Long term (current) use of inhaled steroids: Secondary | ICD-10-CM | POA: Insufficient documentation

## 2019-09-04 LAB — CBC WITH DIFFERENTIAL/PLATELET
Abs Immature Granulocytes: 0.02 10*3/uL (ref 0.00–0.07)
Basophils Absolute: 0 10*3/uL (ref 0.0–0.1)
Basophils Relative: 0 %
Eosinophils Absolute: 0.2 10*3/uL (ref 0.0–0.5)
Eosinophils Relative: 3 %
HCT: 42.8 % (ref 36.0–46.0)
Hemoglobin: 13.4 g/dL (ref 12.0–15.0)
Immature Granulocytes: 0 %
Lymphocytes Relative: 32 %
Lymphs Abs: 1.9 10*3/uL (ref 0.7–4.0)
MCH: 28.7 pg (ref 26.0–34.0)
MCHC: 31.3 g/dL (ref 30.0–36.0)
MCV: 91.6 fL (ref 80.0–100.0)
Monocytes Absolute: 0.5 10*3/uL (ref 0.1–1.0)
Monocytes Relative: 8 %
Neutro Abs: 3.3 10*3/uL (ref 1.7–7.7)
Neutrophils Relative %: 57 %
Platelets: 196 10*3/uL (ref 150–400)
RBC: 4.67 MIL/uL (ref 3.87–5.11)
RDW: 14 % (ref 11.5–15.5)
WBC: 5.8 10*3/uL (ref 4.0–10.5)
nRBC: 0 % (ref 0.0–0.2)

## 2019-09-04 LAB — COMPREHENSIVE METABOLIC PANEL
ALT: 14 U/L (ref 0–44)
AST: 19 U/L (ref 15–41)
Albumin: 3.6 g/dL (ref 3.5–5.0)
Alkaline Phosphatase: 56 U/L (ref 38–126)
Anion gap: 12 (ref 5–15)
BUN: 10 mg/dL (ref 8–23)
CO2: 23 mmol/L (ref 22–32)
Calcium: 8.3 mg/dL — ABNORMAL LOW (ref 8.9–10.3)
Chloride: 102 mmol/L (ref 98–111)
Creatinine, Ser: 0.81 mg/dL (ref 0.44–1.00)
GFR calc Af Amer: >60 mL/min (ref >60)
GFR calc non Af Amer: >60 mL/min (ref >60)
Glucose, Bld: 135 mg/dL — ABNORMAL HIGH (ref 70–99)
Potassium: 3.5 mmol/L (ref 3.5–5.1)
Sodium: 137 mmol/L (ref 135–145)
Total Bilirubin: 0.5 mg/dL (ref 0.3–1.2)
Total Protein: 6.6 g/dL (ref 6.5–8.1)

## 2019-09-04 LAB — LACTIC ACID, PLASMA: Lactic Acid, Venous: 1.2 mmol/L (ref 0.5–1.9)

## 2019-09-04 LAB — SARS CORONAVIRUS 2 BY RT PCR (HOSPITAL ORDER, PERFORMED IN ~~LOC~~ HOSPITAL LAB): SARS Coronavirus 2: POSITIVE — AB

## 2019-09-04 MED ORDER — SODIUM CHLORIDE 0.9% FLUSH
3.0000 mL | Freq: Once | INTRAVENOUS | Status: DC
  Filled 2019-09-04: qty 3

## 2019-09-04 MED ORDER — ACETAMINOPHEN 500 MG PO TABS
1000.0000 mg | ORAL_TABLET | Freq: Once | ORAL | Status: AC
  Administered 2019-09-04: 1000 mg via ORAL
  Filled 2019-09-04: qty 2

## 2019-09-04 NOTE — ED Notes (Signed)
Covid Swab obtained

## 2019-09-04 NOTE — ED Notes (Signed)
States she began not feeling well this past Thursday night, stayed in bed until the weekend, took tylenol to aid in aches and fever control, appetite poor

## 2019-09-04 NOTE — ED Notes (Signed)
Blood Cultures x 1 obtained as well

## 2019-09-04 NOTE — ED Notes (Signed)
PLACED ON CONT CARDIAC MONITORING WITH CONT POX AND INT NBP ASSESSMENTS,

## 2019-09-04 NOTE — Progress Notes (Signed)
I connected by phone with Sherry [name] on 09/04/2019 at 8:44 PM to discuss the potential use of a new treatment for mild to moderate COVID-19 viral infection in non-hospitalized patients.  This patient is a 69 y.o. female that meets the FDA criteria for Emergency Use Authorization of COVID monoclonal antibody casirivimab/imdevimab.  Has a (+) direct SARS-CoV-2 viral test result  Has mild or moderate COVID-19   Is NOT hospitalized due to COVID-19  Is within 10 days of symptom onset  Has at least one of the high risk factor(s) for progression to severe COVID-19 and/or hospitalization as defined in EUA.  Specific high risk criteria : Older age (>/= 69 yo)   I have spoken and communicated the following to the patient or parent/caregiver regarding COVID monoclonal antibody treatment:  1. FDA has authorized the emergency use for the treatment of mild to moderate COVID-19 in adults and pediatric patients with positive results of direct SARS-CoV-2 viral testing who are 27 years of age and older weighing at least 40 kg, and who are at high risk for progressing to severe COVID-19 and/or hospitalization.  2. The significant known and potential risks and benefits of COVID monoclonal antibody, and the extent to which such potential risks and benefits are unknown.  3. Information on available alternative treatments and the risks and benefits of those alternatives, including clinical trials.  4. Patients treated with COVID monoclonal antibody should continue to self-isolate and use infection control measures (e.g., wear mask, isolate, social distance, avoid sharing personal items, clean and disinfect "high touch" surfaces, and frequent handwashing) according to CDC guidelines.   5. The patient or parent/caregiver has the option to accept or refuse COVID monoclonal antibody treatment.  After reviewing this information with the patient, The patient agreed to proceed with receiving  casirivimab\imdevimab infusion and will be provided a copy of the Fact sheet prior to receiving the infusion. Scot Dock 09/04/2019 8:44 PM

## 2019-09-04 NOTE — ED Provider Notes (Signed)
[facility] EMERGENCY DEPARTMENT Provider Note   CSN: [id number] Arrival date & time: 09/04/19  1401     History Chief Complaint  Patient presents with  . Fever    Sherry [name] is a 69 y.o. female.  69 year old female with history of diabetes, CHF, CVA with additional history listed below presents with complaint of fever, body aches, cough, shortness of breath, diarrhea and nausea.  Patient states that her symptoms started on Thursday night with a headache, states that her family said that she was confused that day although she does not feel like she has been confused.  Also reports loss of sense of smell and taste.  Patient states her symptoms started 5 days ago, states she has been exposed to multiple other family members who live with her and have similar symptoms.  Patient has not been vaccinated against COVID-19 due to allergic reaction to prior flu vaccine.  Patient last took Tylenol at 7 AM today, has been monitoring her O2 sats at home, states that she is as low as 94% at rest, has not obtained ambulatory pulse ox, states if she tries to walk around she gets too short of breath and dizzy.  Patient took Imodium for her diarrhea and states that has eased.  No other complaints or concerns today.  Sherry [name] was evaluated in Emergency Department on 09/04/2019 for the symptoms described in the history of present illness. She was evaluated in the context of the global COVID-19 pandemic, which necessitated consideration that the patient might be at risk for infection with the SARS-CoV-2 virus that causes COVID-19. Institutional protocols and algorithms that pertain to the evaluation of patients at risk for COVID-19 are in a state of rapid change based on information released by regulatory bodies including the CDC and federal and state organizations. These policies and algorithms were followed during the patient's care in the ED.         Past Medical History:   Diagnosis Date  . Asthma   . Cerebral aneurysm   . Complication of anesthesia HYPOTENSION INTRAOP W/ HYSTERECTOMY IN 1987--  DID OK W/ TOTAL KNEE IN 2008  . Diabetes mellitus   . DJD (degenerative joint disease)   . Dyslipidemia   . GERD (gastroesophageal reflux disease)   . H/O hiatal hernia   . History of CHF (congestive heart failure) 2010-  RESOLVED  . History of peptic ulcer YRS AGO  . Hypertension   . Meniere's disease   . Mitral valve prolapse occasional palpitations w/ chest discomfort  . OA (osteoarthritis)   . Rotator cuff tear, left   . Stroke [facility])     Patient Active Problem List   Diagnosis Date Noted  . Traumatic hematoma of left lower leg 09/26/2018  . S/P total knee arthroplasty, left 03/29/2017  . Spondylosis without myelopathy or radiculopathy, cervical region 03/29/2017  . Bilateral carpal tunnel syndrome 03/29/2017  . Acute asthma exacerbation 08/19/2016  . Cervicalgia 03/23/2016  . Vertigo 04/14/2015  . Anxiety state 01/22/2015  . HLD (hyperlipidemia) 01/22/2015  . Type 2 diabetes mellitus with circulatory disorder ([place]) 01/22/2015  . Intracranial vascular stenosis 01/22/2015  . Aneurysm ([place])   . Headache   . Essential hypertension 11/12/2014  . Dependent edema 11/12/2014  . Peripheral neuropathy 11/12/2014  . Depression 11/12/2014  . Facial droop   . Chest pain   . TIA (transient ischemic attack) 11/11/2014  . Dyspnea 08/06/2014  . Hyperthyroidism 08/06/2014  . Meniere's disease 06/07/2014  .  History of CHF (congestive heart failure) 06/07/2014  . Goiter 06/13/2013  . Obesity, unspecified 06/13/2013    Past Surgical History:  Procedure Laterality Date  . BUNIONECTOMY  2007   LEFT FOOT  . IR ANGIO INTRA EXTRACRAN SEL COM CAROTID INNOMINATE BILAT MOD SED  03/22/2019  . IR ANGIO VERTEBRAL SEL SUBCLAVIAN INNOMINATE UNI L MOD SED  03/22/2019  . IR ANGIO VERTEBRAL SEL VERTEBRAL UNI R MOD SED  03/22/2019  . IR US GUIDE VASC ACCESS RIGHT  03/22/2019   . LEFT SHOULDER SURGERY  1997   ROTATOR CUFF REPAIR  . SHOULDER ARTHROSCOPY  09/20/2011   Procedure: ARTHROSCOPY SHOULDER;  Surgeon: Johnn Hai, MD;  Location: [facility];  Service: Orthopedics;  Laterality: Left;  SAD AND MINI OPEN ROTATOR CUFF REPAIR    . TOTAL KNEE ARTHROPLASTY  09-12-2006  [facility]   RIGHT KNEE  . TOTAL KNEE ARTHROPLASTY Left 11/30/2017   Procedure: LEFT TOTAL KNEE REPLACEMENT;  Surgeon: [name], MD;  Location: [facility];  Service: Orthopedics;  Laterality: Left;  . TUBAL LIGATION  1976  . TYMPANOPLASTY  1990;   1980  . VAGINAL HYSTERECTOMY  1987     OB History    Gravida  6   Para  4   Term  4   Preterm      AB  2   Living  4     SAB      TAB  2   Ectopic      Multiple      Live Births              Family History  Problem Relation Age of Onset  . Heart disease Mother        in her 71s, heart attack  . Diabetes Mother   . Kidney disease Mother        dialysis  . Thyroid disease Mother   . Aneurysm Mother   . Ovarian cancer Mother   . Breast cancer Mother   . Thyroid disease Sister   . Heart disease Sister   . Lymphoma Father   . Diabetes Father   . Hypertension Father   . Congestive Heart Failure Father   . Thyroid disease Brother   . Thyroid disease Paternal Grandmother   . Heart attack Sister        at age 22  . Stroke Sister   . Hypertension Sister   . Breast cancer Maternal Aunt   . Breast cancer Maternal Grandmother   . Breast cancer Maternal Aunt   . Breast cancer Maternal Aunt   . Other Brother        Musician accident  . Bipolar disorder Son   . Other Son        GSW    Social History   Tobacco Use  . Smoking status: Never Smoker  . Smokeless tobacco: Never Used  Vaping Use  . Vaping Use: Never used  Substance Use Topics  . Alcohol use: No  . Drug use: No    Home Medications Prior to Admission medications   Medication Sig Start Date End Date Taking? Authorizing Provider  blood  glucose meter kit and supplies KIT Dispense based on patient and insurance preference. Use up to four times daily as directed. (FOR ICD-9 250.00, 250.01). Pt uses once a day 03/23/18   Copland, Gay Filler, MD  cetirizine (ZYRTEC) 10 MG tablet Take 1 tablet (10 mg total) by mouth daily. 07/16/16  Copland, Gay Filler, MD  clopidogrel (PLAVIX) 75 MG tablet Take 1 tablet (75 mg total) by mouth daily. 05/04/19   Copland, Gay Filler, MD  famotidine (PEPCID) 10 MG tablet Take 10 mg by mouth daily.    [provider]  fluticasone (FLOVENT HFA) 220 MCG/ACT inhaler Inhale 1 puff into the lungs 2 (two) times daily. May increase to 2 puffs if needed 08/25/16   Copland, Gay Filler, MD  furosemide (LASIX) 20 MG tablet TAKE 1 TABLET (20 MG TOTAL) BY MOUTH DAILY. 07/30/19   Copland, Gay Filler, MD  gabapentin (NEURONTIN) 300 MG capsule TAKE 2 CAPSULES BY MOUTH TWO TIMES DAILY *NEED OFFICE VISIT* 05/04/19   Copland, Gay Filler, MD  glucose blood (ACCU-CHEK AVIVA PLUS) test strip Use as instructed 02/14/18   Copland, Gay Filler, MD  ipratropium (ATROVENT) 0.03 % nasal spray PLACE 2 SPRAYS INTO THE NOSE 4 (FOUR) TIMES DAILY. 01/02/19   Copland, Gay Filler, MD  JARDIANCE 10 MG TABS tablet TAKE 1 TABLET BY MOUTH DAILY Patient taking differently: Take 5 mg by mouth daily.  04/20/19   Copland, Gay Filler, MD  Lancets Misc. (ACCU-CHEK SOFTCLIX LANCET DEV) KIT USE AS DIRECTED 03/18/17   Renato Shin, MD  lisinopril (ZESTRIL) 20 MG tablet TAKE 1 TABLET BY MOUTH ONCE DAILY 06/29/19   Copland, Gay Filler, MD  metFORMIN (GLUCOPHAGE) 500 MG tablet TAKE 2 TABLETS BY MOUTH TWO TIMES DAILY 06/29/19   Copland, Gay Filler, MD  methocarbamol (ROBAXIN) 500 MG tablet Take 1 tablet (500 mg total) by mouth 3 (three) times daily. 09/26/18   Roma Schanz R, DO  metoprolol tartrate (LOPRESSOR) 25 MG tablet TAKE 1 TABLET BY MOUTH TWICE DAILY Patient taking differently: Take 12.5 mg by mouth 2 (two) times daily.  06/29/19   Copland, Gay Filler, MD   Multiple Vitamins-Minerals (MULTIVITAMIN PO) Take 2 tablets by mouth daily.    [provider]  oxyCODONE-acetaminophen (PERCOCET/ROXICET) 5-325 MG tablet Take 0.5 tablets by mouth every 4 (four) hours as needed for severe pain (pain).    [provider]  QUEtiapine (SEROQUEL) 25 MG tablet Take 1 tablet (25 mg total) by mouth at bedtime. 07/05/19   Copland, Gay Filler, MD  sertraline (ZOLOFT) 25 MG tablet Take 1 tablet (25 mg total) by mouth daily. 07/05/19   Copland, Gay Filler, MD  TRADJENTA 5 MG TABS tablet TAKE 1/2 TABLET BY MOUTH DAILY *NEED OFFICE VISIT* 05/04/19   Copland, Gay Filler, MD    Allergies    Influenza vaccines, Demerol, Nsaids, Prednisone, Tramadol, Codeine, and Mango flavor [flavoring agent]  Review of Systems   Review of Systems  Constitutional: Positive for fever.  HENT: Negative for congestion.   Respiratory: Positive for cough and shortness of breath.   Cardiovascular: Negative for chest pain and leg swelling.  Gastrointestinal: Positive for diarrhea and nausea.  Genitourinary: Negative for decreased urine volume, difficulty urinating, dysuria and frequency.  Musculoskeletal: Positive for arthralgias and myalgias.  Skin: Negative for rash and wound.  Allergic/Immunologic: Positive for immunocompromised state.  Neurological: Positive for dizziness and weakness.  Psychiatric/Behavioral: Positive for confusion.  All other systems reviewed and are negative.   Physical Exam Updated Vital Signs BP (!) 116/61 (BP Location: Left Arm)   Pulse 76   Temp 99.1 F (37.3 C) (Oral)   Resp 18   Ht 5' 1"  (1.549 m)   Wt 81.6 kg   SpO2 98%   BMI 34.01 kg/m   Physical Exam Vitals and nursing note reviewed.  Constitutional:      General: She is not in acute distress.    Appearance: She is well-developed. She is not diaphoretic.  HENT:     Head: Normocephalic and atraumatic.     Mouth/Throat:     Mouth: Mucous membranes are dry.  Cardiovascular:      Rate and Rhythm: Normal rate and regular rhythm.     Pulses: Normal pulses.     Heart sounds: Normal heart sounds.  Pulmonary:     Effort: Pulmonary effort is normal.     Breath sounds: Normal breath sounds.  Abdominal:     General: There is no distension.     Palpations: Abdomen is soft.     Tenderness: There is no abdominal tenderness.  Musculoskeletal:        General: No tenderness.  Skin:    General: Skin is warm and dry.     Findings: No erythema or rash.     Comments: Hot to the touch  Neurological:     Mental Status: She is alert and oriented to person, place, and time.  Psychiatric:        Behavior: Behavior normal.     ED Results / Procedures / Treatments   Labs (all labs ordered are listed, but only abnormal results are displayed) Labs Reviewed  SARS CORONAVIRUS 2 BY RT PCR ([facility] [place]) - Abnormal; Notable for the following components:      Result Value   SARS Coronavirus 2 POSITIVE (*)    All other components within normal limits  COMPREHENSIVE METABOLIC PANEL - Abnormal; Notable for the following components:   Glucose, Bld 135 (*)    Calcium 8.3 (*)    All other components within normal limits  LACTIC ACID, PLASMA  CBC WITH DIFFERENTIAL/PLATELET  LACTIC ACID, PLASMA  URINALYSIS, ROUTINE W REFLEX MICROSCOPIC    EKG EKG Interpretation  Date/Time:  Tuesday September 04 2019 14:19:54 EDT Ventricular Rate:  84 PR Interval:  148 QRS Duration: 80 QT Interval:  350 QTC Calculation: 413 R Axis:   98 Text Interpretation: Normal sinus rhythm Rightward axis T wave abnormality, consider anterior ischemia Abnormal ECG No sig change from Jan 2019 ecg.  No STEMI Confirmed by Octaviano Glow [phone]) on 09/04/2019 2:24:25 PM   Radiology DG Chest Port 1 View  Result Date: 09/04/2019 CLINICAL DATA:  Cough, fever, shortness of breath. EXAM: PORTABLE CHEST 1 VIEW COMPARISON:  Radiograph 03/09/2017 FINDINGS: Minimal patchy opacity at  the right lung base. Normal heart size and mediastinal contours. No pulmonary edema. No pleural fluid or pneumothorax. Chronic change about the right shoulder. Surgical anchor in the left proximal humerus. No acute osseous abnormalities are seen. IMPRESSION: Minimal patchy opacity at the right lung base, may be atelectasis or possibly pneumonia in the appropriate clinical setting. Electronically Signed   By: Keith Rake M.D.   On: 09/04/2019 15:00    Procedures Procedures (including critical care time)  Medications Ordered in ED Medications  sodium chloride flush (NS) 0.9 % injection 3 mL (has no administration in time range)  acetaminophen (TYLENOL) tablet 1,000 mg (1,000 mg Oral Given 09/04/19 1453)    ED Course  I have reviewed the triage vital signs and the nursing notes.  Pertinent labs & imaging results that were available during my care of the patient were reviewed by me and considered in my medical decision making (see chart for details).  Clinical Course as of Sep 03 1624  Tue Sep 03, 3744  [id number] 69 year old female with history listed above presents with 5 days of fever, headache, body aches, cough, shortness of breath.  Symptoms consistent and concerning for COVID-19 infection.  Patient is well-appearing, respirations are even and nonlabored, oxygen saturation 98% on room air.  Patient was febrile on arrival with a temp of 103.1, she was given Tylenol and temperature improved to 99.1.  Chest x-ray with concern for right lower lobe infiltrate however with Covid 19 test positive suspect this is viral in nature secondary to Covid.  Labs reassuring including CBC, CMP, lactic acid (1.2).  Discussed with Dr. Langston Masker, ER attending, plan is to refer to [facility], clinic contacted and will contact patient to discuss treatment option.  Discussed results with patient, patient is a retired Marine scientist, feels comfortable monitoring her symptoms at home, has a pulse ox at home to use.  Encouraged use of  Tylenol for fevers, return to ER for worsening or concerning symptoms and recommend contacting her sick family members to inform them of her diagnosis and possible need for quarantine.   [LM]    Clinical Course User Index [LM] Roque Lias   MDM Rules/Calculators/A&P                         Final Clinical Impression(s) / ED Diagnoses Final diagnoses:  [id number]    Rx / DC Orders ED Discharge Orders    None       Tacy Learn, PA-C 09/04/19 1626    Wyvonnia Dusky, MD 09/04/19 1731

## 2019-09-04 NOTE — Discharge Instructions (Addendum)
The monoclonal antibody team will contact you to discuss treatment. Home to rest and quarantine. Notify your immediate contacts of your positive test result, encourage PCR testing of symptomatic contacts. Return to ER for new or worsening symptoms.  Continue with Tylenol for fever as needed as directed.  Monitor your oxygen saturation, return to ER for O2 saturation less than 90%.

## 2019-09-04 NOTE — ED Triage Notes (Signed)
Fever, cough, sob, headache and no appetite. Symptoms x 1 week. She is alert and oriented. She took Tylenol 7 hours ago.

## 2019-09-05 ENCOUNTER — Ambulatory Visit: Payer: Self-pay | Admitting: Neurology

## 2019-09-05 ENCOUNTER — Telehealth: Payer: Self-pay | Admitting: Family Medicine

## 2019-09-05 MED ORDER — SODIUM CHLORIDE 0.9 % IV SOLN
Freq: Once | INTRAVENOUS | Status: AC
  Filled 2019-09-05: qty 600

## 2019-09-05 NOTE — Telephone Encounter (Signed)
Patient called to notify  Dr Lorelei Pont that she we to ED yesterday and diagnosed with COVID 19. She will begin getting antibodies tomorrow at a [facility].

## 2019-09-06 ENCOUNTER — Ambulatory Visit (HOSPITAL_COMMUNITY)
Admission: RE | Admit: 2019-09-06 | Discharge: 2019-09-06 | Disposition: A | Discharge status: Home / Self Care | Payer: Medicare Other | Source: Ambulatory Visit | Attending: Pulmonary Disease | Admitting: Pulmonary Disease

## 2019-09-06 DIAGNOSIS — Z23 Encounter for immunization: Secondary | ICD-10-CM | POA: Insufficient documentation

## 2019-09-06 DIAGNOSIS — U071 COVID-19: Secondary | ICD-10-CM

## 2019-09-06 MED ORDER — FAMOTIDINE IN NACL 20-0.9 MG/50ML-% IV SOLN: 20.0000 mg | Freq: Once | INTRAVENOUS | Status: DC | PRN

## 2019-09-06 MED ORDER — METHYLPREDNISOLONE SODIUM SUCC 125 MG IJ SOLR: 125.0000 mg | Freq: Once | INTRAMUSCULAR | Status: DC | PRN

## 2019-09-06 MED ORDER — ACETAMINOPHEN 325 MG PO TABS
650.0000 mg | ORAL_TABLET | Freq: Once | ORAL | Status: AC
  Administered 2019-09-06: 650 mg via ORAL
  Filled 2019-09-06: qty 2

## 2019-09-06 MED ORDER — DIPHENHYDRAMINE HCL 50 MG/ML IJ SOLN: 50.0000 mg | Freq: Once | INTRAMUSCULAR | Status: DC | PRN

## 2019-09-06 MED ORDER — SODIUM CHLORIDE 0.9 % IV SOLN: INTRAVENOUS | Status: DC | PRN

## 2019-09-06 MED ORDER — ALBUTEROL SULFATE HFA 108 (90 BASE) MCG/ACT IN AERS: 2.0000 | INHALATION_SPRAY | Freq: Once | RESPIRATORY_TRACT | Status: DC | PRN

## 2019-09-06 MED ORDER — EPINEPHRINE 0.3 MG/0.3ML IJ SOAJ: 0.3000 mg | Freq: Once | INTRAMUSCULAR | Status: DC | PRN

## 2019-09-06 MED FILL — CLOPIDOGREL 75 MG TABLET: 75 | 30 days supply | Qty: 30 | Fill #4

## 2019-09-06 MED FILL — TRADJENTA 5 MG TABLET: 5 | 30 days supply | Qty: 15 | Fill #4

## 2019-09-06 MED FILL — GABAPENTIN 300 MG CAPSULE: 300 | 30 days supply | Qty: 120 | Fill #4

## 2019-09-06 NOTE — Discharge Instructions (Signed)
10 Things You Can Do to Manage Your COVID-19 Symptoms at Home If you have possible or confirmed COVID-19: 1. Stay home from work and school. And stay away from other public places. If you must go out, avoid using any kind of public transportation, ridesharing, or taxis. 2. Monitor your symptoms carefully. If your symptoms get worse, call your healthcare provider immediately. 3. Get rest and stay hydrated. 4. If you have a medical appointment, call the healthcare provider ahead of time and tell them that you have or may have COVID-19. 5. For medical emergencies, call 911 and notify the dispatch personnel that you have or may have COVID-19. 6. Cover your cough and sneezes with a tissue or use the inside of your elbow. 7. Wash your hands often with soap and water for at least 20 seconds or clean your hands with an alcohol-based hand sanitizer that contains at least 60% alcohol. 8. As much as possible, stay in a specific room and away from other people in your home. Also, you should use a separate bathroom, if available. If you need to be around other people in or outside of the home, wear a mask. 9. Avoid sharing personal items with other people in your household, like dishes, towels, and bedding. 10. Clean all surfaces that are touched often, like counters, tabletops, and doorknobs. Use household cleaning sprays or wipes according to the label instructions. [email or web address] 08/09/2018 This information is not intended to replace advice given to you by your health care provider. Make sure you discuss any questions you have with your health care provider. Document Revised: 01/11/2019 Document Reviewed: 01/11/2019 Elsevier Patient Education  [place]. What types of side effects do monoclonal antibody drugs cause?  Common side effects  In general, the more common side effects caused by monoclonal antibody drugs include: . Allergic reactions, such as hives or itching . Flu-like signs and  symptoms, including chills, fatigue, fever, and muscle aches and pains . Nausea, vomiting . Diarrhea . Skin rashes . Low blood pressure   The CDC is recommending patients who receive monoclonal antibody treatments wait at least 90 days before being vaccinated.  Currently, there are no data on the safety and efficacy of mRNA COVID-19 vaccines in persons who received monoclonal antibodies or convalescent plasma as part of COVID-19 treatment. Based on the estimated half-life of such therapies as well as evidence suggesting that reinfection is uncommon in the 90 days after initial infection, vaccination should be deferred for at least 90 days, as a precautionary measure until additional information becomes available, to avoid interference of the antibody treatment with vaccine-induced immune responses. Marland Kitchenmon

## 2019-09-06 NOTE — Telephone Encounter (Signed)
FYI

## 2019-09-06 NOTE — Progress Notes (Signed)
  Diagnosis: COVID-19  Physician:Dr Joya Gaskins  Procedure: [facility] Infusion Clinic Med: bamlanivimab\etesevimab infusion - Provided patient with bamlanimivab\etesevimab fact sheet for patients, parents and caregivers prior to infusion.  Complications: No immediate complications noted.  Discharge: Discharged home   Sherry [name], Sherry [name] 09/06/2019

## 2019-09-27 ENCOUNTER — Ambulatory Visit: Payer: Medicare Other | Admitting: Pharmacist

## 2019-09-27 DIAGNOSIS — F329 Major depressive disorder, single episode, unspecified: Secondary | ICD-10-CM

## 2019-09-27 DIAGNOSIS — F32A Depression, unspecified: Secondary | ICD-10-CM

## 2019-09-27 DIAGNOSIS — I10 Essential (primary) hypertension: Secondary | ICD-10-CM

## 2019-09-27 NOTE — Chronic Care Management (AMB) (Addendum)
Chronic Care Management Pharmacy  Name: Sherry [name]  MRN: [id number] DOB: 06/08/1950   Chief Complaint/ HPI  Sherry [name],  69 y.o. , female presents for their Follow-Up CCM visit with the clinical pharmacist via telephone due to COVID-19 Pandemic.  PCP : Sherry Mclean, MD  Their chronic conditions include:  Asthma, Diabetes, Hypertension, Hx of Heart Failure, Hyperlipidemia/Hx of TIA, Depression, Pain  Office Visits: 08/02/19: Visit w/ Dr. Lorelei [name] - FMLA discussion due to shoulder injury. No med changes noted.  07/12/19: Visit w/ Dr. Lorelei [name] - Pt hurt shoulder while at work. Referral to orthopedic surgery. Prescribed hydrocodone/acetaminophen 5-39m. Concern for hypotension, will decrease metoprolol dose by 50%.   07/11/19: CCM Call - Pt concerned with low BP. Recommended tapering off of metoprolol if needed.   07/05/19: Dr. CLorelei Pontagreeable to sending in quetiapine 214mdaily and sertraline 2577maily to simplify pt's regimen.   Consult Visit: 09/06/19: [facility]  09/04/19: [facility] - COVID 19 Positive. Discharged to home.   Medications: Outpatient Encounter Medications as of 09/27/2019  Medication Sig Note   cetirizine (ZYRTEC) 10 MG tablet Take 1 tablet (10 mg total) by mouth daily.    clopidogrel (PLAVIX) 75 MG tablet Take 1 tablet (75 mg total) by mouth daily.    famotidine (PEPCID) 20 MG tablet Take 20 mg by mouth daily.     furosemide (LASIX) 20 MG tablet TAKE 1 TABLET (20 MG TOTAL) BY MOUTH DAILY.    gabapentin (NEURONTIN) 300 MG capsule TAKE 2 CAPSULES BY MOUTH TWO TIMES DAILY *NEED OFFICE VISIT*    ipratropium (ATROVENT) 0.03 % nasal spray PLACE 2 SPRAYS INTO THE NOSE 4 (FOUR) TIMES DAILY.    JARDIANCE 10 MG TABS tablet TAKE 1 TABLET BY MOUTH DAILY (Patient taking differently: Take 5 mg by mouth daily. ) 06/21/2019: Patient taking 1/2 tab due to 1 tab causing too much  urination   lisinopril (ZESTRIL) 20 MG tablet TAKE 1 TABLET BY MOUTH ONCE DAILY    metFORMIN (GLUCOPHAGE) 500 MG tablet TAKE 2 TABLETS BY MOUTH TWO TIMES DAILY    metoprolol tartrate (LOPRESSOR) 25 MG tablet TAKE 1 TABLET BY MOUTH TWICE DAILY (Patient taking differently: Take 12.5 mg by mouth 2 (two) times daily. )    Multiple Vitamins-Minerals (MULTIVITAMIN PO) Take 2 tablets by mouth daily.    oxyCODONE-acetaminophen (PERCOCET/ROXICET) 5-325 MG tablet Take 0.5 tablets by mouth every 4 (four) hours as needed for severe pain (pain).    QUEtiapine (SEROQUEL) 25 MG tablet Take 1 tablet (25 mg total) by mouth at bedtime.    sertraline (ZOLOFT) 25 MG tablet Take 1 tablet (25 mg total) by mouth daily.    TRADJENTA 5 MG TABS tablet TAKE 1/2 TABLET BY MOUTH DAILY *NEED OFFICE VISIT*    blood glucose meter kit and supplies KIT Dispense based on patient and insurance preference. Use up to four times daily as directed. (FOR ICD-9 250.00, 250.01). Pt uses once a Sherry [name]    fluticasone (FLOVENT HFA) 220 MCG/ACT inhaler Inhale 1 puff into the lungs 2 (two) times daily. May increase to 2 puffs if needed    glucose blood (ACCU-CHEK AVIVA PLUS) test strip Use as instructed    Lancets Misc. (ACCU-CHEK SOFTCLIX LANCET DEV) KIT USE AS DIRECTED    methocarbamol (ROBAXIN) 500 MG tablet Take 1 tablet (500 mg total) by mouth 3 (three) times daily. (Patient not taking: Reported on 09/27/2019) 09/27/2019: States this  is like "drinking water" just hurts her stomach   No facility-administered encounter medications on file as of 09/27/2019.   SDOH Screenings   Alcohol Screen:    Last Alcohol Screening Score (AUDIT): Not on file  Depression (PHQ2-9): Low Risk    PHQ-2 Score: 0  Financial Resource Strain: Low Risk    Difficulty of Paying Living Expenses: Not hard at all  Food Insecurity:    Worried About Sherry [name] in the Last Year: Not on file   [employer] of Food in the Last Year: Not on file  Housing: [place] Risk Score: 0  Physical Activity:    Days of Exercise per Week: Not on file   Minutes of Exercise per Session: Not on file  Social Connections:    Frequency of Communication with Friends and Family: Not on file   Frequency of Social Gatherings with Friends and Family: Not on file   Attends Religious Services: Not on file   Active Member of Clubs or Organizations: Not on file   Attends Archivist Meetings: Not on file   Marital Status: Not on file  Stress:    Feeling of Stress : Not on file  Tobacco Use: Low Risk    Smoking Tobacco Use: Never Smoker   Smokeless Tobacco Use: Never Used  Transportation Needs: No Data processing manager (Medical): No   Lack of Transportation (Non-Medical): No     Current Diagnosis/Assessment:  Goals Addressed             This Visit's Progress    Chronic Care Management Pharmacy Care Plan       CARE PLAN ENTRY (see longitudinal plan of care for additional care plan information)  Current Barriers:  Chronic Disease Management support, education, and care coordination needs related to Asthma, Diabetes, Hypertension, Hx of Heart Failure, Hyperlipidemia/Hx of TIA, Depression, Pain   Hypertension BP Readings from Last 3 Encounters:  09/06/19 (!) 107/52  09/04/19 (!) 116/61  08/02/19 120/68   Pharmacist Clinical Goal(s): Over the next 90 days, patient will work with PharmD and providers to maintain BP goal <140/90 Current regimen:  Lisinopril 29m daily Metoprolol 238m1/2 tab twice daily Interventions: Requested patient to check her BP 3-4 times per week and reco Patient self care activities - Over the next 90 days, patient will: Check BP 3-4 times per week, document, and provide at future appointments Ensure daily salt intake < 2300 mg/Sherry [name]  Hyperlipidemia Lab Results  Component Value Date/Time   LDLCALC 56 04/16/2019 03:56 PM   Pharmacist Clinical Goal(s): Over the next 90 days, patient  will work with PharmD and providers to maintain LDL goal < 70 Current regimen:  Diet and exercise management   Interventions: Consider initiation of rosuvastatin 1073maily to help reduce risk of future TIA, stroke, or heart attack Patient self care activities - Over the next 90 days, patient will: Maintain LDL goal <70  Diabetes Lab Results  Component Value Date/Time   HGBA1C 6.2 04/16/2019 03:56 PM   HGBA1C 6.1 02/15/2018 10:24 AM   Pharmacist Clinical Goal(s): Over the next 90 days, patient will work with PharmD and providers to maintain A1c goal <7% Current regimen:  Tradjenta 5mg47m2 tab daily Jardiance 10mg43m tab daily  Metformin 500mg 55mwice daily Interventions: Discussed possibility of deprescribing noting patient a1c has ben <6.5% at last two lab draws Patient self care activities - Over the next 90  days, patient will: Maintain diabetes medication regimen  Depression Pharmacist Clinical Goal(s) Over the next 90 days, patient will work with PharmD and providers to reduce symptoms of depression and simplify medication regimen Current regimen:  Quetiapine 67m daily Sertraline 255mdaily Interventions: Collaboration with provider regarding medication management (quetiapine 2563mnd sertraline 64m9mily to replace current regimen) Patient self care activities - Over the next 90 days, patient will: Maintain depression medication reigmen  Medication management Pharmacist Clinical Goal(s): Over the next 90 days, patient will work with PharmD and providers to achieve optimal medication adherence [facility] Interventions Comprehensive medication review performed. Utilize [facility] pharmacy for medication synchronization, packaging and delivery Patient self care activities - Over the next 90 days, patient will: Focus on medication adherence by filling medications appropriately  Take medications as prescribed Report any questions or  concerns to PharmD and/or provider(s)  Please see past updates related to this goal by clicking on the "Past Updates" button in the selected goal         Social Hx Widowed. Husband died in 201723-Feb-2017 to cardiac arrest (significant situation).  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 Has 2 sons and 2 daughters. Has 1 son that is deceased (had two children). Has 17 grands. 12-13 great grandchildren.  Covid    Patient has failed these meds in past: None noted  Patient is currently uncontrolled on the following medications:  Feels like she can't breathe well. Plans to go to ED after this visit.  Reports she felt worse after receiving infusion for covid. Has lost about 18lbs.  Has been coughing for 3 weeks. Can hear patient coughing during call. Encouraged her to go to to the ED if she experiencing SOB. Not running a fever currently. Can't find pulse ox today.  Yesterday pulse ox was at 84.  Just got her appetite back 2 days ago.   Plan -Continue monitoring pulse ox and temp daily -Report to ED if any SOB develops  Asthma / Tobacco   Last spirometry score: 08/06/2014 FVC: 1.9 80% FEV1: 1.7 91% FEV1/FVC: 90%  Eosinophil count:   Lab Results  Component Value Date/Time   EOSPCT 3 09/04/2019 02:43 PM  %                               Eos (Absolute):  Lab Results  Component Value Date/Time   EOSABS 0.2 09/04/2019 02:43 PM    Tobacco Status:  Social History   Tobacco Use  Smoking Status Never Smoker  Smokeless Tobacco Never Used    Patient has failed these meds in past: None noted  Patient is currently controlled on the following medications: Flovent 220mc27mpuffs twice daily Using maintenance inhaler regularly? No Frequency of rescue inhaler use:   infrequently (hasn't used in a couple of months)  Update 09/27/19 Requesting refill for albuterol and flovent. Will coordinate with pcp.  Plan -Obtain refills for flovent and albuterol. -Continue current medications  Diabetes   A1c goal <7%  Recent Relevant Labs: Lab Results  Component Value Date/Time   HGBA1C 6.2 04/16/2019 03:56 PM   HGBA1C 6.1 02/15/2018 10:24 AM   Checking BG: Rarely  Recent FBG Readings: "Never over 100 or 120" Patient is  currently controlled on the following medications:  Tradjenta 53m 1/2 tab daily Jardiance 128m1/2 tab daily (states she will be in the bathroom too much if she takes a whole tablet) Metformin 50067m2 twice daily  Jardiance: States she has taken a half tab about a week after being prescribed. "Not over $10"  Last diabetic Eye exam: No results found for: HMDIABEYEEXA (Pt reports she was last seen by Dr. PetAlois Cliche2021) Last diabetic Foot exam: No results found for: HMDIABFOOTEX (04/16/19 w/ Dr. CopLorelei [name]"I've lost a lot of weight"  I really don't want to change anything. I don't want to end up in the hospital  We discussed:  Possibility of deprescribing for her DM regimen, but patient is happy with her regimen, and feels uneasy about changing    Update 09/27/19 Patient has not been eating as much due to lack of appetite with covid. She wasn't taking her medications when she was not eating.  Plan -Continue current medications   Future Plan -Consider D/C Tradjenta if a1c remains <6.5%   Hypertension   BP Goal <140/90  Office blood pressures are  BP Readings from Last 3 Encounters:  09/06/19 (!) 107/52  09/04/19 (!) 116/61  08/02/19 120/68    Patient has failed these meds in the past: None noted  Patient is currently uncontrolled on the following medications:  Lisinopril 25m19mily Metoprolol 25mg35m tab twice daily (decreased at visit w/ Dr. CoplaLorelei [name]/3/21)  Patient checks BP at home infrequently  Patient home  BP readings are ranging: 130/70s. Feels like her HR is in the 50s when she is really tired  Wonders about flutter and low heart rate  Update 09/27/19 Has not been checking frequently.  BP on phone today was 131/70 pulse 75.   Plan -Check blood pressure and heart rate 3-4 times per week and record -Continue current medications    Depression    Patient has failed these meds in past: None noted  Patient is currently controlled on the following medications:  Quetiapine 25mg 5my Sertraline 25mg d74m  She tried to go without one of them and it threw her off Quetiapine: Has 11 tabs remaining (44 Taivon Haroon supply) Sertraline: Has 8 tabs remaining (16 Alle Difabio supply)  Patient would like to receive 3 month supply  PHQ2: 0  We discussed:   Getting her 25mg ta64mf quetiapine and sertraline    Update 09/27/19 Patient received updated prescriptions. States she hasn't taken sertraline or quetiapine in 3 weeks. She states she doesn't feel angry, but she feels like she can't sleep. She thinks the insomnia could be due to her coffee and tea  Plan -Continue current medications  Medication Management   [facility]   Reports she went without plavix for 7 days and started taking a 325mg as 69macement. Now has plavix.  We discussed:  She needs refills sent to pharmacy for  Atrovent nasal spray Albuterol nebs (not on med list, but states she has a neb machine, we discussed nebs may not be recommended in the setting of covid due to aerolization) Albuterol inhaler with spacer (will request this as substitute for neb?)   Plan -Utilize [facility] for medication synchronization, packaging and delivery  Verbal consent obtained for [facility] enhanced pharmacy services (medication synchronization, adherence packaging, delivery coordination). A medication sync plan was created to allow patient  to get all  medications delivered once every 30 to 90 days per patient preference. Patient understands they have freedom to choose pharmacy and clinical pharmacist will coordinate care between all prescribers and [facility].  Follow up: 2 month phone visit   Meds to D/C from list  Methocarbamol (ineffective) Docusate (no longer needed; has diarrhea daily now)

## 2019-09-27 NOTE — Patient Instructions (Signed)
Visit Information  Goals Addressed            This Visit's Progress   . Chronic Care Management Pharmacy Care Plan       CARE PLAN ENTRY (see longitudinal plan of care for additional care plan information)  Current Barriers:  . Chronic Disease Management support, education, and care coordination needs related to Asthma, Diabetes, Hypertension, Hx of Heart Failure, Hyperlipidemia/Hx of TIA, Depression, Pain   Hypertension BP Readings from Last 3 Encounters:  09/06/19 (!) 107/52  09/04/19 (!) 116/61  08/02/19 120/68   . Pharmacist Clinical Goal(s): o Over the next 90 days, patient will work with PharmD and providers to maintain BP goal <140/90 . Current regimen:   Lisinopril 20mg  daily  Metoprolol 25mg  1/2 tab twice daily . Interventions: o Requested patient to check her BP 3-4 times per week and reco . Patient self care activities - Over the next 90 days, patient will: o Check BP 3-4 times per week, document, and provide at future appointments o Ensure daily salt intake < 2300 mg/Merl Guardino  Hyperlipidemia Lab Results  Component Value Date/Time   LDLCALC 56 04/16/2019 03:56 PM   . Pharmacist Clinical Goal(s): o Over the next 90 days, patient will work with PharmD and providers to maintain LDL goal < 70 . Current regimen:  o Diet and exercise management   . Interventions: o Consider initiation of rosuvastatin 10mg  daily to help reduce risk of future TIA, stroke, or heart attack . Patient self care activities - Over the next 90 days, patient will: o Maintain LDL goal <70  Diabetes Lab Results  Component Value Date/Time   HGBA1C 6.2 04/16/2019 03:56 PM   HGBA1C 6.1 02/15/2018 10:24 AM   . Pharmacist Clinical Goal(s): o Over the next 90 days, patient will work with PharmD and providers to maintain A1c goal <7% . Current regimen:   Tradjenta 5mg  1/2 tab daily  Jardiance 10mg  1/2 tab daily   Metformin 500mg  #2 twice daily . Interventions: o Discussed possibility of  deprescribing noting patient a1c has ben <6.5% at last two lab draws . Patient self care activities - Over the next 90 days, patient will: o Maintain diabetes medication regimen  Depression . Pharmacist Clinical Goal(s) o Over the next 90 days, patient will work with PharmD and providers to reduce symptoms of depression and simplify medication regimen . Current regimen:   Quetiapine 25mg  daily  Sertraline 25mg  daily . Interventions: o Collaboration with provider regarding medication management (quetiapine 25mg  and sertraline 25mg  daily to replace current regimen) . Patient self care activities - Over the next 90 days, patient will: o Maintain depression medication reigmen  Medication management . Pharmacist Clinical Goal(s): o Over the next 90 days, patient will work with PharmD and providers to achieve optimal medication adherence . [facility] . Interventions o Comprehensive medication review performed. o Utilize [facility] for medication synchronization, packaging and delivery . Patient self care activities - Over the next 90 days, patient will: o Focus on medication adherence by filling medications appropriately  o Take medications as prescribed o Report any questions or concerns to PharmD and/or provider(s)  Please see past updates related to this goal by clicking on the "Past Updates" button in the selected goal         The patient verbalized understanding of instructions provided today and agreed to receive a mailed copy of patient instruction and/or educational materials.  Telephone follow up appointment with pharmacy team member  scheduled for: 11/15/2019  De Blanch, PharmD Clinical Pharmacist [facility] at [facility] [phone]

## 2019-09-28 ENCOUNTER — Telehealth: Payer: Self-pay

## 2019-09-28 DIAGNOSIS — F320 Major depressive disorder, single episode, mild: Secondary | ICD-10-CM

## 2019-09-28 DIAGNOSIS — E118 Type 2 diabetes mellitus with unspecified complications: Secondary | ICD-10-CM

## 2019-09-28 DIAGNOSIS — J45901 Unspecified asthma with (acute) exacerbation: Secondary | ICD-10-CM

## 2019-09-28 DIAGNOSIS — R0982 Postnasal drip: Secondary | ICD-10-CM

## 2019-09-28 DIAGNOSIS — I10 Essential (primary) hypertension: Secondary | ICD-10-CM

## 2019-09-28 MED ORDER — ALBUTEROL SULFATE HFA 108 (90 BASE) MCG/ACT IN AERS
2.0000 | INHALATION_SPRAY | Freq: Four times a day (QID) | RESPIRATORY_TRACT | 2 refills | Status: DC | PRN
Start: 2019-09-28 — End: 2020-04-28

## 2019-09-28 MED ORDER — CLOPIDOGREL BISULFATE 75 MG PO TABS: 75.0000 mg | ORAL_TABLET | Freq: Every day | ORAL | 3 refills | Status: DC

## 2019-09-28 MED ORDER — GABAPENTIN 300 MG PO CAPS: ORAL_CAPSULE | ORAL | 3 refills | Status: DC

## 2019-09-28 MED ORDER — EMPAGLIFLOZIN 10 MG PO TABS: 10.0000 mg | ORAL_TABLET | Freq: Every day | ORAL | 3 refills | Status: DC

## 2019-09-28 MED ORDER — LISINOPRIL 20 MG PO TABS: 20.0000 mg | ORAL_TABLET | Freq: Every day | ORAL | 3 refills | Status: DC

## 2019-09-28 MED ORDER — IPRATROPIUM BROMIDE 0.03 % NA SOLN: 2.0000 | Freq: Four times a day (QID) | NASAL | 1 refills | Status: DC

## 2019-09-28 MED ORDER — SERTRALINE HCL 25 MG PO TABS: 25.0000 mg | ORAL_TABLET | Freq: Every day | ORAL | 3 refills | Status: DC

## 2019-09-28 MED ORDER — METFORMIN HCL 500 MG PO TABS: ORAL_TABLET | ORAL | 3 refills | Status: DC

## 2019-09-28 MED ORDER — QUETIAPINE FUMARATE 25 MG PO TABS: 25.0000 mg | ORAL_TABLET | Freq: Every day | ORAL | 3 refills | Status: DC

## 2019-09-28 MED ORDER — LINAGLIPTIN 5 MG PO TABS: ORAL_TABLET | ORAL | 3 refills | Status: DC

## 2019-09-28 MED ORDER — FLUTICASONE PROPIONATE HFA 220 MCG/ACT IN AERO: 1.0000 | INHALATION_SPRAY | Freq: Two times a day (BID) | RESPIRATORY_TRACT | 2 refills | Status: DC

## 2019-09-28 MED ORDER — METOPROLOL TARTRATE 25 MG PO TABS: 25.0000 mg | ORAL_TABLET | Freq: Every day | ORAL | 3 refills | Status: DC

## 2019-09-28 MED ORDER — FUROSEMIDE 20 MG PO TABS: 20.0000 mg | ORAL_TABLET | Freq: Every day | ORAL | 3 refills | Status: DC

## 2019-09-28 NOTE — Telephone Encounter (Signed)
Ok- [facility]

## 2019-09-28 NOTE — Addendum Note (Signed)
Addended by: [name] C on: 09/28/2019 05:29 PM   Modules accepted: Orders

## 2019-09-28 NOTE — Telephone Encounter (Signed)
-----   Message from [place], [facility] sent at 09/27/2019 10:25 PM EDT ----- Regarding: Refills of all meds to [facility] 09/28/19 [name]!   This patient has decided to fill with [facility]. Would you be willing to send 90 DS with appropriate refills for all of her medications. There may be a couple that Dr. Lorelei Pont might have to modify.   Dr. Donnamarie Rossetti Jardiance 10mg  (Patient taking 1/2 tab vs 1 tab, please see if Dr. Lorelei Pont would be willing to change it to this. Pt's a1c has consistently been <6.5% and this is the dose the patient has been taking)  Albuterol inhaler (Don't see this on pt's med list, but she would like to have one, may have to be determined by Dr. Lorelei Pont)  Regular Fills Clopidogrel 75mg   Furosemide 20mg  Gabapentin 300mg   Ipratropium 0.03% Spray Lisinopril 20mg   Metoprolol Tartrate 25mg   Quetiapine 25mg   Has about 1 month left Sertraline 25mg   Tradjenta 5mg   Metformin 500mg   Flovent 241mcg   Thank you for your time and help, De Blanch, PharmD Clinical Pharmacist [facility] at [facility] [phone]

## 2019-09-28 NOTE — Telephone Encounter (Signed)
Received message from [facility] the Pharmacist.   She states the patient asked her about the albulteral neb solution, but she is still recovering from covid so she did not if it was the  Best to send in the neb solution since they say the nebs can aerosolize covid.  If she does get the inhaler she would like the spacer to go with it to help her inhale all the medicine properly"   Please advise on what refill would be appropriate. [facility]

## 2019-09-28 NOTE — Telephone Encounter (Signed)
-----   Message from [place], [facility] sent at 09/27/2019 10:25 PM EDT ----- Regarding: Refills of all meds to [facility] 09/28/19 [name]!   This patient has decided to fill with [facility]. Would you be willing to send 90 DS with appropriate refills for all of her medications. There may be a couple that Dr. Lorelei Pont might have to modify.   Dr. Donnamarie Rossetti Jardiance 10mg  (Patient taking 1/2 tab vs 1 tab, please see if Dr. Lorelei Pont would be willing to change it to this. Pt's a1c has consistently been <6.5% and this is the dose the patient has been taking)  Albuterol inhaler (Don't see this on pt's med list, but she would like to have one, may have to be determined by Dr. Lorelei Pont)  Regular Fills Clopidogrel 75mg   Furosemide 20mg  Gabapentin 300mg   Ipratropium 0.03% Spray Lisinopril 20mg   Metoprolol Tartrate 25mg   Quetiapine 25mg   Has about 1 month left Sertraline 25mg   Tradjenta 5mg   Metformin 500mg   Flovent 232mcg   Thank you for your time and help, De Blanch, PharmD Clinical Pharmacist [facility] at [facility] [phone]

## 2019-10-02 ENCOUNTER — Other Ambulatory Visit (HOSPITAL_COMMUNITY): Payer: Self-pay | Admitting: Interventional Radiology

## 2019-10-02 DIAGNOSIS — I671 Cerebral aneurysm, nonruptured: Secondary | ICD-10-CM

## 2019-10-05 DIAGNOSIS — M25511 Pain in right shoulder: Secondary | ICD-10-CM | POA: Diagnosis not present

## 2019-10-22 ENCOUNTER — Other Ambulatory Visit: Payer: Self-pay

## 2019-10-22 ENCOUNTER — Ambulatory Visit (HOSPITAL_COMMUNITY)
Admission: RE | Admit: 2019-10-22 | Discharge: 2019-10-22 | Disposition: A | Discharge status: Home / Self Care | Payer: Medicare Other | Source: Ambulatory Visit | Attending: Interventional Radiology | Admitting: Interventional Radiology

## 2019-10-22 DIAGNOSIS — I63511 Cerebral infarction due to unspecified occlusion or stenosis of right middle cerebral artery: Secondary | ICD-10-CM | POA: Diagnosis not present

## 2019-10-22 DIAGNOSIS — I671 Cerebral aneurysm, nonruptured: Secondary | ICD-10-CM

## 2019-10-22 MED ORDER — GADOBUTROL 1 MMOL/ML IV SOLN
8.0000 mL | Freq: Once | INTRAVENOUS | Status: AC | PRN
  Administered 2019-10-22: 8 mL via INTRAVENOUS

## 2019-10-23 ENCOUNTER — Encounter: Payer: Self-pay | Admitting: Family

## 2019-10-23 ENCOUNTER — Telehealth (HOSPITAL_COMMUNITY): Payer: Self-pay

## 2019-10-23 NOTE — Telephone Encounter (Signed)
Called pt regarding recent mri/mra, no answer, left vm. AW

## 2019-10-23 NOTE — Telephone Encounter (Signed)
Pt agreed to f/u in 1 year with mra head wo. AW  

## 2019-10-26 ENCOUNTER — Telehealth: Payer: Self-pay | Admitting: Pharmacist

## 2019-10-26 NOTE — Progress Notes (Addendum)
Chronic Care Management Pharmacy Assistant   Name: Sherry [name]  MRN: [id number] DOB: [birth year]  Reason for Encounter: Disease State  Patient Questions:  1.  Have you seen any other providers since your last visit?No   2.  Any changes in your medicines or health?No  PCP : Copland, Gay Filler, MD   Their chronic conditions include:  Asthma, Diabetes, Hypertension, Hx of Heart Failure, Hyperlipidemia/Hx of TIA, Depression, Pain.  NO OV's, Consults, or Hospitalizations since their last CCM visit with the clinical pharmacist.  Allergies:   Allergies  Allergen Reactions   Flu Virus Vaccine Anaphylaxis   Influenza Vaccines Anaphylaxis   Demerol Other (See Comments)    SEIZURE   Nsaids Other (See Comments)    NAPROXEN, IBUPROFEN, SKELAXIN---  CAUSES PALPITATIONS   Prednisone Other (See Comments)    Caused her glucose to go up to 500 and went to ED   Tramadol Other (See Comments)    Contraindicated with Victoza    Codeine Itching   Mango Flavor [Flavoring Agent] Itching    Medications: Outpatient Encounter Medications as of 10/26/2019  Medication Sig Note   albuterol (VENTOLIN HFA) 108 (90 Base) MCG/ACT inhaler Inhale 2 puffs into the lungs every 6 (six) hours as needed for wheezing or shortness of breath.    blood glucose meter kit and supplies KIT Dispense based on patient and insurance preference. Use up to four times daily as directed. (FOR ICD-9 250.00, 250.01). Pt uses once a day    cetirizine (ZYRTEC) 10 MG tablet Take 1 tablet (10 mg total) by mouth daily.    clopidogrel (PLAVIX) 75 MG tablet Take 1 tablet (75 mg total) by mouth daily.    empagliflozin (JARDIANCE) 10 MG TABS tablet Take 1 tablet (10 mg total) by mouth daily. Take 0.5 tablets (5 mg total) daily.    famotidine (PEPCID) 20 MG tablet Take 20 mg by mouth daily.     fluticasone (FLOVENT HFA) 220 MCG/ACT inhaler Inhale 1 puff into the lungs 2 (two) times daily. May increase to 2 puffs if  needed    furosemide (LASIX) 20 MG tablet Take 1 tablet (20 mg total) by mouth daily.    gabapentin (NEURONTIN) 300 MG capsule TAKE 2 CAPSULES BY MOUTH TWO TIMES DAILY    glucose blood (ACCU-CHEK AVIVA PLUS) test strip Use as instructed    ipratropium (ATROVENT) 0.03 % nasal spray Place 2 sprays into the nose 4 (four) times daily.    Lancets Misc. (ACCU-CHEK SOFTCLIX LANCET DEV) KIT USE AS DIRECTED    linagliptin (TRADJENTA) 5 MG TABS tablet TAKE 1/2 TABLET BY MOUTH DAILY *NEED OFFICE VISIT*    lisinopril (ZESTRIL) 20 MG tablet Take 1 tablet (20 mg total) by mouth daily.    metFORMIN (GLUCOPHAGE) 500 MG tablet TAKE 2 TABLETS BY MOUTH TWO TIMES DAILY    methocarbamol (ROBAXIN) 500 MG tablet Take 1 tablet (500 mg total) by mouth 3 (three) times daily. (Patient not taking: Reported on 09/27/2019) 09/27/2019: States this is like "drinking water" just hurts her stomach   metoprolol tartrate (LOPRESSOR) 25 MG tablet Take 1 tablet (25 mg total) by mouth daily.    Multiple Vitamins-Minerals (MULTIVITAMIN PO) Take 2 tablets by mouth daily.    oxyCODONE-acetaminophen (PERCOCET/ROXICET) 5-325 MG tablet Take 0.5 tablets by mouth every 4 (four) hours as needed for severe pain (pain).    QUEtiapine (SEROQUEL) 25 MG tablet Take 1 tablet (25 mg total) by mouth at bedtime.    sertraline (ZOLOFT)  25 MG tablet Take 1 tablet (25 mg total) by mouth daily.    No facility-administered encounter medications on file as of 10/26/2019.    Current Diagnosis: Patient Active Problem List   Diagnosis Date Noted   Traumatic hematoma of left lower leg 09/26/2018   S/P total knee arthroplasty, left 03/29/2017   Spondylosis without myelopathy or radiculopathy, cervical region 03/29/2017   Bilateral carpal tunnel syndrome 03/29/2017   Acute asthma exacerbation 08/19/2016   Cervicalgia 03/23/2016   Vertigo 04/14/2015   Anxiety state 01/22/2015   HLD (hyperlipidemia) 01/22/2015   Type 2 diabetes  mellitus with circulatory disorder ([place]) 01/22/2015   Intracranial vascular stenosis 01/22/2015   Aneurysm ([place])    Headache    Essential hypertension 11/12/2014   Dependent edema 11/12/2014   Peripheral neuropathy 11/12/2014   Depression 11/12/2014   Facial droop    Chest pain    TIA (transient ischemic attack) 11/11/2014   Dyspnea 08/06/2014   Hyperthyroidism 08/06/2014   Meniere's disease 06/07/2014   History of CHF (congestive heart failure) 06/07/2014   Goiter 06/13/2013   Obesity, unspecified 06/13/2013    Goals Addressed   None    Recent Relevant Labs: Lab Results  Component Value Date/Time   HGBA1C 6.2 04/16/2019 03:56 PM   HGBA1C 6.1 02/15/2018 10:24 AM    Kidney Function Lab Results  Component Value Date/Time   CREATININE 0.81 09/04/2019 02:43 PM   CREATININE 0.66 04/16/2019 03:56 PM   CREATININE 0.86 02/09/2014 05:56 PM   CREATININE 0.66 06/13/2013 09:06 AM   GFR 107.68 04/16/2019 03:56 PM   GFRNONAA >60 09/04/2019 02:43 PM   GFRAA >60 09/04/2019 02:43 PM     Current antihyperglycemic regimen:   Tradjenta 60m 1/2 tab daily  Jardiance 17m1/2 tab daily  o Metformin 50035m2 twice daily   What recent interventions/DTPs have been made to improve glycemic control:  o None at this time.   Have there been any recent hospitalizations or ED visits since last visit with CPP? No    Patient reports hypoglycemic symptoms, including Shaky  Patient states she can tell when her BS is low because she feels a little shaky after taking her medication and forgets to eat. Stated it has not happened in the past week.   Patient reports hyperglycemic symptoms, including polyuria. Patient states when she has sugar and takes her medicine she has trouble getting to the restroom on time.   How often are you checking your blood sugar? twice daily    What are your blood sugars ranging?  o Fasting: 110-120 o Bedtime: 130-145   During the week, how  often does your blood glucose drop below 70? Yes. Patient stated a couple of months ago her BS dropped to 49 48d she felt "awful". She quickly remembered to get some orange juice.    Are you checking your feet daily/regularly? Yes. Patient states she pay close attention to her feet daily.  Patient contacted the pharmacy to request a new glucometer. She states her BS machine stopped working two weeks ago. Requesting a prescription for blood glucose meter kit be sent to [facility] and coordinating an acute fill. Expected delivery date is 10-29-2019.  Adherence Review: Is the patient currently on a STATIN medication? No Is the patient currently on ACE/ARB medication? Yes lisinopril Does the patient have >5 day gap between last estimated fill dates? No    Follow-Up:  Pharmacist Review   IveFanny SkatesMAPleasant Valleyarmacist Assistant [phone] by:  De Blanch, PharmD Clinical Pharmacist [facility] at [facility] [phone]

## 2019-10-29 ENCOUNTER — Telehealth: Payer: Self-pay

## 2019-10-29 MED ORDER — ONETOUCH ULTRA VI STRP: ORAL_STRIP | 6 refills | Status: DC

## 2019-10-29 MED ORDER — ONETOUCH DELICA LANCETS 33G MISC: 3 refills | Status: DC

## 2019-10-29 MED ORDER — ONETOUCH ULTRA 2 W/DEVICE KIT: PACK | 0 refills | Status: AC

## 2019-10-29 NOTE — Telephone Encounter (Signed)
Monitor kit sent.

## 2019-10-29 NOTE — Telephone Encounter (Signed)
-----  Message from Lafonda Mosses sent at 10/26/2019  2:27 PM EDT ----- Regarding: Medication Request Good afternoon. Can you please send a prescription for blood glucose meter kit and supplies KIT to [facility] for this patient? Her current Glucometer has stopped working and the last script for it was sent to [facility] on 03-23-18.  Thank you for your help, Billey Gosling.

## 2019-10-31 ENCOUNTER — Telehealth: Payer: Self-pay | Admitting: Pharmacist

## 2019-10-31 NOTE — Progress Notes (Addendum)
Chronic Care Management Pharmacy Assistant   Name: Sherry [name]  MRN: [id number] DOB: [birth year]  Reason for Encounter: Medication Review  Patient Questions:  1.  Have you seen any other providers since your last visit? No  2.  Any changes in your medicines or health? No  PCP : Copland, Gay Filler, MD  Allergies:   Allergies  Allergen Reactions  . Flu Virus Vaccine Anaphylaxis  . Influenza Vaccines Anaphylaxis  . Demerol Other (See Comments)    SEIZURE  . Nsaids Other (See Comments)    NAPROXEN, IBUPROFEN, SKELAXIN---  CAUSES PALPITATIONS  . Prednisone Other (See Comments)    Caused her glucose to go up to 500 and went to ED  . Tramadol Other (See Comments)    Contraindicated with Victoza   . Codeine Itching  . Mango Flavor [Flavoring Agent] Itching    Medications: Outpatient Encounter Medications as of 10/31/2019  Medication Sig Note  . albuterol (VENTOLIN HFA) 108 (90 Base) MCG/ACT inhaler Inhale 2 puffs into the lungs every 6 (six) hours as needed for wheezing or shortness of breath.   . blood glucose meter kit and supplies KIT Dispense based on patient and insurance preference. Use up to four times daily as directed. (FOR ICD-9 250.00, 250.01). Pt uses once a day   . Blood Glucose Monitoring Suppl (ONE TOUCH ULTRA 2) w/Device KIT Use to check glucose up to twice daily. E11.9 dx code   . cetirizine (ZYRTEC) 10 MG tablet Take 1 tablet (10 mg total) by mouth daily.   . clopidogrel (PLAVIX) 75 MG tablet Take 1 tablet (75 mg total) by mouth daily.   . empagliflozin (JARDIANCE) 10 MG TABS tablet Take 1 tablet (10 mg total) by mouth daily. Take 0.5 tablets (5 mg total) daily.   . famotidine (PEPCID) 20 MG tablet Take 20 mg by mouth daily.    . fluticasone (FLOVENT HFA) 220 MCG/ACT inhaler Inhale 1 puff into the lungs 2 (two) times daily. May increase to 2 puffs if needed   . furosemide (LASIX) 20 MG tablet Take 1 tablet (20 mg total) by mouth daily.   Marland Kitchen gabapentin  (NEURONTIN) 300 MG capsule TAKE 2 CAPSULES BY MOUTH TWO TIMES DAILY   . glucose blood (ACCU-CHEK AVIVA PLUS) test strip Use as instructed   . glucose blood (ONETOUCH ULTRA) test strip Use to check glucose up to twice daily. E11.9 dx code   . ipratropium (ATROVENT) 0.03 % nasal spray Place 2 sprays into the nose 4 (four) times daily.   . Lancets Misc. (ACCU-CHEK SOFTCLIX LANCET DEV) KIT USE AS DIRECTED   . linagliptin (TRADJENTA) 5 MG TABS tablet TAKE 1/2 TABLET BY MOUTH DAILY *NEED OFFICE VISIT*   . lisinopril (ZESTRIL) 20 MG tablet Take 1 tablet (20 mg total) by mouth daily.   . metFORMIN (GLUCOPHAGE) 500 MG tablet TAKE 2 TABLETS BY MOUTH TWO TIMES DAILY   . methocarbamol (ROBAXIN) 500 MG tablet Take 1 tablet (500 mg total) by mouth 3 (three) times daily. (Patient not taking: Reported on 09/27/2019) 09/27/2019: States this is like "drinking water" just hurts her stomach  . metoprolol tartrate (LOPRESSOR) 25 MG tablet Take 1 tablet (25 mg total) by mouth daily.   . Multiple Vitamins-Minerals (MULTIVITAMIN PO) Take 2 tablets by mouth daily.   [name] Delica Lancets 63K MISC Use to check glucose up to twice daily. E11.9 dx code   . oxyCODONE-acetaminophen (PERCOCET/ROXICET) 5-325 MG tablet Take 0.5 tablets by mouth every 4 (four)  hours as needed for severe pain (pain).   . QUEtiapine (SEROQUEL) 25 MG tablet Take 1 tablet (25 mg total) by mouth at bedtime.   . sertraline (ZOLOFT) 25 MG tablet Take 1 tablet (25 mg total) by mouth daily.    No facility-administered encounter medications on file as of 10/31/2019.    Current Diagnosis: Patient Active Problem List   Diagnosis Date Noted  . Traumatic hematoma of left lower leg 09/26/2018  . S/P total knee arthroplasty, left 03/29/2017  . Spondylosis without myelopathy or radiculopathy, cervical region 03/29/2017  . Bilateral carpal tunnel syndrome 03/29/2017  . Acute asthma exacerbation 08/19/2016  . Cervicalgia 03/23/2016  . Vertigo 04/14/2015    . Anxiety state 01/22/2015  . HLD (hyperlipidemia) 01/22/2015  . Type 2 diabetes mellitus with circulatory disorder ([place]) 01/22/2015  . Intracranial vascular stenosis 01/22/2015  . Aneurysm ([place])   . Headache   . Essential hypertension 11/12/2014  . Dependent edema 11/12/2014  . Peripheral neuropathy 11/12/2014  . Depression 11/12/2014  . Facial droop   . Chest pain   . TIA (transient ischemic attack) 11/11/2014  . Dyspnea 08/06/2014  . Hyperthyroidism 08/06/2014  . Meniere's disease 06/07/2014  . History of CHF (congestive heart failure) 06/07/2014  . Goiter 06/13/2013  . Obesity, unspecified 06/13/2013    Goals Addressed   None    Reviewed chart for medication changes ahead of medication coordination call.  No OVs, Consults, or hospital visits since last care coordination call  No medication changes indicated.  BP Readings from Last 3 Encounters:  09/06/19 (!) 107/52  09/04/19 (!) 116/61  08/02/19 120/68    Lab Results  Component Value Date   HGBA1C 6.2 04/16/2019     Patient obtains medications through Adherence Packaging  30 Days   Last adherence delivery included:   Flovent Inhaler; one puff into lugs twice a day  Tradjenta 5 mg tab; one half with Breakfast  Quetiapine 25 mg tab; at Bedtime  Furosemide 20 mg tab; with Breakfast  Metformin 500 mg tab; two with Breakfast, two with Dinner  Metoprolol Tar 25 mg tab; one half with Breakfast, one half at Bedtime  Lisinopril 20 mg tab; one with Breakfast  Jardiance 10 mg tab; one half with Breakfast  Clopidogrel 75 mg tab; one with Breakfast  Gabapentin 300 mg cap; two with Breakfast, two at Bedtime  Sertraline 25 mg tab; one at Bedtime  One Touch Test Strips  One Touch Lancets  Patient did not declined any medicine last month.   Patient is due for next adherence delivery on: 11-07-2019. Called patient and reviewed medications and coordinated delivery.  This delivery to include:  Flovent  Inhaler; one puff into lugs twice a day  Tradjenta 5 mg tab; one half with Breakfast  Quetiapine 25 mg tab; at Bedtime  Furosemide 20 mg tab; with Breakfast  Metformin 500 mg tab; two with Breakfast, two with Dinner  Metoprolol Tar 25 mg tab; one half with Breakfast, one half at Bedtime  Lisinopril 20 mg tab; one with Breakfast  Jardiance 10 mg tab; one half with Breakfast  Clopidogrel 75 mg tab; one with Breakfast  Gabapentin 300 mg cap; two with Breakfast, two at Bedtime  Sertraline 25 mg tab; one at Bedtime  Patient declined the following medications due to having enough on hand: One Touch Lancets One Touch Test Strips  Patient does not need refills at this time .  Confirmed delivery date of 11-07-2019, advised patient that pharmacy will contact them  the morning of delivery.  Follow-Up:  Coordination of Enhanced Pharmacy Services   Fanny Skates, [place] Pharmacist Assistant [phone]  Reviewed by: De Blanch, PharmD Clinical Pharmacist [facility] at [facility] [phone]

## 2019-11-15 ENCOUNTER — Telehealth: Payer: Medicare Other

## 2019-11-15 NOTE — Chronic Care Management (AMB) (Deleted)
Chronic Care Management Pharmacy  Name: Sherry [name]  MRN: [id number] DOB: March 31, 1950   Chief Complaint/ HPI  Sherry [name],  69 y.o. , female presents for their Follow-Up CCM visit with the clinical pharmacist via telephone due to COVID-19 Pandemic.  PCP : Darreld Mclean, MD  Their chronic conditions include:  Asthma, Diabetes, Hypertension, Hx of Heart Failure, Hyperlipidemia/Hx of TIA, Depression, Pain  Office Visits: None since last CCM visit on 09/27/19.   Consult Visit: None since last CCM visit on 09/27/19.  Medications: Outpatient Encounter Medications as of 11/15/2019  Medication Sig Note  . albuterol (VENTOLIN HFA) 108 (90 Base) MCG/ACT inhaler Inhale 2 puffs into the lungs every 6 (six) hours as needed for wheezing or shortness of breath.   . blood glucose meter kit and supplies KIT Dispense based on patient and insurance preference. Use up to four times daily as directed. (FOR ICD-9 250.00, 250.01). Pt uses once a Audry Pecina   . Blood Glucose Monitoring Suppl (ONE TOUCH ULTRA 2) w/Device KIT Use to check glucose up to twice daily. E11.9 dx code   . cetirizine (ZYRTEC) 10 MG tablet Take 1 tablet (10 mg total) by mouth daily.   . clopidogrel (PLAVIX) 75 MG tablet Take 1 tablet (75 mg total) by mouth daily.   . empagliflozin (JARDIANCE) 10 MG TABS tablet Take 1 tablet (10 mg total) by mouth daily. Take 0.5 tablets (5 mg total) daily.   . famotidine (PEPCID) 20 MG tablet Take 20 mg by mouth daily.    . fluticasone (FLOVENT HFA) 220 MCG/ACT inhaler Inhale 1 puff into the lungs 2 (two) times daily. May increase to 2 puffs if needed   . furosemide (LASIX) 20 MG tablet Take 1 tablet (20 mg total) by mouth daily.   Marland Kitchen gabapentin (NEURONTIN) 300 MG capsule TAKE 2 CAPSULES BY MOUTH TWO TIMES DAILY   . glucose blood (ACCU-CHEK AVIVA PLUS) test strip Use as instructed   . glucose blood (ONETOUCH ULTRA) test strip Use to check glucose up to twice daily. E11.9 dx code   .  ipratropium (ATROVENT) 0.03 % nasal spray Place 2 sprays into the nose 4 (four) times daily.   . Lancets Misc. (ACCU-CHEK SOFTCLIX LANCET DEV) KIT USE AS DIRECTED   . linagliptin (TRADJENTA) 5 MG TABS tablet TAKE 1/2 TABLET BY MOUTH DAILY *NEED OFFICE VISIT*   . lisinopril (ZESTRIL) 20 MG tablet Take 1 tablet (20 mg total) by mouth daily.   . metFORMIN (GLUCOPHAGE) 500 MG tablet TAKE 2 TABLETS BY MOUTH TWO TIMES DAILY   . methocarbamol (ROBAXIN) 500 MG tablet Take 1 tablet (500 mg total) by mouth 3 (three) times daily. (Patient not taking: Reported on 09/27/2019) 09/27/2019: States this is like "drinking water" just hurts her stomach  . metoprolol tartrate (LOPRESSOR) 25 MG tablet Take 1 tablet (25 mg total) by mouth daily.   . Multiple Vitamins-Minerals (MULTIVITAMIN PO) Take 2 tablets by mouth daily.   [name] Delica Lancets 01B MISC Use to check glucose up to twice daily. E11.9 dx code   . oxyCODONE-acetaminophen (PERCOCET/ROXICET) 5-325 MG tablet Take 0.5 tablets by mouth every 4 (four) hours as needed for severe pain (pain).   . QUEtiapine (SEROQUEL) 25 MG tablet Take 1 tablet (25 mg total) by mouth at bedtime.   . sertraline (ZOLOFT) 25 MG tablet Take 1 tablet (25 mg total) by mouth daily.    No facility-administered encounter medications on file as of 11/15/2019.   SDOH Screenings  Alcohol Screen:   . Last Alcohol Screening Score (AUDIT): Not on file  Depression (PHQ2-9): Low Risk   . PHQ-2 Score: 0  Financial Resource Strain: Low Risk   . Difficulty of Paying Living Expenses: Not hard at all  Food Insecurity:   . Worried About Charity fundraiser in the Last Year: Not on file  . Ran Out of Food in the Last Year: Not on file  Housing: Low Risk   . Last Housing Risk Score: 0  Physical Activity:   . Days of Exercise per Week: Not on file  . Minutes of Exercise per Session: Not on file  Social Connections:   . Frequency of Communication with Friends and Family: Not on file  .  Frequency of Social Gatherings with Friends and Family: Not on file  . Attends Religious Services: Not on file  . Active Member of Clubs or Organizations: Not on file  . Attends Archivist Meetings: Not on file  . Marital Status: Not on file  Stress:   . Feeling of Stress : Not on file  Tobacco Use: Low Risk   . Smoking Tobacco Use: Never Smoker  . Smokeless Tobacco Use: Never Used  Transportation Needs: No Transportation Needs  . Lack of Transportation (Medical): No  . Lack of Transportation (Non-Medical): No     Current Diagnosis/Assessment:  Goals Addressed   None    Social Hx Widowed. Husband died in 04-22-15 due to cardiac arrest (significant situation).  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 Has 2 sons and 2 daughters. Has 1 son that is deceased (had two children). Has 17 grands. 12-13 great grandchildren.  Covid    Patient has failed these meds in past: None noted  Patient is currently uncontrolled on the following medications:  Feels like she can't breathe well. Plans to go to ED after this visit.  Reports she felt worse after receiving infusion for covid. Has lost about 18lbs.  Has been coughing for 3 weeks. Can hear patient coughing during call. Encouraged her to go to to the ED if she experiencing SOB. Not running a fever currently. Can't find pulse ox today.  Yesterday pulse ox was at 84.  Just got her appetite back 2 days ago.   Plan -Continue monitoring pulse ox and temp daily -Report to ED if any SOB develops  Asthma / Tobacco   Last spirometry score: 08/06/2014 FVC: 1.9 80% FEV1: 1.7 91% FEV1/FVC: 90%  Eosinophil count:   Lab Results  Component Value Date/Time   EOSPCT 3 09/04/2019 02:43 PM  %                                 Eos (Absolute):  Lab Results  Component Value Date/Time   EOSABS 0.2 09/04/2019 02:43 PM    Tobacco Status:  Social History   Tobacco Use  Smoking Status Never Smoker  Smokeless Tobacco Never Used    Patient has failed these meds in past: None noted  Patient is currently controlled on the following medications: Flovent 259mg 2 puffs twice daily Using maintenance inhaler regularly? No Frequency of rescue inhaler use:  infrequently (hasn't used in a couple of months)  Update 09/27/19 Requesting refill for albuterol and  flovent. Will coordinate with pcp.  Plan -Obtain refills for flovent and albuterol. -Continue current medications  Diabetes   A1c goal <7%  Recent Relevant Labs: Lab Results  Component Value Date/Time   HGBA1C 6.2 04/16/2019 03:56 PM   HGBA1C 6.1 02/15/2018 10:24 AM   Checking BG: Rarely  Recent FBG Readings: "Never over 100 or 120" Patient is currently controlled on the following medications:   Tradjenta 30m 1/2 tab daily  Jardiance 198m1/2 tab daily (states she will be in the bathroom too much if she takes a whole tablet)  Metformin 50029m2 twice daily  Jardiance: States she has taken a half tab about a week after being prescribed. "Not over $10"  Last diabetic Eye exam: No results found for: HMDIABEYEEXA (Pt reports she was last seen by Dr. PetAlois Cliche2021) Last diabetic Foot exam: No results found for: HMDIABFOOTEX (04/16/19 w/ Dr. CopLorelei Pont"I've lost a lot of weight"  I really don't want to change anything. I don't want to end up in the hospital  We discussed:  Possibility of deprescribing for her DM regimen, but patient is happy with her regimen, and feels uneasy about changing    Update 09/27/19 Patient has not been eating as much due to lack of appetite with covid. She wasn't taking her medications when she was not eating.  Plan -Continue current medications   Future Plan -Consider D/C Tradjenta if a1c remains  <6.5%   Hypertension   BP Goal <140/90  Office blood pressures are  BP Readings from Last 3 Encounters:  09/06/19 (!) 107/52  09/04/19 (!) 116/61  08/02/19 120/68    Patient has failed these meds in the past: None noted  Patient is currently uncontrolled on the following medications:   Lisinopril 63m89mily  Metoprolol 25mg78m tab twice daily (decreased at visit w/ Dr. CoplaLorelei Pont/3/21)  Patient checks BP at home infrequently  Patient home BP readings are ranging: 130/70s. Feels like her HR is in the 50s when she is really tired  Wonders about flutter and low heart rate  Update 09/27/19 Has not been checking frequently.  BP on phone today was 131/70 pulse 75.   Plan -Check blood pressure and heart rate 3-4 times per week and record -Continue current medications    Depression    Patient has failed these meds in past: None noted  Patient is currently controlled on the following medications:   Quetiapine 25mg 39my  Sertraline 25mg d22m  She tried to go without one of them and it threw her off Quetiapine: Has 11 tabs remaining (44 Zannie Locastro supply) Sertraline: Has 8 tabs remaining (16 Jury Caserta supply)  Patient would like to receive 3 month supply  PHQ2: 0  We discussed:   Getting her 25mg ta43mf quetiapine and sertraline    Update 09/27/19 Patient received updated prescriptions. States she hasn't taken sertraline or quetiapine in 3 weeks. She states she doesn't feel angry, but she feels like she can't sleep. She thinks the insomnia could be due to her coffee and tea  Plan -Continue current medications  [facility] Reviewed patient's [facility] medication and Epic medication profile assuring there are no discrepancies or gaps in therapy. Confirmed all fill dates appropriate and verified with patient that there is a sufficient quantity of all prescribed medications at home. Informed patient to call me any time if needing medications before scheduled deliveries. The next  anticipated medication sync date is 12/05/19.   Follow up: 2 month phone visit  Meds to D/C from list  Methocarbamol (ineffective) Docusate (no longer needed; has diarrhea daily now)  De Blanch, PharmD Clinical Pharmacist [facility] at [facility] [phone]

## 2019-11-21 ENCOUNTER — Telehealth: Payer: Self-pay | Admitting: Neurology

## 2019-11-21 NOTE — Telephone Encounter (Signed)
The patient hasn't been seen since October 2019. She had an appt in July 2021 with Dr Leonie Man for procedural clearance but she canceled it. The pt will need to be seen for this clearance request. I called Judeen Hammans back @ [employer] and LVM advising of this. I left our office number in message for call back.

## 2019-11-21 NOTE — Telephone Encounter (Signed)
Sherry from Dr. Londell Moh office called wanting to speak to RN regarding the clearance form that was faxed over for the pt. Please advise. [phone] ext [phone]

## 2019-11-22 NOTE — Telephone Encounter (Signed)
I called Sherry [name] office back and LVM for [name] Judeen Hammans is out of the office) asking for call back. Left office number in message.

## 2019-11-26 ENCOUNTER — Telehealth: Payer: Self-pay | Admitting: Pharmacist

## 2019-11-26 NOTE — Telephone Encounter (Signed)
Pt called and LVM about getting clearance for her Shoulder Surgery. Pt is needing this before the next available date in Jan. Please advise.

## 2019-11-26 NOTE — Progress Notes (Addendum)
Chronic Care Management Pharmacy Assistant   Name: Sherry [name]  MRN: [id number] DOB: 08-18-1950  Reason for Encounter: Medication Review   PCP : Darreld Mclean, MD  Allergies:   Allergies  Allergen Reactions  . Flu Virus Vaccine Anaphylaxis  . Influenza Vaccines Anaphylaxis  . Demerol Other (See Comments)    SEIZURE  . Nsaids Other (See Comments)    NAPROXEN, IBUPROFEN, SKELAXIN---  CAUSES PALPITATIONS  . Prednisone Other (See Comments)    Caused her glucose to go up to 500 and went to ED  . Tramadol Other (See Comments)    Contraindicated with Victoza   . Codeine Itching  . Mango Flavor [Flavoring Agent] Itching    Medications: Outpatient Encounter Medications as of 11/26/2019  Medication Sig Note  . albuterol (VENTOLIN HFA) 108 (90 Base) MCG/ACT inhaler Inhale 2 puffs into the lungs every 6 (six) hours as needed for wheezing or shortness of breath.   . blood glucose meter kit and supplies KIT Dispense based on patient and insurance preference. Use up to four times daily as directed. (FOR ICD-9 250.00, 250.01). Pt uses once a day   . Blood Glucose Monitoring Suppl (ONE TOUCH ULTRA 2) w/Device KIT Use to check glucose up to twice daily. E11.9 dx code   . cetirizine (ZYRTEC) 10 MG tablet Take 1 tablet (10 mg total) by mouth daily.   . clopidogrel (PLAVIX) 75 MG tablet Take 1 tablet (75 mg total) by mouth daily.   . empagliflozin (JARDIANCE) 10 MG TABS tablet Take 1 tablet (10 mg total) by mouth daily. Take 0.5 tablets (5 mg total) daily.   . famotidine (PEPCID) 20 MG tablet Take 20 mg by mouth daily.    . fluticasone (FLOVENT HFA) 220 MCG/ACT inhaler Inhale 1 puff into the lungs 2 (two) times daily. May increase to 2 puffs if needed   . furosemide (LASIX) 20 MG tablet Take 1 tablet (20 mg total) by mouth daily.   Marland Kitchen gabapentin (NEURONTIN) 300 MG capsule TAKE 2 CAPSULES BY MOUTH TWO TIMES DAILY   . glucose blood (ACCU-CHEK AVIVA PLUS) test strip Use as instructed    . glucose blood (ONETOUCH ULTRA) test strip Use to check glucose up to twice daily. E11.9 dx code   . ipratropium (ATROVENT) 0.03 % nasal spray Place 2 sprays into the nose 4 (four) times daily.   . Lancets Misc. (ACCU-CHEK SOFTCLIX LANCET DEV) KIT USE AS DIRECTED   . linagliptin (TRADJENTA) 5 MG TABS tablet TAKE 1/2 TABLET BY MOUTH DAILY *NEED OFFICE VISIT*   . lisinopril (ZESTRIL) 20 MG tablet Take 1 tablet (20 mg total) by mouth daily.   . metFORMIN (GLUCOPHAGE) 500 MG tablet TAKE 2 TABLETS BY MOUTH TWO TIMES DAILY   . methocarbamol (ROBAXIN) 500 MG tablet Take 1 tablet (500 mg total) by mouth 3 (three) times daily. (Patient not taking: Reported on 09/27/2019) 09/27/2019: States this is like "drinking water" just hurts her stomach  . metoprolol tartrate (LOPRESSOR) 25 MG tablet Take 1 tablet (25 mg total) by mouth daily.   . Multiple Vitamins-Minerals (MULTIVITAMIN PO) Take 2 tablets by mouth daily.   [name] Delica Lancets 40J MISC Use to check glucose up to twice daily. E11.9 dx code   . oxyCODONE-acetaminophen (PERCOCET/ROXICET) 5-325 MG tablet Take 0.5 tablets by mouth every 4 (four) hours as needed for severe pain (pain).   . QUEtiapine (SEROQUEL) 25 MG tablet Take 1 tablet (25 mg total) by mouth at bedtime.   Marland Kitchen  sertraline (ZOLOFT) 25 MG tablet Take 1 tablet (25 mg total) by mouth daily.    No facility-administered encounter medications on file as of 11/26/2019.    Current Diagnosis: Patient Active Problem List   Diagnosis Date Noted  . Traumatic hematoma of left lower leg 09/26/2018  . S/P total knee arthroplasty, left 03/29/2017  . Spondylosis without myelopathy or radiculopathy, cervical region 03/29/2017  . Bilateral carpal tunnel syndrome 03/29/2017  . Acute asthma exacerbation 08/19/2016  . Cervicalgia 03/23/2016  . Vertigo 04/14/2015  . Anxiety state 01/22/2015  . HLD (hyperlipidemia) 01/22/2015  . Type 2 diabetes mellitus with circulatory disorder ([place]) 01/22/2015  .  Intracranial vascular stenosis 01/22/2015  . Aneurysm ([place])   . Headache   . Essential hypertension 11/12/2014  . Dependent edema 11/12/2014  . Peripheral neuropathy 11/12/2014  . Depression 11/12/2014  . Facial droop   . Chest pain   . TIA (transient ischemic attack) 11/11/2014  . Dyspnea 08/06/2014  . Hyperthyroidism 08/06/2014  . Meniere's disease 06/07/2014  . History of CHF (congestive heart failure) 06/07/2014  . Goiter 06/13/2013  . Obesity, unspecified 06/13/2013    Goals Addressed   None    Reviewed chart for medication changes ahead of medication coordination call.  No OVs, Consults, or hospital visits since last care coordination call.  No medication changes indicated.  BP Readings from Last 3 Encounters:  09/06/19 (!) 107/52  09/04/19 (!) 116/61  08/02/19 120/68    Lab Results  Component Value Date   HGBA1C 6.2 04/16/2019     Patient obtains medications through Adherence Packaging  30 Days   Last adherence delivery included:   Flovent Inhaler; one puff into lungs twice a day  Tradjenta 5 mg tab; one half with Breakfast  Quetiapine 25 mg tab; at Bedtime   Furosemide 20 mg tab; with Breakfast  Metformin 500 mg tab; two with Breakfast, two with Dinner  Metoprolol Tar 25 mg tab; one half with Breakfast, one half at Bedtime  Lisinopril 20 mg tab; one with Breakfast  Jardiance 10 mg tab; one half tab with Breakfast  Clopidogrel 75 mg tab; one with Breakfast  Gabapentin 300 mg cap; two with Breakfast, two at Bedtime  Sertraline 25 mg tab; one at Bedtime   Patient declined the following medications last month due to additional supply on hand: One Touch Lancets One Touch Test Strips  Patient is due for next adherence delivery on: 12-03-2019. Called patient and reviewed medications and coordinated delivery.  This delivery to include:  Famotidine 20 mg; one tab with Breakfast  Certavite Senior Vitamin  Cetirizine 10 mg; one tab with  Breakfast  Flovent Inhaler; one puff into lungs twice a day  Tradjenta 5 mg tab; one half with Breakfast  Furosemide 20 mg tab; with Breakfast  Metformin 500 mg tab; two with Breakfast, two with Dinner  Metoprolol Tar 25 mg tab; one half with Breakfast, one half at Bedtime  Lisinopril 20 mg tab; one with Breakfast  Jardiance 10 mg tab; one half tab with Breakfast  Clopidogrel 75 mg tab; one with Breakfast  Gabapentin 300 mg cap; two with Breakfast, two at Bedtime   Patient declined the following medications: Sertraline 25 mg tab; one at Bedtime (no longer taking) Quetiapine 25 mg tab; at Bedtime (no longer taking) One Touch Lancets (has enough on hand) One Touch Test Strips (has enough on hand)  Patient does not need any refills at this time.  Confirmed delivery date of 12-03-2019, advised patient that  pharmacy will contact them the morning of delivery.  Follow-Up:  Coordination of Enhanced Pharmacy Services and Pharmacist Review   Fanny Skates, [place] Pharmacist Assistant [phone]  Reviewed by: De Blanch, PharmD Clinical Pharmacist [facility] [phone]

## 2019-11-27 ENCOUNTER — Telehealth: Payer: Self-pay | Admitting: Pharmacist

## 2019-11-27 NOTE — Progress Notes (Signed)
A user error has taken place: encounter opened in error, closed for administrative reasons.

## 2019-11-28 NOTE — Telephone Encounter (Signed)
Called patient back and rescheduled her with Janett Billow, NP.  Patient was happy with 10/28 @ 1415.  Patient expressed appreciation

## 2019-11-30 DIAGNOSIS — H40013 Open angle with borderline findings, low risk, bilateral: Secondary | ICD-10-CM | POA: Diagnosis not present

## 2019-12-06 ENCOUNTER — Ambulatory Visit: Payer: Self-pay | Admitting: Adult Health

## 2019-12-08 NOTE — Progress Notes (Deleted)
[place] at [facility] [address] [place], [place] [address] [phone]  Date:  12/12/2019   Name:  Sherry [name]   DOB:  [birth year]   MRN:  [id number]  PCP:  Darreld Mclean, MD    Chief Complaint: No chief complaint on file.   History of Present Illness:  Sherry [name] is a 69 y.o. 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 Traumatic hematoma of left lower leg 09/26/2018  . S/P total knee arthroplasty, left 03/29/2017  . Spondylosis without myelopathy or radiculopathy, cervical region 03/29/2017  . Bilateral carpal tunnel syndrome 03/29/2017  . Acute asthma exacerbation 08/19/2016  . Cervicalgia 03/23/2016  . Vertigo 04/14/2015  . Anxiety state 01/22/2015  . HLD (hyperlipidemia) 01/22/2015  . Type 2 diabetes mellitus with circulatory disorder ([place]) 01/22/2015  . Intracranial vascular stenosis 01/22/2015  . Aneurysm ([place])   . Headache   . Essential hypertension 11/12/2014  . Dependent edema 11/12/2014  . Peripheral neuropathy 11/12/2014  . Depression 11/12/2014  . Facial droop   . Chest pain   . TIA (transient ischemic attack) 11/11/2014  . Dyspnea 08/06/2014  . Hyperthyroidism 08/06/2014  . Meniere's disease  06/07/2014  . History of CHF (congestive heart failure) 06/07/2014  . Goiter 06/13/2013  . Obesity, unspecified 06/13/2013    Past Medical History:  Diagnosis Date  . Asthma   . Cerebral aneurysm   . Complication of anesthesia HYPOTENSION INTRAOP W/ HYSTERECTOMY IN 1987--  DID OK W/ TOTAL KNEE IN 2008  . Diabetes mellitus   . DJD (degenerative joint disease)   . Dyslipidemia   . GERD (gastroesophageal reflux disease)   . H/O hiatal hernia   . History of CHF (congestive heart failure) 2010-  RESOLVED  . History of peptic ulcer YRS AGO  . Hypertension   . Meniere's disease   . Mitral valve prolapse occasional palpitations w/ chest discomfort  . OA (osteoarthritis)   . Rotator cuff tear, left   . Stroke [facility])     Past Surgical History:  Procedure Laterality Date  . BUNIONECTOMY  2007   LEFT FOOT  . IR ANGIO INTRA EXTRACRAN SEL COM CAROTID INNOMINATE BILAT MOD SED  03/22/2019  . IR ANGIO VERTEBRAL SEL SUBCLAVIAN INNOMINATE UNI L MOD SED  03/22/2019  . IR ANGIO VERTEBRAL SEL VERTEBRAL UNI R MOD SED  03/22/2019  . IR US GUIDE VASC ACCESS RIGHT  03/22/2019  . LEFT SHOULDER SURGERY  1997   ROTATOR CUFF REPAIR  . SHOULDER ARTHROSCOPY  09/20/2011   Procedure: ARTHROSCOPY SHOULDER;  Surgeon: Johnn Hai, MD;  Location: [facility];  Service: Orthopedics;  Laterality: Left;  SAD AND MINI OPEN ROTATOR CUFF REPAIR    . TOTAL KNEE ARTHROPLASTY  09-12-2006  [facility]   RIGHT KNEE  . TOTAL KNEE ARTHROPLASTY Left 11/30/2017   Procedure: LEFT TOTAL KNEE REPLACEMENT;  Surgeon: [name], MD;  Location: [facility];  Service: Orthopedics;  Laterality: Left;  . TUBAL LIGATION  1976  . TYMPANOPLASTY  1990;   1980  . VAGINAL HYSTERECTOMY  1987    Social History   Tobacco Use  . Smoking status: Never Smoker  . Smokeless tobacco: Never Used  Vaping Use  . Vaping Use: Never used  Substance Use Topics  . Alcohol use: No  . Drug use: No    Family History  Problem Relation Age  of Onset  . Heart disease Mother        in her 51s, heart attack  . Diabetes Mother   . Kidney disease Mother        dialysis  . Thyroid disease Mother   . Aneurysm Mother   . Ovarian cancer Mother   . Breast cancer Mother   . Thyroid disease Sister   . Heart disease Sister   . Lymphoma Father   . Diabetes Father   . Hypertension Father   . Congestive Heart Failure Father   . Thyroid disease Brother   . Thyroid disease Paternal Grandmother   . Heart attack Sister        at age 68  . Stroke Sister   . Hypertension Sister   . Breast cancer Maternal Aunt   . Breast cancer Maternal Grandmother   . Breast cancer Maternal Aunt   . Breast cancer Maternal Aunt   . Other Brother        Musician accident  . Bipolar disorder Son   . Other Son        GSW    Allergies  Allergen Reactions  . Flu Virus Vaccine Anaphylaxis  . Influenza Vaccines Anaphylaxis  . Demerol Other (See Comments)    SEIZURE  . Nsaids Other (See Comments)    NAPROXEN, IBUPROFEN, SKELAXIN---  CAUSES PALPITATIONS  . Prednisone Other (See Comments)    Caused her glucose to go up to 500 and went to ED  . Tramadol Other (See Comments)    Contraindicated with Victoza   . Codeine Itching  . Mango Flavor [Flavoring Agent] Itching    Medication list has been reviewed and updated.  Current Outpatient Medications on File Prior to Visit  Medication Sig Dispense Refill  . albuterol (VENTOLIN HFA) 108 (90 Base) MCG/ACT inhaler Inhale 2 puffs into the lungs every 6 (six) hours as needed for wheezing or shortness of breath. 18 g 2  . blood glucose meter kit and supplies KIT Dispense based on patient and insurance preference. Use up to four times daily as directed. (FOR ICD-9 250.00, 250.01). Pt uses once a day 1 each 0  . Blood Glucose Monitoring Suppl (ONE TOUCH ULTRA 2) w/Device KIT Use to check glucose up to twice daily. E11.9 dx code 1 kit 0  . cetirizine (ZYRTEC) 10 MG tablet Take 1 tablet (10 mg total) by mouth  daily. 90 tablet 3  . clopidogrel (PLAVIX) 75 MG tablet Take 1 tablet (75 mg total) by mouth daily. 90 tablet 3  . empagliflozin (JARDIANCE) 10 MG TABS tablet Take 1 tablet (10 mg total) by mouth daily. Take 0.5 tablets (5 mg total) daily. 90 tablet 3  . famotidine (PEPCID) 20 MG tablet Take 20 mg by mouth daily.     . fluticasone (FLOVENT HFA) 220 MCG/ACT inhaler Inhale 1 puff into the  lungs 2 (two) times daily. May increase to 2 puffs if needed 12 g 2  . furosemide (LASIX) 20 MG tablet Take 1 tablet (20 mg total) by mouth daily. 90 tablet 3  . gabapentin (NEURONTIN) 300 MG capsule TAKE 2 CAPSULES BY MOUTH TWO TIMES DAILY 360 capsule 3  . glucose blood (ACCU-CHEK AVIVA PLUS) test strip Use as instructed 100 each 12  . glucose blood (ONETOUCH ULTRA) test strip Use to check glucose up to twice daily. E11.9 dx code 200 each 6  . ipratropium (ATROVENT) 0.03 % nasal spray Place 2 sprays into the nose 4 (four) times daily. 60 mL 1  . Lancets Misc. (ACCU-CHEK SOFTCLIX LANCET DEV) KIT USE AS DIRECTED 1 kit 0  . linagliptin (TRADJENTA) 5 MG TABS tablet TAKE 1/2 TABLET BY MOUTH DAILY *NEED OFFICE VISIT* 60 tablet 3  . lisinopril (ZESTRIL) 20 MG tablet Take 1 tablet (20 mg total) by mouth daily. 90 tablet 3  . metFORMIN (GLUCOPHAGE) 500 MG tablet TAKE 2 TABLETS BY MOUTH TWO TIMES DAILY 360 tablet 3  . methocarbamol (ROBAXIN) 500 MG tablet Take 1 tablet (500 mg total) by mouth 3 (three) times daily. (Patient not taking: Reported on 09/27/2019) 40 tablet 0  . metoprolol tartrate (LOPRESSOR) 25 MG tablet Take 1 tablet (25 mg total) by mouth daily. 90 tablet 3  . Multiple Vitamins-Minerals (MULTIVITAMIN PO) Take 2 tablets by mouth daily.    [name] Delica Lancets 24M MISC Use to check glucose up to twice daily. E11.9 dx code 200 each 3  . oxyCODONE-acetaminophen (PERCOCET/ROXICET) 5-325 MG tablet Take 0.5 tablets by mouth every 4 (four) hours as needed for severe pain (pain).    . QUEtiapine (SEROQUEL) 25 MG  tablet Take 1 tablet (25 mg total) by mouth at bedtime. (Patient not taking: Reported on 12/03/2019) 90 tablet 3  . sertraline (ZOLOFT) 25 MG tablet Take 1 tablet (25 mg total) by mouth daily. (Patient not taking: Reported on 12/03/2019) 90 tablet 3   No current facility-administered medications on file prior to visit.    Review of Systems:  As per HPI- otherwise negative.   Physical Examination: There were no vitals filed for this visit. There were no vitals filed for this visit. There is no height or weight on file to calculate BMI. Ideal Body Weight:   GEN: no acute distress. HEENT: Atraumatic, Normocephalic.  Ears and Nose: No external deformity. CV: RRR, No M/G/R. No JVD. No thrill. No extra heart sounds. PULM: CTA B, no wheezes, crackles, rhonchi. No retractions. No resp. distress. No accessory muscle use. ABD: S, NT, ND, +BS. No rebound. No HSM. EXTR: No c/c/e PSYCH: Normally interactive. Conversant.    Assessment and Plan: *** This visit occurred during the SARS-CoV-2 public health emergency.  Safety protocols were in place, including screening questions prior to the visit, additional usage of staff PPE, and extensive cleaning of exam room while observing appropriate contact time as indicated for disinfecting solutions.    Signed [name], MD

## 2019-12-10 ENCOUNTER — Other Ambulatory Visit: Payer: Self-pay

## 2019-12-10 ENCOUNTER — Ambulatory Visit: Payer: Medicare Other | Admitting: Adult Health

## 2019-12-10 ENCOUNTER — Encounter: Payer: Self-pay | Admitting: Adult Health

## 2019-12-10 VITALS — BP 126/72 | HR 63 | Ht 61.0 in | Wt 182.0 lb

## 2019-12-10 DIAGNOSIS — Z01818 Encounter for other preprocedural examination: Secondary | ICD-10-CM | POA: Diagnosis not present

## 2019-12-10 DIAGNOSIS — Z8673 Personal history of transient ischemic attack (TIA), and cerebral infarction without residual deficits: Secondary | ICD-10-CM

## 2019-12-10 NOTE — Patient Instructions (Signed)
Cleared to undergo surgical procedure with stopping plavix 3-5 days prior to procedure (or as instructed) with small but acceptable risk for stroke while off Plavix. Please restart immediately after procedure or once cleared by your surgeon.   Continue clopidogrel 75 mg daily  for secondary stroke prevention  Continue to follow up with PCP regarding cholesterol and blood pressure management  Maintain strict control of hypertension with blood pressure goal below 130/90 and cholesterol with LDL cholesterol (bad cholesterol) goal below 70 mg/dL.        Thank you for coming to see [place] at [facility]. I hope we have been able to provide you high quality care today.  You may receive a patient satisfaction survey over the next few weeks. We would appreciate your feedback and comments so that we may continue to improve ourselves and the health of our patients.

## 2019-12-10 NOTE — Progress Notes (Signed)
I agree with the above plan 

## 2019-12-10 NOTE — Progress Notes (Signed)
[facility]  PATIENT: Sherry [name] DOB: 18-Oct-1950   REASON FOR VISIT: Surgical clearance with history of TIA HISTORY FROM: Patient   Chief Complaint  Patient presents with  . Follow-up    tx rm  . Transient Ischemic Attack    Pt is having no new sx or concerns. Pt said she is here for a medical clearance for surgery,      HISTORY OF PRESENT ILLNESS:  Sherry [name] is a 69 y.o. female with history of HTN, DM, HLD, MVP, TIA 11/2014 and 04/2015, intracranial stenosis and right cavernous ICA aneurysm.  Previously seen in office by Dr. Erlinda Hong routine stroke follow-up with prior visit 11/2017.  She returns today to request surgical clearance for right shoulder replacement by Dr. Griffin Basil at [facility] orthopedics.  She has been stable from a stroke standpoint without new or reoccurring stroke/TIA symptoms.  She has remained on Plavix for secondary stroke prevention without side effects.  Blood pressure today 126/72. She does monitor at home and typically 110s/70s. She continues to follow with Dr. Estanislado Pandy for right ICA aneurysm and intracranial stenosis with recent imaging 10/2019 stable appearance. She plans on following up in 6 months for surveillance monitoring.  No concerns at this time.    REVIEW OF SYSTEMS: Full 14 system review of systems performed and notable only for those listed, all others are neg:  Constitutional: neg  Cardiovascular: neg Ear/Nose/Throat: neg  Skin: neg Eyes: neg Respiratory: neg Gastroitestinal: neg  Hematology/Lymphatic: neg  Endocrine: neg Musculoskeletal: R shoulder pain Allergy/Immunology: neg Neurological: neg Psychiatric: neg Sleep : neg   ALLERGIES: Allergies  Allergen Reactions  . Flu Virus Vaccine Anaphylaxis  . Influenza Vaccines Anaphylaxis  . Demerol Other (See Comments)    SEIZURE  . Nsaids Other (See Comments)    NAPROXEN, IBUPROFEN, SKELAXIN---  CAUSES PALPITATIONS  . Prednisone Other (See  Comments)    Caused her glucose to go up to 500 and went to ED  . Tramadol Other (See Comments)    Contraindicated with Victoza   . Codeine Itching  . Mango Flavor [Flavoring Agent] Itching    HOME MEDICATIONS: Outpatient Medications Prior to Visit  Medication Sig Dispense Refill  . albuterol (VENTOLIN HFA) 108 (90 Base) MCG/ACT inhaler Inhale 2 puffs into the lungs every 6 (six) hours as needed for wheezing or shortness of breath. 18 g 2  . blood glucose meter kit and supplies KIT Dispense based on patient and insurance preference. Use up to four times daily as directed. (FOR ICD-9 250.00, 250.01). Pt uses once a day 1 each 0  . Blood Glucose Monitoring Suppl (ONE TOUCH ULTRA 2) w/Device KIT Use to check glucose up to twice daily. E11.9 dx code 1 kit 0  . cetirizine (ZYRTEC) 10 MG tablet Take 1 tablet (10 mg total) by mouth daily. 90 tablet 3  . clopidogrel (PLAVIX) 75 MG tablet Take 1 tablet (75 mg total) by mouth daily. 90 tablet 3  . empagliflozin (JARDIANCE) 10 MG TABS tablet Take 1 tablet (10 mg total) by mouth daily. Take 0.5 tablets (5 mg total) daily. 90 tablet 3  . famotidine (PEPCID) 20 MG tablet Take 20 mg by mouth daily.     . fluticasone (FLOVENT HFA) 220 MCG/ACT inhaler Inhale 1 puff into the lungs 2 (two) times daily. May increase to 2 puffs if needed 12 g 2  . furosemide (LASIX) 20 MG tablet Take 1 tablet (20 mg total) by mouth daily. 90 tablet 3  .  gabapentin (NEURONTIN) 300 MG capsule TAKE 2 CAPSULES BY MOUTH TWO TIMES DAILY 360 capsule 3  . glucose blood (ACCU-CHEK AVIVA PLUS) test strip Use as instructed 100 each 12  . glucose blood (ONETOUCH ULTRA) test strip Use to check glucose up to twice daily. E11.9 dx code 200 each 6  . ipratropium (ATROVENT) 0.03 % nasal spray Place 2 sprays into the nose 4 (four) times daily. 60 mL 1  . Lancets Misc. (ACCU-CHEK SOFTCLIX LANCET DEV) KIT USE AS DIRECTED 1 kit 0  . linagliptin (TRADJENTA) 5 MG TABS tablet TAKE 1/2 TABLET BY MOUTH  DAILY *NEED OFFICE VISIT* 60 tablet 3  . lisinopril (ZESTRIL) 20 MG tablet Take 1 tablet (20 mg total) by mouth daily. 90 tablet 3  . metFORMIN (GLUCOPHAGE) 500 MG tablet TAKE 2 TABLETS BY MOUTH TWO TIMES DAILY 360 tablet 3  . methocarbamol (ROBAXIN) 500 MG tablet Take 1 tablet (500 mg total) by mouth 3 (three) times daily. 40 tablet 0  . metoprolol tartrate (LOPRESSOR) 25 MG tablet Take 1 tablet (25 mg total) by mouth daily. 90 tablet 3  . Multiple Vitamins-Minerals (MULTIVITAMIN PO) Take 2 tablets by mouth daily.    [name] Delica Lancets 93O MISC Use to check glucose up to twice daily. E11.9 dx code 200 each 3  . oxyCODONE-acetaminophen (PERCOCET/ROXICET) 5-325 MG tablet Take 0.5 tablets by mouth every 4 (four) hours as needed for severe pain (pain).    . QUEtiapine (SEROQUEL) 25 MG tablet Take 1 tablet (25 mg total) by mouth at bedtime. (Patient not taking: Reported on 12/03/2019) 90 tablet 3  . sertraline (ZOLOFT) 25 MG tablet Take 1 tablet (25 mg total) by mouth daily. (Patient not taking: Reported on 12/03/2019) 90 tablet 3   No facility-administered medications prior to visit.    PAST MEDICAL HISTORY: Past Medical History:  Diagnosis Date  . Asthma   . Cerebral aneurysm   . Complication of anesthesia HYPOTENSION INTRAOP W/ HYSTERECTOMY IN 1987--  DID OK W/ TOTAL KNEE IN 2008  . Diabetes mellitus   . DJD (degenerative joint disease)   . Dyslipidemia   . GERD (gastroesophageal reflux disease)   . H/O hiatal hernia   . History of CHF (congestive heart failure) 2010-  RESOLVED  . History of peptic ulcer YRS AGO  . Hypertension   . Meniere's disease   . Mitral valve prolapse occasional palpitations w/ chest discomfort  . OA (osteoarthritis)   . Rotator cuff tear, left   . Stroke [facility])     PAST SURGICAL HISTORY: Past Surgical History:  Procedure Laterality Date  . BUNIONECTOMY  2007   LEFT FOOT  . IR ANGIO INTRA EXTRACRAN SEL COM CAROTID INNOMINATE BILAT MOD SED   03/22/2019  . IR ANGIO VERTEBRAL SEL SUBCLAVIAN INNOMINATE UNI L MOD SED  03/22/2019  . IR ANGIO VERTEBRAL SEL VERTEBRAL UNI R MOD SED  03/22/2019  . IR US GUIDE VASC ACCESS RIGHT  03/22/2019  . LEFT SHOULDER SURGERY  1997   ROTATOR CUFF REPAIR  . SHOULDER ARTHROSCOPY  09/20/2011   Procedure: ARTHROSCOPY SHOULDER;  Surgeon: Johnn Hai, MD;  [facility];  Service: Orthopedics;  Laterality: Left;  SAD AND MINI OPEN ROTATOR CUFF REPAIR    . TOTAL KNEE ARTHROPLASTY  09-12-2006  [facility]   RIGHT KNEE  . TOTAL KNEE ARTHROPLASTY Left 11/30/2017   Procedure: LEFT TOTAL KNEE REPLACEMENT;  Surgeon: [name], MD;  Location: [facility];  Service: Orthopedics;  Laterality: Left;  .  TUBAL LIGATION  1976  . TYMPANOPLASTY  1990;   1980  . VAGINAL HYSTERECTOMY  1987    FAMILY HISTORY: Family History  Problem Relation Age of Onset  . Heart disease Mother        in her 28s, heart attack  . Diabetes Mother   . Kidney disease Mother        dialysis  . Thyroid disease Mother   . Aneurysm Mother   . Ovarian cancer Mother   . Breast cancer Mother   . Thyroid disease Sister   . Heart disease Sister   . Lymphoma Father   . Diabetes Father   . Hypertension Father   . Congestive Heart Failure Father   . Thyroid disease Brother   . Thyroid disease Paternal Grandmother   . Heart attack Sister        at age 49  . Stroke Sister   . Hypertension Sister   . Breast cancer Maternal Aunt   . Breast cancer Maternal Grandmother   . Breast cancer Maternal Aunt   . Breast cancer Maternal Aunt   . Other Brother        Musician accident  . Bipolar disorder Son   . Other Son        GSW    SOCIAL HISTORY: Social History   Socioeconomic History  . Marital status: Widowed    Spouse name: Not on file  . Number of children: 4  . Years of education: BA  . Highest education level: Not on file  Occupational History  . Occupation: LPN - retired  Tobacco Use  . Smoking status: Never  Smoker  . Smokeless tobacco: Never Used  Vaping Use  . Vaping Use: Never used  Substance and Sexual Activity  . Alcohol use: No  . Drug use: No  . Sexual activity: Not on file  Other Topics Concern  . Not on file  Social History Narrative  . Not on file   Social Determinants of Health   Financial Resource Strain: Low Risk   . Difficulty of Paying Living Expenses: Not hard at all  Food Insecurity:   . Worried About Charity fundraiser in the Last Year: Not on file  . Ran Out of Food in the Last Year: Not on file  Transportation Needs: No Transportation Needs  . Lack of Transportation (Medical): No  . Lack of Transportation (Non-Medical): No  Physical Activity:   . Days of Exercise per Week: Not on file  . Minutes of Exercise per Session: Not on file  Stress:   . Feeling of Stress : Not on file  Social Connections:   . Frequency of Communication with Friends and Family: Not on file  . Frequency of Social Gatherings with Friends and Family: Not on file  . Attends Religious Services: Not on file  . Active Member of Clubs or Organizations: Not on file  . Attends Archivist Meetings: Not on file  . Marital Status: Not on file  Intimate Partner Violence:   . Fear of Current or Ex-Partner: Not on file  . Emotionally Abused: Not on file  . Physically Abused: Not on file  . Sexually Abused: Not on file     PHYSICAL EXAM  Vitals:   12/10/19 0807  BP: 126/72  Pulse: 63  Weight: 182 lb (82.6 kg)  Height: $Remove'5\' 1"'zKBfoHl$  (1.549 m)   Body mass index is 34.39 kg/m.  Generalized: Well developed, very pleasant middle-age African-American female in  no acute distress  Head: normocephalic and atraumatic Neck: Supple, no carotid bruits  Cardiac: Regular rate rhythm, no murmur  Musculoskeletal: Limited R shoulder ROM 2/2 pain Skin:  no rash/petichiae Vascular:  Normal pulses all extremities   Neurologic Exam Mental Status: Awake and fully alert.   Fluent speech and language.   Oriented to place and time. Recent and remote memory intact. Attention span, concentration and fund of knowledge appropriate. Mood and affect appropriate.  Cranial Nerves: Fundoscopic exam reveals sharp disc margins. Pupils equal, briskly reactive to light. Extraocular movements full without nystagmus. Visual fields full to confrontation. Hearing intact. Facial sensation intact. Face, tongue, palate moves normally and symmetrically.  Motor: Normal bulk and tone. Normal strength in all tested extremity muscles.  Limited testing right deltoid due to pain and decreased ROM Sensory.: intact to touch , pinprick , position and vibratory sensation.  Coordination: Rapid alternating movements normal in all extremities. Finger-to-nose and heel-to-shin performed accurately bilaterally. Gait and Station: Arises from chair without difficulty. Stance is normal. Gait demonstrates normal stride length and balance without use of assistive device Reflexes: 1+ and symmetric. Toes downgoing.    DIAGNOSTIC DATA (LABS, IMAGING, TESTING) - I reviewed patient records, labs, notes, testing and imaging myself where available.  Lab Results  Component Value Date   WBC 5.8 09/04/2019   HGB 13.4 09/04/2019   HCT 42.8 09/04/2019   MCV 91.6 09/04/2019   PLT 196 09/04/2019      Component Value Date/Time   NA 137 09/04/2019 1443   K 3.5 09/04/2019 1443   CL 102 09/04/2019 1443   CO2 23 09/04/2019 1443   GLUCOSE 135 (H) 09/04/2019 1443   BUN 10 09/04/2019 1443   CREATININE 0.81 09/04/2019 1443   CREATININE 0.86 02/09/2014 1756   CALCIUM 8.3 (L) 09/04/2019 1443   PROT 6.6 09/04/2019 1443   ALBUMIN 3.6 09/04/2019 1443   AST 19 09/04/2019 1443   ALT 14 09/04/2019 1443   ALKPHOS 56 09/04/2019 1443   BILITOT 0.5 09/04/2019 1443   GFRNONAA >60 09/04/2019 1443   GFRAA >60 09/04/2019 1443   Lab Results  Component Value Date   CHOL 138 04/16/2019   HDL 63.80 04/16/2019   LDLCALC 56 04/16/2019   TRIG 91.0 04/16/2019    CHOLHDL 2 04/16/2019   Lab Results  Component Value Date   HGBA1C 6.2 04/16/2019   No results found for: [id number] Lab Results  Component Value Date   TSH 1.320 05/16/2019      ASSESSMENT AND PLAN 69 y.o. African American female with PMH of HTN, DM with peripheral neuropathy, CHF, HLD, MVP, hypothyroidism, TIA 11/2014 and 04/2015, intracranial vascular stenosis and right cavernous ICA aneurysm.  She is being seen today, 12/10/2019, for surgical clearance for right reverse total shoulder needing to hold Plavix.  Procedure planned to be scheduled with Dr. Griffin Basil at [facility] orthopedics once cleared to proceed   Cleared to proceed with surgical procedure of right shoulder replacement from a stroke standpoint as she has been stable since her prior TIA and 2017 without new or reoccurring stroke/TIA symptoms.  Discussed holding Plavix for 3 to 5 days or as advised by orthopedic surgeon with small but acceptable preprocedure risk of stroke.  Advised to restart Plavix immediately after procedure or once hemodynamically stable and advised by orthopedic surgeon.  This was fully discussed with patient and she verbalized understanding.   Advised to follow-up on an as-needed basis   CC:  [facility] provider: Dr. Sinda Du, Janett Billow  Loletha Grayer, MD   Ophelia Charter, MD (orthopedic surgeon)  I spent 20 minutes of face-to-face and non-face-to-face time with patient.  This included previsit chart review, lab review, study review, order entry, electronic health record documentation, patient education regarding history of prior TIAs, planned surgical procedure and risk from a stroke standpoint of holding Plavix as discussed above and answered all questions to patient satisfaction   Frann Rider, AGNP-BC  [facility] [address] [place], [place] [address]  Phone [phone] Fax [phone] Note: This document was prepared with digital dictation and possible smart phrase  technology. Any transcriptional errors that result from this process are unintentional.

## 2019-12-12 ENCOUNTER — Ambulatory Visit: Payer: Medicare Other | Admitting: Family Medicine

## 2019-12-12 DIAGNOSIS — Z0289 Encounter for other administrative examinations: Secondary | ICD-10-CM

## 2019-12-26 ENCOUNTER — Telehealth: Payer: Self-pay | Admitting: Pharmacist

## 2019-12-26 NOTE — Progress Notes (Addendum)
Chronic Care Management Pharmacy Assistant   Name: RENNA KILMER  MRN: [id number] DOB: July 11, 1950  Reason for Encounter: Medication Review  PCP : Darreld Mclean, MD  Allergies:   Allergies  Allergen Reactions   Flu Virus Vaccine Anaphylaxis   Influenza Vaccines Anaphylaxis   Demerol Other (See Comments)    SEIZURE   Nsaids Other (See Comments)    NAPROXEN, IBUPROFEN, SKELAXIN---  CAUSES PALPITATIONS   Prednisone Other (See Comments)    Caused her glucose to go up to 500 and went to ED   Tramadol Other (See Comments)    Contraindicated with Victoza    Codeine Itching   Mango Flavor [Flavoring Agent] Itching    Medications: Outpatient Encounter Medications as of 12/26/2019  Medication Sig Note   albuterol (VENTOLIN HFA) 108 (90 Base) MCG/ACT inhaler Inhale 2 puffs into the lungs every 6 (six) hours as needed for wheezing or shortness of breath.    blood glucose meter kit and supplies KIT Dispense based on patient and insurance preference. Use up to four times daily as directed. (FOR ICD-9 250.00, 250.01). Pt uses once a day    Blood Glucose Monitoring Suppl (ONE TOUCH ULTRA 2) w/Device KIT Use to check glucose up to twice daily. E11.9 dx code    cetirizine (ZYRTEC) 10 MG tablet Take 1 tablet (10 mg total) by mouth daily.    clopidogrel (PLAVIX) 75 MG tablet Take 1 tablet (75 mg total) by mouth daily.    empagliflozin (JARDIANCE) 10 MG TABS tablet Take 1 tablet (10 mg total) by mouth daily. Take 0.5 tablets (5 mg total) daily.    famotidine (PEPCID) 20 MG tablet Take 20 mg by mouth daily.     fluticasone (FLOVENT HFA) 220 MCG/ACT inhaler Inhale 1 puff into the lungs 2 (two) times daily. May increase to 2 puffs if needed    furosemide (LASIX) 20 MG tablet Take 1 tablet (20 mg total) by mouth daily.    gabapentin (NEURONTIN) 300 MG capsule TAKE 2 CAPSULES BY MOUTH TWO TIMES DAILY    glucose blood (ACCU-CHEK AVIVA PLUS) test strip Use as instructed    glucose blood  (ONETOUCH ULTRA) test strip Use to check glucose up to twice daily. E11.9 dx code    ipratropium (ATROVENT) 0.03 % nasal spray Place 2 sprays into the nose 4 (four) times daily.    Lancets Misc. (ACCU-CHEK SOFTCLIX LANCET DEV) KIT USE AS DIRECTED    linagliptin (TRADJENTA) 5 MG TABS tablet TAKE 1/2 TABLET BY MOUTH DAILY *NEED OFFICE VISIT*    lisinopril (ZESTRIL) 20 MG tablet Take 1 tablet (20 mg total) by mouth daily.    metFORMIN (GLUCOPHAGE) 500 MG tablet TAKE 2 TABLETS BY MOUTH TWO TIMES DAILY    methocarbamol (ROBAXIN) 500 MG tablet Take 1 tablet (500 mg total) by mouth 3 (three) times daily. 09/27/2019: States this is like "drinking water" just hurts her stomach   metoprolol tartrate (LOPRESSOR) 25 MG tablet Take 1 tablet (25 mg total) by mouth daily.    Multiple Vitamins-Minerals (MULTIVITAMIN PO) Take 2 tablets by mouth daily.    OneTouch Delica Lancets 56Y MISC Use to check glucose up to twice daily. E11.9 dx code    oxyCODONE-acetaminophen (PERCOCET/ROXICET) 5-325 MG tablet Take 0.5 tablets by mouth every 4 (four) hours as needed for severe pain (pain).    No facility-administered encounter medications on file as of 12/26/2019.    Current Diagnosis: Patient Active Problem List   Diagnosis Date Noted  Traumatic hematoma of left lower leg 09/26/2018   S/P total knee arthroplasty, left 03/29/2017   Spondylosis without myelopathy or radiculopathy, cervical region 03/29/2017   Bilateral carpal tunnel syndrome 03/29/2017   Acute asthma exacerbation 08/19/2016   Cervicalgia 03/23/2016   Vertigo 04/14/2015   Anxiety state 01/22/2015   HLD (hyperlipidemia) 01/22/2015   Type 2 diabetes mellitus with circulatory disorder ([place]) 01/22/2015   Intracranial vascular stenosis 01/22/2015   Aneurysm ([place])    Headache    Essential hypertension 11/12/2014   Dependent edema 11/12/2014   Peripheral neuropathy 11/12/2014   Depression 11/12/2014   Facial droop    Chest pain    TIA (transient  ischemic attack) 11/11/2014   Dyspnea 08/06/2014   Hyperthyroidism 08/06/2014   Meniere's disease 06/07/2014   History of CHF (congestive heart failure) 06/07/2014   Goiter 06/13/2013   Obesity, unspecified 06/13/2013    Goals Addressed   None    Reviewed chart for medication changes ahead of medication coordination call.  Consults: 12-10-2019(Neurology) Patient presented in the office with NP McCue for surgical clearance with hx of TIA. Patient is preparing for right reverse total shoulder surgery and needs to hold Plavix. Per the notes, patinet was cleared to undergo surgical procedure with stopping plavix 3-5 days prior to procedure. Advised to restart immediately after the procedure or once cleared by the surgeon. Medication changes QUEtiapine (SEROQUEL) 25 MG tablet and sertraline (ZOLOFT) 25 MG tablet were discontinued.   No OVs or hospital visits since last care coordination call.   Medication changes indicated: Patient reported not taking these meds:SEROQUEL 25 MG tablet and  ZOLOFT 25 MG tablet   BP Readings from Last 3 Encounters:  12/10/19 126/72  09/06/19 (!) 107/52  09/04/19 (!) 116/61    Lab Results  Component Value Date   HGBA1C 6.2 04/16/2019     Patient obtains medications through Adherence Packaging  30 Days   Last adherence delivery included:  Famotidine 20 mg; one tab with Breakfast Certavite Senior Vitamin Cetirizine 10 mg; one tab with Breakfast Flovent Inhaler; one puff into lungs twice a day Tradjenta 5 mg tab; one half with Breakfast Furosemide 20 mg tab; with Breakfast Metformin 500 mg tab; two with Breakfast, two with Dinner Metoprolol Tar 25 mg tab; one half with Breakfast, one half at Bedtime Lisinopril 20 mg tab; one with Breakfast Jardiance 10 mg tab; one half tab with Breakfast Clopidogrel 75 mg tab; one with Breakfast Gabapentin 300 mg cap; two with Breakfast, two at Bedtime  Patient declined the following medications last mont:    Sertraline 25 mg tab; one at Bedtime (no longer taking) Quetiapine 25 mg tab; at Bedtime (no longer taking) One Touch Lancets (has enough on hand) One Touch Test Strips (has enough on hand)  Patient is due for next adherence delivery on: 01-02-2020. Called patient and reviewed medications and coordinated delivery.  This delivery to include: Famotidine 20 mg; one tab with Breakfast Certavite Senior Vitamin Cetirizine 10 mg; one tab with Breakfast Flovent Inhaler; one puff into lungs twice a day Tradjenta 5 mg tab; one half with Breakfast Furosemide 20 mg tab; with Breakfast Metformin 500 mg tab; two with Breakfast, two with Dinner Metoprolol Tar 25 mg tab; one half with Breakfast, one half at Bedtime Lisinopril 20 mg tab; one with Breakfast Jardiance 10 mg tab; one half tab with Breakfast Clopidogrel 75 mg tab; one with Breakfast Gabapentin 300 mg cap; two with Breakfast, two at Bedtime One Touch Test Strips  Patient declined the following medications: One Touch Lancets (has enough on hand) Sertraline 25 mg tab; one at Bedtime (has been d/c'd) Quetiapine 25 mg tab; at Bedtime (has been d/c'd)  Patient does not needs any refills at this time.  Confirmed delivery date of 01-02-2020, advised patient that pharmacy will contact them the morning of delivery.  Follow-Up:  Coordination of Enhanced Pharmacy Services and Pharmacist Review   Fanny Skates, [place] Pharmacist Assistant [phone]  Reviewed by: 13 mins for review and coordination De Blanch, PharmD Clinical Pharmacist [facility] [phone]

## 2020-01-24 ENCOUNTER — Telehealth: Payer: Self-pay | Admitting: Pharmacist

## 2020-01-24 NOTE — Progress Notes (Addendum)
Chronic Care Management Pharmacy Assistant   Name: Sherry [name]  MRN: [id number] DOB: 06/16/1950  Reason for Encounter: Medication Review   PCP : Darreld Mclean, MD  Allergies:   Allergies  Allergen Reactions   Flu Virus Vaccine Anaphylaxis   Influenza Vaccines Anaphylaxis   Demerol Other (See Comments)    SEIZURE   Nsaids Other (See Comments)    NAPROXEN, IBUPROFEN, SKELAXIN---  CAUSES PALPITATIONS   Prednisone Other (See Comments)    Caused her glucose to go up to 500 and went to ED   Tramadol Other (See Comments)    Contraindicated with Victoza    Codeine Itching   Mango Flavor [Flavoring Agent] Itching    Medications: Outpatient Encounter Medications as of 01/24/2020  Medication Sig Note   albuterol (VENTOLIN HFA) 108 (90 Base) MCG/ACT inhaler Inhale 2 puffs into the lungs every 6 (six) hours as needed for wheezing or shortness of breath.    blood glucose meter kit and supplies KIT Dispense based on patient and insurance preference. Use up to four times daily as directed. (FOR ICD-9 250.00, 250.01). Pt uses once a day    Blood Glucose Monitoring Suppl (ONE TOUCH ULTRA 2) w/Device KIT Use to check glucose up to twice daily. E11.9 dx code    cetirizine (ZYRTEC) 10 MG tablet Take 1 tablet (10 mg total) by mouth daily.    clopidogrel (PLAVIX) 75 MG tablet Take 1 tablet (75 mg total) by mouth daily.    empagliflozin (JARDIANCE) 10 MG TABS tablet Take 1 tablet (10 mg total) by mouth daily. Take 0.5 tablets (5 mg total) daily.    famotidine (PEPCID) 20 MG tablet Take 20 mg by mouth daily.     fluticasone (FLOVENT HFA) 220 MCG/ACT inhaler Inhale 1 puff into the lungs 2 (two) times daily. May increase to 2 puffs if needed    furosemide (LASIX) 20 MG tablet Take 1 tablet (20 mg total) by mouth daily.    gabapentin (NEURONTIN) 300 MG capsule TAKE 2 CAPSULES BY MOUTH TWO TIMES DAILY    glucose blood (ACCU-CHEK AVIVA PLUS) test strip Use as instructed    glucose blood  (ONETOUCH ULTRA) test strip Use to check glucose up to twice daily. E11.9 dx code    ipratropium (ATROVENT) 0.03 % nasal spray Place 2 sprays into the nose 4 (four) times daily.    Lancets Misc. (ACCU-CHEK SOFTCLIX LANCET DEV) KIT USE AS DIRECTED    linagliptin (TRADJENTA) 5 MG TABS tablet TAKE 1/2 TABLET BY MOUTH DAILY *NEED OFFICE VISIT*    lisinopril (ZESTRIL) 20 MG tablet Take 1 tablet (20 mg total) by mouth daily.    metFORMIN (GLUCOPHAGE) 500 MG tablet TAKE 2 TABLETS BY MOUTH TWO TIMES DAILY    methocarbamol (ROBAXIN) 500 MG tablet Take 1 tablet (500 mg total) by mouth 3 (three) times daily. 09/27/2019: States this is like "drinking water" just hurts her stomach   metoprolol tartrate (LOPRESSOR) 25 MG tablet Take 1 tablet (25 mg total) by mouth daily.    Multiple Vitamins-Minerals (MULTIVITAMIN PO) Take 2 tablets by mouth daily.    OneTouch Delica Lancets 59X MISC Use to check glucose up to twice daily. E11.9 dx code    oxyCODONE-acetaminophen (PERCOCET/ROXICET) 5-325 MG tablet Take 0.5 tablets by mouth every 4 (four) hours as needed for severe pain (pain).    No facility-administered encounter medications on file as of 01/24/2020.    Current Diagnosis: Patient Active Problem List   Diagnosis Date Noted  Traumatic hematoma of left lower leg 09/26/2018   S/P total knee arthroplasty, left 03/29/2017   Spondylosis without myelopathy or radiculopathy, cervical region 03/29/2017   Bilateral carpal tunnel syndrome 03/29/2017   Acute asthma exacerbation 08/19/2016   Cervicalgia 03/23/2016   Vertigo 04/14/2015   Anxiety state 01/22/2015   HLD (hyperlipidemia) 01/22/2015   Type 2 diabetes mellitus with circulatory disorder ([place]) 01/22/2015   Intracranial vascular stenosis 01/22/2015   Aneurysm ([place])    Headache    Essential hypertension 11/12/2014   Dependent edema 11/12/2014   Peripheral neuropathy 11/12/2014   Depression 11/12/2014   Facial droop    Chest pain    TIA (transient  ischemic attack) 11/11/2014   Dyspnea 08/06/2014   Hyperthyroidism 08/06/2014   Meniere's disease 06/07/2014   History of CHF (congestive heart failure) 06/07/2014   Goiter 06/13/2013   Obesity, unspecified 06/13/2013    Goals Addressed   None    Reviewed chart for medication changes ahead of medication coordination call.  No OVs, Consults, or hospital visits since last care coordination call.   No medication changes indicated   BP Readings from Last 3 Encounters:  12/10/19 126/72  09/06/19 (!) 107/52  09/04/19 (!) 116/61    Lab Results  Component Value Date   HGBA1C 6.2 04/16/2019     Patient obtains medications through Adherence Packaging  30 Days   Last adherence delivery included: Famotidine 20 mg; one tab with Breakfast Certavite Senior Vitamin Cetirizine 10 mg; one tab with Breakfast Flovent Inhaler; one puff into lungs twice a day Tradjenta 5 mg tab; one half with Breakfast Furosemide 20 mg tab; with Breakfast Metformin 500 mg tab; two with Breakfast, two with Dinner Metoprolol Tar 25 mg tab; one half with Breakfast, one half at Bedtime Lisinopril 20 mg tab; one with Breakfast Jardiance 10 mg tab; one half tab with Breakfast Clopidogrel 75 mg tab; one with Breakfast Gabapentin 300 mg cap; two with Breakfast, two at Bedtime One Touch Test Strips   Patient declined the following medications last month: One Touch Lancets (has enough on hand) Sertraline 25 mg tab; one at Bedtime (has been d/c'd) Quetiapine 25 mg tab; at Bedtime (has been d/c'd)   Patient is due for next adherence delivery on: 01-31-2020. Called patient and reviewed medications and coordinated delivery.  This delivery to include: Famotidine 20 mg; one tab with Breakfast Certavite Senior Vitamin Cetirizine 10 mg; one tab with Breakfast Tradjenta 5 mg tab; one half with Breakfast Furosemide 20 mg tab; with Breakfast Metformin 500 mg tab; two with Breakfast, two with Dinner Metoprolol Tar  25 mg tab; one half with Breakfast, one half at Bedtime Lisinopril 20 mg tab; one with Breakfast Jardiance 10 mg tab; one half tab with Breakfast Clopidogrel 75 mg tab; one with Breakfast Gabapentin 300 mg cap; two with Breakfast, two at Bedtime   Patient declined the following medications  Flovent 220 MCG/ACT Inhaler; one puff into lungs twice a day (is not using as directed because she states it dries her mouth out) One Touch test Strips  Patient needs refills for Flovent Inhaler. Requested refill with the PCP.  Confirmed delivery date of 01-31-2020, advised patient that pharmacy will contact them the morning of delivery.  Follow-Up:  Coordination of Enhanced Pharmacy Services and Pharmacist Review   Fanny Skates, [place] Pharmacist Assistant [phone]  10 minutes spent in review, coordination, and documentation.   Reviewed by: De Blanch, PharmD, BCACP Clinical Pharmacist [facility] [phone]

## 2020-01-25 ENCOUNTER — Telehealth: Payer: Self-pay

## 2020-01-25 DIAGNOSIS — J45901 Unspecified asthma with (acute) exacerbation: Secondary | ICD-10-CM

## 2020-01-25 MED ORDER — FLUTICASONE PROPIONATE HFA 220 MCG/ACT IN AERO: 1.0000 | INHALATION_SPRAY | Freq: Two times a day (BID) | RESPIRATORY_TRACT | 2 refills | Status: DC

## 2020-01-25 NOTE — Telephone Encounter (Signed)
-----   Message from Lafonda Mosses sent at 01/24/2020  6:22 PM EST ----- Regarding: Medication Refill Request Hello there! I hope that you are well and staying safe. Would you please send a refill of Flovent Inhaler to [facility] for the patient mentioned above.  Thank you, Fanny Skates, [place] Pharmacist Assistant [phone]

## 2020-02-19 ENCOUNTER — Ambulatory Visit: Payer: Medicare Other | Admitting: Neurology

## 2020-02-25 ENCOUNTER — Telehealth: Payer: Self-pay | Admitting: Pharmacist

## 2020-02-25 NOTE — Progress Notes (Addendum)
Chronic Care Management Pharmacy Assistant   Name: SANDA DEJOY  MRN: [id number] DOB: 11-19-1950  Reason for Encounter: Medication Review   PCP : Darreld Mclean, MD  Allergies:   Allergies  Allergen Reactions   Influenza Vaccines Anaphylaxis   Influenza Virus Vaccine Anaphylaxis   Demerol Other (See Comments)    SEIZURE   Nsaids Other (See Comments)    NAPROXEN, IBUPROFEN, SKELAXIN---  CAUSES PALPITATIONS   Prednisone Other (See Comments)    Caused her glucose to go up to 500 and went to ED   Tramadol Other (See Comments)    Contraindicated with Victoza    Codeine Itching   Mango Flavor [Flavoring Agent] Itching    Medications: Outpatient Encounter Medications as of 02/25/2020  Medication Sig Note   albuterol (VENTOLIN HFA) 108 (90 Base) MCG/ACT inhaler Inhale 2 puffs into the lungs every 6 (six) hours as needed for wheezing or shortness of breath.    blood glucose meter kit and supplies KIT Dispense based on patient and insurance preference. Use up to four times daily as directed. (FOR ICD-9 250.00, 250.01). Pt uses once a day    Blood Glucose Monitoring Suppl (ONE TOUCH ULTRA 2) w/Device KIT Use to check glucose up to twice daily. E11.9 dx code    cetirizine (ZYRTEC) 10 MG tablet Take 1 tablet (10 mg total) by mouth daily.    clopidogrel (PLAVIX) 75 MG tablet Take 1 tablet (75 mg total) by mouth daily.    empagliflozin (JARDIANCE) 10 MG TABS tablet Take 1 tablet (10 mg total) by mouth daily. Take 0.5 tablets (5 mg total) daily.    famotidine (PEPCID) 20 MG tablet Take 20 mg by mouth daily.     fluticasone (FLOVENT HFA) 220 MCG/ACT inhaler Inhale 1 puff into the lungs 2 (two) times daily. May increase to 2 puffs if needed (Patient not taking: Reported on 01/30/2020) 01/30/2020: States this dries out her mouth   furosemide (LASIX) 20 MG tablet Take 1 tablet (20 mg total) by mouth daily.    gabapentin (NEURONTIN) 300 MG capsule TAKE 2 CAPSULES BY MOUTH TWO TIMES  DAILY    glucose blood (ACCU-CHEK AVIVA PLUS) test strip Use as instructed    glucose blood (ONETOUCH ULTRA) test strip Use to check glucose up to twice daily. E11.9 dx code    ipratropium (ATROVENT) 0.03 % nasal spray Place 2 sprays into the nose 4 (four) times daily.    Lancets Misc. (ACCU-CHEK SOFTCLIX LANCET DEV) KIT USE AS DIRECTED    linagliptin (TRADJENTA) 5 MG TABS tablet TAKE 1/2 TABLET BY MOUTH DAILY *NEED OFFICE VISIT*    lisinopril (ZESTRIL) 20 MG tablet Take 1 tablet (20 mg total) by mouth daily.    metFORMIN (GLUCOPHAGE) 500 MG tablet TAKE 2 TABLETS BY MOUTH TWO TIMES DAILY    methocarbamol (ROBAXIN) 500 MG tablet Take 1 tablet (500 mg total) by mouth 3 (three) times daily. 09/27/2019: States this is like "drinking water" just hurts her stomach   metoprolol tartrate (LOPRESSOR) 25 MG tablet Take 1 tablet (25 mg total) by mouth daily.    Multiple Vitamins-Minerals (MULTIVITAMIN PO) Take 2 tablets by mouth daily.    OneTouch Delica Lancets 35K MISC Use to check glucose up to twice daily. E11.9 dx code    oxyCODONE-acetaminophen (PERCOCET/ROXICET) 5-325 MG tablet Take 0.5 tablets by mouth every 4 (four) hours as needed for severe pain (pain).    No facility-administered encounter medications on file as of 02/25/2020.  Current Diagnosis: Patient Active Problem List   Diagnosis Date Noted   Traumatic hematoma of left lower leg 09/26/2018   S/P total knee arthroplasty, left 03/29/2017   Spondylosis without myelopathy or radiculopathy, cervical region 03/29/2017   Bilateral carpal tunnel syndrome 03/29/2017   Acute asthma exacerbation 08/19/2016   Cervicalgia 03/23/2016   Vertigo 04/14/2015   Anxiety state 01/22/2015   HLD (hyperlipidemia) 01/22/2015   Type 2 diabetes mellitus with circulatory disorder ([place]) 01/22/2015   Intracranial vascular stenosis 01/22/2015   Aneurysm ([place])    Headache    Essential hypertension 11/12/2014   Dependent edema 11/12/2014   Peripheral  neuropathy 11/12/2014   Depression 11/12/2014   Facial droop    Chest pain    TIA (transient ischemic attack) 11/11/2014   Dyspnea 08/06/2014   Hyperthyroidism 08/06/2014   Meniere's disease 06/07/2014   History of CHF (congestive heart failure) 06/07/2014   Goiter 06/13/2013   Obesity, unspecified 06/13/2013    Goals Addressed   None    Reviewed chart for medication changes ahead of medication coordination call.  No OVs, Consults, or hospital visits since last care coordination call  No medication changes indicated.  BP Readings from Last 3 Encounters:  12/10/19 126/72  09/06/19 (!) 107/52  09/04/19 (!) 116/61    Lab Results  Component Value Date   HGBA1C 6.2 04/16/2019     Patient obtains medications through Adherence Packaging  30 Days   Last adherence delivery included: Famotidine 20 mg; one tab with Breakfast Certavite Senior Vitamin Cetirizine 10 mg; one tab with Breakfast Tradjenta 5 mg tab; one half with Breakfast Furosemide 20 mg tab; with Breakfast Metformin 500 mg tab; two with Breakfast, two with Dinner Metoprolol Tar 25 mg tab; one half with Breakfast, one half at Bedtime Lisinopril 20 mg tab; one with Breakfast Jardiance 10 mg tab; one half tab with Breakfast Clopidogrel 75 mg tab; one with Breakfast Gabapentin 300 mg cap; two with Breakfast, two at Bedtime  Patient declined (meds) last month: One Touch Lancets (has enough on hand) Sertraline 25 mg tab; one at Bedtime (has been d/c'd) Quetiapine 25 mg tab; at Bedtime (has been d/c'd)  Patient is due for next adherence delivery on: 03/03/2020. Called patient and reviewed medications and coordinated delivery.  This delivery to include: Famotidine 20 mg; one tab with Breakfast Certavite Senior Vitamin Cetirizine 10 mg; one tab with Breakfast Tradjenta 5 mg tab; one half with Breakfast Furosemide 20 mg tab; with Breakfast Metformin 500 mg tab; two with Breakfast, two with Dinner Metoprolol Tar  25 mg tab; one half with Breakfast, one half at Bedtime Lisinopril 20 mg tab; one with Breakfast Jardiance 10 mg tab; one half tab with Breakfast Clopidogrel 75 mg tab; one with Breakfast Gabapentin 300 mg cap; two with Breakfast, two at Bedtime Flovent HFA Aer 220 mcg INHALE 1 PUFF BY MOUTH INTO LUNGS TWICE DAILY. MAY INCREASE TO TWO PUFFS  Patient declined the following medications: One Touch Lancets (has enough on hand) Sertraline 25 mg tab; one at Bedtime (has been d/c'd) Quetiapine 25 mg tab; at Bedtime (has been d/c'd) One Touch Test Strips (has enough on hand)   Patient does not need any refills at this time.   Confirmed delivery date of 03/03/2020, advised patient that pharmacy will contact them the morning of delivery.  Follow-Up:  Patient Assistance Coordination and Pharmacist Review   Charlann Lange, [place] Pharmacist Assistant [phone]  8 minutes spent in review, coordination, and documentation.  Reviewed by:  Beverly Milch, PharmD Clinical Pharmacist [place] [phone]

## 2020-02-26 ENCOUNTER — Telehealth: Payer: Self-pay

## 2020-02-26 ENCOUNTER — Telehealth: Payer: Self-pay | Admitting: Pharmacist

## 2020-02-26 DIAGNOSIS — Z98818 Other dental procedure status: Secondary | ICD-10-CM

## 2020-02-26 MED ORDER — AMOXICILLIN 500 MG PO CAPS: 2000.0000 mg | ORAL_CAPSULE | Freq: Once | ORAL | 2 refills | Status: DC

## 2020-02-26 NOTE — Addendum Note (Signed)
Addended by: COPLAND, JESSICA C on: 12/16/2020 12:53 PM   Modules accepted: Orders  

## 2020-02-26 NOTE — Telephone Encounter (Signed)
Called her to confirm- we generally use amox 2gm for dental procedure prophylaxis.  She is ok with this rx, sent to [facility] for her now   Meds ordered this encounter  Medications  . amoxicillin (AMOXIL) 500 MG capsule    Sig: Take 4 capsules (2,000 mg total) by mouth once for 1 dose. Use prior to dental work    Dispense:  4 capsule    Refill:  2

## 2020-02-26 NOTE — Telephone Encounter (Signed)
-----   Message from Rosetta Posner sent at 02/26/2020 11:11 AM EST ----- Regarding: New Medication Refill Good morning I recently spoke with the patient and she called to request a Rx be sent to [facility] for Pen VK 500 mg (one pill) so she could take 1 hour before her dental appointment.Thank you.

## 2020-02-26 NOTE — Progress Notes (Addendum)
    Chronic Care Management Pharmacy Assistant   Name: Sherry [name]  MRN: [id number] DOB: March 08, 1950  Reason for Encounter: Medication Review  PCP : Darreld Mclean, MD   Sent a message to Dr. Lillie Fragmin CMA as seen below:  Good morning I recently spoke with the patient and she called to request a [facility] for Pen VK 500 mg (one pill) so she could take 1 hour before her dental appointment.Thank you.   Follow-Up:  Pharmacist Review  2 minutes spent in review, coordination, and documentation.  Reviewed by: Beverly Milch, PharmD Clinical Pharmacist [facility] [phone]

## 2020-02-28 ENCOUNTER — Telehealth: Payer: Self-pay | Admitting: Pharmacist

## 2020-02-28 NOTE — Progress Notes (Signed)
Verified Adherence Gap Information. Per insurance data, the patient is 100% compliant with the following medications: Jardiance 10 mg, Metformin 500 mg, Lisinopril 20 mg tab. All medications listed were 30 day supply last reported date is 12-03-2019.  Fanny Skates, [place] Pharmacist Assistant [phone]

## 2020-03-26 ENCOUNTER — Telehealth: Payer: Self-pay | Admitting: Pharmacist

## 2020-03-26 NOTE — Progress Notes (Addendum)
Chronic Care Management Pharmacy Assistant   Name: Sherry [name]  MRN: [id number] DOB: [birth year]  Reason for Encounter: Medication Review  PCP : Darreld Mclean, MD  Allergies:   Allergies  Allergen Reactions   Influenza Vaccines Anaphylaxis   Influenza Virus Vaccine Anaphylaxis   Demerol Other (See Comments)    SEIZURE   Nsaids Other (See Comments)    NAPROXEN, IBUPROFEN, SKELAXIN---  CAUSES PALPITATIONS   Prednisone Other (See Comments)    Caused her glucose to go up to 500 and went to ED   Tramadol Other (See Comments)    Contraindicated with Victoza    Codeine Itching   Mango Flavor [Flavoring Agent] Itching    Medications: Outpatient Encounter Medications as of 03/26/2020  Medication Sig Note   albuterol (VENTOLIN HFA) 108 (90 Base) MCG/ACT inhaler Inhale 2 puffs into the lungs every 6 (six) hours as needed for wheezing or shortness of breath.    blood glucose meter kit and supplies KIT Dispense based on patient and insurance preference. Use up to four times daily as directed. (FOR ICD-9 250.00, 250.01). Pt uses once a day    Blood Glucose Monitoring Suppl (ONE TOUCH ULTRA 2) w/Device KIT Use to check glucose up to twice daily. E11.9 dx code    cetirizine (ZYRTEC) 10 MG tablet Take 1 tablet (10 mg total) by mouth daily.    clopidogrel (PLAVIX) 75 MG tablet Take 1 tablet (75 mg total) by mouth daily.    empagliflozin (JARDIANCE) 10 MG TABS tablet Take 1 tablet (10 mg total) by mouth daily. Take 0.5 tablets (5 mg total) daily.    famotidine (PEPCID) 20 MG tablet Take 20 mg by mouth daily.     fluticasone (FLOVENT HFA) 220 MCG/ACT inhaler Inhale 1 puff into the lungs 2 (two) times daily. May increase to 2 puffs if needed (Patient not taking: Reported on 01/30/2020) 01/30/2020: States this dries out her mouth   furosemide (LASIX) 20 MG tablet Take 1 tablet (20 mg total) by mouth daily.    gabapentin (NEURONTIN) 300 MG capsule TAKE 2 CAPSULES BY MOUTH TWO TIMES  DAILY    glucose blood (ACCU-CHEK AVIVA PLUS) test strip Use as instructed    glucose blood (ONETOUCH ULTRA) test strip Use to check glucose up to twice daily. E11.9 dx code    ipratropium (ATROVENT) 0.03 % nasal spray Place 2 sprays into the nose 4 (four) times daily.    Lancets Misc. (ACCU-CHEK SOFTCLIX LANCET DEV) KIT USE AS DIRECTED    linagliptin (TRADJENTA) 5 MG TABS tablet TAKE 1/2 TABLET BY MOUTH DAILY *NEED OFFICE VISIT*    lisinopril (ZESTRIL) 20 MG tablet Take 1 tablet (20 mg total) by mouth daily.    metFORMIN (GLUCOPHAGE) 500 MG tablet TAKE 2 TABLETS BY MOUTH TWO TIMES DAILY    methocarbamol (ROBAXIN) 500 MG tablet Take 1 tablet (500 mg total) by mouth 3 (three) times daily. 09/27/2019: States this is like "drinking water" just hurts her stomach   metoprolol tartrate (LOPRESSOR) 25 MG tablet Take 1 tablet (25 mg total) by mouth daily.    Multiple Vitamins-Minerals (MULTIVITAMIN PO) Take 2 tablets by mouth daily.    OneTouch Delica Lancets 33I MISC Use to check glucose up to twice daily. E11.9 dx code    oxyCODONE-acetaminophen (PERCOCET/ROXICET) 5-325 MG tablet Take 0.5 tablets by mouth every 4 (four) hours as needed for severe pain (pain).    No facility-administered encounter medications on file as of 03/26/2020.  Current Diagnosis: Patient Active Problem List   Diagnosis Date Noted   Traumatic hematoma of left lower leg 09/26/2018   S/P total knee arthroplasty, left 03/29/2017   Spondylosis without myelopathy or radiculopathy, cervical region 03/29/2017   Bilateral carpal tunnel syndrome 03/29/2017   Acute asthma exacerbation 08/19/2016   Cervicalgia 03/23/2016   Vertigo 04/14/2015   Anxiety state 01/22/2015   HLD (hyperlipidemia) 01/22/2015   Type 2 diabetes mellitus with circulatory disorder ([place]) 01/22/2015   Intracranial vascular stenosis 01/22/2015   Aneurysm ([place])    Headache    Essential hypertension 11/12/2014   Dependent edema 11/12/2014   Peripheral  neuropathy 11/12/2014   Depression 11/12/2014   Facial droop    Chest pain    TIA (transient ischemic attack) 11/11/2014   Dyspnea 08/06/2014   Hyperthyroidism 08/06/2014   Meniere's disease 06/07/2014   History of CHF (congestive heart failure) 06/07/2014   Goiter 06/13/2013   Obesity, unspecified 06/13/2013    Goals Addressed   None    Reviewed chart for medication changes ahead of medication coordination call.  No OVs, Consults, or hospital visits since last adherence review.  No medication changes indicated.  BP Readings from Last 3 Encounters:  12/10/19 126/72  09/06/19 (!) 107/52  09/04/19 (!) 116/61    Lab Results  Component Value Date   HGBA1C 6.2 04/16/2019     Patient obtains medications through Adherence Packaging  30 Days   Last adherence delivery included: Famotidine 20 mg; one tab with Breakfast Certavite Senior Vitamin one tab with breakfast Cetirizine 10 mg; one tab with Breakfast Tradjenta 5 mg tab; one half with Breakfast Furosemide 20 mg tab; with Breakfast Metformin 500 mg tab; two with Breakfast, two with Dinner Metoprolol Tar 25 mg tab; one half with Breakfast, one half at Bedtime Lisinopril 20 mg tab; one with Breakfast Jardiance 10 mg tab; one half tab with Breakfast Clopidogrel 75 mg tab; one with Breakfast Gabapentin 300 mg cap; two with Breakfast, two at Bedtime Flovent HFA Aer 220 mcg INHALE 1 PUFF BY MOUTH INTO LUNGS TWICE DAILY. MAY INCREASE TO TWO PUFFS  Patient declined meds last month: One Touch Lancets (has enough on hand) Sertraline 25 mg tab; one at Bedtime (has been d/c'd) Quetiapine 25 mg tab; at Bedtime (has been d/c'd) One Touch Test Strips (has enough on hand)  Patient is due for next adherence delivery on: 03/31/20.  Called patient and reviewed medications and coordinated delivery.  This delivery to include: Famotidine 20 mg; one tab with Breakfast Certavite Senior Vitamin one tab with breakfast Cetirizine 10 mg;  one tab with Breakfast Tradjenta 5 mg tab; one half with Breakfast Furosemide 20 mg tab; with Breakfast Metformin 500 mg tab; two with Breakfast, two with Dinner Metoprolol Tar 25 mg tab; one half with Breakfast, one half at Bedtime Lisinopril 20 mg tab; one with Breakfast Jardiance 10 mg tab; one half tab with Breakfast Clopidogrel 75 mg tab; one with Breakfast Gabapentin 300 mg cap; two with Breakfast, two at Bedtime One Touch Test Strips Flovent HFA Aer 220 mcg INHALE 1 PUFF BY MOUTH INTO LUNGS TWICE DAILY. MAY INCREASE TO TWO PUFFS  Patient declined the following medications: One Touch Lancets (has enough on hand)  Patient does not need any refills at this time.  Confirmed delivery date of 03/31/20, advised patient that pharmacy will contact them the morning of delivery.   Follow-Up:  Coordination of Enhanced Pharmacy Services and Pharmacist Review  10 minutes spent in review, coordination, and  documentation.  Reviewed by: Beverly Milch, PharmD Clinical Pharmacist [facility] [phone]

## 2020-04-10 ENCOUNTER — Other Ambulatory Visit: Payer: Self-pay | Admitting: Family Medicine

## 2020-04-10 DIAGNOSIS — Z1231 Encounter for screening mammogram for malignant neoplasm of breast: Secondary | ICD-10-CM

## 2020-04-17 ENCOUNTER — Ambulatory Visit: Payer: Medicare Other | Admitting: Family Medicine

## 2020-04-17 ENCOUNTER — Ambulatory Visit: Payer: Medicare Other

## 2020-04-22 ENCOUNTER — Telehealth: Payer: Self-pay | Admitting: *Deleted

## 2020-04-22 NOTE — Telephone Encounter (Signed)
   [facility] Pre-operative Risk Assessment    HEARTCARE STAFF: - Please ensure there is not already an duplicate clearance open for this procedure. - Under Visit Info/Reason for Call, type in Other and utilize the format Clearance MM/DD/YY or Clearance TBD. Do not use dashes or single digits. - If request is for dental extraction, please clarify the # of teeth to be extracted.  Request for surgical clearance:  1. What type of surgery is being performed? Right reverse total shoulder replacement   2. When is this surgery scheduled? TBD   3. What type of clearance is required (medical clearance vs. Pharmacy clearance to hold med vs. Both)? both  4. Are there any medications that need to be held prior to surgery and how long?plavix   5. Practice name and name of physician performing surgery? [facility]   6. What is the office phone number? [phone]   7.   What is the office fax number? [phone]  8.   Anesthesia type (None, local, MAC, general) ? Not listed   Sherry [name] 04/22/2020, 4:51 PM  _________________________________________________________________   (provider comments below)

## 2020-04-23 ENCOUNTER — Telehealth: Payer: Self-pay

## 2020-04-23 NOTE — Telephone Encounter (Signed)
   Primary Cardiologist: Sherry Casino, MD  Chart reviewed as part of pre-operative protocol coverage. Because of [name]'s past medical history and time since last visit, she will require a follow-up visit in order to better assess preoperative cardiovascular risk.  Pre-op covering staff: - Please schedule appointment and call patient to inform them. If patient already had an upcoming appointment within acceptable timeframe, please add "pre-op clearance" to the appointment notes so provider is aware. - Please contact requesting surgeon's office via preferred method (i.e, phone, fax) to inform them of need for appointment prior to surgery.   Per previous recommendation and given lack of interval change in cardiac history, would be reasonable to hold plavix 5 days prior to her upcoming shoulder surgery.  Sherry Butts, PA-C  04/23/2020, 1:10 PM

## 2020-04-23 NOTE — Telephone Encounter (Signed)
Left voicemail for patient to call office.

## 2020-04-23 NOTE — Telephone Encounter (Signed)
Left voicemail for patient to call Office. Sent message to NL Scheduling to schedule appointment for Pre-op Clearance

## 2020-04-25 ENCOUNTER — Telehealth: Payer: Self-pay | Admitting: Pharmacist

## 2020-04-25 NOTE — Progress Notes (Addendum)
Chronic Care Management Pharmacy Assistant   Name: Sherry [name]  MRN: [id number] DOB: 1950/10/28  Reason for Encounter: Medication Review   Medications: Outpatient Encounter Medications as of 04/25/2020  Medication Sig Note   albuterol (VENTOLIN HFA) 108 (90 Base) MCG/ACT inhaler Inhale 2 puffs into the lungs every 6 (six) hours as needed for wheezing or shortness of breath.    blood glucose meter kit and supplies KIT Dispense based on patient and insurance preference. Use up to four times daily as directed. (FOR ICD-9 250.00, 250.01). Pt uses once a day    Blood Glucose Monitoring Suppl (ONE TOUCH ULTRA 2) w/Device KIT Use to check glucose up to twice daily. E11.9 dx code    cetirizine (ZYRTEC) 10 MG tablet Take 1 tablet (10 mg total) by mouth daily.    clopidogrel (PLAVIX) 75 MG tablet Take 1 tablet (75 mg total) by mouth daily.    empagliflozin (JARDIANCE) 10 MG TABS tablet Take 1 tablet (10 mg total) by mouth daily. Take 0.5 tablets (5 mg total) daily.    famotidine (PEPCID) 20 MG tablet Take 20 mg by mouth daily.     fluticasone (FLOVENT HFA) 220 MCG/ACT inhaler Inhale 1 puff into the lungs 2 (two) times daily. May increase to 2 puffs if needed (Patient not taking: Reported on 01/30/2020) 01/30/2020: States this dries out her mouth   furosemide (LASIX) 20 MG tablet Take 1 tablet (20 mg total) by mouth daily.    gabapentin (NEURONTIN) 300 MG capsule TAKE 2 CAPSULES BY MOUTH TWO TIMES DAILY    glucose blood (ACCU-CHEK AVIVA PLUS) test strip Use as instructed    glucose blood (ONETOUCH ULTRA) test strip Use to check glucose up to twice daily. E11.9 dx code    ipratropium (ATROVENT) 0.03 % nasal spray Place 2 sprays into the nose 4 (four) times daily.    Lancets Misc. (ACCU-CHEK SOFTCLIX LANCET DEV) KIT USE AS DIRECTED    linagliptin (TRADJENTA) 5 MG TABS tablet TAKE 1/2 TABLET BY MOUTH DAILY *NEED OFFICE VISIT*    lisinopril (ZESTRIL) 20 MG tablet Take 1 tablet (20 mg total) by  mouth daily.    metFORMIN (GLUCOPHAGE) 500 MG tablet TAKE 2 TABLETS BY MOUTH TWO TIMES DAILY    methocarbamol (ROBAXIN) 500 MG tablet Take 1 tablet (500 mg total) by mouth 3 (three) times daily. 09/27/2019: States this is like "drinking water" just hurts her stomach   metoprolol tartrate (LOPRESSOR) 25 MG tablet Take 1 tablet (25 mg total) by mouth daily.    Multiple Vitamins-Minerals (MULTIVITAMIN PO) Take 2 tablets by mouth daily.    OneTouch Delica Lancets 85I MISC Use to check glucose up to twice daily. E11.9 dx code    oxyCODONE-acetaminophen (PERCOCET/ROXICET) 5-325 MG tablet Take 0.5 tablets by mouth every 4 (four) hours as needed for severe pain (pain).    No facility-administered encounter medications on file as of 04/25/2020.   Reviewed chart for medication changes ahead of medication coordination call.  No OVs, Consults, or hospital visits since last care coordination call.  No medication changes indicated.  BP Readings from Last 3 Encounters:  12/10/19 126/72  09/06/19 (!) 107/52  09/04/19 (!) 116/61    Lab Results  Component Value Date   HGBA1C 6.2 04/16/2019     Patient obtains medications through Adherence Packaging  30 Days   Last adherence delivery included: Famotidine 20 mg; one tab with Breakfast Certavite Senior Vitamin one tab with breakfast Cetirizine 10 mg; one tab  with Breakfast Tradjenta 5 mg tab; one half with Breakfast Furosemide 20 mg tab; with Breakfast Metformin 500 mg tab; two with Breakfast, two with Dinner Metoprolol Tar 25 mg tab; one half with Breakfast, one half at Bedtime Lisinopril 20 mg tab; one with Breakfast Jardiance 10 mg tab; one half tab with Breakfast Clopidogrel 75 mg tab; one with Breakfast Gabapentin 300 mg cap; two with Breakfast, two at Bedtime One Touch Test Strips Flovent HFA Aer 220 mcg INHALE 1 PUFF BY MOUTH INTO LUNGS TWICE DAILY. MAY INCREASE TO TWO PUFFS  Patient declined meds last month: One Touch Lancets (has enough  on hand)  Patient is due for next adherence delivery on: 05/02/20.  Called patient and reviewed medications and coordinated delivery.  This delivery to include: Famotidine 20 mg; one tab with Breakfast Certavite Senior Vitamin one tab with breakfast Cetirizine 10 mg; one tab with Breakfast Tradjenta 5 mg tab; one half with Breakfast Furosemide 20 mg tab; with Breakfast Metformin 500 mg tab; two with Breakfast, two with Dinner Metoprolol Tar 25 mg tab; one half with Breakfast, one half at Bedtime Lisinopril 20 mg tab; one with Breakfast Jardiance 10 mg tab; one half tab with Breakfast Clopidogrel 75 mg tab; one with Breakfast Gabapentin 300 mg cap; two with Breakfast, two at Bedtime Atrovent 0.03% spray 2 sprays into nose 4 times daily Albuterol ( ventolin HFA) 108 (90 Base) MCG?ACT inhaler; Inhale 2 puffs into the lungs every 6 (six) hours as needed for wheezing or shortness of breath. Amoxicillin 500 mg (Vial for dental work)  Patient declined the following medications: (has enough on hand) Flovent HFA Aer 220 mcg INHALE 1 PUFF BY MOUTH INTO LUNGS TWICE DAILY. MAY INCREASE TO TWO PUFFS  One Touch Lancets (has enough on hand) One Touch Test Strips   Patient needs refills for: Albuterol ( ventolin HFA) 108 (90 Base) MCG/ACT inhaler.  Confirmed delivery date of 05/02/20, advised patient that pharmacy will contact them the [name] of delivery.   Follow-Up: Pharmacist Review  Charlann Lange, RMA Clinical Pharmacist Assistant [phone]  9 minutes spent in review, coordination, and documentation.  Reviewed by: Beverly Milch, PharmD Clinical Pharmacist [facility] [phone]

## 2020-04-28 ENCOUNTER — Encounter: Payer: Self-pay | Admitting: Family Medicine

## 2020-04-28 ENCOUNTER — Telehealth: Payer: Self-pay

## 2020-04-28 MED ORDER — ALBUTEROL SULFATE HFA 108 (90 BASE) MCG/ACT IN AERS: 2.0000 | INHALATION_SPRAY | Freq: Four times a day (QID) | RESPIRATORY_TRACT | 2 refills | Status: AC | PRN

## 2020-04-28 NOTE — Telephone Encounter (Signed)
-----   Message from [place] sent at 04/25/2020  9:30 AM EDT ----- Regarding: Medication refill Good morning, spoke with the patient this morning and she informed me that she needs a refill for her Albuterol ( ventolin HFA) 108 (90 Base) MCG?ACT inhaler; Inhale 2 puffs into the lungs every 6 (six) hours as needed for wheezing or shortness of breath [facility]. Thank you.

## 2020-04-30 ENCOUNTER — Telehealth: Payer: Self-pay

## 2020-04-30 DIAGNOSIS — R0982 Postnasal drip: Secondary | ICD-10-CM

## 2020-04-30 MED ORDER — IPRATROPIUM BROMIDE 0.03 % NA SOLN: 2.0000 | Freq: Four times a day (QID) | NASAL | 1 refills | Status: DC

## 2020-04-30 NOTE — Telephone Encounter (Signed)
-----   Message from [place] sent at 04/30/2020 11:09 AM EDT ----- Regarding: Medication Refill Good morning can you please send a refill to [facility] for the patients Atrovent 0.03% spray, She stated she needed the nasal spray for the spring/pollen. Thank you.

## 2020-05-06 NOTE — Progress Notes (Signed)
Cardiology Office Note:    Date:  05/19/2020   ID:  [name], DOB Dec 02, 1950, MRN [id number]  PCP:  Darreld Mclean, MD  Cardiologist:  Pixie Casino, MD  Electrophysiologist:  None   Referring MD: Darreld Mclean, MD   Chief Complaint: pre-op evaluation for shoulder surgery  History of Present Illness:    OYINDAMOLA KEY is a 70 y.o. female with a history of chest pain with multiple low risk nuclear stress tests (most recently in 05/2019), self reported history of CHF but normal EF on Echo, questionable mitral valve prolapse, recurrent TIA, cerebral aneurysm (not coiled), hypertension, hyperlipidemia, type 2 diabetes mellitus, GERD with PUD, hiatal hernia, and meniere's disease who is followed by Dr. Debara Pickett and presents today for pre-op evaluation.   Last Echo in 04/2015 showed LVEF of 60-65% with normal wall motion and no significant valvular disease. Patient was last seen by Almyra Deforest, PA-C, in 05/2019 for fatigue and chest tightness not clearly correlated with exertion. TSH was normal. Myoview was ordered and was low risk with no evidence of ischemia. EF 60%.   Patient presents today for pre-op evaluation for right shoulder surgery. She has done well since last visit. She continues to report feeling very tired/fatigue but states this is not any worse compared to last year. No chest pain or shortness of breath. She is still able to do chores around the house. She notes palpitations if she walks more than 1/2 block but denies any chest pain, significant shortness of breath, lightheadedness/dizziness with this. No near syncope/syncope. She also notes some fluttering in her chest a night that sounds like premature beats. She sleep on a couple of pillows at night due to reflux but no orthopnea or PND. No lower extremity edema.  Past Medical History:  Diagnosis Date  . Asthma   . Cerebral aneurysm   . Complication of anesthesia HYPOTENSION INTRAOP W/ HYSTERECTOMY IN 1987--   DID OK W/ TOTAL KNEE IN 2008  . Diabetes mellitus   . DJD (degenerative joint disease)   . Dyslipidemia   . GERD (gastroesophageal reflux disease)   . H/O hiatal hernia   . History of CHF (congestive heart failure) 2010-  RESOLVED  . History of peptic ulcer YRS AGO  . Hypertension   . Meniere's disease   . Mitral valve prolapse occasional palpitations w/ chest discomfort  . OA (osteoarthritis)   . Rotator cuff tear, left   . Stroke [facility])     Past Surgical History:  Procedure Laterality Date  . BUNIONECTOMY  2007   LEFT FOOT  . IR ANGIO INTRA EXTRACRAN SEL COM CAROTID INNOMINATE BILAT MOD SED  03/22/2019  . IR ANGIO VERTEBRAL SEL SUBCLAVIAN INNOMINATE UNI L MOD SED  03/22/2019  . IR ANGIO VERTEBRAL SEL VERTEBRAL UNI R MOD SED  03/22/2019  . IR US GUIDE VASC ACCESS RIGHT  03/22/2019  . LEFT SHOULDER SURGERY  1997   ROTATOR CUFF REPAIR  . SHOULDER ARTHROSCOPY  09/20/2011   Procedure: ARTHROSCOPY SHOULDER;  Surgeon: Johnn Hai, MD;  Location: [facility];  Service: Orthopedics;  Laterality: Left;  SAD AND MINI OPEN ROTATOR CUFF REPAIR    . TOTAL KNEE ARTHROPLASTY  09-12-2006  [facility]   RIGHT KNEE  . TOTAL KNEE ARTHROPLASTY Left 11/30/2017   Procedure: LEFT TOTAL KNEE REPLACEMENT;  Surgeon: [name], MD;  Location: [place];  Service: Orthopedics;  Laterality: Left;  . TUBAL LIGATION  1976  . TYMPANOPLASTY  1990;   1980  . VAGINAL HYSTERECTOMY  1987    Current Medications: Current Meds  Medication Sig  . albuterol (VENTOLIN HFA) 108 (90 Base) MCG/ACT inhaler Inhale 2 puffs into the lungs every 6 (six) hours as needed for wheezing or shortness of breath.  Marland Kitchen amoxicillin (AMOXIL) 500 MG capsule TAKE FOUR CAPSULES BY MOUTH ONCE FOR ONE DOSE prior TO dental work  . blood glucose meter kit and supplies KIT Dispense based on patient and insurance preference. Use up to four times daily as directed. (FOR ICD-9 250.00, 250.01). Pt uses once a day  . Blood Glucose  Monitoring Suppl (ONE TOUCH ULTRA 2) w/Device KIT Use to check glucose up to twice daily. E11.9 dx code  . cetirizine (ZYRTEC) 10 MG tablet Take 1 tablet (10 mg total) by mouth daily.  . clopidogrel (PLAVIX) 75 MG tablet Take 1 tablet (75 mg total) by mouth daily.  . empagliflozin (JARDIANCE) 10 MG TABS tablet Take 1 tablet (10 mg total) by mouth daily. Take 0.5 tablets (5 mg total) daily.  . famotidine (PEPCID) 20 MG tablet Take 20 mg by mouth daily.   . fluticasone (FLOVENT HFA) 220 MCG/ACT inhaler Inhale 1 puff into the lungs 2 (two) times daily. May increase to 2 puffs if needed  . furosemide (LASIX) 20 MG tablet Take 1 tablet (20 mg total) by mouth daily.  Marland Kitchen gabapentin (NEURONTIN) 300 MG capsule TAKE 2 CAPSULES BY MOUTH TWO TIMES DAILY  . glucose blood (ACCU-CHEK AVIVA PLUS) test strip Use as instructed  . glucose blood (ONETOUCH ULTRA) test strip Use to check glucose up to twice daily. E11.9 dx code  . ipratropium (ATROVENT) 0.03 % nasal spray Place 2 sprays into the nose 4 (four) times daily.  . Lancets Misc. (ACCU-CHEK SOFTCLIX LANCET DEV) KIT USE AS DIRECTED  . linagliptin (TRADJENTA) 5 MG TABS tablet TAKE 1/2 TABLET BY MOUTH DAILY *NEED OFFICE VISIT*  . lisinopril (ZESTRIL) 20 MG tablet Take 1 tablet (20 mg total) by mouth daily.  . metFORMIN (GLUCOPHAGE) 500 MG tablet TAKE 2 TABLETS BY MOUTH TWO TIMES DAILY  . metoprolol tartrate (LOPRESSOR) 25 MG tablet Take 1 tablet (25 mg total) by mouth daily.  . Multiple Vitamins-Minerals (MULTIVITAMIN PO) Take 2 tablets by mouth daily.  [name] Delica Lancets 20U MISC Use to check glucose up to twice daily. E11.9 dx code  . oxyCODONE-acetaminophen (PERCOCET/ROXICET) 5-325 MG tablet Take 0.5 tablets by mouth every 4 (four) hours as needed for severe pain (pain).     Allergies:   Influenza vaccines, Influenza virus vaccine, Demerol, Nsaids, Prednisone, Tramadol, Codeine, and Mango flavor [flavoring agent]   Social History   Socioeconomic  History  . Marital status: Widowed    Spouse name: Not on file  . Number of children: 4  . Years of education: BA  . Highest education level: Not on file  Occupational History  . Occupation: LPN - retired  Tobacco Use  . Smoking status: Never Smoker  . Smokeless tobacco: Never Used  Vaping Use  . Vaping Use: Never used  Substance and Sexual Activity  . Alcohol use: No  . Drug use: No  . Sexual activity: Not on file  Other Topics Concern  . Not on file  Social History Narrative  . Not on file   Social Determinants of Health   Financial Resource Strain: Low Risk   . Difficulty of Paying Living Expenses: Not hard at all  Food Insecurity: Not on file  Transportation Needs:  Not on file  Physical Activity: Not on file  Stress: Not on file  Social Connections: Not on file     Family History: The patient's family history includes Aneurysm in her mother; Bipolar disorder in her son; Breast cancer in her maternal aunt, maternal aunt, maternal aunt, maternal grandmother, and mother; Congestive Heart Failure in her father; Diabetes in her father and mother; Heart attack in her sister; Heart disease in her mother and sister; Hypertension in her father and sister; Kidney disease in her mother; Lymphoma in her father; Other in her brother and son; Ovarian cancer in her mother; Stroke in her sister; Thyroid disease in her brother, mother, paternal grandmother, and sister.  ROS:   Please see the history of present illness.     EKGs/Labs/Other Studies Reviewed:    The following studies were reviewed today:  Echocardiogram 04/15/2015: Study Conclusions: - Left ventricle: The cavity size was normal. Wall thickness was  normal. Systolic function was normal. The estimated ejection  fraction was in the range of 60% to 65%. Wall motion was normal;  there were no regional wall motion abnormalities.  _______________  Myoview 05/30/2019:  Nuclear stress EF: 60%.  The left ventricular  ejection fraction is normal (55-65%).  The study is normal.  This is a low risk study.  There was no ST segment deviation noted during stress.   Low risk stress nuclear study with normal perfusion and normal left ventricular regional and global systolic function.  EKG:  EKG ordered today. EKG personally reviewed and demonstrates sinus bradycardia, rate 58 bpm, with underlying baseline artifact but no acute ischemic changes. Right axis deviation (seen on prior tracings). Normal PR and QRS intervals. QTc 447 ms.  Recent Labs: 09/04/2019: ALT 14; BUN 10; Creatinine, Ser 0.81; Hemoglobin 13.4; Platelets 196; Potassium 3.5; Sodium 137  Recent Lipid Panel    Component Value Date/Time   CHOL 138 04/16/2019 1556   TRIG 91.0 04/16/2019 1556   HDL 63.80 04/16/2019 1556   CHOLHDL 2 04/16/2019 1556   VLDL 18.2 04/16/2019 1556   LDLCALC 56 04/16/2019 1556    Physical Exam:    Vital Signs: BP 120/72   Pulse (!) 58   Ht 5' 1"  (1.549 m)   Wt 186 lb (84.4 kg)   SpO2 99%   BMI 35.14 kg/m     Wt Readings from Last 3 Encounters:  05/19/20 186 lb (84.4 kg)  12/10/19 182 lb (82.6 kg)  09/04/19 180 lb (81.6 kg)     General: 70 y.o. female in no acute distress. HEENT: Normocephalic and atraumatic. Sclera clear.  Neck: Supple. No carotid bruits. No JVD. Heart: Mildly bradycardic with normal rhythm. Distinct S1 and S2. No murmurs, gallops, or rubs. Radial pulses 2+ and equal bilaterally. Lungs: No increased work of breathing. Clear to ausculation bilaterally. No wheezes, rhonchi, or rales.  Abdomen: Soft, non-distended, and non-tender to palpation.  Extremities: No lower extremity edema.    Skin: Warm and dry. Neuro: Alert and oriented x3. No focal deficits. Psych: Normal affect. Responds appropriately.   Assessment:    1. Pre-op evaluation   2. Palpitations   3. Primary hypertension   4. Hyperlipidemia, unspecified hyperlipidemia type   5. Type 2 diabetes mellitus with complication,  without long-term current use of insulin ([place])   6. History of recurrent TIAs     Plan:    Pre-Op Evaluation - Patient has upcoming shoulder surgery planned. - Patient stable from a cardiac standpoint. No angina, acute CHF symptoms,  or syncope. Myoview last year in 05/2019 was low risk with no evidence of ischemia. - Per Revised Risk Index, considered low risk with 0.9% chance of adverse cardiac complications. Able to complete >4.0 METS. Therefore, based on ACC/AHA guidelines, patient would be at acceptable risk for the planned procedure without further cardiovascular testing. OK to hold Plavix for 5 days from a cardiac standpoint; however, patient on Plavix for recurrent TIAs so would ultimately defer this to PCP/Neurology. I will route this recommendation to the requesting party via Epic fax function.  Palpitations - Patient reports some heart racing when she walks greater than 1/2 block which I suspect is due to deconditioning as well as a "fluttering" sensation at night that sounds like premature beats. No sustained palpitations outside of exercise.  - No further work-up needed at this time. However, advised patient to let us know if she has sustained palpitations outside of exercise - may need monitor if that happens.  Hypertension - Well controlled. - Continue Lisinopril 52m daily and Lopressor 256mtwice daily.   Hyperlipidemia - Recent lipid panel on 04/16/2019: Total Cholesterol 138, Triglycerides 91, HDL 63, LDL 56. - Does not appear to be on a statin. - Followed by PCP.   Type 2 Diabetes Mellitus  - Hemoglobin A1c 6.2 on 04/16/2019. - On Metformin, Linagliptin, and Jardiance at home. - Managed by PCP.  Recurrent TIAs  Cerebral Aneurysm - On Plavix.  - Followed by Neurology.  Disposition: Follow up in 1 year.   Medication Adjustments/Labs and Tests Ordered: Current medicines are reviewed at length with the patient today.  Concerns regarding medicines are outlined above.   No orders of the defined types were placed in this encounter.  No orders of the defined types were placed in this encounter.   Patient Instructions  Medication Instructions:  Continue current medications  *If you need a refill on your cardiac medications before your next appointment, please call your pharmacy*   Lab Work: None Ordered   Testing/Procedures: None Ordered   Follow-Up: At [facility] your [facility] needs are our priority.  As part of our continuing mission to provide you with exceptional heart care, we have created designated Provider Care Teams.  These Care Teams include your primary Cardiologist (physician) and Advanced Practice Providers (APPs -  Physician Assistants and Nurse Practitioners) who all work together to provide you with the care you need, when you need it.  We recommend signing up for the patient portal called "MyChart".  Sign up information is provided on this After Visit Summary.  MyChart is used to connect with patients for Virtual Visits (Telemedicine).  Patients are able to view lab/test results, encounter notes, upcoming appointments, etc.  Non-urgent messages can be sent to your provider as well.   To learn more about what you can do with MyChart, go to [email or web address]   Your next appointment:   1 year(s)  The format for your next appointment:   In Person  Provider:   You may see KePixie CasinoMD or one of the following Advanced Practice Providers on your designated Care Team:    HaAlmyra DeforestPA-C  AnFabian SharpPA-C or   KrRoby LoftsPA-C         Signed, CaDarreld McleanPAVermont4/12/2020 9:54 AM    [place]

## 2020-05-19 ENCOUNTER — Encounter: Payer: Self-pay | Admitting: Student

## 2020-05-19 ENCOUNTER — Other Ambulatory Visit: Payer: Self-pay

## 2020-05-19 ENCOUNTER — Ambulatory Visit: Payer: Medicare Other | Admitting: Student

## 2020-05-19 VITALS — BP 120/72 | HR 58 | Ht 61.0 in | Wt 186.0 lb

## 2020-05-19 DIAGNOSIS — E785 Hyperlipidemia, unspecified: Secondary | ICD-10-CM

## 2020-05-19 DIAGNOSIS — Z01818 Encounter for other preprocedural examination: Secondary | ICD-10-CM | POA: Diagnosis not present

## 2020-05-19 DIAGNOSIS — Z8673 Personal history of transient ischemic attack (TIA), and cerebral infarction without residual deficits: Secondary | ICD-10-CM | POA: Diagnosis not present

## 2020-05-19 DIAGNOSIS — I10 Essential (primary) hypertension: Secondary | ICD-10-CM

## 2020-05-19 DIAGNOSIS — E118 Type 2 diabetes mellitus with unspecified complications: Secondary | ICD-10-CM | POA: Diagnosis not present

## 2020-05-19 DIAGNOSIS — R002 Palpitations: Secondary | ICD-10-CM

## 2020-05-19 NOTE — Patient Instructions (Signed)
Medication Instructions:  °Continue current medications ° °*If you need a refill on your cardiac medications before your next appointment, please call your pharmacy* ° ° °Lab Work: °None Ordered ° ° °Testing/Procedures: °None Ordered ° ° °Follow-Up: °At [facility], you and your health needs are our priority.  As part of our continuing mission to provide you with exceptional heart care, we have created designated Provider Care Teams.  These Care Teams include your primary Cardiologist (physician) and Advanced Practice Providers (APPs -  Physician Assistants and Nurse Practitioners) who all work together to provide you with the care you need, when you need it. ° °We recommend signing up for the patient portal called "MyChart".  Sign up information is provided on this After Visit Summary.  MyChart is used to connect with patients for Virtual Visits (Telemedicine).  Patients are able to view lab/test results, encounter notes, upcoming appointments, etc.  Non-urgent messages can be sent to your provider as well.   °To learn more about what you can do with MyChart, go to [email or web address].   ° °Your next appointment:   °1 year(s) ° °The format for your next appointment:   °In Person ° °Provider:   °You may see Kenneth C Hilty, MD or one of the following Advanced Practice Providers on your designated Care Team:   °· Hao Meng, PA-C °· Angela Duke, PA-C or  °· Krista Kroeger, PA-C ° ° ° °

## 2020-05-23 ENCOUNTER — Telehealth: Payer: Self-pay | Admitting: Pharmacist

## 2020-05-23 NOTE — Progress Notes (Addendum)
  Chronic Care Management Pharmacy Assistant   Name: Sherry [name]  MRN: [id number] DOB: 03/18/1950  Reason for Encounter: Medication/Medication Coordination Call.  Medications: Outpatient Encounter Medications as of 05/23/2020  Medication Sig   albuterol (VENTOLIN HFA) 108 (90 Base) MCG/ACT inhaler Inhale 2 puffs into the lungs every 6 (six) hours as needed for wheezing or shortness of breath.   amoxicillin (AMOXIL) 500 MG capsule TAKE FOUR CAPSULES BY MOUTH ONCE FOR ONE DOSE prior TO dental work   blood glucose meter kit and supplies KIT Dispense based on patient and insurance preference. Use up to four times daily as directed. (FOR ICD-9 250.00, 250.01). Pt uses once a day   Blood Glucose Monitoring Suppl (ONE TOUCH ULTRA 2) w/Device KIT Use to check glucose up to twice daily. E11.9 dx code   cetirizine (ZYRTEC) 10 MG tablet Take 1 tablet (10 mg total) by mouth daily.   clopidogrel (PLAVIX) 75 MG tablet Take 1 tablet (75 mg total) by mouth daily.   empagliflozin (JARDIANCE) 10 MG TABS tablet Take 1 tablet (10 mg total) by mouth daily. Take 0.5 tablets (5 mg total) daily.   famotidine (PEPCID) 20 MG tablet Take 20 mg by mouth daily.    fluticasone (FLOVENT HFA) 220 MCG/ACT inhaler Inhale 1 puff into the lungs 2 (two) times daily. May increase to 2 puffs if needed   furosemide (LASIX) 20 MG tablet Take 1 tablet (20 mg total) by mouth daily.   gabapentin (NEURONTIN) 300 MG capsule TAKE 2 CAPSULES BY MOUTH TWO TIMES DAILY   glucose blood (ACCU-CHEK AVIVA PLUS) test strip Use as instructed   glucose blood (ONETOUCH ULTRA) test strip Use to check glucose up to twice daily. E11.9 dx code   ipratropium (ATROVENT) 0.03 % nasal spray Place 2 sprays into the nose 4 (four) times daily.   Lancets Misc. (ACCU-CHEK SOFTCLIX LANCET DEV) KIT USE AS DIRECTED   linagliptin (TRADJENTA) 5 MG TABS tablet TAKE 1/2 TABLET BY MOUTH DAILY *NEED OFFICE VISIT*   lisinopril (ZESTRIL) 20 MG tablet Take 1  tablet (20 mg total) by mouth daily.   metFORMIN (GLUCOPHAGE) 500 MG tablet TAKE 2 TABLETS BY MOUTH TWO TIMES DAILY   metoprolol tartrate (LOPRESSOR) 25 MG tablet Take 1 tablet (25 mg total) by mouth daily.   Multiple Vitamins-Minerals (MULTIVITAMIN PO) Take 2 tablets by mouth daily.   OneTouch Delica Lancets 33G MISC Use to check glucose up to twice daily. E11.9 dx code   oxyCODONE-acetaminophen (PERCOCET/ROXICET) 5-325 MG tablet Take 0.5 tablets by mouth every 4 (four) hours as needed for severe pain (pain).   No facility-administered encounter medications on file as of 05/23/2020.    Reviewed chart for medication changes ahead of medication coordination call.  No OVs or  hospital visits since last care coordination call.  Consults Visits: 05/19/20 Cardiology [facility] Callie E, PA-C. For pre-op evaluation. Per note: Hold Plavix for 5 days STOPPED Methocarbamol 500 mg 3 times daily.  No medication changes indicated.  BP Readings from Last 3 Encounters:  05/19/20 120/72  12/10/19 126/72  09/06/19 (!) 107/52    Lab Results  Component Value Date   HGBA1C 6.2 04/16/2019     Patient obtains medications through Adherence Packaging  30 Days   Last adherence delivery included: (05/02/20) Famotidine 20 mg; one tab with Breakfast Certavite Senior Vitamin one tab with breakfast Cetirizine 10 mg; one tab with Breakfast Tradjenta 5 mg tab; one half with Breakfast Furosemide 20 mg tab; with Breakfast Metformin 500   mg tab; two with Breakfast, two with Dinner Metoprolol Tar 25 mg tab; one half with Breakfast, one half at Bedtime Lisinopril 20 mg tab; one with Breakfast Jardiance 10 mg tab; one half tab with Breakfast Clopidogrel 75 mg tab; one with Breakfast Gabapentin 300 mg cap; two with Breakfast, two at Bedtime Atrovent 0.03% spray 2 sprays into nose 4 times daily Albuterol ( ventolin HFA) 108 (90 Base) MCG?ACT inhaler; Inhale 2 puffs into the lungs every 6 (six) hours as needed for  wheezing or shortness of breath. Amoxicillin 500 mg (Vial for dental work)  Patient declined meds last month: Flovent HFA Aer 220 mcg INHALE 1 PUFF BY MOUTH INTO LUNGS TWICE DAILY. MAY INCREASE TO TWO PUFFS  One Touch Lancets (has enough on hand) One Touch Test Strips   Patient is due for next adherence delivery on: 05/30/20.  Called patient and reviewed medications and coordinated delivery.  This delivery to include: Famotidine 20 mg; one tab with Breakfast Certavite Senior Vitamin one tab with breakfast Cetirizine 10 mg; one tab with Breakfast Tradjenta 5 mg tab; one half with Breakfast Furosemide 20 mg tab; with Breakfast Metformin 500 mg tab; two with Breakfast, two with Dinner Metoprolol Tar 25 mg tab; one half with Breakfast, one half at Bedtime Lisinopril 20 mg tab; one with Breakfast Jardiance 10 mg tab; one half tab with Breakfast Clopidogrel 75 mg tab; one with Breakfast Gabapentin 300 mg cap; two with Breakfast, two at Bedtime  Patient declined the following medications: (enough on hand) Atrovent 0.03% spray 2 sprays into nose 4 times daily Albuterol ( ventolin HFA) 108 (90 Base) MCG?ACT inhaler; Inhale 2 puffs into the lungs every 6 (six) hours as needed for wheezing or shortness of breath. Amoxicillin 500 mg (Vial for dental work) Flovent HFA Aer 220 mcg INHALE 1 PUFF BY MOUTH INTO LUNGS TWICE DAILY. MAY INCREASE TO TWO PUFFS  One Touch Lancets (has enough on hand) One Touch Test Strips   Patient does not needs refills at this time.  Confirmed delivery date of 05/30/20, advised patient that pharmacy will contact them the morning of delivery.  Follow-Up:Pharmacist Review  Charlann Lange, RMA Clinical Pharmacist Assistant [phone]  10 minutes spent in review, coordination, and documentation.  Reviewed by: Beverly Milch, PharmD Clinical Pharmacist [facility] [phone]

## 2020-05-28 NOTE — Progress Notes (Signed)
Subjective:   Sherry [name] is a 70 y.o. female who presents for Medicare Annual (Subsequent) preventive examination.  Review of Systems     Cardiac Risk Factors include: advanced age (>54mn, >>37women);diabetes mellitus;dyslipidemia;hypertension;obesity (BMI >30kg/m2);sedentary lifestyle     Objective:    Today's Vitals   05/29/20 0840 05/29/20 0845  BP: (!) 142/84   Pulse: 76   Resp: 16   Temp: 97.6 F (36.4 C)   TempSrc: Temporal   SpO2: 97%   Weight: 189 lb 3.2 oz (85.8 kg)   Height: 5' 1" (1.549 m)   PainSc:  4    Body mass index is 35.75 kg/m.  Advanced Directives 05/29/2020 09/04/2019 04/12/2019 03/22/2019 04/10/2018 11/30/2017 11/21/2017  Does Patient Have a Medical Advance Directive? _0  No No  Type of Advance Directive - - - - - - -  Would patient like information on creating a medical advance directive? Yes (MAU/Ambulatory/Procedural Areas - Information given) - No - Patient declined No - Patient declined No - Patient declined No - Patient declined No - Patient declined  Pre-existing out of facility DNR order (yellow form or pink MOST form) - - - - - - -    Current Medications (verified) Outpatient Encounter Medications as of 05/29/2020  Medication Sig  . albuterol (VENTOLIN HFA) 108 (90 Base) MCG/ACT inhaler Inhale 2 puffs into the lungs every 6 (six) hours as needed for wheezing or shortness of breath.  .Marland Kitchenamoxicillin (AMOXIL) 500 MG capsule TAKE FOUR CAPSULES BY MOUTH ONCE FOR ONE DOSE prior TO dental work  . blood glucose meter kit and supplies KIT Dispense based on patient and insurance preference. Use up to four times daily as directed. (FOR ICD-9 250.00, 250.01). Pt uses once a day  . Blood Glucose Monitoring Suppl (ONE TOUCH ULTRA 2) w/Device KIT Use to check glucose up to twice daily. E11.9 dx code  . cetirizine (ZYRTEC) 10 MG tablet Take 1 tablet (10 mg total) by mouth daily.  . clopidogrel (PLAVIX) 75 MG tablet Take 1 tablet (75 mg total) by  mouth daily.  . empagliflozin (JARDIANCE) 10 MG TABS tablet Take 1 tablet (10 mg total) by mouth daily. Take 0.5 tablets (5 mg total) daily.  . famotidine (PEPCID) 20 MG tablet Take 20 mg by mouth daily.   . fluticasone (FLOVENT HFA) 220 MCG/ACT inhaler Inhale 1 puff into the lungs 2 (two) times daily. May increase to 2 puffs if needed  . furosemide (LASIX) 20 MG tablet Take 1 tablet (20 mg total) by mouth daily.  .Marland Kitchengabapentin (NEURONTIN) 300 MG capsule TAKE 2 CAPSULES BY MOUTH TWO TIMES DAILY  . glucose blood (ACCU-CHEK AVIVA PLUS) test strip Use as instructed  . glucose blood (ONETOUCH ULTRA) test strip Use to check glucose up to twice daily. E11.9 dx code  . ipratropium (ATROVENT) 0.03 % nasal spray Place 2 sprays into the nose 4 (four) times daily.  . Lancets Misc. (ACCU-CHEK SOFTCLIX LANCET DEV) KIT USE AS DIRECTED  . linagliptin (TRADJENTA) 5 MG TABS tablet TAKE 1/2 TABLET BY MOUTH DAILY *NEED OFFICE VISIT*  . lisinopril (ZESTRIL) 20 MG tablet Take 1 tablet (20 mg total) by mouth daily.  . metFORMIN (GLUCOPHAGE) 500 MG tablet TAKE 2 TABLETS BY MOUTH TWO TIMES DAILY  . metoprolol tartrate (LOPRESSOR) 25 MG tablet Take 1 tablet (25 mg total) by mouth daily.  . Multiple Vitamins-Minerals (MULTIVITAMIN PO) Take 2 tablets by mouth daily.  .Glory RosebushDelica Lancets 306GMISC  Use to check glucose up to twice daily. E11.9 dx code  . oxyCODONE-acetaminophen (PERCOCET/ROXICET) 5-325 MG tablet Take 0.5 tablets by mouth every 4 (four) hours as needed for severe pain (pain).   No facility-administered encounter medications on file as of 05/29/2020.    Allergies (verified) Influenza vaccines, Influenza virus vaccine, Demerol, Nsaids, Prednisone, Tramadol, Codeine, and Mango flavor [flavoring agent]   History: Past Medical History:  Diagnosis Date  . Asthma   . Cerebral aneurysm   . Complication of anesthesia HYPOTENSION INTRAOP W/ HYSTERECTOMY IN 1987--  DID OK W/ TOTAL KNEE IN 2008  . Diabetes  mellitus   . DJD (degenerative joint disease)   . Dyslipidemia   . GERD (gastroesophageal reflux disease)   . H/O hiatal hernia   . History of CHF (congestive heart failure) 2010-  RESOLVED  . History of peptic ulcer YRS AGO  . Hypertension   . Meniere's disease   . Mitral valve prolapse occasional palpitations w/ chest discomfort  . OA (osteoarthritis)   . Rotator cuff tear, left   . Stroke [facility])    Past Surgical History:  Procedure Laterality Date  . BUNIONECTOMY  2007   LEFT FOOT  . IR ANGIO INTRA EXTRACRAN SEL COM CAROTID INNOMINATE BILAT MOD SED  03/22/2019  . IR ANGIO VERTEBRAL SEL SUBCLAVIAN INNOMINATE UNI L MOD SED  03/22/2019  . IR ANGIO VERTEBRAL SEL VERTEBRAL UNI R MOD SED  03/22/2019  . IR US GUIDE VASC ACCESS RIGHT  03/22/2019  . LEFT SHOULDER SURGERY  1997   ROTATOR CUFF REPAIR  . SHOULDER ARTHROSCOPY  09/20/2011   Procedure: ARTHROSCOPY SHOULDER;  Surgeon: Johnn Hai, MD;  Location: [facility];  Service: Orthopedics;  Laterality: Left;  SAD AND MINI OPEN ROTATOR CUFF REPAIR    . TOTAL KNEE ARTHROPLASTY  09-12-2006  [facility]   RIGHT KNEE  . TOTAL KNEE ARTHROPLASTY Left 11/30/2017   Procedure: LEFT TOTAL KNEE REPLACEMENT;  Surgeon: [name], MD;  Location: [facility];  Service: Orthopedics;  Laterality: Left;  . TUBAL LIGATION  1976  . TYMPANOPLASTY  1990;   1980  . VAGINAL HYSTERECTOMY  1987   Family History  Problem Relation Age of Onset  . Heart disease Mother        in her 54s, heart attack  . Diabetes Mother   . Kidney disease Mother        dialysis  . Thyroid disease Mother   . Aneurysm Mother   . Ovarian cancer Mother   . Breast cancer Mother   . Thyroid disease Sister   . Heart disease Sister   . Lymphoma Father   . Diabetes Father   . Hypertension Father   . Congestive Heart Failure Father   . Thyroid disease Brother   . Thyroid disease Paternal Grandmother   . Heart attack Sister        at age 39  . Stroke Sister   .  Hypertension Sister   . Breast cancer Maternal Aunt   . Breast cancer Maternal Grandmother   . Breast cancer Maternal Aunt   . Breast cancer Maternal Aunt   . Other Brother        Musician accident  . Bipolar disorder Son   . Other Son        GSW   Social History   Socioeconomic History  . Marital status: Widowed    Spouse name: Not on file  . Number of children: 4  . Years  of education: BA  . Highest education level: Not on file  Occupational History  . Occupation: LPN - retired  Tobacco Use  . Smoking status: Never Smoker  . Smokeless tobacco: Never Used  Vaping Use  . Vaping Use: Never used  Substance and Sexual Activity  . Alcohol use: No  . Drug use: No  . Sexual activity: Not on file  Other Topics Concern  . Not on file  Social History Narrative  . Not on file   Social Determinants of Health   Financial Resource Strain: Low Risk   . Difficulty of Paying Living Expenses: Not hard at all  Food Insecurity: No Food Insecurity  . Worried About Charity fundraiser in the Last Year: Never true  . Ran Out of Food in the Last Year: Never true  Transportation Needs: No Transportation Needs  . Lack of Transportation (Medical): No  . Lack of Transportation (Non-Medical): No  Physical Activity: Inactive  . Days of Exercise per Week: 0 days  . Minutes of Exercise per Session: 0 min  Stress: Stress Concern Present  . Feeling of Stress : To some extent  Social Connections: Moderately Integrated  . Frequency of Communication with Friends and Family: More than three times a week  . Frequency of Social Gatherings with Friends and Family: More than three times a week  . Attends Religious Services: More than 4 times per year  . Active Member of Clubs or Organizations: Yes  . Attends Archivist Meetings: 1 to 4 times per year  . Marital Status: Widowed    Tobacco Counseling Counseling given: Not Answered   Clinical Intake:  Pre-visit preparation completed:  Yes  Pain : 0-10 Pain Score: 4  Pain Type: Chronic pain Pain Location: Generalized (arthritis) Pain Onset: More than a month ago Pain Frequency: Constant     Nutritional Status: BMI > 30  Obese Nutritional Risks: None Diabetes: Yes CBG done?: No Did pt. bring in CBG monitor from home?: No  How often do you need to have someone help you when you read instructions, pamphlets, or other written materials from your doctor or pharmacy?: 1 - Never  Diabetes:  Is the patient diabetic?  Yes  If diabetic, was a CBG obtained today?  No  Did the patient bring in their glucometer from home?  No  How often do you monitor your CBG's? occasionally.   Financial Strains and Diabetes Management:  Are you having any financial strains with the device, your supplies or your medication? No .  Does the patient want to be seen by Chronic Care Management for management of their diabetes?  No  Would the patient like to be referred to a Nutritionist or for Diabetic Management?  No   Diabetic Exams:  Diabetic Eye Exam: . Overdue for diabetic eye exam. Pt has been advised about the importance in completing this exam. Patient will make an appt soon.  Diabetic Foot Exam:  Pt has been advised about the importance in completing this exam. To be completed by PCP    Interpreter Needed?: No  Information entered by :: Caroleen Hamman LPN   Activities of Daily Living In your present state of health, do you have any difficulty performing the following activities: 05/29/2020  Hearing? N  Vision? N  Difficulty concentrating or making decisions? Y  Comment occasionally  Walking or climbing stairs? N  Dressing or bathing? N  Doing errands, shopping? N  Preparing Food and eating ? N  Using the Toilet? N  In the past six months, have you accidently leaked urine? N  Do you have problems with loss of bowel control? N  Managing your Medications? N  Managing your Finances? N  Housekeeping or managing your  Housekeeping? N  Some recent data might be hidden    Patient Care Team: Copland, Gay Filler, MD as PCP - General (Family Medicine) Debara Pickett Nadean Corwin, MD as PCP - Cardiology (Cardiology) Day, Melvenia Beam, [facility] (Inactive) as Pharmacist (Pharmacist)  Indicate any recent Medical Services you may have received from other than [facility] providers in the past year (date may be approximate).     Assessment:   This is a routine wellness examination for Iridiana.  Hearing/Vision screen  Hearing Screening   '125Hz'$  $Remo'250Hz'QXxxl$'500Hz'$'1000Hz'$'2000Hz'$'3000Hz'$'4000Hz'$'6000Hz'$'8000Hz'$   Right ear:           Left ear:           Comments: Mild hearing loss  Vision Screening Comments: Wears glasses Last eye exam-03/2019-Dr. Dunn  Dietary issues and exercise activities discussed: Current Exercise Habits: The patient does not participate in regular exercise at present, Exercise limited by: orthopedic condition(s)  Goals    . Chronic Care Management Pharmacy Care Plan     CARE PLAN ENTRY (see longitudinal plan of care for additional care plan information)  Current Barriers:  . Chronic Disease Management support, education, and care coordination needs related to Asthma, Diabetes, Hypertension, Hx of Heart Failure, Hyperlipidemia/Hx of TIA, Depression, Pain   Hypertension BP Readings from Last 3 Encounters:  09/06/19 (!) 107/52  09/04/19 (!) 116/61  08/02/19 120/68   . Pharmacist Clinical Goal(s): o Over the next 90 days, patient will work with PharmD and providers to maintain BP goal <140/90 . Current regimen:   Lisinopril $RemoveBef'20mg'hHrztIYPrX$  daily  Metoprolol $RemoveBef'25mg'NSJtdrdphf$  1/2 tab twice daily . Interventions: o Requested patient to check her BP 3-4 times per week and reco . Patient self care activities - Over the next 90 days, patient will: o Check BP 3-4 times per week, document, and provide at future appointments o Ensure daily salt intake < 2300 mg/day  Hyperlipidemia Lab Results  Component Value Date/Time   LDLCALC 56 04/16/2019  03:56 PM   . Pharmacist Clinical Goal(s): o Over the next 90 days, patient will work with PharmD and providers to maintain LDL goal < 70 . Current regimen:  o Diet and exercise management   . Interventions: o Consider initiation of rosuvastatin $RemoveBeforeD'10mg'gfNnPCMeVqYUBm$  daily to help reduce risk of future TIA, stroke, or heart attack . Patient self care activities - Over the next 90 days, patient will: o Maintain LDL goal <70  Diabetes Lab Results  Component Value Date/Time   HGBA1C 6.2 04/16/2019 03:56 PM   HGBA1C 6.1 02/15/2018 10:24 AM   . Pharmacist Clinical Goal(s): o Over the next 90 days, patient will work with PharmD and providers to maintain A1c goal <7% . Current regimen:   Tradjenta $RemoveBe'5mg'qoNQtShnz$  1/2 tab daily  Jardiance $RemoveBe'10mg'jxzYBdevX$  1/2 tab daily   Metformin $RemoveBe'500mg'oZKTFEirc$  #2 twice daily . Interventions: o Discussed possibility of deprescribing noting patient a1c has ben <6.5% at last two lab draws . Patient self care activities - Over the next 90 days, patient will: o Maintain diabetes medication regimen  Depression . Pharmacist Clinical Goal(s) o Over the next 90 days, patient will work with PharmD and providers to reduce symptoms of depression and simplify medication regimen . Current regimen:   Quetiapine $RemoveBef'25mg'zHWiShOvjP$  daily  Sertraline 51m daily . Interventions: o Collaboration with provider regarding medication management (quetiapine 275mand sertraline 2559maily to replace current regimen) . Patient self care activities - Over the next 90 days, patient will: o Maintain depression medication reigmen  Medication management . Pharmacist Clinical Goal(s): o Over the next 90 days, patient will work with PharmD and providers to achieve optimal medication adherence . Current [facility]: [facility]itched [facility] . Interventions o Comprehensive medication review performed. o Utilize [facility] pharmacy for medication synchronization, packaging and delivery . Patient self care activities - Over the next 90 days,  patient will: o Focus on medication adherence by filling medications appropriately  o Take medications as prescribed o Report any questions or concerns to PharmD and/or provider(s)  Please see past updates related to this goal by clicking on the "Past Updates" button in the selected goal      . Increase physical activity    . Patient Stated     Eat healthier & drink more water      Depression Screen PHQ 2/9 Scores 05/29/2020 06/21/2019 04/12/2019 04/10/2018 12/06/2016 02/18/2015 11/20/2014  PHQ - 2 Score 1 0 1 0 _0 PHQ- 9 Score - - - - _1 Exception Documentation - - - - Other- indicate reason in comment box - -  Not completed - - - - Loss of loved ones - -    Fall Risk Fall Risk  05/29/2020 04/12/2019 04/10/2018 02/14/2017 12/06/2016  Falls in the past year? 1 0 0 Yes Yes  Number falls in past yr: 0 0 - 2 or more 1  Comment - - - 3 falls -  Injury with Fall? 1 0 - No Yes  Risk for fall due to : History of fall(s) - - - -  Follow up Falls prevention discussed Education provided;Falls prevention discussed - - -    FALL RISK PREVENTION PERTAINING TO THE HOME:  Any stairs in or around the home? Yes  If so, are there any without handrails? Yes pt aware os fall risk-Community resource referral placed to assist with repair if possible. Home free of loose throw rugs in walkways, pet beds, electrical cords, etc? Yes  Adequate lighting in your home to reduce risk of falls? Yes   ASSISTIVE DEVICES UTILIZED TO PREVENT FALLS:  Life alert? No  Use of a cane, walker or w/c? No  Grab bars in the bathroom? Yes  Shower chair or bench in shower? No  Elevated toilet seat or a handicapped toilet? No   TIMED UP AND GO:  Was the test performed? Yes .  Length of time to ambulate 10 feet: 10 sec.   Gait steady and fast without use of assistive device  Cognitive Function:Normal cognitive status assessed by direct observation by this Nurse Health Advisor. No abnormalities found.   MMSE - Mini  Mental State Exam 04/10/2018  Orientation to time 5  Orientation to Place 5  Registration 3  Attention/ Calculation 5  Recall 3  Language- name 2 objects 2  Language- repeat 1  Language- follow 3 step command 3  Language- read & follow direction 1  Write a sentence 1  Copy design 1  Total score 30     6CIT Screen 05/29/2020  What Year? 0 points  What month? 0 points  What time? 0 points  Count back from 20 0 points  Months in reverse 0 points  Repeat phrase 0 points  Total Score 0  Immunizations Immunization History  Administered Date(s) Administered  . PPD Test 06/29/2013, 02/18/2015, 04/25/2018  . Pneumococcal Conjugate-13 12/06/2016  . Pneumococcal Polysaccharide-23 11/21/2009, 02/15/2018    TDAP status: Due, Education has been provided regarding the importance of this vaccine. Advised may receive this vaccine at local pharmacy or Health Dept. Aware to provide a copy of the vaccination record if obtained from local pharmacy or Health Dept. Verbalized acceptance and understanding.  Flu vaccine status: Allergic  Pneumococcal vaccine status: Up to date  Covid vaccine status: Patient is allergic to flu vaccine & has been advised not to take Covid vaccines  Qualifies for Shingles Vaccine? Yes   Zostavax completed No   Shingrix Completed?: No.    Education has been provided regarding the importance of this vaccine. Patient has been advised to call insurance company to determine out of pocket expense if they have not yet received this vaccine. Advised may also receive vaccine at local pharmacy or Health Dept. Verbalized acceptance and understanding.  Screening Tests Health Maintenance  Topic Date Due  . COVID-19 Vaccine (1) Never done  . TETANUS/TDAP  Never done  . HEMOGLOBIN A1C  10/17/2019  . OPHTHALMOLOGY EXAM  03/11/2020  . FOOT EXAM  04/15/2020  . MAMMOGRAM  04/19/2021  . COLONOSCOPY (Pts 45-34yrs Insurance coverage will need to be confirmed)  03/05/2026  . DEXA  SCAN  Completed  . Hepatitis C Screening  Completed  . PNA vac Low Risk Adult  Completed  . HPV VACCINES  Aged Out  . INFLUENZA VACCINE  Discontinued    Health Maintenance  Health Maintenance Due  Topic Date Due  . COVID-19 Vaccine (1) Never done  . TETANUS/TDAP  Never done  . HEMOGLOBIN A1C  10/17/2019  . OPHTHALMOLOGY EXAM  03/11/2020  . FOOT EXAM  04/15/2020    Colorectal cancer screening: Type of screening: Colonoscopy. Completed 03/05/2016. Repeat every 10 years  Mammogram status: Scheduled for 06/04/2020  Bone Density status: Completed 04/20/2019. Results reflect: Bone density results: NORMAL. Repeat every 2-3 years.  Lung Cancer Screening: (Low Dose CT Chest recommended if Age 16-80 years, 30 pack-year currently smoking OR have quit w/in 15years.) does not qualify.    Additional Screening:  Hepatitis C Screening: Completed 12/18/2015  Vision Screening: Recommended annual ophthalmology exams for early detection of glaucoma and other disorders of the eye. Is the patient up to date with their annual eye exam?  No  Who is the provider or what is the name of the office in which the patient attends annual eye exams? Dr. Idolina Primer   Dental Screening: Recommended annual dental exams for proper oral hygiene  Community Resource Referral / Chronic Care Management: CRR required this visit?  Yes   For assistance repairing handrail  CCM required this visit?  No      Plan:     I have personally reviewed and noted the following in the patient's chart:   . Medical and social history . Use of alcohol, tobacco or illicit drugs  . Current medications and supplements . Functional ability and status . Nutritional status . Physical activity . Advanced directives . List of other physicians . Hospitalizations, surgeries, and ER visits in previous 12 months . Vitals . Screenings to include cognitive, depression, and falls . Referrals and appointments  In addition, I have reviewed  and discussed with patient certain preventive protocols, quality metrics, and best practice recommendations. A written personalized care plan for preventive services as well as general preventive health recommendations were provided to  patient.   Patient would like to access avs on mychart.  Marta Antu, LPN   [id number]  Nurse Health Advisor  Nurse Notes: None

## 2020-05-29 ENCOUNTER — Ambulatory Visit (INDEPENDENT_AMBULATORY_CARE_PROVIDER_SITE_OTHER): Payer: Medicare Other

## 2020-05-29 ENCOUNTER — Other Ambulatory Visit: Payer: Self-pay

## 2020-05-29 VITALS — BP 142/84 | HR 76 | Temp 97.6°F | Resp 16 | Ht 61.0 in | Wt 189.2 lb

## 2020-05-29 DIAGNOSIS — Z0000 Encounter for general adult medical examination without abnormal findings: Secondary | ICD-10-CM

## 2020-05-29 NOTE — Patient Instructions (Signed)
Sherry [name] , Thank you for taking time to come for your Medicare Wellness Visit. I appreciate your ongoing commitment to your health goals. Please review the following plan we discussed and let me know if I can assist you in the future.   Screening recommendations/referrals: Colonoscopy: Completed 03/05/2016-Due 03/05/2026 Mammogram: Scheduled for 06/04/20 Bone Density: Completed 04/20/2019-Due 04/19/2021 Recommended yearly ophthalmology/optometry visit for glaucoma screening and checkup Recommended yearly dental visit for hygiene and checkup  Vaccinations: Influenza vaccine: Allergic Pneumococcal vaccine: Completed vaccines Tdap vaccine: Discuss with pharmacy Shingles vaccine: Discuss with pharmacy   Covid-19:Not taking die to flu vaccine allergy  Advanced directives: Information given today  Conditions/risks identified: See problem list  Next appointment: Follow up in one year for your annual wellness visit 06/04/2021 @ 9:00   Preventive Care 70 Years and Older, Female Preventive care refers to lifestyle choices and visits with your health care provider that can promote health and wellness. What does preventive care include?  A yearly physical exam. This is also called an annual well check.  Dental exams once or twice a year.  Routine eye exams. Ask your health care provider how often you should have your eyes checked.  Personal lifestyle choices, including:  Daily care of your teeth and gums.  Regular physical activity.  Eating a healthy diet.  Avoiding tobacco and drug use.  Limiting alcohol use.  Practicing safe sex.  Taking low-dose aspirin every day.  Taking vitamin and mineral supplements as recommended by your health care provider. What happens during an annual well check? The services and screenings done by your health care provider during your annual well check will depend on your age, overall health, lifestyle risk factors, and family history of  disease. Counseling  Your health care provider may ask you questions about your:  Alcohol use.  Tobacco use.  Drug use.  Emotional well-being.  Home and relationship well-being.  Sexual activity.  Eating habits.  History of falls.  Memory and ability to understand (cognition).  Work and work Statistician.  Reproductive health. Screening  You may have the following tests or measurements:  Height, weight, and BMI.  Blood pressure.  Lipid and cholesterol levels. These may be checked every 5 years, or more frequently if you are over 18 years old.  Skin check.  Lung cancer screening. You may have this screening every year starting at age 88 if you have a 30-pack-year history of smoking and currently smoke or have quit within the past 15 years.  Fecal occult blood test (FOBT) of the stool. You may have this test every year starting at age 84.  Flexible sigmoidoscopy or colonoscopy. You may have a sigmoidoscopy every 5 years or a colonoscopy every 10 years starting at age 16.  Hepatitis C blood test.  Hepatitis B blood test.  Sexually transmitted disease (STD) testing.  Diabetes screening. This is done by checking your blood sugar (glucose) after you have not eaten for a while (fasting). You may have this done every 1-3 years.  Bone density scan. This is done to screen for osteoporosis. You may have this done starting at age 23.  Mammogram. This may be done every 1-2 years. Talk to your health care provider about how often you should have regular mammograms. Talk with your health care provider about your test results, treatment options, and if necessary, the need for more tests. Vaccines  Your health care provider may recommend certain vaccines, such as:  Influenza vaccine. This is recommended every year.  Tetanus, diphtheria, and acellular pertussis (Tdap, Td) vaccine. You may need a Td booster every 10 years.  Zoster vaccine. You may need this after age  4.  Pneumococcal 13-valent conjugate (PCV13) vaccine. One dose is recommended after age 106.  Pneumococcal polysaccharide (PPSV23) vaccine. One dose is recommended after age 30. Talk to your health care provider about which screenings and vaccines you need and how often you need them. This information is not intended to replace advice given to you by your health care provider. Make sure you discuss any questions you have with your health care provider. Document Released: 02/21/2015 Document Revised: 10/15/2015 Document Reviewed: 11/26/2014 Elsevier Interactive Patient Education  2017 [employer] Prevention in the Home Falls can cause injuries. They can happen to people of all ages. There are many things you can do to make your home safe and to help prevent falls. What can I do on the outside of my home?  Regularly fix the edges of walkways and driveways and fix any cracks.  Remove anything that might make you trip as you walk through a door, such as a raised step or threshold.  Trim any bushes or trees on the path to your home.  Use bright outdoor lighting.  Clear any walking paths of anything that might make someone trip, such as rocks or tools.  Regularly check to see if handrails are loose or broken. Make sure that both sides of any steps have handrails.  Any raised decks and porches should have guardrails on the edges.  Have any leaves, snow, or ice cleared regularly.  Use sand or salt on walking paths during winter.  Clean up any spills in your garage right away. This includes oil or grease spills. What can I do in the bathroom?  Use night lights.  Install grab bars by the toilet and in the tub and shower. Do not use towel bars as grab bars.  Use non-skid mats or decals in the tub or shower.  If you need to sit down in the shower, use a plastic, non-slip stool.  Keep the floor dry. Clean up any water that spills on the floor as soon as it happens.  Remove  soap buildup in the tub or shower regularly.  Attach bath mats securely with double-sided non-slip rug tape.  Do not have throw rugs and other things on the floor that can make you trip. What can I do in the bedroom?  Use night lights.  Make sure that you have a light by your bed that is easy to reach.  Do not use any sheets or blankets that are too big for your bed. They should not hang down onto the floor.  Have a firm chair that has side arms. You can use this for support while you get dressed.  Do not have throw rugs and other things on the floor that can make you trip. What can I do in the kitchen?  Clean up any spills right away.  Avoid walking on wet floors.  Keep items that you use a lot in easy-to-reach places.  If you need to reach something above you, use a strong step stool that has a grab bar.  Keep electrical cords out of the way.  Do not use floor polish or wax that makes floors slippery. If you must use wax, use non-skid floor wax.  Do not have throw rugs and other things on the floor that can make you trip. What can I do  with my stairs?  Do not leave any items on the stairs.  Make sure that there are handrails on both sides of the stairs and use them. Fix handrails that are broken or loose. Make sure that handrails are as long as the stairways.  Check any carpeting to make sure that it is firmly attached to the stairs. Fix any carpet that is loose or worn.  Avoid having throw rugs at the top or bottom of the stairs. If you do have throw rugs, attach them to the floor with carpet tape.  Make sure that you have a light switch at the top of the stairs and the bottom of the stairs. If you do not have them, ask someone to add them for you. What else can I do to help prevent falls?  Wear shoes that:  Do not have high heels.  Have rubber bottoms.  Are comfortable and fit you well.  Are closed at the toe. Do not wear sandals.  If you use a  stepladder:  Make sure that it is fully opened. Do not climb a closed stepladder.  Make sure that both sides of the stepladder are locked into place.  Ask someone to hold it for you, if possible.  Clearly mark and make sure that you can see:  Any grab bars or handrails.  First and last steps.  Where the edge of each step is.  Use tools that help you move around (mobility aids) if they are needed. These include:  Canes.  Walkers.  Scooters.  Crutches.  Turn on the lights when you go into a dark area. Replace any light bulbs as soon as they burn out.  Set up your furniture so you have a clear path. Avoid moving your furniture around.  If any of your floors are uneven, fix them.  If there are any pets around you, be aware of where they are.  Review your medicines with your doctor. Some medicines can make you feel dizzy. This can increase your chance of falling. Ask your doctor what other things that you can do to help prevent falls. This information is not intended to replace advice given to you by your health care provider. Make sure you discuss any questions you have with your health care provider. Document Released: 11/21/2008 Document Revised: 07/03/2015 Document Reviewed: 03/01/2014 Elsevier Interactive Patient Education  2017 [employer].

## 2020-05-30 NOTE — Patient Instructions (Addendum)
Good to see you again today- I will be in touch with your labs asap  I will also touch base with neurology about your Plaix- there is some risk to stopping plavix but it may be worth it to you in order to improve your shoulder function  Take care- I expect we will be able to get you approved for your surgery

## 2020-05-30 NOTE — Progress Notes (Addendum)
[facility] [address], [place], [place] [address] [phone]  Date:  06/02/2020   Name:  ANIVEA VELASQUES   DOB:  04/26/1950   MRN:  [id number]  PCP:  Darreld Mclean, MD    Chief Complaint: Surgical Clearance (Should repair-right shoulder)   History of Present Illness:  KASSONDRA GEIL is a 70 y.o. very pleasant female patient who presents with the following:  Pre-operative eval today-  She plans to have a shoulder procedure per Dr Erroll Luna- shoulder replacement   Last seen by myself 6/21 History of TIA 2016, hypertension, intracranial vascular stenosis, hypothyroidism, diabetes with peripheral neuropathy, CHF, hyperlipidemia Her vascular issues are followed by [facility] radiology Also history of brain aneurysm, most recent MRI 9/21: IMPRESSION: 1. No acute intracranial abnormality. Stable and negative MRI appearance of the brain. 2. 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 3. 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 4. 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  She did see cardiology already to discuss her operation-  Pre-Op Evaluation - Patient has upcoming shoulder surgery planned. - Patient stable from a cardiac standpoint. No angina, acute CHF symptoms, or syncope. Myoview last year in 05/2019 was low risk with no evidence of ischemia. - Per Revised Risk Index, considered low risk with 0.9% chance of adverse cardiac complications. Able to complete >4.0 METS. Therefore, based on ACC/AHA guidelines, patient would be at acceptable risk for the planned procedure without  further cardiovascular testing. OK to hold Plavix for 5 days from a cardiac standpoint; however, patient on Plavix for recurrent TIAs so would ultimately defer this to PCP/Neurology. I will route this recommendation to the requesting party via Epic fax function  covid series- not done due to history of anaphylaxis with flu shot  Tetanus-see above A1c due- will update today  Eye exam- she plans to do this  Foot exam- today  mammo scheduled  She is right handed  She has limited range of motion and function with her right shoulder.  She is still a bit hesitant to undergo surgery, she is also thinking about trying physical therapy first.  She is having to recertify for surgery due to an insurance delay.  Looking back, she did see neurology for preop clearance back in November- Trezure Cronk McCue NP: Cleared to proceed with surgical procedure of right shoulder replacement from a stroke standpoint as she has been stable since her prior TIA and 2017 without new or reoccurring stroke/TIA symptoms.  Discussed holding Plavix for 3 to 5 days or as advised by orthopedic surgeon with small but acceptable preprocedure risk of stroke.  Advised to restart Plavix immediately after procedure or once hemodynamically stable and advised by orthopedic surgeon.  This was fully discussed with patient and she verbalized understanding.  I have sent a message to Ms. McCue to see if any change in recommendation, however assume will be the same    Lab Results  Component Value Date   HGBA1C 6.2 04/16/2019    Patient Active Problem List   Diagnosis Date Noted  . Traumatic hematoma of left lower leg 09/26/2018  . S/P total knee arthroplasty, left 03/29/2017  . Spondylosis  without myelopathy or radiculopathy, cervical region 03/29/2017  . Bilateral carpal tunnel syndrome 03/29/2017  . Acute asthma exacerbation 08/19/2016  . Cervicalgia 03/23/2016  . Vertigo 04/14/2015  . Anxiety state 01/22/2015  . HLD (hyperlipidemia)  01/22/2015  . Type 2 diabetes mellitus with circulatory disorder ([place]) 01/22/2015  . Intracranial vascular stenosis 01/22/2015  . Aneurysm ([place])   . Headache   . Essential hypertension 11/12/2014  . Dependent edema 11/12/2014  . Peripheral neuropathy 11/12/2014  . Depression 11/12/2014  . Facial droop   . TIA (transient ischemic attack) 11/11/2014  . Hyperthyroidism 08/06/2014  . Meniere's disease 06/07/2014  . Goiter 06/13/2013  . Obesity, unspecified 06/13/2013    Past Medical History:  Diagnosis Date  . Asthma   . Cerebral aneurysm   . Complication of anesthesia HYPOTENSION INTRAOP W/ HYSTERECTOMY IN 1987--  DID OK W/ TOTAL KNEE IN 2008  . Diabetes mellitus   . DJD (degenerative joint disease)   . Dyslipidemia   . GERD (gastroesophageal reflux disease)   . H/O hiatal hernia   . History of CHF (congestive heart failure) 2010-  RESOLVED  . History of peptic ulcer YRS AGO  . Hypertension   . Meniere's disease   . Mitral valve prolapse occasional palpitations w/ chest discomfort  . OA (osteoarthritis)   . Rotator cuff tear, left   . Stroke [facility])     Past Surgical History:  Procedure Laterality Date  . BUNIONECTOMY  2007   LEFT FOOT  . IR ANGIO INTRA EXTRACRAN SEL COM CAROTID INNOMINATE BILAT MOD SED  03/22/2019  . IR ANGIO VERTEBRAL SEL SUBCLAVIAN INNOMINATE UNI L MOD SED  03/22/2019  . IR ANGIO VERTEBRAL SEL VERTEBRAL UNI R MOD SED  03/22/2019  . IR US GUIDE VASC ACCESS RIGHT  03/22/2019  . LEFT SHOULDER SURGERY  1997   ROTATOR CUFF REPAIR  . SHOULDER ARTHROSCOPY  09/20/2011   Procedure: ARTHROSCOPY SHOULDER;  Surgeon: Johnn Hai, MD;  Location: [facility];  Service: Orthopedics;  Laterality: Left;  SAD AND MINI OPEN ROTATOR CUFF REPAIR    . TOTAL KNEE ARTHROPLASTY  09-12-2006  [facility]   RIGHT KNEE  . TOTAL KNEE ARTHROPLASTY Left 11/30/2017   Procedure: LEFT TOTAL KNEE REPLACEMENT;  Surgeon: [name], MD;  Location: [facility];  Service: Orthopedics;   Laterality: Left;  . TUBAL LIGATION  1976  . TYMPANOPLASTY  1990;   1980  . VAGINAL HYSTERECTOMY  1987    Social History   Tobacco Use  . Smoking status: Never Smoker  . Smokeless tobacco: Never Used  Vaping Use  . Vaping Use: Never used  Substance Use Topics  . Alcohol use: No  . Drug use: No    Family History  Problem Relation Age of Onset  . Heart disease Mother        in her 34s, heart attack  . Diabetes Mother   . Kidney disease Mother        dialysis  . Thyroid disease Mother   . Aneurysm Mother   . Ovarian cancer Mother   . Breast cancer Mother   . Thyroid disease Sister   . Heart disease Sister   . Lymphoma Father   . Diabetes Father   . Hypertension Father   . Congestive Heart Failure Father   . Thyroid disease Brother   . Thyroid disease Paternal Grandmother   . Heart attack Sister        at age 45  . Stroke Sister   .  Hypertension Sister   . Breast cancer Maternal Aunt   . Breast cancer Maternal Grandmother   . Breast cancer Maternal Aunt   . Breast cancer Maternal Aunt   . Other Brother        Musician accident  . Bipolar disorder Son   . Other Son        GSW    Allergies  Allergen Reactions  . Influenza Vaccines Anaphylaxis  . Influenza Virus Vaccine Anaphylaxis  . Demerol Other (See Comments)    SEIZURE  . Nsaids Other (See Comments)    NAPROXEN, IBUPROFEN, SKELAXIN---  CAUSES PALPITATIONS  . Prednisone Other (See Comments)    Caused her glucose to go up to 500 and went to ED  . Tramadol Other (See Comments)    Contraindicated with Victoza   . Codeine Itching  . Mango Flavor [Flavoring Agent] Itching    Medication list has been reviewed and updated.  Current Outpatient Medications on File Prior to Visit  Medication Sig Dispense Refill  . albuterol (VENTOLIN HFA) 108 (90 Base) MCG/ACT inhaler Inhale 2 puffs into the lungs every 6 (six) hours as needed for wheezing or shortness of breath. 18 g 2  . amoxicillin (AMOXIL) 500 MG capsule  TAKE FOUR CAPSULES BY MOUTH ONCE FOR ONE DOSE prior TO dental work    . blood glucose meter kit and supplies KIT Dispense based on patient and insurance preference. Use up to four times daily as directed. (FOR ICD-9 250.00, 250.01). Pt uses once a day 1 each 0  . Blood Glucose Monitoring Suppl (ONE TOUCH ULTRA 2) w/Device KIT Use to check glucose up to twice daily. E11.9 dx code 1 kit 0  . cetirizine (ZYRTEC) 10 MG tablet Take 1 tablet (10 mg total) by mouth daily. 90 tablet 3  . clopidogrel (PLAVIX) 75 MG tablet Take 1 tablet (75 mg total) by mouth daily. 90 tablet 3  . empagliflozin (JARDIANCE) 10 MG TABS tablet Take 1 tablet (10 mg total) by mouth daily. Take 0.5 tablets (5 mg total) daily. 90 tablet 3  . famotidine (PEPCID) 20 MG tablet Take 20 mg by mouth daily.     . fluticasone (FLOVENT HFA) 220 MCG/ACT inhaler Inhale 1 puff into the lungs 2 (two) times daily. May increase to 2 puffs if needed 12 g 2  . furosemide (LASIX) 20 MG tablet Take 1 tablet (20 mg total) by mouth daily. 90 tablet 3  . gabapentin (NEURONTIN) 300 MG capsule TAKE 2 CAPSULES BY MOUTH TWO TIMES DAILY 360 capsule 3  . glucose blood (ACCU-CHEK AVIVA PLUS) test strip Use as instructed 100 each 12  . glucose blood (ONETOUCH ULTRA) test strip Use to check glucose up to twice daily. E11.9 dx code 200 each 6  . ipratropium (ATROVENT) 0.03 % nasal spray Place 2 sprays into the nose 4 (four) times daily. 60 mL 1  . Lancets Misc. (ACCU-CHEK SOFTCLIX LANCET DEV) KIT USE AS DIRECTED 1 kit 0  . linagliptin (TRADJENTA) 5 MG TABS tablet TAKE 1/2 TABLET BY MOUTH DAILY *NEED OFFICE VISIT* 60 tablet 3  . lisinopril (ZESTRIL) 20 MG tablet Take 1 tablet (20 mg total) by mouth daily. 90 tablet 3  . metFORMIN (GLUCOPHAGE) 500 MG tablet TAKE 2 TABLETS BY MOUTH TWO TIMES DAILY 360 tablet 3  . metoprolol tartrate (LOPRESSOR) 25 MG tablet Take 1 tablet (25 mg total) by mouth daily. 90 tablet 3  . Multiple Vitamins-Minerals (MULTIVITAMIN PO) Take  2 tablets by mouth daily.    Marland Kitchen  OneTouch Delica Lancets 68G MISC Use to check glucose up to twice daily. E11.9 dx code 200 each 3  . oxyCODONE-acetaminophen (PERCOCET/ROXICET) 5-325 MG tablet Take 0.5 tablets by mouth every 4 (four) hours as needed for severe pain (pain).     No current facility-administered medications on file prior to visit.    Review of Systems:  As per HPI- otherwise negative.   Physical Examination: Vitals:   06/02/20 0909  BP: 126/68  Pulse: 61  Resp: 16  Temp: 98.5 F (36.9 C)  SpO2: 98%   Vitals:   06/02/20 0909  Weight: 185 lb (83.9 kg)  Height: 5' 1"  (1.549 m)   Body mass index is 34.96 kg/m. Ideal Body Weight: Weight in (lb) to have BMI = 25: 132  GEN: no acute distress.  Obese, looks well HEENT: Atraumatic, Normocephalic.  Ears and Nose: No external deformity. CV: RRR, No M/G/R. No JVD. No thrill. No extra heart sounds. PULM: CTA B, no wheezes, crackles, rhonchi. No retractions. No resp. distress. No accessory muscle use. EXTR: No c/c/e PSYCH: Normally interactive. Conversant.  Right shoulder-she is able to flex and abduct her right shoulder to about 90 degrees Foot exam:  Callus formation and toe deformity but normal pulses and sensation  Assessment and Plan: Type 2 diabetes mellitus with complication, without long-term current use of insulin ([place]) - Plan: Comprehensive metabolic panel, Hemoglobin A1c  Essential hypertension - Plan: CBC  Hyperlipidemia, unspecified hyperlipidemia type - Plan: Lipid panel  Preoperative clearance  Fatigue, unspecified type - Plan: TSH, VITAMIN D 25 Hydroxy (Vit-D Deficiency, Fractures)  Preoperative evaluation today for planned shoulder replacement.  Await labs, patient has already been cleared by cardiology  I will also touch base with her neurologist about plavix interruption-patient is aware that holding Plavix is necessary, it may come with some degree of increased stroke risk  Blood pressure under  good control Will plan further follow- up pending labs.  This visit occurred during the SARS-CoV-2 public health emergency.  Safety protocols were in place, including screening questions prior to the visit, additional usage of staff PPE, and extensive cleaning of exam room while observing appropriate contact time as indicated for disinfecting solutions.    Signed [name], MD  Received her labs as below. I also got a message from her neurology nurse practitioner, no change in prior recommendations regarding Plavix Results for orders placed or performed in visit on 06/02/20  CBC  Result Value Ref Range   WBC 6.1 4.0 - 10.5 K/uL   RBC 4.91 3.87 - 5.11 Mil/uL   Platelets 289.0 150.0 - 400.0 K/uL   Hemoglobin 14.2 12.0 - 15.0 g/dL   HCT 43.0 36.0 - 46.0 %   MCV 87.6 78.0 - 100.0 fl   MCHC 33.0 30.0 - 36.0 g/dL   RDW 14.8 11.5 - 15.5 %  Comprehensive metabolic panel  Result Value Ref Range   Sodium 141 135 - 145 mEq/L   Potassium 4.2 3.5 - 5.1 mEq/L   Chloride 103 96 - 112 mEq/L   CO2 29 19 - 32 mEq/L   Glucose, Bld 111 (H) 70 - 99 mg/dL   BUN 18 6 - 23 mg/dL   Creatinine, Ser 0.82 0.40 - 1.20 mg/dL   Total Bilirubin 0.7 0.2 - 1.2 mg/dL   Alkaline Phosphatase 74 39 - 117 U/L   AST 15 0 - 37 U/L   ALT 12 0 - 35 U/L   Total Protein 6.8 6.0 - 8.3 g/dL  Albumin 4.0 3.5 - 5.2 g/dL   GFR 72.99 >60.00 mL/min   Calcium 9.6 8.4 - 10.5 mg/dL  Hemoglobin A1c  Result Value Ref Range   Hgb A1c MFr Bld 6.6 (H) 4.6 - 6.5 %  Lipid panel  Result Value Ref Range   Cholesterol 142 0 - 200 mg/dL   Triglycerides 96.0 0.0 - 149.0 mg/dL   HDL 62.10 >39.00 mg/dL   VLDL 19.2 0.0 - 40.0 mg/dL   LDL Cholesterol 61 0 - 99 mg/dL   Total CHOL/HDL Ratio 2    NonHDL 80.25   TSH  Result Value Ref Range   TSH 1.06 0.35 - 4.50 uIU/mL  VITAMIN D 25 Hydroxy (Vit-D Deficiency, Fractures)  Result Value Ref Range   VITD 25.37 (L) 30.00 - 100.00 ng/mL   Message to patient regarding lab  results We will send surgical clearance form

## 2020-06-02 ENCOUNTER — Encounter: Payer: Self-pay | Admitting: Family Medicine

## 2020-06-02 ENCOUNTER — Ambulatory Visit (INDEPENDENT_AMBULATORY_CARE_PROVIDER_SITE_OTHER): Payer: Medicare Other | Admitting: Family Medicine

## 2020-06-02 ENCOUNTER — Telehealth: Payer: Self-pay | Admitting: *Deleted

## 2020-06-02 ENCOUNTER — Other Ambulatory Visit: Payer: Self-pay

## 2020-06-02 VITALS — BP 126/68 | HR 61 | Temp 98.5°F | Resp 16 | Ht 61.0 in | Wt 185.0 lb

## 2020-06-02 DIAGNOSIS — E118 Type 2 diabetes mellitus with unspecified complications: Secondary | ICD-10-CM

## 2020-06-02 DIAGNOSIS — Z01818 Encounter for other preprocedural examination: Secondary | ICD-10-CM | POA: Diagnosis not present

## 2020-06-02 DIAGNOSIS — R5383 Other fatigue: Secondary | ICD-10-CM | POA: Diagnosis not present

## 2020-06-02 DIAGNOSIS — E785 Hyperlipidemia, unspecified: Secondary | ICD-10-CM

## 2020-06-02 DIAGNOSIS — I10 Essential (primary) hypertension: Secondary | ICD-10-CM

## 2020-06-02 LAB — LIPID PANEL
Cholesterol: 142 mg/dL (ref 0–200)
HDL: 62.1 mg/dL (ref >39.00)
LDL Cholesterol: 61 mg/dL (ref 0–99)
NonHDL: 80.25
Total CHOL/HDL Ratio: 2
Triglycerides: 96 mg/dL (ref 0.0–149.0)
VLDL: 19.2 mg/dL (ref 0.0–40.0)

## 2020-06-02 LAB — CBC
HCT: 43 % (ref 36.0–46.0)
Hemoglobin: 14.2 g/dL (ref 12.0–15.0)
MCHC: 33 g/dL (ref 30.0–36.0)
MCV: 87.6 fl (ref 78.0–100.0)
Platelets: 289 10*3/uL (ref 150.0–400.0)
RBC: 4.91 Mil/uL (ref 3.87–5.11)
RDW: 14.8 % (ref 11.5–15.5)
WBC: 6.1 10*3/uL (ref 4.0–10.5)

## 2020-06-02 LAB — COMPREHENSIVE METABOLIC PANEL
ALT: 12 U/L (ref 0–35)
AST: 15 U/L (ref 0–37)
Albumin: 4 g/dL (ref 3.5–5.2)
Alkaline Phosphatase: 74 U/L (ref 39–117)
BUN: 18 mg/dL (ref 6–23)
CO2: 29 mEq/L (ref 19–32)
Calcium: 9.6 mg/dL (ref 8.4–10.5)
Chloride: 103 mEq/L (ref 96–112)
Creatinine, Ser: 0.82 mg/dL (ref 0.40–1.20)
GFR: 72.99 mL/min (ref >60.00)
Glucose, Bld: 111 mg/dL — ABNORMAL HIGH (ref 70–99)
Potassium: 4.2 mEq/L (ref 3.5–5.1)
Sodium: 141 mEq/L (ref 135–145)
Total Bilirubin: 0.7 mg/dL (ref 0.2–1.2)
Total Protein: 6.8 g/dL (ref 6.0–8.3)

## 2020-06-02 LAB — VITAMIN D 25 HYDROXY (VIT D DEFICIENCY, FRACTURES): VITD: 25.37 ng/mL — ABNORMAL LOW (ref 30.00–100.00)

## 2020-06-02 LAB — HEMOGLOBIN A1C: Hgb A1c MFr Bld: 6.6 % — ABNORMAL HIGH (ref 4.6–6.5)

## 2020-06-02 LAB — TSH: TSH: 1.06 u[IU]/mL (ref 0.35–4.50)

## 2020-06-02 NOTE — Telephone Encounter (Signed)
   Telephone encounter was:  Successful.  06/02/2020 Name: Sherry [name] MRN: [id number] DOB: 1950-05-01  [name] is a 70 y.o. year old female who is a primary care patient of Copland, Gay Filler, MD . The community resource team was consulted for assistance with Home Modifications  Care guide performed the following interventions: Patient provided with information about care guide support team and interviewed to confirm resource needs Discussed resources to assist with railing replacement  Follow up call placed to community resources to determine status of patients referral.  Follow Up Plan:  Care guide will follow up with patient by phone over the next day  .[place], Care Management  [phone] [address] , [place] [address] Email : [email or web address]

## 2020-06-03 ENCOUNTER — Telehealth: Payer: Self-pay

## 2020-06-03 NOTE — Telephone Encounter (Signed)
Surgical clearance form faxed to ortho. Copy sent to scan. Original copy in "faxed" folder until appears in media

## 2020-06-04 ENCOUNTER — Other Ambulatory Visit: Payer: Self-pay

## 2020-06-04 ENCOUNTER — Ambulatory Visit
Admission: RE | Admit: 2020-06-04 | Discharge: 2020-06-04 | Disposition: A | Discharge status: Home / Self Care | Payer: Medicare Other | Source: Ambulatory Visit | Attending: Family Medicine | Admitting: Family Medicine

## 2020-06-04 DIAGNOSIS — Z1231 Encounter for screening mammogram for malignant neoplasm of breast: Secondary | ICD-10-CM | POA: Diagnosis not present

## 2020-06-11 ENCOUNTER — Telehealth: Payer: Self-pay | Admitting: *Deleted

## 2020-06-11 NOTE — Telephone Encounter (Signed)
   Telephone encounter was:  Successful.  06/11/2020 Name: Sherry [name] MRN: [id number] DOB: 1950/10/10  Steva Ready Sherry [name] is a 70 y.o. year old female who is a primary care patient of Copland, Gay Filler, MD . The community resource team was consulted for assistance with home modifications and railing fixed   Care guide performed the following interventions: Follow up call placed to community resources to determine status of patients referral.income guidelines for any of the places i sent her too but she was grateful to have these resourses will get family member to fix rail  Follow Up Plan:  No further follow up planned at this time. The patient has been provided with needed resources.Patient has family member to complete project   [place], Care Management  [phone] [address] , [place] [address] Email : [email or web address]

## 2020-06-26 ENCOUNTER — Ambulatory Visit (INDEPENDENT_AMBULATORY_CARE_PROVIDER_SITE_OTHER): Payer: Medicare Other | Admitting: Pharmacist

## 2020-06-26 DIAGNOSIS — E118 Type 2 diabetes mellitus with unspecified complications: Secondary | ICD-10-CM | POA: Diagnosis not present

## 2020-06-26 DIAGNOSIS — I10 Essential (primary) hypertension: Secondary | ICD-10-CM

## 2020-06-26 DIAGNOSIS — F320 Major depressive disorder, single episode, mild: Secondary | ICD-10-CM

## 2020-06-26 DIAGNOSIS — I679 Cerebrovascular disease, unspecified: Secondary | ICD-10-CM

## 2020-06-26 DIAGNOSIS — R609 Edema, unspecified: Secondary | ICD-10-CM

## 2020-06-26 DIAGNOSIS — J45909 Unspecified asthma, uncomplicated: Secondary | ICD-10-CM

## 2020-06-27 NOTE — Patient Instructions (Signed)
Visit Information  PATIENT GOALS: Goals Addressed            This Visit's Progress   . Chronic Care Management Pharmacy Care Plan   On track    CARE PLAN ENTRY (see longitudinal plan of care for additional care plan information)  Current Barriers:  . Chronic Disease Management support, education, and care coordination needs related to Asthma, Diabetes, Hypertension, Hx of Heart Failure, Hyperlipidemia/Hx of TIA, Depression, Pain   Hypertension BP Readings from Last 3 Encounters:  06/02/20 126/68  05/29/20 (!) 142/84  05/19/20 120/72   . Pharmacist Clinical Goal(s): o Over the next 90 days, patient will work with PharmD and providers to maintain BP goal <140/90 . Current regimen:   Lisinopril 20mg  daily  Metoprolol 25mg  - take 0.5 tab = 12.5mg  twice daily . Interventions: o Requested patient to check blood pressure 3 to 4 times per week and record . Patient self care activities - Over the next 90 days, patient will: o Check blood pressure 3 to 4 times per week, document, and provide at future appointments o Ensure daily salt intake < 2300 mg/day o Report symptoms of low blood pressure like dizziness or if blood pressure is less than 324 on top (systolic BP) or less than 60 on bottom (diastolic BP) o Increase water intake - try to have about 2 liters per day.   Hyperlipidemia / history of TIA Lipid Panel     Component Value Date/Time   CHOL 142 06/02/2020 0933   TRIG 96.0 06/02/2020 0933   HDL 62.10 06/02/2020 0933   CHOLHDL 2 06/02/2020 0933   VLDL 19.2 06/02/2020 0933   LDLCALC 61 06/02/2020 0933   . Pharmacist Clinical Goal(s): o Over the next 90 days, patient will work with PharmD and providers to maintain LDL goal < 70 . Current regimen:  o Diet and exercise management for cholesterol o Clopidogrel 75mg  daily   . Interventions: o Consider initiation of rosuvastatin 10mg  3 times per week to help reduce risk of future TIA, stroke, or heart attack . Patient self  care activities - Over the next 90 days, patient will: o Maintain LDL <70 o Remember to hold clopidogrel 3 to 5 days prior to surgery  Diabetes Lab Results  Component Value Date/Time   HGBA1C 6.6 (H) 06/02/2020 09:33 AM   HGBA1C 6.2 04/16/2019 03:56 PM   . Pharmacist Clinical Goal(s): o Over the next 90 days, patient will work with PharmD and providers to maintain A1c goal <7% . Current regimen:   Tradjenta 5mg  1/2 tab daily  Jardiance 10mg  1/2 tab daily   Metformin 500mg  - take 2 tablets = 1000mg  twice daily . Interventions: o Reviewed identification and treatment for hypoglycemia (low blood sugar) . Patient self care activities - Over the next 90 days, patient will: o Maintain diabetes medication regimen o Contact office if experience increase in low blood sugar readings (<80)  Depression . Pharmacist Clinical Goal(s) o Over the next 90 days, patient will work with PharmD and providers to address depression symptoms and restart medication if needed.  . Current regimen:   none . Interventions: o Patient is considering restarting one of her previous medications for depression. Recommend contact office prior to doing this (would recommend trial of sertraline 25mg  daily since she felt that she had tardive dyskinesia symptoms last time which were likely related to quetiapine)  . Patient self care activities - Over the next 90 days, patient will: o Call office to discuss with  Dr Lorelei Pont if you would like to restart sertraline 25mg   Low Serum Vitamin D: . Pharmacist Clinical Goal(s) o Over the next 90 days, patient will work with PharmD and providers to increase serum vitamin D to normal range (30 to 100) . Current regimen:   Vitamin D3 2000 units daily  Interventions:  Coordinated with pharmacy to have vitamin D3 added to her pill packages.  . Patient self care activities - Over the next 90 days, patient will: - Continue to take vitamin D3 2000 units from bottle until new  packaging is delivered.   Asthma:  . Pharmacist Clinical Goal(s) o Over the next 90 days, patient will work with PharmD and providers to improve adherence to maintenance inhaler . Current regimen:   Flovent 217mcg inhaler - inhale 1 puff twice a day (may increase to 2 puff twice a day if needed)   Albuterol Inhaler - inhale 2 puffs every 6 hours as needed.   Interventions:  Discussed difference between maintenance / Flovent inhaler and rescue / albuterol inhaler  Encouraged patient to use Flovent every day during months she is exposed to allergens.  . Patient self care activities - Over the next 90 days, patient will: - Use Flovent  / maintenance inhaler every day   Medication management . Pharmacist Clinical Goal(s): o Over the next 90 days, patient will work with PharmD and providers to achieve optimal medication adherence . [facility] . Interventions o Comprehensive medication review performed. o Utilize [facility] pharmacy for medication synchronization, packaging and delivery . Patient self care activities - Over the next 90 days, patient will: o Focus on medication adherence by filling medications appropriately  o Take medications as prescribed o Report any questions or concerns to PharmD and/or provider(s)  Please see past updates related to this goal by clicking on the "Past Updates" button in the selected goal         Patient verbalizes understanding of instructions provided today and agrees to view in [place].   Telephone follow up appointment with care management team member scheduled for:3 months  [name], PharmD Clinical Pharmacist [facility]

## 2020-06-27 NOTE — Chronic Care Management (AMB) (Signed)
Chronic Care Management Pharmacy Note  06/27/2020 Name:  Sherry [name] MRN:  [id number] DOB:  11/26/1950  Subjective: Sherry [name] is an 70 y.o. year old female who is a primary patient of Copland, Gay Filler, MD.  The CCM team was consulted for assistance with disease management and care coordination needs.    Engaged with patient by telephone for follow up visit in response to provider referral for pharmacy case management and/or care coordination services.   Consent to Services:  The patient was given information about Chronic Care Management services, agreed to services, and gave verbal consent prior to initiation of services.  Please see initial visit note for detailed documentation.   Patient Care Team: Copland, Gay Filler, MD as PCP - General (Family Medicine) Debara Pickett Nadean Corwin, MD as PCP - Cardiology (Cardiology) [name], PharmD (Pharmacist)  Recent office visits: 06/03/2020 - PCP (Dr Lorelei Pont) - Pre-Op Evaluation for right reverse total shoulder repair; Labs checked; no medication changes.   Recent consult visits: 05/19/20 Cardiology Sande Rives E, PA-C. For pre-op evaluation. Per note: Hold Plavix for 5 days STOPPED Methocarbamol 500 mg 3 times daily  12/10/2019 - Neurology (Dr Darden Dates) surgical clearance. Cleared for surgery; Recommedned holding Plavix 3 to 5 days prior to surgery and resatart immedicately after surgery once hemodydnamically stable.   Hospital visits: None in previous 6 months  Objective:  Lab Results  Component Value Date   CREATININE 0.82 06/02/2020   CREATININE 0.81 09/04/2019   CREATININE 0.66 04/16/2019    Lab Results  Component Value Date   HGBA1C 6.6 (H) 06/02/2020   Last diabetic Eye exam: No results found for: HMDIABEYEEXA  Last diabetic Foot exam: No results found for: HMDIABFOOTEX      Component Value Date/Time   CHOL 142 06/02/2020 0933   TRIG 96.0 06/02/2020 0933   HDL 62.10 06/02/2020 0933   CHOLHDL 2  06/02/2020 0933   VLDL 19.2 06/02/2020 0933   LDLCALC 61 06/02/2020 0933    Hepatic Function Latest Ref Rng & Units 06/02/2020 09/04/2019 04/16/2019  Total Protein 6.0 - 8.3 g/dL 6.8 6.6 6.4  Albumin 3.5 - 5.2 g/dL 4.0 3.6 4.1  AST 0 - 37 U/L 15 19 12   ALT 0 - 35 U/L 12 14 11   Alk Phosphatase 39 - 117 U/L 74 56 77  Total Bilirubin 0.2 - 1.2 mg/dL 0.7 0.5 0.6    Lab Results  Component Value Date/Time   TSH 1.06 06/02/2020 09:33 AM   TSH 1.320 05/16/2019 02:14 PM   FREET4 0.63 12/30/2015 02:38 PM   FREET4 0.74 01/13/2015 09:54 AM    CBC Latest Ref Rng & Units 06/02/2020 09/04/2019 04/16/2019  WBC 4.0 - 10.5 K/uL 6.1 5.8 5.9  Hemoglobin 12.0 - 15.0 g/dL 14.2 13.4 12.6  Hematocrit 36.0 - 46.0 % 43.0 42.8 39.3  Platelets 150.0 - 400.0 K/uL 289.0 196 251.0    Lab Results  Component Value Date/Time   VD25OH 25.37 (L) 06/02/2020 09:33 AM    Clinical ASCVD: Yes  The 10-year ASCVD risk score Mikey Bussing DC Jr., et al., 2013) is: 18.4%   Values used to calculate the score:     Age: 89 years     Sex: Female     Is Non-Hispanic African American: Yes     Diabetic: Yes     Tobacco smoker: No     Systolic Blood Pressure: 497 mmHg     Is BP treated: Yes     HDL Cholesterol: 62.1  mg/dL     Total Cholesterol: 142 mg/dL    Other: (CHADS2VASc if Afib, PHQ9 if depression, MMRC or CAT for COPD, ACT, DEXA)  Social History   Tobacco Use  Smoking Status Never Smoker  Smokeless Tobacco Never Used   BP Readings from Last 3 Encounters:  06/02/20 126/68  05/29/20 (!) 142/84  05/19/20 120/72   Pulse Readings from Last 3 Encounters:  06/02/20 61  05/29/20 76  05/19/20 (!) 58   Wt Readings from Last 3 Encounters:  06/02/20 185 lb (83.9 kg)  05/29/20 189 lb 3.2 oz (85.8 kg)  05/19/20 186 lb (84.4 kg)    Assessment: Review of patient past medical history, allergies, medications, health status, including review of consultants reports, laboratory and other test data, was performed as part of  comprehensive evaluation and provision of chronic care management services.   SDOH:  (Social Determinants of Health) assessments and interventions performed:  SDOH Interventions   Flowsheet Row Most Recent Value  SDOH Interventions   Financial Strain Interventions Intervention Not Indicated  Physical Activity Interventions Other (Comments)  [Pt is increasing daily activities.]  Stress Interventions Intervention Not Indicated  Depression Interventions/Treatment  --  [Patient considering restarting sertraline, ]      CCM Care Plan  Allergies  Allergen Reactions  . Influenza Vaccines Anaphylaxis  . Influenza Virus Vaccine Anaphylaxis  . Demerol Other (See Comments)    SEIZURE  . Nsaids Other (See Comments)    NAPROXEN, IBUPROFEN, SKELAXIN---  CAUSES PALPITATIONS  . Prednisone Other (See Comments)    Caused her glucose to go up to 500 and went to ED  . Tramadol Other (See Comments)    Contraindicated with Victoza   . Codeine Itching  . Mango Flavor [Flavoring Agent] Itching    Medications Reviewed Today    Reviewed by [name], PharmD (Pharmacist) on 06/26/20 at 1203  Med List Status: <None>  Medication Order Taking? Sig Documenting Provider Last Dose Status Informant  albuterol (VENTOLIN HFA) 108 (90 Base) MCG/ACT inhaler [id number] Yes Inhale 2 puffs into the lungs every 6 (six) hours as needed for wheezing or shortness of breath. Copland, Gay Filler, MD Taking Active   amoxicillin (AMOXIL) 500 MG capsule [id number] No TAKE FOUR CAPSULES BY MOUTH ONCE FOR ONE DOSE prior TO dental work  Patient not taking: Reported on 06/26/2020   [provider] Not Taking Active   blood glucose meter kit and supplies KIT [id number] Yes Dispense based on patient and insurance preference. Use up to four times daily as directed. (FOR ICD-9 250.00, 250.01). Pt uses once a day Copland, Gay Filler, MD Taking Active   Blood Glucose Monitoring Suppl (ONE TOUCH ULTRA 2) w/Device KIT [id number]  Yes Use to check glucose up to twice daily. E11.9 dx code Copland, Gay Filler, MD Taking Active   cetirizine (ZYRTEC) 10 MG tablet [id number] Yes Take 1 tablet (10 mg total) by mouth daily. Copland, Gay Filler, MD Taking Active Self  Cholecalciferol (VITAMIN D3) 50 MCG (2000 UT) CAPS [id number] Yes Take 1 capsule by mouth daily. In morning [provider] Taking Active   clopidogrel (PLAVIX) 75 MG tablet [id number] Yes Take 1 tablet (75 mg total) by mouth daily. Copland, Gay Filler, MD Taking Active   empagliflozin (JARDIANCE) 10 MG TABS tablet [id number] Yes Take 1 tablet (10 mg total) by mouth daily. Take 0.5 tablets (5 mg total) daily. Copland, Gay Filler, MD Taking Active   famotidine (PEPCID) 20 MG tablet [id number] Yes Take 20 mg by  mouth daily.  [provider] Taking Active Self  fluticasone (FLOVENT HFA) 220 MCG/ACT inhaler [id number] No Inhale 1 puff into the lungs 2 (two) times daily. May increase to 2 puffs if needed  Patient not taking: Reported on 06/26/2020   Copland, Gay Filler, MD Not Taking Active            Med Note Iona Beard, [facility] A   Mon May 19, 2020  9:16 AM)    furosemide (LASIX) 20 MG tablet [id number] Yes Take 1 tablet (20 mg total) by mouth daily. Copland, Gay Filler, MD Taking Active   gabapentin (NEURONTIN) 300 MG capsule [id number] Yes TAKE 2 CAPSULES BY MOUTH TWO TIMES DAILY Copland, Gay Filler, MD Taking Active   glucose blood (ACCU-CHEK AVIVA PLUS) test strip [id number] Yes Use as instructed Copland, Gay Filler, MD Taking Active   glucose blood (ONETOUCH ULTRA) test strip [id number] Yes Use to check glucose up to twice daily. E11.9 dx code Copland, Gay Filler, MD Taking Active   ipratropium (ATROVENT) 0.03 % nasal spray [id number] Yes Place 2 sprays into the nose 4 (four) times daily. Copland, Gay Filler, MD Taking Active   Lancets Misc. (ACCU-CHEK SOFTCLIX LANCET DEV) KIT [id number] Yes USE AS DIRECTED Renato Shin, MD Taking Active Self  linagliptin (TRADJENTA) 5 MG  TABS tablet [id number] Yes TAKE 1/2 TABLET BY MOUTH DAILY *NEED OFFICE VISIT* Copland, Gay Filler, MD Taking Active   lisinopril (ZESTRIL) 20 MG tablet [id number] Yes Take 1 tablet (20 mg total) by mouth daily. Copland, Gay Filler, MD Taking Active   metFORMIN (GLUCOPHAGE) 500 MG tablet [id number] Yes TAKE 2 TABLETS BY MOUTH TWO TIMES DAILY Copland, Gay Filler, MD Taking Active   metoprolol tartrate (LOPRESSOR) 25 MG tablet [id number] Yes Take 1 tablet (25 mg total) by mouth daily.  Patient taking differently: Take 12.5 mg by mouth daily.   Copland, Gay Filler, MD Taking Active   Multiple Vitamins-Minerals (MULTIVITAMIN PO) [id number] Yes Take 2 tablets by mouth daily. [provider] Taking Active Self  OneTouch Delica Lancets 92E MISC [id number] Yes Use to check glucose up to twice daily. E11.9 dx code Copland, Gay Filler, MD Taking Active   oxyCODONE-acetaminophen (PERCOCET/ROXICET) 5-325 MG tablet [id number] No Take 0.5 tablets by mouth every 4 (four) hours as needed for severe pain (pain).  Patient not taking: Reported on 06/26/2020   [provider] Not Taking Active           Patient Active Problem List   Diagnosis Date Noted  . Traumatic hematoma of left lower leg 09/26/2018  . S/P total knee arthroplasty, left 03/29/2017  . Spondylosis without myelopathy or radiculopathy, cervical region 03/29/2017  . Bilateral carpal tunnel syndrome 03/29/2017  . Acute asthma exacerbation 08/19/2016  . Cervicalgia 03/23/2016  . Vertigo 04/14/2015  . Anxiety state 01/22/2015  . HLD (hyperlipidemia) 01/22/2015  . Type 2 diabetes mellitus with circulatory disorder ([place]) 01/22/2015  . Intracranial vascular stenosis 01/22/2015  . Aneurysm ([place])   . Headache   . Essential hypertension 11/12/2014  . Dependent edema 11/12/2014  . Peripheral neuropathy 11/12/2014  . Depression 11/12/2014  . Facial droop   . TIA (transient ischemic attack) 11/11/2014  . Hyperthyroidism 08/06/2014  .  Meniere's disease 06/07/2014  . Goiter 06/13/2013  . Obesity, unspecified 06/13/2013    Immunization History  Administered Date(s) Administered  . PPD Test 06/29/2013, 02/18/2015, 04/25/2018  . Pneumococcal Conjugate-13 12/06/2016  . Pneumococcal Polysaccharide-23 11/21/2009, 02/15/2018    Conditions to be addressed/monitored: CHF, CAD, HTN,  HLD, DMII, Depression and asthma; neuropathy; low serum vitamin D  Care Plan : General Pharmacy (Adult)  Updates made by [name], PHARMD since 06/27/2020 12:00 AM    Problem: CHL AMB "PATIENT-SPECIFIC PROBLEM"   Priority: High  Onset Date: 06/26/2020  Note:   Current Barriers:  . Chronic Disease Management support, education, and care coordination needs related to Asthma, Diabetes, Hypertension, Heart Failure, Hyperlipidemia/Hx of TIA, Depression, Pain  Pharmacist Clinical Goal(s):  Marland Kitchen Over the next 180 days, patient will maintain control of type 2 DM and HTN as evidenced by A1c < 7.0 and BP < 140/90  . adhere to prescribed medication regimen as evidenced by fill history  through collaboration with PharmD and provider.   Interventions: . 1:1 collaboration with Copland, Gay Filler, MD regarding development and update of comprehensive plan of care as evidenced by provider attestation and co-signature . Inter-disciplinary care team collaboration (see longitudinal plan of care) . Comprehensive medication review performed; medication list updated in electronic medical record  Hypertension / CHF BP Readings from Last 3 Encounters:  06/02/20 126/68  05/29/20 (!) 142/84  05/19/20 120/72 .  Variable but mostly controlled per recent office BP reading. BP goal <140/90 . Patient checks BP 1 or 2 times per week - recent readings 108/70; 119/80 . HR usually 58 to 72 . Reports having some dizziness when standing but she is aware to rise slowly . Metoprolol dose was lowered about 6 months ago to reduce dizziness . Denis SOB or edema - states "my  legs are toothpicks" . Weight almost daily - repots has been stable . Current regimen:   Lisinopril 73m daily  Metoprolol tartrate 243m- take 0.5 tab = 12.3m26mwice daily  Furosemide 41m60mth breakfast . Interventions: o If low BP dizziness continues could consider change to metoprolol succinate 23mg33m/2 once a day  o Requested patient to check blood pressure 3 to 4 times per week and record o Increase water intake - try to have about 2 liters per day.   Hyperlipidemia / history of TIA . LDL goal is <70 - patient is at goal with pharmacotherapy . No history of statin intolerance . Denies s/s of bleeding . She will have shoulder surgery but has not been scheduled yet.  . Current regimen:  o Diet and exercise management for cholesterol o Clopidogrel 73mg 3my   . Interventions: o Consider initiation of rosuvastatin 10mg 333mes per week to help reduce risk of future TIA, stroke, or heart attack o Discussed holding clopidogrel 3 to 5 days prior to surgery  Diabetes . Controlled; last A1c at goal of < 7.0% . Checked BG at home 2 to 3 times per week.  o BG yesterday was 119 o Ranges from 90-190 . Reports she rarely has hypoglycemia but had 1 episode this month while mowing when BG dropped to 90's and she felt shaky. Drank OK and BG improved.  . Current regimen:   Tradjenta 3mg 1/273mb daily  Jardiance 10mg 1/229m daily   Metformin 500mg - ta73m tablets = 1000mg twice48mly . Interventions: o Reviewed identification and treatment for hypoglycemia (low blood sugar) o Given that she has CHF, would like to have her on full 10mg of Jar88mce or 23mg however22mh current low HR and BP, would be cautious of pushing dose. Will continue to monitor.  o Maintain diabetes medication regimen o Contact office if experience increase in low blood sugar readings (<80)  Depression . Patient's  grandson was murders / shot in January 2022. She also lost a son about 20 years ago to gun  violence.  .  She has seen grief counselor in past but is not interested in going back.  . She states she does belong to a [place] and this has been helpful. . She has taken sertraline 13m daily and quetiapine 263mdaily in past . States she stopped because she did not feel she needed and she was having tardive dyskinesia symptoms . Current regimen:   none . Interventions: o Patient is considering restarting one of her previous medications for depression. Recommend contact office prior to doing this (would recommend trial of sertraline 2574maily since she felt that she had tardive dyskinesia symptoms last time which were most likely caused by quetiapine)    Low Serum Vitamin D: . Last serum vitamin D was 25.37 (06/02/2020) . Current regimen:   Vitamin D3 2000 units daily - started after labs from 06/02/2020  Interventions:  Coordinated with pharmacy to have vitamin D3 added to her pill packages.  - Patient advised to continue to take vitamin D3 2000 units from bottle until new packaging is delivered.   Asthma:  . Rarely uses albuterol inhaler . Only using Flovent as needed.  . No recent exacerbations . Current regimen:   Flovent 220m43mnhaler - inhale 1 puff twice a day (may increase to 2 puff twice a day if needed)   Albuterol Inhaler - inhale 2 puffs every 6 hours as needed.   Interventions:  Discussed difference between maintenance / Flovent inhaler and rescue / albuterol inhaler  Encouraged patient to use Flovent every day at least during months she is exposed to allergens.    Medication management . Current pharmacy: [facility] . Interventions o Comprehensive medication review performed. o Utilize [facility] pharmacy for medication synchronization, packaging and delivery  Patient Goals/Self-Care Activities . Over the next 90 days, patient will:  take medications as prescribed, check glucose 2 to 3 times per week, document, and provide at future  appointments, check blood pressure 2 to 3 times per week, document, and provide at future appointments, weigh daily, and contact provider if weight gain of 3lbs in 24 hours or 5lb in 1 week, and increase water intake  Follow Up Plan: Telephone follow up appointment with care management team member scheduled for:  3 months          Medication Assistance: None required.  Patient affirms current coverage meets needs.  Patient's preferred pharmacy is:  [facility] [place] [place]: [phone]: [phone] pill box? Yes - med packaged by [facility] Pt endorses 99% compliance  Follow Up:  Patient agrees to Care Plan and Follow-up.  Plan: Monitor BP - would like to increase Jardiance to 10 or 25mg107mly could lower  BP and HR more.  Coordinated with pharmacy to get vitamin D supplement added to packaging.    Telephone follow up appointment with care management team member scheduled for:  3 months  TammyCherre RobinsrmD Clinical Pharmacist [facility]

## 2020-07-23 NOTE — Patient Instructions (Addendum)
DUE TO COVID-19 ONLY ONE VISITOR IS ALLOWED TO COME WITH YOU AND STAY IN THE WAITING ROOM ONLY DURING PRE OP AND PROCEDURE.    **NO VISITORS ARE ALLOWED IN THE SHORT STAY AREA OR RECOVERY ROOM!!**   IF YOU WILL BE ADMITTED INTO THE HOSPITAL YOU ARE ALLOWED ONLY TWO SUPPORT PEOPLE DURING VISITATION HOURS ONLY (10AM -8PM)    The support person(s) may change daily. The support person(s) must pass our screening, gel in and out, and wear a mask at all times, including in the patient's room. Patients must also wear a mask when staff or their support person are in the room.  No visitors under the age of 1. Any visitor under the age of 20 must be accompanied by an adult.      COVID SWAB TESTING MUST BE COMPLETED ON:  6/20 at 9:00 am    [address] [place], [place] [address]   You are not required to quarantine, however you are required to wear a well-fitted mask when you are out and around people not in your household.  Hand Hygiene often Do NOT share personal items Notify your provider if you are in close contact with someone who has COVID or you develop fever 100.4 or greater, new onset of sneezing, cough, sore throat, shortness of breath or body aches.  [place] [address], [address], must go inside of the hospital, NOT A DRIVE THRU!  (Must self quarantine after testing. Follow instructions on handout.)       Your procedure is scheduled on: 07/30/20   Report to [facility] Main  Entrance   Report to Short Stay at 5:15 AM   [facility])       Call this number if you have problems the morning of surgery [phone]   Do not eat food :After Midnight.   May have liquids until 4:30 am    day of surgery  CLEAR LIQUID DIET  Foods Allowed                                                                     Foods Excluded  Water, Black Coffee and tea, regular and decaf                             liquids that you  cannot  Plain Jell-O in any flavor  (No red)                                           see through such as: Fruit ices (not with fruit pulp)                                     milk, soups, orange juice              Iced Popsicles (No red)  All solid food                                   Apple juices Sports drinks like Gatorade (No red) Lightly seasoned clear broth or consume(fat free) Sugar, honey syrup            The day of surgery:  Drink ONE (1)  G2 by 4:30 am the morning of surgery. Drink in one sitting. Do not sip.  This drink was given to you during your hospital  pre-op appointment visit. Nothing else to drink after completing the  Pre-Surgery Clear Ensure or G2.          If you have questions, please contact your surgeon's office.     Oral Hygiene is also important to reduce your risk of infection.                                    Remember - BRUSH YOUR TEETH THE MORNING OF SURGERY WITH YOUR REGULAR TOOTHPASTE   Do NOT smoke after Midnight   Take these medicines the morning of surgery with A SIP OF WATER: Gabapentin, Metoprolol, pepcid,               Use your inhaler and bring it with you                                You may not have any metal on your body including hair pins, jewelry, and body piercing             Do not wear make-up, lotions, powders, perfumes/cologne, or deodorant  Do not wear nail polish including gel and S&S, artificial/acrylic nails, or any other type of covering on natural nails including finger and toenails.  If you have artificial nails, gel coating, etc. that needs to be removed by a nail salon please have this removed prior to surgery or surgery may need to be canceled/ delayed if the surgeon/ anesthesia feels like they are unable to be safely monitored.   Do not shave  48 hours prior to surgery.                  Do not bring valuables to the hospital. [place].   Contacts, dentures or bridgework may not be worn into surgery.   Bring small overnight bag day of surgery.    Patients discharged the day of surgery will not be allowed to drive home.   Special Instructions: Bring a copy of your healthcare power of attorney and living will documents the day of surgery if you haven't scanned them in before.              Please read over the following fact sheets you were given:  IF YOU HAVE QUESTIONS ABOUT YOUR PRE OP INSTRUCTIONS PLEASE CALL [facility]- Preparing for Total Shoulder Arthroplasty    Before surgery, you can play an important role. Because skin is not sterile, your skin needs to be as free of germs as possible. You can reduce the number of germs on your skin by using the following products. Benzoyl Peroxide Gel Reduces the number of  germs present on the skin Applied twice a day to shoulder area starting two days before surgery    ==================================================================  Please follow these instructions carefully:  BENZOYL PEROXIDE 5% GEL  Please do not use if you have an allergy to benzoyl peroxide.   If your skin becomes reddened/irritated stop using the benzoyl peroxide.  Starting two days before surgery, apply as follows: Apply benzoyl peroxide in the morning and at night. Apply after taking a shower. If you are not taking a shower clean entire shoulder front, back, and side along with the armpit with a clean wet washcloth.  Place a quarter-sized dollop on your shoulder and rub in thoroughly, making sure to cover the front, back, and side of your shoulder, along with the armpit.   2 days before ____ AM   ____ PM              1 day before ____ AM   ____ PM                         Do this twice a day for two days.  (Last application is the night before surgery, AFTER using the CHG soap as described below).  Do NOT apply benzoyl peroxide gel on the day of surgery.   [facility] - Preparing for Surgery Before surgery, you can play an important role.  Because skin is not sterile, your skin needs to be as free of germs as possible.  You can reduce the number of germs on your skin by washing with CHG (chlorahexidine gluconate) soap before surgery.  CHG is an antiseptic cleaner which kills germs and bonds with the skin to continue killing germs even after washing. Please DO NOT use if you have an allergy to CHG or antibacterial soaps.  If your skin becomes reddened/irritated stop using the CHG and inform your nurse when you arrive at Short Stay.  You may shave your face/neck.  Please follow these instructions carefully:  1.  Shower with CHG Soap the night before surgery and the  morning of Surgery.  2.  If you choose to wash your hair, wash your hair first as usual with your  normal  shampoo.  3.  After you shampoo, rinse your hair and body thoroughly to remove the  shampoo.                                        4.  Use CHG as you would any other liquid soap.  You can apply chg directly  to the skin and wash                       Gently with a scrungie or clean washcloth.  5.  Apply the CHG Soap to your body ONLY FROM THE NECK DOWN.   Do not use on face/ open                           Wound or open sores. Avoid contact with eyes, ears mouth and genitals (private parts).                       Wash face,  Genitals (private parts) with your normal soap.             6.  Wash thoroughly, paying special attention to the area where your surgery  will be performed.  7.  Thoroughly rinse your body with warm water from the neck down.  8.  DO NOT shower/wash with your normal soap after using and rinsing off  the CHG Soap.             9.  Pat yourself dry with a clean towel.            10.  Wear clean pajamas.            11.  Place clean sheets on your bed the night of your first shower and do not  sleep with pets. Day of Surgery : Do not apply any lotions/deodorants the  morning of surgery.  Please wear clean clothes to the hospital/surgery center.  FAILURE TO FOLLOW THESE INSTRUCTIONS MAY RESULT IN THE CANCELLATION OF YOUR SURGERY PATIENT SIGNATURE_________________________________  NURSE SIGNATURE__________________________________  ________________________________________________________________________   Adam Phenix  An incentive spirometer is a tool that can help keep your lungs clear and active. This tool measures how well you are filling your lungs with each breath. Taking long deep breaths may help reverse or decrease the chance of developing breathing (pulmonary) problems (especially infection) following: A long period of time when you are unable to move or be active. BEFORE THE PROCEDURE  If the spirometer includes an indicator to show your best effort, your nurse or respiratory therapist will set it to a desired goal. If possible, sit up straight or lean slightly forward. Try not to slouch. Hold the incentive spirometer in an upright position. INSTRUCTIONS FOR USE  Sit on the edge of your bed if possible, or sit up as far as you can in bed or on a chair. Hold the incentive spirometer in an upright position. Breathe out normally. Place the mouthpiece in your mouth and seal your lips tightly around it. Breathe in slowly and as deeply as possible, raising the piston or the ball toward the top of the column. Hold your breath for 3-5 seconds or for as long as possible. Allow the piston or ball to fall to the bottom of the column. Remove the mouthpiece from your mouth and breathe out normally. Rest for a few seconds and repeat Steps 1 through 7 at least 10 times every 1-2 hours when you are awake. Take your time and take a few normal breaths between deep breaths. The spirometer may include an indicator to show your best effort. Use the indicator as a goal to work toward during each repetition. After each set of 10 deep breaths, practice  coughing to be sure your lungs are clear. If you have an incision (the cut made at the time of surgery), support your incision when coughing by placing a pillow or rolled up towels firmly against it. Once you are able to get out of bed, walk around indoors and cough well. You may stop using the incentive spirometer when instructed by your caregiver.  RISKS AND COMPLICATIONS Take your time so you do not get dizzy or light-headed. If you are in pain, you may need to take or ask for pain medication before doing incentive spirometry. It is harder to take a deep breath if you are having pain. AFTER USE Rest and breathe slowly and easily. It can be helpful to keep track of a log of your progress. Your caregiver can provide you with a simple table to help with this. If you are using the spirometer at home,  follow these instructions: [place] IF:  You are having difficultly using the spirometer. You have trouble using the spirometer as often as instructed. Your pain medication is not giving enough relief while using the spirometer. You develop fever of 100.5 F (38.1 C) or higher. SEEK IMMEDIATE MEDICAL CARE IF:  You cough up bloody sputum that had not been present before. You develop fever of 102 F (38.9 C) or greater. You develop worsening pain at or near the incision site. MAKE SURE YOU:  Understand these instructions. Will watch your condition. Will get help right away if you are not doing well or get worse. Document Released: 06/07/2006 Document Revised: 04/19/2011 Document Reviewed: 08/08/2006 [facility] Patient Information 2014 [place], [place].   ________________________________________________________________________

## 2020-07-24 ENCOUNTER — Other Ambulatory Visit: Payer: Self-pay

## 2020-07-24 ENCOUNTER — Encounter (HOSPITAL_COMMUNITY)
Admission: RE | Admit: 2020-07-24 | Discharge: 2020-07-24 | Disposition: A | Discharge status: Home / Self Care | Payer: Medicare Other | Source: Ambulatory Visit | Attending: Orthopaedic Surgery | Admitting: Orthopaedic Surgery

## 2020-07-24 ENCOUNTER — Encounter (HOSPITAL_COMMUNITY): Payer: Self-pay

## 2020-07-24 DIAGNOSIS — Z01812 Encounter for preprocedural laboratory examination: Secondary | ICD-10-CM | POA: Diagnosis not present

## 2020-07-24 LAB — CBC
HCT: 43.3 % (ref 36.0–46.0)
Hemoglobin: 13.4 g/dL (ref 12.0–15.0)
MCH: 28.6 pg (ref 26.0–34.0)
MCHC: 30.9 g/dL (ref 30.0–36.0)
MCV: 92.3 fL (ref 80.0–100.0)
Platelets: 280 10*3/uL (ref 150–400)
RBC: 4.69 MIL/uL (ref 3.87–5.11)
RDW: 14.6 % (ref 11.5–15.5)
WBC: 6.7 10*3/uL (ref 4.0–10.5)
nRBC: 0 % (ref 0.0–0.2)

## 2020-07-24 LAB — BASIC METABOLIC PANEL
Anion gap: 9 (ref 5–15)
BUN: 21 mg/dL (ref 8–23)
CO2: 26 mmol/L (ref 22–32)
Calcium: 9 mg/dL (ref 8.9–10.3)
Chloride: 104 mmol/L (ref 98–111)
Creatinine, Ser: 0.71 mg/dL (ref 0.44–1.00)
GFR, Estimated: >60 mL/min (ref >60)
Glucose, Bld: 79 mg/dL (ref 70–99)
Potassium: 3.6 mmol/L (ref 3.5–5.1)
Sodium: 139 mmol/L (ref 135–145)

## 2020-07-24 LAB — SURGICAL PCR SCREEN
MRSA, PCR: NEGATIVE
Staphylococcus aureus: NEGATIVE

## 2020-07-24 LAB — GLUCOSE, CAPILLARY: Glucose-Capillary: 88 mg/dL (ref 70–99)

## 2020-07-24 NOTE — Progress Notes (Signed)
COVID Vaccine Completed:no Date COVID Vaccine completed: COVID vaccine manufacturer: Pfizer    [employer]   PCP - Dr. Lenna Sciara. Cop[facility] LOV 06/03/20-epic Cardiologist - Dr. Debara Pickett LOV 05/19/20-epic Neurologist- Frann Rider LOV April 2022.  Chest x-ray - no EKG - 05/19/20-epic Stress Test - 05/29/20-epic ECHO - 04/15/19-epic Cardiac Cath - NA Pacemaker/ICD device last checked:NA  Sleep Study - no CPAP -   Fasting Blood Sugar - 90-140 Checks Blood Sugar _____ times a day. Once /week  Blood Thinner Instructions:Plavix for Hx of CVA mild Lt side weakness. Aspirin Instructions:Stop 7 days prior to DOS/ Fara Olden Last Dose:07/23/20  Anesthesia review:   Patient denies shortness of breath, fever, cough and chest pain at PAT appointment  Yes. Pt reports that she doesn't need her inhaler often. No SOB with working or with ADLs.  Patient verbalized understanding of instructions that were given to them at the PAT appointment. Patient was also instructed that they will need to review over the PAT instructions again at home before surgery. She has a cerebral aneurysm being monitored.

## 2020-07-25 NOTE — Progress Notes (Signed)
Anesthesia Chart Review   Case: [id number] Date/Time: 07/30/20 0715   Procedure: REVERSE SHOULDER ARTHROPLASTY (Right: Shoulder)   Anesthesia type: Choice   Pre-op diagnosis: djd right shoulder   Location: [facility] ROOM 06 / [facility]   Surgeons: Hiram Gash, MD       DISCUSSION:70 y.o. never smoker with h/o GERD, HTN, CHF, stroke, asthma, right shoulder djd scheduled for above procedure 07/30/20 with Dr. Ophelia Charter.   Pt last seen by cardiology 05/19/20. Per OV note,  "Pre-Op Evaluation - Patient has upcoming shoulder surgery planned. - Patient stable from a cardiac standpoint. No angina, acute CHF symptoms, or syncope. Myoview last year in 05/2019 was low risk with no evidence of ischemia. - Per Revised Risk Index, considered low risk with 0.9% chance of adverse cardiac complications. Able to complete >4.0 METS. Therefore, based on ACC/AHA guidelines, patient would be at acceptable risk for the planned procedure without further cardiovascular testing. OK to hold Plavix for 5 days from a cardiac standpoint; however, patient on Plavix for recurrent TIAs so would ultimately defer this to PCP/Neurology. I will route this recommendation to the requesting party via Epic fax function."  Per neurology notes 12/10/2019, "Cleared to proceed with surgical procedure of right shoulder replacement from a stroke standpoint as she has been stable since her prior TIA and 2017 without new or reoccurring stroke/TIA symptoms.  Discussed holding Plavix for 3 to 5 days or as advised by orthopedic surgeon with small but acceptable preprocedure risk of stroke.  Advised to restart Plavix immediately after procedure or once hemodynamically stable and advised by orthopedic surgeon.  This was fully discussed with patient and she verbalized understanding."  Anticipate pt can proceed with planned procedure barring acute status change.   VS: BP (!) 145/63   Pulse 70   Temp 36.8 C (Oral)   Resp 20   Ht _0  (1.549 m)   Wt 82.6  kg   SpO2 100%   BMI 34.39 kg/m   PROVIDERS: Copland, Gay Filler, MD is PCP   Cardiologist:  Pixie Casino, MD  LABS: Labs reviewed: Acceptable for surgery. (all labs ordered are listed, but only abnormal results are displayed)  Labs Reviewed  SURGICAL PCR SCREEN  CBC  BASIC METABOLIC PANEL  GLUCOSE, CAPILLARY     IMAGES:   EKG: 05/19/20 Rate 58 bpm  Sinus bradycardia   CV: Stress Test 05/30/2019 Nuclear stress EF: 60%. The left ventricular ejection fraction is normal (55-65%). The study is normal. This is a low risk study. There was no ST segment deviation noted during stress.   Low risk stress nuclear study with normal perfusion and normal left ventricular regional and global systolic function.  Echo 04/15/2015 Study Conclusions   - Left ventricle: The cavity size was normal. Wall thickness was    normal. Systolic function was normal. The estimated ejection    fraction was in the range of 60% to 65%. Wall motion was normal;    there were no regional wall motion abnormalities.  Past Medical History:  Diagnosis Date   Asthma    Cerebral aneurysm    being watched by Dr. Kathee Delton every 6 months   Complication of anesthesia HYPOTENSION INTRAOP W/ HYSTERECTOMY IN 1987--  DID OK W/ TOTAL KNEE IN 2008   Diabetes mellitus    DJD (degenerative joint disease)    Dyslipidemia    GERD (gastroesophageal reflux disease)    H/O hiatal hernia    History of CHF (congestive heart failure)  2010-  RESOLVED   History of peptic ulcer YRS AGO   Hypertension    Meniere's disease    Mitral valve prolapse occasional palpitations w/ chest discomfort   OA (osteoarthritis)    Rotator cuff tear, left    Stroke [facility])     Past Surgical History:  Procedure Laterality Date   BUNIONECTOMY  2007   LEFT FOOT   IR ANGIO INTRA EXTRACRAN SEL COM CAROTID INNOMINATE BILAT MOD SED  03/22/2019   IR ANGIO VERTEBRAL SEL SUBCLAVIAN INNOMINATE UNI L MOD SED  03/22/2019   IR ANGIO VERTEBRAL SEL  VERTEBRAL UNI R MOD SED  03/22/2019   IR US GUIDE VASC ACCESS RIGHT  03/22/2019   LEFT SHOULDER SURGERY  1997   ROTATOR CUFF REPAIR   SHOULDER ARTHROSCOPY  09/20/2011   Procedure: ARTHROSCOPY SHOULDER;  Surgeon: Johnn Hai, MD;  Location: [facility];  Service: Orthopedics;  Laterality: Left;  SAD AND MINI OPEN ROTATOR CUFF REPAIR     TOTAL KNEE ARTHROPLASTY  09-12-2006  [facility]   RIGHT KNEE   TOTAL KNEE ARTHROPLASTY Left 11/30/2017   Procedure: LEFT TOTAL KNEE REPLACEMENT;  Surgeon: [name], MD;  Location: [facility];  Service: Orthopedics;  Laterality: Left;   TUBAL LIGATION  1976   TYMPANOPLASTY  1990;   [place]:  albuterol (VENTOLIN HFA) 108 (90 Base) MCG/ACT inhaler   amoxicillin (AMOXIL) 500 MG capsule   blood glucose meter kit and supplies KIT   Blood Glucose Monitoring Suppl (ONE TOUCH ULTRA 2) w/Device KIT   cetirizine (ZYRTEC) 10 MG tablet   Cholecalciferol (VITAMIN D3) 50 MCG (2000 UT) CAPS   clopidogrel (PLAVIX) 75 MG tablet   empagliflozin (JARDIANCE) 10 MG TABS tablet   famotidine (PEPCID) 20 MG tablet   fluticasone (FLOVENT HFA) 220 MCG/ACT inhaler   furosemide (LASIX) 20 MG tablet   gabapentin (NEURONTIN) 300 MG capsule   glucose blood (ACCU-CHEK AVIVA PLUS) test strip   glucose blood (ONETOUCH ULTRA) test strip   ipratropium (ATROVENT) 0.03 % nasal spray   Lancets Misc. (ACCU-CHEK SOFTCLIX LANCET DEV) KIT   linagliptin (TRADJENTA) 5 MG TABS tablet   lisinopril (ZESTRIL) 20 MG tablet   metFORMIN (GLUCOPHAGE) 500 MG tablet   metoprolol tartrate (LOPRESSOR) 25 MG tablet   Multiple Vitamins-Minerals (MULTIVITAMIN PO)   OneTouch Delica Lancets 94C MISC   No current facility-administered medications for this encounter.     Konrad Felix, PA-C [facility] Pre-Surgical Testing [phone]

## 2020-07-25 NOTE — Anesthesia Preprocedure Evaluation (Addendum)
Anesthesia Evaluation  Patient identified by MRN, date of birth, ID band Patient awake    Reviewed: Allergy & Precautions, NPO status , Patient's Chart, lab work & pertinent test results  History of Anesthesia Complications Negative for: history of anesthetic complications  Airway Mallampati: II  TM Distance: >3 FB Neck ROM: Full    Dental  (+) Dental Advisory Given   Pulmonary asthma ,    Pulmonary exam normal        Cardiovascular hypertension, Pt. on medications and Pt. on home beta blockers Normal cardiovascular exam   '21 Myoperfusion - Nuclear stress EF: 60%. The left ventricular ejection fraction is normal (55-65%). The study is normal. This is a low risk study. There was no ST segment deviation noted during stress.     Neuro/Psych  Headaches, PSYCHIATRIC DISORDERS Anxiety Depression  Intracerebral aneurysm Meniere's disease Vertigo  TIA   GI/Hepatic Neg liver ROS, hiatal hernia, GERD  Controlled,  Endo/Other  diabetes, Type 2, Oral Hypoglycemic Agents  Renal/GU negative Renal ROS     Musculoskeletal  (+) Arthritis , Osteoarthritis,    Abdominal   Peds  Hematology  On plavix    Anesthesia Other Findings Covid test negative   Reproductive/Obstetrics                          Anesthesia Physical Anesthesia Plan  ASA: 3  Anesthesia Plan: General   Post-op Pain Management:  Regional for Post-op pain   Induction: Intravenous  PONV Risk Score and Plan: 3 and Treatment may vary due to age or medical condition, Ondansetron, Dexamethasone and Scopolamine patch - Pre-op  Airway Management Planned: Oral ETT  Additional Equipment: None  Intra-op Plan:   Post-operative Plan: Extubation in OR  Informed Consent: I have reviewed the patients History and Physical, chart, labs and discussed the procedure including the risks, benefits and alternatives for the proposed anesthesia  with the patient or authorized representative who has indicated his/her understanding and acceptance.     Dental advisory given  Plan Discussed with: CRNA and Anesthesiologist  Anesthesia Plan Comments:       Anesthesia Quick Evaluation

## 2020-07-28 ENCOUNTER — Other Ambulatory Visit (HOSPITAL_COMMUNITY)
Admission: RE | Admit: 2020-07-28 | Discharge: 2020-07-28 | Disposition: A | Discharge status: Home / Self Care | Payer: Medicare Other | Source: Ambulatory Visit | Attending: Orthopaedic Surgery | Admitting: Orthopaedic Surgery

## 2020-07-28 DIAGNOSIS — Z01812 Encounter for preprocedural laboratory examination: Secondary | ICD-10-CM | POA: Insufficient documentation

## 2020-07-28 DIAGNOSIS — Z20822 Contact with and (suspected) exposure to covid-19: Secondary | ICD-10-CM | POA: Insufficient documentation

## 2020-07-28 LAB — SARS CORONAVIRUS 2 (TAT 6-24 HRS): SARS Coronavirus 2: NEGATIVE

## 2020-07-28 NOTE — H&P (Signed)
PREOPERATIVE H&P  Chief Complaint: djd right shoulder  HPI: Sherry [name] is a 70 y.o. female who is scheduled for Procedure(s): REVERSE SHOULDER ARTHROPLASTY.   Patient has a past medical history significant for diabetes, hypertension and coronory artery disease, brain aneurysm, GERD, asthma, TIAs with the last one being in 2017 on Plavix.   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  She was transfering a child when the child's G-tube snagged on a chair nearby. She stumbled to prevent dropping the baby. She felt a pop and a tearing sensation in her right shoulder.  She had immediate pain, decreased range of motion, and weakness in the shoulder. She has failed a trial of conservative measures with medications and a home exercise program.   Her symptoms are rated as moderate to severe, and have been worsening.  This is significantly impairing activities of daily living.    Please see clinic note for further details on this patient's care.    She has elected for surgical management.   Past Medical History:  Diagnosis Date   Asthma    Cerebral aneurysm    being watched by Dr. Kathee Delton every 6 months   Complication of anesthesia HYPOTENSION INTRAOP W/ HYSTERECTOMY IN 1987--  DID [place] 2008   Diabetes mellitus    DJD (degenerative joint disease)    Dyslipidemia    GERD (gastroesophageal reflux disease)    H/O hiatal hernia    History of CHF (congestive heart failure) 2010-  RESOLVED   History of peptic ulcer YRS AGO   Hypertension    Meniere's disease    Mitral valve prolapse occasional palpitations w/ chest discomfort   OA (osteoarthritis)    Rotator cuff tear, left    Stroke [facility])    Past Surgical History:  Procedure Laterality Date   BUNIONECTOMY  2007   LEFT FOOT   IR ANGIO INTRA EXTRACRAN SEL COM CAROTID INNOMINATE BILAT MOD SED  03/22/2019   IR ANGIO VERTEBRAL SEL SUBCLAVIAN INNOMINATE UNI L  MOD SED  03/22/2019   IR ANGIO VERTEBRAL SEL VERTEBRAL UNI R MOD SED  03/22/2019   IR US GUIDE VASC ACCESS RIGHT  03/22/2019   LEFT SHOULDER SURGERY  1997   ROTATOR CUFF REPAIR   SHOULDER ARTHROSCOPY  09/20/2011   Procedure: ARTHROSCOPY SHOULDER;  Surgeon: Johnn Hai, MD;  Location: [place];  Service: Orthopedics;  Laterality: Left;  SAD AND MINI OPEN ROTATOR CUFF REPAIR     TOTAL KNEE ARTHROPLASTY  09-12-2006  [facility]   RIGHT KNEE   TOTAL KNEE ARTHROPLASTY Left 11/30/2017   Procedure: LEFT TOTAL KNEE REPLACEMENT;  Surgeon: [name], MD;  Location: [place];  Service: Orthopedics;  Laterality: Left;   TUBAL LIGATION  1976   TYMPANOPLASTY  1990;   [place]   Social History   Socioeconomic History   Marital status: Widowed    Spouse name: Not on file   Number of children: 4   Years of education: BA   Highest education level: Not on file  Occupational History   Occupation: LPN - retired  Tobacco Use   Smoking status: Never   Smokeless tobacco: Never  Vaping Use   Vaping Use: Never used  Substance and Sexual Activity   Alcohol use: No   Drug use: No   Sexual activity: Not on file  Other Topics Concern   Not on  file  Social History Narrative   Not on file   Social Determinants of Health   Financial Resource Strain: Medium Risk   Difficulty of Paying Living Expenses: Somewhat hard  Food Insecurity: No Food Insecurity   Worried About Charity fundraiser in the Last Year: Never true   Ran Out of Food in the Last Year: Never true  Transportation Needs: No Transportation Needs   Lack of Transportation (Medical): No   Lack of Transportation (Non-Medical): No  Physical Activity: Inactive   Days of Exercise per Week: 0 days   Minutes of Exercise per Session: 0 min  Stress: No Stress Concern Present   Feeling of Stress : Only a little  Social Connections: Moderately Integrated   Frequency of Communication with Friends and Family:  More than three times a week   Frequency of Social Gatherings with Friends and Family: More than three times a week   Attends Religious Services: More than 4 times per year   Active Member of [employer] or Organizations: Yes   Attends Archivist Meetings: 1 to 4 times per year   Marital Status: Widowed   Family History  Problem Relation Age of Onset   Heart disease Mother        in her 87s, heart attack   Diabetes Mother    Kidney disease Mother        dialysis   Thyroid disease Mother    Aneurysm Mother    Ovarian cancer Mother    Breast cancer Mother    Thyroid disease Sister    Heart disease Sister    Lymphoma Father    Diabetes Father    Hypertension Father    Congestive Heart Failure Father    Thyroid disease Brother    Thyroid disease Paternal Grandmother    Heart attack Sister        at age 69   Stroke Sister    Hypertension Sister    Breast cancer Maternal Aunt    Breast cancer Maternal Grandmother    Breast cancer Maternal Aunt    Breast cancer Maternal Aunt    Other Brother        Car accident   Bipolar disorder Son    Other Son        GSW   Allergies  Allergen Reactions   Influenza Vaccines Anaphylaxis   Influenza Virus Vaccine Anaphylaxis   Demerol Other (See Comments)    SEIZURE   Nsaids Other (See Comments)    NAPROXEN, IBUPROFEN, SKELAXIN---  CAUSES PALPITATIONS   Prednisone Other (See Comments)    Caused her glucose to go up to 500 and went to ED   Tramadol Other (See Comments)    Contraindicated with Victoza    Codeine Itching   Mango Flavor [Flavoring Agent] Itching   Prior to Admission medications   Medication Sig Start Date End Date Taking? Authorizing Provider  albuterol (VENTOLIN HFA) 108 (90 Base) MCG/ACT inhaler Inhale 2 puffs into the lungs every 6 (six) hours as needed for wheezing or shortness of breath. 04/28/20  Yes Copland, Gay Filler, MD  amoxicillin (AMOXIL) 500 MG capsule Take 2,000 mg by mouth See admin instructions. For  dental procedures 04/30/20  Yes [provider]  cetirizine (ZYRTEC) 10 MG tablet Take 1 tablet (10 mg total) by mouth daily. 07/16/16  Yes Copland, Gay Filler, MD  Cholecalciferol (VITAMIN D3) 50 MCG (2000 UT) CAPS Take 2,000 Units by mouth daily. In morning   Yes [provider]  clopidogrel (PLAVIX) 75 MG tablet Take 1 tablet (75 mg total) by mouth daily. 09/28/19  Yes Copland, Gay Filler, MD  empagliflozin (JARDIANCE) 10 MG TABS tablet Take 1 tablet (10 mg total) by mouth daily. Take 0.5 tablets (5 mg total) daily. Patient taking differently: Take 5 mg by mouth daily. Take 0.5 tablets (5 mg total) daily. 09/28/19  Yes Copland, Gay Filler, MD  famotidine (PEPCID) 20 MG tablet Take 20 mg by mouth daily.    Yes [provider]  fluticasone (FLOVENT HFA) 220 MCG/ACT inhaler Inhale 1 puff into the lungs 2 (two) times daily. May increase to 2 puffs if needed Patient taking differently: Inhale 1 puff into the lungs 2 (two) times daily as needed (asthma). May increase to 2 puffs if needed 01/25/20  Yes Copland, Gay Filler, MD  furosemide (LASIX) 20 MG tablet Take 1 tablet (20 mg total) by mouth daily. 09/28/19  Yes Copland, Gay Filler, MD  gabapentin (NEURONTIN) 300 MG capsule TAKE 2 CAPSULES BY MOUTH TWO TIMES DAILY Patient taking differently: Take 600 mg by mouth in the morning and at bedtime. TAKE 2 CAPSULES BY MOUTH TWO TIMES DAILY 09/28/19  Yes Copland, Gay Filler, MD  ipratropium (ATROVENT) 0.03 % nasal spray Place 2 sprays into the nose 4 (four) times daily. Patient taking differently: Place 1 spray into the nose every morning. 04/30/20  Yes Copland, Gay Filler, MD  linagliptin (TRADJENTA) 5 MG TABS tablet TAKE 1/2 TABLET BY MOUTH DAILY *NEED OFFICE VISIT* Patient taking differently: Take 2.5 mg by mouth daily. TAKE 1/2 TABLET BY MOUTH DAILY *NEED OFFICE VISIT* 09/28/19  Yes Copland, Gay Filler, MD  lisinopril (ZESTRIL) 20 MG tablet Take 1 tablet (20 mg total) by mouth daily. 09/28/19   Yes Copland, Gay Filler, MD  metFORMIN (GLUCOPHAGE) 500 MG tablet TAKE 2 TABLETS BY MOUTH TWO TIMES DAILY Patient taking differently: Take 1,000 mg by mouth 2 (two) times daily with a meal. TAKE 2 TABLETS BY MOUTH TWO TIMES DAILY 09/28/19  Yes Copland, Gay Filler, MD  metoprolol tartrate (LOPRESSOR) 25 MG tablet Take 1 tablet (25 mg total) by mouth daily. Patient taking differently: Take 12.5 mg by mouth 2 (two) times daily. 09/28/19  Yes Copland, Gay Filler, MD  Multiple Vitamins-Minerals (MULTIVITAMIN PO) Take 1 tablet by mouth daily.   Yes [provider]  blood glucose meter kit and supplies KIT Dispense based on patient and insurance preference. Use up to four times daily as directed. (FOR ICD-9 250.00, 250.01). Pt uses once a day 03/23/18   Copland, Gay Filler, MD  Blood Glucose Monitoring Suppl (ONE TOUCH ULTRA 2) w/Device KIT Use to check glucose up to twice daily. E11.9 dx code 10/29/19   Copland, Gay Filler, MD  glucose blood (ACCU-CHEK AVIVA PLUS) test strip Use as instructed 02/14/18   Copland, Gay Filler, MD  glucose blood (ONETOUCH ULTRA) test strip Use to check glucose up to twice daily. E11.9 dx code 10/29/19   Copland, Gay Filler, MD  Lancets Misc. (ACCU-CHEK SOFTCLIX LANCET DEV) KIT USE AS DIRECTED 03/18/17   Renato Shin, MD  OneTouch Delica Lancets 62I MISC Use to check glucose up to twice daily. E11.9 dx code 10/29/19   Copland, Gay Filler, MD    ROS: All other systems have been reviewed and were otherwise negative with the exception of those mentioned in the HPI and as above.  Physical Exam: General: Alert, no acute distress Cardiovascular: No pedal edema Respiratory: No cyanosis, no use of accessory  musculature GI: No organomegaly, abdomen is soft and non-tender Skin: No lesions in the area of chief complaint Neurologic: Sensation intact distally Psychiatric: Patient is competent for consent with normal mood and affect Lymphatic: No axillary or cervical  lymphadenopathy  MUSCULOSKELETAL:  Right shoulder: Active forward elevation is to 80 degrees with shoulder hike passively to 90 degrees, external rotation 0 degrees and internal rotation to her back pocket.    Imaging: MRI of the right shoulder was done 08/02/2019 which demonstrated complete tear of the supraspinatus with 3.2 cm retraction extending slightly into the infraspinatus.  She was noted to have moderate atrophy of the supraspinatus and infraspinatus with some tendinosis of the intra-articular portion of the biceps tendon and severe AC joint arthropathy.  Assessment: djd right shoulder  Plan: Plan for Procedure(s): REVERSE SHOULDER ARTHROPLASTY  The risks benefits and alternatives were discussed with the patient including but not limited to the risks of nonoperative treatment, versus surgical intervention including infection, bleeding, nerve injury,  blood clots, cardiopulmonary complications, morbidity, mortality, among others, and they were willing to proceed.   We additionally specifically discussed risks of axillary nerve injury, infection, periprosthetic fracture, continued pain and longevity of implants prior to beginning procedure.    Patient will be closely monitored in PACU for medical stabilization and pain control. If found stable in PACU, patient may be discharged home with outpatient follow-up. If any concerns regarding patient's stabilization patient will be admitted for observation after surgery. The patient is planning to be discharged home with outpatient PT.   The patient acknowledged the explanation, agreed to proceed with the plan and consent was signed.   She has received clearance from her PCP, Dr. Lorelei Pont, dentist, Dr. Hassan Rowan, cardiologist, Dr. Debara Pickett, and neurologist, Dr. Estanislado Pandy.   Per cardiology note, - Per Revised Risk Index, considered low risk with 0.9% chance of adverse cardiac complications. Able to complete >4.0 METS. Therefore, based on ACC/AHA  guidelines, patient would be at acceptable risk for the planned procedure without further cardiovascular testing. OK to hold Plavix for 5 days from a cardiac standpoint; however, patient on Plavix for recurrent TIAs so would ultimately defer this to PCP/Neurology."   Per neurology notes 12/10/2019, "Cleared to proceed with surgical procedure of right shoulder replacement from a stroke standpoint as she has been stable since her prior TIA and 2017 without new or reoccurring stroke/TIA symptoms.  Discussed holding Plavix for 3 to 5 days or as advised by orthopedic surgeon with small but acceptable preprocedure risk of stroke.  Advised to restart Plavix immediately after procedure or once hemodynamically stable and advised by orthopedic surgeon.  This was fully discussed with patient and she verbalized understanding."  Operative Plan: Right reverse total shoulder arthroplasty  Discharge Medications: Tylenol, Gabapentin, Oxycodone, Zofran DVT Prophylaxis: Resume Plavix Physical Therapy: Outpatient PT Special Discharge needs: Sling. Howell, PA-C  07/28/2020 3:46 PM

## 2020-07-30 ENCOUNTER — Ambulatory Visit (HOSPITAL_COMMUNITY): Payer: No Typology Code available for payment source | Admitting: Physician Assistant

## 2020-07-30 ENCOUNTER — Encounter (HOSPITAL_COMMUNITY)
Admission: RE | Disposition: A | Discharge status: Home / Self Care | Payer: Self-pay | Source: Home / Self Care | Attending: Orthopaedic Surgery

## 2020-07-30 ENCOUNTER — Ambulatory Visit (HOSPITAL_COMMUNITY)
Admission: RE | Admit: 2020-07-30 | Discharge: 2020-07-30 | Disposition: A | Discharge status: Home / Self Care | Payer: No Typology Code available for payment source | Attending: Orthopaedic Surgery | Admitting: Orthopaedic Surgery

## 2020-07-30 ENCOUNTER — Encounter (HOSPITAL_COMMUNITY): Payer: Self-pay | Admitting: Orthopaedic Surgery

## 2020-07-30 ENCOUNTER — Ambulatory Visit (HOSPITAL_COMMUNITY): Payer: No Typology Code available for payment source | Admitting: Anesthesiology

## 2020-07-30 ENCOUNTER — Ambulatory Visit (HOSPITAL_COMMUNITY): Payer: No Typology Code available for payment source

## 2020-07-30 DIAGNOSIS — Z09 Encounter for follow-up examination after completed treatment for conditions other than malignant neoplasm: Secondary | ICD-10-CM

## 2020-07-30 DIAGNOSIS — Z8673 Personal history of transient ischemic attack (TIA), and cerebral infarction without residual deficits: Secondary | ICD-10-CM | POA: Diagnosis not present

## 2020-07-30 DIAGNOSIS — Z79899 Other long term (current) drug therapy: Secondary | ICD-10-CM | POA: Diagnosis not present

## 2020-07-30 DIAGNOSIS — Z96653 Presence of artificial knee joint, bilateral: Secondary | ICD-10-CM | POA: Insufficient documentation

## 2020-07-30 DIAGNOSIS — J45909 Unspecified asthma, uncomplicated: Secondary | ICD-10-CM | POA: Diagnosis not present

## 2020-07-30 DIAGNOSIS — Z886 Allergy status to analgesic agent status: Secondary | ICD-10-CM | POA: Diagnosis not present

## 2020-07-30 DIAGNOSIS — Z887 Allergy status to serum and vaccine status: Secondary | ICD-10-CM | POA: Insufficient documentation

## 2020-07-30 DIAGNOSIS — I10 Essential (primary) hypertension: Secondary | ICD-10-CM | POA: Diagnosis not present

## 2020-07-30 DIAGNOSIS — W1849XA Other slipping, tripping and stumbling without falling, initial encounter: Secondary | ICD-10-CM | POA: Diagnosis not present

## 2020-07-30 DIAGNOSIS — I671 Cerebral aneurysm, nonruptured: Secondary | ICD-10-CM | POA: Diagnosis not present

## 2020-07-30 DIAGNOSIS — Z7984 Long term (current) use of oral hypoglycemic drugs: Secondary | ICD-10-CM | POA: Diagnosis not present

## 2020-07-30 DIAGNOSIS — Z885 Allergy status to narcotic agent status: Secondary | ICD-10-CM | POA: Insufficient documentation

## 2020-07-30 DIAGNOSIS — M25711 Osteophyte, right shoulder: Secondary | ICD-10-CM | POA: Diagnosis not present

## 2020-07-30 DIAGNOSIS — Y990 Civilian activity done for income or pay: Secondary | ICD-10-CM | POA: Insufficient documentation

## 2020-07-30 DIAGNOSIS — I2510 Atherosclerotic heart disease of native coronary artery without angina pectoris: Secondary | ICD-10-CM | POA: Diagnosis not present

## 2020-07-30 DIAGNOSIS — S46011A Strain of muscle(s) and tendon(s) of the rotator cuff of right shoulder, initial encounter: Secondary | ICD-10-CM | POA: Insufficient documentation

## 2020-07-30 DIAGNOSIS — Y92009 Unspecified place in unspecified non-institutional (private) residence as the place of occurrence of the external cause: Secondary | ICD-10-CM | POA: Insufficient documentation

## 2020-07-30 DIAGNOSIS — Z7902 Long term (current) use of antithrombotics/antiplatelets: Secondary | ICD-10-CM | POA: Diagnosis not present

## 2020-07-30 DIAGNOSIS — Z888 Allergy status to other drugs, medicaments and biological substances status: Secondary | ICD-10-CM | POA: Insufficient documentation

## 2020-07-30 DIAGNOSIS — Z7951 Long term (current) use of inhaled steroids: Secondary | ICD-10-CM | POA: Insufficient documentation

## 2020-07-30 DIAGNOSIS — E119 Type 2 diabetes mellitus without complications: Secondary | ICD-10-CM | POA: Insufficient documentation

## 2020-07-30 DIAGNOSIS — Y93F2 Activity, caregiving, lifting: Secondary | ICD-10-CM | POA: Diagnosis not present

## 2020-07-30 DIAGNOSIS — K219 Gastro-esophageal reflux disease without esophagitis: Secondary | ICD-10-CM | POA: Diagnosis not present

## 2020-07-30 HISTORY — PX: REVERSE SHOULDER ARTHROPLASTY: SHX5054

## 2020-07-30 LAB — GLUCOSE, CAPILLARY
Glucose-Capillary: 126 mg/dL — ABNORMAL HIGH (ref 70–99)
Glucose-Capillary: 136 mg/dL — ABNORMAL HIGH (ref 70–99)

## 2020-07-30 SURGERY — ARTHROPLASTY, SHOULDER, TOTAL, REVERSE
Anesthesia: General | Site: Shoulder | Laterality: Right

## 2020-07-30 MED ORDER — CEFAZOLIN SODIUM-DEXTROSE 2-4 GM/100ML-% IV SOLN
2.0000 g | INTRAVENOUS | Status: AC
  Administered 2020-07-30: 2 g via INTRAVENOUS
  Filled 2020-07-30: qty 100

## 2020-07-30 MED ORDER — FENTANYL CITRATE (PF) 100 MCG/2ML IJ SOLN
INTRAMUSCULAR | Status: DC | PRN
  Administered 2020-07-30: 50 ug via INTRAVENOUS
  Administered 2020-07-30 (×2): 25 ug via INTRAVENOUS

## 2020-07-30 MED ORDER — LIDOCAINE 2% (20 MG/ML) 5 ML SYRINGE
INTRAMUSCULAR | Status: AC
  Filled 2020-07-30: qty 5

## 2020-07-30 MED ORDER — PROPOFOL 10 MG/ML IV BOLUS
INTRAVENOUS | Status: DC | PRN
  Administered 2020-07-30: 130 mg via INTRAVENOUS

## 2020-07-30 MED ORDER — STERILE WATER FOR IRRIGATION IR SOLN
Status: DC | PRN
  Administered 2020-07-30: 2000 mL

## 2020-07-30 MED ORDER — 0.9 % SODIUM CHLORIDE (POUR BTL) OPTIME
TOPICAL | Status: DC | PRN
  Administered 2020-07-30: 1000 mL

## 2020-07-30 MED ORDER — BUPIVACAINE HCL (PF) 0.5 % IJ SOLN
INTRAMUSCULAR | Status: DC | PRN
  Administered 2020-07-30: 15 mL via PERINEURAL

## 2020-07-30 MED ORDER — ONDANSETRON HCL 4 MG/2ML IJ SOLN
INTRAMUSCULAR | Status: AC
  Filled 2020-07-30: qty 2

## 2020-07-30 MED ORDER — CHLORHEXIDINE GLUCONATE 0.12 % MT SOLN
15.0000 mL | Freq: Once | OROMUCOSAL | Status: AC
Start: 2020-07-30 — End: 2020-07-30
  Administered 2020-07-30: 15 mL via OROMUCOSAL

## 2020-07-30 MED ORDER — FENTANYL CITRATE (PF) 100 MCG/2ML IJ SOLN
25.0000 ug | INTRAMUSCULAR | Status: DC | PRN
  Administered 2020-07-30: 50 ug via INTRAVENOUS

## 2020-07-30 MED ORDER — PHENYLEPHRINE HCL-NACL 10-0.9 MG/250ML-% IV SOLN
INTRAVENOUS | Status: DC | PRN
  Administered 2020-07-30: 30 ug/min via INTRAVENOUS

## 2020-07-30 MED ORDER — ONDANSETRON HCL 4 MG/2ML IJ SOLN
4.0000 mg | Freq: Once | INTRAMUSCULAR | Status: AC | PRN
  Administered 2020-07-30: 4 mg via INTRAVENOUS

## 2020-07-30 MED ORDER — LACTATED RINGERS IV BOLUS
250.0000 mL | Freq: Once | INTRAVENOUS | Status: AC
  Administered 2020-07-30: 250 mL via INTRAVENOUS

## 2020-07-30 MED ORDER — ONDANSETRON HCL 4 MG PO TABS: 4.0000 mg | ORAL_TABLET | Freq: Three times a day (TID) | ORAL | 0 refills | Status: AC | PRN

## 2020-07-30 MED ORDER — VANCOMYCIN HCL 1 G IV SOLR
INTRAVENOUS | Status: DC | PRN
  Administered 2020-07-30: 1000 mg via TOPICAL

## 2020-07-30 MED ORDER — ORAL CARE MOUTH RINSE: 15.0000 mL | Freq: Once | OROMUCOSAL | Status: AC

## 2020-07-30 MED ORDER — TRANEXAMIC ACID-NACL 1000-0.7 MG/100ML-% IV SOLN
1000.0000 mg | INTRAVENOUS | Status: AC
  Administered 2020-07-30: 1000 mg via INTRAVENOUS
  Filled 2020-07-30: qty 100

## 2020-07-30 MED ORDER — LACTATED RINGERS IV SOLN: INTRAVENOUS | Status: DC

## 2020-07-30 MED ORDER — MIDAZOLAM HCL 5 MG/5ML IJ SOLN
INTRAMUSCULAR | Status: DC | PRN
  Administered 2020-07-30: 1 mg via INTRAVENOUS

## 2020-07-30 MED ORDER — METHOCARBAMOL 500 MG PO TABS: 500.0000 mg | ORAL_TABLET | Freq: Three times a day (TID) | ORAL | 0 refills | Status: DC | PRN

## 2020-07-30 MED ORDER — SODIUM CHLORIDE 0.9 % IR SOLN
Status: DC | PRN
  Administered 2020-07-30: 1000 mL

## 2020-07-30 MED ORDER — ROCURONIUM BROMIDE 10 MG/ML (PF) SYRINGE
PREFILLED_SYRINGE | INTRAVENOUS | Status: DC | PRN
  Administered 2020-07-30: 20 mg via INTRAVENOUS
  Administered 2020-07-30: 60 mg via INTRAVENOUS

## 2020-07-30 MED ORDER — BUPIVACAINE LIPOSOME 1.3 % IJ SUSP
INTRAMUSCULAR | Status: DC | PRN
  Administered 2020-07-30: 10 mL via PERINEURAL

## 2020-07-30 MED ORDER — PHENYLEPHRINE 40 MCG/ML (10ML) SYRINGE FOR IV PUSH (FOR BLOOD PRESSURE SUPPORT)
PREFILLED_SYRINGE | INTRAVENOUS | Status: DC | PRN
  Administered 2020-07-30: 80 ug via INTRAVENOUS

## 2020-07-30 MED ORDER — LACTATED RINGERS IV BOLUS
500.0000 mL | Freq: Once | INTRAVENOUS | Status: AC
  Administered 2020-07-30: 500 mL via INTRAVENOUS

## 2020-07-30 MED ORDER — PROPOFOL 10 MG/ML IV BOLUS
INTRAVENOUS | Status: AC
  Filled 2020-07-30: qty 40

## 2020-07-30 MED ORDER — SUGAMMADEX SODIUM 200 MG/2ML IV SOLN
INTRAVENOUS | Status: DC | PRN
  Administered 2020-07-30: 165.2 mg via INTRAVENOUS

## 2020-07-30 MED ORDER — DEXAMETHASONE SODIUM PHOSPHATE 10 MG/ML IJ SOLN
INTRAMUSCULAR | Status: AC
  Filled 2020-07-30: qty 1

## 2020-07-30 MED ORDER — FENTANYL CITRATE (PF) 100 MCG/2ML IJ SOLN
INTRAMUSCULAR | Status: AC
  Administered 2020-07-30: 50 ug via INTRAVENOUS
  Filled 2020-07-30: qty 2

## 2020-07-30 MED ORDER — FENTANYL CITRATE (PF) 100 MCG/2ML IJ SOLN
INTRAMUSCULAR | Status: AC
  Filled 2020-07-30: qty 2

## 2020-07-30 MED ORDER — PHENYLEPHRINE HCL-NACL 10-0.9 MG/250ML-% IV SOLN
INTRAVENOUS | Status: AC
  Filled 2020-07-30: qty 750

## 2020-07-30 MED ORDER — DEXAMETHASONE SODIUM PHOSPHATE 10 MG/ML IJ SOLN
INTRAMUSCULAR | Status: DC | PRN
  Administered 2020-07-30: 4 mg via INTRAVENOUS

## 2020-07-30 MED ORDER — VANCOMYCIN HCL 1000 MG IV SOLR
INTRAVENOUS | Status: AC
  Filled 2020-07-30: qty 1000

## 2020-07-30 MED ORDER — OXYCODONE HCL 5 MG PO TABS: ORAL_TABLET | ORAL | 0 refills | Status: AC

## 2020-07-30 MED ORDER — ONDANSETRON HCL 4 MG/2ML IJ SOLN
INTRAMUSCULAR | Status: DC | PRN
  Administered 2020-07-30: 4 mg via INTRAVENOUS

## 2020-07-30 MED ORDER — ROCURONIUM BROMIDE 10 MG/ML (PF) SYRINGE
PREFILLED_SYRINGE | INTRAVENOUS | Status: AC
  Filled 2020-07-30: qty 10

## 2020-07-30 MED ORDER — MIDAZOLAM HCL 2 MG/2ML IJ SOLN
INTRAMUSCULAR | Status: AC
  Filled 2020-07-30: qty 2

## 2020-07-30 MED ORDER — ACETAMINOPHEN 500 MG PO TABS
1000.0000 mg | ORAL_TABLET | Freq: Once | ORAL | Status: AC
  Administered 2020-07-30: 1000 mg via ORAL
  Filled 2020-07-30: qty 2

## 2020-07-30 MED ORDER — LIDOCAINE 2% (20 MG/ML) 5 ML SYRINGE
INTRAMUSCULAR | Status: DC | PRN
  Administered 2020-07-30: 20 mg via INTRAVENOUS

## 2020-07-30 MED ORDER — ACETAMINOPHEN 500 MG PO TABS: 1000.0000 mg | ORAL_TABLET | Freq: Three times a day (TID) | ORAL | 0 refills | Status: AC

## 2020-07-30 SURGICAL SUPPLY — 67 items
AID PSTN UNV HD RSTRNT DISP (MISCELLANEOUS) ×1
APL PRP STRL LF DISP 70% ISPRP (MISCELLANEOUS) ×2
BASEPLATE GLENOSPHERE 25 STD (Miscellaneous) ×1 IMPLANT
BIT DRILL 3.2 PERIPHERAL SCREW (BIT) ×1 IMPLANT
BLADE SAW SAG 73X25 THK (BLADE) ×1
BLADE SAW SGTL 73X25 THK (BLADE) ×1 IMPLANT
BSPLAT GLND STD 25 RVRS SHLDR (Miscellaneous) ×1 IMPLANT
CHLORAPREP W/TINT 26 (MISCELLANEOUS) ×4 IMPLANT
CLSR STERI-STRIP ANTIMIC 1/2X4 (GAUZE/BANDAGES/DRESSINGS) ×2 IMPLANT
COOLER ICEMAN CLASSIC (MISCELLANEOUS) IMPLANT
COVER BACK TABLE 60X90IN (DRAPES) IMPLANT
COVER SURGICAL LIGHT HANDLE (MISCELLANEOUS) ×2 IMPLANT
COVER WAND RF STERILE (DRAPES) ×2 IMPLANT
DRAPE INCISE IOBAN 66X45 STRL (DRAPES) ×2 IMPLANT
DRAPE ORTHO SPLIT 77X108 STRL (DRAPES) ×4
DRAPE SHEET LG 3/4 BI-LAMINATE (DRAPES) ×4 IMPLANT
DRAPE SURG ORHT 6 SPLT 77X108 (DRAPES) ×2 IMPLANT
DRSG AQUACEL AG ADV 3.5X 6 (GAUZE/BANDAGES/DRESSINGS) ×2 IMPLANT
ELECT BLADE TIP CTD 4 INCH (ELECTRODE) ×2 IMPLANT
ELECT REM PT RETURN 15FT ADLT (MISCELLANEOUS) ×2 IMPLANT
FACESHIELD WRAPAROUND (MASK) ×2 IMPLANT
GLENOSPHERE REV SHOULDER 36 (Joint) ×2 IMPLANT
GLOVE SRG 8 PF TXTR STRL LF DI (GLOVE) ×1 IMPLANT
GLOVE SURG ENC MOIS LTX SZ6.5 (GLOVE) ×4 IMPLANT
GLOVE SURG LTX SZ8 (GLOVE) ×4 IMPLANT
GLOVE SURG UNDER POLY LF SZ6.5 (GLOVE) ×2 IMPLANT
GLOVE SURG UNDER POLY LF SZ8 (GLOVE) ×2
GOWN STRL REUS W/TWL LRG LVL3 (GOWN DISPOSABLE) ×2 IMPLANT
GOWN STRL REUS W/TWL XL LVL3 (GOWN DISPOSABLE) ×2 IMPLANT
GUIDEWIRE GLENOID 2.5X220 (WIRE) ×2 IMPLANT
HANDPIECE INTERPULSE COAX TIP (DISPOSABLE) ×2
HEMOSTAT SURGICEL 2X14 (HEMOSTASIS) IMPLANT
IMPL REVERSE SHOULDER 0X3.5 (Shoulder) ×1 IMPLANT
IMPLANT REVERSE SHOULDER 0X3.5 (Shoulder) ×2 IMPLANT
INSERT HUMERAL 36X6MM 12.5DEG (Insert) ×2 IMPLANT
KIT BASIN OR (CUSTOM PROCEDURE TRAY) ×2 IMPLANT
KIT STABILIZATION SHOULDER (MISCELLANEOUS) ×2 IMPLANT
KIT TURNOVER KIT A (KITS) ×2 IMPLANT
MANIFOLD NEPTUNE II (INSTRUMENTS) ×2 IMPLANT
NDL MAYO CATGUT SZ4 TPR NDL (NEEDLE) IMPLANT
NEEDLE MAYO CATGUT SZ4 (NEEDLE) IMPLANT
NS IRRIG 1000ML POUR BTL (IV SOLUTION) ×2 IMPLANT
PACK SHOULDER (CUSTOM PROCEDURE TRAY) ×2 IMPLANT
PAD COLD SHLDR WRAP-ON (PAD) IMPLANT
PENCIL SMOKE EVACUATOR (MISCELLANEOUS) IMPLANT
RESTRAINT HEAD UNIVERSAL NS (MISCELLANEOUS) ×2 IMPLANT
SCREW 5.0X18 (Screw) ×1 IMPLANT
SCREW 5.5X22 (Screw) ×1 IMPLANT
SCREW BONE THREAD 6.5X35 (Screw) ×1 IMPLANT
SET HNDPC FAN SPRY TIP SCT (DISPOSABLE) ×1 IMPLANT
SLING ULTRA II L (ORTHOPEDIC SUPPLIES) IMPLANT
SLING ULTRA III MED (ORTHOPEDIC SUPPLIES) ×2 IMPLANT
STEM HUM AEQUALIS STD SZ1B 66 (Stem) ×2 IMPLANT
STRIP CLOSURE SKIN 1/2X4 (GAUZE/BANDAGES/DRESSINGS) ×1 IMPLANT
SUCTION FRAZIER HANDLE 12FR (TUBING) ×2
SUCTION TUBE FRAZIER 12FR DISP (TUBING) ×1 IMPLANT
SUT ETHIBOND 2 V 37 (SUTURE) ×2 IMPLANT
SUT ETHIBOND NAB CT1 #1 30IN (SUTURE) ×2 IMPLANT
SUT FIBERWIRE #5 38 CONV NDL (SUTURE)
SUT MNCRL AB 4-0 PS2 18 (SUTURE) ×2 IMPLANT
SUT VIC AB 0 CT1 36 (SUTURE) IMPLANT
SUT VIC AB 3-0 SH 27 (SUTURE) ×2
SUT VIC AB 3-0 SH 27X BRD (SUTURE) ×1 IMPLANT
SUTURE FIBERWR #5 38 CONV NDL (SUTURE) IMPLANT
TOWEL OR 17X26 10 PK STRL BLUE (TOWEL DISPOSABLE) ×2 IMPLANT
TUBE SUCTION HIGH CAP CLEAR NV (SUCTIONS) ×2 IMPLANT
WATER STERILE IRR 1000ML POUR (IV SOLUTION) ×4 IMPLANT

## 2020-07-30 NOTE — Transfer of Care (Signed)
Immediate Anesthesia Transfer of Care Note  Patient: Sherry [name]  Procedure(s) Performed: Procedure(s): REVERSE SHOULDER ARTHROPLASTY (Right)  Patient Location: PACU  Anesthesia Type:General  Level of Consciousness:  sedated, patient cooperative and responds to stimulation  Airway & Oxygen Therapy:Patient Spontanous Breathing and Patient connected to face mask oxgen  Post-op Assessment:  Report given to PACU RN and Post -op Vital signs reviewed and stable  Post vital signs:  Reviewed and stable  Last Vitals:  Vitals:   07/30/20 0520  BP: 139/68  Pulse: 63  Resp: 16  Temp: 36.6 C  SpO2: 48%    Complications: No apparent anesthesia complications

## 2020-07-30 NOTE — Anesthesia Postprocedure Evaluation (Signed)
Anesthesia Post Note  Patient: Sherry [name]  Procedure(s) Performed: REVERSE SHOULDER ARTHROPLASTY (Right: Shoulder)     Patient location during evaluation: PACU Anesthesia Type: General Level of consciousness: awake and alert Pain management: pain level controlled Vital Signs Assessment: post-procedure vital signs reviewed and stable Respiratory status: spontaneous breathing, nonlabored ventilation and respiratory function stable Cardiovascular status: blood pressure returned to baseline and stable Postop Assessment: no apparent nausea or vomiting Anesthetic complications: no   No notable events documented.  Last Vitals:  Vitals:   07/30/20 1015 07/30/20 1029  BP: 138/76 (!) 148/82  Pulse: (!) 57 74  Resp: 12 14  Temp: 36.5 C   SpO2: 100% 96%    Last Pain:  Vitals:   07/30/20 1029  TempSrc:   PainSc: [place]

## 2020-07-30 NOTE — Anesthesia Procedure Notes (Signed)
Procedure Name: Intubation Date/Time: 07/30/2020 7:38 AM Performed by: Lavina Hamman, CRNA Pre-anesthesia Checklist: Emergency Drugs available, Patient identified, Suction available, Patient being monitored and Timeout performed Patient Re-evaluated:Patient Re-evaluated prior to induction Oxygen Delivery Method: Circle system utilized Preoxygenation: Pre-oxygenation with 100% oxygen Induction Type: IV induction Ventilation: Mask ventilation without difficulty Laryngoscope Size: Mac and 3 Tube size: 7.5 mm Number of attempts: 1 Airway Equipment and Method: Stylet Placement Confirmation: positive ETCO2, ETT inserted through vocal cords under direct vision, CO2 detector and breath sounds checked- equal and bilateral Secured at: 22 cm Tube secured with: Tape Dental Injury: Teeth and Oropharynx as per pre-operative assessment

## 2020-07-30 NOTE — Discharge Instructions (Addendum)
Ophelia Charter MD, MPH Noemi Chapel, PA-C [place] [address] [phone] (tel)   [phone] (fax)   [place] may leave the operative dressing in place until your follow-up appointment. KEEP THE INCISIONS CLEAN AND DRY. There may be a small amount of fluid/bleeding leaking at the surgical site. This is normal after surgery.  If it fills with liquid or blood please call us immediately to change it for you. Use the provided ice machine or Ice packs as often as possible for the first 3-4 days, then as needed for pain relief.   Keep a layer of cloth or a shirt between your skin and the cooling unit to prevent frost bite as it can get very cold.  SHOWERING: - You may shower on Post-Op Day #2.  - The dressing is water resistant but do not scrub it as it may start to peel up.   - You may remove the sling for showering, but keep a water resistant pillow under the arm to keep both the elbow and shoulder away from the body (mimicking the abduction sling).  - Gently pat the area dry.  - Do not soak the shoulder in water. Do not go swimming in the pool or ocean until your incision has completely healed (about 4-6 weeks after surgery) - KEEP THE INCISIONS CLEAN AND DRY.  EXERCISES Wear the sling at all times  You may remove the sling for showering, but keep the arm across the chest or in a secondary sling.    Accidental/Purposeful External Rotation and shoulder flexion (reaching behind you) is to be avoided at all costs for the first month. It is ok to come out of your sling if your are sitting and have assistance for eating.   Do not lift anything heavier than 1 pound until we discuss it further in clinic.   REGIONAL ANESTHESIA (NERVE BLOCKS) The anesthesia team may have performed a nerve block for you if safe in the setting of your care.  This is a great tool used to minimize pain.   Typically the block may start wearing off overnight but the long acting medicine may last for 3-4 days.  The nerve block wearing off can be a challenging period but please utilize your as needed pain medications to try and manage this period.    POST-OP MEDICATIONS- Multimodal approach to pain control In general your pain will be controlled with a combination of substances.  Prescriptions unless otherwise discussed are electronically sent to your pharmacy.  This is a carefully made plan we use to minimize narcotic use.     Acetaminophen - Non-narcotic pain medicine taken on a scheduled basis  Take two 500 mg tablets (1,000 mg total) every 8 hours Robaxin - this is a muscle relaxer, take as needed for muscle spasms Oxycodone - This is a strong narcotic, to be used only on an "as needed" basis for SEVERE pain. Zofran -  take as needed for nausea   FOLLOW-UP If you develop a Fever (>101.5), Redness or Drainage from the surgical incision site, please call our office to arrange for an evaluation. Please call the office to schedule a follow-up appointment for a wound check, 7-10 days post-operatively.  IF YOU HAVE ANY QUESTIONS, PLEASE FEEL FREE TO CALL OUR OFFICE.  HELPFUL INFORMATION  If you had a block, it will wear off between 8-24 hrs postop typically.  This is period when  your pain may go from nearly zero to the pain you would have had post-op without the block.  This is an abrupt transition but nothing dangerous is happening.  You may take an extra dose of narcotic when this happens.  Your arm will be in a sling following surgery. You will be in this sling for the next 4 weeks.  I will let you know the exact duration at your follow-up visit.  You may be more comfortable sleeping in a semi-seated position the first few nights following surgery.  Keep a pillow propped under the elbow and forearm for comfort.  If you have a recliner type of chair it might be beneficial.  If not that is fine  too, but it would be helpful to sleep propped up with pillows behind your operated shoulder as well under your elbow and forearm.  This will reduce pulling on the suture lines.  When dressing, put your operative arm in the sleeve first.  When getting undressed, take your operative arm out last.  Loose fitting, button-down shirts are recommended.  In most states it is against the law to drive while your arm is in a sling. And certainly against the law to drive while taking narcotics.  You may return to work/school in the next couple of days when you feel up to it. Desk work and typing in the sling is fine.  We suggest you use the pain medication the first night prior to going to bed, in order to ease any pain when the anesthesia wears off. You should avoid taking pain medications on an empty stomach as it will make you nauseous.  Do not drink alcoholic beverages or take illicit drugs when taking pain medications.  Pain medication may make you constipated.  Below are a few solutions to try in this order: Decrease the amount of pain medication if you aren't having pain. Drink lots of decaffeinated fluids. Drink prune juice and/or each dried prunes  If the first 3 don't work start with additional solutions Take Colace - an over-the-counter stool softener Take Senokot - an over-the-counter laxative Take Miralax - a stronger over-the-counter laxative   Dental Antibiotics:  In most cases prophylactic antibiotics for Dental procdeures after total joint surgery are not necessary.  Exceptions are as follows:  1. History of prior total joint infection  2. Severely immunocompromised (Organ Transplant, cancer chemotherapy, Rheumatoid biologic meds such as [place])  3. Poorly controlled diabetes (A1C &gt; 8.0, blood glucose over 200)  If you have one of these conditions, contact your surgeon for an antibiotic prescription, prior to your dental procedure.   For more information including  helpful videos and documents visit our website:   [email or web address]

## 2020-07-30 NOTE — Op Note (Signed)
Orthopaedic Surgery Operative Note (CSN: [id number])  Sherry [name]  10-11-1950 Date of Surgery: 07/30/2020   Diagnoses:  Right irreparable rotator cuff tear  Procedure: Right reverse total Shoulder Arthroplasty   Operative Finding Successful completion of planned procedure.  Patient's anatomy was quite small which made the case slightly more difficult.  She had an irreparable rotator cuff tear but her cartilage is intact.  Axillary nerve tug test was normal at the beginning and end of the case.  Post-operative plan: The patient will be NWB in sling.  The patient will be will be discharged from PACU if continues to be stable as was plan prior to surgery.  DVT prophylaxis Aspirin 81 mg twice daily for 6 weeks.  Pain control with PRN pain medication preferring oral medicines.  Follow up plan will be scheduled in approximately 7 days for incision check and XR.  Physical therapy to start after first visit.  Implants: Size 1B short flex stem, 0 high offset tray with a 6 mm poly-, 36 standard glenosphere with a 35 center screw and 25 baseplate.  Post-Op Diagnosis: Same Surgeons:Primary: Hiram Gash, MD Assistants:Caroline McBane PA-C Location: [facility] ROOM 06 Anesthesia: General with Exparel Interscalene Antibiotics: Ancef 2g preop, Vancomycin 1000mg  locally Tourniquet time: None Estimated Blood Loss: 759 Complications: None Specimens: None Implants: Implant Name Type Inv. Item Serial No. Manufacturer Lot No. LRB No. Used Action  BASEPLATE GLENOSPHERE 16BW STD - [id number] Miscellaneous BASEPLATE GLENOSPHERE 17BL STD [id number] [employer]  Right 1 Implanted  BONE SCREW THREAD 6.5X35MM - [id number] Screw BONE SCREW THREAD 6.5X35MM  [employer]  Right 1 Implanted  SCREW 5.5X22 - [id number] Screw SCREW 5.5X22  [employer]  Right 1 Implanted  SCREW 5.0X18 - [id number] Screw SCREW 5.0X18  [employer]  Right 1 Implanted  GLENOSPHERE REV SHOULDER 36 - [id number] Joint GLENOSPHERE REV SHOULDER  36 [id number] [employer]  Right 1 Implanted  INSERT HUMERAL 36X6MM 12.5DEG - [id number] Insert INSERT HUMERAL 36X6MM 12.5DEG [id number] [employer]  Right 1 Implanted  STEM HUM AEQUALIS STD SZ1B 66 - [id number] Stem STEM Oren Binet STD SZ1B 66 [id number] [employer]  Right 1 Implanted  IMPLANT REVERSE SHOULDER 0X3.5 - [id number] Shoulder IMPLANT REVERSE SHOULDER 0X3.5 [id number] [employer]  Right 1 Implanted    Indications for Surgery:   Sherry [name] is a 70 y.o. female with irreparable rotator cuff tear with a work-related injury.  Benefits and risks of operative and nonoperative management were discussed prior to surgery with patient/guardian(s) and informed consent form was completed.  Infection and need for further surgery were discussed as was prosthetic stability and cuff issues.  We additionally specifically discussed risks of axillary nerve injury, infection, periprosthetic fracture, continued pain and longevity of implants prior to beginning procedure.      Procedure:   The patient was identified in the preoperative holding area where the surgical site was marked. Block placed by anesthesia with exparel.  The patient was taken to the OR where a procedural timeout was called and the above noted anesthesia was induced.  The patient was positioned beachchair on allen table with spider arm positioner.  Preoperative antibiotics were dosed.  The patient's right shoulder was prepped and draped in the usual sterile fashion.  A second preoperative timeout was called.       Standard deltopectoral approach was performed with a #10 blade. We dissected down to the subcutaneous tissues and the cephalic vein was taken laterally with the deltoid. Clavipectoral fascia was incised in  line with the incision. Deep retractors were placed. The long of the biceps tendon was identified and there was significant tenosynovitis present.  Tenodesis was performed to the pectoralis tendon with #2 Ethibond.  The remaining biceps was followed up into the rotator interval where it was released.   The subscapularis was taken down in a full thickness layer with capsule along the humeral neck extending inferiorly around the humeral head. We continued releasing the capsule directly off of the osteophytes inferiorly all the way around the corner. This allowed [place] to dislocate the humeral head.   The rotator cuff was carefully examined and noted to be irreperably torn.  The decision was confirmed that a reverse total shoulder was indicated for this patient.  There were osteophytes along the inferior humeral neck. The osteophytes were removed with an osteotome and a rongeur.  Osteophytes were removed with a rongeur and an osteotome and the anatomic neck was well visualized.     A humeral cutting guide was inserted down the intramedullary canal. The version was set at 20 of retroversion. Humeral osteotomy was performed with an oscillating saw. The head fragment was passed off the back table. A starter awl was used to open the humeral canal. We next used T-handle straight sound reamers to ream up to an appropriate fit. A chisel was used to remove proximal humeral bone. We then broached starting with a size one broach and broaching up to 1 which obtained an appropriate fit. The broach handle was removed. A cut protector was placed. The broach handle was removed and a cut protector was placed. The humerus was retracted posteriorly and we turned our attention to glenoid exposure.  The subscapularis was again identified and immediately we took care to palpate the axillary nerve anteriorly and verify its position with gentle palpation as well as the tug test.  We then released the SGHL with bovie cautery prior to placing a curved mayo at the junction of the anterior glenoid well above the axillary nerve and bluntly dissecting the subscapularis from the capsule.  We then carefully protected the axillary nerve as we gently  released the inferior capsule to fully mobilize the subscapularis.  An anterior deltoid retractor was then placed as well as a small Hohmann retractor superiorly.  The glenoid was relatively intact in the setting of an irreparable cuff tear  The remaining labrum was removed circumferentially taking great care not to disrupt the posterior capsule.   The glenoid drill guide was placed and used to drill a guide pin in the center, inferior position. The glenoid face was then reamed concentrically over the guide wire. The center hole was drilled over the guidepin in a near anatomic angle of version.  The vault was perforated minorly in the posterior aspect.  Next the glenoid vault was drilled back to a depth of 35 mm.  We tapped and then placed a 60mm size baseplate with additional 6mm lateralization was selected with a 6.5 mm x 35 mm length central screw.  The base plate was screwed into the glenoid vault obtaining secure fixation. We next placed superior and inferior locking screws for additional fixation.  Next a 36 mm glenosphere was selected and impacted onto the baseplate. The center screw was tightened.  We turned attention back to the humeral side. The cut protector was removed. We trialed with multiple size tray and polyethylene options and selected a 6 which provided good stability and range of motion without excess soft tissue tension. The offset was  dialed in to match the normal anatomy. The shoulder was trialed.  There was good ROM in all planes and the shoulder was stable with no inferior translation.  The real humeral implants were opened after again confirming sizes.  The trial was removed. #5 Fiberwire x4 sutures passed through the humeral neck for subscap repair. The humeral component was press-fit obtaining a secure fit. A +0 high offset tray was selected and impacted onto the stem.  A 36+6 polyethylene liner was impacted onto the stem.  The joint was reduced and thoroughly irrigated with  pulsatile lavage. Subscap was repaired back with #5 Fiberwire sutures through bone tunnels. Hemostasis was obtained. The deltopectoral interval was reapproximated with #1 Ethibond. The subcutaneous tissues were closed with 2-0 Vicryl and the skin was closed with running monocryl.    The wounds were cleaned and dried and an Aquacel dressing was placed. The drapes taken down. The arm was placed into sling with abduction pillow. Patient was awakened, extubated, and transferred to the recovery room in stable condition. There were no intraoperative complications. The sponge, needle, and attention counts were  correct at the end of the case.       Noemi Chapel, PA-C, present and scrubbed throughout the case, critical for completion in a timely fashion, and for retraction, instrumentation, closure.

## 2020-07-30 NOTE — Anesthesia Procedure Notes (Signed)
Anesthesia Regional Block: Interscalene brachial plexus block   Pre-Anesthetic Checklist: , timeout performed,  Correct Patient, Correct Site, Correct Laterality,  Correct Procedure, Correct Position, site marked,  Risks and benefits discussed,  Surgical consent,  Pre-op evaluation,  At surgeon's request and post-op pain management  Laterality: Right  Prep: chloraprep       Needles:  Injection technique: Single-shot  Needle Type: Echogenic Needle     Needle Length: 5cm  Needle Gauge: 21     Additional Needles:   Narrative:  Start time: 07/30/2020 7:15 AM End time: 07/30/2020 7:21 AM Injection made incrementally with aspirations every 5 mL.  Performed by: Personally  Anesthesiologist: Audry Pili, MD  Additional Notes: No pain on injection. No increased resistance to injection. Injection made in 5cc increments. Good needle visualization. Patient tolerated the procedure well.

## 2020-07-31 ENCOUNTER — Telehealth: Payer: Self-pay

## 2020-07-31 ENCOUNTER — Encounter (HOSPITAL_COMMUNITY): Payer: Self-pay | Admitting: Orthopaedic Surgery

## 2020-07-31 NOTE — Telephone Encounter (Signed)
Patient's insurance/health nurse called to clarify that patient is a diabetic and not currently on statin therapy. She advised the use of statin therapy is beneficial in decreasing cardiovascular disease. I advised nurse I would reach out to PCP to clarify or advise on start of therapy.

## 2020-08-01 ENCOUNTER — Encounter: Payer: Self-pay | Admitting: Family Medicine

## 2020-08-08 MED ORDER — SODIUM CHLORIDE (PF) 0.9 % IJ SOLN
INTRAMUSCULAR | Status: AC
  Filled 2020-08-08: qty 50

## 2020-08-12 ENCOUNTER — Telehealth: Payer: Self-pay | Admitting: Pharmacist

## 2020-08-12 NOTE — Chronic Care Management (AMB) (Signed)
Chronic Care Management Pharmacy Assistant   Name: Sherry [name]  MRN: [id number] DOB: 1950-02-20  Reason for Encounter: Disease State Diabetes   Recent office visits:  None noted  Recent consult visits:  None [place] [facility] visits:  None in previous 6 months  Medications: Outpatient Encounter Medications as of 08/12/2020  Medication Sig   acetaminophen (TYLENOL) 500 MG tablet Take 2 tablets (1,000 mg total) by mouth every 8 (eight) hours for 14 days.   albuterol (VENTOLIN HFA) 108 (90 Base) MCG/ACT inhaler Inhale 2 puffs into the lungs every 6 (six) hours as needed for wheezing or shortness of breath.   amoxicillin (AMOXIL) 500 MG capsule Take 2,000 mg by mouth See admin instructions. For dental procedures   blood glucose meter kit and supplies KIT Dispense based on patient and insurance preference. Use up to four times daily as directed. (FOR ICD-9 250.00, 250.01). Pt uses once a day   Blood Glucose Monitoring Suppl (ONE TOUCH ULTRA 2) w/Device KIT Use to check glucose up to twice daily. E11.9 dx code   cetirizine (ZYRTEC) 10 MG tablet Take 1 tablet (10 mg total) by mouth daily.   Cholecalciferol (VITAMIN D3) 50 MCG (2000 UT) CAPS Take 2,000 Units by mouth daily. In morning   clopidogrel (PLAVIX) 75 MG tablet Take 1 tablet (75 mg total) by mouth daily.   empagliflozin (JARDIANCE) 10 MG TABS tablet Take 1 tablet (10 mg total) by mouth daily. Take 0.5 tablets (5 mg total) daily. (Patient taking differently: Take 5 mg by mouth daily. Take 0.5 tablets (5 mg total) daily.)   famotidine (PEPCID) 20 MG tablet Take 20 mg by mouth daily.    fluticasone (FLOVENT HFA) 220 MCG/ACT inhaler Inhale 1 puff into the lungs 2 (two) times daily. May increase to 2 puffs if needed (Patient taking differently: Inhale 1 puff into the lungs 2 (two) times daily as needed (asthma). May increase to 2 puffs if needed)   furosemide (LASIX) 20 MG tablet Take 1 tablet (20 mg total) by mouth daily.    gabapentin (NEURONTIN) 300 MG capsule TAKE 2 CAPSULES BY MOUTH TWO TIMES DAILY (Patient taking differently: Take 600 mg by mouth in the morning and at bedtime. TAKE 2 CAPSULES BY MOUTH TWO TIMES DAILY)   glucose blood (ACCU-CHEK AVIVA PLUS) test strip Use as instructed   glucose blood (ONETOUCH ULTRA) test strip Use to check glucose up to twice daily. E11.9 dx code   ipratropium (ATROVENT) 0.03 % nasal spray Place 2 sprays into the nose 4 (four) times daily. (Patient taking differently: Place 1 spray into the nose every morning.)   Lancets Misc. (ACCU-CHEK SOFTCLIX LANCET DEV) KIT USE AS DIRECTED   linagliptin (TRADJENTA) 5 MG TABS tablet TAKE 1/2 TABLET BY MOUTH DAILY *NEED OFFICE VISIT* (Patient taking differently: Take 2.5 mg by mouth daily. TAKE 1/2 TABLET BY MOUTH DAILY *NEED OFFICE VISIT*)   lisinopril (ZESTRIL) 20 MG tablet Take 1 tablet (20 mg total) by mouth daily.   metFORMIN (GLUCOPHAGE) 500 MG tablet TAKE 2 TABLETS BY MOUTH TWO TIMES DAILY (Patient taking differently: Take 1,000 mg by mouth 2 (two) times daily with a meal. TAKE 2 TABLETS BY MOUTH TWO TIMES DAILY)   methocarbamol (ROBAXIN) 500 MG tablet Take 1 tablet (500 mg total) by mouth every 8 (eight) hours as needed for muscle spasms.   metoprolol tartrate (LOPRESSOR) 25 MG tablet Take 1 tablet (25 mg total) by mouth daily. (Patient taking differently: Take 12.5 mg by mouth  2 (two) times daily.)   Multiple Vitamins-Minerals (MULTIVITAMIN PO) Take 1 tablet by mouth daily.   OneTouch Delica Lancets 17W MISC Use to check glucose up to twice daily. E11.9 dx code   No facility-administered encounter medications on file as of 08/12/2020.    Recent Relevant Labs: Lab Results  Component Value Date/Time   HGBA1C 6.6 (H) 06/02/2020 09:33 AM   HGBA1C 6.2 04/16/2019 03:56 PM    Kidney Function Lab Results  Component Value Date/Time   CREATININE 0.71 07/24/2020 01:38 PM   CREATININE 0.82 06/02/2020 09:33 AM   CREATININE 0.86 02/09/2014  05:56 PM   CREATININE 0.66 06/13/2013 09:06 AM   GFR 72.99 06/02/2020 09:33 AM   GFRNONAA >60 07/24/2020 01:38 PM   GFRAA >60 09/04/2019 02:43 PM    Current antihyperglycemic regimen:  Tradjenta 50m 1/2 tab daily Jardiance 155m1/2 tab daily Metformin 50040m take 2 tablets = 1000m39mice daily  What recent interventions/DTPs have been made to improve glycemic control:  None noted  Have there been any recent hospitalizations or ED visits since last visit with CPP? No  Patient denies hypoglycemic symptoms, including Pale, Sweaty, Shaky, Hungry, Nervous/irritable, and Vision changes  Patient reports hyperglycemic symptoms, including  Feeling "flushed"  How often are you checking your blood sugar?       Every other day  What are your blood sugars ranging?  Fasting: 126 checked at hospital Before meals:  After meals:  Bedtime:   During the week, how often does your blood glucose drop below 70? Never  Are you checking your feet daily/regularly?  Patient states she checks her feet daily   Adherence Review: Is the patient currently on a STATIN medication? No Is the patient currently on ACE/ARB medication? Yes Does the patient have >5 day gap between last estimated fill dates? No     Star Rating Drugs:  Jardiance 10 MG last filled 08/01/20 30 DS Tradjenta 5 MG last filled 08/01/20 30 DS Lisinopril 20 MG last filled 08/01/20 30 DS Metformin 500 MG last filled 08/01/20 30 DS  Patient would like to enroll in the RPM program with [facility] to check blood pressure.  Patient states she her pcp recommended a cholesterol medication but she has been unable to tolerate statins in the past. Informed patient there are non statins that she may be able to tolerate better. Patient would like information about non statin medications.  Sherry [name] Pharmacist Assistant [phone]

## 2020-08-12 NOTE — Telephone Encounter (Signed)
Looks like Dr Lorelei Pont had send patient a message in [place] about starting statin.  Will forward to Dr Lorelei Pont to consider rosuvastatin 5mg  3 times per week.

## 2020-08-13 ENCOUNTER — Encounter: Payer: Self-pay | Admitting: Family Medicine

## 2020-08-13 ENCOUNTER — Other Ambulatory Visit: Payer: Self-pay | Admitting: Family Medicine

## 2020-08-13 DIAGNOSIS — E118 Type 2 diabetes mellitus with unspecified complications: Secondary | ICD-10-CM

## 2020-08-13 MED ORDER — ROSUVASTATIN CALCIUM 5 MG PO TABS: ORAL_TABLET | ORAL | 6 refills | Status: DC

## 2020-08-13 NOTE — Telephone Encounter (Signed)
Patient was notified about new Rx for rosuvastatin 5mg  3 times for week. Discussed side effects to monitor.  Patient also had questions about other medications.  She states she was prescribed Roxicet / oxycodone after shoulder surgery 07/30/20. She reports having itching and hallucinations. Updated allergy list.  She also states she looked at her discharge report and thought she saw that it was recommended that she take ASA 81mg  daily for 2 weeks after surgery. I reviewed discharge records and did not see this recommendation. She was concerned because she has not been taking aspirin 81mg . Today is 2 weeks post surgery. Instructed that she did not need to start Aspirin now since past 2 week mark. Continue to take clopidogrel 75mg  daily

## 2020-08-21 ENCOUNTER — Other Ambulatory Visit: Payer: Self-pay | Admitting: Family Medicine

## 2020-08-21 DIAGNOSIS — E118 Type 2 diabetes mellitus with unspecified complications: Secondary | ICD-10-CM

## 2020-09-15 ENCOUNTER — Other Ambulatory Visit: Payer: Self-pay | Admitting: Family Medicine

## 2020-09-15 DIAGNOSIS — I10 Essential (primary) hypertension: Secondary | ICD-10-CM

## 2020-09-15 DIAGNOSIS — J45901 Unspecified asthma with (acute) exacerbation: Secondary | ICD-10-CM

## 2020-09-17 ENCOUNTER — Encounter: Payer: Self-pay | Admitting: Family Medicine

## 2020-09-22 ENCOUNTER — Ambulatory Visit (INDEPENDENT_AMBULATORY_CARE_PROVIDER_SITE_OTHER): Payer: Medicare Other | Admitting: Pharmacist

## 2020-09-22 ENCOUNTER — Telehealth: Payer: Self-pay | Admitting: Pharmacist

## 2020-09-22 DIAGNOSIS — J45909 Unspecified asthma, uncomplicated: Secondary | ICD-10-CM

## 2020-09-22 DIAGNOSIS — E118 Type 2 diabetes mellitus with unspecified complications: Secondary | ICD-10-CM | POA: Diagnosis not present

## 2020-09-22 DIAGNOSIS — F320 Major depressive disorder, single episode, mild: Secondary | ICD-10-CM | POA: Diagnosis not present

## 2020-09-22 DIAGNOSIS — I10 Essential (primary) hypertension: Secondary | ICD-10-CM | POA: Diagnosis not present

## 2020-09-22 NOTE — Patient Instructions (Signed)
Visit Information  PATIENT GOALS:  Goals Addressed             This Visit's Progress    Chronic Care Management Pharmacy Care Plan   On track    Tensed (see longitudinal plan of care for additional care plan information)  Current Barriers:  Chronic Disease Management support, education, and care coordination needs related to Asthma, Diabetes, Hypertension, Hx of Heart Failure, Hyperlipidemia/Hx of TIA, Depression, Pain   Hypertension BP Readings from Last 3 Encounters:  07/30/20 (!) 148/82  07/24/20 (!) 145/63  06/02/20 126/68  Pharmacist Clinical Goal(s): Over the next 90 days, patient will work with PharmD and providers to maintain BP goal <140/90 Current regimen:  Lisinopril '20mg'$  daily Metoprolol '25mg'$  - take 0.5 tab = 12.'5mg'$  twice daily Interventions: Requested patient to check blood pressure 3 to 4 times per week and record Forwarded request for remote blood pressure monitoring program through [employer] Patient self care activities - Over the next 90 days, patient will: Check blood pressure 3 to 4 times per week, document, and provide at future appointments. I have forwarded request for remote blood pressure monitoring through [employer] daily salt intake < 2300 mg/day Report symptoms of low blood pressure like dizziness or if blood pressure is less than [id number] on top (systolic BP) or less than 60 on bottom (diastolic BP)  Hyperlipidemia / history of stroke Lipid Panel     Component Value Date/Time   CHOL 142 06/02/2020 0933   TRIG 96.0 06/02/2020 0933   HDL 62.10 06/02/2020 0933   CHOLHDL 2 06/02/2020 0933   VLDL 19.2 06/02/2020 0933   LDLCALC 61 06/02/2020 0933  Pharmacist Clinical Goal(s): Over the next 90 days, patient will work with PharmD and providers to maintain LDL goal < 70 Current regimen:  Diet and exercise management for cholesterol Clopidogrel '75mg'$  daily   Interventions: Updated allergy list to include intolerance to  rosuvastatin Patient self care activities - Over the next 90 days, patient will: Maintain LDL <70 Continue to take clopidogrel for stroke prevention  Diabetes Lab Results  Component Value Date/Time   HGBA1C 6.6 (H) 06/02/2020 09:33 AM   HGBA1C 6.2 04/16/2019 03:56 PM  Pharmacist Clinical Goal(s): Over the next 90 days, patient will work with PharmD and providers to maintain A1c goal <7% Current regimen:  Tradjenta '5mg'$  1/2 tab daily Jardiance '10mg'$  1/2 tab daily  Metformin '500mg'$  - take 2 tablets = '1000mg'$  twice daily Interventions: Reviewed how to identify and treatment for hypoglycemia (low blood sugar) Reviewed home blood glucose readings and reviewed goals  Patient self care activities - Over the next 90 days, patient will: Maintain diabetes medication regimen Continue to check blood glucose daily.  Fasting blood glucose goal (before meals) = 80 to 130 Blood glucose goal after a meal = less than 180  Contact office if experience increase in low blood sugar readings (<80)  Depression Pharmacist Clinical Goal(s) Over the next 90 days, patient will work with PharmD and providers to address depression symptoms and restart medication if needed.  Current regimen:  none Interventions: Patient is considering restarting one of her previous medications for depression. Recommend contact office prior to doing this  Patient self care activities - Over the next 90 days, patient will: Call office to discuss with Dr Lorelei Pont if you would like to restart sertraline '25mg'$   Low Serum Vitamin D: Pharmacist Clinical Goal(s) Over the next 90 days, patient will work with PharmD and providers to increase serum  vitamin D to normal range (30 to 100) Current regimen:  Vitamin D3 2000 units daily Patient self care activities - Over the next 90 days, patient will: Continue to take vitamin D daily (included in pill packs now)   Asthma:  Pharmacist Clinical Goal(s) Over the next 90 days, patient will  work with PharmD and providers to improve adherence to maintenance inhaler Current regimen:  Flovent 257mg inhaler - inhale 1 puff twice a day (may increase to 2 puff twice a day if needed)  Albuterol Inhaler - inhale 2 puffs every 6 hours as needed.  Interventions: Discussed difference between maintenance / Flovent inhaler and rescue / albuterol inhaler Encouraged patient to use Flovent every day during months she is exposed to allergens.  Patient self care activities - Over the next 90 days, patient will: Use Flovent  / maintenance inhaler every day   Medication management Pharmacist Clinical Goal(s): Over the next 90 days, patient will work with PharmD and providers to achieve optimal medication adherence Current pharmacy: [facility] Interventions Comprehensive medication review performed. Utilize [facility] pharmacy for medication synchronization, packaging and delivery Patient self care activities - Over the next 90 days, patient will: Focus on medication adherence by filling medications appropriately  Take medications as prescribed Report any questions or concerns to PharmD and/or provider(s)  Please see past updates related to this goal by clicking on the "Past Updates" button in the selected goal          Patient verbalizes understanding of instructions provided today and [place]   Telephone follow up appointment with care management team member scheduled for: 6 months with clinical pharmacist; Pharmacy assistant / CMA will check in with patient in 2 months regarding recent home blood pressure readings.   TCherre Robins PharmD Clinical Pharmacist [facility]

## 2020-09-22 NOTE — Chronic Care Management (AMB) (Signed)
    Chronic Care Management Pharmacy Assistant   Name: Sherry [name]  MRN: [id number] DOB: Sep 11, 1950  Reason for Encounter: Coordinating with outside vendors [facility])  Misc comments: [employer] was called on 09/22/20 to verify if patient was eligible for RPM. I was informed by a representative that the patients health care plan will cover RPM for her, that DME (durable medical equipment) will need to send over a request to start the process. I also was informed by [facility], that in some cases a prior authorization may be needed.      Medications: Outpatient Encounter Medications as of 09/22/2020  Medication Sig   albuterol (VENTOLIN HFA) 108 (90 Base) MCG/ACT inhaler Inhale 2 puffs into the lungs every 6 (six) hours as needed for wheezing or shortness of breath.   amoxicillin (AMOXIL) 500 MG capsule Take 2,000 mg by mouth See admin instructions. For dental procedures   Blood Glucose Monitoring Suppl (ONE TOUCH ULTRA 2) w/Device KIT Use to check glucose up to twice daily. E11.9 dx code   cetirizine (ZYRTEC) 10 MG tablet Take 1 tablet (10 mg total) by mouth daily.   Cholecalciferol (VITAMIN D3) 50 MCG (2000 UT) CAPS Take 2,000 Units by mouth daily. In morning   clopidogrel (PLAVIX) 75 MG tablet TAKE ONE TABLET BY MOUTH EVERY MORNING   empagliflozin (JARDIANCE) 10 MG TABS tablet Take 1 tablet (10 mg total) by mouth daily. Take 0.5 tablets (5 mg total) daily. (Patient taking differently: Take 5 mg by mouth daily. Take 0.5 tablets (5 mg total) daily.)   famotidine (PEPCID) 20 MG tablet Take 20 mg by mouth daily.    fluticasone (FLOVENT HFA) 220 MCG/ACT inhaler INHALE 1 PUFF BY MOUTH INTO LUNGS TWICE DAILY. MAY INCREASE TO TWO PUFFS IF needed   furosemide (LASIX) 20 MG tablet Take 1 tablet (20 mg total) by mouth daily.   gabapentin (NEURONTIN) 300 MG capsule TAKE TWO CAPSULES BY MOUTH EVERY MORNING and TAKE TWO CAPSULES BY MOUTH EVERYDAY AT BEDTIME   glucose blood (ONETOUCH ULTRA) test strip Use  to check glucose up to twice daily. E11.9 dx code   glucose blood (ONETOUCH VERIO) test strip 1 each by Other route daily. Use as instructed   ipratropium (ATROVENT) 0.03 % nasal spray Place 2 sprays into the nose 4 (four) times daily. (Patient taking differently: Place 1 spray into the nose every morning.)   linagliptin (TRADJENTA) 5 MG TABS tablet TAKE 1/2 TABLET BY MOUTH DAILY *NEED OFFICE VISIT* (Patient taking differently: Take 2.5 mg by mouth daily. TAKE 1/2 TABLET BY MOUTH DAILY *NEED OFFICE VISIT*)   lisinopril (ZESTRIL) 20 MG tablet Take 1 tablet (20 mg total) by mouth daily.   metFORMIN (GLUCOPHAGE) 500 MG tablet TAKE 2 TABLETS BY MOUTH TWO TIMES DAILY (Patient taking differently: Take 1,000 mg by mouth 2 (two) times daily with a meal. TAKE 2 TABLETS BY MOUTH TWO TIMES DAILY)   methocarbamol (ROBAXIN) 500 MG tablet Take 1 tablet (500 mg total) by mouth every 8 (eight) hours as needed for muscle spasms. (Patient not taking: Reported on 09/22/2020)   metoprolol tartrate (LOPRESSOR) 25 MG tablet Take 0.5 tablets (12.5 mg total) by mouth 2 (two) times daily.   Multiple Vitamins-Minerals (MULTIVITAMIN PO) Take 1 tablet by mouth daily.   OneTouch Delica Lancets 26S MISC Use to check glucose up to twice daily. E11.9 dx code   No facility-administered encounter medications on file as of 09/22/2020.    SIG Shontae Stokes, CMA (PTM)

## 2020-09-22 NOTE — Chronic Care Management (AMB) (Signed)
Chronic Care Management Pharmacy Note  09/22/2020 Name:  GUISELLE MIAN MRN:  [id number] DOB:  03/26/1950  Subjective: AUBRIAUNA RINER is an 70 y.o. year old female who is a primary patient of Copland, Gay Filler, MD.  The CCM team was consulted for assistance with disease management and care coordination needs.    Engaged with patient by telephone for follow up visit in response to provider referral for pharmacy case management and/or care coordination services.   Consent to Services:  The patient was given information about Chronic Care Management services, agreed to services, and gave verbal consent prior to initiation of services.  Please see initial visit note for detailed documentation.   Patient Care Team: Copland, Gay Filler, MD as PCP - General (Family Medicine) Debara Pickett Nadean Corwin, MD as PCP - Cardiology (Cardiology) [name], PharmD (Pharmacist)  Recent office visits: 06/03/2020 - PCP (Dr Lorelei Pont) - Pre-Op Evaluation for right reverse total shoulder repair; Labs checked; no medication changes.   Recent consult visits: 05/19/20 Cardiology Sande Rives E, PA-C. For pre-op evaluation. Per note: Hold Plavix for 5 days STOPPED Methocarbamol 500 mg 3 times daily  Hospital visits: 07/30/2020 - Reverse shoulder arthroplasty at [facility] by Dr Griffin Basil. START taking: acetaminophen (TYLENOL); methocarbamol (Robaxin); ondansetron (Zofran); oxyCODONE (Oxy IR/ROXICODONE)  Objective:  Lab Results  Component Value Date   CREATININE 0.71 07/24/2020   CREATININE 0.82 06/02/2020   CREATININE 0.81 09/04/2019    Lab Results  Component Value Date   HGBA1C 6.6 (H) 06/02/2020   Last diabetic Eye exam: No results found for: HMDIABEYEEXA  Last diabetic Foot exam: No results found for: HMDIABFOOTEX      Component Value Date/Time   CHOL 142 06/02/2020 0933   TRIG 96.0 06/02/2020 0933   HDL 62.10 06/02/2020 0933   CHOLHDL 2 06/02/2020 0933   VLDL 19.2 06/02/2020  0933   LDLCALC 61 06/02/2020 0933    Hepatic Function Latest Ref Rng & Units 06/02/2020 09/04/2019 04/16/2019  Total Protein 6.0 - 8.3 g/dL 6.8 6.6 6.4  Albumin 3.5 - 5.2 g/dL 4.0 3.6 4.1  AST 0 - 37 U/L 15 19 12   ALT 0 - 35 U/L 12 14 11   Alk Phosphatase 39 - 117 U/L 74 56 77  Total Bilirubin 0.2 - 1.2 mg/dL 0.7 0.5 0.6    Lab Results  Component Value Date/Time   TSH 1.06 06/02/2020 09:33 AM   TSH 1.320 05/16/2019 02:14 PM   FREET4 0.63 12/30/2015 02:38 PM   FREET4 0.74 01/13/2015 09:54 AM    CBC Latest Ref Rng & Units 07/24/2020 06/02/2020 09/04/2019  WBC 4.0 - 10.5 K/uL 6.7 6.1 5.8  Hemoglobin 12.0 - 15.0 g/dL 13.4 14.2 13.4  Hematocrit 36.0 - 46.0 % 43.3 43.0 42.8  Platelets 150 - 400 K/uL 280 289.0 196    Lab Results  Component Value Date/Time   VD25OH 25.37 (L) 06/02/2020 09:33 AM    Clinical ASCVD: Yes  The 10-year ASCVD risk score Mikey Bussing DC Jr., et al., 2013) is: 24.7%   Values used to calculate the score:     Age: 88 years     Sex: Female     Is Non-Hispanic African American: Yes     Diabetic: Yes     Tobacco smoker: No     Systolic Blood Pressure: 009 mmHg     Is BP treated: Yes     HDL Cholesterol: 62.1 mg/dL     Total Cholesterol: 142 mg/dL  Social History   Tobacco Use  Smoking Status Never  Smokeless Tobacco Never   BP Readings from Last 3 Encounters:  07/30/20 (!) 148/82  07/24/20 (!) 145/63  06/02/20 126/68   Pulse Readings from Last 3 Encounters:  07/30/20 74  07/24/20 70  06/02/20 61   Wt Readings from Last 3 Encounters:  07/30/20 182 lb (82.6 kg)  07/24/20 182 lb (82.6 kg)  06/02/20 185 lb (83.9 kg)    Assessment: Review of patient past medical history, allergies, medications, health status, including review of consultants reports, laboratory and other test data, was performed as part of comprehensive evaluation and provision of chronic care management services.   SDOH:  (Social Determinants of Health) assessments and interventions  performed:     CCM Care Plan  Allergies  Allergen Reactions   Influenza Vaccines Anaphylaxis   Influenza Virus Vaccine Anaphylaxis   Atorvastatin Other (See Comments)    Joint pain / myalgias   Demerol Other (See Comments)    SEIZURE   Nsaids Other (See Comments)    NAPROXEN, IBUPROFEN, SKELAXIN---  CAUSES PALPITATIONS   Pravastatin Other (See Comments)    Myalgias    Prednisone Other (See Comments)    Caused her glucose to go up to 500 and went to ED   Rosuvastatin Other (See Comments)    Joint stiffness and muscle pain   Roxicodone [Oxycodone] Itching and Other (See Comments)    Hallucinations and itching   Tramadol Other (See Comments)    Contraindicated with Victoza    Codeine Itching   Mango Flavor [Flavoring Agent] Itching    Medications Reviewed Today     Reviewed by [name], PharmD (Pharmacist) on 09/22/20 at 1019  Med List Status: <None>   Medication Order Taking? Sig Documenting Provider Last Dose Status Informant  albuterol (VENTOLIN HFA) 108 (90 Base) MCG/ACT inhaler [id number] Yes Inhale 2 puffs into the lungs every 6 (six) hours as needed for wheezing or shortness of breath. Copland, Gay Filler, MD Taking Active Self  amoxicillin (AMOXIL) 500 MG capsule [id number] Yes Take 2,000 mg by mouth See admin instructions. For dental procedures [provider] Taking Active Self  Blood Glucose Monitoring Suppl (ONE TOUCH ULTRA 2) w/Device KIT [id number] Yes Use to check glucose up to twice daily. E11.9 dx code Copland, Gay Filler, MD Taking Active Self  cetirizine (ZYRTEC) 10 MG tablet [id number] Yes Take 1 tablet (10 mg total) by mouth daily. Copland, Gay Filler, MD Taking Active Self  Cholecalciferol (VITAMIN D3) 50 MCG (2000 UT) CAPS [id number] Yes Take 2,000 Units by mouth daily. In morning [provider] Taking Active Self  clopidogrel (PLAVIX) 75 MG tablet [id number] Yes TAKE ONE TABLET BY MOUTH EVERY MORNING Copland, Gay Filler, MD Taking Active    empagliflozin (JARDIANCE) 10 MG TABS tablet [id number] Yes Take 1 tablet (10 mg total) by mouth daily. Take 0.5 tablets (5 mg total) daily.  Patient taking differently: Take 5 mg by mouth daily. Take 0.5 tablets (5 mg total) daily.   Copland, Gay Filler, MD Taking Active Self  famotidine (PEPCID) 20 MG tablet [id number] Yes Take 20 mg by mouth daily.  [provider] Taking Active Self  fluticasone (FLOVENT HFA) 220 MCG/ACT inhaler [id number] Yes INHALE 1 PUFF BY MOUTH INTO LUNGS TWICE DAILY. MAY INCREASE TO TWO PUFFS IF needed Copland, Gay Filler, MD Taking Active   furosemide (LASIX) 20 MG tablet [id number] Yes Take 1 tablet (20 mg total) by mouth daily. Copland, Gay Filler, MD Taking  Active Self  gabapentin (NEURONTIN) 300 MG capsule [id number] Yes TAKE TWO CAPSULES BY MOUTH EVERY MORNING and TAKE TWO CAPSULES BY MOUTH EVERYDAY AT BEDTIME Copland, Gay Filler, MD Taking Active   glucose blood (ONETOUCH ULTRA) test strip [id number] Yes Use to check glucose up to twice daily. E11.9 dx code Copland, Gay Filler, MD Taking Active Self  glucose blood (ONETOUCH VERIO) test strip [id number] Yes 1 each by Other route daily. Use as instructed [provider] Taking Active   ipratropium (ATROVENT) 0.03 % nasal spray [id number] Yes Place 2 sprays into the nose 4 (four) times daily.  Patient taking differently: Place 1 spray into the nose every morning.   Copland, Gay Filler, MD Taking Active Self  linagliptin (TRADJENTA) 5 MG TABS tablet [id number] Yes TAKE 1/2 TABLET BY MOUTH DAILY *NEED OFFICE VISIT*  Patient taking differently: Take 2.5 mg by mouth daily. TAKE 1/2 TABLET BY MOUTH DAILY *NEED OFFICE VISIT*   Copland, Gay Filler, MD Taking Active Self  lisinopril (ZESTRIL) 20 MG tablet [id number] Yes Take 1 tablet (20 mg total) by mouth daily. Copland, Gay Filler, MD Taking Active Self  metFORMIN (GLUCOPHAGE) 500 MG tablet [id number] Yes TAKE 2 TABLETS BY MOUTH TWO TIMES DAILY  Patient taking differently:  Take 1,000 mg by mouth 2 (two) times daily with a meal. TAKE 2 TABLETS BY MOUTH TWO TIMES DAILY   Copland, Gay Filler, MD Taking Active Self  methocarbamol (ROBAXIN) 500 MG tablet [id number] No Take 1 tablet (500 mg total) by mouth every 8 (eight) hours as needed for muscle spasms.  Patient not taking: Reported on 09/22/2020   Ethelda Chick, PA-C Not Taking Active   metoprolol tartrate (LOPRESSOR) 25 MG tablet [id number] Yes Take 0.5 tablets (12.5 mg total) by mouth 2 (two) times daily. Copland, Gay Filler, MD Taking Active   Multiple Vitamins-Minerals (MULTIVITAMIN PO) [id number] Yes Take 1 tablet by mouth daily. [provider] Taking Active Self  OneTouch Delica Lancets 72Q MISC [id number] Yes Use to check glucose up to twice daily. E11.9 dx code Copland, Gay Filler, MD Taking Active Self            Patient Active Problem List   Diagnosis Date Noted   Traumatic hematoma of left lower leg 09/26/2018   S/P total knee arthroplasty, left 03/29/2017   Spondylosis without myelopathy or radiculopathy, cervical region 03/29/2017   Bilateral carpal tunnel syndrome 03/29/2017   Acute asthma exacerbation 08/19/2016   Cervicalgia 03/23/2016   Vertigo 04/14/2015   Anxiety state 01/22/2015   HLD (hyperlipidemia) 01/22/2015   Type 2 diabetes mellitus with circulatory disorder ([place]) 01/22/2015   Intracranial vascular stenosis 01/22/2015   Aneurysm ([place])    Headache    Essential hypertension 11/12/2014   Dependent edema 11/12/2014   Peripheral neuropathy 11/12/2014   Depression 11/12/2014   Facial droop    TIA (transient ischemic attack) 11/11/2014   Hyperthyroidism 08/06/2014   Meniere's disease 06/07/2014   Goiter 06/13/2013   Obesity, unspecified 06/13/2013    Immunization History  Administered Date(s) Administered   PPD Test 06/29/2013, 02/18/2015, 04/25/2018   Pneumococcal Conjugate-13 12/06/2016   Pneumococcal Polysaccharide-23 11/21/2009, 02/15/2018    Conditions to be  addressed/monitored: CHF, CAD, HTN, HLD, DMII, Depression and asthma; neuropathy; low serum vitamin D  Care Plan : General Pharmacy (Adult)  Updates made by [name], PHARMD since 09/22/2020 12:00 AM     Problem: type 2 DM; HTN; h/o stroke; CAD; asthma   Priority: High  Onset Date: 06/26/2020  Note:   Current Barriers:  Chronic Disease Management support, education, and care coordination needs related to Asthma, Diabetes, Hypertension, Heart Failure, Secondary prevention due to history of TIA, Depression, Pain  Pharmacist Clinical Goal(s):  Over the next 180 days, patient will maintain control of type 2 DM and HTN as evidenced by A1c < 7.0 and BP < 140/90  adhere to prescribed medication regimen as evidenced by fill history  through collaboration with PharmD and provider.   Interventions: 1:1 collaboration with Copland, Gay Filler, MD regarding development and update of comprehensive plan of care as evidenced by provider attestation and co-signature Inter-disciplinary care team collaboration (see longitudinal plan of care) Comprehensive medication review performed; medication list updated in electronic medical record  Hypertension / CHF BP Readings from Last 3 Encounters:  06/02/20 126/68  05/29/20 (!) 142/84  05/19/20 120/72  Variable but mostly controlled per recent office BP reading. BP goal <140/90 Current regimen:  Lisinopril 20mg  daily Metoprolol tartrate 25mg  - take 0.5 tab = 12.5mg  twice daily Furosemide 20mg  with breakfast Patient has not been checking BP at home recently states that her BP monitor is broken She would like to be considered for [employer]'s remote BP monitoring program.  Denies edema in legs Interventions: Requesting information for patient regarding remote BP monitoring through [facility]. Will plan to enroll her in program if she meets qualifications Continue current medications for heart / blood pressure  Hyperlipidemia  / history of TIA LDL goal is <70 - patient is at goal without pharmacotherapy Current regimen:  Diet and exercise management for cholesterol Clopidogrel 75mg  daily   Statins tried: rosuvastatin 5mg  3 times per week - caused stiffness and muscle pain which patient felt lead to fall; atorvastatin and pravastatin - myalgias.  Interventions: Continue to limit high fat / cholesterol foods Plan to restart exercise after physical therapy and cleared by ortho (recent shoulder surgery) - goal is to work up to at least 150 minutes of exercise per week as able.  Continue to take clopidogrel for stroke prevention  Diabetes Controlled; last A1c at goal of < 7.0% Checking BG at home 2 to 3 times per week.  BG today after breakfast was 145 Ranges from 90-190.  Current regimen:  Tradjenta 5mg  1/2 tab daily Jardiance 10mg  1/2 tab daily  Metformin 500mg  - take 2 tablets = 1000mg  twice daily Interventions: Given that she has CHF, would like to have her on full 10mg  of Jardiance or 25mg  however with current low HR and BP, would be cautious of pushing dose. Will continue to monitor.  Maintain current diabetes medication regimen Contact office if experience increase in low blood sugar readings (<80) Reviewed home blood glucose readings and reviewed goals  Fasting blood glucose goal (before meals) = 80 to 130 Blood glucose goal after a meal = less than 180    Depression Patient's grandson was murdered / shot in January 2022. She also lost a son about 20 years ago to gun violence.  She has seen grief counselor in past but is not interested in going back.  She states she does belong to a [place] and this has been helpful. She has taken sertraline 25mg  daily and quetiapine 25mg  daily in past States she stopped because she did not feel she needed and she was having tardive dyskinesia symptoms Current regimen:  none Interventions: (addressed at previous vists) Patient is considering  restarting one of her previous medications for depression. Recommend contact office  prior to doing this (would recommend trial of sertraline 49m daily since she felt that she had tardive dyskinesia symptoms last time which were most likely caused by quetiapine)    Low Serum Vitamin D: Last serum vitamin D was 25.37 (06/02/2020) Current regimen:  Vitamin D3 2000 units daily - started after labs from 06/02/2020 Interventions: Coordinated with pharmacy to have vitamin D3 added to her pill packages. (Completed)   Asthma:  Rarely uses albuterol inhaler Still uses Flovent as needed  No recent exacerbations Current regimen:  Flovent 2246m inhaler - inhale 1 puff twice a day (may increase to 2 puff twice a day if needed)  Albuterol Inhaler - inhale 2 puffs every 6 hours as needed.  Interventions: Discussed difference between maintenance / Flovent inhaler and rescue / albuterol inhaler Encouraged patient to use Flovent every day at least during months she is exposed to allergens.    Medication management Current pharmacy: [facility] Interventions Comprehensive medication review performed. Utilize [facility] pharmacy for medication synchronization, packaging and delivery  Patient Goals/Self-Care Activities Over the next 90 days, patient will:  take medications as prescribed, check glucose 2 to 3 times per week, document, and provide at future appointments, check blood pressure 2 to 3 times per week, document, and provide at future appointments, weigh daily, and contact provider if weight gain of 3lbs in 24 hours or 5lb in 1 week, and increase water intake  Follow Up Plan: Telephone follow up appointment with care management team member scheduled for:  CMA will check in with patient in 2 months to check BP; Clinical pharmacist will check in with patient in 6 months unless needed sooner.           Medication Assistance: None required.  Patient affirms current coverage meets needs.  Patient's  preferred pharmacy is:  [facility] [place] [address] [id number]: [phone]: [phone] pill box? Yes - med packaged by [facility] Pt endorses 99% compliance  Follow Up:  Patient agrees to Care Plan and Follow-up.   Telephone follow up appointment with care management team member scheduled for:  2 months - pharmacy CMA will check BP and BG; pharmacist follow up in 6 months unless needed. Sooner.   TaCherre RobinsPharmD Clinical Pharmacist [facility]

## 2020-09-23 ENCOUNTER — Other Ambulatory Visit: Payer: Self-pay | Admitting: Family Medicine

## 2020-09-23 DIAGNOSIS — I10 Essential (primary) hypertension: Secondary | ICD-10-CM

## 2020-10-09 DIAGNOSIS — E119 Type 2 diabetes mellitus without complications: Secondary | ICD-10-CM | POA: Diagnosis not present

## 2020-10-20 ENCOUNTER — Other Ambulatory Visit: Payer: Self-pay | Admitting: Family Medicine

## 2020-10-20 DIAGNOSIS — E118 Type 2 diabetes mellitus with unspecified complications: Secondary | ICD-10-CM

## 2020-10-20 DIAGNOSIS — R0982 Postnasal drip: Secondary | ICD-10-CM

## 2020-10-21 ENCOUNTER — Telehealth: Payer: Self-pay | Admitting: Family Medicine

## 2020-10-21 DIAGNOSIS — E118 Type 2 diabetes mellitus with unspecified complications: Secondary | ICD-10-CM

## 2020-10-21 NOTE — Telephone Encounter (Signed)
Hailey from [facility] called and stated they need clarification on the prescription below. It list two different instructions.  Contact #: [phone] Medication:empagliflozin (JARDIANCE) 10 MG TABS tablet

## 2020-10-22 MED ORDER — EMPAGLIFLOZIN 10 MG PO TABS: 10.0000 mg | ORAL_TABLET | Freq: Every day | ORAL | 3 refills | Status: DC

## 2020-10-22 NOTE — Telephone Encounter (Signed)
Sent new rx to clarify

## 2020-10-22 NOTE — Telephone Encounter (Signed)
Please clarify which sig is correct for patient. Will call pharmacy.

## 2020-10-28 DIAGNOSIS — H40013 Open angle with borderline findings, low risk, bilateral: Secondary | ICD-10-CM | POA: Diagnosis not present

## 2020-11-16 ENCOUNTER — Emergency Department (HOSPITAL_COMMUNITY): Payer: Medicare Other

## 2020-11-16 ENCOUNTER — Other Ambulatory Visit: Payer: Self-pay

## 2020-11-16 ENCOUNTER — Emergency Department (HOSPITAL_COMMUNITY)
Admission: EM | Admit: 2020-11-16 | Discharge: 2020-11-17 | Disposition: A | Discharge status: Home / Self Care | Payer: Medicare Other | Attending: Emergency Medicine | Admitting: Emergency Medicine

## 2020-11-16 ENCOUNTER — Encounter (HOSPITAL_COMMUNITY): Payer: Self-pay | Admitting: Emergency Medicine

## 2020-11-16 DIAGNOSIS — Z7951 Long term (current) use of inhaled steroids: Secondary | ICD-10-CM | POA: Insufficient documentation

## 2020-11-16 DIAGNOSIS — S0003XA Contusion of scalp, initial encounter: Secondary | ICD-10-CM | POA: Insufficient documentation

## 2020-11-16 DIAGNOSIS — S0990XA Unspecified injury of head, initial encounter: Secondary | ICD-10-CM | POA: Diagnosis not present

## 2020-11-16 DIAGNOSIS — Z7984 Long term (current) use of oral hypoglycemic drugs: Secondary | ICD-10-CM | POA: Diagnosis not present

## 2020-11-16 DIAGNOSIS — Z7902 Long term (current) use of antithrombotics/antiplatelets: Secondary | ICD-10-CM | POA: Insufficient documentation

## 2020-11-16 DIAGNOSIS — M25511 Pain in right shoulder: Secondary | ICD-10-CM | POA: Insufficient documentation

## 2020-11-16 DIAGNOSIS — I509 Heart failure, unspecified: Secondary | ICD-10-CM | POA: Diagnosis not present

## 2020-11-16 DIAGNOSIS — I110 Hypertensive heart disease with heart failure: Secondary | ICD-10-CM | POA: Insufficient documentation

## 2020-11-16 DIAGNOSIS — E119 Type 2 diabetes mellitus without complications: Secondary | ICD-10-CM | POA: Diagnosis not present

## 2020-11-16 DIAGNOSIS — Z96653 Presence of artificial knee joint, bilateral: Secondary | ICD-10-CM | POA: Insufficient documentation

## 2020-11-16 DIAGNOSIS — J45909 Unspecified asthma, uncomplicated: Secondary | ICD-10-CM | POA: Diagnosis not present

## 2020-11-16 DIAGNOSIS — R600 Localized edema: Secondary | ICD-10-CM | POA: Insufficient documentation

## 2020-11-16 DIAGNOSIS — Z79899 Other long term (current) drug therapy: Secondary | ICD-10-CM | POA: Diagnosis not present

## 2020-11-16 MED ORDER — ACETAMINOPHEN 500 MG PO TABS
1000.0000 mg | ORAL_TABLET | Freq: Once | ORAL | Status: AC
  Administered 2020-11-17: 1000 mg via ORAL
  Filled 2020-11-16: qty 2
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
## 2020-11-21 ENCOUNTER — Telehealth: Payer: Self-pay | Admitting: Pharmacist
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
## 2020-11-28 ENCOUNTER — Ambulatory Visit (INDEPENDENT_AMBULATORY_CARE_PROVIDER_SITE_OTHER): Payer: Medicare Other | Admitting: Pharmacist

## 2020-11-28 ENCOUNTER — Telehealth: Payer: Self-pay | Admitting: *Deleted

## 2020-11-28 DIAGNOSIS — F320 Major depressive disorder, single episode, mild: Secondary | ICD-10-CM

## 2020-11-28 DIAGNOSIS — E118 Type 2 diabetes mellitus with unspecified complications: Secondary | ICD-10-CM

## 2020-11-28 DIAGNOSIS — G459 Transient cerebral ischemic attack, unspecified: Secondary | ICD-10-CM

## 2020-11-28 DIAGNOSIS — J45909 Unspecified asthma, uncomplicated: Secondary | ICD-10-CM

## 2020-11-28 DIAGNOSIS — I10 Essential (primary) hypertension: Secondary | ICD-10-CM

## 2020-11-28 NOTE — Chronic Care Management (AMB) (Signed)
  Chronic Care Management   Note  11/28/2020 Name: [name] MRN: [id number] DOB: Feb 09, 1950  Steva Ready BRIGETTE HOPFER is a 70 y.o. year old female who is a primary care patient of Copland, Gay Filler, MD. CHERRILL SCRIMA is currently enrolled in care management services. An additional referral for Licensed Clinical SW was placed.   Follow up plan: Telephone appointment with care management team member scheduled for: 12/03/2020  Julian Hy, [place] Management  Direct Dial: [phone]
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
## 2020-11-28 NOTE — Patient Instructions (Signed)
Visit Information  PATIENT GOALS:  Goals Addressed             This Visit's Progress    Chronic Care Management Pharmacy Care Plan   On track    Sherry [name] (see longitudinal plan of care for additional care plan information)  Current Barriers:  Chronic Disease Management support, education, and care coordination needs related to Asthma, Diabetes, Hypertension, Hx of Heart Failure, Hyperlipidemia/Hx of TIA, Depression, Pain   Hypertension BP Readings from Last 3 Encounters:  11/17/20 (!) 145/89  07/30/20 (!) 148/82  07/24/20 (!) 145/63  Pharmacist Clinical Goal(s): Over the next 90 days, patient will work with PharmD and providers to maintain BP goal <140/90 Current regimen:  Lisinopril 20mg  daily Metoprolol 25mg  - take 0.5 tab = 12.5mg  twice daily Furosemide 20mg  each morning Interventions: Requested patient to check blood pressure 3 to 4 times per week and record Forwarded request for remote blood pressure monitoring program through [employer] Patient self care activities - Over the next 90 days, patient will: Check blood pressure 3 to 4 times per week, document, and provide at future appointments. I have forwarded request for remote blood pressure monitoring through [employer] daily salt intake < 2300 mg/day Report symptoms of low blood pressure like dizziness or if blood pressure is less than 841 on top (systolic BP) or less than 60 on bottom (diastolic BP)  Hyperlipidemia / history of stroke Lipid Panel     Component Value Date/Time   CHOL 142 06/02/2020 0933   TRIG 96.0 06/02/2020 0933   HDL 62.10 06/02/2020 0933   CHOLHDL 2 06/02/2020 0933   VLDL 19.2 06/02/2020 0933   LDLCALC 61 06/02/2020 0933  Pharmacist Clinical Goal(s): Over the next 90 days, patient will work with PharmD and providers to maintain LDL goal < 70 Current regimen:  Diet and exercise management for cholesterol Clopidogrel 75mg  daily   Interventions: Updated allergy  list to include intolerance to rosuvastatin Patient self care activities - Over the next 90 days, patient will: Maintain LDL <70 Continue to take clopidogrel for stroke prevention  Diabetes Lab Results  Component Value Date/Time   HGBA1C 6.6 (H) 06/02/2020 09:33 AM   HGBA1C 6.2 04/16/2019 03:56 PM  Pharmacist Clinical Goal(s): Over the next 90 days, patient will work with PharmD and providers to maintain A1c goal <7% Current regimen:  Tradjenta 5mg  1/2 tab daily Jardiance 10mg  1/2 tab daily  Metformin 500mg  - take 2 tablets = 1000mg  twice daily Interventions: Reviewed how to identify and treat hypoglycemia (low blood sugar) Reviewed home blood glucose readings and reviewed goals  Patient self care activities - Over the next 90 days, patient will: Maintain diabetes medication regimen Continue to check blood glucose daily.  Fasting blood glucose goal (before meals) = 80 to 130 Blood glucose goal after a meal = less than 180  Contact office if experience increase in low blood sugar readings (<80)  Depression Pharmacist Clinical Goal(s) Over the next 90 days, patient will work with PharmD and providers to address depression symptoms and restart medication if needed.  Current regimen:  none Interventions: Referral to [facility] social worker Patient self care activities - Over the next 90 days, patient will: Call office to discuss with Dr Lorelei Pont if you would like to restart sertraline 25mg   Low Serum Vitamin D: Pharmacist Clinical Goal(s) Over the next 90 days, patient will work with PharmD and providers to increase serum vitamin D to normal range (30 to 100) Current regimen:  Vitamin D3 2000 units daily Patient self care activities - Over the next 90 days, patient will: Continue to take vitamin D daily (included in pill packs now)   Asthma:  Pharmacist Clinical Goal(s) Over the next 90 days, patient will work with PharmD and providers to improve adherence to maintenance  inhaler Current regimen:  Flovent 253mcg inhaler - inhale 1 puff twice a day (may increase to 2 puff twice a day if needed)  Albuterol Inhaler - inhale 2 puffs every 6 hours as needed.  Interventions: Discussed difference between maintenance / Flovent inhaler and rescue / albuterol inhaler Encouraged patient to use Flovent every day during months she is exposed to allergens.  Patient self care activities - Over the next 90 days, patient will: Use Flovent  / maintenance inhaler every day   Medication management Pharmacist Clinical Goal(s): Over the next 90 days, patient will work with PharmD and providers to achieve optimal medication adherence Current pharmacy: [facility] Comprehensive medication review performed. Utilize [facility] pharmacy for medication synchronization, packaging and delivery Patient self care activities - Over the next 90 days, patient will: Focus on medication adherence by filling medications appropriately  Take medications as prescribed Report any questions or concerns to PharmD and/or provider(s)  Please see past updates related to this goal by clicking on the "Past Updates" button in the selected goal          The patient verbalized understanding of instructions, educational materials, and care plan provided today and declined offer to receive copy of patient instructions, educational materials, and care plan.   Telephone follow up appointment with care management team member scheduled for: Referred to Biomedical scientist; Follow up with Chronic Care Management pharmacist in 2 to 3 months  [name], PharmD Clinical Pharmacist [place] [facility]

## 2020-12-03 ENCOUNTER — Ambulatory Visit: Payer: Medicare Other | Admitting: *Deleted

## 2020-12-03 DIAGNOSIS — E118 Type 2 diabetes mellitus with unspecified complications: Secondary | ICD-10-CM

## 2020-12-03 DIAGNOSIS — I10 Essential (primary) hypertension: Secondary | ICD-10-CM

## 2020-12-03 DIAGNOSIS — R609 Edema, unspecified: Secondary | ICD-10-CM

## 2020-12-03 DIAGNOSIS — I729 Aneurysm of unspecified site: Secondary | ICD-10-CM

## 2020-12-03 DIAGNOSIS — F411 Generalized anxiety disorder: Secondary | ICD-10-CM

## 2020-12-03 DIAGNOSIS — F320 Major depressive disorder, single episode, mild: Secondary | ICD-10-CM

## 2020-12-03 DIAGNOSIS — G459 Transient cerebral ischemic attack, unspecified: Secondary | ICD-10-CM

## 2020-12-03 DIAGNOSIS — I679 Cerebrovascular disease, unspecified: Secondary | ICD-10-CM

## 2020-12-03 NOTE — Chronic Care Management (AMB) (Signed)
Chronic Care Management    Clinical Social Work Note  12/03/2020 Name: Sherry [name] MRN: [id number] DOB: August 09, 1950  Sherry [name] is a 70 y.o. year old female who is a primary care patient of Copland, Gay Filler, MD. The CCM team was consulted to assist the patient with chronic disease management and/or care coordination needs related to: Holiday representative and Mental Health Counseling and Resources.   Engaged with patient by telephone for initial visit in response to provider referral for social work chronic care management and care coordination services.   Consent to Services:  The patient was given information about Chronic Care Management services, agreed to services, and gave verbal consent prior to initiation of services.  Please see initial visit note for detailed documentation.   Patient agreed to services and consent obtained.   Assessment: Review of patient past medical history, allergies, medications, and health status, including review of relevant consultants reports was performed today as part of a comprehensive evaluation and provision of chronic care management and care coordination services.     SDOH (Social Determinants of Health) assessments and interventions performed:  SDOH Interventions    Flowsheet Row Most Recent Value  SDOH Interventions   Food Insecurity Interventions Intervention Not Indicated  Financial Strain Interventions Intervention Not Indicated  Housing Interventions Intervention Not Indicated  Intimate Partner Violence Interventions Intervention Not Indicated  Physical Activity Interventions Intervention Not Indicated, Patient Refused  Stress Interventions Offered [employer] Resources, Provide Counseling  Social Connections Interventions Intervention Not Indicated  Transportation Interventions Intervention Not Indicated        Advanced Directives Status: See Care Plan for related entries.  CCM Care Plan  Allergies   Allergen Reactions   Influenza Vaccines Anaphylaxis   Influenza Virus Vaccine Anaphylaxis   Atorvastatin Other (See Comments)    Joint pain / myalgias   Demerol Other (See Comments)    SEIZURE   Nsaids Other (See Comments)    NAPROXEN, IBUPROFEN, SKELAXIN---  CAUSES PALPITATIONS   Pravastatin Other (See Comments)    Myalgias    Prednisone Other (See Comments)    Caused her glucose to go up to 500 and went to ED   Rosuvastatin Other (See Comments)    Joint stiffness and muscle pain   Roxicodone [Oxycodone] Itching and Other (See Comments)    Hallucinations and itching   Tramadol Other (See Comments)    Contraindicated with Victoza    Codeine Itching   Mango Flavor [Flavoring Agent] Itching    Outpatient Encounter Medications as of 12/03/2020  Medication Sig   albuterol (VENTOLIN HFA) 108 (90 Base) MCG/ACT inhaler Inhale 2 puffs into the lungs every 6 (six) hours as needed for wheezing or shortness of breath.   Blood Glucose Monitoring Suppl (ONE TOUCH ULTRA 2) w/Device KIT Use to check glucose up to twice daily. E11.9 dx code   cetirizine (ZYRTEC) 10 MG tablet Take 1 tablet (10 mg total) by mouth daily.   Cholecalciferol (VITAMIN D3) 50 MCG (2000 UT) CAPS Take 2,000 Units by mouth daily. In morning   clopidogrel (PLAVIX) 75 MG tablet TAKE ONE TABLET BY MOUTH EVERY MORNING   empagliflozin (JARDIANCE) 10 MG TABS tablet Take 1 tablet (10 mg total) by mouth daily.   famotidine (PEPCID) 20 MG tablet Take 20 mg by mouth daily.    fluticasone (FLOVENT HFA) 220 MCG/ACT inhaler INHALE 1 PUFF BY MOUTH INTO LUNGS TWICE DAILY. MAY INCREASE TO TWO PUFFS IF needed   furosemide (LASIX) 20 MG tablet  TAKE ONE TABLET BY MOUTH EVERY MORNING   gabapentin (NEURONTIN) 300 MG capsule TAKE TWO CAPSULES BY MOUTH EVERY MORNING and TAKE TWO CAPSULES BY MOUTH EVERYDAY AT BEDTIME   glucose blood (ONETOUCH ULTRA) test strip Use to check glucose up to twice daily. E11.9 dx code   glucose blood (ONETOUCH VERIO)  test strip 1 each by Other route daily. Use as instructed   ipratropium (ATROVENT) 0.03 % nasal spray Place 1 spray into the nose every morning.   linagliptin (TRADJENTA) 5 MG TABS tablet Take 0.5 tablets (2.5 mg total) by mouth daily. TAKE 1/2 TABLET BY MOUTH DAILY *NEED OFFICE VISIT*   lisinopril (ZESTRIL) 20 MG tablet TAKE ONE TABLET BY MOUTH EVERY MORNING   metFORMIN (GLUCOPHAGE) 500 MG tablet TAKE TWO TABLETS BY MOUTH EVERY MORNING and TAKE TWO TABLETS BY MOUTH EVERY EVENING   metoprolol tartrate (LOPRESSOR) 25 MG tablet Take 0.5 tablets (12.5 mg total) by mouth 2 (two) times daily.   Multiple Vitamins-Minerals (MULTIVITAMIN PO) Take 1 tablet by mouth daily.   OneTouch Delica Lancets 75T MISC Use to check glucose up to twice daily. E11.9 dx code   No facility-administered encounter medications on file as of 12/03/2020.    Patient Active Problem List   Diagnosis Date Noted   Traumatic hematoma of left lower leg 09/26/2018   S/P total knee arthroplasty, left 03/29/2017   Spondylosis without myelopathy or radiculopathy, cervical region 03/29/2017   Bilateral carpal tunnel syndrome 03/29/2017   Acute asthma exacerbation 08/19/2016   Cervicalgia 03/23/2016   Vertigo 04/14/2015   Anxiety state 01/22/2015   HLD (hyperlipidemia) 01/22/2015   Type 2 diabetes mellitus with circulatory disorder ([place]) 01/22/2015   Intracranial vascular stenosis 01/22/2015   Aneurysm ([place])    Headache    Essential hypertension 11/12/2014   Dependent edema 11/12/2014   Peripheral neuropathy 11/12/2014   Depression 11/12/2014   Facial droop    TIA (transient ischemic attack) 11/11/2014   Hyperthyroidism 08/06/2014   Meniere's disease 06/07/2014   Goiter 06/13/2013   Obesity, unspecified 06/13/2013    Conditions to be addressed/monitored: Anxiety and Depression.  Limited Social Support, Mental Health Concerns, Family and Relationship Dysfunction, Social Isolation, and Lacks Knowledge of [employer].  Care Plan : LCSW Plan of Care  Updates made by Sherry Gaines, LCSW since 12/03/2020 12:00 AM     Problem: Reduce and Manage My Symptoms of Anxiety and Depression.   Priority: High     Long-Range Goal: Reduce and Manage My Symptoms of Anxiety and Depression.   Start Date: 12/03/2020  Expected End Date: 03/05/2021  This Visit's Progress: On track  Priority: High  Note:   Current Barriers:   Acute Mental Health needs related to Major Depressive Disorder, Single Episode, Mild, Transient Ischemic Attack, Hypertension, Aneurysm, Type II Diabetes Mellitus with Circulatory Disorder, Anxiety State and Family Discord requires Support, Education, Resources, Referrals and Care Coordination in order to meet unmet mental health needs. Clinical Goal(s):  Patient will work with LCSW to reduce and manage symptoms of Anxiety and Depression, until established with a community provider.   Patient will increase knowledge and/or ability of:        Coping Skills, Healthy Habits, Self-Management Skills, Stress Reduction, Home Safety and Utilizing [employer] and Resources.   Clinical Interventions:  Assessed patient's previous treatment, needs, coping skills, current treatment, support system and barriers to care. PHQ-2 and PHQ-9 Depression Screening Tool performed and results reviewed with patient.  Suicidal Ideation/Homicidal Ideation Assessed - None  present, patient reported. Solution-Focused Therapy performed, Active Listening/Reflection utilized, Engineer, petroleum provided, Brief Cognitive Behavioral Therapy initiated, Quality of Sleep assessed and Sleep Hygiene Techniques promoted, Support Group Participation encouraged, Increase Level of Activity/Exercise emphasized, and Verbalization of Feelings encouraged.   Mindfulness Meditation Strategies, Relaxation Techniques and Deep Breathing Exercises taught and encouraged daily.   Discussed plans with patient for ongoing care management  follow-up and provided patient with direct contact information for care management team. Discussed several options for long-term counseling based on need and insurance, and patient agreed to consider allowing LCSW to place a referral for her to [facility], for ongoing mental health counseling and supportive services.  Collaboration with Primary Care Physician, Dr. Janett Billow Copland regarding development and update of comprehensive plan of care as evidenced by provider attestation and co-signature. Inter-disciplinary care team collaboration (see longitudinal plan of care). Patient Goals/Self-Care Activities: Begin personal counseling with LCSW, on a weekly/bi-weekly basis, to reduce and manage symptoms of Anxiety and Depression, until established with [facility] or another community provider. Incorporate into daily practice - relaxation techniques, deep breathing exercises and mindfulness meditation strategies. Talk about feelings with a friend, family member, or spiritual advisor. Contact LCSW directly (# [id number]) if you have questions, need assistance, or if additional social work needs arise. Follow-Up:  12/10/2020 at 1:15pm      Problem: Advanced Care Planning.   Priority: Medium     Goal: Advanced Care Planning.   Start Date: 12/03/2020  Expected End Date: 01/02/2021  This Visit's Progress: On track  Priority: Medium  Note:   Current Barriers:   Patient does not have Advance Directives (Living Will and [place] documents) in place.  Patient needs Advance Directives packet mailed to her home, as well as education, support and coordination, in order to meet this unmet need. Clinical Goal(s):  Patient will work with LCSW to complete Advance Directives packet, and will have Living Will and Healthcare Power of Attorney documents notarized, providing a copy to Primary Care Physician, Dr. Janett Billow Copland to scan into electronic medical record.    Interventions: Assessed understanding of Advance Directives. A voluntary discussion about Advanced Care Planning, including importance of Advance Directives, Healthcare Proxy and Living Will were discussed.  Educational information on Advance Directives, as well as an Engineer, mining provided.  Collaboration with Primary Care Physician, Dr. Janett Billow Copland regarding development and update of comprehensive plan of care as evidenced by provider attestation and co-signature. Inter-disciplinary care team collaboration (see longitudinal plan of care). Patient Goals/Self-Care Activities: Review educational information on Advance Directives.  Be prepared to review and discuss Advance Directives with LCSW during our next scheduled telephone outreach call.   Follow-Up Date:  12/10/2020 at 1:15pm      [place] Licensed Clinical Social Worker [place] [phone]

## 2020-12-03 NOTE — Patient Instructions (Signed)
Visit Information   PATIENT GOALS:   Goals Addressed               This Visit's Progress     Advanced Care Planning. (pt-stated)   On track     Timeframe:  Short-Term Goal Priority:  Medium Start Date:   12/03/2020                      Expected End Date:  01/03/2021                    Follow-Up Date:  12/10/2020 at 1:15pm  Patient Goals/Self-Care Activities: Review educational information on Advance Directives.  Be prepared to review and discuss Advance Directives with LCSW during our next scheduled telephone outreach call.         Reduce and Manage My Symptoms of Anxiety and Depression. (pt-stated)   On track     Timeframe:  Long-Range Goal Priority:  High Start Date:   12/03/2020                          Expected End Date:   03/05/2020                    Follow-Up Date:  12/10/2020 at 1:15pm  Patient Goals/Self-Care Activities: Begin personal counseling with LCSW, on a weekly/bi-weekly basis, to reduce and manage symptoms of Anxiety and Depression, until established with [facility] or another community provider. Incorporate into daily practice - relaxation techniques, deep breathing exercises and mindfulness meditation strategies. Talk about feelings with a friend, family member, or spiritual advisor. Contact LCSW directly (# [id number]) if you have questions, need assistance, or if additional social work needs arise.        Consent to CCM Services: Ms. Coffin was given information about Chronic Care Management services including:  CCM service includes personalized support from designated clinical staff supervised by her physician, including individualized plan of care and coordination with other care providers 24/7 contact phone numbers for assistance for urgent and routine care needs. Service will only be billed when office clinical staff spend 20 minutes or more in a month to coordinate care. Only one practitioner may furnish and bill the service in a  calendar month. The patient may stop CCM services at any time (effective at the end of the month) by phone call to the office staff. The patient will be responsible for cost sharing (co-pay) of up to 20% of the service fee (after annual deductible is met).  Patient agreed to services and verbal consent obtained.   Patient verbalizes understanding of instructions provided today and agrees to view in [place].   Telephone follow up appointment with care management team member scheduled for:  12/10/2020 at 1:15pm  [place] Licensed Clinical Social Worker [place] [phone]

## 2020-12-04 ENCOUNTER — Encounter: Payer: Self-pay | Admitting: *Deleted

## 2020-12-08 DIAGNOSIS — G459 Transient cerebral ischemic attack, unspecified: Secondary | ICD-10-CM | POA: Diagnosis not present

## 2020-12-08 DIAGNOSIS — F320 Major depressive disorder, single episode, mild: Secondary | ICD-10-CM

## 2020-12-08 DIAGNOSIS — I10 Essential (primary) hypertension: Secondary | ICD-10-CM | POA: Diagnosis not present

## 2020-12-08 DIAGNOSIS — E118 Type 2 diabetes mellitus with unspecified complications: Secondary | ICD-10-CM | POA: Diagnosis not present

## 2020-12-08 DIAGNOSIS — J45909 Unspecified asthma, uncomplicated: Secondary | ICD-10-CM

## 2020-12-09 ENCOUNTER — Other Ambulatory Visit (HOSPITAL_COMMUNITY): Payer: Self-pay | Admitting: Interventional Radiology

## 2020-12-09 ENCOUNTER — Other Ambulatory Visit: Payer: Self-pay | Admitting: Family Medicine

## 2020-12-09 DIAGNOSIS — Z98818 Other dental procedure status: Secondary | ICD-10-CM

## 2020-12-09 DIAGNOSIS — I671 Cerebral aneurysm, nonruptured: Secondary | ICD-10-CM

## 2020-12-09 MED ORDER — AMOXICILLIN 500 MG PO CAPS: 2000.0000 mg | ORAL_CAPSULE | Freq: Once | ORAL | 2 refills | Status: DC

## 2020-12-10 ENCOUNTER — Ambulatory Visit (INDEPENDENT_AMBULATORY_CARE_PROVIDER_SITE_OTHER): Payer: Medicare Other | Admitting: *Deleted

## 2020-12-10 DIAGNOSIS — F320 Major depressive disorder, single episode, mild: Secondary | ICD-10-CM

## 2020-12-10 DIAGNOSIS — I10 Essential (primary) hypertension: Secondary | ICD-10-CM

## 2020-12-10 DIAGNOSIS — I679 Cerebrovascular disease, unspecified: Secondary | ICD-10-CM

## 2020-12-10 DIAGNOSIS — F32A Depression, unspecified: Secondary | ICD-10-CM

## 2020-12-10 DIAGNOSIS — H8109 Meniere's disease, unspecified ear: Secondary | ICD-10-CM

## 2020-12-10 DIAGNOSIS — E118 Type 2 diabetes mellitus with unspecified complications: Secondary | ICD-10-CM

## 2020-12-10 DIAGNOSIS — F411 Generalized anxiety disorder: Secondary | ICD-10-CM

## 2020-12-10 DIAGNOSIS — G459 Transient cerebral ischemic attack, unspecified: Secondary | ICD-10-CM

## 2020-12-10 DIAGNOSIS — I729 Aneurysm of unspecified site: Secondary | ICD-10-CM

## 2020-12-10 NOTE — Patient Instructions (Addendum)
Visit Information  Patient verbalizes understanding of instructions provided today and agrees to view in [place].   Telephone follow up appointment with care management team member scheduled for:  12/22/2020 at 12:30pm  [place] Licensed Clinical Social Worker [place] [phone]

## 2020-12-10 NOTE — Chronic Care Management (AMB) (Signed)
Chronic Care Management    Clinical Social Work Note  12/10/2020 Name: Sherry [name] MRN: [id number] DOB: 12/29/1950  Sherry [name] is a 70 y.o. year old female who is a primary care patient of Copland, Sherry Filler, MD. The CCM team was consulted to assist the patient with chronic disease management and/or care coordination needs related to: [place] and Resources.   Engaged with patient by telephone for follow-up visit in response to provider referral for social work chronic care management and care coordination services.   Consent to Services:  The patient was given information about Chronic Care Management services, agreed to services, and gave verbal consent prior to initiation of services.  Please see initial visit note for detailed documentation.   Patient agreed to services and consent obtained.   Assessment: Review of patient past medical history, allergies, medications, and health status, including review of relevant consultants reports was performed today as part of a comprehensive evaluation and provision of chronic care management and care coordination services.     SDOH (Social Determinants of Health) assessments and interventions performed:    Advanced Directives Status: Not addressed in this encounter.  CCM Care Plan  Allergies  Allergen Reactions   Influenza Vaccines Anaphylaxis   Influenza Virus Vaccine Anaphylaxis   Atorvastatin Other (See Comments)    Joint pain / myalgias   Demerol Other (See Comments)    SEIZURE   Nsaids Other (See Comments)    NAPROXEN, IBUPROFEN, SKELAXIN---  CAUSES PALPITATIONS   Pravastatin Other (See Comments)    Myalgias    Prednisone Other (See Comments)    Caused her glucose to go up to 500 and went to ED   Rosuvastatin Other (See Comments)    Joint stiffness and muscle pain   Roxicodone [Oxycodone] Itching and Other (See Comments)    Hallucinations and itching   Tramadol Other (See  Comments)    Contraindicated with Victoza    Codeine Itching   Mango Flavor [Flavoring Agent] Itching    Outpatient Encounter Medications as of 12/10/2020  Medication Sig   albuterol (VENTOLIN HFA) 108 (90 Base) MCG/ACT inhaler Inhale 2 puffs into the lungs every 6 (six) hours as needed for wheezing or shortness of breath.   Blood Glucose Monitoring Suppl (ONE TOUCH ULTRA 2) w/Device KIT Use to check glucose up to twice daily. E11.9 dx code   cetirizine (ZYRTEC) 10 MG tablet Take 1 tablet (10 mg total) by mouth daily.   Cholecalciferol (VITAMIN D3) 50 MCG (2000 UT) CAPS Take 2,000 Units by mouth daily. In morning   clopidogrel (PLAVIX) 75 MG tablet TAKE ONE TABLET BY MOUTH EVERY MORNING   empagliflozin (JARDIANCE) 10 MG TABS tablet Take 1 tablet (10 mg total) by mouth daily.   famotidine (PEPCID) 20 MG tablet Take 20 mg by mouth daily.    fluticasone (FLOVENT HFA) 220 MCG/ACT inhaler INHALE 1 PUFF BY MOUTH INTO LUNGS TWICE DAILY. MAY INCREASE TO TWO PUFFS IF needed   furosemide (LASIX) 20 MG tablet TAKE ONE TABLET BY MOUTH EVERY MORNING   gabapentin (NEURONTIN) 300 MG capsule TAKE TWO CAPSULES BY MOUTH EVERY MORNING and TAKE TWO CAPSULES BY MOUTH EVERYDAY AT BEDTIME   glucose blood (ONETOUCH ULTRA) test strip Use to check glucose up to twice daily. E11.9 dx code   glucose blood (ONETOUCH VERIO) test strip 1 each by Other route daily. Use as instructed   ipratropium (ATROVENT) 0.03 % nasal spray Place 1 spray into the nose  every morning.   linagliptin (TRADJENTA) 5 MG TABS tablet Take 0.5 tablets (2.5 mg total) by mouth daily. TAKE 1/2 TABLET BY MOUTH DAILY *NEED OFFICE VISIT*   lisinopril (ZESTRIL) 20 MG tablet TAKE ONE TABLET BY MOUTH EVERY MORNING   metFORMIN (GLUCOPHAGE) 500 MG tablet TAKE TWO TABLETS BY MOUTH EVERY MORNING and TAKE TWO TABLETS BY MOUTH EVERY EVENING   metoprolol tartrate (LOPRESSOR) 25 MG tablet Take 0.5 tablets (12.5 mg total) by mouth 2 (two) times daily.   Multiple  Vitamins-Minerals (MULTIVITAMIN PO) Take 1 tablet by mouth daily.   OneTouch Delica Lancets 63S MISC Use to check glucose up to twice daily. E11.9 dx code   No facility-administered encounter medications on file as of 12/10/2020.    Patient Active Problem List   Diagnosis Date Noted   Traumatic hematoma of left lower leg 09/26/2018   S/P total knee arthroplasty, left 03/29/2017   Spondylosis without myelopathy or radiculopathy, cervical region 03/29/2017   Bilateral carpal tunnel syndrome 03/29/2017   Acute asthma exacerbation 08/19/2016   Cervicalgia 03/23/2016   Vertigo 04/14/2015   Anxiety state 01/22/2015   HLD (hyperlipidemia) 01/22/2015   Type 2 diabetes mellitus with circulatory disorder ([place]) 01/22/2015   Intracranial vascular stenosis 01/22/2015   Aneurysm ([place])    Headache    Essential hypertension 11/12/2014   Dependent edema 11/12/2014   Peripheral neuropathy 11/12/2014   Depression 11/12/2014   Facial droop    TIA (transient ischemic attack) 11/11/2014   Hyperthyroidism 08/06/2014   Meniere's disease 06/07/2014   Goiter 06/13/2013   Obesity, unspecified 06/13/2013    Conditions to be addressed/monitored: Anxiety and Depression.  Limited Social Support, Mental Health Concerns, Family and Relationship Dysfunction, Social Isolation, and Lacks Knowledge of [employer].  Care Plan : LCSW Plan of Care  Updates made by Francis Gaines, LCSW since 12/10/2020 12:00 AM     Problem: Reduce and Manage My Symptoms of Anxiety and Depression.   Priority: High     Long-Range Goal: Reduce and Manage My Symptoms of Anxiety and Depression.   Start Date: 12/03/2020  Expected End Date: 03/05/2021  This Visit's Progress: On track  Recent Progress: On track  Priority: High  Note:   Current Barriers:   Acute Mental Health needs related to Major Depressive Disorder, Single Episode, Mild, Transient Ischemic Attack, Hypertension, Aneurysm, Type II Diabetes Mellitus with  Circulatory Disorder, Anxiety State and Family Discord requires Support, Education, Resources, Referrals and Care Coordination in order to meet unmet mental health needs. Clinical Goal(s):  Patient will work with LCSW to reduce and manage symptoms of Anxiety and Depression, until established with a community provider.   Patient will increase knowledge and/or ability of:  Coping Skills, Healthy Habits, Self-Management Skills, Stress Reduction, Home Safety and Utilizing [employer] and Resources.   Clinical Interventions:  Collaboration with Primary Care Physician, Dr. Janett Billow Copland regarding development and update of comprehensive plan of care as evidenced by provider attestation and co-signature. Inter-disciplinary care team collaboration (see longitudinal plan of care). Patient Goals/Self-Care Activities: Receive personal counseling with LCSW, on a weekly/bi-weekly basis, to reduce and manage symptoms of Anxiety and Depression, until established with [facility] or another community provider. Continue to incorporate into daily practice - relaxation techniques, deep breathing exercises and mindfulness meditation strategies. Encouraged to establish services with the [facility] ([facility] [phone]), to obtain a wide range of supportive resources, such as talking with a victim advocate, getting assistance with filing a restraining order, planning  for safety, talking to a Curator, meeting with a professional to discuss civil and criminal legal issues, receiving medical assistance, and gaining information on how to access other community resources. Contact LCSW directly (# [id number]) if you have questions, need assistance, or if additional social work needs arise. Follow-Up:  12/22/2020 at 12:30pm      Problem: Advanced Care Planning.   Priority: Medium     Goal: Advanced Care Planning.   Start Date: 12/03/2020  Expected End Date: 01/02/2021   This Visit's Progress: On track  Recent Progress: On track  Priority: Medium  Note:   Current Barriers:   Patient does not have Advance Directives (Living Will and [place] documents) in place.  Patient needs Advance Directives packet mailed to her home, as well as education, support and coordination, in order to meet this unmet need. Clinical Goal(s):  Patient will work with LCSW to complete Advance Directives packet, and will have Living Will and Healthcare Power of Attorney documents notarized, providing a copy to Primary Care Physician, Dr. Janett Billow Copland to scan into electronic medical record.   Interventions: Assessed understanding of Advance Directives. A voluntary discussion about Advanced Care Planning, including importance of Advance Directives, Healthcare Proxy and Living Will were discussed.  Educational information on Advance Directives, as well as an Engineer, mining provided.  Collaboration with Primary Care Physician, Dr. Janett Billow Copland regarding development and update of comprehensive plan of care as evidenced by provider attestation and co-signature. Inter-disciplinary care team collaboration (see longitudinal plan of care). Patient Goals/Self-Care Activities: Review EMMI educational information on Advance Directives and be prepared to review and discuss with LCSW during our next scheduled telephone outreach call.    Advance Directives had not arrived by mail, as of 11:30am today (12/10/2020).  Follow-Up Date:  12/22/2020 at 12:30pm     [place] Licensed Clinical Social Worker [place] [phone]

## 2020-12-16 ENCOUNTER — Telehealth: Payer: Self-pay | Admitting: Pharmacist

## 2020-12-16 NOTE — Chronic Care Management (AMB) (Signed)
Chronic Care Management Pharmacy Assistant   Name: Sherry [name]  MRN: [id number] DOB: Jul 03, 1950  Reason for Encounter: Medication coordination call    Medications: Outpatient Encounter Medications as of 12/16/2020  Medication Sig   albuterol (VENTOLIN HFA) 108 (90 Base) MCG/ACT inhaler Inhale 2 puffs into the lungs every 6 (six) hours as needed for wheezing or shortness of breath.   Blood Glucose Monitoring Suppl (ONE TOUCH ULTRA 2) w/Device KIT Use to check glucose up to twice daily. E11.9 dx code   cetirizine (ZYRTEC) 10 MG tablet Take 1 tablet (10 mg total) by mouth daily.   Cholecalciferol (VITAMIN D3) 50 MCG (2000 UT) CAPS Take 2,000 Units by mouth daily. In morning   clopidogrel (PLAVIX) 75 MG tablet TAKE ONE TABLET BY MOUTH EVERY MORNING   empagliflozin (JARDIANCE) 10 MG TABS tablet Take 1 tablet (10 mg total) by mouth daily.   famotidine (PEPCID) 20 MG tablet Take 20 mg by mouth daily.    fluticasone (FLOVENT HFA) 220 MCG/ACT inhaler INHALE 1 PUFF BY MOUTH INTO LUNGS TWICE DAILY. MAY INCREASE TO TWO PUFFS IF needed   furosemide (LASIX) 20 MG tablet TAKE ONE TABLET BY MOUTH EVERY MORNING   gabapentin (NEURONTIN) 300 MG capsule TAKE TWO CAPSULES BY MOUTH EVERY MORNING and TAKE TWO CAPSULES BY MOUTH EVERYDAY AT BEDTIME   glucose blood (ONETOUCH ULTRA) test strip Use to check glucose up to twice daily. E11.9 dx code   glucose blood (ONETOUCH VERIO) test strip 1 each by Other route daily. Use as instructed   ipratropium (ATROVENT) 0.03 % nasal spray Place 1 spray into the nose every morning.   linagliptin (TRADJENTA) 5 MG TABS tablet Take 0.5 tablets (2.5 mg total) by mouth daily. TAKE 1/2 TABLET BY MOUTH DAILY *NEED OFFICE VISIT*   lisinopril (ZESTRIL) 20 MG tablet TAKE ONE TABLET BY MOUTH EVERY MORNING   metFORMIN (GLUCOPHAGE) 500 MG tablet TAKE TWO TABLETS BY MOUTH EVERY MORNING and TAKE TWO TABLETS BY MOUTH EVERY EVENING   metoprolol tartrate (LOPRESSOR) 25 MG tablet Take  0.5 tablets (12.5 mg total) by mouth 2 (two) times daily.   Multiple Vitamins-Minerals (MULTIVITAMIN PO) Take 1 tablet by mouth daily.   OneTouch Delica Lancets 88E MISC Use to check glucose up to twice daily. E11.9 dx code   No facility-administered encounter medications on file as of 12/16/2020.   Reviewed chart for medication changes ahead of medication coordination call.  No OVs, Consults, or hospital visits since last care coordination call/Pharmacist visit. (If appropriate, list visit date, provider name)  No medication changes indicated OR if recent visit, treatment plan here.  BP Readings from Last 3 Encounters:  07/30/20 (!) 148/82  07/24/20 (!) 145/63  06/02/20 126/68    Lab Results  Component Value Date   HGBA1C 6.6 (H) 06/02/2020     Patient obtains medications through Adherence Packaging  30 Days   Last adherence delivery included:  Famotidine 20 mg; one tab with Breakfast Certavite Senior Vitamin one tab with breakfast Cetirizine 10 mg; one tab with Breakfast Tradjenta 5 mg tab; one half with Breakfast Furosemide 20 mg tab; with Breakfast Metformin 500 mg tab; two with Breakfast, two with [place] Metoprolol Tar 25 mg tab; one half with Breakfast, one half at Bedtime Lisinopril 20 mg tab; one with Breakfast Jardiance 10 mg tab; one half tab with Breakfast Clopidogrel 75 mg tab; one with Breakfast Gabapentin 300 mg cap; two with Breakfast, two at Bedtime  Patient declined meds last month due  to PRN use/additional supply on hand. Atrovent 0.03% spray 2 sprays into nose 4 times daily Albuterol ( ventolin HFA) 108 (90 Base) MCG?ACT inhaler; Inhale 2 puffs into the lungs every 6 (six) hours as needed for wheezing or shortness of breath. Amoxicillin 500 mg (Vial for dental work) Flovent HFA Aer 220 mcg INHALE 1 PUFF BY MOUTH INTO LUNGS TWICE DAILY. MAY INCREASE TO TWO PUFFS  One Touch Lancets (has enough on hand) One Touch Test Strips  Patient is due for next  adherence delivery on: 12/29/20. Called patient and reviewed medications and coordinated delivery.  This delivery to include: Tradjenta 5 mg half a tab daily Jardiance 10 g half a tab daily Furosemide 20 mg 1 tab daily Metformin 500 mg 4 tabs daily Lisinopril 20 mg 1 tab daily Metoprolol tar 25 mg half a tab twice daily Gabapentin 300 mg 4 tabs daily Clopidogrel 75 mg 1 tab dialy Vitamin D3 50 mcg 1 tab daily Cetirizine 10 mg 1 tab daily Famotidine 20 mg 1 tab daily.   Confirmed delivery date of 12/29/20, advised patient that pharmacy will contact them the morning of delivery.

## 2020-12-17 ENCOUNTER — Other Ambulatory Visit: Payer: Self-pay

## 2020-12-17 ENCOUNTER — Ambulatory Visit (HOSPITAL_COMMUNITY)
Admission: RE | Admit: 2020-12-17 | Discharge: 2020-12-17 | Disposition: A | Discharge status: Home / Self Care | Payer: Medicare Other | Source: Ambulatory Visit | Attending: Interventional Radiology | Admitting: Interventional Radiology

## 2020-12-17 DIAGNOSIS — I720 Aneurysm of carotid artery: Secondary | ICD-10-CM | POA: Diagnosis not present

## 2020-12-17 DIAGNOSIS — I671 Cerebral aneurysm, nonruptured: Secondary | ICD-10-CM | POA: Insufficient documentation

## 2020-12-17 DIAGNOSIS — I6601 Occlusion and stenosis of right middle cerebral artery: Secondary | ICD-10-CM | POA: Diagnosis not present

## 2020-12-17 DIAGNOSIS — I6522 Occlusion and stenosis of left carotid artery: Secondary | ICD-10-CM | POA: Diagnosis not present

## 2020-12-22 ENCOUNTER — Ambulatory Visit: Payer: Medicare Other | Admitting: *Deleted

## 2020-12-22 DIAGNOSIS — E118 Type 2 diabetes mellitus with unspecified complications: Secondary | ICD-10-CM

## 2020-12-22 DIAGNOSIS — I729 Aneurysm of unspecified site: Secondary | ICD-10-CM

## 2020-12-22 DIAGNOSIS — I679 Cerebrovascular disease, unspecified: Secondary | ICD-10-CM

## 2020-12-22 DIAGNOSIS — H8109 Meniere's disease, unspecified ear: Secondary | ICD-10-CM

## 2020-12-22 DIAGNOSIS — F320 Major depressive disorder, single episode, mild: Secondary | ICD-10-CM

## 2020-12-22 DIAGNOSIS — F32A Depression, unspecified: Secondary | ICD-10-CM

## 2020-12-22 DIAGNOSIS — F411 Generalized anxiety disorder: Secondary | ICD-10-CM

## 2020-12-22 DIAGNOSIS — R609 Edema, unspecified: Secondary | ICD-10-CM

## 2020-12-22 DIAGNOSIS — G459 Transient cerebral ischemic attack, unspecified: Secondary | ICD-10-CM

## 2020-12-22 NOTE — Patient Instructions (Addendum)
Visit Information  Care Plan : LCSW Plan of Care  Updates made by Francis Gaines, LCSW since 12/22/2020 12:00 AM       Problem: Reduce and Manage My Symptoms of Anxiety and Depression.    Priority: High       Long-Range Goal: Reduce and Manage My Symptoms of Anxiety and Depression.    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 Clinical Goal(s):  Patient will work with LCSW to reduce and manage symptoms of Anxiety and Depression.  Patient will increase knowledge and/or ability of:  Coping Skills, Healthy Habits, Self-Management Skills, Stress Reduction, Home Safety and Utilizing [employer] and Resources.   Clinical Interventions:  Collaboration with Primary Care Physician, Dr. Janett Billow Copland regarding development and update of comprehensive plan of care as evidenced by provider attestation and co-signature. Inter-disciplinary care team collaboration (see longitudinal plan of care). Patient Goals/Self-Care Activities: Receive personal counseling with LCSW, on a weekly/bi-weekly basis, to reduce and manage symptoms of Anxiety and Depression, until manageable.               No longer interested in receiving ongoing counseling and supportive services through [facility], or any other community agency provider. Extensive conversation regarding the [facility] [phone]), and the need to establish services. Receive a one-on-one consultation with a victim advocate, obtain knowledge on how to file a  restraining order, talk with law enforcement about  safety, meet with a professional to discuss civil and criminal legal issues, receive medical assistance, and gain information on how to access other  community resources. Consider volunteering at [place], or become a domestic violence advocate, in honor of yourself, and in memory of your grandson, killed earlier this year in a domestic violence dispute.    Contact LCSW directly (# [id number]) if you have questions, need assistance, or if additional social work needs are identified between now and our next scheduled telephone outreach call. Follow-Up:  01/05/2021 at 10:15am          Problem: Advanced Care Planning. Resolved 12/22/2020  Priority: Medium       Goal: Advanced Care Planning. Completed 12/22/2020  Start Date: 12/03/2020  Expected End Date: 12/22/2020  This Visit's Progress: On track  Recent Progress: On track  Priority: Medium  Note:   Current Barriers:   Patient does not have Advance Directives (Living Will and [place] documents) in place.  Patient needs Advance Directives packet mailed to her home, as well as education, support and coordination, in order to meet this unmet need. Clinical Goal(s):  Patient will work with LCSW to complete Advance Directives packet, and will have Living Will and Healthcare Power of Attorney documents notarized, providing a copy to Primary Care Physician, Dr. Janett Billow Copland to scan into electronic medical record.   Interventions: Collaboration with Primary Care Physician, Dr. Janett Billow Copland regarding development and update of comprehensive plan of care as evidenced by provider attestation and co-signature. Inter-disciplinary care team collaboration (see longitudinal plan of care). Patient Goals/Self-Care Activities: Please provide your Primary Care Physician, Dr. Janett Billow Copland  with a copy of your completed, signed and notarized Advanced  Directives (Living Will and [place]) to scan into your electronic medical record. Follow-Up Date:  No follow-up required, per patient.      Nat Christen LCSW Licensed Clinical Social Worker [place] [phone]    Patient verbalizes understanding of instructions provided today and agrees to view in Post.   Telephone follow up appointment with care management team member scheduled for:  01/05/2021 at 10:15am  [place] Licensed Clinical Social Worker [place] [phone]

## 2020-12-22 NOTE — Chronic Care Management (AMB) (Signed)
Chronic Care Management    Clinical Social Work Note  12/22/2020 Name: Sherry [name] MRN: [id number] DOB: 07/04/1950  Sherry [name] is a 70 y.o. year old female who is a primary care patient of Copland, Gay Filler, MD. The CCM team was consulted to assist the patient with chronic disease management and/or care coordination needs related to: [employer], Holiday representative and Completion, Mental Health Counseling and Resources, and Grief Counseling.   Engaged with patient by telephone for follow up visit in response to provider referral for social work chronic care management and care coordination services.   Consent to Services:  The patient was given information about Chronic Care Management services, agreed to services, and gave verbal consent prior to initiation of services.  Please see initial visit note for detailed documentation.   Patient agreed to services and consent obtained.   Assessment: Review of patient past medical history, allergies, medications, and health status, including review of relevant consultants reports was performed today as part of a comprehensive evaluation and provision of chronic care management and care coordination services.     SDOH (Social Determinants of Health) assessments and interventions performed:    Advanced Directives Status: See Care Plan for related entries.   CCM Care Plan  Allergies  Allergen Reactions   Influenza Vaccines Anaphylaxis   Influenza Virus Vaccine Anaphylaxis   Atorvastatin Other (See Comments)    Joint pain / myalgias   Demerol Other (See Comments)    SEIZURE   Nsaids Other (See Comments)    NAPROXEN, IBUPROFEN, SKELAXIN---  CAUSES PALPITATIONS   Pravastatin Other (See Comments)    Myalgias    Prednisone Other (See Comments)    Caused her glucose to go up to 500 and went to ED   Rosuvastatin Other (See Comments)    Joint stiffness and muscle pain   Roxicodone [Oxycodone] Itching and Other  (See Comments)    Hallucinations and itching   Tramadol Other (See Comments)    Contraindicated with Victoza    Codeine Itching   Mango Flavor [Flavoring Agent] Itching    Outpatient Encounter Medications as of 12/22/2020  Medication Sig   albuterol (VENTOLIN HFA) 108 (90 Base) MCG/ACT inhaler Inhale 2 puffs into the lungs every 6 (six) hours as needed for wheezing or shortness of breath.   Blood Glucose Monitoring Suppl (ONE TOUCH ULTRA 2) w/Device KIT Use to check glucose up to twice daily. E11.9 dx code   cetirizine (ZYRTEC) 10 MG tablet Take 1 tablet (10 mg total) by mouth daily.   Cholecalciferol (VITAMIN D3) 50 MCG (2000 UT) CAPS Take 2,000 Units by mouth daily. In morning   clopidogrel (PLAVIX) 75 MG tablet TAKE ONE TABLET BY MOUTH EVERY MORNING   empagliflozin (JARDIANCE) 10 MG TABS tablet Take 1 tablet (10 mg total) by mouth daily.   famotidine (PEPCID) 20 MG tablet Take 20 mg by mouth daily.    fluticasone (FLOVENT HFA) 220 MCG/ACT inhaler INHALE 1 PUFF BY MOUTH INTO LUNGS TWICE DAILY. MAY INCREASE TO TWO PUFFS IF needed   furosemide (LASIX) 20 MG tablet TAKE ONE TABLET BY MOUTH EVERY MORNING   gabapentin (NEURONTIN) 300 MG capsule TAKE TWO CAPSULES BY MOUTH EVERY MORNING and TAKE TWO CAPSULES BY MOUTH EVERYDAY AT BEDTIME   glucose blood (ONETOUCH ULTRA) test strip Use to check glucose up to twice daily. E11.9 dx code   glucose blood (ONETOUCH VERIO) test strip 1 each by Other route daily. Use as instructed   ipratropium (ATROVENT)  0.03 % nasal spray Place 1 spray into the nose every morning.   linagliptin (TRADJENTA) 5 MG TABS tablet Take 0.5 tablets (2.5 mg total) by mouth daily. TAKE 1/2 TABLET BY MOUTH DAILY *NEED OFFICE VISIT*   lisinopril (ZESTRIL) 20 MG tablet TAKE ONE TABLET BY MOUTH EVERY MORNING   metFORMIN (GLUCOPHAGE) 500 MG tablet TAKE TWO TABLETS BY MOUTH EVERY MORNING and TAKE TWO TABLETS BY MOUTH EVERY EVENING   metoprolol tartrate (LOPRESSOR) 25 MG tablet Take 0.5  tablets (12.5 mg total) by mouth 2 (two) times daily.   Multiple Vitamins-Minerals (MULTIVITAMIN PO) Take 1 tablet by mouth daily.   OneTouch Delica Lancets 72Q MISC Use to check glucose up to twice daily. E11.9 dx code   No facility-administered encounter medications on file as of 12/22/2020.    Patient Active Problem List   Diagnosis Date Noted   Traumatic hematoma of left lower leg 09/26/2018   S/P total knee arthroplasty, left 03/29/2017   Spondylosis without myelopathy or radiculopathy, cervical region 03/29/2017   Bilateral carpal tunnel syndrome 03/29/2017   Acute asthma exacerbation 08/19/2016   Cervicalgia 03/23/2016   Vertigo 04/14/2015   Anxiety state 01/22/2015   HLD (hyperlipidemia) 01/22/2015   Type 2 diabetes mellitus with circulatory disorder ([place]) 01/22/2015   Intracranial vascular stenosis 01/22/2015   Aneurysm ([place])    Headache    Essential hypertension 11/12/2014   Dependent edema 11/12/2014   Peripheral neuropathy 11/12/2014   Depression 11/12/2014   Facial droop    TIA (transient ischemic attack) 11/11/2014   Hyperthyroidism 08/06/2014   Meniere's disease 06/07/2014   Goiter 06/13/2013   Obesity, unspecified 06/13/2013    Conditions to be addressed/monitored: Depression, Grief and Loss.  Limited Social Support, Mental Health Concerns, Family and Relationship Dysfunction, and Lacks Knowledge of [employer].  Care Plan : LCSW Plan of Care  Updates made by Francis Gaines, LCSW since 12/22/2020 12:00 AM     Problem: Reduce and Manage My Symptoms of Anxiety and Depression.   Priority: High     Long-Range Goal: Reduce and Manage My Symptoms of Anxiety and Depression.   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 Clinical Goal(s):  Patient will work with LCSW to reduce and manage symptoms of Anxiety and Depression.  Patient will increase knowledge and/or ability of:  Coping Skills, Healthy Habits, Self-Management Skills, Stress Reduction, Home Safety and Utilizing [employer] and Resources.   Clinical Interventions:  Collaboration with Primary Care Physician, Dr. Janett Billow Copland regarding development and update of comprehensive plan of care as evidenced by provider attestation and co-signature. Inter-disciplinary care team collaboration (see longitudinal plan of care). Patient Goals/Self-Care Activities: Receive personal counseling with LCSW, on a weekly/bi-weekly basis, to reduce and manage symptoms of Anxiety and Depression, until manageable.    No longer interested in receiving ongoing counseling and supportive services through [facility], or any other community agency provider. Extensive conversation regarding the [facility] [phone]), and the need to establish services. Receive a one-on-one consultation with a victim advocate, obtain knowledge on how to file a restraining order, talk  with law enforcement about  safety, meet with a professional to discuss civil and criminal legal issues, receive medical assistance, and gain information on how to access other  community resources. Consider volunteering at [place], or become a domestic violence advocate, in honor of yourself, and in memory of your grandson, killed earlier this year in a domestic violence dispute.    Contact LCSW directly (# [id number]) if you have questions, need assistance, or if additional social work needs are identified between now and our next  scheduled telephone outreach call. Follow-Up:  01/05/2021 at 10:15am      Problem: Advanced Care Planning. Resolved 12/22/2020  Priority: Medium     Goal: Advanced Care Planning. Completed 12/22/2020  Start Date: 12/03/2020  Expected End Date: 12/22/2020  This Visit's Progress: On track  Recent Progress: On track  Priority: Medium  Note:   Current Barriers:   Patient does not have Advance Directives (Living Will and [place] documents) in place.  Patient needs Advance Directives packet mailed to her home, as well as education, support and coordination, in order to meet this unmet need. Clinical Goal(s):  Patient will work with LCSW to complete Advance Directives packet, and will have Living Will and Healthcare Power of Attorney documents notarized, providing a copy to Primary Care Physician, Dr. Janett Billow Copland to scan into electronic medical record.   Interventions: Collaboration with Primary Care Physician, Dr. Janett Billow Copland regarding development and update of comprehensive plan of care as evidenced by provider attestation and co-signature. Inter-disciplinary care team collaboration (see longitudinal plan of care). Patient Goals/Self-Care Activities: Please provide your Primary Care Physician, Dr. Janett Billow Copland with a copy of your completed, signed and notarized Advanced Directives (Living Will and [place]) to scan into your electronic medical record. Follow-Up Date:  No follow-up required, per patient.     [place] Licensed Clinical Social Worker [place] [phone]

## 2020-12-30 ENCOUNTER — Other Ambulatory Visit (HOSPITAL_COMMUNITY): Payer: Self-pay | Admitting: Interventional Radiology

## 2020-12-30 DIAGNOSIS — I671 Cerebral aneurysm, nonruptured: Secondary | ICD-10-CM

## 2021-01-05 ENCOUNTER — Telehealth: Payer: Self-pay | Admitting: *Deleted

## 2021-01-05 ENCOUNTER — Telehealth: Payer: Medicare Other | Admitting: *Deleted

## 2021-01-05 ENCOUNTER — Other Ambulatory Visit (HOSPITAL_COMMUNITY): Payer: Self-pay | Admitting: Physician Assistant

## 2021-01-05 NOTE — Telephone Encounter (Signed)
  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   An unsuccessful telephone outreach was attempted today. The patient was referred to the case management team for assistance with care management and care coordination. A HIPAA compliant message was left on voicemail, including contact information, encouraging patient to return LCSW's call at her earliest convenience.  LCSW will make a second follow-up telephone outreach call within the next 5-7 business days, if a return call is not received from patient in the meantime.  Follow-Up Plan:  LCSW placed request to Scheduling Care Guides to reschedule patient's follow-up telephone outreach call with LCSW.  [place] Licensed Clinical Social Worker [place] [phone]

## 2021-01-06 ENCOUNTER — Ambulatory Visit (HOSPITAL_COMMUNITY)
Admission: RE | Admit: 2021-01-06 | Discharge: 2021-01-06 | Disposition: A | Discharge status: Home / Self Care | Payer: Medicare Other | Source: Ambulatory Visit | Attending: Interventional Radiology | Admitting: Interventional Radiology

## 2021-01-06 ENCOUNTER — Other Ambulatory Visit (HOSPITAL_COMMUNITY): Payer: Self-pay | Admitting: Interventional Radiology

## 2021-01-06 ENCOUNTER — Other Ambulatory Visit: Payer: Self-pay

## 2021-01-06 DIAGNOSIS — I6522 Occlusion and stenosis of left carotid artery: Secondary | ICD-10-CM | POA: Insufficient documentation

## 2021-01-06 DIAGNOSIS — I10 Essential (primary) hypertension: Secondary | ICD-10-CM | POA: Insufficient documentation

## 2021-01-06 DIAGNOSIS — K219 Gastro-esophageal reflux disease without esophagitis: Secondary | ICD-10-CM | POA: Diagnosis not present

## 2021-01-06 DIAGNOSIS — Z8673 Personal history of transient ischemic attack (TIA), and cerebral infarction without residual deficits: Secondary | ICD-10-CM | POA: Diagnosis not present

## 2021-01-06 DIAGNOSIS — E785 Hyperlipidemia, unspecified: Secondary | ICD-10-CM | POA: Diagnosis not present

## 2021-01-06 DIAGNOSIS — I671 Cerebral aneurysm, nonruptured: Secondary | ICD-10-CM | POA: Insufficient documentation

## 2021-01-06 DIAGNOSIS — I720 Aneurysm of carotid artery: Secondary | ICD-10-CM | POA: Diagnosis not present

## 2021-01-06 DIAGNOSIS — I6601 Occlusion and stenosis of right middle cerebral artery: Secondary | ICD-10-CM | POA: Diagnosis not present

## 2021-01-06 HISTORY — PX: IR US GUIDE VASC ACCESS RIGHT: IMG2390

## 2021-01-06 HISTORY — PX: IR 3D INDEPENDENT WKST: IMG2385

## 2021-01-06 HISTORY — PX: IR ANGIO INTRA EXTRACRAN SEL COM CAROTID INNOMINATE BILAT MOD SED: IMG5360

## 2021-01-06 HISTORY — PX: IR ANGIO VERTEBRAL SEL VERTEBRAL UNI R MOD SED: IMG5368

## 2021-01-06 LAB — POCT I-STAT, CHEM 8
BUN: 12 mg/dL (ref 8–23)
Calcium, Ion: 1.21 mmol/L (ref 1.15–1.40)
Chloride: 105 mmol/L (ref 98–111)
Creatinine, Ser: 0.5 mg/dL (ref 0.44–1.00)
Glucose, Bld: 106 mg/dL — ABNORMAL HIGH (ref 70–99)
HCT: 41 % (ref 36.0–46.0)
Hemoglobin: 13.9 g/dL (ref 12.0–15.0)
Potassium: 3.7 mmol/L (ref 3.5–5.1)
Sodium: 142 mmol/L (ref 135–145)
TCO2: 26 mmol/L (ref 22–32)

## 2021-01-06 LAB — CBC
HCT: 46.2 % — ABNORMAL HIGH (ref 36.0–46.0)
Hemoglobin: 14 g/dL (ref 12.0–15.0)
MCH: 27.9 pg (ref 26.0–34.0)
MCHC: 30.3 g/dL (ref 30.0–36.0)
MCV: 92 fL (ref 80.0–100.0)
Platelets: 302 10*3/uL (ref 150–400)
RBC: 5.02 MIL/uL (ref 3.87–5.11)
RDW: 14.7 % (ref 11.5–15.5)
WBC: 6.3 10*3/uL (ref 4.0–10.5)
nRBC: 0 % (ref 0.0–0.2)

## 2021-01-06 LAB — PROTIME-INR
INR: 0.9 (ref 0.8–1.2)
Prothrombin Time: 12.3 seconds (ref 11.4–15.2)

## 2021-01-06 LAB — GLUCOSE, CAPILLARY: Glucose-Capillary: 115 mg/dL — ABNORMAL HIGH (ref 70–99)

## 2021-01-06 MED ORDER — SODIUM CHLORIDE 0.9 % IV SOLN: INTRAVENOUS | Status: DC

## 2021-01-06 MED ORDER — FENTANYL CITRATE (PF) 100 MCG/2ML IJ SOLN
25.0000 ug | Freq: Once | INTRAMUSCULAR | Status: AC
  Administered 2021-01-06: 25 ug via INTRAVENOUS

## 2021-01-06 MED ORDER — LIDOCAINE HCL (PF) 1 % IJ SOLN
INTRAMUSCULAR | Status: AC | PRN
  Administered 2021-01-06: 5 mL

## 2021-01-06 MED ORDER — SODIUM CHLORIDE (PF) 0.9 % IJ SOLN
INTRAVENOUS | Status: AC | PRN
  Administered 2021-01-06 (×2): 200 ug via INTRA_ARTERIAL

## 2021-01-06 MED ORDER — IOHEXOL 300 MG/ML  SOLN
100.0000 mL | Freq: Once | INTRAMUSCULAR | Status: AC | PRN
  Administered 2021-01-06: 20 mL via INTRA_ARTERIAL

## 2021-01-06 MED ORDER — HEPARIN SODIUM (PORCINE) 1000 UNIT/ML IJ SOLN
INTRAMUSCULAR | Status: AC | PRN
  Administered 2021-01-06: 2000 [IU] via INTRAVENOUS

## 2021-01-06 MED ORDER — IOHEXOL 300 MG/ML  SOLN
100.0000 mL | Freq: Once | INTRAMUSCULAR | Status: AC | PRN
  Administered 2021-01-06: 60 mL via INTRA_ARTERIAL

## 2021-01-06 MED ORDER — FENTANYL CITRATE (PF) 100 MCG/2ML IJ SOLN
INTRAMUSCULAR | Status: AC | PRN
  Administered 2021-01-06: 25 ug via INTRAVENOUS

## 2021-01-06 MED ORDER — VERAPAMIL HCL 2.5 MG/ML IV SOLN
INTRAVENOUS | Status: AC | PRN
  Administered 2021-01-06: 2.5 mg via INTRAVENOUS

## 2021-01-06 MED ORDER — ACETAMINOPHEN 500 MG PO TABS
1000.0000 mg | ORAL_TABLET | Freq: Once | ORAL | Status: AC
  Administered 2021-01-06: 1000 mg via ORAL

## 2021-01-06 MED ORDER — MIDAZOLAM HCL 2 MG/2ML IJ SOLN
INTRAMUSCULAR | Status: AC | PRN
  Administered 2021-01-06: 1 mg via INTRAVENOUS

## 2021-01-06 MED ORDER — SODIUM CHLORIDE 0.9 % IV SOLN: INTRAVENOUS | Status: AC

## 2021-01-06 MED ORDER — FENTANYL CITRATE (PF) 100 MCG/2ML IJ SOLN
50.0000 ug | Freq: Once | INTRAMUSCULAR | Status: AC
  Administered 2021-01-06: 50 ug via INTRAVENOUS

## 2021-01-06 NOTE — H&P (Signed)
Chief Complaint: Patient was seen in consultation today for diagnostic cerebral angiogram.  Referring Physician(s): Antony Contras, MD  Supervising Physician: Luanne Bras  Patient Status: [facility] - Out-pt  History of Present Illness: Sherry [name] is a 70 y.o. female with a past medical history significant DJD, GERD, MVP, HTN, HLD, CHF, Meniere's disease, TIA (2016, 2017), multiple intracranial stenoses and right cavernous ICA aneurysm who presents today for a diagnostic cerebral angiogram with moderate sedation. Sherry [name] has been followed by Dr. Estanislado Pandy since 2016 when she was admitted for left sided weakness, she underwent cerebral angiogram which noted a right ICA aneurysm and right MCA stenosis. She did not require treatment at that time and has been followed by NIR with regular imaging, most recently she underwent MRA on 12/18/20 which showed several areas of stenoses as well as the known right ICA aneurysm. She presents today for a diagnostic cerebral angiogram to further evaluate these imaging findings.  Sherry [name] reports that she has been doing well overall since she was last here, she does have nearly daily headaches which do not require medications and are dull in nature. The headaches tend to be all over her head and are not associated with any visual changes, n/v, dizziness or weakness. She does report some intermittent bilateral blurry vision which lasts 10-15 minutes at a time, she attributes this to her blood sugar being high. She also reports having some trouble remembering things lately, such as calling her son's girlfriend her granddaughter and not realizing she did that until late.  Past Medical History:  Diagnosis Date   Asthma    Cerebral aneurysm    being watched by Dr. Kathee Delton every 6 months   Complication of anesthesia HYPOTENSION INTRAOP W/ HYSTERECTOMY IN 1987--  DID [place] 2008   Diabetes mellitus    DJD (degenerative joint  disease)    Dyslipidemia    GERD (gastroesophageal reflux disease)    H/O hiatal hernia    History of CHF (congestive heart failure) 2010-  RESOLVED   History of peptic ulcer YRS AGO   Hypertension    Meniere's disease    Mitral valve prolapse occasional palpitations w/ chest discomfort   OA (osteoarthritis)    Rotator cuff tear, left    Stroke [facility])     Past Surgical History:  Procedure Laterality Date   BUNIONECTOMY  2007   LEFT FOOT   IR ANGIO INTRA EXTRACRAN SEL COM CAROTID INNOMINATE BILAT MOD SED  03/22/2019   IR ANGIO VERTEBRAL SEL SUBCLAVIAN INNOMINATE UNI L MOD SED  03/22/2019   IR ANGIO VERTEBRAL SEL VERTEBRAL UNI R MOD SED  03/22/2019   IR US GUIDE VASC ACCESS RIGHT  03/22/2019   LEFT SHOULDER SURGERY  1997   ROTATOR CUFF REPAIR   REVERSE SHOULDER ARTHROPLASTY Right 07/30/2020   Procedure: REVERSE SHOULDER ARTHROPLASTY;  Surgeon: Hiram Gash, MD;  Location: [facility];  Service: Orthopedics;  Laterality: Right;   SHOULDER ARTHROSCOPY  09/20/2011   Procedure: ARTHROSCOPY SHOULDER;  Surgeon: Johnn Hai, MD;  Location: [facility];  Service: Orthopedics;  Laterality: Left;  SAD AND MINI OPEN ROTATOR CUFF REPAIR     TOTAL KNEE ARTHROPLASTY  09-12-2006  [facility]   RIGHT KNEE   TOTAL KNEE ARTHROPLASTY Left 11/30/2017   Procedure: LEFT TOTAL KNEE REPLACEMENT;  Surgeon: [name], MD;  Location: [place];  Service: Orthopedics;  Laterality: Left;   [place];  1980   VAGINAL HYSTERECTOMY  1987    Allergies: Influenza vaccines, Atorvastatin, Demerol, Nsaids, Pravastatin, Prednisone, Rosuvastatin, Roxicodone [oxycodone], Tramadol, Codeine, and Mango flavor [flavoring agent]  Medications: Prior to Admission medications   Medication Sig Start Date End Date Taking? Authorizing Provider  albuterol (VENTOLIN HFA) 108 (90 Base) MCG/ACT inhaler Inhale 2 puffs into the lungs every 6 (six) hours as needed for wheezing or shortness of  breath. 04/28/20  Yes Copland, Gay Filler, MD  cetirizine (ZYRTEC) 10 MG tablet Take 1 tablet (10 mg total) by mouth daily. 07/16/16  Yes Copland, Gay Filler, MD  clopidogrel (PLAVIX) 75 MG tablet TAKE ONE TABLET BY MOUTH EVERY MORNING 08/21/20  Yes Copland, Gay Filler, MD  empagliflozin (JARDIANCE) 10 MG TABS tablet Take 1 tablet (10 mg total) by mouth daily. Patient taking differently: Take 5 mg by mouth daily. 10/22/20  Yes Copland, Gay Filler, MD  famotidine (PEPCID) 20 MG tablet Take 20 mg by mouth daily.    Yes [provider]  fluticasone (FLONASE) 50 MCG/ACT nasal spray Place 2 sprays into both nostrils daily.   Yes [provider]  fluticasone (FLOVENT HFA) 220 MCG/ACT inhaler INHALE 1 PUFF BY MOUTH INTO LUNGS TWICE DAILY. MAY INCREASE TO TWO PUFFS IF needed Patient taking differently: Inhale 2 puffs into the lungs 2 (two) times daily as needed (shortness of breath). 09/15/20  Yes Copland, Gay Filler, MD  furosemide (LASIX) 20 MG tablet TAKE ONE TABLET BY MOUTH EVERY MORNING 09/23/20  Yes Copland, Gay Filler, MD  gabapentin (NEURONTIN) 300 MG capsule TAKE TWO CAPSULES BY MOUTH EVERY MORNING and TAKE TWO CAPSULES BY MOUTH EVERYDAY AT BEDTIME 08/21/20  Yes Copland, Gay Filler, MD  linagliptin (TRADJENTA) 5 MG TABS tablet Take 0.5 tablets (2.5 mg total) by mouth daily. TAKE 1/2 TABLET BY MOUTH DAILY *NEED OFFICE VISIT* 10/20/20  Yes Copland, Gay Filler, MD  lisinopril (ZESTRIL) 20 MG tablet TAKE ONE TABLET BY MOUTH EVERY MORNING 09/23/20  Yes Copland, Gay Filler, MD  metFORMIN (GLUCOPHAGE) 500 MG tablet TAKE TWO TABLETS BY MOUTH EVERY MORNING and TAKE TWO TABLETS BY MOUTH EVERY EVENING 09/23/20  Yes Copland, Gay Filler, MD  metoprolol tartrate (LOPRESSOR) 25 MG tablet Take 0.5 tablets (12.5 mg total) by mouth 2 (two) times daily. 09/15/20  Yes Copland, Gay Filler, MD  Multiple Vitamins-Minerals (ADULT GUMMY PO) Take 2 capsules by mouth daily.   Yes [provider]  VITAMIN D PO Take 1 capsule  by mouth daily.   Yes [provider]  Blood Glucose Monitoring Suppl (ONE TOUCH ULTRA 2) w/Device KIT Use to check glucose up to twice daily. E11.9 dx code 10/29/19   Copland, Gay Filler, MD  glucose blood (ONETOUCH ULTRA) test strip Use to check glucose up to twice daily. E11.9 dx code 10/29/19   Copland, Gay Filler, MD  glucose blood (ONETOUCH VERIO) test strip 1 each by Other route daily. Use as instructed    [provider]  ipratropium (ATROVENT) 0.03 % nasal spray Place 1 spray into the nose every morning. Patient not taking: Reported on 12/31/2020 10/20/20   Copland, Gay Filler, MD  OneTouch Delica Lancets 62G MISC Use to check glucose up to twice daily. E11.9 dx code 10/29/19   Copland, Gay Filler, MD     Family History  Problem Relation Age of Onset   Heart disease Mother        in her 56s, heart attack   Diabetes Mother    Kidney disease Mother  dialysis   Thyroid disease Mother    Aneurysm Mother    Ovarian cancer Mother    Breast cancer Mother    Thyroid disease Sister    Heart disease Sister    Lymphoma Father    Diabetes Father    Hypertension Father    Congestive Heart Failure Father    Thyroid disease Brother    Thyroid disease Paternal Grandmother    Heart attack Sister        at age 69   Stroke Sister    Hypertension Sister    Breast cancer Maternal Aunt    Breast cancer Maternal Grandmother    Breast cancer Maternal Aunt    Breast cancer Maternal Aunt    Other Brother        Car accident   Bipolar disorder Son    Other Son        GSW    Social History   Socioeconomic History   Marital status: Widowed    Spouse name: Not on file   Number of children: 4   Years of education: 16   Highest education level: Bachelor's degree (e.g., BA, AB, BS)  Occupational History   Occupation: LPN - retired  Tobacco Use   Smoking status: Never    Passive exposure: Never   Smokeless tobacco: Never  Vaping Use   Vaping Use: Never used   Substance and Sexual Activity   Alcohol use: No   Drug use: No   Sexual activity: Not Currently  Other Topics Concern   Not on file  Social History Narrative   Not on file   Social Determinants of Health   Financial Resource Strain: Low Risk    Difficulty of Paying Living Expenses: Not hard at all  Food Insecurity: No Food Insecurity   Worried About Charity fundraiser in the Last Year: Never true   [place] in the Last Year: Never true  Transportation Needs: No Transportation Needs   Lack of Transportation (Medical): No   Lack of Transportation (Non-Medical): No  Physical Activity: Insufficiently Active   Days of Exercise per Week: 3 days   Minutes of Exercise per Session: 30 min  Stress: Stress Concern Present   Feeling of Stress : Rather much  Social Connections: Moderately Integrated   Frequency of Communication with Friends and Family: More than three times a week   Frequency of Social Gatherings with Friends and Family: More than three times a week   Attends Religious Services: More than 4 times per year   Active Member of [employer] or Organizations: Yes   Attends Archivist Meetings: 1 to 4 times per year   Marital Status: Widowed     Review of Systems: A 12 point ROS discussed and pertinent positives are indicated in the HPI above.  All other systems are negative.  Review of Systems  Constitutional:  Negative for appetite change, chills and fever.  Respiratory:  Negative for cough and shortness of breath.   Cardiovascular:  Negative for chest pain.  Gastrointestinal:  Negative for abdominal pain, nausea and vomiting.  Musculoskeletal:  Negative for back pain.  Neurological:  Positive for numbness (bilateral hands 2/2 carpal tunnel) and headaches (almost daily, dull, all over head). Negative for dizziness, tremors, seizures, syncope, facial asymmetry, speech difficulty, weakness and light-headedness.   Vital Signs: BP 131/61   Pulse (!) 56   Temp  97.9 F (36.6 C) (Oral)   Resp 15   Ht 5'  1" (1.549 m)   Wt 180 lb (81.6 kg)   SpO2 100%   BMI 34.01 kg/m   Physical Exam Vitals reviewed.  Constitutional:      General: She is not in acute distress. HENT:     Head: Normocephalic.     Mouth/Throat:     Mouth: Mucous membranes are moist.     Pharynx: Oropharynx is clear. No oropharyngeal exudate or posterior oropharyngeal erythema.  Cardiovascular:     Rate and Rhythm: Normal rate and regular rhythm.  Pulmonary:     Effort: Pulmonary effort is normal.     Breath sounds: Normal breath sounds.  Abdominal:     General: There is no distension.     Palpations: Abdomen is soft.     Tenderness: There is no abdominal tenderness.  Skin:    General: Skin is warm and dry.  Neurological:     Mental Status: She is alert and oriented to person, place, and time.  Psychiatric:        Mood and Affect: Mood normal.        Behavior: Behavior normal.        Thought Content: Thought content normal.        Judgment: Judgment normal.     MD Evaluation Airway: WNL Heart: WNL Abdomen: WNL Chest/ Lungs: WNL ASA  Classification: 3   Imaging: MR ANGIO HEAD WO CONTRAST  Result Date: 12/18/2020 CLINICAL DATA:  [place] for some reason training article the follow-up of known aneurysm EXAM: MRA HEAD WITHOUT CONTRAST TECHNIQUE: Angiographic images of the Circle of Willis were acquired using MRA technique without intravenous contrast. 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 Focal stenosis at the left ICA terminus (series 3, image 75), which is new from the prior exam. Internal carotid arteries are otherwise patent. 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 Normal right A1. Normal anterior communicating artery. Anterior cerebral arteries are patent to their  distal aspects. Increased narrowing of the right M1 (series 3, image 84). Normal MCA bifurcations. Distal MCA branches perfused and symmetric. Posterior circulation: Diminutive left vertebral artery is relatively unchanged in caliber compared to 2021 but is decreased in signal; this artery has decreased in size compared to 2019. Right vertebral artery is widely patent to the vertebrobasilar junction without stenosis. Basilar patent to its distal aspect. Superior cerebral arteries patent bilaterally. Fetal origin of the left PCA. PCAs well perfused to their distal aspects without stenosis. Anatomic variants: None significant Other: None. IMPRESSION: 1. 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 2. Unchanged caliber of the distal left vertebral artery compared to 2021, with decreased signal. 3. Increased focal stenosis at the left ICA terminus. 4. Increased narrowing of the right M1. Electronically Signed   By: Merilyn Baba M.D.   On: 12/18/2020 14:26    Labs:  CBC: Recent Labs    06/02/20 0933 07/24/20 1338  WBC 6.1 6.7  HGB 14.2 13.4  HCT 43.0 43.3  PLT 289.0 280    COAGS: No results for input(s): INR, APTT in the last 8760 hours.  BMP: Recent Labs    06/02/20 0933 07/24/20 1338  NA 141 139  K 4.2 3.6  CL 103 104  CO2 29 26  GLUCOSE 111* 79  BUN 18 21  CALCIUM 9.6 9.0  CREATININE 0.82 0.71  GFRNONAA  --  >60    LIVER FUNCTION TESTS: Recent Labs    06/02/20 0933  BILITOT 0.7  AST 15  ALT 12  ALKPHOS 74  PROT 6.8  ALBUMIN 4.0    TUMOR MARKERS: No results for input(s): AFPTM, CEA, CA199, CHROMGRNA in the last 8760 hours.  Assessment and Plan:  70 y/o F with history of TIA (2016, 2017), multiple intracranial stenoses and right cavernous ICA aneurysm who presents today for a diagnostic cerebral angiogram with moderate sedation to assess recent MRA findings.  Risks and  benefits of cerebral angiogram were discussed with the patient including, but not limited to bleeding, infection, vascular injury, stroke, or contrast induced renal failure.  This interventional procedure involves the use of X-rays and because of the nature of the planned procedure, it is possible that we will have prolonged use of X-ray fluoroscopy. Potential radiation risks to you include (but are not limited to) the following: - A slightly elevated risk for cancer  several years later in life. This risk is typically less than 0.5% percent. This risk is low in comparison to the normal incidence of human cancer, which is 33% for women and 50% for men according to the [place]. - Radiation induced injury can include skin redness, resembling a rash, tissue breakdown / ulcers and hair loss (which can be temporary or permanent).  The likelihood of either of these occurring depends on the difficulty of the procedure and whether you are sensitive to radiation due to previous procedures, disease, or genetic conditions.  IF your procedure requires a prolonged use of radiation, you will be notified and given written instructions for further action.  It is your responsibility to monitor the irradiated area for the 2 weeks following the procedure and to notify your physician if you are concerned that you have suffered a radiation induced injury.    All of the patient's questions were answered, patient is agreeable to proceed.  Consent signed and in chart.  Thank you for this interesting consult.  I greatly enjoyed meeting BRIANNIA LABA and look forward to participating in their care.  A copy of this report was sent to the requesting provider on this date.  Electronically Signed: Joaquim Nam, PA-C 01/06/2021, 10:43 AM   I spent a total of 30 Minutes  in face to face in clinical consultation, greater than 50% of which was counseling/coordinating care for diagnostic cerebral  angiogram.

## 2021-01-06 NOTE — Sedation Documentation (Signed)
Pt transported to short stay room 19 via stretcher accompanied by RN. Melanie RN at bedside to receive pt. Handoff completed. Right wrist remains level 0 with TR band in place. Pt awake alert and oriented. No s/s of distress at this time.

## 2021-01-06 NOTE — Progress Notes (Signed)
States no further headache

## 2021-01-06 NOTE — Progress Notes (Signed)
States headache is worse again and states when moves head it hurts worse and pain behind right eye; Garguilo [place] ,[place] notified and will be in

## 2021-01-06 NOTE — Progress Notes (Addendum)
Jennifer,PA in and order noted and per Jennifer,PA pain med given and ok to d/c home

## 2021-01-06 NOTE — Procedures (Signed)
S/P bilateral common carotid and Rt VA angiograms. RT rad approach . Findings. 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 MIld bilateral MCA M1 stenosis. Mod to severe RT PCA distal P1 seg stenosis. S.Mariella Blackwelder MD

## 2021-01-06 NOTE — Progress Notes (Signed)
Client states headache is worse 8/10 and shannon waterson,pa notified; client states she told Dr Estanislado Pandy about headache and states "It feels like my brain is on fire"; shannon pa notified; order noted

## 2021-01-06 NOTE — Progress Notes (Signed)
Per Forde Radon client may resume plavix tomorrow

## 2021-01-06 NOTE — Discharge Instructions (Signed)
No Metformin for 2 days; may resume plavix tomorrow

## 2021-01-07 ENCOUNTER — Ambulatory Visit: Payer: Medicare Other | Admitting: *Deleted

## 2021-01-07 DIAGNOSIS — F320 Major depressive disorder, single episode, mild: Secondary | ICD-10-CM

## 2021-01-07 DIAGNOSIS — F32A Depression, unspecified: Secondary | ICD-10-CM

## 2021-01-07 DIAGNOSIS — G459 Transient cerebral ischemic attack, unspecified: Secondary | ICD-10-CM

## 2021-01-07 DIAGNOSIS — E1159 Type 2 diabetes mellitus with other circulatory complications: Secondary | ICD-10-CM

## 2021-01-07 DIAGNOSIS — G629 Polyneuropathy, unspecified: Secondary | ICD-10-CM

## 2021-01-07 DIAGNOSIS — I10 Essential (primary) hypertension: Secondary | ICD-10-CM

## 2021-01-07 DIAGNOSIS — E118 Type 2 diabetes mellitus with unspecified complications: Secondary | ICD-10-CM

## 2021-01-07 DIAGNOSIS — I729 Aneurysm of unspecified site: Secondary | ICD-10-CM

## 2021-01-07 DIAGNOSIS — F411 Generalized anxiety disorder: Secondary | ICD-10-CM

## 2021-01-07 DIAGNOSIS — H8109 Meniere's disease, unspecified ear: Secondary | ICD-10-CM

## 2021-01-07 DIAGNOSIS — R609 Edema, unspecified: Secondary | ICD-10-CM

## 2021-01-08 NOTE — Patient Instructions (Signed)
Visit Information  Thank you for taking time to visit with me today. Please don't hesitate to contact me if I can be of assistance to you before our next scheduled telephone appointment.  Following are the goals we discussed today:   Review and implementation of "9 Key Components When Dealing with Difficult Family Members". Patient Goals/Self-Care Activities: Continue to receive personal counseling with LCSW, on a bi-weekly basis, to reduce and manage symptoms of Anxiety and Depression, until manageable.   Please report any noticeable changes (I.e. headaches, bilateral blurred vision, memory loss, etc.) to Dr. Luanne Bras, Radiologist with [facility] Radiology Department, after having recently undergone a Diagnostic Cerebral Angiogram on 01/06/2021.  Remember these "9 Key Components When Dealing with Difficult Family Members", especially during the holidays:  # 1.  Get the timing right.  # 2.  Give yourself all the attention.  # 3.  Communicate clearly.  # 4.  Give up the gaming.  # 5.  Get big on boundaries.  # 6.  Listen like it's the first time.  # 7.  Manage it with mindfulness.  # 8.  Switch perspective.  # 9.  When all else fails, accept. Contact LCSW directly (# [id number]) if you have questions, need assistance, or if additional social work needs are identified between now and our next scheduled telephone outreach call.  Our next appointment is by telephone on 01/22/2021 at 9:45 am.  Please call the care guide team at [phone] if you need to cancel or reschedule your appointment.   If you are experiencing a Mental Health or [place] or need someone to talk to, please call the Suicide and Crisis Lifeline: 988 call the [place] National Suicide Prevention Lifeline: 807-862-0527 or TTY: 747-117-6022 TTY 6785646877) to talk to a trained counselor call 1-800-273-TALK (toll free, 24 hour hotline) go to [facility] [address] [place] [phone]) call the [place]: [phone] call 911   Patient verbalizes understanding of instructions provided today and agrees to view in [place].   Cole Licensed Clinical Social Worker [place] [phone]

## 2021-01-08 NOTE — Chronic Care Management (AMB) (Signed)
Chronic Care Management    Clinical Social Work Note  01/08/2021 Name: Sherry [name] MRN: [id number] DOB: 09/06/1950  Sherry [name] is a 70 y.o. year old female who is a primary care patient of Copland, Gay Filler, MD. The CCM team was consulted to assist the patient with chronic disease management and/or care coordination needs related to: [employer] and [employer].   Engaged with patient by telephone for follow up visit in response to provider referral for social work chronic care management and care coordination services.   Consent to Services:  The patient was given information about Chronic Care Management services, agreed to services, and gave verbal consent prior to initiation of services.  Please see initial visit note for detailed documentation.   Patient agreed to services and consent obtained.   Assessment: Review of patient past medical history, allergies, medications, and health status, including review of relevant consultants reports was performed today as part of a comprehensive evaluation and provision of chronic care management and care coordination services.     SDOH (Social Determinants of Health) assessments and interventions performed:    Advanced Directives Status: Not addressed in this encounter.  CCM Care Plan  Allergies  Allergen Reactions   Influenza Vaccines Anaphylaxis   Atorvastatin Other (See Comments)    Joint pain / myalgias   Demerol Other (See Comments)    SEIZURE   Nsaids Other (See Comments)    NAPROXEN, IBUPROFEN, SKELAXIN---  CAUSES PALPITATIONS   Pravastatin Other (See Comments)    Myalgias    Prednisone Other (See Comments)    Caused her glucose to go up to 500 and went to ED   Rosuvastatin Other (See Comments)    Joint stiffness and muscle pain   Roxicodone [Oxycodone] Itching and Other (See Comments)    Hallucinations and itching   Tramadol Other (See Comments)    Contraindicated with  Victoza    Codeine Itching   Mango Flavor [Flavoring Agent] Itching    Outpatient Encounter Medications as of 01/07/2021  Medication Sig   albuterol (VENTOLIN HFA) 108 (90 Base) MCG/ACT inhaler Inhale 2 puffs into the lungs every 6 (six) hours as needed for wheezing or shortness of breath.   Blood Glucose Monitoring Suppl (ONE TOUCH ULTRA 2) w/Device KIT Use to check glucose up to twice daily. E11.9 dx code   cetirizine (ZYRTEC) 10 MG tablet Take 1 tablet (10 mg total) by mouth daily.   clopidogrel (PLAVIX) 75 MG tablet TAKE ONE TABLET BY MOUTH EVERY MORNING   empagliflozin (JARDIANCE) 10 MG TABS tablet Take 1 tablet (10 mg total) by mouth daily. (Patient taking differently: Take 5 mg by mouth daily.)   famotidine (PEPCID) 20 MG tablet Take 20 mg by mouth daily.    fluticasone (FLONASE) 50 MCG/ACT nasal spray Place 2 sprays into both nostrils daily.   fluticasone (FLOVENT HFA) 220 MCG/ACT inhaler INHALE 1 PUFF BY MOUTH INTO LUNGS TWICE DAILY. MAY INCREASE TO TWO PUFFS IF needed (Patient taking differently: Inhale 2 puffs into the lungs 2 (two) times daily as needed (shortness of breath).)   furosemide (LASIX) 20 MG tablet TAKE ONE TABLET BY MOUTH EVERY MORNING   gabapentin (NEURONTIN) 300 MG capsule TAKE TWO CAPSULES BY MOUTH EVERY MORNING and TAKE TWO CAPSULES BY MOUTH EVERYDAY AT BEDTIME   glucose blood (ONETOUCH ULTRA) test strip Use to check glucose up to twice daily. E11.9 dx code   glucose blood (ONETOUCH VERIO) test strip 1 each by Other  route daily. Use as instructed   ipratropium (ATROVENT) 0.03 % nasal spray Place 1 spray into the nose every morning. (Patient not taking: Reported on 12/31/2020)   linagliptin (TRADJENTA) 5 MG TABS tablet Take 0.5 tablets (2.5 mg total) by mouth daily. TAKE 1/2 TABLET BY MOUTH DAILY *NEED OFFICE VISIT*   lisinopril (ZESTRIL) 20 MG tablet TAKE ONE TABLET BY MOUTH EVERY MORNING   metFORMIN (GLUCOPHAGE) 500 MG tablet TAKE TWO TABLETS BY MOUTH EVERY MORNING  and TAKE TWO TABLETS BY MOUTH EVERY EVENING   metoprolol tartrate (LOPRESSOR) 25 MG tablet Take 0.5 tablets (12.5 mg total) by mouth 2 (two) times daily.   Multiple Vitamins-Minerals (ADULT GUMMY PO) Take 2 capsules by mouth daily.   OneTouch Delica Lancets 38B MISC Use to check glucose up to twice daily. E11.9 dx code   VITAMIN D PO Take 1 capsule by mouth daily.   No facility-administered encounter medications on file as of 01/07/2021.    Patient Active Problem List   Diagnosis Date Noted   Traumatic hematoma of left lower leg 09/26/2018   S/P total knee arthroplasty, left 03/29/2017   Spondylosis without myelopathy or radiculopathy, cervical region 03/29/2017   Bilateral carpal tunnel syndrome 03/29/2017   Acute asthma exacerbation 08/19/2016   Cervicalgia 03/23/2016   Vertigo 04/14/2015   Anxiety state 01/22/2015   HLD (hyperlipidemia) 01/22/2015   Type 2 diabetes mellitus with circulatory disorder ([place]) 01/22/2015   Intracranial vascular stenosis 01/22/2015   Aneurysm ([place])    Headache    Essential hypertension 11/12/2014   Dependent edema 11/12/2014   Peripheral neuropathy 11/12/2014   Depression 11/12/2014   Facial droop    TIA (transient ischemic attack) 11/11/2014   Hyperthyroidism 08/06/2014   Meniere's disease 06/07/2014   Goiter 06/13/2013   Obesity, unspecified 06/13/2013    Conditions to be addressed/monitored: Anxiety and Depression.  Limited Social Support, Mental Health Concerns, Family and Relationship Dysfunction, Social Isolation, Limited Access to Caregiver, and Lacks Knowledge of [employer].  Care Plan : LCSW Plan of Care  Updates made by Francis Gaines, LCSW since 01/08/2021 12:00 AM     Problem: Reduce and Manage My Symptoms of Anxiety and Depression.   Priority: High     Long-Range Goal: Reduce and Manage My Symptoms of Anxiety and Depression.   Start Date: 12/03/2020  Expected End Date: 03/05/2021  This Visit's Progress: On track   Recent Progress: On track  Priority: High  Note:   Current Barriers:   Acute Mental Health needs related to Major Depressive Disorder, Single Episode, Mild, Transient Ischemic Attack, Hypertension, Aneurysm, Type II Diabetes Mellitus with Circulatory Disorder, Anxiety State and Family Discord requires Support, Education, Resources, Referrals and Care Coordination in order to meet unmet mental health needs. Clinical Goal(s):  Patient will work with LCSW to reduce and manage symptoms of Anxiety and Depression.  Patient will increase knowledge and/or ability of:  Coping Skills, Healthy Habits, Self-Management Skills, Stress Reduction, Home Safety and Utilizing [employer] and Resources.   Clinical Interventions:  Collaboration with Primary Care Physician, Dr. Janett Billow Copland regarding development and update of comprehensive plan of care as evidenced by provider attestation and co-signature. Inter-disciplinary care team collaboration (see longitudinal plan of care). Review and implementation of "9 Key Components When Dealing with Difficult Family Members". Patient Goals/Self-Care Activities: Continue to receive personal counseling with LCSW, on a bi-weekly basis, to reduce and manage symptoms of Anxiety and Depression, until manageable.   Please report any noticeable changes (I.e. headaches, bilateral  blurred vision, memory loss, etc.) to Dr. Luanne Bras, Radiologist with [facility] Radiology Department, after having recently undergone a Diagnostic Cerebral Angiogram on 01/06/2021.  Remember these "9 Key Components When Dealing with Difficult Family Members", especially during the holidays:  # 1.  Get the timing right.  # 2.  Give yourself all the attention.  # 3.  Communicate clearly.  # 4.  Give up the gaming.  # 5.  Get big on boundaries.  # 6.  Listen like it's the first time.  # 7.  Manage it with mindfulness.  # 8.  Switch perspective.  # 9.  When all else fails,  accept. Contact LCSW directly (# [id number]) if you have questions, need assistance, or if additional social work needs are identified between now and our next scheduled telephone outreach call. Follow-Up:  01/22/2021 at 9:45 am     [place] Clinical Social Worker [place] [phone]

## 2021-01-22 ENCOUNTER — Ambulatory Visit (INDEPENDENT_AMBULATORY_CARE_PROVIDER_SITE_OTHER): Payer: Medicare Other | Admitting: *Deleted

## 2021-01-22 DIAGNOSIS — I729 Aneurysm of unspecified site: Secondary | ICD-10-CM

## 2021-01-22 DIAGNOSIS — F320 Major depressive disorder, single episode, mild: Secondary | ICD-10-CM

## 2021-01-22 DIAGNOSIS — F411 Generalized anxiety disorder: Secondary | ICD-10-CM

## 2021-01-22 DIAGNOSIS — G459 Transient cerebral ischemic attack, unspecified: Secondary | ICD-10-CM

## 2021-01-22 DIAGNOSIS — G629 Polyneuropathy, unspecified: Secondary | ICD-10-CM

## 2021-01-22 DIAGNOSIS — F32A Depression, unspecified: Secondary | ICD-10-CM

## 2021-01-22 DIAGNOSIS — E1159 Type 2 diabetes mellitus with other circulatory complications: Secondary | ICD-10-CM

## 2021-01-22 DIAGNOSIS — E118 Type 2 diabetes mellitus with unspecified complications: Secondary | ICD-10-CM

## 2021-01-22 DIAGNOSIS — H8109 Meniere's disease, unspecified ear: Secondary | ICD-10-CM

## 2021-01-22 NOTE — Chronic Care Management (AMB) (Signed)
Chronic Care Management    Clinical Social Work Note  01/22/2021 Name: Sherry [name] MRN: [id number] DOB: [birth year]  Sherry [name] is a 70 y.o. year old female who is a primary care patient of Copland, Gay Filler, MD. The CCM team was consulted to assist the patient with chronic disease management and/or care coordination needs related to: [employer], Family and Relationship Dysfunction, and Mental Health Counseling and Resources.   Engaged with patient by telephone for follow up visit in response to provider referral for social work chronic care management and care coordination services.   Consent to Services:  The patient was given information about Chronic Care Management services, agreed to services, and gave verbal consent prior to initiation of services.  Please see initial visit note for detailed documentation.   Patient agreed to services and consent obtained.   Assessment: Review of patient past medical history, allergies, medications, and health status, including review of relevant consultants reports was performed today as part of a comprehensive evaluation and provision of chronic care management and care coordination services.     SDOH (Social Determinants of Health) assessments and interventions performed:    Advanced Directives Status: Not addressed in this encounter.  CCM Care Plan  Allergies  Allergen Reactions   Influenza Vaccines Anaphylaxis   Atorvastatin Other (See Comments)    Joint pain / myalgias   Demerol Other (See Comments)    SEIZURE   Nsaids Other (See Comments)    NAPROXEN, IBUPROFEN, SKELAXIN---  CAUSES PALPITATIONS   Pravastatin Other (See Comments)    Myalgias    Prednisone Other (See Comments)    Caused her glucose to go up to 500 and went to ED   Rosuvastatin Other (See Comments)    Joint stiffness and muscle pain   Roxicodone [Oxycodone] Itching and Other (See Comments)    Hallucinations and itching   Tramadol Other (See  Comments)    Contraindicated with Victoza    Codeine Itching   Mango Flavor [Flavoring Agent] Itching    Outpatient Encounter Medications as of 01/22/2021  Medication Sig   albuterol (VENTOLIN HFA) 108 (90 Base) MCG/ACT inhaler Inhale 2 puffs into the lungs every 6 (six) hours as needed for wheezing or shortness of breath.   Blood Glucose Monitoring Suppl (ONE TOUCH ULTRA 2) w/Device KIT Use to check glucose up to twice daily. E11.9 dx code   cetirizine (ZYRTEC) 10 MG tablet Take 1 tablet (10 mg total) by mouth daily.   clopidogrel (PLAVIX) 75 MG tablet TAKE ONE TABLET BY MOUTH EVERY MORNING   empagliflozin (JARDIANCE) 10 MG TABS tablet Take 1 tablet (10 mg total) by mouth daily. (Patient taking differently: Take 5 mg by mouth daily.)   famotidine (PEPCID) 20 MG tablet Take 20 mg by mouth daily.    fluticasone (FLONASE) 50 MCG/ACT nasal spray Place 2 sprays into both nostrils daily.   fluticasone (FLOVENT HFA) 220 MCG/ACT inhaler INHALE 1 PUFF BY MOUTH INTO LUNGS TWICE DAILY. MAY INCREASE TO TWO PUFFS IF needed (Patient taking differently: Inhale 2 puffs into the lungs 2 (two) times daily as needed (shortness of breath).)   furosemide (LASIX) 20 MG tablet TAKE ONE TABLET BY MOUTH EVERY MORNING   gabapentin (NEURONTIN) 300 MG capsule TAKE TWO CAPSULES BY MOUTH EVERY MORNING and TAKE TWO CAPSULES BY MOUTH EVERYDAY AT BEDTIME   glucose blood (ONETOUCH ULTRA) test strip Use to check glucose up to twice daily. E11.9 dx code   glucose blood (ONETOUCH VERIO) test strip  1 each by Other route daily. Use as instructed   ipratropium (ATROVENT) 0.03 % nasal spray Place 1 spray into the nose every morning. (Patient not taking: Reported on 12/31/2020)   linagliptin (TRADJENTA) 5 MG TABS tablet Take 0.5 tablets (2.5 mg total) by mouth daily. TAKE 1/2 TABLET BY MOUTH DAILY *NEED OFFICE VISIT*   lisinopril (ZESTRIL) 20 MG tablet TAKE ONE TABLET BY MOUTH EVERY MORNING   metFORMIN (GLUCOPHAGE) 500 MG tablet TAKE  TWO TABLETS BY MOUTH EVERY MORNING and TAKE TWO TABLETS BY MOUTH EVERY EVENING   metoprolol tartrate (LOPRESSOR) 25 MG tablet Take 0.5 tablets (12.5 mg total) by mouth 2 (two) times daily.   Multiple Vitamins-Minerals (ADULT GUMMY PO) Take 2 capsules by mouth daily.   OneTouch Delica Lancets 63F MISC Use to check glucose up to twice daily. E11.9 dx code   VITAMIN D PO Take 1 capsule by mouth daily.   No facility-administered encounter medications on file as of 01/22/2021.    Patient Active Problem List   Diagnosis Date Noted   Traumatic hematoma of left lower leg 09/26/2018   S/P total knee arthroplasty, left 03/29/2017   Spondylosis without myelopathy or radiculopathy, cervical region 03/29/2017   Bilateral carpal tunnel syndrome 03/29/2017   Acute asthma exacerbation 08/19/2016   Cervicalgia 03/23/2016   Vertigo 04/14/2015   Anxiety state 01/22/2015   HLD (hyperlipidemia) 01/22/2015   Type 2 diabetes mellitus with circulatory disorder ([place]) 01/22/2015   Intracranial vascular stenosis 01/22/2015   Aneurysm ([place])    Headache    Essential hypertension 11/12/2014   Dependent edema 11/12/2014   Peripheral neuropathy 11/12/2014   Depression 11/12/2014   Facial droop    TIA (transient ischemic attack) 11/11/2014   Hyperthyroidism 08/06/2014   Meniere's disease 06/07/2014   Goiter 06/13/2013   Obesity, unspecified 06/13/2013    Conditions to be addressed/monitored: HTN and Depression.  Limited Social Support, Mental Health Concerns, Family and Relationship Dysfunction, Social Isolation, and Lacks Knowledge of [employer].  Care Plan : LCSW Plan of Care  Updates made by Francis Gaines, LCSW since 01/22/2021 12:00 AM     Problem: Reduce and Manage My Symptoms of Anxiety and Depression.   Priority: High     Long-Range Goal: Reduce and Manage My Symptoms of Anxiety and Depression.   Start Date: 12/03/2020  Expected End Date: 02/23/2021  This Visit's Progress: On  track  Recent Progress: On track  Priority: High  Note:   Current Barriers:   Acute Mental Health needs related to Major Depressive Disorder, Single Episode, Mild, Transient Ischemic Attack, Hypertension, Aneurysm, Type II Diabetes Mellitus with Circulatory Disorder, Anxiety State and Family Discord requires Support, Education, Resources, Referrals and Care Coordination in order to meet unmet mental health needs. Clinical Goal(s):  Patient will work with LCSW to reduce and manage symptoms of Anxiety and Depression.  Patient will increase knowledge and/or ability of:  Coping Skills, Healthy Habits, Self-Management Skills, Stress Reduction, Home Safety and Utilizing [employer] and Resources.   Clinical Interventions:  Collaboration with Primary Care Physician, Dr. Janett Billow Copland regarding development and update of comprehensive plan of care as evidenced by provider attestation and co-signature. Inter-disciplinary care team collaboration (see longitudinal plan of care). Patient Goals/Self-Care Activities: Continue to receive personal counseling with LCSW, on a bi-weekly basis, to reduce and manage symptoms of Anxiety and Depression, until manageable.  Continued review of "9 Key Components When Dealing with Difficult Family Members":  # 1.  Get the timing right. ~  The only truly productive form of interaction is charge-free  -- not coming from a place of anger or upset.  If the environment is stressful and you are emotional, then the timing is not right for confrontation or difficult discussions.  # 2.  Give yourself all the attention. ~  Sometimes the best thing we can do is step back and put our attention on the one thing we do have some control over - ourselves.  Then we actually have energy to deal with difficult people better.   # 3.  Communicate clearly. ~  Despite always being around family, we can communicate with them less than we do with people we just met.  We assume family  know what we are thinking or want, or that other family members have told them.  Try not to make assumptions. # 4.  Give up the gaming. ~  Before you claim you never play games, consider if you are guilty of any of the following communication tricks: A)  You pull the past into every discussion (you said, you did)? B)  You pull other people who are not even there into it (well he/she/everyone else agrees that)? C)  You start discussions with other people present, then make them get involved? D)  You make it a competition (I know I am right)? # 5.  Get big on boundaries. ~  Boundaries need to be:  A)  Set clearly. B)  Used in simple language. C)  Said directly to the person in question. D)  Shared at a calm moment when everyone is able to listen. E)  Repeated until heard.  # 6.  Listen like it's the first time. ~  When it comes to family, we can assume we know what they are going to say.  We actually practice selective listening.  Our brains seek proof that we are right about the other person, then blocks out the rest.   ~  When we dont listen to someone, we are communicating to them that what they have to say is not worthwhile, that we are better than them, that we will never give them a chance.  ~  Properly and fully listening to someone is perhaps one of the most transformational relationship tools.  # 7.  Manage it with mindfulness. ~  Mindfulness is an incredible tool when we feel trapped by a situation.   ~  Mindfulness helps you lay aside your upset about the past.   ~  You can stop panicking about what is next and just deal with the here and now.   # 8.  Switch perspective.  ~  Attempting to see another persons perspective does not mean that we suddenly agree with them, but that we can stop taking things so personally.   # 9.  When all else fails, accept. ~  Acceptance is not about letting the other person get away with things.  Instead, acceptance is about realizing that the  person losing out the most, in your desire to bring the family member to justice, or force peace between you, is you.  ~  You will eventually lose the energy, your mental well-being, and often the respect of other family members.  ~  What would it feel like if, even for just one day, you accepted that the other person would never see your viewpoint, never change?  Or, that you would never be close to that family member again?   ~  Would you be  okay with that?   ~  How much of a relief would you feel to give up the battle?  ~  What other things could you do with that energy instead? Contact LCSW directly (# [id number]) if you have questions, need assistance, or if additional social work needs are identified between now and our next scheduled telephone outreach call. Follow-Up:  02/23/2021 at 10:45 am     [place] Clinical Social Worker [place] [phone]

## 2021-01-22 NOTE — Patient Instructions (Signed)
Visit Information  Thank you for taking time to visit with me today. Please don't hesitate to contact me if I can be of assistance to you before our next scheduled telephone appointment.  Following are the goals we discussed today:   Patient Goals/Self-Care Activities: Continue to receive personal counseling with LCSW, on a bi-weekly basis, to reduce and manage symptoms of Anxiety and Depression, until manageable.  Continued review of "9 Key Components When Dealing with Difficult Family Members":  # 1.  Get the timing right. ~  The only truly productive form of interaction is charge-free  -- not coming from a place of anger or upset.  If the environment is stressful and you are emotional, then the timing is not right for confrontation or difficult discussions.  # 2.  Give yourself all the attention. ~  Sometimes the best thing we can do is step back and put our attention on the one thing we do have some control over - ourselves.  Then we actually have energy to deal with difficult people better.   # 3.  Communicate clearly. ~  Despite always being around family, we can communicate with them less than we do with people we just met.  We assume family know what we are thinking or want, or that other family members have told them.  Try not to make assumptions. # 4.  Give up the gaming. ~  Before you claim you never play games, consider if you are guilty of any of the following communication tricks: A)  You pull the past into every discussion (you said, you did)? B)  You pull other people who are not even there into it (well he/she/everyone else agrees that)? C)  You start discussions with other people present, then make them get involved? D)  You make it a competition (I know I am right)? # 5.  Get big on boundaries. ~  Boundaries need to be:  A)  Set clearly. B)  Used in simple language. C)  Said directly to the person in question. D)  Shared at a calm moment when everyone is able to  listen. E)  Repeated until heard.  # 6.  Listen like it's the first time. ~  When it comes to family, we can assume we know what they are going to say.  We actually practice selective listening.  Our brains seek proof that we are right about the other person, then blocks out the rest.   ~  When we dont listen to someone, we are communicating to them that what they have to say is not worthwhile, that we are better than them, that we will never give them a chance.  ~  Properly and fully listening to someone is perhaps one of the most transformational relationship tools.  # 7.  Manage it with mindfulness. ~  Mindfulness is an incredible tool when we feel trapped by a situation.   ~  Mindfulness helps you lay aside your upset about the past.   ~  You can stop panicking about what is next and just deal with the here and now.   # 8.  Switch perspective.  ~  Attempting to see another persons perspective does not mean that we suddenly agree with them, but that we can stop taking things so personally.   # 9.  When all else fails, accept. ~  Acceptance is not about letting the other person get away with things.  Instead, acceptance is about realizing that  the person losing out the most, in your desire to bring the family member to justice, or force peace between you, is you.  ~  You will eventually lose the energy, your mental well-being, and often the respect of other family members.  ~  What would it feel like if, even for just one day, you accepted that the other person would never see your viewpoint, never change?  Or, that you would never be close to that family member again?   ~  Would you be okay with that?   ~  How much of a relief would you feel to give up the battle?  ~  What other things could you do with that energy instead? Contact LCSW directly (# [id number]) if you have questions, need assistance, or if additional social work needs are identified between now and our next scheduled  telephone outreach call.  Follow-Up:  02/23/2021 at 10:45 am  Please call the care guide team at [phone] if you need to cancel or reschedule your appointment.   If you are experiencing a Mental Health or [place] or need someone to talk to, please call the Suicide and Crisis Lifeline: 988 call the [place] National Suicide Prevention Lifeline: 415 317 0310 or TTY: 854-715-1626 TTY 4357769894) to talk to a trained counselor call 1-800-273-TALK (toll free, 24 hour hotline) go to [facility] [address], [place] [phone]) call the [place]: [phone] call 911   Patient verbalizes understanding of instructions provided today and agrees to view in [place].   [place] Licensed Clinical Social Worker [place] [phone]

## 2021-02-07 DIAGNOSIS — E1159 Type 2 diabetes mellitus with other circulatory complications: Secondary | ICD-10-CM

## 2021-02-07 DIAGNOSIS — G459 Transient cerebral ischemic attack, unspecified: Secondary | ICD-10-CM

## 2021-02-07 DIAGNOSIS — F32A Depression, unspecified: Secondary | ICD-10-CM | POA: Diagnosis not present

## 2021-02-07 DIAGNOSIS — F320 Major depressive disorder, single episode, mild: Secondary | ICD-10-CM

## 2021-02-07 DIAGNOSIS — E118 Type 2 diabetes mellitus with unspecified complications: Secondary | ICD-10-CM | POA: Diagnosis not present

## 2021-02-23 ENCOUNTER — Telehealth: Payer: Self-pay | Admitting: *Deleted

## 2021-02-23 ENCOUNTER — Telehealth: Payer: Medicare Other | Admitting: *Deleted

## 2021-02-23 NOTE — Telephone Encounter (Signed)
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  An unsuccessful telephone outreach was attempted today. The patient was referred to the case management team for assistance with care management and care coordination. A HIPAA compliant message was left on voicemail, providing contact information, encouraging patient to return LCSW's call at her earliest convenience.  LCSW will make a second follow-up telephone outreach call to patient within the next two weeks, if a return call is not received in the meantime.  Follow-Up Plan:  Request placed with Scheduling Care Guide to reschedule patient's follow-up telephone outreach call with LCSW.  [place] Licensed Clinical Social Worker [place] [phone]

## 2021-03-12 ENCOUNTER — Telehealth: Payer: Medicare Other

## 2021-03-13 ENCOUNTER — Ambulatory Visit (INDEPENDENT_AMBULATORY_CARE_PROVIDER_SITE_OTHER): Payer: Medicare Other | Admitting: *Deleted

## 2021-03-13 DIAGNOSIS — I729 Aneurysm of unspecified site: Secondary | ICD-10-CM

## 2021-03-13 DIAGNOSIS — G459 Transient cerebral ischemic attack, unspecified: Secondary | ICD-10-CM

## 2021-03-13 DIAGNOSIS — E1159 Type 2 diabetes mellitus with other circulatory complications: Secondary | ICD-10-CM

## 2021-03-13 DIAGNOSIS — F32A Depression, unspecified: Secondary | ICD-10-CM

## 2021-03-13 DIAGNOSIS — G629 Polyneuropathy, unspecified: Secondary | ICD-10-CM

## 2021-03-13 DIAGNOSIS — F411 Generalized anxiety disorder: Secondary | ICD-10-CM

## 2021-03-13 DIAGNOSIS — Z96652 Presence of left artificial knee joint: Secondary | ICD-10-CM

## 2021-03-13 DIAGNOSIS — E118 Type 2 diabetes mellitus with unspecified complications: Secondary | ICD-10-CM

## 2021-03-13 DIAGNOSIS — F320 Major depressive disorder, single episode, mild: Secondary | ICD-10-CM

## 2021-03-13 DIAGNOSIS — H8109 Meniere's disease, unspecified ear: Secondary | ICD-10-CM

## 2021-03-13 NOTE — Chronic Care Management (AMB) (Signed)
Chronic Care Management    Clinical Social Work Note  03/13/2021 Name: Sherry [name] MRN: [id number] DOB: [birth year]  Sherry [name] is a 71 y.o. year old female who is a primary care patient of Copland, Gay Filler, MD. The CCM team was consulted to assist the patient with chronic disease management and/or care coordination needs related to: Mental Health Counseling and Resources and Grief Counseling.   Engaged with patient by telephone for follow up visit in response to provider referral for social work chronic care management and care coordination services.   Consent to Services:  The patient was given information about Chronic Care Management services, agreed to services, and gave verbal consent prior to initiation of services.  Please see initial visit note for detailed documentation.   Patient agreed to services and consent obtained.   Assessment: Review of patient past medical history, allergies, medications, and health status, including review of relevant consultants reports was performed today as part of a comprehensive evaluation and provision of chronic care management and care coordination services.     SDOH (Social Determinants of Health) assessments and interventions performed:    Advanced Directives Status: Not addressed in this encounter.  CCM Care Plan  Allergies  Allergen Reactions   Influenza Vaccines Anaphylaxis   Atorvastatin Other (See Comments)    Joint pain / myalgias   Demerol Other (See Comments)    SEIZURE   Nsaids Other (See Comments)    NAPROXEN, IBUPROFEN, SKELAXIN---  CAUSES PALPITATIONS   Pravastatin Other (See Comments)    Myalgias    Prednisone Other (See Comments)    Caused her glucose to go up to 500 and went to ED   Rosuvastatin Other (See Comments)    Joint stiffness and muscle pain   Roxicodone [Oxycodone] Itching and Other (See Comments)    Hallucinations and itching   Tramadol Other (See Comments)    Contraindicated with Victoza     Codeine Itching   Mango Flavor [Flavoring Agent] Itching    Outpatient Encounter Medications as of 03/13/2021  Medication Sig   albuterol (VENTOLIN HFA) 108 (90 Base) MCG/ACT inhaler Inhale 2 puffs into the lungs every 6 (six) hours as needed for wheezing or shortness of breath.   Blood Glucose Monitoring Suppl (ONE TOUCH ULTRA 2) w/Device KIT Use to check glucose up to twice daily. E11.9 dx code   cetirizine (ZYRTEC) 10 MG tablet Take 1 tablet (10 mg total) by mouth daily.   clopidogrel (PLAVIX) 75 MG tablet TAKE ONE TABLET BY MOUTH EVERY MORNING   empagliflozin (JARDIANCE) 10 MG TABS tablet Take 1 tablet (10 mg total) by mouth daily. (Patient taking differently: Take 5 mg by mouth daily.)   famotidine (PEPCID) 20 MG tablet Take 20 mg by mouth daily.    fluticasone (FLONASE) 50 MCG/ACT nasal spray Place 2 sprays into both nostrils daily.   fluticasone (FLOVENT HFA) 220 MCG/ACT inhaler INHALE 1 PUFF BY MOUTH INTO LUNGS TWICE DAILY. MAY INCREASE TO TWO PUFFS IF needed (Patient taking differently: Inhale 2 puffs into the lungs 2 (two) times daily as needed (shortness of breath).)   furosemide (LASIX) 20 MG tablet TAKE ONE TABLET BY MOUTH EVERY MORNING   gabapentin (NEURONTIN) 300 MG capsule TAKE TWO CAPSULES BY MOUTH EVERY MORNING and TAKE TWO CAPSULES BY MOUTH EVERYDAY AT BEDTIME   glucose blood (ONETOUCH ULTRA) test strip Use to check glucose up to twice daily. E11.9 dx code   glucose blood (ONETOUCH VERIO) test strip 1 each by Other  route daily. Use as instructed   ipratropium (ATROVENT) 0.03 % nasal spray Place 1 spray into the nose every morning. (Patient not taking: Reported on 12/31/2020)   linagliptin (TRADJENTA) 5 MG TABS tablet Take 0.5 tablets (2.5 mg total) by mouth daily. TAKE 1/2 TABLET BY MOUTH DAILY *NEED OFFICE VISIT*   lisinopril (ZESTRIL) 20 MG tablet TAKE ONE TABLET BY MOUTH EVERY MORNING   metFORMIN (GLUCOPHAGE) 500 MG tablet TAKE TWO TABLETS BY MOUTH EVERY MORNING and TAKE  TWO TABLETS BY MOUTH EVERY EVENING   metoprolol tartrate (LOPRESSOR) 25 MG tablet Take 0.5 tablets (12.5 mg total) by mouth 2 (two) times daily.   Multiple Vitamins-Minerals (ADULT GUMMY PO) Take 2 capsules by mouth daily.   OneTouch Delica Lancets 49Z MISC Use to check glucose up to twice daily. E11.9 dx code   VITAMIN D PO Take 1 capsule by mouth daily.   No facility-administered encounter medications on file as of 03/13/2021.    Patient Active Problem List   Diagnosis Date Noted   Traumatic hematoma of left lower leg 09/26/2018   S/P total knee arthroplasty, left 03/29/2017   Spondylosis without myelopathy or radiculopathy, cervical region 03/29/2017   Bilateral carpal tunnel syndrome 03/29/2017   Acute asthma exacerbation 08/19/2016   Cervicalgia 03/23/2016   Vertigo 04/14/2015   Anxiety state 01/22/2015   HLD (hyperlipidemia) 01/22/2015   Type 2 diabetes mellitus with circulatory disorder ([place]) 01/22/2015   Intracranial vascular stenosis 01/22/2015   Aneurysm ([place])    Headache    Essential hypertension 11/12/2014   Dependent edema 11/12/2014   Peripheral neuropathy 11/12/2014   Depression 11/12/2014   Facial droop    TIA (transient ischemic attack) 11/11/2014   Hyperthyroidism 08/06/2014   Meniere's disease 06/07/2014   Goiter 06/13/2013   Obesity, unspecified 06/13/2013    Conditions to be addressed/monitored: HTN, Anxiety, and Depression.  Mental Health Concerns, Family and Relationship Dysfunction, Social Isolation, and Lacks Knowledge of [employer].  Care Plan : LCSW Plan of Care  Updates made by Francis Gaines, LCSW since 03/13/2021 12:00 AM     Problem: Reduce and Manage My Symptoms of Anxiety and Depression. Resolved 03/13/2021  Priority: High     Long-Range Goal: Reduce and Manage My Symptoms of Anxiety and Depression. 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 Clinical Goal(s):  Patient will work with LCSW to reduce and manage symptoms of Anxiety and Depression.  Patient will increase knowledge and/or ability of:  Coping Skills, Healthy Habits, Self-Management Skills, Stress Reduction, Home Safety and Utilizing [employer] and Resources.   Clinical Interventions:  Collaboration with Primary Care Physician, Dr. Janett Billow Copland regarding development and update of comprehensive plan of care as evidenced by provider attestation and co-signature. Inter-disciplinary care team collaboration (see longitudinal plan of care). Patient Goals/Self-Care Activities:  Contact LCSW directly (# [phone]) if you have questions, need assistance, or if additional social work needs are identified in the near future.   No Follow-Up Required.     [place] Licensed Clinical Social Worker [facility] [employer] [phone]

## 2021-03-13 NOTE — Patient Instructions (Signed)
Visit Information  Thank you for taking time to visit with me today. Please don't hesitate to contact me if I can be of assistance to you before our next scheduled telephone appointment.  Following are the goals we discussed today:  Patient Goals/Self-Care Activities:  Contact LCSW directly (# [phone]) if you have questions, need assistance, or if additional social work needs are identified in the near future.   No Follow-Up Required.  Please call the care guide team at [phone] if you need to cancel or reschedule your appointment.   If you are experiencing a Mental Health or [place] or need someone to talk to, please call the Suicide and Crisis Lifeline: 988 call the [place] National Suicide Prevention Lifeline: 843-217-1097 or TTY: 657 423 8332 TTY 762-850-0444) to talk to a trained counselor call 1-800-273-TALK (toll free, 24 hour hotline) go to [facility] [address] [place] [phone]) call the [place]: [phone] call 911   Patient verbalizes understanding of instructions and care plan provided today and agrees to view in [place]. Active MyChart status confirmed with patient.    [place] Licensed Clinical Social Worker [facility] [employer] [phone]

## 2021-03-17 ENCOUNTER — Other Ambulatory Visit: Payer: Self-pay | Admitting: Family Medicine

## 2021-03-17 DIAGNOSIS — I10 Essential (primary) hypertension: Secondary | ICD-10-CM

## 2021-03-19 ENCOUNTER — Ambulatory Visit: Payer: Medicare Other | Admitting: Pharmacist

## 2021-03-19 ENCOUNTER — Encounter: Payer: Self-pay | Admitting: Family Medicine

## 2021-03-19 ENCOUNTER — Telehealth (INDEPENDENT_AMBULATORY_CARE_PROVIDER_SITE_OTHER): Payer: Medicare Other | Admitting: Family Medicine

## 2021-03-19 DIAGNOSIS — J069 Acute upper respiratory infection, unspecified: Secondary | ICD-10-CM

## 2021-03-19 DIAGNOSIS — E118 Type 2 diabetes mellitus with unspecified complications: Secondary | ICD-10-CM

## 2021-03-19 MED ORDER — MOLNUPIRAVIR EUA 200MG CAPSULE: 4.0000 | ORAL_CAPSULE | Freq: Two times a day (BID) | ORAL | 0 refills | Status: AC
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
## 2021-03-19 NOTE — Progress Notes (Signed)
Virtual Video Visit via MyChart Note  I connected with  Sherry [name] on 03/19/21 at  2:00 PM EST by the video enabled telemedicine application for MyChart, and verified that I am speaking with the correct person using two identifiers.   I introduced myself as a Designer, jewellery with the practice. We discussed the limitations of evaluation and management by telemedicine and the availability of in person appointments. The patient expressed understanding and agreed to proceed.  Participating parties in this visit include: The patient and the nurse practitioner listed.  The patient is: At home I am: In the office - [facility]  Subjective:    CC:  URI, suspected COVID   HPI: Sherry [name] is a 71 y.o. year old female presenting today via [place] today for suspected COVID.  Patient reporting 2 days of body aches, poor appetite, weakness, cough, nasal congestion, scratchy throat, low grade fevers, chills, nausea, headache. States this is as bad as she felt when she had COVID last year. States she was recently with grandchildren with milder symptoms, but none of them have been tested for anything. Reports she does not have access to a COVID test. She denies any chest pain, dyspnea, ear pain, vomiting.      Past medical history, Surgical history, Family history not pertinant except as noted below, Social history, Allergies, and medications have been entered into the medical record, reviewed, and corrections made.   Review of Systems:  All review of systems negative except what is listed in the HPI   Objective:    General:  Speaking clearly in complete sentences. Absent shortness of breath noted.   Alert and oriented x3.   Normal judgment.  Absent acute distress.   Impression and Recommendations:    1. Upper respiratory tract infection, unspecified type - molnupiravir EUA (LAGEVRIO) 200 mg CAPS capsule; Take 4 capsules (800 mg total) by  mouth 2 (two) times daily for 5 days.  Dispense: 40 capsule; Refill: 0  Discussed options with patient. Given her risk of complication for COVID, she prefers to go ahead and treat with  molnupiravir verses waiting it out since she does not have a way to get tested. Continue supportive measures including rest, hydration, humidifier use, steam showers, warm compresses to sinuses, warm liquids with lemon and honey, and over-the-counter cough, cold, and analgesics as needed.  States she has plenty of OTC meds and does not need anything additional at this time. Patient aware of signs/symptoms requiring further/urgent evaluation.    Follow-up if symptoms worsen or fail to improve.    I discussed the assessment and treatment plan with the patient. The patient was provided an opportunity to ask questions and all were answered. The patient agreed with the plan and demonstrated an understanding of the instructions.   The patient was advised to call back or seek an in-person evaluation if the symptoms worsen or if the condition fails to improve as anticipated.  I spent 20 minutes dedicated to the care of this patient on the date of this encounter to include pre-visit chart review of prior notes and results, face-to-face time with the patient, and post-visit ordering of testing as indicated.   Terrilyn Saver, NP

## 2021-03-30 ENCOUNTER — Telehealth: Payer: Self-pay

## 2021-03-30 ENCOUNTER — Ambulatory Visit: Payer: Medicare Other | Admitting: Pharmacist

## 2021-03-30 DIAGNOSIS — J4521 Mild intermittent asthma with (acute) exacerbation: Secondary | ICD-10-CM

## 2021-03-30 DIAGNOSIS — E118 Type 2 diabetes mellitus with unspecified complications: Secondary | ICD-10-CM

## 2021-03-30 DIAGNOSIS — J069 Acute upper respiratory infection, unspecified: Secondary | ICD-10-CM

## 2021-03-30 MED ORDER — PREDNISONE 20 MG PO TABS: 20.0000 mg | ORAL_TABLET | Freq: Every day | ORAL | 0 refills | Status: AC

## 2021-03-30 MED ORDER — SPACER/AERO-HOLDING CHAMBERS DEVI: 0 refills | Status: AC
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
## 2021-03-30 NOTE — Telephone Encounter (Signed)
Pt okay with trying this.

## 2021-03-30 NOTE — Telephone Encounter (Signed)
JC pt but was last seen by you on 03/19/21 and JC is out until Wednesday. Please advise.

## 2021-03-30 NOTE — Telephone Encounter (Signed)
Nurse Assessment Nurse: Glean Salvo RN, Magda Paganini Date/Time Eilene Ghazi Time): 03/27/2021 6:19:48 PM Confirm and document reason for call. If symptomatic, describe symptoms. ---Caller states that she is having issues with her asthma and she is still having sx from [place] after tx. Does the patient have any new or worsening symptoms? ---Yes Will a triage be completed? ---Yes Related visit to physician within the last 2 weeks? ---Yes Does the PT have any chronic conditions? (i.e. diabetes, asthma, this includes High risk factors for pregnancy, etc.) ---Yes List chronic conditions. ---Asthma Is this a behavioral health or substance abuse call? ---No Guidelines Guideline Title Affirmed Question Affirmed Notes Nurse Date/Time (Eastern Time) Asthma Attack No asthma check-up in > 6 months Rebecca Eaton 03/27/2021 6:23:46 PM Disp. Time Eilene Ghazi Time) Disposition Final User 03/27/2021 6:16:29 PM Send to Urgent Queue Paulita Cradle 03/27/2021 6:32:28 PM See PCP within 2 Weeks Yes Glean Salvo, RN, Magda Paganini PLEASE NOTE: All timestamps contained within this report are represented as [place] Standard Time. CONFIDENTIALTY NOTICE: This fax transmission is intended only for the addressee. It contains information that is legally privileged, confidential or otherwise protected from use or disclosure. If you are not the intended recipient, you are strictly prohibited from reviewing, disclosing, copying using or disseminating any of this information or taking any action in reliance on or regarding this information. If you have received this fax in error, please notify us immediately by telephone so that we can arrange for its return to [place]. Phone: [phone], Toll-Free: [phone], Fax: [phone] Page: 2 of 2 Call Id: [id number] [place] Disagree/Comply Comply Caller Understands Yes PreDisposition Did not know what to do Care Advice Given Per Guideline SEE PCP WITHIN 2 WEEKS: * You need to be seen for this  ongoing problem within the next 2 weeks. * PCP VISIT: Call your doctor (or NP/PA) during regular office hours and make an appointment. ASTHMA ATTACK: * An ASTHMA ATTACK is an episode of worsening asthma symptoms and lung function. * People with asthma should have and follow an ASTHMA ACTION PLAN. An asthma action plan tells a person what to do during an asthma attack, based on their symptoms and peak flow. ASTHMA ATTACK - TREATMENT - QUICK-RELIEF MEDICINE: * Start your quick-relief medicine (e.g., albuterol, salbutamol) at the first sign of any coughing or shortness of breath (don't wait for wheezing). * For MILD ASTHMA SYMPTOMS use your quick-relief inhaler (2 puffs each time) or your nebulizer every 4 hours. Continue the quick-relief asthma medicine until you have not wheezed or coughed for 48 hours. It takes a minimum of 7 days of medicine for lung function to return to normal. ASTHMA ATTACK - TREATMENT - CONTROLLER MEDICINE: * If you are using a controller (long-term control) medicine, such as an INHALED STEROID, continue to take it as directed by your doctor (or NP/PA). * Most people with asthma should be using an inhaled steroid as a controller medicine every day. CARE ADVICE given per Asthma Attack (Adult) guideline. * You become worse * Mild wheezing or other asthma symptoms come and go for more than 3 days * Quick-relief asthma medicine (such as albuterol by inhaler or nebulizer) is needed more often than every 4 hour

## 2021-03-30 NOTE — Telephone Encounter (Signed)
This will go to [facility] once spoken to pt.

## 2021-03-30 NOTE — Patient Instructions (Signed)
Sherry [name], It was a pleasure speaking with you today.  I have attached a summary of our visit today and information about your health goals.    Patient Goals/Self-Care Activities Over the next 90 days, patient will:  take medications as prescribed,  check glucose 2 to 3 times per week, document, and provide at future appointments,  check blood pressure 2 to 3 times per week, document, and provide at future appointments,  weigh daily, and contact provider if weight gain of 3lbs in 24 hours or 5lb in 1 week, and increase water intake Continue to monitor pulse oxygen level (call office if < 95%)  If you have any questions or concerns, please feel free to contact me either at the phone number below or with a MyChart message.   Keep up the good work!  [name], PharmD Clinical Pharmacist [facility] [phone] (direct line)  [phone] (main office number)  Chronic Care Management CARE PLAN ENTRY (updated 03/30/2021)  Hypertension BP Readings from Last 3 Encounters:  01/06/21 133/70  11/17/20 (!) 145/89  07/30/20 (!) 148/82   Pharmacist Clinical Goal(s): Over the next 90 days, patient will work with PharmD and providers to maintain BP goal <140/90 Current regimen:  Lisinopril 20mg  daily Metoprolol 25mg  - take 0.5 tab = 12.5mg  twice daily Furosemide 20mg  each morning Interventions: Requested patient to check blood pressure 3 to 4 times per week and record Forwarded request for remote blood pressure monitoring program through [employer] Patient self care activities - Over the next 90 days, patient will: Check blood pressure 3 to 4 times per week, document, and provide at future appointments. I have forwarded request for remote blood pressure monitoring through [employer] daily salt intake < 2300 mg/day Report symptoms of low blood pressure like dizziness or if blood pressure is less than 854 on top (systolic BP) or  less than 60 on bottom (diastolic BP)  Hyperlipidemia / history of stroke Lipid Panel     Component Value Date/Time   CHOL 142 06/02/2020 0933   TRIG 96.0 06/02/2020 0933   HDL 62.10 06/02/2020 0933   CHOLHDL 2 06/02/2020 0933   VLDL 19.2 06/02/2020 0933   LDLCALC 61 06/02/2020 0933   Pharmacist Clinical Goal(s): Over the next 90 days, patient will work with PharmD and providers to maintain LDL goal < 70 Current regimen:  Diet and exercise management for cholesterol Clopidogrel 75mg  daily   Interventions: Updated allergy list to include intolerance to rosuvastatin Patient self care activities - Over the next 90 days, patient will: Maintain LDL <70 Continue to take clopidogrel for stroke prevention  Diabetes Lab Results  Component Value Date/Time   HGBA1C 6.6 (H) 06/02/2020 09:33 AM   HGBA1C 6.2 04/16/2019 03:56 PM   Pharmacist Clinical Goal(s): Over the next 90 days, patient will work with PharmD and providers to maintain A1c goal <7% Current regimen:  Tradjenta 5mg  1/2 tab daily Jardiance 10mg  1/2 tab daily  Metformin 500mg  - take 2 tablets = 1000mg  twice daily Interventions: Reviewed how to identify and treat hypoglycemia (low blood sugar) Reviewed home blood glucose readings and reviewed goals  Patient self care activities - Over the next 90 days, patient will: Maintain diabetes medication regimen Continue to check blood glucose daily.  Fasting blood glucose goal (before meals) = 80 to 130 Blood glucose goal after a meal = less than 180  Contact office if experience increase in low blood sugar readings (<80)  Depression Pharmacist Clinical  Goal(s) Over the next 90 days, patient will work with PharmD and providers to address depression symptoms and restart medication if needed.  Current regimen:  none Interventions: Referral to [facility] social worker Patient self care activities - Over the next 90 days, patient will: Call office to discuss with Dr Lorelei Pont if you  would like to restart sertraline 25mg   Low Serum Vitamin D: Pharmacist Clinical Goal(s) Over the next 90 days, patient will work with PharmD and providers to increase serum vitamin D to normal range (30 to 100) Current regimen:  Vitamin D3 2000 units daily Patient self care activities - Over the next 90 days, patient will: Continue to take vitamin D daily (included in pill packs now)   Asthma:  Pharmacist Clinical Goal(s) Over the next 90 days, patient will work with PharmD and providers to improve adherence to maintenance inhaler Current regimen:  Flovent 255mcg inhaler - inhale 1 puff twice a day (may increase to 2 puff twice a day if needed)  Albuterol Inhaler - inhale 2 puffs every 6 hours as needed.  Interventions: Discussed difference between maintenance / Flovent inhaler and rescue / albuterol inhaler Encouraged patient to use Flovent every day during months she is exposed to allergens.  Patient self care activities - Over the next 90 days, patient will: Use Flovent  / maintenance inhaler every day I have sent in prescription for spacer to use with Flovent and albuterol   Medication management Pharmacist Clinical Goal(s): Over the next 90 days, patient will work with PharmD and providers to achieve optimal medication adherence Current pharmacy: [facility] Interventions Comprehensive medication review performed. Utilize [facility] pharmacy for medication synchronization, packaging and delivery Patient self care activities - Over the next 90 days, patient will: Focus on medication adherence by filling medications appropriately  Take medications as prescribed Report any questions or concerns to PharmD and/or provider(s)  Patient Goals/Self-Care Activities Over the next 90 days, patient will:  take medications as prescribed,  check glucose 2 to 3 times per week, document, and provide at future appointments,  check blood pressure 2 to 3 times per week, document, and provide at  future appointments,  weigh daily, and contact provider if weight gain of 3lbs in 24 hours or 5lb in 1 week, and increase water intake Continue to monitor pulse oxygen level (call office if < 95%)  Patient verbalizes understanding of instructions and care plan provided today and agrees to view in [place]. Active MyChart status confirmed with patient.

## 2021-03-30 NOTE — Addendum Note (Signed)
Addended by: Terrilyn Saver on: 03/30/2021 04:12 PM   Modules accepted: Orders

## 2021-04-07 DIAGNOSIS — F320 Major depressive disorder, single episode, mild: Secondary | ICD-10-CM

## 2021-04-07 DIAGNOSIS — E1159 Type 2 diabetes mellitus with other circulatory complications: Secondary | ICD-10-CM

## 2021-04-07 DIAGNOSIS — E118 Type 2 diabetes mellitus with unspecified complications: Secondary | ICD-10-CM | POA: Diagnosis not present

## 2021-04-07 DIAGNOSIS — G459 Transient cerebral ischemic attack, unspecified: Secondary | ICD-10-CM | POA: Diagnosis not present

## 2021-04-07 DIAGNOSIS — F32A Depression, unspecified: Secondary | ICD-10-CM

## 2021-04-13 ENCOUNTER — Other Ambulatory Visit: Payer: Self-pay | Admitting: Family Medicine

## 2021-04-13 DIAGNOSIS — E118 Type 2 diabetes mellitus with unspecified complications: Secondary | ICD-10-CM

## 2021-04-22 ENCOUNTER — Other Ambulatory Visit: Payer: Self-pay | Admitting: Family Medicine

## 2021-04-22 DIAGNOSIS — Z1231 Encounter for screening mammogram for malignant neoplasm of breast: Secondary | ICD-10-CM

## 2021-04-28 DIAGNOSIS — H40013 Open angle with borderline findings, low risk, bilateral: Secondary | ICD-10-CM | POA: Diagnosis not present

## 2021-05-25 ENCOUNTER — Other Ambulatory Visit: Payer: Self-pay | Admitting: Family Medicine

## 2021-06-03 NOTE — Progress Notes (Signed)
? ?Subjective:  ? Sherry [name] is a 71 y.o. female who presents for Medicare Annual (Subsequent) preventive examination. ? ?Review of Systems    ? ?Cardiac Risk Factors include: advanced age (>22mn, >>57women);diabetes mellitus;hypertension;dyslipidemia ? ?   ?Objective:  ?  ?Today's Vitals  ? 06/04/21 0904 06/04/21 0926  ?BP: 128/74   ?Pulse: 72   ?Resp: 16   ?Temp: 97.7 ?F (36.5 ?C)   ?SpO2: 98%   ?Weight: 190 lb (86.2 kg)   ?Height: 5' (1.524 m)   ?PainSc:  7   ? ?Body mass index is 37.11 kg/m?. ? ? ?  06/04/2021  ?  9:22 AM 01/06/2021  ?  9:43 AM 12/03/2020  ? 12:16 PM 07/24/2020  ?  1:24 PM 05/29/2020  ?  8:49 AM 09/04/2019  ?  2:19 PM 04/12/2019  ? 10:18 AM  ?Advanced Directives  ?Does Patient Have a Medical Advance Directive? Yes Yes No No No No No  ?Type of [place] will       ?Does patient want to make changes to medical advance directive?  No - Patient declined       ?Copy of [place] Chart? No - copy requested No - copy requested       ?Would patient like information on creating a medical advance directive?   Yes (MAU/Ambulatory/Procedural Areas - Information given) Yes (MAU/Ambulatory/Procedural Areas - Information given) Yes (MAU/Ambulatory/Procedural Areas - Information given)  No - Patient declined  ? ? ?Current Medications (verified) ?Outpatient Encounter Medications as of 06/04/2021  ?Medication Sig  ? albuterol (VENTOLIN HFA) 108 (90 Base) MCG/ACT inhaler Inhale 2 puffs into the lungs every 6 (six) hours as needed for wheezing or shortness of breath.  ? Blood Glucose Monitoring Suppl (ONE TOUCH ULTRA 2) w/Device KIT Use to check glucose up to twice daily. E11.9 dx code  ? cetirizine (ZYRTEC) 10 MG tablet Take 1 tablet (10 mg total) by mouth daily.  ? clopidogrel (PLAVIX) 75 MG tablet TAKE ONE TABLET BY MOUTH EVERY MORNING  ? empagliflozin (JARDIANCE) 10 MG TABS tablet Take 1 tablet (10 mg  total) by mouth daily. (Patient taking differently: Take 5 mg by mouth daily.)  ? famotidine (PEPCID) 20 MG tablet Take 20 mg by mouth daily.   ? fluticasone (FLONASE) 50 MCG/ACT nasal spray Place 2 sprays into both nostrils daily.  ? fluticasone (FLOVENT HFA) 220 MCG/ACT inhaler INHALE 1 PUFF BY MOUTH INTO LUNGS TWICE DAILY. MAY INCREASE TO TWO PUFFS IF needed (Patient taking differently: Inhale 2 puffs into the lungs 2 (two) times daily as needed (shortness of breath).)  ? furosemide (LASIX) 20 MG tablet TAKE ONE TABLET BY MOUTH EVERY MORNING  ? gabapentin (NEURONTIN) 300 MG capsule TAKE TWO CAPSULES BY MOUTH EVERY MORNING and TAKE TWO CAPSULES BY MOUTH EVERYDAY AT BEDTIME  ? glucose blood (ONETOUCH VERIO) test strip 1 each by Other route daily. Use as instructed  ? ipratropium (ATROVENT) 0.03 % nasal spray Place 1 spray into the nose every morning.  ? linagliptin (TRADJENTA) 5 MG TABS tablet Take 0.5 tablets (2.5 mg total) by mouth daily.  ? lisinopril (ZESTRIL) 20 MG tablet TAKE ONE TABLET BY MOUTH EVERY MORNING  ? metFORMIN (GLUCOPHAGE) 500 MG tablet TAKE TWO TABLETS BY MOUTH EVERY MORNING and TAKE TWO TABLETS BY MOUTH EVERY EVENING  ? metoprolol tartrate (LOPRESSOR) 25 MG tablet TAKE 1/2 TABLET BY MOUTH EVERY MORNING and TAKE 1/2 TABLET  BY MOUTH EVERYDAY AT BEDTIME  ? Multiple Vitamins-Minerals (ADULT GUMMY PO) Take 2 capsules by mouth daily.  ? OneTouch Delica Lancets 41D MISC Use to check glucose up to twice daily. E11.9 dx code  ? ONETOUCH VERIO test strip USE TO CHECK BLOOD GLUCOSE UP TO TWICE DAILY  ? Spacer/Aero-Holding Dorise Bullion Use with Flovent and albuterol inhalers  ? VITAMIN D PO Take 1 capsule by mouth daily.  ? ?No facility-administered encounter medications on file as of 06/04/2021.  ? ? ?Allergies (verified) ?Influenza vaccines, Atorvastatin, Demerol, Nsaids, Pravastatin, Prednisone, Rosuvastatin, Roxicodone [oxycodone], Tramadol, Codeine, and Mango flavor [flavoring agent]  ? ?History: ?Past  Medical History:  ?Diagnosis Date  ? Asthma   ? Cerebral aneurysm   ? being watched by Dr. Kathee Delton every 6 months  ? Complication of anesthesia HYPOTENSION INTRAOP W/ HYSTERECTOMY IN 1987--  DID OK W/ TOTAL KNEE IN 2008  ? Diabetes mellitus   ? DJD (degenerative joint disease)   ? Dyslipidemia   ? GERD (gastroesophageal reflux disease)   ? H/O hiatal hernia   ? History of CHF (congestive heart failure) 2010-  RESOLVED  ? History of peptic ulcer YRS AGO  ? Hypertension   ? Meniere's disease   ? Mitral valve prolapse occasional palpitations w/ chest discomfort  ? OA (osteoarthritis)   ? Rotator cuff tear, left   ? Stroke [facility])   ? ?Past Surgical History:  ?Procedure Laterality Date  ? BUNIONECTOMY  2007  ? LEFT FOOT  ? IR 3D INDEPENDENT [facility]  01/06/2021  ? IR ANGIO INTRA EXTRACRAN SEL COM CAROTID INNOMINATE BILAT MOD SED  03/22/2019  ? IR ANGIO INTRA EXTRACRAN SEL COM CAROTID INNOMINATE BILAT MOD SED  01/06/2021  ? IR ANGIO VERTEBRAL SEL SUBCLAVIAN INNOMINATE UNI L MOD SED  03/22/2019  ? IR ANGIO VERTEBRAL SEL VERTEBRAL UNI R MOD SED  03/22/2019  ? IR ANGIO VERTEBRAL SEL VERTEBRAL UNI R MOD SED  01/06/2021  ? IR US GUIDE VASC ACCESS RIGHT  03/22/2019  ? IR US GUIDE VASC ACCESS RIGHT  01/06/2021  ? LEFT SHOULDER SURGERY  1997  ? ROTATOR CUFF REPAIR  ? REVERSE SHOULDER ARTHROPLASTY Right 07/30/2020  ? Procedure: REVERSE SHOULDER ARTHROPLASTY;  Surgeon: Hiram Gash, MD;  Location: [facility];  Service: Orthopedics;  Laterality: Right;  ? SHOULDER ARTHROSCOPY  09/20/2011  ? Procedure: ARTHROSCOPY SHOULDER;  Surgeon: Johnn Hai, MD;  Location: [facility];  Service: Orthopedics;  Laterality: Left;  SAD AND MINI OPEN ROTATOR CUFF REPAIR ? ?  ? TOTAL KNEE ARTHROPLASTY  09-12-2006  [facility]  ? RIGHT KNEE  ? TOTAL KNEE ARTHROPLASTY Left 11/30/2017  ? Procedure: LEFT TOTAL KNEE REPLACEMENT;  Surgeon: [name], MD;  Location: [place];  Service: Orthopedics;  Laterality: Left;  ? TUBAL LIGATION  1976  ?  TYMPANOPLASTY  1990;   1980  ? VAGINAL HYSTERECTOMY  1987  ? ?Family History  ?Problem Relation Age of Onset  ? Heart disease Mother   ?     in her 92s, heart attack  ? Diabetes Mother   ? Kidney disease Mother   ?     dialysis  ? Thyroid disease Mother   ? Aneurysm Mother   ? Ovarian cancer Mother   ? Breast cancer Mother   ? Thyroid disease Sister   ? Heart disease Sister   ? Lymphoma Father   ? Diabetes Father   ? Hypertension Father   ? Congestive Heart Failure Father   ?  Thyroid disease Brother   ? Thyroid disease Paternal Grandmother   ? Heart attack Sister   ?     at age 81  ? Stroke Sister   ? Hypertension Sister   ? Breast cancer Maternal Aunt   ? Breast cancer Maternal Grandmother   ? Breast cancer Maternal Aunt   ? Breast cancer Maternal Aunt   ? Other Brother   ?     Car accident  ? Bipolar disorder Son   ? Other Son   ?     GSW  ? ?Social History  ? ?Socioeconomic History  ? Marital status: Widowed  ?  Spouse name: Not on file  ? Number of children: 4  ? Years of education: 11  ? Highest education level: Bachelor's degree (e.g., BA, AB, BS)  ?Occupational History  ? Occupation: LPN - retired  ?Tobacco Use  ? Smoking status: Never  ?  Passive exposure: Never  ? Smokeless tobacco: Never  ?Vaping Use  ? Vaping Use: Never used  ?Substance and Sexual Activity  ? Alcohol use: No  ? Drug use: No  ? Sexual activity: Not Currently  ?Other Topics Concern  ? Not on file  ?Social History Narrative  ? Not on file  ? ?Social Determinants of Health  ? ?Financial Resource Strain: Low Risk   ? Difficulty of Paying Living Expenses: Not hard at all  ?Food Insecurity: No Food Insecurity  ? Worried About Charity fundraiser in the Last Year: Never true  ? Ran Out of Food in the Last Year: Never true  ?Transportation Needs: No Transportation Needs  ? Lack of Transportation (Medical): No  ? Lack of Transportation (Non-Medical): No  ?Physical Activity: Insufficiently Active  ? Days of Exercise per Week: 3 days  ? Minutes of  Exercise per Session: 30 min  ?Stress: Stress Concern Present  ? Feeling of Stress : Rather much  ?Social Connections: Moderately Integrated  ? Frequency of Communication with Friends and Family: More than three ti

## 2021-06-04 ENCOUNTER — Ambulatory Visit (INDEPENDENT_AMBULATORY_CARE_PROVIDER_SITE_OTHER): Payer: Medicare Other

## 2021-06-04 VITALS — BP 128/74 | HR 72 | Temp 97.7°F | Resp 16 | Ht 60.0 in | Wt 190.0 lb

## 2021-06-04 DIAGNOSIS — Z0000 Encounter for general adult medical examination without abnormal findings: Secondary | ICD-10-CM | POA: Diagnosis not present

## 2021-06-04 NOTE — Patient Instructions (Addendum)
Ms. Beam , ?Thank you for taking time to come for your Medicare Wellness Visit. I appreciate your ongoing commitment to your health goals. Please review the following plan we discussed and let me know if I can assist you in the future.  ? ?Screening recommendations/referrals: ?Colonoscopy: 03/05/16 due 03/05/26 ?Mammogram: 06/04/20 due 06/04/21 ?Bone Density: 04/20/19 due 04/19/21 ?Recommended yearly ophthalmology/optometry visit for glaucoma screening and checkup ?Recommended yearly dental visit for hygiene and checkup ? ?Vaccinations: ?Influenza vaccine: declined ?Pneumococcal vaccine: declined ?Tdap vaccine: Due-May obtain vaccine at our local pharmacy.  ?Shingles vaccine: declined   ?Covid-19:declined ? ?Advanced directives: yes, not on file ? ?Conditions/risks identified: see problem list ? ?Next appointment: Follow up in one year for your annual wellness visit  ? ? ?Preventive Care 71 Years and Older, Female ?Preventive care refers to lifestyle choices and visits with your health care provider that can promote health and wellness. ?What does preventive care include? ?A yearly physical exam. This is also called an annual well check. ?Dental exams once or twice a year. ?Routine eye exams. Ask your health care provider how often you should have your eyes checked. ?Personal lifestyle choices, including: ?Daily care of your teeth and gums. ?Regular physical activity. ?Eating a healthy diet. ?Avoiding tobacco and drug use. ?Limiting alcohol use. ?Practicing safe sex. ?Taking low-dose aspirin every day. ?Taking vitamin and mineral supplements as recommended by your health care provider. ?What happens during an annual well check? ?The services and screenings done by your health care provider during your annual well check will depend on your age, overall health, lifestyle risk factors, and family history of disease. ?Counseling  ?Your health care provider may ask you questions about your: ?Alcohol use. ?Tobacco  use. ?Drug use. ?Emotional well-being. ?Home and relationship well-being. ?Sexual activity. ?Eating habits. ?History of falls. ?Memory and ability to understand (cognition). ?Work and work Statistician. ?Reproductive health. ?Screening  ?You may have the following tests or measurements: ?Height, weight, and BMI. ?Blood pressure. ?Lipid and cholesterol levels. These may be checked every 5 years, or more frequently if you are over 70 years old. ?Skin check. ?Lung cancer screening. You may have this screening every year starting at age 71 if you have a 30-pack-year history of smoking and currently smoke or have quit within the past 15 years. ?Fecal occult blood test (FOBT) of the stool. You may have this test every year starting at age 10. ?Flexible sigmoidoscopy or colonoscopy. You may have a sigmoidoscopy every 5 years or a colonoscopy every 10 years starting at age 71. ?Hepatitis C blood test. ?Hepatitis B blood test. ?Sexually transmitted disease (STD) testing. ?Diabetes screening. This is done by checking your blood sugar (glucose) after you have not eaten for a while (fasting). You may have this done every 1-3 years. ?Bone density scan. This is done to screen for osteoporosis. You may have this done starting at age 71. ?Mammogram. This may be done every 1-2 years. Talk to your health care provider about how often you should have regular mammograms. ?Talk with your health care provider about your test results, treatment options, and if necessary, the need for more tests. ?Vaccines  ?Your health care provider may recommend certain vaccines, such as: ?Influenza vaccine. This is recommended every year. ?Tetanus, diphtheria, and acellular pertussis (Tdap, Td) vaccine. You may need a Td booster every 10 years. ?Zoster vaccine. You may need this after age 40. ?Pneumococcal 13-valent conjugate (PCV13) vaccine. One dose is recommended after age 10. ?Pneumococcal polysaccharide (PPSV23) vaccine. One  dose is recommended  after age 31. ?Talk to your health care provider about which screenings and vaccines you need and how often you need them. ?This information is not intended to replace advice given to you by your health care provider. Make sure you discuss any questions you have with your health care provider. ?Document Released: 02/21/2015 Document Revised: 10/15/2015 Document Reviewed: 11/26/2014 ?Elsevier Interactive Patient Education ? 2017 [employer]. ? ?Fall Prevention in the Home ?Falls can cause injuries. They can happen to people of all ages. There are many things you can do to make your home safe and to help prevent falls. ?What can I do on the outside of my home? ?Regularly fix the edges of walkways and driveways and fix any cracks. ?Remove anything that might make you trip as you walk through a door, such as a raised step or threshold. ?Trim any bushes or trees on the path to your home. ?Use bright outdoor lighting. ?Clear any walking paths of anything that might make someone trip, such as rocks or tools. ?Regularly check to see if handrails are loose or broken. Make sure that both sides of any steps have handrails. ?Any raised decks and porches should have guardrails on the edges. ?Have any leaves, snow, or ice cleared regularly. ?Use sand or salt on walking paths during winter. ?Clean up any spills in your garage right away. This includes oil or grease spills. ?What can I do in the bathroom? ?Use night lights. ?Install grab bars by the toilet and in the tub and shower. Do not use towel bars as grab bars. ?Use non-skid mats or decals in the tub or shower. ?If you need to sit down in the shower, use a plastic, non-slip stool. ?Keep the floor dry. Clean up any water that spills on the floor as soon as it happens. ?Remove soap buildup in the tub or shower regularly. ?Attach bath mats securely with double-sided non-slip rug tape. ?Do not have throw rugs and other things on the floor that can make you trip. ?What can I do  in the bedroom? ?Use night lights. ?Make sure that you have a light by your bed that is easy to reach. ?Do not use any sheets or blankets that are too big for your bed. They should not hang down onto the floor. ?Have a firm chair that has side arms. You can use this for support while you get dressed. ?Do not have throw rugs and other things on the floor that can make you trip. ?What can I do in the kitchen? ?Clean up any spills right away. ?Avoid walking on wet floors. ?Keep items that you use a lot in easy-to-reach places. ?If you need to reach something above you, use a strong step stool that has a grab bar. ?Keep electrical cords out of the way. ?Do not use floor polish or wax that makes floors slippery. If you must use wax, use non-skid floor wax. ?Do not have throw rugs and other things on the floor that can make you trip. ?What can I do with my stairs? ?Do not leave any items on the stairs. ?Make sure that there are handrails on both sides of the stairs and use them. Fix handrails that are broken or loose. Make sure that handrails are as long as the stairways. ?Check any carpeting to make sure that it is firmly attached to the stairs. Fix any carpet that is loose or worn. ?Avoid having throw rugs at the top or bottom of the  stairs. If you do have throw rugs, attach them to the floor with carpet tape. ?Make sure that you have a light switch at the top of the stairs and the bottom of the stairs. If you do not have them, ask someone to add them for you. ?What else can I do to help prevent falls? ?Wear shoes that: ?Do not have high heels. ?Have rubber bottoms. ?Are comfortable and fit you well. ?Are closed at the toe. Do not wear sandals. ?If you use a stepladder: ?Make sure that it is fully opened. Do not climb a closed stepladder. ?Make sure that both sides of the stepladder are locked into place. ?Ask someone to hold it for you, if possible. ?Clearly mark and make sure that you can see: ?Any grab bars or  handrails. ?First and last steps. ?Where the edge of each step is. ?Use tools that help you move around (mobility aids) if they are needed. These include: ?Canes. ?Walkers. ?Scooters. ?Crutches. ?Turn on the lights when

## 2021-06-05 ENCOUNTER — Ambulatory Visit: Payer: Medicare Other

## 2021-06-09 ENCOUNTER — Ambulatory Visit: Payer: Medicare Other

## 2021-06-09 NOTE — Progress Notes (Deleted)
[place] at [facility] [address], [place], [place] [address] [phone]  Date:  06/15/2021   Name:  Sherry [name]   DOB:  May 13, 1950   MRN:  [id number]  PCP:  Darreld Mclean, MD    Chief Complaint: No chief complaint on file.   History of Present Illness:  Sherry [name] is a 71 y.o. very pleasant female patient who presents with the following:  Patient seen today for follow-up Most recent visit with myself was about 1 year ago At that time she was preparing have a shoulder replacement surgery-this was completed in June of last year  History of TIA 2016, hypertension, intracranial vascular stenosis, hypothyroidism, diabetes with peripheral neuropathy, CHF, hyperlipidemia Her vascular issues are followed by Dr. Mathews [facility] radiology Also history of brain aneurysm, most recent MRI was in November of last year  She is overdue for A1c Foot exam due Has not gotten COVID or shingles vaccines due to history of anaphylaxis with flu shot Eye exam is up-to-date Mammogram up-to-date Can offer DEXA scan Patient Active Problem List   Diagnosis Date Noted   Traumatic hematoma of left lower leg 09/26/2018   S/P total knee arthroplasty, left 03/29/2017   Spondylosis without myelopathy or radiculopathy, cervical region 03/29/2017   Bilateral carpal tunnel syndrome 03/29/2017   Acute asthma exacerbation 08/19/2016   Cervicalgia 03/23/2016   Vertigo 04/14/2015   Anxiety state 01/22/2015   HLD (hyperlipidemia) 01/22/2015   Type 2 diabetes mellitus with circulatory disorder ([place]) 01/22/2015   Intracranial vascular stenosis 01/22/2015   Aneurysm ([place])    Headache    Essential hypertension 11/12/2014   Dependent edema 11/12/2014   Peripheral neuropathy 11/12/2014   Depression 11/12/2014   Facial droop    TIA (transient ischemic attack) 11/11/2014   Hyperthyroidism 08/06/2014   Meniere's disease 06/07/2014    Goiter 06/13/2013   Obesity, unspecified 06/13/2013    Past Medical History:  Diagnosis Date   Asthma    Cerebral aneurysm    being watched by Dr. Kathee Delton every 6 months   Complication of anesthesia HYPOTENSION INTRAOP W/ HYSTERECTOMY IN 1987--  DID OK W/ TOTAL KNEE IN 2008   Diabetes mellitus    DJD (degenerative joint disease)    Dyslipidemia    GERD (gastroesophageal reflux disease)    H/O hiatal hernia    History of CHF (congestive heart failure) 2010-  RESOLVED   History of peptic ulcer YRS AGO   Hypertension    Meniere's disease    Mitral valve prolapse occasional palpitations w/ chest discomfort   OA (osteoarthritis)    Rotator cuff tear, left    Stroke [facility])     Past Surgical History:  Procedure Laterality Date   BUNIONECTOMY  2007   LEFT FOOT   IR 3D INDEPENDENT WKST  01/06/2021   IR ANGIO INTRA EXTRACRAN SEL COM CAROTID INNOMINATE BILAT MOD SED  03/22/2019   IR ANGIO INTRA EXTRACRAN SEL COM CAROTID INNOMINATE BILAT MOD SED  01/06/2021   IR ANGIO VERTEBRAL SEL SUBCLAVIAN INNOMINATE UNI L MOD SED  03/22/2019   IR ANGIO VERTEBRAL SEL VERTEBRAL UNI R MOD SED  03/22/2019   IR ANGIO VERTEBRAL SEL VERTEBRAL UNI R MOD SED  01/06/2021   IR US GUIDE VASC ACCESS RIGHT  03/22/2019   IR US GUIDE VASC ACCESS RIGHT  01/06/2021   LEFT SHOULDER SURGERY  1997   ROTATOR CUFF REPAIR   REVERSE SHOULDER ARTHROPLASTY Right 07/30/2020  Procedure: REVERSE SHOULDER ARTHROPLASTY;  Surgeon: Hiram Gash, MD;  Location: [facility];  Service: Orthopedics;  Laterality: Right;   SHOULDER ARTHROSCOPY  09/20/2011   Procedure: ARTHROSCOPY SHOULDER;  Surgeon: Johnn Hai, MD;  Location: [facility];  Service: Orthopedics;  Laterality: Left;  SAD AND MINI OPEN ROTATOR CUFF REPAIR     TOTAL KNEE ARTHROPLASTY  09-12-2006  [facility]   RIGHT KNEE   TOTAL KNEE ARTHROPLASTY Left 11/30/2017   Procedure: LEFT TOTAL KNEE REPLACEMENT;  Surgeon: [name], MD;  Location: [facility];  Service:  Orthopedics;  Laterality: Left;   TUBAL LIGATION  1976   TYMPANOPLASTY  1990;   1980   VAGINAL HYSTERECTOMY  1987    Social History   Tobacco Use   Smoking status: Never    Passive exposure: Never   Smokeless tobacco: Never  Vaping Use   Vaping Use: Never used  Substance Use Topics   Alcohol use: No   Drug use: No    Family History  Problem Relation Age of Onset   Heart disease Mother        in her 18s, heart attack   Diabetes Mother    Kidney disease Mother        dialysis   Thyroid disease Mother    Aneurysm Mother    Ovarian cancer Mother    Breast cancer Mother    Thyroid disease Sister    Heart disease Sister    Lymphoma Father    Diabetes Father    Hypertension Father    Congestive Heart Failure Father    Thyroid disease Brother    Thyroid disease Paternal Grandmother    Heart attack Sister        at age 20   Stroke Sister    Hypertension Sister    Breast cancer Maternal Aunt    Breast cancer Maternal Grandmother    Breast cancer Maternal Aunt    Breast cancer Maternal Aunt    Other Brother        Car accident   Bipolar disorder Son    Other Son        GSW    Allergies  Allergen Reactions   Influenza Vaccines Anaphylaxis   Atorvastatin Other (See Comments)    Joint pain / myalgias   Demerol Other (See Comments)    SEIZURE   Nsaids Other (See Comments)    NAPROXEN, IBUPROFEN, SKELAXIN---  CAUSES PALPITATIONS   Pravastatin Other (See Comments)    Myalgias    Prednisone Other (See Comments)    Caused her glucose to go up to 500 and went to ED   Rosuvastatin Other (See Comments)    Joint stiffness and muscle pain   Roxicodone [Oxycodone] Itching and Other (See Comments)    Hallucinations and itching   Tramadol Other (See Comments)    Contraindicated with Victoza    Codeine Itching   Mango Flavor [Flavoring Agent] Itching    Medication list has been reviewed and updated.  Current Outpatient Medications on File Prior to Visit  Medication  Sig Dispense Refill   albuterol (VENTOLIN HFA) 108 (90 Base) MCG/ACT inhaler Inhale 2 puffs into the lungs every 6 (six) hours as needed for wheezing or shortness of breath. 18 g 2   Blood Glucose Monitoring Suppl (ONE TOUCH ULTRA 2) w/Device KIT Use to check glucose up to twice daily. E11.9 dx code 1 kit 0   cetirizine (ZYRTEC) 10 MG tablet Take 1 tablet (10 mg  total) by mouth daily. 90 tablet 3   clopidogrel (PLAVIX) 75 MG tablet TAKE ONE TABLET BY MOUTH EVERY MORNING 90 tablet 3   empagliflozin (JARDIANCE) 10 MG TABS tablet Take 1 tablet (10 mg total) by mouth daily. (Patient taking differently: Take 5 mg by mouth daily.) 90 tablet 3   famotidine (PEPCID) 20 MG tablet Take 20 mg by mouth daily.      fluticasone (FLONASE) 50 MCG/ACT nasal spray Place 2 sprays into both nostrils daily.     fluticasone (FLOVENT HFA) 220 MCG/ACT inhaler INHALE 1 PUFF BY MOUTH INTO LUNGS TWICE DAILY. MAY INCREASE TO TWO PUFFS IF needed (Patient taking differently: Inhale 2 puffs into the lungs 2 (two) times daily as needed (shortness of breath).) 12 g 5   furosemide (LASIX) 20 MG tablet TAKE ONE TABLET BY MOUTH EVERY MORNING 90 tablet 3   gabapentin (NEURONTIN) 300 MG capsule TAKE TWO CAPSULES BY MOUTH EVERY MORNING and TAKE TWO CAPSULES BY MOUTH EVERYDAY AT BEDTIME 360 capsule 3   glucose blood (ONETOUCH VERIO) test strip 1 each by Other route daily. Use as instructed     ipratropium (ATROVENT) 0.03 % nasal spray Place 1 spray into the nose every morning. 60 mL 3   linagliptin (TRADJENTA) 5 MG TABS tablet Take 0.5 tablets (2.5 mg total) by mouth daily. 45 tablet 0   lisinopril (ZESTRIL) 20 MG tablet TAKE ONE TABLET BY MOUTH EVERY MORNING 90 tablet 3   metFORMIN (GLUCOPHAGE) 500 MG tablet TAKE TWO TABLETS BY MOUTH EVERY MORNING and TAKE TWO TABLETS BY MOUTH EVERY EVENING 360 tablet 3   metoprolol tartrate (LOPRESSOR) 25 MG tablet TAKE 1/2 TABLET BY MOUTH EVERY MORNING and TAKE 1/2 TABLET BY MOUTH EVERYDAY AT BEDTIME 90  tablet 1   Multiple Vitamins-Minerals (ADULT GUMMY PO) Take 2 capsules by mouth daily.     OneTouch Delica Lancets 39P MISC Use to check glucose up to twice daily. E11.9 dx code 200 each 3   ONETOUCH VERIO test strip USE TO CHECK BLOOD GLUCOSE UP TO TWICE DAILY 200 strip 6   Spacer/Aero-Holding Chambers DEVI Use with Flovent and albuterol inhalers 1 each 0   VITAMIN D PO Take 1 capsule by mouth daily.     No current facility-administered medications on file prior to visit.    Review of Systems:  As per HPI- otherwise negative.   Physical Examination: There were no vitals filed for this visit. There were no vitals filed for this visit. There is no height or weight on file to calculate BMI. Ideal Body Weight:    GEN: no acute distress. HEENT: Atraumatic, Normocephalic.  Ears and Nose: No external deformity. CV: RRR, No M/G/R. No JVD. No thrill. No extra heart sounds. PULM: CTA B, no wheezes, crackles, rhonchi. No retractions. No resp. distress. No accessory muscle use. ABD: S, NT, ND, +BS. No rebound. No HSM. EXTR: No c/c/e PSYCH: Normally interactive. Conversant.    Assessment and Plan: ***  Signed [name], MD

## 2021-06-09 NOTE — Patient Instructions (Incomplete)
Good to see you again today!  I will be in touch with your labs   

## 2021-06-10 ENCOUNTER — Ambulatory Visit (INDEPENDENT_AMBULATORY_CARE_PROVIDER_SITE_OTHER): Payer: Medicare Other | Admitting: Family Medicine

## 2021-06-10 ENCOUNTER — Ambulatory Visit (HOSPITAL_BASED_OUTPATIENT_CLINIC_OR_DEPARTMENT_OTHER): Admission: RE | Admit: 2021-06-10 | Payer: Medicare Other | Source: Ambulatory Visit

## 2021-06-10 ENCOUNTER — Ambulatory Visit (HOSPITAL_BASED_OUTPATIENT_CLINIC_OR_DEPARTMENT_OTHER)
Admission: RE | Admit: 2021-06-10 | Discharge: 2021-06-10 | Disposition: A | Discharge status: Home / Self Care | Payer: Medicare Other | Source: Ambulatory Visit | Attending: Family Medicine | Admitting: Family Medicine

## 2021-06-10 VITALS — BP 138/82 | HR 63 | Resp 18 | Ht 61.0 in | Wt 193.6 lb

## 2021-06-10 DIAGNOSIS — M25562 Pain in left knee: Secondary | ICD-10-CM | POA: Insufficient documentation

## 2021-06-10 DIAGNOSIS — M25552 Pain in left hip: Secondary | ICD-10-CM

## 2021-06-10 DIAGNOSIS — M25462 Effusion, left knee: Secondary | ICD-10-CM | POA: Diagnosis not present

## 2021-06-10 DIAGNOSIS — M1612 Unilateral primary osteoarthritis, left hip: Secondary | ICD-10-CM | POA: Diagnosis not present

## 2021-06-10 DIAGNOSIS — Z96642 Presence of left artificial hip joint: Secondary | ICD-10-CM | POA: Diagnosis not present

## 2021-06-10 DIAGNOSIS — M47816 Spondylosis without myelopathy or radiculopathy, lumbar region: Secondary | ICD-10-CM | POA: Diagnosis not present

## 2021-06-10 DIAGNOSIS — M533 Sacrococcygeal disorders, not elsewhere classified: Secondary | ICD-10-CM | POA: Diagnosis not present

## 2021-06-10 MED ORDER — HYDROCODONE-ACETAMINOPHEN 5-325 MG PO TABS: 1.0000 | ORAL_TABLET | Freq: Three times a day (TID) | ORAL | 0 refills | Status: AC | PRN

## 2021-06-10 NOTE — Progress Notes (Addendum)
Therapist, music at [facility] ?[address], [address] ?[place], [place] [address] ?336 [phone] ?Fax [phone] ? ?Date:  06/10/2021  ? ?Name:  Sherry [name]   DOB:  19-Jan-1951   MRN:  [id number] ? ?PCP:  Darreld Mclean, MD  ? ? ?Chief Complaint: knee and hip pain (Pt says yesterday 06/09/21 she walked away from her grocery cart and her knee buckled and she hurt her L knee and hip. She has had a Total Knee Replacement on that knee already. /Pt says she was scheduled for her annual on 06/15/21. ) ? ? ?History of Present Illness: ? ?Sherry [name] is a 71 y.o. very pleasant female patient who presents with the following: ? ?Pt seen today following an injury- she was walking yesterday in a store and her left knee twisted and seemed to give way from her ?She did not fall to the ground.  She felt a tearing feeling in her knee and has pain in her hip ?His hip aches still, she is having a hard time walking.  She is using a cane ?She is using Tylenol which is giving partial pain relief ?Her left knee was replaced a few years ago per Dr Lorin Mercy, the left hip has not been operated on ? ?Last seen by myself about a year ago- 4/22 ? ?Patient Active Problem List  ? Diagnosis Date Noted  ? Traumatic hematoma of left lower leg 09/26/2018  ? S/P total knee arthroplasty, left 03/29/2017  ? Spondylosis without myelopathy or radiculopathy, cervical region 03/29/2017  ? Bilateral carpal tunnel syndrome 03/29/2017  ? Acute asthma exacerbation 08/19/2016  ? Cervicalgia 03/23/2016  ? Vertigo 04/14/2015  ? Anxiety state 01/22/2015  ? HLD (hyperlipidemia) 01/22/2015  ? Type 2 diabetes mellitus with circulatory disorder ([place]) 01/22/2015  ? Intracranial vascular stenosis 01/22/2015  ? Aneurysm ([place])   ? Headache   ? Essential hypertension 11/12/2014  ? Dependent edema 11/12/2014  ? Peripheral neuropathy 11/12/2014  ? Depression 11/12/2014  ? Facial droop   ? TIA (transient ischemic attack) 11/11/2014  ? Hyperthyroidism  08/06/2014  ? Meniere's disease 06/07/2014  ? Goiter 06/13/2013  ? Obesity, unspecified 06/13/2013  ? ? ?Past Medical History:  ?Diagnosis Date  ? Asthma   ? Cerebral aneurysm   ? being watched by Dr. Kathee Delton every 6 months  ? Complication of anesthesia HYPOTENSION INTRAOP W/ HYSTERECTOMY IN 1987--  DID OK W/ TOTAL KNEE IN 2008  ? Diabetes mellitus   ? DJD (degenerative joint disease)   ? Dyslipidemia   ? GERD (gastroesophageal reflux disease)   ? H/O hiatal hernia   ? History of CHF (congestive heart failure) 2010-  RESOLVED  ? History of peptic ulcer YRS AGO  ? Hypertension   ? Meniere's disease   ? Mitral valve prolapse occasional palpitations w/ chest discomfort  ? OA (osteoarthritis)   ? Rotator cuff tear, left   ? Stroke [facility])   ? ? ?Past Surgical History:  ?Procedure Laterality Date  ? BUNIONECTOMY  2007  ? LEFT FOOT  ? IR 3D INDEPENDENT WKST  01/06/2021  ? IR ANGIO INTRA EXTRACRAN SEL COM CAROTID INNOMINATE BILAT MOD SED  03/22/2019  ? IR ANGIO INTRA EXTRACRAN SEL COM CAROTID INNOMINATE BILAT MOD SED  01/06/2021  ? IR ANGIO VERTEBRAL SEL SUBCLAVIAN INNOMINATE UNI L MOD SED  03/22/2019  ? IR ANGIO VERTEBRAL SEL VERTEBRAL UNI R MOD SED  03/22/2019  ? IR ANGIO VERTEBRAL SEL VERTEBRAL UNI R  MOD SED  01/06/2021  ? IR US GUIDE VASC ACCESS RIGHT  03/22/2019  ? IR US GUIDE VASC ACCESS RIGHT  01/06/2021  ? LEFT SHOULDER SURGERY  1997  ? ROTATOR CUFF REPAIR  ? REVERSE SHOULDER ARTHROPLASTY Right 07/30/2020  ? Procedure: REVERSE SHOULDER ARTHROPLASTY;  Surgeon: Hiram Gash, MD;  Location: [facility];  Service: Orthopedics;  Laterality: Right;  ? SHOULDER ARTHROSCOPY  09/20/2011  ? Procedure: ARTHROSCOPY SHOULDER;  Surgeon: Johnn Hai, MD;  Location: [facility];  Service: Orthopedics;  Laterality: Left;  SAD AND MINI OPEN ROTATOR CUFF REPAIR ? ?  ? TOTAL KNEE ARTHROPLASTY  09-12-2006  [facility]  ? RIGHT KNEE  ? TOTAL KNEE ARTHROPLASTY Left 11/30/2017  ? Procedure: LEFT TOTAL KNEE REPLACEMENT;  Surgeon: [name], MD;  Location: [facility];  Service: Orthopedics;  Laterality: Left;  ? TUBAL LIGATION  1976  ? TYMPANOPLASTY  1990;   1980  ? VAGINAL HYSTERECTOMY  1987  ? ? ?Social History  ? ?Tobacco Use  ? Smoking status: Never  ?  Passive exposure: Never  ? Smokeless tobacco: Never  ?Vaping Use  ? Vaping Use: Never used  ?Substance Use Topics  ? Alcohol use: No  ? Drug use: No  ? ? ?Family History  ?Problem Relation Age of Onset  ? Heart disease Mother   ?     in her 14s, heart attack  ? Diabetes Mother   ? Kidney disease Mother   ?     dialysis  ? Thyroid disease Mother   ? Aneurysm Mother   ? Ovarian cancer Mother   ? Breast cancer Mother   ? Thyroid disease Sister   ? Heart disease Sister   ? Lymphoma Father   ? Diabetes Father   ? Hypertension Father   ? Congestive Heart Failure Father   ? Thyroid disease Brother   ? Thyroid disease Paternal Grandmother   ? Heart attack Sister   ?     at age 42  ? Stroke Sister   ? Hypertension Sister   ? Breast cancer Maternal Aunt   ? Breast cancer Maternal Grandmother   ? Breast cancer Maternal Aunt   ? Breast cancer Maternal Aunt   ? Other Brother   ?     Car accident  ? Bipolar disorder Son   ? Other Son   ?     GSW  ? ? ?Allergies  ?Allergen Reactions  ? Influenza Vaccines Anaphylaxis  ? Atorvastatin Other (See Comments)  ?  Joint pain / myalgias  ? Demerol Other (See Comments)  ?  SEIZURE  ? Nsaids Other (See Comments)  ?  NAPROXEN, IBUPROFEN, SKELAXIN---  CAUSES PALPITATIONS  ? Pravastatin Other (See Comments)  ?  Myalgias   ? Prednisone Other (See Comments)  ?  Caused her glucose to go up to 500 and went to ED  ? Rosuvastatin Other (See Comments)  ?  Joint stiffness and muscle pain  ? Roxicodone [Oxycodone] Itching and Other (See Comments)  ?  Hallucinations and itching  ? Tramadol Other (See Comments)  ?  Contraindicated with Victoza   ? Codeine Itching  ? Mango Flavor [Flavoring Agent] Itching  ? ? ?Medication list has been reviewed and updated. ? ?Current Outpatient  Medications on File Prior to Visit  ?Medication Sig Dispense Refill  ? albuterol (VENTOLIN HFA) 108 (90 Base) MCG/ACT inhaler Inhale 2 puffs into the lungs every 6 (six) hours as needed for wheezing  or shortness of breath. 18 g 2  ? Blood Glucose Monitoring Suppl (ONE TOUCH ULTRA 2) w/Device KIT Use to check glucose up to twice daily. E11.9 dx code 1 kit 0  ? cetirizine (ZYRTEC) 10 MG tablet Take 1 tablet (10 mg total) by mouth daily. 90 tablet 3  ? clopidogrel (PLAVIX) 75 MG tablet TAKE ONE TABLET BY MOUTH EVERY MORNING 90 tablet 3  ? empagliflozin (JARDIANCE) 10 MG TABS tablet Take 1 tablet (10 mg total) by mouth daily. (Patient taking differently: Take 5 mg by mouth daily.) 90 tablet 3  ? famotidine (PEPCID) 20 MG tablet Take 20 mg by mouth daily.     ? fluticasone (FLONASE) 50 MCG/ACT nasal spray Place 2 sprays into both nostrils daily.    ? fluticasone (FLOVENT HFA) 220 MCG/ACT inhaler INHALE 1 PUFF BY MOUTH INTO LUNGS TWICE DAILY. MAY INCREASE TO TWO PUFFS IF needed (Patient taking differently: Inhale 2 puffs into the lungs 2 (two) times daily as needed (shortness of breath).) 12 g 5  ? furosemide (LASIX) 20 MG tablet TAKE ONE TABLET BY MOUTH EVERY MORNING 90 tablet 3  ? gabapentin (NEURONTIN) 300 MG capsule TAKE TWO CAPSULES BY MOUTH EVERY MORNING and TAKE TWO CAPSULES BY MOUTH EVERYDAY AT BEDTIME 360 capsule 3  ? glucose blood (ONETOUCH VERIO) test strip 1 each by Other route daily. Use as instructed    ? ipratropium (ATROVENT) 0.03 % nasal spray Place 1 spray into the nose every morning. 60 mL 3  ? linagliptin (TRADJENTA) 5 MG TABS tablet Take 0.5 tablets (2.5 mg total) by mouth daily. 45 tablet 0  ? lisinopril (ZESTRIL) 20 MG tablet TAKE ONE TABLET BY MOUTH EVERY MORNING 90 tablet 3  ? metFORMIN (GLUCOPHAGE) 500 MG tablet TAKE TWO TABLETS BY MOUTH EVERY MORNING and TAKE TWO TABLETS BY MOUTH EVERY EVENING 360 tablet 3  ? metoprolol tartrate (LOPRESSOR) 25 MG tablet TAKE 1/2 TABLET BY MOUTH EVERY MORNING and  TAKE 1/2 TABLET BY MOUTH EVERYDAY AT BEDTIME 90 tablet 1  ? Multiple Vitamins-Minerals (ADULT GUMMY PO) Take 2 capsules by mouth daily.    ? OneTouch Delica Lancets 72W MISC Use to check glucose up to twic

## 2021-06-10 NOTE — Addendum Note (Signed)
Addended by: [name] C on: 06/10/2021 05:39 PM ? ? Modules accepted: Orders, Level of Service ? ?

## 2021-06-11 ENCOUNTER — Telehealth: Payer: Self-pay | Admitting: Family Medicine

## 2021-06-11 ENCOUNTER — Encounter: Payer: Self-pay | Admitting: Surgery

## 2021-06-11 ENCOUNTER — Ambulatory Visit: Payer: Medicare Other | Admitting: Surgery

## 2021-06-11 VITALS — BP 153/83 | HR 72 | Ht 61.0 in | Wt 193.6 lb

## 2021-06-11 DIAGNOSIS — M7062 Trochanteric bursitis, left hip: Secondary | ICD-10-CM | POA: Diagnosis not present

## 2021-06-11 MED ORDER — BUPIVACAINE HCL 0.25 % IJ SOLN
6.0000 mL | INTRAMUSCULAR | Status: AC | PRN
  Administered 2021-06-11: 6 mL via INTRA_ARTICULAR

## 2021-06-11 MED ORDER — METHYLPREDNISOLONE ACETATE 40 MG/ML IJ SUSP
40.0000 mg | INTRAMUSCULAR | Status: AC | PRN
  Administered 2021-06-11: 40 mg via INTRA_ARTICULAR

## 2021-06-11 MED ORDER — LIDOCAINE HCL 1 % IJ SOLN
3.0000 mL | INTRAMUSCULAR | Status: AC | PRN
  Administered 2021-06-11: 3 mL

## 2021-06-11 NOTE — Progress Notes (Signed)
? ?Office Visit Note ?  ?Patient: Sherry [name]           ?Date of Birth: 1950/07/05           ?MRN: [id number] ?Visit Date: 06/11/2021 ?             ?Requested by: Darreld Mclean, MD ?[address] ?[address] ?[place],  [place] [address] ?PCP: Darreld Mclean, MD ? ? ?Assessment & Plan: ?Visit Diagnoses:  ?1. Greater trochanteric bursitis of left hip   ? ? ?Plan: CBG in clinic today 112.  Today I recommended doing a Marcaine/Depo-Medrol trochanter bursa injection.  And patient consent left lateral hip was prepped with Betadine and injection performed.  Tolerated without complication.  Patient states that previous injections have elevated her blood sugars and advised me that she will strictly monitor her diet and blood sugars over the next few days.  I will have patient follow-up with Dr. Lorin Mercy in 2 weeks for recheck. ? ?Follow-Up Instructions: Return in about 2 weeks (around 06/25/2021) for With Dr. Lorin Mercy recheck hip pain.  ? ?Orders:  ?No orders of the defined types were placed in this encounter. ? ?No orders of the defined types were placed in this encounter. ? ? ? ? Procedures: ?Large Joint Inj: L greater trochanter on 06/11/2021 3:42 PM ?Details: 22 G 3.5 in needle, lateral approach ?Medications: 3 mL lidocaine 1 %; 6 mL bupivacaine 0.25 %; 40 mg methylPREDNISolone acetate 40 MG/ML ?Outcome: tolerated well, no immediate complications ?Consent was given by the patient. Patient was prepped and draped in the usual sterile fashion.  ? ? ? ? ?Clinical Data: ?No additional findings. ? ? ?Subjective: ?Chief Complaint  ?Patient presents with  ? Left Hip - Pain  ? ? ?HPI ?71 year old black female comes in with complaints of left lateral hip pain.  I reviewed primary care provider Dr. Janett Billow Coplin's note from yesterday.  Patient was walking at the grocery store Jun 09, 2021 when her left knee buckled and she had pain in her left hip.  Seen by PCP yesterday who ordered hip x-rays and also CT scan left hip.   Results are below.  Patient states that she has not had pain in the lateral hip like this before onset a couple days ago.  Localizes pain to around the greater trochanter bursa.  Pain when she is ambulating and laying on the left side.  Has had some groin pain but not too bad.  No true lumbar radicular component. ?Review of Systems ?No current cardiopulmonary GI/GU issues ? ?Objective: ?Vital Signs: BP (!) 153/83   Pulse 72   Ht '5\' 1"'$  (1.549 m)   Wt 193 lb 9.6 oz (87.8 kg)   BMI 36.58 kg/m?  ? ?Physical Exam ?Constitutional:   ?   Appearance: She is obese.  ?Eyes:  ?   Extraocular Movements: Extraocular movements intact.  ?Musculoskeletal:  ?   Comments: Gait is very antalgic.  Marked tenderness over the left hip greater trochanter bursa and this extends down the course of the IT band.  No lumbar paraspinal tenderness.  Negative logroll bilateral hips.  Negative straight leg raise.  No focal motor deficits.  ?Neurological:  ?   Mental Status: She is alert and oriented to person, place, and time.  ?Psychiatric:     ?   Mood and Affect: Mood normal.  ? ? ?Ortho Exam ? ?Specialty Comments:  ?No specialty comments available. ? ?Imaging: ?CT HIP LEFT WO CONTRAST ? ?  Result Date: 06/10/2021 ?CLINICAL DATA:  Hip pain, stress fracture suspected, neg xray pain in hip with possible fracture on x-ray EXAM: CT OF THE LEFT HIP WITHOUT CONTRAST TECHNIQUE: Multidetector CT imaging of the left hip was performed according to the standard protocol. Multiplanar CT image reconstructions were also generated. RADIATION DOSE REDUCTION: This exam was performed according to the departmental dose-optimization program which includes automated exposure control, adjustment of the mA and/or kV according to patient size and/or use of iterative reconstruction technique. COMPARISON:  Hip radiograph 06/10/2021 FINDINGS: Bones/Joint/Cartilage There is no evidence of acute hip fracture. The abnormality in question on comparison radiograph is  consistent with mild benign bony productive change at the anterior femoral head neck junction. There is mild left hip osteoarthritis. There is mild bilateral SI joint degenerative change. There is lower lumbar spine degenerative disc disease, moderate at L5-S1 with grade 1 anterolisthesis, broad-based disc bulging, and severe bilateral facet arthropathy. Ligaments Suboptimally assessed by CT. Muscles and Tendons No acute myotendinous abnormality by CT. Soft tissues No focal fluid collection. IMPRESSION: No evidence of acute left hip fracture. Mild left hip osteoarthritis. Electronically Signed   By: Maurine Simmering M.D.   On: 06/10/2021 17:22   ? ? ?PMFS History: ?Patient Active Problem List  ? Diagnosis Date Noted  ? Traumatic hematoma of left lower leg 09/26/2018  ? S/P total knee arthroplasty, left 03/29/2017  ? Spondylosis without myelopathy or radiculopathy, cervical region 03/29/2017  ? Bilateral carpal tunnel syndrome 03/29/2017  ? Acute asthma exacerbation 08/19/2016  ? Cervicalgia 03/23/2016  ? Vertigo 04/14/2015  ? Anxiety state 01/22/2015  ? HLD (hyperlipidemia) 01/22/2015  ? Type 2 diabetes mellitus with circulatory disorder ([place]) 01/22/2015  ? Intracranial vascular stenosis 01/22/2015  ? Aneurysm ([place])   ? Headache   ? Essential hypertension 11/12/2014  ? Dependent edema 11/12/2014  ? Peripheral neuropathy 11/12/2014  ? Depression 11/12/2014  ? Facial droop   ? TIA (transient ischemic attack) 11/11/2014  ? Hyperthyroidism 08/06/2014  ? Meniere's disease 06/07/2014  ? Goiter 06/13/2013  ? Obesity, unspecified 06/13/2013  ? ?Past Medical History:  ?Diagnosis Date  ? Asthma   ? Cerebral aneurysm   ? being watched by Dr. Kathee Delton every 6 months  ? Complication of anesthesia HYPOTENSION INTRAOP W/ HYSTERECTOMY IN 1987--  DID OK W/ TOTAL KNEE IN 2008  ? Diabetes mellitus   ? DJD (degenerative joint disease)   ? Dyslipidemia   ? GERD (gastroesophageal reflux disease)   ? H/O hiatal hernia   ? History of CHF  (congestive heart failure) 2010-  RESOLVED  ? History of peptic ulcer YRS AGO  ? Hypertension   ? Meniere's disease   ? Mitral valve prolapse occasional palpitations w/ chest discomfort  ? OA (osteoarthritis)   ? Rotator cuff tear, left   ? Stroke [facility])   ?  ?Family History  ?Problem Relation Age of Onset  ? Heart disease Mother   ?     in her 41s, heart attack  ? Diabetes Mother   ? Kidney disease Mother   ?     dialysis  ? Thyroid disease Mother   ? Aneurysm Mother   ? Ovarian cancer Mother   ? Breast cancer Mother   ? Thyroid disease Sister   ? Heart disease Sister   ? Lymphoma Father   ? Diabetes Father   ? Hypertension Father   ? Congestive Heart Failure Father   ? Thyroid disease Brother   ? Thyroid disease Paternal Grandmother   ?  Heart attack Sister   ?     at age 82  ? Stroke Sister   ? Hypertension Sister   ? Breast cancer Maternal Aunt   ? Breast cancer Maternal Grandmother   ? Breast cancer Maternal Aunt   ? Breast cancer Maternal Aunt   ? Other Brother   ?     Car accident  ? Bipolar disorder Son   ? Other Son   ?     GSW  ?  ?Past Surgical History:  ?Procedure Laterality Date  ? BUNIONECTOMY  2007  ? LEFT FOOT  ? IR 3D INDEPENDENT WKST  01/06/2021  ? IR ANGIO INTRA EXTRACRAN SEL COM CAROTID INNOMINATE BILAT MOD SED  03/22/2019  ? IR ANGIO INTRA EXTRACRAN SEL COM CAROTID INNOMINATE BILAT MOD SED  01/06/2021  ? IR ANGIO VERTEBRAL SEL SUBCLAVIAN INNOMINATE UNI L MOD SED  03/22/2019  ? IR ANGIO VERTEBRAL SEL VERTEBRAL UNI R MOD SED  03/22/2019  ? IR ANGIO VERTEBRAL SEL VERTEBRAL UNI R MOD SED  01/06/2021  ? IR US GUIDE VASC ACCESS RIGHT  03/22/2019  ? IR US GUIDE VASC ACCESS RIGHT  01/06/2021  ? LEFT SHOULDER SURGERY  1997  ? ROTATOR CUFF REPAIR  ? REVERSE SHOULDER ARTHROPLASTY Right 07/30/2020  ? Procedure: REVERSE SHOULDER ARTHROPLASTY;  Surgeon: Hiram Gash, MD;  Location: [facility];  Service: Orthopedics;  Laterality: Right;  ? SHOULDER ARTHROSCOPY  09/20/2011  ? Procedure: ARTHROSCOPY SHOULDER;  Surgeon:  Johnn Hai, MD;  [facility];  Service: Orthopedics;  Laterality: Left;  SAD AND MINI OPEN ROTATOR CUFF REPAIR ? ?  ? TOTAL KNEE ARTHROPLASTY  09-12-2006  [facility]  ? RIGHT KNEE  ? TOTAL KNEE ARTH

## 2021-06-11 NOTE — Telephone Encounter (Signed)
Patient states her pharm isback ordered on 5 MG hydro  ? ?Please advise ? ? ?

## 2021-06-12 ENCOUNTER — Encounter: Payer: Self-pay | Admitting: Family Medicine

## 2021-06-12 NOTE — Telephone Encounter (Signed)
This is in regard to the pts Norco 5-325 mg. Should she choose another pharmacy?  ?

## 2021-06-12 NOTE — Telephone Encounter (Signed)
Pt is calling back regarding rx. Pharmacy only has '10mg'$  or 7.5. please advise if okay to change rx.  ? ?[id number] [place] ? [address] [id number] ?Phone:  [phone] Fax:  [phone] ?

## 2021-06-15 ENCOUNTER — Ambulatory Visit: Payer: Medicare Other | Admitting: Family Medicine

## 2021-06-15 DIAGNOSIS — I729 Aneurysm of unspecified site: Secondary | ICD-10-CM

## 2021-06-15 DIAGNOSIS — Z1329 Encounter for screening for other suspected endocrine disorder: Secondary | ICD-10-CM

## 2021-06-15 DIAGNOSIS — E118 Type 2 diabetes mellitus with unspecified complications: Secondary | ICD-10-CM

## 2021-06-15 DIAGNOSIS — G459 Transient cerebral ischemic attack, unspecified: Secondary | ICD-10-CM

## 2021-06-15 DIAGNOSIS — E785 Hyperlipidemia, unspecified: Secondary | ICD-10-CM

## 2021-06-15 DIAGNOSIS — I10 Essential (primary) hypertension: Secondary | ICD-10-CM

## 2021-06-24 ENCOUNTER — Ambulatory Visit: Payer: Medicare Other | Admitting: Orthopaedic Surgery

## 2021-06-24 ENCOUNTER — Encounter: Payer: Self-pay | Admitting: Orthopaedic Surgery

## 2021-06-24 DIAGNOSIS — M7062 Trochanteric bursitis, left hip: Secondary | ICD-10-CM | POA: Diagnosis not present

## 2021-06-24 DIAGNOSIS — M7061 Trochanteric bursitis, right hip: Secondary | ICD-10-CM

## 2021-06-24 NOTE — Progress Notes (Signed)
? ?Office Visit Note ?  ?Patient: Sherry [name]           ?Date of Birth: [birth year]           ?MRN: [id number] ?Visit Date: 06/24/2021 ?             ?Requested by: Darreld Mclean, MD ?[address] ?[address] ?[place],  [place] [address] ?PCP: Darreld Mclean, MD ? ? ?Assessment & Plan: ?Visit Diagnoses: Left hip trochanteric bursitis ? ? ?Plan: Encourage patient use her cane with her history of falling and balance problems as well as peripheral neuropathy.  Fall prevention discussed.  If her trochanteric bursitis continues to improve and she wants to call in a month about setting up some physical therapy for lower extremity strengthening and balance and fall prevention D happy to do that.  In the meantime she use her cane walk more in the neighborhood. ? ?Follow-Up Instructions: No follow-ups on file.  ? ?Orders:  ?No orders of the defined types were placed in this encounter. ? ?No orders of the defined types were placed in this encounter. ? ? ? ? Procedures: ?No procedures performed ? ? ?Clinical Data: ?No additional findings. ? ? ?Subjective: ?Chief Complaint  ?Patient presents with  ? Left Leg - Pain, Follow-up  ? ?HPI: Patient had trochanteric injection done 06/11/2021 and states she is got good relief.  She has had past history of TIA right left total knee arthroplasty she has peripheral neuropathy M?ni?re's disease balance problems had a fall on Monday.  She has a cane but it is in the trunk of her car.  Most times she does not use her cane. ? ? ?Review of Systems all the systems noncontributory to HPI. ? ? ?Objective: ?Vital Signs: BP 120/75   Pulse 70   Ht '5\' 1"'$  (1.549 m)   Wt 191 lb (86.6 kg)   BMI 36.09 kg/m?  ? ?Physical Exam ?Constitutional:   ?   Appearance: She is well-developed.  ?HENT:  ?   Head: Normocephalic.  ?   Right Ear: External ear normal.  ?   Left Ear: External ear normal. There is no impacted cerumen.  ?Eyes:  ?   Pupils: Pupils are equal, round, and reactive to light.   ?Neck:  ?   Thyroid: No thyromegaly.  ?   Trachea: No tracheal deviation.  ?Cardiovascular:  ?   Rate and Rhythm: Normal rate.  ?Pulmonary:  ?   Effort: Pulmonary effort is normal.  ?Abdominal:  ?   Palpations: Abdomen is soft.  ?Musculoskeletal:  ?   Cervical back: No rigidity.  ?Skin: ?   General: Skin is warm and dry.  ?Neurological:  ?   Mental Status: She is alert and oriented to person, place, and time.  ?Psychiatric:     ?   Behavior: Behavior normal.  ? ? ?Ortho Exam well-healed right left total knee arthroplasty incisions.  No knee effusion negative logroll the hips.  Patient is slightly unsteady with gait. ? ?Specialty Comments:  ?No specialty comments available. ? ?Imaging: ?No results found. ? ? ?PMFS History: ?Patient Active Problem List  ? Diagnosis Date Noted  ? Trochanteric bursitis, left hip 06/24/2021  ? Traumatic hematoma of left lower leg 09/26/2018  ? S/P total knee arthroplasty, left 03/29/2017  ? Spondylosis without myelopathy or radiculopathy, cervical region 03/29/2017  ? Bilateral carpal tunnel syndrome 03/29/2017  ? Acute asthma exacerbation 08/19/2016  ? Cervicalgia 03/23/2016  ? Vertigo 04/14/2015  ?  Anxiety state 01/22/2015  ? HLD (hyperlipidemia) 01/22/2015  ? Type 2 diabetes mellitus with circulatory disorder ([place]) 01/22/2015  ? Intracranial vascular stenosis 01/22/2015  ? Aneurysm ([place])   ? Headache   ? Essential hypertension 11/12/2014  ? Dependent edema 11/12/2014  ? Peripheral neuropathy 11/12/2014  ? Depression 11/12/2014  ? Facial droop   ? TIA (transient ischemic attack) 11/11/2014  ? Hyperthyroidism 08/06/2014  ? Meniere's disease 06/07/2014  ? Goiter 06/13/2013  ? Obesity, unspecified 06/13/2013  ? ?Past Medical History:  ?Diagnosis Date  ? Asthma   ? Cerebral aneurysm   ? being watched by Dr. Kathee Delton every 6 months  ? Complication of anesthesia HYPOTENSION INTRAOP W/ HYSTERECTOMY IN 1987--  DID OK W/ TOTAL KNEE IN 2008  ? Diabetes mellitus   ? DJD (degenerative joint  disease)   ? Dyslipidemia   ? GERD (gastroesophageal reflux disease)   ? H/O hiatal hernia   ? History of CHF (congestive heart failure) 2010-  RESOLVED  ? History of peptic ulcer YRS AGO  ? Hypertension   ? Meniere's disease   ? Mitral valve prolapse occasional palpitations w/ chest discomfort  ? OA (osteoarthritis)   ? Rotator cuff tear, left   ? Stroke [facility])   ?  ?Family History  ?Problem Relation Age of Onset  ? Heart disease Mother   ?     in her 70s, heart attack  ? Diabetes Mother   ? Kidney disease Mother   ?     dialysis  ? Thyroid disease Mother   ? Aneurysm Mother   ? Ovarian cancer Mother   ? Breast cancer Mother   ? Thyroid disease Sister   ? Heart disease Sister   ? Lymphoma Father   ? Diabetes Father   ? Hypertension Father   ? Congestive Heart Failure Father   ? Thyroid disease Brother   ? Thyroid disease Paternal Grandmother   ? Heart attack Sister   ?     at age 38  ? Stroke Sister   ? Hypertension Sister   ? Breast cancer Maternal Aunt   ? Breast cancer Maternal Grandmother   ? Breast cancer Maternal Aunt   ? Breast cancer Maternal Aunt   ? Other Brother   ?     Car accident  ? Bipolar disorder Son   ? Other Son   ?     GSW  ?  ?Past Surgical History:  ?Procedure Laterality Date  ? BUNIONECTOMY  2007  ? LEFT FOOT  ? IR 3D INDEPENDENT WKST  01/06/2021  ? IR ANGIO INTRA EXTRACRAN SEL COM CAROTID INNOMINATE BILAT MOD SED  03/22/2019  ? IR ANGIO INTRA EXTRACRAN SEL COM CAROTID INNOMINATE BILAT MOD SED  01/06/2021  ? IR ANGIO VERTEBRAL SEL SUBCLAVIAN INNOMINATE UNI L MOD SED  03/22/2019  ? IR ANGIO VERTEBRAL SEL VERTEBRAL UNI R MOD SED  03/22/2019  ? IR ANGIO VERTEBRAL SEL VERTEBRAL UNI R MOD SED  01/06/2021  ? IR US GUIDE VASC ACCESS RIGHT  03/22/2019  ? IR US GUIDE VASC ACCESS RIGHT  01/06/2021  ? LEFT SHOULDER SURGERY  1997  ? ROTATOR CUFF REPAIR  ? REVERSE SHOULDER ARTHROPLASTY Right 07/30/2020  ? Procedure: REVERSE SHOULDER ARTHROPLASTY;  Surgeon: Hiram Gash, MD;  Location: [facility];  Service:  Orthopedics;  Laterality: Right;  ? SHOULDER ARTHROSCOPY  09/20/2011  ? Procedure: ARTHROSCOPY SHOULDER;  Surgeon: Johnn Hai, MD;  Location: [facility];  Service: Orthopedics;  Laterality: Left;  SAD AND MINI OPEN ROTATOR CUFF REPAIR ? ?  ? TOTAL KNEE ARTHROPLASTY  09-12-2006  [facility]  ? RIGHT KNEE  ? TOTAL KNEE ARTHROPLASTY Left 11/30/2017  ? Procedure: LEFT TOTAL KNEE REPLACEMENT;  Surgeon: [name], MD;  Location: [place];  Service: Orthopedics;  Laterality: Left;  ? TUBAL LIGATION  1976  ? TYMPANOPLASTY  1990;   1980  ? VAGINAL HYSTERECTOMY  1987  ? ?Social History  ? ?Occupational History  ? Occupation: LPN - retired  ?Tobacco Use  ? Smoking status: Never  ?  Passive exposure: Never  ? Smokeless tobacco: Never  ?Vaping Use  ? Vaping Use: Never used  ?Substance and Sexual Activity  ? Alcohol use: No  ? Drug use: No  ? Sexual activity: Not Currently  ? ? ? ? ? ? ?

## 2021-06-26 ENCOUNTER — Ambulatory Visit (INDEPENDENT_AMBULATORY_CARE_PROVIDER_SITE_OTHER): Payer: Medicare Other | Admitting: Pharmacist

## 2021-06-26 DIAGNOSIS — J4521 Mild intermittent asthma with (acute) exacerbation: Secondary | ICD-10-CM

## 2021-06-26 DIAGNOSIS — E118 Type 2 diabetes mellitus with unspecified complications: Secondary | ICD-10-CM

## 2021-06-26 DIAGNOSIS — G629 Polyneuropathy, unspecified: Secondary | ICD-10-CM

## 2021-06-26 DIAGNOSIS — E785 Hyperlipidemia, unspecified: Secondary | ICD-10-CM

## 2021-06-26 MED ORDER — FAMOTIDINE 40 MG PO TABS: 40.0000 mg | ORAL_TABLET | Freq: Every day | ORAL | 0 refills | Status: DC

## 2021-06-26 MED ORDER — METFORMIN HCL 500 MG PO TABS: 500.0000 mg | ORAL_TABLET | Freq: Two times a day (BID) | ORAL | 0 refills | Status: DC

## 2021-06-26 NOTE — Chronic Care Management (AMB) (Signed)
Chronic Care Management Pharmacy Note  06/26/2021 Name:  Sherry [name] MRN:  [id number] DOB:  May 03, 1950  Summary:  Type 2 DM: patient reports today that she has been experience diarrhea every day that limits her quality of life as she feels that she cannot visit with friends and family due to having to stay close to a bathroom. She is concerned it is related to metformin. Taking metformin 589m 2 tablets twice a day. Recent home blood glucose readings: 100 to 110 most of the time. Highest blood glucose 160 after receiving left hip steroid. She is taking after morning and evening meal. Also taking Jardiance 587mdaily and Tradjenta 2.17m82maily. Past due to have A1c checked. GERD: patient reports reflux despite taking famotidine 87m59mily in am prior to breakfast. Usually has reflux after breakfast.  Patient is past due to have labs checked. Labs ordered at last visit 06/10/2021 but patient was not able to return to office for blood draw after her xray.  Medication Adherence: continues to take medications as prescribed and filling on time. Using Adherence packaging with her pharmacy.   Recommendations:  Decrease dose of metformin to 500mg617make 1 tablet twice a day with food. Will check back in 1 month to see if blood glucose is still in range and if diarrhea has improved.  Increase famotidine to 40mg 59my. If not better in 1 month, then follow up PCP for evaluation. Will send Dr CoplanLorelei Pontsage to see if patient can come in for labs in the next 2 to 3 weeks or is she would like her to make an appointment.   Subjective: Sherry [name] 70 y.o43year old female who is a primary patient of Copland, JessicGay Filler The CCM team was consulted for assistance with disease management and care coordination needs.    Engaged with patient by telephone for follow up visit in response to provider referral for pharmacy case management and/or care coordination services.   Consent to  Services:  The patient was given information about Chronic Care Management services, agreed to services, and gave verbal consent prior to initiation of services.  Please see initial visit note for detailed documentation.   Patient Care Team: Copland, JessicGay Fillers PCP - General (Family Medicine) Hilty,Debara PicketttNadean Corwins PCP - Cardiology (Cardiology) EckardCherre RobinsCPP (Pharmacist)  Recent office visits: 06/10/2021 - Fam Med (Dr CoplanLorelei Pont for left knee and hip pain. History of left TKR. Ordered xray of left hip and knee. Prescribed hydrocodone/PAP 5/3217mg #28mabs to take up to every 8 hours as neede for pain. Referred to ortho.  06/04/2021 - AWV 06/03/2020 - PCP (Dr CoplandLorelei Pont-Op Evaluation for right reverse total shoulder repair; Labs checked; no medication changes.   Recent consult visits: 06/24/2021 - Ortho surgery (Dr Yates) Lorin Mercyeft hip bursitis. Reports good relief after hip injection 06/11/2021. Recommended use cane for ambulation and discuss fall prevention. If left hip pain continues to improve, will order Physical therapy in 1 month for lower extremeity strengthening, balance and fall prevention.  06/11/2021 - OrthoManson Passey,Ricard DillonSeBoazfor bursitis of left hip. Left hip injection. lidocaine bupivacaine and methylPREDNISolone acetate. F/U 2 weeks  Hospital visits: None in last 6 months  Objective:  Lab Results  Component Value Date   CREATININE 0.50 01/06/2021   CREATININE 0.71 07/24/2020   CREATININE 0.82 06/02/2020    Lab Results  Component Value Date   HGBA1C 6.6 (  H) 06/02/2020   Last diabetic Eye exam: No results found for: HMDIABEYEEXA  Last diabetic Foot exam: No results found for: HMDIABFOOTEX      Component Value Date/Time   CHOL 142 06/02/2020 0933   TRIG 96.0 06/02/2020 0933   HDL 62.10 06/02/2020 0933   CHOLHDL 2 06/02/2020 0933   VLDL 19.2 06/02/2020 0933   LDLCALC 61 06/02/2020 0933       Latest Ref Rng & Units 06/02/2020    9:33 AM  09/04/2019    2:43 PM 04/16/2019    3:56 PM  Hepatic Function  Total Protein 6.0 - 8.3 g/dL 6.8   6.6   6.4    Albumin 3.5 - 5.2 g/dL 4.0   3.6   4.1    AST 0 - 37 U/L _0 ALT 0 - 35 U/L _1 Alk Phosphatase 39 - 117 U/L 74   56   77    Total Bilirubin 0.2 - 1.2 mg/dL 0.7   0.5   0.6      Lab Results  Component Value Date/Time   TSH 1.06 06/02/2020 09:33 AM   TSH 1.320 05/16/2019 02:14 PM   FREET4 0.63 12/30/2015 02:38 PM   FREET4 0.74 01/13/2015 09:54 AM       Latest Ref Rng & Units 01/06/2021   12:17 PM 01/06/2021    9:45 AM 07/24/2020    1:38 PM  CBC  WBC 4.0 - 10.5 K/uL  6.3   6.7    Hemoglobin 12.0 - 15.0 g/dL 13.9   14.0   13.4    Hematocrit 36.0 - 46.0 % 41.0   46.2   43.3    Platelets 150 - 400 K/uL  302   280      Lab Results  Component Value Date/Time   VD25OH 25.37 (L) 06/02/2020 09:33 AM    Clinical ASCVD: Yes  The 10-year ASCVD risk score (Arnett DK, et al., 2019) is: 17.9%   Values used to calculate the score:     Age: 29 years     Sex: Female     Is Non-Hispanic African American: Yes     Diabetic: Yes     Tobacco smoker: No     Systolic Blood Pressure: 032 mmHg     Is BP treated: Yes     HDL Cholesterol: 62.1 mg/dL     Total Cholesterol: 142 mg/dL     Social History   Tobacco Use  Smoking Status Never   Passive exposure: Never  Smokeless Tobacco Never   BP Readings from Last 3 Encounters:  06/24/21 120/75  06/11/21 (!) 153/83  06/10/21 138/82   Pulse Readings from Last 3 Encounters:  06/24/21 70  06/11/21 72  06/10/21 63   Wt Readings from Last 3 Encounters:  06/24/21 191 lb (86.6 kg)  06/11/21 193 lb 9.6 oz (87.8 kg)  06/10/21 193 lb 9.6 oz (87.8 kg)    Assessment: Review of patient past medical history, allergies, medications, health status, including review of consultants reports, laboratory and other test data, was performed as part of comprehensive evaluation and provision of chronic care management  services.   SDOH:  (Social Determinants of Health) assessments and interventions performed:      CCM Care Plan  Allergies  Allergen Reactions   Influenza Vaccines Anaphylaxis   Atorvastatin Other (See Comments)    Joint pain / myalgias  Demerol Other (See Comments)    SEIZURE   Nsaids Other (See Comments)    NAPROXEN, IBUPROFEN, SKELAXIN---  CAUSES PALPITATIONS   Pravastatin Other (See Comments)    Myalgias    Prednisone Other (See Comments)    Caused her glucose to go up to 500 and went to ED   Rosuvastatin Other (See Comments)    Joint stiffness and muscle pain   Roxicodone [Oxycodone] Itching and Other (See Comments)    Hallucinations and itching   Tramadol Other (See Comments)    Contraindicated with Victoza    Codeine Itching   Mango Flavor [Flavoring Agent] Itching    Medications Reviewed Today     Reviewed by Carmine Savoy, RT (Technologist) on 06/24/21 at 67  Med List Status: <None>   Medication Order Taking? Sig Documenting Provider Last Dose Status Informant  albuterol (VENTOLIN HFA) 108 (90 Base) MCG/ACT inhaler [id number] Yes Inhale 2 puffs into the lungs every 6 (six) hours as needed for wheezing or shortness of breath. Copland, Gay Filler, MD Taking Active Self  Blood Glucose Monitoring Suppl (ONE TOUCH ULTRA 2) w/Device KIT [id number] Yes Use to check glucose up to twice daily. E11.9 dx code Copland, Gay Filler, MD Taking Active Self  cetirizine (ZYRTEC) 10 MG tablet [id number] Yes Take 1 tablet (10 mg total) by mouth daily. Copland, Gay Filler, MD Taking Active Self  clopidogrel (PLAVIX) 75 MG tablet [id number] Yes TAKE ONE TABLET BY MOUTH EVERY MORNING Copland, Gay Filler, MD Taking Active Self  empagliflozin (JARDIANCE) 10 MG TABS tablet [id number] Yes Take 1 tablet (10 mg total) by mouth daily.  Patient taking differently: Take 5 mg by mouth daily.   Copland, Gay Filler, MD Taking Active Self  famotidine (PEPCID) 20 MG tablet [id number] Yes Take 20 mg by mouth  daily.  [provider] Taking Active Self  fluticasone (FLONASE) 50 MCG/ACT nasal spray [id number] Yes Place 2 sprays into both nostrils daily. [provider] Taking Active Self  fluticasone (FLOVENT HFA) 220 MCG/ACT inhaler [id number] Yes INHALE 1 PUFF BY MOUTH INTO LUNGS TWICE DAILY. MAY INCREASE TO TWO PUFFS IF needed  Patient taking differently: Inhale 2 puffs into the lungs 2 (two) times daily as needed (shortness of breath).   Copland, Gay Filler, MD Taking Active Self  furosemide (LASIX) 20 MG tablet [id number] Yes TAKE ONE TABLET BY MOUTH EVERY MORNING Copland, Gay Filler, MD Taking Active Self  gabapentin (NEURONTIN) 300 MG capsule [id number] Yes TAKE TWO CAPSULES BY MOUTH EVERY MORNING and TAKE TWO CAPSULES BY MOUTH EVERYDAY AT BEDTIME Copland, Gay Filler, MD Taking Active Self  glucose blood (ONETOUCH VERIO) test strip [id number] Yes 1 each by Other route daily. Use as instructed [provider] Taking Active Self  ipratropium (ATROVENT) 0.03 % nasal spray [id number] Yes Place 1 spray into the nose every morning. Copland, Gay Filler, MD Taking Active Self  linagliptin (TRADJENTA) 5 MG TABS tablet [id number] Yes Take 0.5 tablets (2.5 mg total) by mouth daily. Copland, Gay Filler, MD Taking Active   lisinopril (ZESTRIL) 20 MG tablet [id number] Yes TAKE ONE TABLET BY MOUTH EVERY MORNING Copland, Gay Filler, MD Taking Active Self  metFORMIN (GLUCOPHAGE) 500 MG tablet [id number] Yes TAKE TWO TABLETS BY MOUTH EVERY MORNING and TAKE TWO TABLETS BY MOUTH EVERY EVENING Copland, Gay Filler, MD Taking Active Self  metoprolol tartrate (LOPRESSOR) 25 MG tablet [id number] Yes TAKE 1/2 TABLET BY MOUTH EVERY MORNING and TAKE 1/2 TABLET BY MOUTH EVERYDAY AT BEDTIME Ann Held, DO  Taking Active   Multiple Vitamins-Minerals (ADULT GUMMY PO) [id number] Yes Take 2 capsules by mouth daily. [provider] Taking Active Self  OneTouch Delica Lancets 70L MISC [id number] Yes Use to  check glucose up to twice daily. E11.9 dx code Copland, Gay Filler, MD Taking Active Self  Roma Schanz test strip [id number] Yes USE TO CHECK BLOOD GLUCOSE UP TO TWICE DAILY Copland, Gay Filler, MD Taking Active   Spacer/Aero-Holding [facility] [id number] Yes Use with Flovent and albuterol inhalers Ann Held, DO Taking Active   VITAMIN D PO [id number] Yes Take 1 capsule by mouth daily. [provider] Taking Active Self            Patient Active Problem List   Diagnosis Date Noted   Trochanteric bursitis, left hip 06/24/2021   Traumatic hematoma of left lower leg 09/26/2018   S/P total knee arthroplasty, left 03/29/2017   Spondylosis without myelopathy or radiculopathy, cervical region 03/29/2017   Bilateral carpal tunnel syndrome 03/29/2017   Acute asthma exacerbation 08/19/2016   Cervicalgia 03/23/2016   Vertigo 04/14/2015   Anxiety state 01/22/2015   HLD (hyperlipidemia) 01/22/2015   Type 2 diabetes mellitus with circulatory disorder ([place]) 01/22/2015   Intracranial vascular stenosis 01/22/2015   Aneurysm ([place])    Headache    Essential hypertension 11/12/2014   Dependent edema 11/12/2014   Peripheral neuropathy 11/12/2014   Depression 11/12/2014   Facial droop    TIA (transient ischemic attack) 11/11/2014   Hyperthyroidism 08/06/2014   Meniere's disease 06/07/2014   Goiter 06/13/2013   Obesity, unspecified 06/13/2013    Immunization History  Administered Date(s) Administered   PPD Test 06/29/2013, 02/18/2015, 04/25/2018   Pneumococcal Conjugate-13 12/06/2016   Pneumococcal Polysaccharide-23 11/21/2009, 02/15/2018   Tdap 09/22/2009    Conditions to be addressed/monitored: CHF, CAD, HTN, HLD, DMII, Depression and asthma; neuropathy; low serum vitamin D  There are no care plans that you recently modified to display for this patient.    Medication Assistance: None required.  Patient affirms current coverage meets needs.  Patient's preferred  pharmacy is:  [facility] - [place], [place] - [address] [place] [address] Phone: [phone] Fax: [phone]   Uses pill box? Yes - med packaged by [facility] Pt endorses 99% compliance  Follow Up:  Patient agrees to Care Plan and Follow-up.   Telephone follow up appointment with care management team member scheduled for:  2 to 3 months  [name], PharmD Clinical Pharmacist [place] [facility]

## 2021-06-26 NOTE — Patient Instructions (Signed)
Sherry [name], It was a pleasure speaking with you  Below is a summary of your health goals and care plan  Patient Goals/Self-Care Activities take medications as prescribed,  check glucose 2 to 3 times per week, document, and provide at future appointments,  check blood pressure 2 to 3 times per week, document, and provide at future appointments,  weigh daily, and contact provider if weight gain of 3lbs in 24 hours or 5lb in 1 week, and increase water intake Increase famotidine to '40mg'$  daily Decrease dose of metformin to '500mg'$  twice a day - take after morning and evening meals   If you have any questions or concerns, please feel free to contact me either at the phone number below or with a MyChart message.   Keep up the good work!  Sherry [name], PharmD Clinical Pharmacist [name] Sherry [name] [phone] (direct line)  [phone] (main office number)   Chronic Care Management Care Plan  Hypertension BP Readings from Last 3 Encounters:  06/24/21 120/75  06/11/21 (!) 153/83  06/10/21 138/82   Pharmacist Clinical Goal(s): Over the next 90 days, patient will work with PharmD and providers to maintain BP goal <140/90 Current regimen:  Lisinopril '20mg'$  daily Metoprolol '25mg'$  - take 0.5 tab = 12.'5mg'$  twice daily Furosemide '20mg'$  each morning Interventions: Requested patient to check blood pressure 3 to 4 times per week and record Forwarded request for remote blood pressure monitoring program through Sherry [name] Patient self care activities - Over the next 90 days, patient will: Check blood pressure 3 to 4 times per week, document, and provide at future appointments. I have forwarded request for remote blood pressure monitoring through Sherry [name] daily salt intake < 2300 mg/day Report symptoms of low blood pressure like dizziness or if blood pressure is less than 767 on top (systolic BP) or less than 60 on bottom (diastolic  BP)  Hyperlipidemia / history of stroke Lipid Panel     Component Value Date/Time   CHOL 142 06/02/2020 0933   TRIG 96.0 06/02/2020 0933   HDL 62.10 06/02/2020 0933   CHOLHDL 2 06/02/2020 0933   VLDL 19.2 06/02/2020 0933   LDLCALC 61 06/02/2020 0933   Pharmacist Clinical Goal(s): Over the next 90 days, patient will work with PharmD and providers to maintain LDL goal < 70 Current regimen:  Diet and exercise management for cholesterol Clopidogrel '75mg'$  daily   Interventions: Updated allergy list to include intolerance to rosuvastatin Patient self care activities - Over the next 90 days, patient will: Maintain LDL <70 Continue to take clopidogrel for stroke prevention  Diabetes Lab Results  Component Value Date/Time   HGBA1C 6.6 (H) 06/02/2020 09:33 AM   HGBA1C 6.2 04/16/2019 03:56 PM   Pharmacist Clinical Goal(s): Over the next 90 days, patient will work with PharmD and providers to maintain A1c goal <7% Current regimen:  Tradjenta '5mg'$  1/2 tab daily Jardiance '10mg'$  1/2 tab daily  Metformin '500mg'$  - take 2 tablets = '1000mg'$  twice daily Interventions: Reviewed how to identify and treat hypoglycemia (low blood sugar) Reviewed home blood glucose readings and reviewed goals  Patient self care activities - Over the next 90 days, patient will: Decrease dose of metformin to '500mg'$  twice a day - take after morning and evening meals Continue to check blood glucose daily.  Fasting blood glucose goal (before meals) = 80 to 130 Blood glucose goal after a meal = less than 180  Contact office if experience increase in low blood sugar readings (<  26)  Depression Pharmacist Clinical Goal(s) Over the next 90 days, patient will work with PharmD and providers to address depression symptoms and restart medication if needed.  Current regimen:  none Interventions: Referral to Sherry [name] social worker Patient self care activities - Over the next 90 days, patient will: Call office to discuss with Dr  Lorelei Pont if you would like to restart sertraline '25mg'$   Low Serum Vitamin D: Pharmacist Clinical Goal(s) Over the next 90 days, patient will work with PharmD and providers to increase serum vitamin D to normal range (30 to 100) Current regimen:  Vitamin D3 2000 units daily Patient self care activities - Over the next 90 days, patient will: Continue to take vitamin D daily (included in pill packs now)   Asthma:  Pharmacist Clinical Goal(s) Over the next 90 days, patient will work with PharmD and providers to improve adherence to maintenance inhaler Current regimen:  Flovent 285mg inhaler - inhale 1 puff twice a day (may increase to 2 puff twice a day if needed)  Albuterol Inhaler - inhale 2 puffs every 6 hours as needed.  Interventions: Discussed difference between maintenance / Flovent inhaler and rescue / albuterol inhaler Encouraged patient to use Flovent every day during months she is exposed to allergens.  Patient self care activities - Over the next 90 days, patient will: Use Flovent  / maintenance inhaler every day Use spacer with Flovent and albuterol   Medication management Pharmacist Clinical Goal(s): Over the next 90 days, patient will work with PharmD and providers to achieve optimal medication adherence Current pharmacy: Sherry [name] Interventions Comprehensive medication review performed. Continue medication packaging to help with medication synchronization and adherence Focus on medication adherence by filling medications appropriately  Take medications as prescribed Report any questions or concerns to PharmD and/or provider(s) Increase famotidine to '40mg'$  daily    Patient verbalizes understanding of instructions and care plan provided today and agrees to view in MyChart. Active MyChart status and patient understanding of how to access instructions and care plan via MyChart confirmed with patient.       Fall Prevention in the Home, Adult Falls can cause injuries and  affect people of all ages. There are many simple things that you can do to make your home safe and to help prevent falls. Ask for help when making these changes, if needed. What actions can I take to prevent falls? General instructions Use good lighting in all rooms. Replace any light bulbs that burn out, turn on lights if it is dark, and use night-lights. Place frequently used items in easy-to-reach places. Lower the shelves around your home if necessary. Set up furniture so that there are clear paths around it. Avoid moving your furniture around. Remove throw rugs and other tripping hazards from the floor. Avoid walking on wet floors. Fix any uneven floor surfaces. Add color or contrast paint or tape to grab bars and handrails in your home. Place contrasting color strips on the first and last steps of staircases. When you use a stepladder, make sure that it is completely opened and that the sides and supports are firmly locked. Have someone hold the ladder while you are using it. Do not climb a closed stepladder. Know where your pets are when moving through your home. What can I do in the bathroom?     Keep the floor dry. Immediately clean up any water that is on the floor. Remove soap buildup in the tub or shower regularly. Use nonskid mats or decals on the  floor of the tub or shower. Attach bath mats securely with double-sided, nonslip rug tape. If you need to sit down while you are in the shower, use a plastic, nonslip stool. Install grab bars by the toilet and in the tub and shower. Do not use towel bars as grab bars. What can I do in the bedroom? Make sure that a bedside light is easy to reach. Do not use oversized bedding that reaches the floor. Have a firm chair that has side arms to use for getting dressed. What can I do in the kitchen? Clean up any spills right away. If you need to reach for something above you, use a sturdy step stool that has a grab bar. Keep electrical  cables out of the way. Do not use floor polish or wax that makes floors slippery. If you must use wax, make sure that it is non-skid floor wax. What can I do with my stairs? Do not leave any items on the stairs. Make sure that you have a light switch at the top and the bottom of the stairs. Have them installed if you do not have them. Make sure that there are handrails on both sides of the stairs. Fix handrails that are broken or loose. Make sure that handrails are as long as the staircases. Install non-slip stair treads on all stairs in your home. Avoid having throw rugs at the top or bottom of stairs, or secure the rugs with carpet tape to prevent them from moving. Choose a carpet design that does not hide the edge of steps on the stairs. Check any carpeting to make sure that it is firmly attached to the stairs. Fix any carpet that is loose or worn. What can I do on the outside of my home? Use bright outdoor lighting. Regularly repair the edges of walkways and driveways and fix any cracks. Remove high doorway thresholds. Trim any shrubbery on the main path into your home. Regularly check that handrails are securely fastened and in good repair. Both sides of all steps should have handrails. Install guardrails along the edges of any raised decks or porches. Clear walkways of debris and clutter, including tools and rocks. Have leaves, snow, and ice cleared regularly. Use sand or salt on walkways during winter months. In the garage, clean up any spills right away, including grease or oil spills. What other actions can I take? Wear closed-toe shoes that fit well and support your feet. Wear shoes that have rubber soles or low heels. Use mobility aids as needed, such as canes, walkers, scooters, and crutches. Review your medicines with your health care provider. Some medicines can cause dizziness or changes in blood pressure, which increase your risk of falling. Talk with your health care  provider about other ways that you can decrease your risk of falls. This may include working with a physical therapist or trainer to improve your strength, balance, and endurance. Where to find more information Centers for Disease Control and Prevention, [employer]: http://www.wolf.info/ National Institute on Aging: http://kim-miller.com/ Contact a health care provider if: You are afraid of falling at home. You feel weak, drowsy, or dizzy at home. You fall at home. Summary There are many simple things that you can do to make your home safe and to help prevent falls. Ways to make your home safe include removing tripping hazards and installing grab bars in the bathroom. Ask for help when making these changes in your home. This information is not intended to replace advice given  to you by your health care provider. Make sure you discuss any questions you have with your health care provider. Document Revised: 10/27/2020 Document Reviewed: 08/29/2019 Elsevier Patient Education  [place].

## 2021-07-08 DIAGNOSIS — E785 Hyperlipidemia, unspecified: Secondary | ICD-10-CM | POA: Diagnosis not present

## 2021-07-08 DIAGNOSIS — Z7984 Long term (current) use of oral hypoglycemic drugs: Secondary | ICD-10-CM

## 2021-07-08 DIAGNOSIS — I10 Essential (primary) hypertension: Secondary | ICD-10-CM | POA: Diagnosis not present

## 2021-07-08 DIAGNOSIS — E118 Type 2 diabetes mellitus with unspecified complications: Secondary | ICD-10-CM | POA: Diagnosis not present

## 2021-07-17 ENCOUNTER — Ambulatory Visit (INDEPENDENT_AMBULATORY_CARE_PROVIDER_SITE_OTHER): Payer: Medicare Other | Admitting: Pharmacist

## 2021-07-17 DIAGNOSIS — E785 Hyperlipidemia, unspecified: Secondary | ICD-10-CM

## 2021-07-17 DIAGNOSIS — J4521 Mild intermittent asthma with (acute) exacerbation: Secondary | ICD-10-CM

## 2021-07-17 DIAGNOSIS — E118 Type 2 diabetes mellitus with unspecified complications: Secondary | ICD-10-CM

## 2021-07-17 DIAGNOSIS — R7989 Other specified abnormal findings of blood chemistry: Secondary | ICD-10-CM

## 2021-07-17 MED ORDER — METFORMIN HCL ER 500 MG PO TB24
1000.0000 mg | ORAL_TABLET | Freq: Two times a day (BID) | ORAL | 2 refills | Status: DC
Start: 2021-07-17 — End: 2021-09-14

## 2021-07-17 NOTE — Patient Instructions (Signed)
Ms Viereck It was a pleasure speaking with you  Below is a summary of your health goals and care plan   Patient Goals/Self-Care Activities take medications as prescribed,  check glucose 2 to 3 times per week, document, and provide at future appointments,  check blood pressure 2 to 3 times per week, document, and provide at future appointments,  weigh daily, and contact provider if weight gain of 3lbs in 24 hours or 5lb in 1 week, and increase water intake Your metformin will be different - changing to the extended release formula - metformin Er '500mg'$  - take 2 tablets daily with food.  Appointment for labs made for 07/20/2021 at 9:45am   If you have any questions or concerns, please feel free to contact me either at the phone number below or with a MyChart message.   Keep up the good work!  [name], PharmD Clinical Pharmacist [place] [phone] (direct line)  [phone] (main office number)   Patient verbalizes understanding of instructions and care plan provided today and agrees to view in [place]. Active MyChart status and patient understanding of how to access instructions and care plan via MyChart confirmed with patient.

## 2021-07-17 NOTE — Chronic Care Management (AMB) (Signed)
Chronic Care Management Pharmacy Note  07/17/2021 Name:  Sherry [name] MRN:  [id number] DOB:  Aug 13, 1950  Summary:  Type 2 DM: At our last visit 3 weeks ago, decreased dose of metformin to 553m - take 1 tablet twice a day with food because patient was experience diarrhea with taking 10090mtwice a day. She reports diarrhea improved with lower dose but blood glucose increased to 160's after about 1 week of lower dose so she restarted take 2 tablets twice a day. Also taking Jardiance 26m41maily and Tradjenta 2.26mg29mily. Past due to have A1c checked. GERD: patient reports reflux improved greatly with increased dose of famotidine to 40mg41mly.  Patient is past due to have labs checked. Labs ordered at last visit 06/10/2021 but patient was not able to return to office for blood draw after her xray.  Lab order placed today and patient given appointment for 07/20/2021 at 9:45am Medication Adherence: continues to take medications as prescribed and filling on time. Using Adherence packaging with her pharmacy.   Recommendations:  Will try extended release metformin 500mg 42m take 2 tablets twice a day - updated rx sent to pharmacy.   Continue famotidine to 40mg d61m.  Lab order placed today and patient given appointment for 07/20/2021 at 9:45am  Subjective: Sherry THRESHER71 y.o.62ear old female who is a primary patient of Copland, JessicaGay FillerThe CCM team was consulted for assistance with disease management and care coordination needs.    Engaged with patient by telephone for follow up visit in response to provider referral for pharmacy case management and/or care coordination services.   Consent to Services:  The patient was given information about Chronic Care Management services, agreed to services, and gave verbal consent prior to initiation of services.  Please see initial visit note for detailed documentation.   Patient Care Team: Copland, JessicaGay Filler PCP -  General (Family Medicine) Hilty, Debara PicketthNadean Corwin PCP - Cardiology (Cardiology) ,Cherre RobinsPP (Pharmacist)  Recent office visits: 06/10/2021 - Fam Med (Dr CoplandLorelei Pontfor left knee and hip pain. History of left TKR. Ordered xray of left hip and knee. Prescribed hydrocodone/PAP 5/3226mg #124mbs to take up to every 8 hours as neede for pain. Referred to ortho.  06/04/2021 - AWV 06/03/2020 - PCP (Dr Copland)Lorelei PontOp Evaluation for right reverse total shoulder repair; Labs checked; no medication changes.   Recent consult visits: 06/24/2021 - Ortho surgery (Dr Yates) FLorin Mercyft hip bursitis. Reports good relief after hip injection 06/11/2021. Recommended use cane for ambulation and discuss fall prevention. If left hip pain continues to improve, will order Physical therapy in 1 month for lower extremeity strengthening, balance and fall prevention.  06/11/2021 - Ortho Manson Passey Ricard DilloneeBrantleyor bursitis of left hip. Left hip injection. lidocaine bupivacaine and methylPREDNISolone acetate. F/U 2 weeks  Hospital visits: None in last 6 months  Objective:  Lab Results  Component Value Date   CREATININE 0.50 01/06/2021   CREATININE 0.71 07/24/2020   CREATININE 0.82 06/02/2020    Lab Results  Component Value Date   HGBA1C 6.6 (H) 06/02/2020   Last diabetic Eye exam: No results found for: "HMDIABEYEEXA"  Last diabetic Foot exam: No results found for: "HMDIABFOOTEX"      Component Value Date/Time   CHOL 142 06/02/2020 0933   TRIG 96.0 06/02/2020 0933   HDL 62.10 06/02/2020 0933   CHOLHDL 2 06/02/2020 0933   VLDL 19.2  06/02/2020 0933   LDLCALC 61 06/02/2020 0933       Latest Ref Rng & Units 06/02/2020    9:33 AM 09/04/2019    2:43 PM 04/16/2019    3:56 PM  Hepatic Function  Total Protein 6.0 - 8.3 g/dL 6.8  6.6  6.4   Albumin 3.5 - 5.2 g/dL 4.0  3.6  4.1   AST 0 - 37 U/L _0 ALT 0 - 35 U/L _1 Alk Phosphatase 39 - 117 U/L 74  56  77   Total Bilirubin 0.2 - 1.2  mg/dL 0.7  0.5  0.6     Lab Results  Component Value Date/Time   TSH 1.06 06/02/2020 09:33 AM   TSH 1.320 05/16/2019 02:14 PM   FREET4 0.63 12/30/2015 02:38 PM   FREET4 0.74 01/13/2015 09:54 AM       Latest Ref Rng & Units 01/06/2021   12:17 PM 01/06/2021    9:45 AM 07/24/2020    1:38 PM  CBC  WBC 4.0 - 10.5 K/uL  6.3  6.7   Hemoglobin 12.0 - 15.0 g/dL 13.9  14.0  13.4   Hematocrit 36.0 - 46.0 % 41.0  46.2  43.3   Platelets 150 - 400 K/uL  302  280     Lab Results  Component Value Date/Time   VD25OH 25.37 (L) 06/02/2020 09:33 AM    Clinical ASCVD: Yes  The 10-year ASCVD risk score (Arnett DK, et al., 2019) is: 17.9%   Values used to calculate the score:     Age: 71 years     Sex: Female     Is Non-Hispanic African American: Yes     Diabetic: Yes     Tobacco smoker: No     Systolic Blood Pressure: 767 mmHg     Is BP treated: Yes     HDL Cholesterol: 62.1 mg/dL     Total Cholesterol: 142 mg/dL     Social History   Tobacco Use  Smoking Status Never   Passive exposure: Never  Smokeless Tobacco Never   BP Readings from Last 3 Encounters:  06/24/21 120/75  06/11/21 (!) 153/83  06/10/21 138/82   Pulse Readings from Last 3 Encounters:  06/24/21 70  06/11/21 72  06/10/21 63   Wt Readings from Last 3 Encounters:  06/24/21 191 lb (86.6 kg)  06/11/21 193 lb 9.6 oz (87.8 kg)  06/10/21 193 lb 9.6 oz (87.8 kg)    Assessment: Review of patient past medical history, allergies, medications, health status, including review of consultants reports, laboratory and other test data, was performed as part of comprehensive evaluation and provision of chronic care management services.   SDOH:  (Social Determinants of Health) assessments and interventions performed:      CCM Care Plan  Allergies  Allergen Reactions   Influenza Vaccines Anaphylaxis   Atorvastatin Other (See Comments)    Joint pain / myalgias   Demerol Other (See Comments)    SEIZURE   Nsaids Other  (See Comments)    NAPROXEN, IBUPROFEN, SKELAXIN---  CAUSES PALPITATIONS   Pravastatin Other (See Comments)    Myalgias    Prednisone Other (See Comments)    Caused her glucose to go up to 500 and went to ED   Rosuvastatin Other (See Comments)    Joint stiffness and muscle pain   Roxicodone [Oxycodone] Itching and Other (See Comments)    Hallucinations and itching   Tramadol  Other (See Comments)    Contraindicated with Victoza    Codeine Itching   Mango Flavor [Flavoring Agent] Itching    Medications Reviewed Today     Reviewed by [name], RPH-CPP (Pharmacist) on 07/17/21 at 53  Med List Status: <None>   Medication Order Taking? Sig Documenting Provider Last Dose Status Informant  albuterol (VENTOLIN HFA) 108 (90 Base) MCG/ACT inhaler [id number] Yes Inhale 2 puffs into the lungs every 6 (six) hours as needed for wheezing or shortness of breath. Copland, Gay Filler, MD Taking Active Self  Blood Glucose Monitoring Suppl (ONE TOUCH ULTRA 2) w/Device KIT [id number] Yes Use to check glucose up to twice daily. E11.9 dx code Copland, Gay Filler, MD Taking Active Self  cetirizine (ZYRTEC) 10 MG tablet [id number] Yes Take 1 tablet (10 mg total) by mouth daily. Copland, Gay Filler, MD Taking Active Self  clopidogrel (PLAVIX) 75 MG tablet [id number] Yes TAKE ONE TABLET BY MOUTH EVERY MORNING Copland, Gay Filler, MD Taking Active Self  empagliflozin (JARDIANCE) 10 MG TABS tablet [id number] Yes Take 1 tablet (10 mg total) by mouth daily.  Patient taking differently: Take 5 mg by mouth daily.   Copland, Gay Filler, MD Taking Active Self  famotidine (PEPCID) 40 MG tablet [id number] Yes Take 1 tablet (40 mg total) by mouth daily. Copland, Gay Filler, MD Taking Active   fluticasone (FLONASE) 50 MCG/ACT nasal spray [id number] Yes Place 2 sprays into both nostrils daily. [provider] Taking Active Self  fluticasone (FLOVENT HFA) 220 MCG/ACT inhaler [id number] Yes INHALE 1 PUFF BY MOUTH INTO LUNGS  TWICE DAILY. MAY INCREASE TO TWO PUFFS IF needed  Patient taking differently: Inhale 2 puffs into the lungs 2 (two) times daily as needed (shortness of breath).   Copland, Gay Filler, MD Taking Active Self  furosemide (LASIX) 20 MG tablet [id number] Yes TAKE ONE TABLET BY MOUTH EVERY MORNING Copland, Gay Filler, MD Taking Active Self  gabapentin (NEURONTIN) 300 MG capsule [id number] Yes TAKE TWO CAPSULES BY MOUTH EVERY MORNING and TAKE TWO CAPSULES BY MOUTH EVERYDAY AT BEDTIME Copland, Gay Filler, MD Taking Active Self  glucose blood (ONETOUCH VERIO) test strip [id number] Yes 1 each by Other route daily. Use as instructed [provider] Taking Active Self  ipratropium (ATROVENT) 0.03 % nasal spray [id number] Yes Place 1 spray into the nose every morning. Copland, Gay Filler, MD Taking Active Self  linagliptin (TRADJENTA) 5 MG TABS tablet [id number] Yes Take 0.5 tablets (2.5 mg total) by mouth daily. Copland, Gay Filler, MD Taking Active   lisinopril (ZESTRIL) 20 MG tablet [id number] Yes TAKE ONE TABLET BY MOUTH EVERY MORNING Copland, Gay Filler, MD Taking Active Self  metFORMIN (GLUCOPHAGE) 500 MG tablet [id number] Yes Take 1 tablet (500 mg total) by mouth 2 (two) times daily with a meal.  Patient taking differently: Take 1,000 mg by mouth 2 (two) times daily with a meal.   Copland, Gay Filler, MD Taking Active   metoprolol tartrate (LOPRESSOR) 25 MG tablet [id number] Yes TAKE 1/2 TABLET BY MOUTH EVERY MORNING and TAKE 1/2 TABLET BY MOUTH EVERYDAY AT BEDTIME Roma Schanz R, DO Taking Active   Multiple Vitamins-Minerals (ADULT GUMMY PO) [id number] Yes Take 2 capsules by mouth daily. [provider] Taking Active Self  OneTouch Delica Lancets 35K MISC [id number] Yes Use to check glucose up to twice daily. E11.9 dx code Copland, Gay Filler, MD Taking Active Self  ONETOUCH VERIO test strip [id number] Yes USE TO CHECK BLOOD GLUCOSE UP TO TWICE DAILY Copland,  Gay Filler, MD Taking Active    Spacer/Aero-Holding [facility] [id number] Yes Use with Flovent and albuterol inhalers Ann Held, DO Taking Active   VITAMIN D PO [id number] Yes Take 1 capsule by mouth daily. [provider] Taking Active Self            Patient Active Problem List   Diagnosis Date Noted   Trochanteric bursitis, left hip 06/24/2021   Traumatic hematoma of left lower leg 09/26/2018   S/P total knee arthroplasty, left 03/29/2017   Spondylosis without myelopathy or radiculopathy, cervical region 03/29/2017   Bilateral carpal tunnel syndrome 03/29/2017   Acute asthma exacerbation 08/19/2016   Cervicalgia 03/23/2016   Vertigo 04/14/2015   Anxiety state 01/22/2015   HLD (hyperlipidemia) 01/22/2015   Type 2 diabetes mellitus with circulatory disorder ([place]) 01/22/2015   Intracranial vascular stenosis 01/22/2015   Aneurysm ([place])    Headache    Essential hypertension 11/12/2014   Dependent edema 11/12/2014   Peripheral neuropathy 11/12/2014   Depression 11/12/2014   Facial droop    TIA (transient ischemic attack) 11/11/2014   Hyperthyroidism 08/06/2014   Meniere's disease 06/07/2014   Goiter 06/13/2013   Obesity, unspecified 06/13/2013    Immunization History  Administered Date(s) Administered   PPD Test 06/29/2013, 02/18/2015, 04/25/2018   Pneumococcal Conjugate-13 12/06/2016   Pneumococcal Polysaccharide-23 11/21/2009, 02/15/2018   Tdap 09/22/2009    Conditions to be addressed/monitored: CHF, CAD, HTN, HLD, DMII, Depression and asthma; neuropathy; low serum vitamin D  Care Plan : General Pharmacy (Adult)  Updates made by [name], RPH-CPP since 07/17/2021 12:00 AM     Problem: type 2 DM; HTN; h/o stroke; CAD; asthma   Priority: High  Onset Date: 06/26/2020  Note:   Current Barriers:  Possible side effect to metformin therapy Reflux not adequately controlled (improved) Due to have labs rechecked  Pharmacist Clinical Goal(s):  Over the next 180 days,  patient will maintain control of type 2 DM and HTN as evidenced by A1c < 7.0 and BP < 140/90  adhere to prescribed medication regimen as evidenced by fill history through collaboration with PharmD and provider.  Assess potential side effects of metformin and take steps to lower side effect risk. Decrease symptom of GERD  Interventions: 1:1 collaboration with Copland, Gay Filler, MD regarding development and update of comprehensive plan of care as evidenced by provider attestation and co-signature Inter-disciplinary care team collaboration (see longitudinal plan of care) Comprehensive medication review performed; medication list updated in electronic medical record  Hypertension / CHF BP Readings from Last 3 Encounters:  06/24/21 120/75  06/11/21 (!) 153/83  06/10/21 138/82   Improving; controlled per home blood pressure readings;  BP goal <140/90 Current regimen:  Lisinopril 59m daily Metoprolol tartrate 2264m- take 0.5 tab = 12.64m2mwice daily Furosemide 8m68mth breakfast Home blood pressure has been 110 to 125 / 70's per patient Denies edema in legs Interventions: Continue to check blood pressure at home and record for future appointments Continue current medications for heart / blood pressure  Hyperlipidemia / history of TIA LDL goal is <70 - patient is at goal without pharmacotherapy Current regimen:  Diet and exercise management for cholesterol Clopidogrel 764mg44mly   Statin intolerant Statins tried: rosuvastatin 64mg 332mmes per week - caused stiffness and muscle pain which patient felt lead to fall; atorvastatin and pravastatin - myalgias.  Interventions: Continue to limit high fat / cholesterol foods Plan to restart physical therapy once approved by orthopedist - goal  is to work up to at least 150 minutes of exercise per week as able.  Continue to take clopidogrel for stroke prevention Due to recheck lipids   Diabetes Controlled; last A1c at goal of < 7.0% Checking  BG at home 2 to 3 times per week. Ranges from 100 to 110 mostly but has one after steroid injection of 160 Current regimen:  Tradjenta 24m 1/2 tablet = 2.536mdaily Jardiance 1078m/2 tablet = 5mg27mily  Metformin 500mg75make 2 tablets = 1000mg 38me daily Patient reports increase in loose stools / diarrhea that are limiting her ability to leave home. Suspect related to metformin. Tried lower dose of metformin 500mg t79m a day but patient reports hoome blood glucose increased to 160's so she restarted taking 2 tablets = 1000mg tw74ma day. Interventions: Given that she has CHF, would like to have her on full 10mg of 25miance or 25mg. Ful39mse Jardiance could also help with blood pressure and edema though home blood pressure has been at goal and patient denies edema today. Will continue to monitor.  Change metformin to ER formula 500mg - tak95mtablets twice a day. Updated prescription at pharmacy.  If continues to have diarrhea with ER formula could try change to [facility]ardy [facility] Center[facility]ardiance and metformin) with higher dose of Jardiance.  Contact office if experience increase in low blood sugar readings (<80) Reviewed home blood glucose readings and reviewed goals  Fasting blood glucose goal (before meals) = 80 to 130 Blood glucose goal after a meal = less than 180    Depression Patient's grandson was murdered / shot in January 2022. She also lost a son about 20 years ago to gun violence.  She states she does belong to a [employer] been helpful.  She reports she has good support from sister, Connie TillEmelia Loronher grandchildren (has 17 grandchi4ren) She has taken sertraline 25mg daily 34mquetiapine 25mg daily i22mst States she stopped because she did not feel she needed and she was having tardive dyskinesia symptoms Current regimen:  none Interventions: (addressed at previous visit) Continue to engage with LCSW   Low Serum Vitamin D: Last serum  vitamin D was 25.37 (06/02/2020) Current regimen:  Vitamin D3 2000 units daily - started after labs from 06/02/2020 Interventions: Coordinated with pharmacy to have vitamin D3 added to her pill packages. (Completed)   Asthma:  Rarely uses albuterol inhaler Still uses Flovent as needed  Recent increase in use of rescue inhaler due to COVID infection. Patient still have symptoms.  Pulse ox per patient has been 98 to 99% Taylor, Beck,Caleen Jobsffice is planning to send in prescription for prednisone for 5 days.  Patient states received her new spacer with last prescriptions  Current regimen:  Flovent 220mcg inhaler22mnhale 1 puff twice a day (may increase to 2 puff twice a day if needed)  Albuterol Inhaler - inhale 2 puffs every 6 hours as needed.  Interventions:  Discussed difference between maintenance / Flovent inhaler and rescue / albuterol inhaler Encouraged patient to use Flovent every day    Medication management Current pharmacy: [facility] Interventions Comprehensive medication review performed. Utilize [facility] pharmacy for medication synchronization, packaging and delivery Reviewed refill records and patient has fill maintenance medications on time.  Coordinated with [facility] to change to metformin ER 500mg - take 2 63mets twice a day with food.  Patient Goals/Self-Care Activities Over the next 90 days, patient  will:  take medications as prescribed,  check glucose 2 to 3 times per week, document, and provide at future appointments,  check blood pressure 2 to 3 times per week, document, and provide at future appointments,  weigh daily, and contact provider if weight gain of 3lbs in 24 hours or 5lb in 1 week, and increase water intake Your metformin will be different - changing to the extended release formula - metformin Er 537m - take 2 tablets daily with food.  Appointment for labs made for 07/20/2021 at 9:45am  Follow Up Plan: Telephone follow up appointment with care  management team member scheduled for:  4 to 6  weeks.            Medication Assistance: None required.  Patient affirms current coverage meets needs.  Patient's preferred pharmacy is:  [facility] [place] [address] [id number]: [phone]: [phone]  Uses pill box? Yes - med packaged by [facility] Pt endorses 99% compliance  Follow Up:  Patient agrees to Care Plan and Follow-up.   Telephone follow up appointment with care management team member scheduled for:  4 to 6 weeks  TCherre Robins PharmD Clinical Pharmacist [facility]

## 2021-07-20 ENCOUNTER — Other Ambulatory Visit (INDEPENDENT_AMBULATORY_CARE_PROVIDER_SITE_OTHER): Payer: Medicare Other

## 2021-07-20 DIAGNOSIS — E785 Hyperlipidemia, unspecified: Secondary | ICD-10-CM | POA: Diagnosis not present

## 2021-07-20 DIAGNOSIS — R7989 Other specified abnormal findings of blood chemistry: Secondary | ICD-10-CM | POA: Diagnosis not present

## 2021-07-20 DIAGNOSIS — E118 Type 2 diabetes mellitus with unspecified complications: Secondary | ICD-10-CM

## 2021-07-20 LAB — LIPID PANEL
Cholesterol: 143 mg/dL (ref 0–200)
HDL: 68.6 mg/dL (ref >39.00)
LDL Cholesterol: 60 mg/dL (ref 0–99)
NonHDL: 74.15
Total CHOL/HDL Ratio: 2
Triglycerides: 71 mg/dL (ref 0.0–149.0)
VLDL: 14.2 mg/dL (ref 0.0–40.0)

## 2021-07-20 LAB — COMPREHENSIVE METABOLIC PANEL
ALT: 10 U/L (ref 0–35)
AST: 11 U/L (ref 0–37)
Albumin: 4.2 g/dL (ref 3.5–5.2)
Alkaline Phosphatase: 69 U/L (ref 39–117)
BUN: 19 mg/dL (ref 6–23)
CO2: 29 mEq/L (ref 19–32)
Calcium: 9.4 mg/dL (ref 8.4–10.5)
Chloride: 103 mEq/L (ref 96–112)
Creatinine, Ser: 0.78 mg/dL (ref 0.40–1.20)
GFR: 76.89 mL/min (ref >60.00)
Glucose, Bld: 118 mg/dL — ABNORMAL HIGH (ref 70–99)
Potassium: 4.3 mEq/L (ref 3.5–5.1)
Sodium: 141 mEq/L (ref 135–145)
Total Bilirubin: 0.5 mg/dL (ref 0.2–1.2)
Total Protein: 6.4 g/dL (ref 6.0–8.3)

## 2021-07-20 LAB — HEMOGLOBIN A1C: Hgb A1c MFr Bld: 6.6 % — ABNORMAL HIGH (ref 4.6–6.5)

## 2021-07-20 LAB — VITAMIN D 25 HYDROXY (VIT D DEFICIENCY, FRACTURES): VITD: 42.21 ng/mL (ref 30.00–100.00)

## 2021-07-20 NOTE — Progress Notes (Signed)
Vitamin D, A1c, lipids and BMP all at goal / within normal limits. No medication changes recommended. Forwarding to PCP for review as well. Left message on patient's voicemail about results and also sent to [place].

## 2021-07-21 ENCOUNTER — Other Ambulatory Visit: Payer: Self-pay | Admitting: Family Medicine

## 2021-07-21 DIAGNOSIS — E118 Type 2 diabetes mellitus with unspecified complications: Secondary | ICD-10-CM

## 2021-08-07 DIAGNOSIS — Z7984 Long term (current) use of oral hypoglycemic drugs: Secondary | ICD-10-CM

## 2021-08-07 DIAGNOSIS — I110 Hypertensive heart disease with heart failure: Secondary | ICD-10-CM

## 2021-08-07 DIAGNOSIS — E785 Hyperlipidemia, unspecified: Secondary | ICD-10-CM

## 2021-08-07 DIAGNOSIS — E1159 Type 2 diabetes mellitus with other circulatory complications: Secondary | ICD-10-CM | POA: Diagnosis not present

## 2021-08-07 DIAGNOSIS — I509 Heart failure, unspecified: Secondary | ICD-10-CM | POA: Diagnosis not present

## 2021-08-07 DIAGNOSIS — J45909 Unspecified asthma, uncomplicated: Secondary | ICD-10-CM

## 2021-08-07 DIAGNOSIS — F32A Depression, unspecified: Secondary | ICD-10-CM

## 2021-08-12 ENCOUNTER — Other Ambulatory Visit: Payer: Self-pay | Admitting: Family Medicine

## 2021-08-12 DIAGNOSIS — E118 Type 2 diabetes mellitus with unspecified complications: Secondary | ICD-10-CM

## 2021-09-04 ENCOUNTER — Ambulatory Visit (INDEPENDENT_AMBULATORY_CARE_PROVIDER_SITE_OTHER): Payer: Medicare Other | Admitting: Pharmacist

## 2021-09-04 DIAGNOSIS — E785 Hyperlipidemia, unspecified: Secondary | ICD-10-CM

## 2021-09-04 DIAGNOSIS — E118 Type 2 diabetes mellitus with unspecified complications: Secondary | ICD-10-CM

## 2021-09-04 DIAGNOSIS — R7989 Other specified abnormal findings of blood chemistry: Secondary | ICD-10-CM

## 2021-09-04 DIAGNOSIS — J4521 Mild intermittent asthma with (acute) exacerbation: Secondary | ICD-10-CM

## 2021-09-04 NOTE — Patient Instructions (Signed)
Mrs. Shuttleworth I was so glad to speak with you today.  Below is a summary of your health goals and our recent visit. You can also view your update Chronic Care Management Care plan through your MyChart account.   Patient Goals/Self-Care Activities take medications as prescribed,  check glucose 2 to 3 times per week, document, and provide at future appointments,  check blood pressure 2 to 3 times per week, document, and provide at future appointments,  weigh daily, and contact provider if weight gain of 3lbs in 24 hours or 5lb in 1 week, and increase water intake Your next packaging will have different metformin ER '750mg'$  - take 1 tablet twice a day (total daily dose will change from '2000mg'$  per day to '1500mg'$  per day)   As always if you have any questions or concerns especially regarding medications, please feel free to contact me either at the phone number below or with a MyChart message.   Keep up the good work!  [name], PharmD Clinical Pharmacist [facility] [place] [facility] [phone] (direct line)  [phone] (main office number)   Patient verbalizes understanding of instructions and care plan provided today and agrees to view in [place]. Active MyChart status and patient understanding of how to access instructions and care plan via MyChart confirmed with patient.

## 2021-09-04 NOTE — Chronic Care Management (AMB) (Signed)
Chronic Care Management Pharmacy Note  09/04/2021 Name:  Sherry [name] MRN:  [id number] DOB:  [birth year]  Summary:  Type 2 DM:  Controlled with last A1c being 6.6 % but even with change to metofmrin ER 534m 2 tabs bid with food, she is still experience daily diarrhea. Discussed lowering dose back to 5066mbid and increasing Jardiance to  full 1026m tablet daily but patient states full 66m53mse cause frequent urination.    At our last visit 3 weeks ago, decreased dose of metformin to 500mg26make 1 tablet twice a day with food because patient was experience diarrhea with taking 1000mg 5me a day. She reports diarrhea improved with lower dose but blood glucose increased to 160's after about 1 week of lower dose so she restarted take 2 tablets twice a day. Also taking Jardiance 5mg da60m and Tradjenta 2.5mg dai74m Past due to have A1c checked. GERD: patient reports reflux improved greatly with increased dose of famotidine to 40mg dai52m Medication Adherence: continues to take medications as prescribed and filling on time. Using Adherence packaging with her pharmacy.  Coordinated refill for Flovent inhaler with pharmacy.   Recommendations:  Plan to change to Metformin ER 750mg twic4mday when she starts her new packaging. Patient asked to wait to send in Rx until around 09/17/2021. Continue famotidine to 40mg daily31mab order placed today and patient given appointment for 07/20/2021 at 9:45am  Subjective: Sherry [name].o. yea71old female who is a primary patient of Copland, Sherry [name], Gay FillerCCM team was consulted for assistance with disease management and care coordination needs.    Engaged with patient by telephone for follow up visit in response to provider referral for pharmacy case management and/or care coordination services.   Consent to Services:  The patient was given information about Chronic Care Management services, agreed to services, and gave verbal  consent prior to initiation of services.  Please see initial visit note for detailed documentation.   Patient Care Team: Copland, Sherry [name], Gay Filler - General (Family Medicine) Hilty, KennDebara Pickett Nadean Corwin - Cardiology (Cardiology) , TamCherre RobinsPharmacist)  Recent office visits: 06/10/2021 - Fam Med (Dr Copland) seLorelei Pontleft knee and hip pain. History of left TKR. Ordered xray of left hip and knee. Prescribed hydrocodone/PAP 5/325mg #15 ta22mo take up to every 8 hours as neede for pain. Referred to ortho.  06/04/2021 - AWV 06/03/2020 - PCP (Dr Copland) - PLorelei Pontvaluation for right reverse total shoulder repair; Labs checked; no medication changes.   Recent consult visits: 06/24/2021 - Ortho surgery (Dr Yates) F/U lLorin Mercyip bursitis. Reports good relief after hip injection 06/11/2021. Recommended use cane for ambulation and discuss fall prevention. If left hip pain continues to improve, will order Physical therapy in 1 month for lower extremeity strengthening, balance and fall prevention.  06/11/2021 - [place] ([place] [place]. Left hip injection. lidocaine bupivacaine and methylPREDNISolone acetate. F/U 2 weeks  Hospital visits: None in last 6 months  Objective:  Lab Results  Component Value Date   CREATININE 0.78 07/20/2021   CREATININE 0.50 01/06/2021   CREATININE 0.71 07/24/2020    Lab Results  Component Value Date   HGBA1C 6.6 (H) 07/20/2021   Last diabetic Eye exam: No results found for: "HMDIABEYEEXA"  Last diabetic Foot exam: No results found for: "HMDIABFOOTEX"      Component Value Date/Time  CHOL 143 07/20/2021 0938   TRIG 71.0 07/20/2021 0938   HDL 68.60 07/20/2021 0938   CHOLHDL 2 07/20/2021 0938   VLDL 14.2 07/20/2021 0938   LDLCALC 60 07/20/2021 0938       Latest Ref Rng & Units 07/20/2021    9:38 AM 06/02/2020    9:33 AM 09/04/2019    2:43 PM  Hepatic Function  Total Protein 6.0 - 8.3 g/dL 6.4  6.8  6.6   Albumin 3.5  - 5.2 g/dL 4.2  4.0  3.6   AST 0 - 37 U/L _0 ALT 0 - 35 U/L _1 Alk Phosphatase 39 - 117 U/L 69  74  56   Total Bilirubin 0.2 - 1.2 mg/dL 0.5  0.7  0.5     Lab Results  Component Value Date/Time   TSH 1.06 06/02/2020 09:33 AM   TSH 1.320 05/16/2019 02:14 PM   FREET4 0.63 12/30/2015 02:38 PM   FREET4 0.74 01/13/2015 09:54 AM       Latest Ref Rng & Units 01/06/2021   12:17 PM 01/06/2021    9:45 AM 07/24/2020    1:38 PM  CBC  WBC 4.0 - 10.5 K/uL  6.3  6.7   Hemoglobin 12.0 - 15.0 g/dL 13.9  14.0  13.4   Hematocrit 36.0 - 46.0 % 41.0  46.2  43.3   Platelets 150 - 400 K/uL  302  280     Lab Results  Component Value Date/Time   VD25OH 42.21 07/20/2021 09:38 AM   VD25OH 25.37 (L) 06/02/2020 09:33 AM    Clinical ASCVD: Yes  The 10-year ASCVD risk score (Arnett DK, et al., 2019) is: 18.2%   Values used to calculate the score:     Age: 71 years     Sex: Female     Is Non-Hispanic African American: Yes     Diabetic: Yes     Tobacco smoker: No     Systolic Blood Pressure: 473 mmHg     Is BP treated: Yes     HDL Cholesterol: 68.6 mg/dL     Total Cholesterol: 143 mg/dL     Social History   Tobacco Use  Smoking Status Never   Passive exposure: Never  Smokeless Tobacco Never   BP Readings from Last 3 Encounters:  06/24/21 120/75  06/11/21 (!) 153/83  06/10/21 138/82   Pulse Readings from Last 3 Encounters:  06/24/21 70  06/11/21 72  06/10/21 63   Wt Readings from Last 3 Encounters:  06/24/21 191 lb (86.6 kg)  06/11/21 193 lb 9.6 oz (87.8 kg)  06/10/21 193 lb 9.6 oz (87.8 kg)    Assessment: Review of patient past medical history, allergies, medications, health status, including review of consultants reports, laboratory and other test data, was performed as part of comprehensive evaluation and provision of chronic care management services.   SDOH:  (Social Determinants of Health) assessments and interventions performed:  SDOH Interventions     Flowsheet Row Most Recent Value  SDOH Interventions   Physical Activity Interventions Other (Comments)  [Discussed restarting walking when weather is cooler]         CCM Care Plan  Allergies  Allergen Reactions   Influenza Vaccines Anaphylaxis   Atorvastatin Other (See Comments)    Joint pain / myalgias   Demerol Other (See Comments)    SEIZURE   Nsaids Other (See Comments)    NAPROXEN, IBUPROFEN, SKELAXIN---  CAUSES  PALPITATIONS   Pravastatin Other (See Comments)    Myalgias    Prednisone Other (See Comments)    Caused her glucose to go up to 500 and went to ED   Rosuvastatin Other (See Comments)    Joint stiffness and muscle pain   Roxicodone [Oxycodone] Itching and Other (See Comments)    Hallucinations and itching   Tramadol Other (See Comments)    Contraindicated with Victoza    Codeine Itching   Mango Flavor [Flavoring Agent] Itching    Medications Reviewed Today     Reviewed by [name], RPH-CPP (Pharmacist) on 09/04/21 at 1123  Med List Status: <None>   Medication Order Taking? Sig Documenting Provider Last Dose Status Informant  albuterol (VENTOLIN HFA) 108 (90 Base) MCG/ACT inhaler [id number] No Inhale 2 puffs into the lungs every 6 (six) hours as needed for wheezing or shortness of breath.  Patient not taking: Reported on 09/04/2021   Copland, Gay Filler, MD Not Taking Active Self  Blood Glucose Monitoring Suppl (ONE TOUCH ULTRA 2) w/Device KIT [id number] Yes Use to check glucose up to twice daily. E11.9 dx code Copland, Gay Filler, MD Taking Active Self  cetirizine (ZYRTEC) 10 MG tablet [id number] Yes Take 1 tablet (10 mg total) by mouth daily. Copland, Gay Filler, MD Taking Active Self  clopidogrel (PLAVIX) 75 MG tablet [id number] Yes TAKE ONE TABLET BY MOUTH EVERY MORNING Copland, Gay Filler, MD Taking Active   empagliflozin (JARDIANCE) 10 MG TABS tablet [id number] Yes Take 1 tablet (10 mg total) by mouth daily.  Patient taking differently: Take 5 mg by  mouth daily.   Copland, Gay Filler, MD Taking Active Self  famotidine (PEPCID) 40 MG tablet [id number] Yes Take 1 tablet (40 mg total) by mouth daily. Copland, Gay Filler, MD Taking Active   fluticasone (FLONASE) 50 MCG/ACT nasal spray [id number] Yes Place 2 sprays into both nostrils daily. [provider] Taking Active Self  fluticasone (FLOVENT HFA) 220 MCG/ACT inhaler [id number] Yes INHALE 1 PUFF BY MOUTH INTO LUNGS TWICE DAILY. MAY INCREASE TO TWO PUFFS IF needed  Patient taking differently: Inhale 2 puffs into the lungs 2 (two) times daily as needed (shortness of breath).   Copland, Gay Filler, MD Taking Active Self  furosemide (LASIX) 20 MG tablet [id number] Yes TAKE ONE TABLET BY MOUTH EVERY MORNING Copland, Gay Filler, MD Taking Active Self  gabapentin (NEURONTIN) 300 MG capsule [id number] Yes TAKE TWO CAPSULES BY MOUTH EVERY MORNING and TAKE TWO CAPSULES BY MOUTH EVERYDAY AT BEDTIME Copland, Gay Filler, MD Taking Active   glucose blood (ONETOUCH VERIO) test strip [id number] Yes 1 each by Other route daily. Use as instructed [provider] Taking Active Self  ipratropium (ATROVENT) 0.03 % nasal spray [id number] Yes Place 1 spray into the nose every morning. Copland, Gay Filler, MD Taking Active Self  linagliptin (TRADJENTA) 5 MG TABS tablet [id number]  TAKE 1/2 TABLET BY MOUTH EVERY MORNING Copland, Gay Filler, MD  Active   lisinopril (ZESTRIL) 20 MG tablet [id number] Yes TAKE ONE TABLET BY MOUTH EVERY MORNING Copland, Gay Filler, MD Taking Active Self  metFORMIN (GLUCOPHAGE-XR) 500 MG 24 hr tablet [id number] Yes Take 2 tablets (1,000 mg total) by mouth 2 (two) times daily after a meal. Copland, Gay Filler, MD Taking Active   metoprolol tartrate (LOPRESSOR) 25 MG tablet [id number] Yes TAKE 1/2 TABLET BY MOUTH EVERY MORNING and TAKE 1/2 TABLET BY MOUTH EVERYDAY AT BEDTIME Roma Schanz R, DO Taking Active   Multiple Vitamins-Minerals (ADULT GUMMY PO)  [id number] Yes Take 2 capsules by mouth  daily. [provider] Taking Active Self  OneTouch Delica Lancets 70W MISC [id number] Yes Use to check glucose up to twice daily. E11.9 dx code Copland, Gay Filler, MD Taking Active Self  Roma Schanz test strip [id number] Yes USE TO CHECK BLOOD GLUCOSE UP TO TWICE DAILY Copland, Gay Filler, MD Taking Active   Spacer/Aero-Holding [facility] [id number] Yes Use with Flovent and albuterol inhalers Ann Held, DO Taking Active   VITAMIN D PO [id number] Yes Take 1 capsule by mouth daily. [provider] Taking Active Self            Patient Active Problem List   Diagnosis Date Noted   Trochanteric bursitis, left hip 06/24/2021   Traumatic hematoma of left lower leg 09/26/2018   S/P total knee arthroplasty, left 03/29/2017   Spondylosis without myelopathy or radiculopathy, cervical region 03/29/2017   Bilateral carpal tunnel syndrome 03/29/2017   Acute asthma exacerbation 08/19/2016   Cervicalgia 03/23/2016   Vertigo 04/14/2015   Anxiety state 01/22/2015   HLD (hyperlipidemia) 01/22/2015   Type 2 diabetes mellitus with circulatory disorder ([place]) 01/22/2015   Intracranial vascular stenosis 01/22/2015   Aneurysm ([place])    Headache    Essential hypertension 11/12/2014   Dependent edema 11/12/2014   Peripheral neuropathy 11/12/2014   Depression 11/12/2014   Facial droop    TIA (transient ischemic attack) 11/11/2014   Hyperthyroidism 08/06/2014   Meniere's disease 06/07/2014   Goiter 06/13/2013   Obesity, unspecified 06/13/2013    Immunization History  Administered Date(s) Administered   PPD Test 06/29/2013, 02/18/2015, 04/25/2018   Pneumococcal Conjugate-13 12/06/2016   Pneumococcal Polysaccharide-23 11/21/2009, 02/15/2018   Tdap 09/22/2009    Conditions to be addressed/monitored: CHF, CAD, HTN, HLD, DMII, Depression and asthma; neuropathy; low serum vitamin D  Care Plan : General Pharmacy (Adult)  Updates made by [name], RPH-CPP since  09/04/2021 12:00 AM     Problem: type 2 DM; HTN; h/o stroke; CAD; asthma   Priority: High  Onset Date: 06/26/2020  Note:   Current Barriers:  Possible side effect to metformin therapy Reflux not adequately controlled (improved) Due to have labs rechecked  Pharmacist Clinical Goal(s):  Over the next 180 days, patient will maintain control of type 2 DM and HTN as evidenced by A1c < 7.0 and BP < 140/90  adhere to prescribed medication regimen as evidenced by fill history through collaboration with PharmD and provider.  Assess potential side effects of metformin and take steps to lower side effect risk. Decrease symptom of GERD  Interventions: 1:1 collaboration with Copland, Gay Filler, MD regarding development and update of comprehensive plan of care as evidenced by provider attestation and co-signature Inter-disciplinary care team collaboration (see longitudinal plan of care) Comprehensive medication review performed; medication list updated in electronic medical record  Hypertension / CHF BP Readings from Last 3 Encounters:  06/24/21 120/75  06/11/21 (!) 153/83  06/10/21 138/82   Improving; controlled per home blood pressure readings;  BP goal <140/90 Current regimen:  Lisinopril 582m daily Metoprolol tartrate 230m- take 0.5 tab = 12.82m42mwice daily Furosemide 67m58mth breakfast Home blood pressure - has not checked in the last month Denies edema in legs Interventions: Restart checking blood pressure at home 1 or 2 times per week and record for future appointments Continue current medications for heart / blood pressure  Hyperlipidemia / history of TIA LDL goal is <70 - patient is at goal without pharmacotherapy Current  regimen:  Diet and exercise management for cholesterol Clopidogrel 44m daily   Statin intolerant Statins tried: rosuvastatin 557m3 times per week - caused stiffness and muscle pain which patient felt lead to fall; atorvastatin and pravastatin - myalgias.   Interventions: Continue to limit high fat / cholesterol foods Plan to restart physical therapy once approved by orthopedist - goal is to work up to at least 150 minutes of exercise per week as able.  Continue to take clopidogrel for stroke prevention Due to recheck lipids   Diabetes Controlled; last A1c at goal of < 7.0% Checking BG at home 2 to 3 times per week. Ranges from 100 to 110 mostly but has one after steroid injection of 160 Current regimen:  Tradjenta 62m70m/2 tablet = 2.62mg34mily Jardiance 10mg31m tablet = 62mg d44my  Metformin 500mg E40mtake 2 tablets = 1000mg tw42mdaily Patient reports that loose stools a little better but still usually has loose BM about 1 hour after eating breakfast and evening meals. Suspect related to metformin. Tried lower dose of metformin 500mg twi1m day. Loose stools improved but patient reports hoome blood glucose increased to 160's so she restarted taking 2 tablets = 1000mg twic66mday. Today she reports change to ER has helped a little. Interventions: Given that she has CHF, would like to have her on full 10mg of Ja41mnce or 262mg. Discu34m with patient but she states she experience too frequent urination when she was not Jardiance 10mg daily. 462mn to change to Metformin ER 750mg twice a 72mwhen she starts her new packaging. Patient asked to wait to send in Rx until aroudn 09/17/2021.  Contact office if experience increase in low blood sugar readings (<80) Reviewed home blood glucose readings and reviewed goals  Fasting blood glucose goal (before meals) = 80 to 130 Blood glucose goal after a meal = less than 180   Depression Patient's grandson was murdered / shot in January 2022. She also lost a son about 20 years ago to gun violence.  She states she does belong to a [place] Violence [place].  She reports she has good support from sister, Connie TillmanEmelia Loron grandchildren (has 17 grandchildr22) She has  taken sertraline 262mg daily and14mtiapine 262mg daily in p94mStates she stopped because she did not feel she needed and she was having tardive dyskinesia symptoms Current regimen:  none Interventions: Continue to engage with LCSW   Low Serum Vitamin D: Last serum vitamin D was 42.21 (07/20/2021) previous serum vitamin D was 25.37 (06/02/2020) Current regimen:  Vitamin D3 2000 units daily -  Interventions: Continue current vitamin D supplementation  Asthma:  Rarely uses albuterol inhaler Still uses Flovent as needed.  Has spaces to use if needed.  Current regimen:  Flovent 220mcg inhaler - 22mle 1 puff twice a day (may increase to 2 puff twice a day if needed)  Albuterol Inhaler - inhale 2 puffs every 6 hours as needed.  Interventions:  Discussed difference between maintenance / Flovent inhaler and rescue / albuterol inhaler Encouraged patient to use Flovent every day    Medication management Current pharmacy: [facility] Interventions Comprehensive medication review performed. Utilize [facility] pharmacy for medication synchronization, packaging and delivery Reviewed refill records and patient has fill maintenance medications on time except Flovent  Patient Goals/Self-Care Activities Over the next 90 days, patient will:  take medications as prescribed,  check glucose 2 to 3 times per week, document,  and provide at future appointments,  check blood pressure 2 to 3 times per week, document, and provide at future appointments,  weigh daily, and contact provider if weight gain of 3lbs in 24 hours or 5lb in 1 week, and increase water intake  Follow Up Plan: Telephone follow up appointment with care management team member scheduled for:  4 to 6 weeks.            Medication Assistance: None required.  Patient affirms current coverage meets needs.  Patient's preferred pharmacy is:  [facility] - [place], [place] - [address]  [place] [address] Phone: [phone] Fax: [phone]   Uses pill box? Yes - med packaged by [facility] Pt endorses 99% compliance  Follow Up:  Patient agrees to Care Plan and Follow-up.   Telephone follow up appointment with care management team member scheduled for:  4 to 6 weeks  [name], PharmD Clinical Pharmacist [facility]

## 2021-09-07 DIAGNOSIS — E785 Hyperlipidemia, unspecified: Secondary | ICD-10-CM | POA: Diagnosis not present

## 2021-09-07 DIAGNOSIS — J4521 Mild intermittent asthma with (acute) exacerbation: Secondary | ICD-10-CM

## 2021-09-07 DIAGNOSIS — E118 Type 2 diabetes mellitus with unspecified complications: Secondary | ICD-10-CM | POA: Diagnosis not present

## 2021-09-14 ENCOUNTER — Other Ambulatory Visit: Payer: Self-pay | Admitting: Family Medicine

## 2021-09-14 DIAGNOSIS — I10 Essential (primary) hypertension: Secondary | ICD-10-CM

## 2021-09-18 ENCOUNTER — Other Ambulatory Visit: Payer: Self-pay | Admitting: Pharmacist

## 2021-09-18 MED ORDER — METFORMIN HCL ER 750 MG PO TB24: 750.0000 mg | ORAL_TABLET | Freq: Two times a day (BID) | ORAL | 1 refills | Status: DC

## 2021-09-23 ENCOUNTER — Other Ambulatory Visit: Payer: Self-pay | Admitting: Family Medicine

## 2021-10-16 ENCOUNTER — Ambulatory Visit (INDEPENDENT_AMBULATORY_CARE_PROVIDER_SITE_OTHER): Payer: Medicare Other | Admitting: Pharmacist

## 2021-10-16 ENCOUNTER — Encounter: Payer: Self-pay | Admitting: Pharmacist

## 2021-10-16 DIAGNOSIS — E118 Type 2 diabetes mellitus with unspecified complications: Secondary | ICD-10-CM

## 2021-10-16 DIAGNOSIS — E785 Hyperlipidemia, unspecified: Secondary | ICD-10-CM

## 2021-10-16 NOTE — Chronic Care Management (AMB) (Signed)
Chronic Care Management Pharmacy Note  10/16/2021 Name:  Sherry [name] MRN:  [id number] DOB:  Feb 14, 1950  Summary:  Type 2 DM:  Controlled with last A1c being 6.6 %. Patient was having diffculty maintaining blood glucose in range and tolerating metformin. Changed to Metformin ER 767m twice a day with August packaging. Patient reports she is tolerating this dose better than 10055mtwice a day and also blood glucose is better controlled than when she was taking 50071mwice a day.  Medication Adherence: continues to take medications as prescribed and filling on time. Using Adherence packaging with her pharmacy.   CVD prevention: patient is noted to be statin in tolerant Last LDL was 60 without pharmacotherapy. Statins tried: rosuvastatin 5mg67mtimes per week - caused stiffness and muscle pain which patient felt lead to fall; atorvastatin and pravastatin - myalgias. Health Maintenance: Due to have mammogram. Provided information for rescheduling mammogram  Subjective: Sherry KEOUGHan 70 y11. year old female who is a primary patient of Copland, JessGay Filler.  The CCM team was consulted for assistance with disease management and care coordination needs.    Engaged with patient by telephone for follow up visit in response to provider referral for pharmacy case management and/or care coordination services.   Consent to Services:  The patient was given information about Chronic Care Management services, agreed to services, and gave verbal consent prior to initiation of services.  Please see initial visit note for detailed documentation.   Patient Care Team: Copland, JessGay Filler as PCP - General (Family Medicine) HiltDebara PickettnNadean Corwin as PCP - Cardiology (Cardiology) EckaCherre RobinsH-CPP (Pharmacist)  Recent office visits: 06/10/2021 - Fam Med (Dr CoplLorelei Ponten for left knee and hip pain. History of left TKR. Ordered xray of left hip and knee. Prescribed hydrocodone/PAP 5/325mg15m5 tabs to take up to every 8 hours as neede for pain. Referred to ortho.  06/04/2021 - AWV 06/03/2020 - PCP (Dr CoplaLorelei Pontre-Op Evaluation for right reverse total shoulder repair; Labs checked; no medication changes.   Recent consult visits: 06/24/2021 - Ortho surgery (Dr YatesLorin [facility] left hip bursitis. Reports good relief after hip injection 06/11/2021. Recommended use cane for ambulation and discuss fall prevention. If left hip pain continues to improve, will order Physical therapy in 1 month for lower extremeity strengthening, balance and fall prevention.  06/11/2021 - OrtManson PasseynRicard Dillon) [place] for bursitis of left hip. Left hip injection. lidocaine bupivacaine and methylPREDNISolone acetate. F/U 2 weeks  Hospital visits: None in last 6 months  Objective:  Lab Results  Component Value Date   CREATININE 0.78 07/20/2021   CREATININE 0.50 01/06/2021   CREATININE 0.71 07/24/2020    Lab Results  Component Value Date   HGBA1C 6.6 (H) 07/20/2021   Last diabetic Eye exam: No results found for: "HMDIABEYEEXA"  Last diabetic Foot exam: No results found for: "HMDIABFOOTEX"      Component Value Date/Time   CHOL 143 07/20/2021 0938   TRIG 71.0 07/20/2021 0938   HDL 68.60 07/20/2021 0938   CHOLHDL 2 07/20/2021 0938   VLDL 14.2 07/20/2021 0938   LDLCALC 60 07/20/2021 0938       Latest Ref Rng & Units 07/20/2021    9:38 AM 06/02/2020    9:33 AM 09/04/2019    2:43 PM  Hepatic Function  Total Protein 6.0 - 8.3 g/dL 6.4  6.8  6.6   Albumin 3.5 - 5.2 g/dL 4.2  4.0  3.6   AST 0 - 37 U/L 11  15  19    ALT 0 - 35 U/L 10  12  14    Alk Phosphatase 39 - 117 U/L 69  74  56   Total Bilirubin 0.2 - 1.2 mg/dL 0.5  0.7  0.5     Lab Results  Component Value Date/Time   TSH 1.06 06/02/2020 09:33 AM   TSH 1.320 05/16/2019 02:14 PM   FREET4 0.63 12/30/2015 02:38 PM   FREET4 0.74 01/13/2015 09:54 AM       Latest Ref Rng & Units 01/06/2021   12:17 PM 01/06/2021    9:45 AM 07/24/2020    1:38 PM   CBC  WBC 4.0 - 10.5 K/uL  6.3  6.7   Hemoglobin 12.0 - 15.0 g/dL 13.9  14.0  13.4   Hematocrit 36.0 - 46.0 % 41.0  46.2  43.3   Platelets 150 - 400 K/uL  302  280     Lab Results  Component Value Date/Time   VD25OH 42.21 07/20/2021 09:38 AM   VD25OH 25.37 (L) 06/02/2020 09:33 AM    Clinical ASCVD: Yes  The 10-year ASCVD risk score (Arnett DK, et al., 2019) is: 18.2%   Values used to calculate the score:     Age: 9 years     Sex: Female     Is Non-Hispanic African American: Yes     Diabetic: Yes     Tobacco smoker: No     Systolic Blood Pressure: 179 mmHg     Is BP treated: Yes     HDL Cholesterol: 68.6 mg/dL     Total Cholesterol: 143 mg/dL     Social History   Tobacco Use  Smoking Status Never   Passive exposure: Never  Smokeless Tobacco Never   BP Readings from Last 3 Encounters:  06/24/21 120/75  06/11/21 (!) 153/83  06/10/21 138/82   Pulse Readings from Last 3 Encounters:  06/24/21 70  06/11/21 72  06/10/21 63   Wt Readings from Last 3 Encounters:  06/24/21 191 lb (86.6 kg)  06/11/21 193 lb 9.6 oz (87.8 kg)  06/10/21 193 lb 9.6 oz (87.8 kg)    Assessment: Review of patient past medical history, allergies, medications, health status, including review of consultants reports, laboratory and other test data, was performed as part of comprehensive evaluation and provision of chronic care management services.   SDOH:  (Social Determinants of Health) assessments and interventions performed:  SDOH Interventions    Flowsheet Row Chronic Care Management from 09/04/2021 in [facility] [place] Management from 06/26/2021 in [place] [facility] [place] Management from 12/03/2020 in [place] [facility] [place] Management from 11/28/2020 in [place] [facility] [place] Management from 06/26/2020 in [facility] [place] from 05/29/2020 in [place] [facility] [place] Interventions        Food Insecurity Interventions -- -- Intervention Not Indicated -- -- --  Housing Interventions -- -- Intervention Not Indicated -- -- --  Transportation Interventions -- -- Intervention Not Indicated -- -- --  Depression Interventions/Treatment  -- -- -- -- --  [Patient considering restarting sertraline, ] --  Financial Strain Interventions -- Intervention Not Indicated Intervention Not Indicated -- Intervention Not Indicated --  Physical Activity Interventions Other (Comments)  [Discussed restarting walking  when weather is cooler] Other (Comments)  [not current exercise due to hip pain. Seeing ortho for hip pain.] Intervention Not Indicated, Patient Refused -- Other (Comments)  [Pt is increasing daily activities.] Intervention Not Indicated  [pt has set a goal to increase activiy]  Stress Interventions -- -- [employer], Provide Counseling -- Intervention Not Indicated --  Social Connections Interventions -- -- Intervention Not Indicated Intervention Not Indicated -- --         CCM Care Plan  Allergies  Allergen Reactions   Influenza Vaccines Anaphylaxis   Atorvastatin Other (See Comments)    Joint pain / myalgias   Demerol Other (See Comments)    SEIZURE   Nsaids Other (See Comments)    NAPROXEN, IBUPROFEN, SKELAXIN---  CAUSES PALPITATIONS   Pravastatin Other (See Comments)    Myalgias    Prednisone Other (See Comments)    Caused her glucose to go up to 500 and went to ED   Rosuvastatin Other (See Comments)    Joint stiffness and muscle pain   Roxicodone [Oxycodone] Itching and Other (See Comments)    Hallucinations and itching   Tramadol Other (See Comments)    Contraindicated with Victoza    Codeine Itching   Mango Flavor [Flavoring Agent] Itching    Medications Reviewed Today      Reviewed by [name], RPH-CPP (Pharmacist) on 10/16/21 at 1217  Med List Status: <None>   Medication Order Taking? Sig Documenting Provider Last Dose Status Informant  albuterol (VENTOLIN HFA) 108 (90 Base) MCG/ACT inhaler [id number] No Inhale 2 puffs into the lungs every 6 (six) hours as needed for wheezing or shortness of breath.  Patient not taking: Reported on 09/04/2021   Copland, Gay Filler, MD Not Taking Active Self  Blood Glucose Monitoring Suppl (ONE TOUCH ULTRA 2) w/Device KIT [id number] Yes Use to check glucose up to twice daily. E11.9 dx code Copland, Gay Filler, MD Taking Active Self  cetirizine (ZYRTEC) 10 MG tablet [id number] Yes Take 1 tablet (10 mg total) by mouth daily. Copland, Gay Filler, MD Taking Active Self  clopidogrel (PLAVIX) 75 MG tablet [id number] Yes TAKE ONE TABLET BY MOUTH EVERY MORNING Copland, Gay Filler, MD Taking Active   empagliflozin (JARDIANCE) 10 MG TABS tablet [id number] Yes Take 1 tablet (10 mg total) by mouth daily.  Patient taking differently: Take 5 mg by mouth daily.   Copland, Gay Filler, MD Taking Active Self  famotidine (PEPCID) 40 MG tablet [id number] Yes TAKE ONE TABLET BY MOUTH EVERY MORNING Copland, Gay Filler, MD Taking Active   fluticasone (FLONASE) 50 MCG/ACT nasal spray [id number] Yes Place 2 sprays into both nostrils daily. [provider] Taking Active Self  fluticasone (FLOVENT HFA) 220 MCG/ACT inhaler [id number] Yes INHALE 1 PUFF BY MOUTH INTO LUNGS TWICE DAILY. MAY INCREASE TO TWO PUFFS IF needed  Patient taking differently: Inhale 2 puffs into the lungs 2 (two) times daily as needed (shortness of breath).   Copland, Gay Filler, MD Taking Active Self  furosemide (LASIX) 20 MG tablet [id number] Yes TAKE ONE TABLET BY MOUTH EVERY MORNING Copland, Gay Filler, MD Taking Active   gabapentin (NEURONTIN) 300 MG capsule [id number] Yes TAKE TWO CAPSULES BY MOUTH EVERY MORNING and TAKE TWO CAPSULES BY MOUTH EVERYDAY AT BEDTIME Copland, Gay Filler, MD  Taking Active   glucose blood (ONETOUCH VERIO) test strip [id number] Yes 1 each by Other route daily. Use as instructed [provider] Taking Active Self  ipratropium (ATROVENT) 0.03 % nasal spray [id number]  Yes Place 1 spray into the nose every morning. Copland, Gay Filler, MD Taking Active Self  linagliptin (TRADJENTA) 5 MG TABS tablet [id number] Yes TAKE 1/2 TABLET BY MOUTH EVERY MORNING Copland, Gay Filler, MD Taking Active   lisinopril (ZESTRIL) 20 MG tablet [id number] Yes TAKE ONE TABLET BY MOUTH EVERY MORNING Copland, Gay Filler, MD Taking Active   metFORMIN (GLUCOPHAGE-XR) 750 MG 24 hr tablet [id number] Yes Take 1 tablet (750 mg total) by mouth 2 (two) times daily with a meal. Copland, Gay Filler, MD Taking Active   metoprolol tartrate (LOPRESSOR) 25 MG tablet [id number] Yes TAKE 1/2 TABLET BY MOUTH EVERY MORNING and TAKE 1/2 TABLET BY MOUTH EVERYDAY AT BEDTIME Copland, Gay Filler, MD Taking Active   Multiple Vitamins-Minerals (ADULT GUMMY PO) [id number] Yes Take 2 capsules by mouth daily. [provider] Taking Active Self  OneTouch Delica Lancets 27O MISC [id number] Yes Use to check glucose up to twice daily. E11.9 dx code Copland, Gay Filler, MD Taking Active Self  Roma Schanz test strip [id number] Yes USE TO CHECK BLOOD GLUCOSE UP TO TWICE DAILY Copland, Gay Filler, MD Taking Active   Spacer/Aero-Holding [facility] [id number] Yes Use with Flovent and albuterol inhalers Ann Held, DO Taking Active   VITAMIN D PO [id number] Yes Take 1 capsule by mouth daily. [provider] Taking Active Self            Patient Active Problem List   Diagnosis Date Noted   Trochanteric bursitis, left hip 06/24/2021   Traumatic hematoma of left lower leg 09/26/2018   S/P total knee arthroplasty, left 03/29/2017   Spondylosis without myelopathy or radiculopathy, cervical region 03/29/2017   Bilateral carpal tunnel syndrome 03/29/2017   Acute asthma exacerbation 08/19/2016    Cervicalgia 03/23/2016   Vertigo 04/14/2015   Anxiety state 01/22/2015   HLD (hyperlipidemia) 01/22/2015   Type 2 diabetes mellitus with circulatory disorder ([place]) 01/22/2015   Intracranial vascular stenosis 01/22/2015   Aneurysm ([place])    Headache    Essential hypertension 11/12/2014   Dependent edema 11/12/2014   Peripheral neuropathy 11/12/2014   Depression 11/12/2014   Facial droop    TIA (transient ischemic attack) 11/11/2014   Hyperthyroidism 08/06/2014   Meniere's disease 06/07/2014   Goiter 06/13/2013   Obesity, unspecified 06/13/2013    Immunization History  Administered Date(s) Administered   PPD Test 06/29/2013, 02/18/2015, 04/25/2018   Pneumococcal Conjugate-13 12/06/2016   Pneumococcal Polysaccharide-23 11/21/2009, 02/15/2018   Tdap 09/22/2009    Conditions to be addressed/monitored: CHF, CAD, HTN, HLD, DMII, Depression and asthma; neuropathy; low serum vitamin D  Care Plan : General Pharmacy (Adult)  Updates made by [name], RPH-CPP since 10/16/2021 12:00 AM     Problem: type 2 DM; HTN; h/o stroke; CAD; asthma   Priority: High  Onset Date: 06/26/2020  Note:   Current Barriers:  Possible side effect to metformin therapy (improved with change to ER and dose adjustment) Reflux not adequately controlled (improved) Complex medication regimen Lack of care coordination between multiple providers Does not keep appointments for follow up / health maintenance.    Pharmacist Clinical Goal(s):  Over the next 180 days, patient will maintain control of type 2 DM and HTN as evidenced by A1c < 7.0 and BP < 140/90  adhere to prescribed medication regimen as evidenced by fill history through collaboration with PharmD and provider.  Assess potential side effects of metformin and take steps to lower side effect risk. Decrease symptom of GERD  Interventions: 1:1  collaboration with Copland, Gay Filler, MD regarding development and update of comprehensive plan of care  as evidenced by provider attestation and co-signature Inter-disciplinary care team collaboration (see longitudinal plan of care) Comprehensive medication review performed; medication list updated in electronic medical record  Hypertension / CHF BP Readings from Last 3 Encounters:  06/24/21 120/75  06/11/21 (!) 153/83  06/10/21 138/82   Improving; controlled per home blood pressure readings;  BP goal <140/90 Current regimen:  Lisinopril 62m daily Metoprolol tartrate 254m- take 0.5 tab = 12.27m36mwice daily Furosemide 40m38mth breakfast Home blood pressure - has not checked in the last month Denies edema in legs Interventions: Restart checking blood pressure at home 1 or 2 times per week and record for future appointments Continue current medications for heart / blood pressure  Hyperlipidemia / history of TIA LDL goal is <70 - patient is at goal without pharmacotherapy. Noted to be statin intolerant Current regimen:  Diet and exercise management for cholesterol Clopidogrel 727mg59mly   Statin intolerant Statins tried: rosuvastatin 27mg 344mmes per week - caused stiffness and muscle pain which patient felt lead to fall; atorvastatin and pravastatin - myalgias.  Interventions: Continue to limit high fat / cholesterol foods Plan to restart physical therapy once approved by orthopedist - goal is to work up to at least 150 minutes of exercise per week as able.  Continue to take clopidogrel for stroke prevention Due to recheck lipids   Diabetes Controlled; last A1c at goal of < 7.0% Checking BG at home 2 to 3 times per week. Ranges from 100 to 110 mostly but has one after steroid injection of 160 Current regimen:  Tradjenta 27mg 1/63mablet = 2.27mg dai76mJardiance 10mg 1/266mlet = 27mg daily84metformin 750mg ER - 10m 1 tablet twice a day with food.  Patient reports that loose stools better since changed to metformin ER 750mg twice 61my. In past tried lower dose of metformin 500mg  twice 57my. Loose stools improved but patient reports home blood glucose increased to 160's. Had lots of diarrhea when she tried metformin ER 500mg 2 tablet26m1000mg twice a d58m Recent blood glucose 100 to 140. Interventions: Given that she has CHF, would like to have her on full 10mg of Jardian527mr 227mg. Discussed 327m patient but she states she experience too frequent urination when she was not Jardiance 10mg daily.  Cont70m Metformin ER 750mg twice a day  3mact office if experience increase in low blood sugar readings (<80) Reviewed home blood glucose readings and reviewed goals  Fasting blood glucose goal (before meals) = 80 to 130 Blood glucose goal after a meal = less than 180   Depression Patient's grandson was murdered / shot in January 2022. She also lost a son about 20 years ago to gun violence.  She states she does belong to a [place] [employer] [place].  She reports she has good support from sister, Connie Tillman and Emelia Lorondchildren (has 17 grandchildren) S22 has taken sertraline 227mg daily and quet87mne 227mg daily in past S56ms she stopped because she did not feel she needed and she was having tardive dyskinesia symptoms Current regimen:  none Interventions: Continue to engage with LCSW   Low Serum Vitamin D: Last serum vitamin D was 42.21 (07/20/2021) previous serum vitamin D was 25.37 (06/02/2020) Current regimen:  Vitamin D3 2000 units daily -  Interventions: Continue current vitamin D supplementation  Asthma:  Rarely uses albuterol inhaler Still uses Flovent as needed.  Has spaces to use if needed.  Current regimen:  Flovent 251mg inhaler - inhale 1 puff twice a day (may increase to 2 puff twice a day if needed)  Albuterol Inhaler - inhale 2 puffs every 6 hours as needed.  Interventions:  Discussed difference between maintenance / Flovent inhaler and rescue / albuterol inhaler Encouraged patient to use Flovent every  day    Medication management Current pharmacy: [facility] Interventions Comprehensive medication review performed. Utilize [facility] pharmacy for medication synchronization, packaging and delivery Reviewed refill records and patient has fill maintenance medications on time except Flovent  Patient Goals/Self-Care Activities Over the next 90 days, patient will:  take medications as prescribed,  check glucose 2 to 3 times per week, document, and provide at future appointments,  check blood pressure 2 to 3 times per week, document, and provide at future appointments,  weigh daily, and contact provider if weight gain of 3lbs in 24 hours or 5lb in 1 week, and increase water intake [phone] Follow Up Plan: Telephone follow up appointment with care management team member scheduled for:  2 to 3 months           Medication Assistance: None required.  Patient affirms current coverage meets needs.  Patient's preferred pharmacy is:  [facility] [place] [address] [address] [address] [id number]: [phone]: [phone]  Uses pill box? Yes - med packaged by [facility] Pt endorses 99% compliance  Follow Up:  Patient agrees to Care Plan and Follow-up.   Telephone follow up appointment with care management team member scheduled for:  2 ot 3 months  TCherre Robins PharmD Clinical Pharmacist [facility]

## 2021-10-16 NOTE — Patient Instructions (Addendum)
Ms. Notarianni It was a pleasure speaking with you today.  Below is a summary of your health goals and summary of our recent visit. You can also view your updated Chronic Care Management Care plan through your MyChart account.    Patient Goals/Self-Care Activities Over the next 90 days, patient will:  take medications as prescribed,  check glucose 2 to 3 times per week, document, and provide at future appointments,  check blood pressure 2 to 3 times per week, document, and provide at future appointments,  weigh daily, and contact provider if weight gain of 3lbs in 24 hours or 5lb in 1 week, and increase water intake Reschedule mammogram - [facility] [place] [phone]  Follow Up Plan: Telephone follow up appointment with care management team member scheduled for:  2 to 3 months     As always if you have any questions or concerns especially regarding medications, please feel free to contact me either at the phone number below or with a MyChart message.   Keep up the good work!  [name], PharmD Clinical Pharmacist [place] [phone] (direct line)  [phone] (main office number)   Patient verbalizes understanding of instructions and care plan provided today and agrees to view in [place]. Active MyChart status and patient understanding of how to access instructions and care plan via MyChart confirmed with patient.

## 2021-11-06 DIAGNOSIS — H40013 Open angle with borderline findings, low risk, bilateral: Secondary | ICD-10-CM | POA: Diagnosis not present

## 2021-11-06 LAB — HM DIABETES EYE EXAM

## 2021-11-07 DIAGNOSIS — Z794 Long term (current) use of insulin: Secondary | ICD-10-CM | POA: Diagnosis not present

## 2021-11-07 DIAGNOSIS — E1159 Type 2 diabetes mellitus with other circulatory complications: Secondary | ICD-10-CM | POA: Diagnosis not present

## 2021-11-07 DIAGNOSIS — E785 Hyperlipidemia, unspecified: Secondary | ICD-10-CM | POA: Diagnosis not present

## 2021-11-07 DIAGNOSIS — F32A Depression, unspecified: Secondary | ICD-10-CM | POA: Diagnosis not present

## 2021-11-07 DIAGNOSIS — I110 Hypertensive heart disease with heart failure: Secondary | ICD-10-CM | POA: Diagnosis not present

## 2021-11-07 DIAGNOSIS — I509 Heart failure, unspecified: Secondary | ICD-10-CM

## 2021-11-07 DIAGNOSIS — J45909 Unspecified asthma, uncomplicated: Secondary | ICD-10-CM

## 2021-11-11 ENCOUNTER — Encounter: Payer: Self-pay | Admitting: Family Medicine

## 2021-11-18 ENCOUNTER — Other Ambulatory Visit: Payer: Self-pay | Admitting: Family Medicine

## 2021-11-18 DIAGNOSIS — E118 Type 2 diabetes mellitus with unspecified complications: Secondary | ICD-10-CM

## 2021-11-25 ENCOUNTER — Other Ambulatory Visit (HOSPITAL_BASED_OUTPATIENT_CLINIC_OR_DEPARTMENT_OTHER): Payer: Self-pay | Admitting: Family Medicine

## 2021-11-25 DIAGNOSIS — Z1231 Encounter for screening mammogram for malignant neoplasm of breast: Secondary | ICD-10-CM

## 2021-11-30 ENCOUNTER — Encounter (HOSPITAL_BASED_OUTPATIENT_CLINIC_OR_DEPARTMENT_OTHER): Payer: Self-pay

## 2021-11-30 ENCOUNTER — Ambulatory Visit (HOSPITAL_BASED_OUTPATIENT_CLINIC_OR_DEPARTMENT_OTHER)
Admission: RE | Admit: 2021-11-30 | Discharge: 2021-11-30 | Disposition: A | Discharge status: Home / Self Care | Payer: Medicare Other | Source: Ambulatory Visit | Attending: Family Medicine | Admitting: Family Medicine

## 2021-11-30 DIAGNOSIS — Z1231 Encounter for screening mammogram for malignant neoplasm of breast: Secondary | ICD-10-CM | POA: Diagnosis not present

## 2021-12-21 ENCOUNTER — Ambulatory Visit (INDEPENDENT_AMBULATORY_CARE_PROVIDER_SITE_OTHER): Payer: Medicare Other | Admitting: Pharmacist

## 2021-12-21 VITALS — BP 122/62 | HR 64 | Ht 61.0 in | Wt 194.6 lb

## 2021-12-21 DIAGNOSIS — E118 Type 2 diabetes mellitus with unspecified complications: Secondary | ICD-10-CM

## 2021-12-21 DIAGNOSIS — T466X5A Adverse effect of antihyperlipidemic and antiarteriosclerotic drugs, initial encounter: Secondary | ICD-10-CM | POA: Diagnosis not present

## 2021-12-21 DIAGNOSIS — E785 Hyperlipidemia, unspecified: Secondary | ICD-10-CM | POA: Diagnosis not present

## 2021-12-21 DIAGNOSIS — M791 Myalgia, unspecified site: Secondary | ICD-10-CM | POA: Diagnosis not present

## 2021-12-21 DIAGNOSIS — I10 Essential (primary) hypertension: Secondary | ICD-10-CM | POA: Diagnosis not present

## 2021-12-21 MED ORDER — METOPROLOL TARTRATE 25 MG PO TABS: 12.5000 mg | ORAL_TABLET | Freq: Two times a day (BID) | ORAL | 0 refills | Status: DC

## 2021-12-21 NOTE — Progress Notes (Signed)
Pharmacy Note  12/21/2021 Name: Sherry [name] MRN: [id number] DOB: 1950-08-01  Subjective: Sherry [name] is a 71 y.o. year old female who is a primary care patient of Copland, Sherry Found, MD. Clinical Pharmacist Practitioner referral was placed to assist with medication management.    Engaged with patient face to face for follow up visit today.  Type 2 DM / obesity: Patient is taking Jardiance 10mg  0.5 tablet daily, Tradjenta 5mg  0.5 tablet daily and metformin ER 750mg  twice a day. She has had difficulty tolerating higher doses of metformin in past. She is checking blood glucose at home - highest blood glucose recently has been 144. Lowest has been 100.  She is concerned about increase in weight. BMI = 36.77 Patient is not taking a statin. Has history of myalgias to statin therapy - has tried atorvastatin, pravastatin, rosuvastatin and alternate day rosuvastatin 5mg  MWF. Experienced myalgias with all 3 statins. Last LDL was 60 without pharmacotherapy.   Hypertension: blood pressure at goal today. Reports that she sometimes misses evening metoprolol dose.  History of TIAs: patient is taking aspirin daily She cannot remember when she last saw neurology. She is also concerned about changes in her memory. She has forgotten her medications a few times and sometimes has difficulty remembering words. Last visit with neuro was 05/2020.  Medication Management: Reviewed medications and refill history. Patient is using adherence packaging but admits that she occasionally forgets evening doses. She keep meds in her bedroom.   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 She saw Dr Ardelle Anton at [facility] in the past but he no longer comes to [facility].   SDOH (Social Determinants of Health) assessments and interventions performed:  SDOH Interventions    Flowsheet Row Chronic Care Management from 09/04/2021 in [facility] at [facility]  Chronic Care Management from 06/26/2021 in [facility] at [facility] Chronic Care Management from 12/03/2020 in [facility] at [facility] Chronic Care Management from 11[facility]28/2020 in [facility] at [facility] Chronic Care Management from 06/26/2020 in [facility] at [facility] Clinical Support from 05/29/2020 in [facility] at [facility]  SDOH Interventions        Food Insecurity Interventions -- -- Intervention Not Indicated -- -- --  Housing Interventions -- -- Intervention Not Indicated -- -- --  Transportation Interventions -- -- Intervention Not Indicated -- -- --  Depression Interventions/Treatment  -- -- -- -- --  [Patient considering restarting sertraline, ] --  Financial Strain Interventions -- Intervention Not Indicated Intervention Not Indicated -- Intervention Not Indicated --  Physical Activity Interventions Other (Comments)  [Discussed restarting walking when weather is cooler] Other (Comments)  [not current exercise due to hip pain. Seeing ortho for hip pain.] Intervention Not Indicated, Patient Refused -- Other (Comments)  [Pt is increasing daily activities.] Intervention Not Indicated  [pt has set a goal to increase activiy]  Stress Interventions -- -- [employer], Provide Counseling -- Intervention Not Indicated --  Social Connections Interventions -- -- Intervention Not Indicated Intervention Not Indicated -- --        Objective: Review of patient status, including review of consultants reports, laboratory and other test data, was performed as part of comprehensive evaluation and provision of chronic care management services.   Lab Results  Component Value Date   CREATININE 0.78 07/20/2021  CREATININE 0.50 01/06/2021   CREATININE 0.71 07/24/2020    Lab Results  Component Value Date   HGBA1C 6.6 (H) 07/20/2021        Component Value Date/Time   CHOL 143 07/20/2021 0938   TRIG 71.0 07/20/2021 0938   HDL 68.60 07/20/2021 0938   CHOLHDL 2 07/20/2021 0938   VLDL 14.2 07/20/2021 0938   LDLCALC 60 07/20/2021 0938     Clinical ASCVD: Yes  The 10-year ASCVD risk score (Arnett DK, et al., 2019) is: 18.7%   Values used to calculate the score:     Age: 85 years     Sex: Female     Is Non-Hispanic African American: Yes     Diabetic: Yes     Tobacco smoker: No     Systolic Blood Pressure: 122 mmHg     Is BP treated: Yes     HDL Cholesterol: 68.6 mg/dL     Total Cholesterol: 143 mg/dL    BP Readings from Last 3 Encounters:  12/21/21 122/62  06/24/21 120/75  06/11/21 (!) 153/83     Allergies  Allergen Reactions   Influenza Vaccines Anaphylaxis   Atorvastatin Other (See Comments)    Joint pain / myalgias   Demerol Other (See Comments)    SEIZURE   Nsaids Other (See Comments)    NAPROXEN, IBUPROFEN, SKELAXIN---  CAUSES PALPITATIONS   Pravastatin Other (See Comments)    Myalgias    Prednisone Other (See Comments)    Caused her glucose to go up to 500 and went to ED   Rosuvastatin Other (See Comments)    Joint stiffness and muscle pain   Roxicodone [Oxycodone] Itching and Other (See Comments)    Hallucinations and itching   Tramadol Other (See Comments)    Contraindicated with Victoza    Covid-19 (Mrna) Vaccine     Patient had anaphylactic reaction to flu vaccine   Codeine Itching   Mango Flavor [Flavoring Agent] Itching    Medications Reviewed Today     Reviewed by Henrene Pastor, RPH-CPP (Pharmacist) on 12/21/21 at 1033  Med List Status: <None>   Medication Order Taking? Sig Documenting Provider Last Dose Status Informant  albuterol (VENTOLIN HFA) 108 (90 Base) MCG/ACT inhaler [id number] No Inhale 2 puffs into the lungs every 6 (six) hours as needed for wheezing or shortness of breath.  Patient not taking: Reported on 12/21/2021   Copland, Sherry Found, MD Not Taking Active Self   Blood Glucose Monitoring Suppl (ONE TOUCH ULTRA 2) w/Device KIT [id number] Yes Use to check glucose up to twice daily. E11.9 dx code Copland, Sherry Found, MD Taking Active Self  cetirizine (ZYRTEC) 10 MG tablet [id number] Yes Take 1 tablet (10 mg total) by mouth daily. Copland, Sherry Found, MD Taking Active Self  clopidogrel (PLAVIX) 75 MG tablet [id number] Yes TAKE ONE TABLET BY MOUTH EVERY MORNING Copland, Sherry Found, MD Taking Active   empagliflozin (JARDIANCE) 10 MG TABS tablet [id number] Yes Take 1 tablet (10 mg total) by mouth every morning. Copland, Sherry Found, MD Taking Active   famotidine (PEPCID) 40 MG tablet [id number] Yes TAKE ONE TABLET BY MOUTH EVERY MORNING Copland, Sherry Found, MD Taking Active   fluticasone (FLONASE) 50 MCG/ACT nasal spray [id number] Yes Place 2 sprays into both nostrils daily. [provider] Taking Active Self  fluticasone (FLOVENT HFA) 220 MCG/ACT inhaler [id number] Yes INHALE 1 PUFF BY MOUTH INTO LUNGS TWICE DAILY. MAY INCREASE TO TWO PUFFS IF needed  Patient taking differently: Inhale 2 puffs into  the lungs 2 (two) times daily as needed (shortness of breath).   Copland, Sherry Found, MD Taking Active Self  furosemide (LASIX) 20 MG tablet [id number] Yes TAKE ONE TABLET BY MOUTH EVERY MORNING Copland, Sherry Found, MD Taking Active   gabapentin (NEURONTIN) 300 MG capsule [id number] Yes TAKE TWO CAPSULES BY MOUTH EVERY MORNING and TAKE TWO CAPSULES BY MOUTH EVERYDAY AT BEDTIME Copland, Sherry Found, MD Taking Active   glucose blood (ONETOUCH VERIO) test strip [id number] Yes 1 each by Other route daily. Use as instructed [provider] Taking Active Self  ipratropium (ATROVENT) 0.03 % nasal spray [id number] Yes Place 1 spray into the nose every morning. Copland, Sherry Found, MD Taking Active Self  linagliptin (TRADJENTA) 5 MG TABS tablet [id number] Yes TAKE 1/2 TABLET BY MOUTH EVERY MORNING Copland, Sherry Found, MD Taking Active   lisinopril (ZESTRIL) 20 MG tablet [id number] Yes  TAKE ONE TABLET BY MOUTH EVERY MORNING Copland, Sherry Found, MD Taking Active   metFORMIN (GLUCOPHAGE-XR) 750 MG 24 hr tablet [id number] Yes Take 1 tablet (750 mg total) by mouth 2 (two) times daily with a meal. Copland, Sherry Found, MD Taking Active   metoprolol tartrate (LOPRESSOR) 25 MG tablet [id number] Yes TAKE 1/2 TABLET BY MOUTH EVERY MORNING and TAKE 1/2 TABLET BY MOUTH EVERYDAY AT BEDTIME Copland, Sherry Found, MD Taking Active   Multiple Vitamins-Minerals (ADULT GUMMY PO) [id number] Yes Take 2 capsules by mouth daily. [provider] Taking Active Self  OneTouch Delica Lancets 33G MISC [id number] Yes Use to check glucose up to twice daily. E11.9 dx code Copland, Sherry Found, MD Taking Active Self  Lum Babe test strip [id number] Yes USE TO CHECK BLOOD GLUCOSE UP TO TWICE DAILY Copland, Sherry Found, MD Taking Active   Spacer/Aero-Holding [facility] [id number] Yes Use with Flovent and albuterol inhalers Donato Schultz, DO Taking Active   VITAMIN D PO [id number] Yes Take 1 capsule by mouth daily. [provider] Taking Active Self            Patient Active Problem List   Diagnosis Date Noted   Trochanteric bursitis, left hip 06/24/2021   Traumatic hematoma of left lower leg 09/26/2018   S/P total knee arthroplasty, left 03/29/2017   Spondylosis without myelopathy or radiculopathy, cervical region 03/29/2017   Bilateral carpal tunnel syndrome 03/29/2017   Acute asthma exacerbation 08/19/2016   Cervicalgia 03/23/2016   Vertigo 04/14/2015   Anxiety state 01/22/2015   HLD (hyperlipidemia) 01/22/2015   Type 2 diabetes mellitus with circulatory disorder (HCC) 01/22/2015   Intracranial vascular stenosis 01/22/2015   Aneurysm (HCC)    Headache    Essential hypertension 11/12/2014   Dependent edema 11/12/2014   Peripheral neuropathy 11/12/2014   Depression 11/12/2014   Facial droop    TIA (transient ischemic attack) 11/11/2014   Hyperthyroidism 08/06/2014    Meniere's disease 06/07/2014   Goiter 06/13/2013   Obesity, unspecified 06/13/2013     Medication Assistance:  None required.  Patient affirms current coverage meets needs.   Assessment / Plan: Type 2 DM / obesity: Continue Jardiance 10mg  0.5 tablet daily, Tradjenta 5mg  0.5 tablet daily and metformin ER 750mg  twice a day.  Discussed that GLP1 or Mounjaro might be a good alternative to Tradjenta that would help with blood glucose and weight loss. Patient is not sure she wants to try injectable. Provided information about GLPs and Mounjaro for her to review. She will contact me if she decides she would like to change.  Continue to check  blood glucose daily.  Reviewed blood glucose goals  Hypertension: blood pressure at goal today.  Continue current medications for blood pressure  History of TIAs with intolerance to statins in past:  Continue daily aspirin  Assisted patient in making appointment with neurology for follow up. They will need additional referral if patient needs memory assessment as Dr Pearlean Brownie does not see patient's for memory conditions.  Blood pressure goal < 130/80  Medication Management:  Discussed ways to improve adherence. Patient will place adherence packaging in place that she will remember to take daily. Also requested that [facility] move metformin and metoprolol to package to take with morning and evening meals. Gabapentin will be in evening package by itself so she can place on her bedside tablet to take prior to sleep.   Discussed vaccinations recommended - patient declines all vaccines due to past reaction (anaphylaxis) to flu vaccine.   Provided number for [facility] and [place]. Patient to call for appointment.   Made appointment for PCP (12/28/2021) for memory assessment and evaluate elbow pain.  Follow Up:  Telephone follow up appointment with care management team member scheduled for:  2 to 3 months   Henrene Pastor,  PharmD Clinical Pharmacist [facility]  - [facility] [phone]

## 2021-12-21 NOTE — Patient Instructions (Signed)
We will check on getting a referral to Dr Jacqualyn Posey (podiatrist) if a referral needed.   If you decide you would like to try Trulicity, Mounjaro or Ozempic for diabetes and weight let me know (would replace Tradjenta)   Try to move packages to a place that you will remember ti take them with breakfast, dinner and bedtime.  I will check with [facility] about make a evening package for your evening gabapentin dose.

## 2021-12-22 ENCOUNTER — Telehealth: Payer: Self-pay | Admitting: Family Medicine

## 2021-12-22 ENCOUNTER — Telehealth: Payer: Medicare Other

## 2021-12-22 ENCOUNTER — Encounter: Payer: Self-pay | Admitting: Pharmacist

## 2021-12-22 NOTE — Telephone Encounter (Signed)
[facility] with [facility] called to get clarification on patient's metformin and metoprolol. Please call them back at [phone] to discuss.

## 2021-12-23 ENCOUNTER — Telehealth: Payer: Self-pay | Admitting: Pharmacist

## 2021-12-23 NOTE — Telephone Encounter (Signed)
[facility] called to get clarification on packaging for pt's medications. Please Advise.

## 2021-12-23 NOTE — Progress Notes (Deleted)
[facility]  PATIENT: Sherry [name] DOB: 1950-12-24   REASON FOR VISIT: Surgical clearance with history of TIA HISTORY FROM: Patient   No chief complaint on file.     HISTORY OF PRESENT ILLNESS:   [name] is a 71 y.o. female previously seen 12/10/2019 for TIA follow-up and surgical clearance.  She was advised to follow-up as needed from TIA standpoint.  She requested today's visit ***.    history of HTN, DM, HLD, MVP, TIA 11/2014 and 04/2015, intracranial stenosis and right cavernous ICA aneurysm.  Previously seen in office by Dr. Erlinda Hong routine stroke follow-up with prior visit 11/2017.  She returns today to request surgical clearance for right shoulder replacement by Dr. Griffin Basil at [facility] orthopedics.  She has been stable from a stroke standpoint without new or reoccurring stroke/TIA symptoms.  She has remained on Plavix for secondary stroke prevention without side effects.  Blood pressure today 126/72. She does monitor at home and typically 110s/70s. She continues to follow with Dr. Estanislado Pandy for right ICA aneurysm and intracranial stenosis with recent imaging 10/2019 stable appearance. She plans on following up in 6 months for surveillance monitoring.  No concerns at this time.    REVIEW OF SYSTEMS: Full 14 system review of systems performed and notable only for those listed, all others are neg:  Constitutional: neg  Cardiovascular: neg Ear/Nose/Throat: neg  Skin: neg Eyes: neg Respiratory: neg Gastroitestinal: neg  Hematology/Lymphatic: neg  Endocrine: neg Musculoskeletal: R shoulder pain Allergy/Immunology: neg Neurological: neg Psychiatric: neg Sleep : neg   ALLERGIES: Allergies  Allergen Reactions   Influenza Vaccines Anaphylaxis   Atorvastatin Other (See Comments)    Joint pain / myalgias   Demerol Other (See Comments)    SEIZURE   Nsaids Other (See Comments)    NAPROXEN, IBUPROFEN, SKELAXIN---  CAUSES PALPITATIONS    Pravastatin Other (See Comments)    Myalgias    Prednisone Other (See Comments)    Caused her glucose to go up to 500 and went to ED   Rosuvastatin Other (See Comments)    Joint stiffness and muscle pain   Roxicodone [Oxycodone] Itching and Other (See Comments)    Hallucinations and itching   Tramadol Other (See Comments)    Contraindicated with Victoza    Covid-19 (Mrna) Vaccine     Patient had anaphylactic reaction to flu vaccine   Codeine Itching   Mango Flavor [Flavoring Agent] Itching    HOME MEDICATIONS: Outpatient Medications Prior to Visit  Medication Sig Dispense Refill   albuterol (VENTOLIN HFA) 108 (90 Base) MCG/ACT inhaler Inhale 2 puffs into the lungs every 6 (six) hours as needed for wheezing or shortness of breath. (Patient not taking: Reported on 12/21/2021) 18 g 2   Blood Glucose Monitoring Suppl (ONE TOUCH ULTRA 2) w/Device KIT Use to check glucose up to twice daily. E11.9 dx code 1 kit 0   cetirizine (ZYRTEC) 10 MG tablet Take 1 tablet (10 mg total) by mouth daily. 90 tablet 3   clopidogrel (PLAVIX) 75 MG tablet TAKE ONE TABLET BY MOUTH EVERY MORNING 90 tablet 3   empagliflozin (JARDIANCE) 10 MG TABS tablet Take 1 tablet (10 mg total) by mouth every morning. 90 tablet 3   famotidine (PEPCID) 40 MG tablet TAKE ONE TABLET BY MOUTH EVERY MORNING 30 tablet 2   fluticasone (FLONASE) 50 MCG/ACT nasal spray Place 2 sprays into both nostrils daily.     fluticasone (FLOVENT HFA) 220 MCG/ACT inhaler INHALE 1 PUFF BY  MOUTH INTO LUNGS TWICE DAILY. MAY INCREASE TO TWO PUFFS IF needed (Patient taking differently: Inhale 2 puffs into the lungs 2 (two) times daily as needed (shortness of breath).) 12 g 5   furosemide (LASIX) 20 MG tablet TAKE ONE TABLET BY MOUTH EVERY MORNING 90 tablet 1   gabapentin (NEURONTIN) 300 MG capsule TAKE TWO CAPSULES BY MOUTH EVERY MORNING and TAKE TWO CAPSULES BY MOUTH EVERYDAY AT BEDTIME 360 capsule 3   glucose blood (ONETOUCH VERIO) test strip 1 each by  Other route daily. Use as instructed     ipratropium (ATROVENT) 0.03 % nasal spray Place 1 spray into the nose every morning. 60 mL 3   linagliptin (TRADJENTA) 5 MG TABS tablet TAKE 1/2 TABLET BY MOUTH EVERY MORNING 45 tablet 1   lisinopril (ZESTRIL) 20 MG tablet TAKE ONE TABLET BY MOUTH EVERY MORNING 90 tablet 1   metFORMIN (GLUCOPHAGE-XR) 750 MG 24 hr tablet Take 1 tablet (750 mg total) by mouth 2 (two) times daily with a meal. 180 tablet 1   metoprolol tartrate (LOPRESSOR) 25 MG tablet Take 0.5 tablets (12.5 mg total) by mouth 2 (two) times daily. With morning and evening meal 90 tablet 0   Multiple Vitamins-Minerals (ADULT GUMMY PO) Take 2 capsules by mouth daily.     OneTouch Delica Lancets 96G MISC Use to check glucose up to twice daily. E11.9 dx code 200 each 3   ONETOUCH VERIO test strip USE TO CHECK BLOOD GLUCOSE UP TO TWICE DAILY 200 strip 6   Spacer/Aero-Holding Chambers DEVI Use with Flovent and albuterol inhalers 1 each 0   VITAMIN D PO Take 1 capsule by mouth daily.     No facility-administered medications prior to visit.    PAST MEDICAL HISTORY: Past Medical History:  Diagnosis Date   Asthma    Cerebral aneurysm    being watched by Dr. Kathee Delton every 6 months   Complication of anesthesia HYPOTENSION INTRAOP W/ HYSTERECTOMY IN 1987--  DID [place] 2008   Diabetes mellitus    DJD (degenerative joint disease)    Dyslipidemia    GERD (gastroesophageal reflux disease)    H/O hiatal hernia    History of CHF (congestive heart failure) 2010-  RESOLVED   History of peptic ulcer YRS AGO   Hypertension    Meniere's disease    Mitral valve prolapse occasional palpitations w/ chest discomfort   OA (osteoarthritis)    Rotator cuff tear, left    Stroke [facility])     PAST SURGICAL HISTORY: Past Surgical History:  Procedure Laterality Date   BUNIONECTOMY  2007   LEFT FOOT   IR 3D INDEPENDENT WKST  01/06/2021   IR ANGIO INTRA EXTRACRAN SEL COM CAROTID INNOMINATE BILAT  MOD SED  03/22/2019   IR ANGIO INTRA EXTRACRAN SEL COM CAROTID INNOMINATE BILAT MOD SED  01/06/2021   IR ANGIO VERTEBRAL SEL SUBCLAVIAN INNOMINATE UNI L MOD SED  03/22/2019   IR ANGIO VERTEBRAL SEL VERTEBRAL UNI R MOD SED  03/22/2019   IR ANGIO VERTEBRAL SEL VERTEBRAL UNI R MOD SED  01/06/2021   IR US GUIDE VASC ACCESS RIGHT  03/22/2019   IR US GUIDE VASC ACCESS RIGHT  01/06/2021   LEFT SHOULDER SURGERY  1997   ROTATOR CUFF REPAIR   REVERSE SHOULDER ARTHROPLASTY Right 07/30/2020   Procedure: REVERSE SHOULDER ARTHROPLASTY;  Surgeon: Hiram Gash, MD;  Location: [facility];  Service: Orthopedics;  Laterality: Right;   SHOULDER ARTHROSCOPY  09/20/2011   Procedure: ARTHROSCOPY  SHOULDER;  Surgeon: Johnn Hai, MD;  Location: [facility];  Service: Orthopedics;  Laterality: Left;  SAD AND MINI OPEN ROTATOR CUFF REPAIR     TOTAL KNEE ARTHROPLASTY  09-12-2006  [facility]   RIGHT KNEE   TOTAL KNEE ARTHROPLASTY Left 11/30/2017   Procedure: LEFT TOTAL KNEE REPLACEMENT;  Surgeon: [name], MD;  Location: [place];  Service: Orthopedics;  Laterality: Left;   [place];   1980   VAGINAL HYSTERECTOMY  1987    FAMILY HISTORY: Family History  Problem Relation Age of Onset   Heart disease Mother        in her 42s, heart attack   Diabetes Mother    Kidney disease Mother        dialysis   Thyroid disease Mother    Aneurysm Mother    Ovarian cancer Mother    Breast cancer Mother    Thyroid disease Sister    Heart disease Sister    Lymphoma Father    Diabetes Father    Hypertension Father    Congestive Heart Failure Father    Thyroid disease Brother    Thyroid disease Paternal Grandmother    Heart attack Sister        at age 15   Stroke Sister    Hypertension Sister    Breast cancer Maternal Aunt    Breast cancer Maternal Grandmother    Breast cancer Maternal Aunt    Breast cancer Maternal Aunt    Other Brother        Car accident   Bipolar  disorder Son    Other Son        GSW    SOCIAL HISTORY: Social History   Socioeconomic History   Marital status: Widowed    Spouse name: Not on file   Number of children: 4   Years of education: 16   Highest education level: Bachelor's degree (e.g., BA, AB, BS)  Occupational History   Occupation: LPN - retired  Tobacco Use   Smoking status: Never    Passive exposure: Never   Smokeless tobacco: Never  Vaping Use   Vaping Use: Never used  Substance and Sexual Activity   Alcohol use: No   Drug use: No   Sexual activity: Not Currently  Other Topics Concern   Not on file  Social History Narrative   Not on file   Social Determinants of Health   Financial Resource Strain: Low Risk  (06/26/2021)   Overall Financial Resource Strain (CARDIA)    Difficulty of Paying Living Expenses: Not hard at all  Food Insecurity: No Food Insecurity (12/03/2020)   Hunger Vital Sign    Worried About Running Out of Food in the Last Year: Never true    Ran Out of Food in the Last Year: Never true  Transportation Needs: No Transportation Needs (12/03/2020)   PRAPARE - Hydrologist (Medical): No    Lack of Transportation (Non-Medical): No  Physical Activity: Insufficiently Active (09/04/2021)   Exercise Vital Sign    Days of Exercise per Week: 5 days    Minutes of Exercise per Session: 10 min  Stress: Stress Concern Present (12/03/2020)   [place]    Feeling of Stress : Rather much  Social Connections: Moderately Integrated (12/03/2020)   Social Connection and Isolation Panel [NHANES]    Frequency of Communication with Friends and  Family: More than three times a week    Frequency of Social Gatherings with Friends and Family: More than three times a week    Attends Religious Services: More than 4 times per year    Active Member of Clubs or Organizations: Yes    Attends Archivist Meetings:  1 to 4 times per year    Marital Status: Widowed  Intimate Partner Violence: Not At Risk (12/03/2020)   Humiliation, Afraid, Rape, and Kick questionnaire    Fear of Current or Ex-Partner: No    Emotionally Abused: No    Physically Abused: No    Sexually Abused: No     PHYSICAL EXAM  There were no vitals filed for this visit.  There is no height or weight on file to calculate BMI.  Generalized: Well developed, very pleasant middle-age African-American female in no acute distress  Head: normocephalic and atraumatic Neck: Supple, no carotid bruits  Cardiac: Regular rate rhythm, no murmur  Musculoskeletal: Limited R shoulder ROM 2/2 pain Skin:  no rash/petichiae Vascular:  Normal pulses all extremities   Neurologic Exam Mental Status: Awake and fully alert.   Fluent speech and language.  Oriented to place and time. Recent and remote memory intact. Attention span, concentration and fund of knowledge appropriate. Mood and affect appropriate.  Cranial Nerves: Fundoscopic exam reveals sharp disc margins. Pupils equal, briskly reactive to light. Extraocular movements full without nystagmus. Visual fields full to confrontation. Hearing intact. Facial sensation intact. Face, tongue, palate moves normally and symmetrically.  Motor: Normal bulk and tone. Normal strength in all tested extremity muscles.  Limited testing right deltoid due to pain and decreased ROM Sensory.: intact to touch , pinprick , position and vibratory sensation.  Coordination: Rapid alternating movements normal in all extremities. Finger-to-nose and heel-to-shin performed accurately bilaterally. Gait and Station: Arises from chair without difficulty. Stance is normal. Gait demonstrates normal stride length and balance without use of assistive device Reflexes: 1+ and symmetric. Toes downgoing.    DIAGNOSTIC DATA (LABS, IMAGING, TESTING) - I reviewed patient records, labs, notes, testing and imaging myself where  available.  Lab Results  Component Value Date   WBC 6.3 01/06/2021   HGB 13.9 01/06/2021   HCT 41.0 01/06/2021   MCV 92.0 01/06/2021   PLT 302 01/06/2021      Component Value Date/Time   NA 141 07/20/2021 0938   K 4.3 07/20/2021 0938   CL 103 07/20/2021 0938   CO2 29 07/20/2021 0938   GLUCOSE 118 (H) 07/20/2021 0938   BUN 19 07/20/2021 0938   CREATININE 0.78 07/20/2021 0938   CREATININE 0.86 02/09/2014 1756   CALCIUM 9.4 07/20/2021 0938   PROT 6.4 07/20/2021 0938   ALBUMIN 4.2 07/20/2021 0938   AST 11 07/20/2021 0938   ALT 10 07/20/2021 0938   ALKPHOS 69 07/20/2021 0938   BILITOT 0.5 07/20/2021 0938   GFRNONAA >60 07/24/2020 1338   GFRAA >60 09/04/2019 1443   Lab Results  Component Value Date   CHOL 143 07/20/2021   HDL 68.60 07/20/2021   LDLCALC 60 07/20/2021   TRIG 71.0 07/20/2021   CHOLHDL 2 07/20/2021   Lab Results  Component Value Date   HGBA1C 6.6 (H) 07/20/2021   No results found for: "VITAMINB12" Lab Results  Component Value Date   TSH 1.06 06/02/2020      ASSESSMENT AND PLAN 71 y.o. African American female with PMH of HTN, DM with peripheral neuropathy, CHF, HLD, MVP, hypothyroidism, TIA 11/2014 and 04/2015, intracranial  vascular stenosis and right cavernous ICA aneurysm.  She is being seen today, 12/24/2021, for TIA follow-up and ***   Cleared to proceed with surgical procedure of right shoulder replacement from a stroke standpoint as she has been stable since her prior TIA and 2017 without new or reoccurring stroke/TIA symptoms.  Discussed holding Plavix for 3 to 5 days or as advised by orthopedic surgeon with small but acceptable preprocedure risk of stroke.  Advised to restart Plavix immediately after procedure or once hemodynamically stable and advised by orthopedic surgeon.  This was fully discussed with patient and she verbalized understanding.   Advised to follow-up on an as-needed basis   CC:  [facility] provider: Dr. Leonie Man Copland, Gay Filler,  MD   Ophelia Charter, MD (orthopedic surgeon)  I spent 20 minutes of face-to-face and non-face-to-face time with patient.  This included previsit chart review, lab review, study review, order entry, electronic health record documentation, patient education regarding history of prior TIAs, planned surgical procedure and risk from a stroke standpoint of holding Plavix as discussed above and answered all questions to patient satisfaction   Frann Rider, AGNP-BC  [facility] [address] [place], [place] [address]  Phone [phone] Fax [phone] Note: This document was prepared with digital dictation and possible smart phrase technology. Any transcriptional errors that result from this process are unintentional.

## 2021-12-23 NOTE — Telephone Encounter (Signed)
Spoke with Pharmacy technician Lysle Rubens at [facility] to have metformin and metoprolol in package to take with morning and evening meal. Change gabapentin to be in her bedtime package.

## 2021-12-24 ENCOUNTER — Ambulatory Visit: Payer: Medicare Other | Admitting: Adult Health

## 2021-12-25 ENCOUNTER — Other Ambulatory Visit: Payer: Self-pay | Admitting: Family Medicine

## 2021-12-25 DIAGNOSIS — J45901 Unspecified asthma with (acute) exacerbation: Secondary | ICD-10-CM

## 2021-12-27 NOTE — Progress Notes (Addendum)
[facility] [address], [address] [place], [place] [address] [phone]  Date:  12/28/2021   Name:  Sherry [name]   DOB:  [birth year]   MRN:  [id number]  PCP:  Pearline Cables, MD    Chief Complaint: elbow pain and memory issues (The elbow pain started after the fall that she mentioned at last OV. Luberta [facility], but the office canceled the appointment. So she would like a referral for memory. Theressa Stamps referral. /Concerns/ questions: Pt asks how long to antibodies last?)   History of Present Illness:  Sherry [name] is a 71 y.o. very pleasant female patient who presents with the following:  Patient seen today with concern of elbow pain and memory loss Most recent visit with myself was in May of this year History of diabetes, hypertension, peripheral neuropathy, TIA 2016, hyperlipidemia, intracranial vascular stenosis/cerebral aneurysm under 39-month surveillance  Foot exam is due- will update today  Can recommend Shingrix Needs urine microalbumin She is allergic to flu vaccination, allergic to COVID-19 vaccine  She has noted left elbow pain for a couple of months -she really only noticed that if she leans directly on the point of her elbow She is not aware of any previous injury  Plavix Jardiance 10 Flovent Lasix 20 daily Gabapentin 600 twice daily Tradjenta Lisinopril 20 Metformin 750 XR twice daily  Lab work done in [employer], lipid, A1c 6.6%  Most recent MR angiogram of her brain November 2022 Patient Active Problem List   Diagnosis Date Noted   Trochanteric bursitis, left hip 06/24/2021   Traumatic hematoma of left lower leg 09/26/2018   S/P total knee arthroplasty, left 03/29/2017   Spondylosis without myelopathy or radiculopathy, cervical region 03/29/2017   Bilateral carpal tunnel syndrome 03/29/2017   Acute asthma exacerbation 08/19/2016   Cervicalgia 03/23/2016   Vertigo 04/14/2015    Anxiety state 01/22/2015   HLD (hyperlipidemia) 01/22/2015   Type 2 diabetes mellitus with circulatory disorder (HCC) 01/22/2015   Intracranial vascular stenosis 01/22/2015   Aneurysm (HCC)    Headache    Essential hypertension 11/12/2014   Dependent edema 11/12/2014   Peripheral neuropathy 11/12/2014   Depression 11/12/2014   Facial droop    TIA (transient ischemic attack) 11/11/2014   Hyperthyroidism 08/06/2014   Meniere's disease 06/07/2014   Goiter 06/13/2013   Obesity, unspecified 06/13/2013    Past Medical History:  Diagnosis Date   Asthma    Cerebral aneurysm    being watched by Dr. Link Snuffer every 6 months   Complication of anesthesia HYPOTENSION INTRAOP W/ HYSTERECTOMY IN 1987--  DID OK W/ TOTAL KNEE IN 2008   Diabetes mellitus    DJD (degenerative joint disease)    Dyslipidemia    GERD (gastroesophageal reflux disease)    H/O hiatal hernia    History of CHF (congestive heart failure) 2010-  RESOLVED   History of peptic ulcer YRS AGO   Hypertension    Meniere's disease    Mitral valve prolapse occasional palpitations w/ chest discomfort   OA (osteoarthritis)    Rotator cuff tear, left    Stroke [facility])     Past Surgical History:  Procedure Laterality Date   BUNIONECTOMY  2007   LEFT FOOT   IR 3D INDEPENDENT WKST  01/06/2021   IR ANGIO INTRA EXTRACRAN SEL COM CAROTID INNOMINATE BILAT MOD SED  03/22/2019   IR ANGIO INTRA EXTRACRAN SEL COM CAROTID INNOMINATE BILAT MOD SED  01/06/2021  IR ANGIO VERTEBRAL SEL SUBCLAVIAN INNOMINATE UNI L MOD SED  03/22/2019   IR ANGIO VERTEBRAL SEL VERTEBRAL UNI R MOD SED  03/22/2019   IR ANGIO VERTEBRAL SEL VERTEBRAL UNI R MOD SED  01/06/2021   IR US GUIDE VASC ACCESS RIGHT  03/22/2019   IR US GUIDE VASC ACCESS RIGHT  01/06/2021   LEFT SHOULDER SURGERY  1997   ROTATOR CUFF REPAIR   REVERSE SHOULDER ARTHROPLASTY Right 07/30/2020   Procedure: REVERSE SHOULDER ARTHROPLASTY;  Surgeon: Bjorn Pippin, MD;  Location: [facility];  Service:  Orthopedics;  Laterality: Right;   SHOULDER ARTHROSCOPY  09/20/2011   Procedure: ARTHROSCOPY SHOULDER;  Surgeon: Javier Docker, MD;  Location: [facility];  Service: Orthopedics;  Laterality: Left;  SAD AND MINI OPEN ROTATOR CUFF REPAIR     TOTAL KNEE ARTHROPLASTY  09-12-2006  [facility]   RIGHT KNEE   TOTAL KNEE ARTHROPLASTY Left 11/30/2017   Procedure: LEFT TOTAL KNEE REPLACEMENT;  Surgeon: Eldred Manges, MD;  Location: [facility];  Service: Orthopedics;  Laterality: Left;   TUBAL LIGATION  1976   TYMPANOPLASTY  1990;   1980   VAGINAL HYSTERECTOMY  1987    Social History   Tobacco Use   Smoking status: Never    Passive exposure: Never   Smokeless tobacco: Never  Vaping Use   Vaping Use: Never used  Substance Use Topics   Alcohol use: No   Drug use: No    Family History  Problem Relation Age of Onset   Heart disease Mother        in her 61s, heart attack   Diabetes Mother    Kidney disease Mother        dialysis   Thyroid disease Mother    Aneurysm Mother    Ovarian cancer Mother    Breast cancer Mother    Thyroid disease Sister    Heart disease Sister    Lymphoma Father    Diabetes Father    Hypertension Father    Congestive Heart Failure Father    Thyroid disease Brother    Thyroid disease Paternal Grandmother    Heart attack Sister        at age 80   Stroke Sister    Hypertension Sister    Breast cancer Maternal Aunt    Breast cancer Maternal Grandmother    Breast cancer Maternal Aunt    Breast cancer Maternal Aunt    Other Brother        Car accident   Bipolar disorder Son    Other Son        GSW    Allergies  Allergen Reactions   Influenza Vaccines Anaphylaxis   Atorvastatin Other (See Comments)    Joint pain / myalgias   Demerol Other (See Comments)    SEIZURE   Nsaids Other (See Comments)    NAPROXEN, IBUPROFEN, SKELAXIN---  CAUSES PALPITATIONS   Pravastatin Other (See Comments)    Myalgias    Prednisone Other (See Comments)     Caused her glucose to go up to 500 and went to ED   Rosuvastatin Other (See Comments)    Joint stiffness and muscle pain   Roxicodone [Oxycodone] Itching and Other (See Comments)    Hallucinations and itching   Tramadol Other (See Comments)    Contraindicated with Victoza    Covid-19 (Mrna) Vaccine     Patient had anaphylactic reaction to flu vaccine   Codeine Itching   Mango Flavor [Flavoring  Agent] Itching    Medication list has been reviewed and updated.  Current Outpatient Medications on File Prior to Visit  Medication Sig Dispense Refill   albuterol (VENTOLIN HFA) 108 (90 Base) MCG/ACT inhaler Inhale 2 puffs into the lungs every 6 (six) hours as needed for wheezing or shortness of breath. 18 g 2   Blood Glucose Monitoring Suppl (ONE TOUCH ULTRA 2) w/Device KIT Use to check glucose up to twice daily. E11.9 dx code 1 kit 0   cetirizine (ZYRTEC) 10 MG tablet Take 1 tablet (10 mg total) by mouth daily. 90 tablet 3   clopidogrel (PLAVIX) 75 MG tablet TAKE ONE TABLET BY MOUTH EVERY MORNING 90 tablet 3   empagliflozin (JARDIANCE) 10 MG TABS tablet Take 1 tablet (10 mg total) by mouth every morning. 90 tablet 3   famotidine (PEPCID) 40 MG tablet TAKE ONE TABLET BY MOUTH EVERY MORNING 30 tablet 2   FLOVENT HFA 220 MCG/ACT inhaler INHALE 1 PUFF BY MOUTH INTO LUNGS twice A DAY. MAY INCREASE TO TWO PUFFS AS NEEDED 12 g 5   furosemide (LASIX) 20 MG tablet TAKE ONE TABLET BY MOUTH EVERY MORNING 90 tablet 1   gabapentin (NEURONTIN) 300 MG capsule TAKE TWO CAPSULES BY MOUTH EVERY MORNING and TAKE TWO CAPSULES BY MOUTH EVERYDAY AT BEDTIME 360 capsule 3   glucose blood (ONETOUCH VERIO) test strip 1 each by Other route daily. Use as instructed     ipratropium (ATROVENT) 0.03 % nasal spray Place 1 spray into the nose every morning. 60 mL 3   linagliptin (TRADJENTA) 5 MG TABS tablet TAKE 1/2 TABLET BY MOUTH EVERY MORNING 45 tablet 1   lisinopril (ZESTRIL) 20 MG tablet TAKE ONE TABLET BY MOUTH EVERY  MORNING 90 tablet 1   metFORMIN (GLUCOPHAGE-XR) 750 MG 24 hr tablet Take 1 tablet (750 mg total) by mouth 2 (two) times daily with a meal. 180 tablet 1   metoprolol tartrate (LOPRESSOR) 25 MG tablet Take 0.5 tablets (12.5 mg total) by mouth 2 (two) times daily. With morning and evening meal 90 tablet 0   Multiple Vitamins-Minerals (ADULT GUMMY PO) Take 2 capsules by mouth daily.     OneTouch Delica Lancets 33G MISC Use to check glucose up to twice daily. E11.9 dx code 200 each 3   ONETOUCH VERIO test strip USE TO CHECK BLOOD GLUCOSE UP TO TWICE DAILY 200 strip 6   Spacer/Aero-Holding Chambers DEVI Use with Flovent and albuterol inhalers 1 each 0   VITAMIN D PO Take 1 capsule by mouth daily.     No current facility-administered medications on file prior to visit.    Review of Systems:  As per HPI- otherwise negative.   Physical Examination: Vitals:   12/28/21 1105  BP: 112/72  Pulse: 68  Resp: 18  Temp: 97.7 F (36.5 C)  SpO2: 98%   Vitals:   12/28/21 1105  Weight: 192 lb 9.6 oz (87.4 kg)  Height: 5\' 1"  (1.549 m)   Body mass index is 36.39 kg/m. Ideal Body Weight: Weight in (lb) to have BMI = 25: 132  GEN: no acute distress.  Obese, looks well HEENT: Atraumatic, Normocephalic.  Ears and Nose: No external deformity. CV: RRR, No M/G/R. No JVD. No thrill. No extra heart sounds. PULM: CTA B, no wheezes, crackles, rhonchi. No retractions. No resp. distress. No accessory muscle use. ABD: S, NT, ND, +BS. No rebound. No HSM. EXTR: No c/c/e PSYCH: Normally interactive. Conversant.  Foot exam completed today Left elbow is normal  and nontender on exam, no redness or effusion noted.  Normal range of motion   Assessment and Plan: Type 2 diabetes mellitus with complication, without long-term current use of insulin (HCC) - Plan: Comprehensive metabolic panel, Hemoglobin A1c, Microalbumin / creatinine urine ratio  Essential hypertension - Plan: CBC, Comprehensive metabolic panel,  TSH  Hyperlipidemia, unspecified hyperlipidemia type  Mild intermittent asthma with exacerbation  Peripheral polyneuropathy  Allergic rhinitis, unspecified seasonality, unspecified trigger - Plan: fluticasone (FLONASE) 50 MCG/ACT nasal spray  Left elbow pain - Plan: DG Elbow Complete Left  Patient seen today for follow-up.  Labs are pending as above Blood pressure is well controlled on current regimen Follow-up on diabetes Refilled her Flonase Plain films of left elbow  Signed Abbe Amsterdam, MD  Received labs as below, message to patient  Results for orders placed or performed in visit on 12/28/21  CBC  Result Value Ref Range   WBC 6.0 4.0 - 10.5 K/uL   RBC 5.05 3.87 - 5.11 Mil/uL   Platelets 284.0 150.0 - 400.0 K/uL   Hemoglobin 14.6 12.0 - 15.0 g/dL   HCT 16.1 09.6 - 04.5 %   MCV 89.9 78.0 - 100.0 fl   MCHC 32.2 30.0 - 36.0 g/dL   RDW 40.9 81.1 - 91.4 %  Comprehensive metabolic panel  Result Value Ref Range   Sodium 142 135 - 145 mEq/L   Potassium 4.4 3.5 - 5.1 mEq/L   Chloride 102 96 - 112 mEq/L   CO2 27 19 - 32 mEq/L   Glucose, Bld 116 (H) 70 - 99 mg/dL   BUN 20 6 - 23 mg/dL   Creatinine, Ser 7.82 0.40 - 1.20 mg/dL   Total Bilirubin 0.6 0.2 - 1.2 mg/dL   Alkaline Phosphatase 75 39 - 117 U/L   AST 12 0 - 37 U/L   ALT 10 0 - 35 U/L   Total Protein 6.8 6.0 - 8.3 g/dL   Albumin 4.3 3.5 - 5.2 g/dL   GFR 95.62 >13.08 mL/min   Calcium 9.6 8.4 - 10.5 mg/dL  Hemoglobin M5H  Result Value Ref Range   Hgb A1c MFr Bld 7.4 (H) 4.6 - 6.5 %  Microalbumin / creatinine urine ratio  Result Value Ref Range   Microalb, Ur <0.7 0.0 - 1.9 mg/dL   Creatinine,U 84.6 mg/dL   Microalb Creat Ratio 3.2 0.0 - 30.0 mg/g  TSH  Result Value Ref Range   TSH 2.50 0.35 - 5.50 uIU/mL

## 2021-12-27 NOTE — Patient Instructions (Incomplete)
It was great to see you again today, I will be in touch with your labs

## 2021-12-28 ENCOUNTER — Telehealth: Payer: Self-pay | Admitting: Family Medicine

## 2021-12-28 ENCOUNTER — Encounter: Payer: Self-pay | Admitting: Family Medicine

## 2021-12-28 ENCOUNTER — Ambulatory Visit (INDEPENDENT_AMBULATORY_CARE_PROVIDER_SITE_OTHER): Payer: Medicare Other | Admitting: Family Medicine

## 2021-12-28 ENCOUNTER — Ambulatory Visit (HOSPITAL_BASED_OUTPATIENT_CLINIC_OR_DEPARTMENT_OTHER)
Admission: RE | Admit: 2021-12-28 | Discharge: 2021-12-28 | Disposition: A | Discharge status: Home / Self Care | Payer: Medicare Other | Source: Ambulatory Visit | Attending: Family Medicine | Admitting: Family Medicine

## 2021-12-28 VITALS — BP 112/72 | HR 68 | Temp 97.7°F | Resp 18 | Ht 61.0 in | Wt 192.6 lb

## 2021-12-28 DIAGNOSIS — I10 Essential (primary) hypertension: Secondary | ICD-10-CM | POA: Diagnosis not present

## 2021-12-28 DIAGNOSIS — G629 Polyneuropathy, unspecified: Secondary | ICD-10-CM

## 2021-12-28 DIAGNOSIS — J4521 Mild intermittent asthma with (acute) exacerbation: Secondary | ICD-10-CM

## 2021-12-28 DIAGNOSIS — E118 Type 2 diabetes mellitus with unspecified complications: Secondary | ICD-10-CM | POA: Diagnosis not present

## 2021-12-28 DIAGNOSIS — M25522 Pain in left elbow: Secondary | ICD-10-CM

## 2021-12-28 DIAGNOSIS — J309 Allergic rhinitis, unspecified: Secondary | ICD-10-CM

## 2021-12-28 DIAGNOSIS — E785 Hyperlipidemia, unspecified: Secondary | ICD-10-CM | POA: Diagnosis not present

## 2021-12-28 DIAGNOSIS — Z111 Encounter for screening for respiratory tuberculosis: Secondary | ICD-10-CM

## 2021-12-28 LAB — CBC
HCT: 45.4 % (ref 36.0–46.0)
Hemoglobin: 14.6 g/dL (ref 12.0–15.0)
MCHC: 32.2 g/dL (ref 30.0–36.0)
MCV: 89.9 fl (ref 78.0–100.0)
Platelets: 284 10*3/uL (ref 150.0–400.0)
RBC: 5.05 Mil/uL (ref 3.87–5.11)
RDW: 14.6 % (ref 11.5–15.5)
WBC: 6 10*3/uL (ref 4.0–10.5)

## 2021-12-28 LAB — TSH: TSH: 2.5 u[IU]/mL (ref 0.35–5.50)

## 2021-12-28 LAB — HEMOGLOBIN A1C: Hgb A1c MFr Bld: 7.4 % — ABNORMAL HIGH (ref 4.6–6.5)

## 2021-12-28 MED ORDER — FLUTICASONE PROPIONATE 50 MCG/ACT NA SUSP: 2.0000 | Freq: Every day | NASAL | 6 refills | Status: AC

## 2021-12-28 NOTE — Telephone Encounter (Signed)
Verbal obtained from Dr Lorelei Pont. Pt coming back to have PPD placed.

## 2021-12-28 NOTE — Addendum Note (Signed)
Addended by: Legrand Rams on: 12/28/2021 03:54 PM   Modules accepted: Orders

## 2021-12-28 NOTE — Progress Notes (Signed)
PPD Placement note Sherry [name], 71 y.o. female is here today for placement of PPD test Reason for PPD test: New job Pt taken PPD test before: yes Verified in allergy area and with patient that they are not allergic to the products PPD is made of (Phenol or Tween). Yes Is patient taking any oral or IV steroid medication now or have they taken it in the last month? no Has the patient ever received the BCG vaccine?: no Has the patient been in recent contact with anyone known or suspected of having active TB disease?: no      Date of exposure (if applicable): n/a      Name of person they were exposed to (if applicable): n/a Patient's Country of origin?: [place] O: Alert and oriented in NAD. P:  PPD placed on 12/28/2021 L forearm.  Patient advised to return for reading within 48-72 hours.

## 2021-12-28 NOTE — Telephone Encounter (Signed)
Okay for order?

## 2021-12-28 NOTE — Telephone Encounter (Signed)
Patient states she needs a skin TB test for her job done. Please advise.

## 2021-12-29 ENCOUNTER — Encounter: Payer: Self-pay | Admitting: Family Medicine

## 2021-12-29 LAB — COMPREHENSIVE METABOLIC PANEL
ALT: 10 U/L (ref 0–35)
AST: 12 U/L (ref 0–37)
Albumin: 4.3 g/dL (ref 3.5–5.2)
Alkaline Phosphatase: 75 U/L (ref 39–117)
BUN: 20 mg/dL (ref 6–23)
CO2: 27 mEq/L (ref 19–32)
Calcium: 9.6 mg/dL (ref 8.4–10.5)
Chloride: 102 mEq/L (ref 96–112)
Creatinine, Ser: 0.79 mg/dL (ref 0.40–1.20)
GFR: 75.49 mL/min (ref >60.00)
Glucose, Bld: 116 mg/dL — ABNORMAL HIGH (ref 70–99)
Potassium: 4.4 mEq/L (ref 3.5–5.1)
Sodium: 142 mEq/L (ref 135–145)
Total Bilirubin: 0.6 mg/dL (ref 0.2–1.2)
Total Protein: 6.8 g/dL (ref 6.0–8.3)

## 2021-12-29 LAB — MICROALBUMIN / CREATININE URINE RATIO
Creatinine,U: 22.1 mg/dL
Microalb Creat Ratio: 3.2 mg/g (ref 0.0–30.0)
Microalb, Ur: <0.7 mg/dL (ref 0.0–1.9)

## 2021-12-30 ENCOUNTER — Ambulatory Visit: Payer: Medicare Other

## 2021-12-30 ENCOUNTER — Encounter: Payer: Self-pay | Admitting: Family Medicine

## 2021-12-30 LAB — TB SKIN TEST
Induration: 0 mm
TB Skin Test: NEGATIVE

## 2021-12-30 NOTE — Progress Notes (Signed)
Pt came in for the PPD Read and it was neg and letter was written for pt.   PPD Reading Note PPD read and results entered in [facility]. Result: 0 mm induration. Interpretation: Negative  If test not read within 48-72 hours of initial placement, patient advised to repeat in other arm 1-3 weeks after this test. Allergic reaction: no

## 2022-01-08 ENCOUNTER — Telehealth: Payer: Self-pay | Admitting: Family Medicine

## 2022-01-08 DIAGNOSIS — Z111 Encounter for screening for respiratory tuberculosis: Secondary | ICD-10-CM

## 2022-01-08 NOTE — Telephone Encounter (Signed)
Patient called to advise that she needed a second ppd test 1-3 weeks from the first one per her employer. Please call patient to advise.

## 2022-01-08 NOTE — Telephone Encounter (Signed)
That would be 01/18/22 correct? Okay to order?

## 2022-01-08 NOTE — Telephone Encounter (Signed)
Yes, that timing is fine.  I placed the order.  Can you give the patient a call back and get her scheduled for a nurse visit

## 2022-01-11 NOTE — Telephone Encounter (Signed)
Pt scheduled  

## 2022-01-12 ENCOUNTER — Other Ambulatory Visit: Payer: Self-pay | Admitting: Family Medicine

## 2022-01-12 DIAGNOSIS — E118 Type 2 diabetes mellitus with unspecified complications: Secondary | ICD-10-CM

## 2022-01-18 NOTE — Progress Notes (Deleted)
PPD Placement note Sherry [name], 71 y.o. female is here today for placement of PPD test Reason for PPD test: *** Pt taken PPD test before: {yes no:[id number]} Verified in allergy area and with patient that they are not allergic to the products PPD is made of (Phenol or Tween). {yes [id number]} Is patient taking any oral or IV steroid medication now or have they taken it in the last month? {yes [id number]} Has the patient ever received the BCG vaccine?: {yes no:[id number]} Has the patient been in recent contact with anyone known or suspected of having active TB disease?: {yes no:[id number]}      Date of exposure (if applicable): ***      Name of person they were exposed to (if applicable): *** Patient's Country of origin?: *** O: Alert and oriented in NAD. P:  PPD placed on 01/18/2022.  Patient advised to return for reading within 48-72 hours.

## 2022-01-19 ENCOUNTER — Ambulatory Visit: Payer: Medicare Other

## 2022-01-19 DIAGNOSIS — Z111 Encounter for screening for respiratory tuberculosis: Secondary | ICD-10-CM

## 2022-02-16 ENCOUNTER — Other Ambulatory Visit: Payer: Self-pay | Admitting: Family Medicine

## 2022-02-16 DIAGNOSIS — R0982 Postnasal drip: Secondary | ICD-10-CM

## 2022-03-15 ENCOUNTER — Telehealth: Payer: Medicare Other

## 2022-03-15 ENCOUNTER — Telehealth: Payer: Self-pay | Admitting: Pharmacist

## 2022-03-15 NOTE — Telephone Encounter (Signed)
Attenpted to outreach patient today by Clinical Pharmacist Practitioner. No answer. LM on VM with CB# [phone] or [phone]

## 2022-03-17 ENCOUNTER — Other Ambulatory Visit: Payer: Self-pay | Admitting: Family Medicine

## 2022-03-17 DIAGNOSIS — I10 Essential (primary) hypertension: Secondary | ICD-10-CM

## 2022-03-18 ENCOUNTER — Encounter: Payer: Self-pay | Admitting: *Deleted

## 2022-03-25 LAB — HEMOGLOBIN A1C: Hemoglobin A1C: 7.2

## 2022-03-26 ENCOUNTER — Telehealth: Payer: Self-pay

## 2022-03-26 NOTE — Telephone Encounter (Signed)
[name] from [facility] for a AWV called, she states she went out to pt house to do a home visit and pt stated she is having worsening SOB and has bi-lateral edema in legs and feet, also a dry cough for two months. Called and spoke with Pt and advised her per Jodi Mourning, FNP to go in to the ED to get evaluated. Pt stated she has to work tonight from 11pm to 7:00am and will go to ED in the morning.

## 2022-04-16 ENCOUNTER — Other Ambulatory Visit: Payer: Self-pay | Admitting: Family Medicine

## 2022-04-16 DIAGNOSIS — Z98818 Other dental procedure status: Secondary | ICD-10-CM

## 2022-04-21 ENCOUNTER — Telehealth: Payer: Self-pay | Admitting: Family Medicine

## 2022-04-21 DIAGNOSIS — J4521 Mild intermittent asthma with (acute) exacerbation: Secondary | ICD-10-CM

## 2022-04-21 MED ORDER — QVAR REDIHALER 80 MCG/ACT IN AERB: 1.0000 | INHALATION_SPRAY | Freq: Two times a day (BID) | RESPIRATORY_TRACT | 6 refills | Status: DC

## 2022-04-21 NOTE — Telephone Encounter (Signed)
Please advise 

## 2022-04-21 NOTE — Telephone Encounter (Signed)
[facility] called to let PCP know that they no longer make the Flovent inhaler and the generic brand is on back order for a long time. Pharmacy would like for a new inhaler to be sent instead. Please advice.

## 2022-04-21 NOTE — Telephone Encounter (Signed)
Called [facility] for her- can sub qvar Sent in rx

## 2022-04-21 NOTE — Addendum Note (Signed)
Addended by: [name] C on: 04/21/2022 01:06 PM   Modules accepted: Orders

## 2022-05-25 ENCOUNTER — Other Ambulatory Visit: Payer: Self-pay

## 2022-05-25 ENCOUNTER — Encounter (HOSPITAL_BASED_OUTPATIENT_CLINIC_OR_DEPARTMENT_OTHER): Payer: Self-pay | Admitting: Emergency Medicine

## 2022-05-25 ENCOUNTER — Emergency Department (HOSPITAL_BASED_OUTPATIENT_CLINIC_OR_DEPARTMENT_OTHER): Payer: Medicare Other

## 2022-05-25 ENCOUNTER — Emergency Department (HOSPITAL_BASED_OUTPATIENT_CLINIC_OR_DEPARTMENT_OTHER)
Admission: EM | Admit: 2022-05-25 | Discharge: 2022-05-25 | Disposition: A | Discharge status: Home / Self Care | Payer: Medicare Other | Attending: Emergency Medicine | Admitting: Emergency Medicine

## 2022-05-25 DIAGNOSIS — M7989 Other specified soft tissue disorders: Secondary | ICD-10-CM | POA: Diagnosis not present

## 2022-05-25 DIAGNOSIS — I110 Hypertensive heart disease with heart failure: Secondary | ICD-10-CM | POA: Diagnosis not present

## 2022-05-25 DIAGNOSIS — I509 Heart failure, unspecified: Secondary | ICD-10-CM | POA: Diagnosis not present

## 2022-05-25 DIAGNOSIS — Z7984 Long term (current) use of oral hypoglycemic drugs: Secondary | ICD-10-CM | POA: Insufficient documentation

## 2022-05-25 DIAGNOSIS — R0602 Shortness of breath: Secondary | ICD-10-CM | POA: Diagnosis not present

## 2022-05-25 DIAGNOSIS — E119 Type 2 diabetes mellitus without complications: Secondary | ICD-10-CM | POA: Diagnosis not present

## 2022-05-25 DIAGNOSIS — Z8711 Personal history of peptic ulcer disease: Secondary | ICD-10-CM | POA: Insufficient documentation

## 2022-05-25 DIAGNOSIS — R2243 Localized swelling, mass and lump, lower limb, bilateral: Secondary | ICD-10-CM | POA: Diagnosis present

## 2022-05-25 DIAGNOSIS — R519 Headache, unspecified: Secondary | ICD-10-CM | POA: Insufficient documentation

## 2022-05-25 DIAGNOSIS — I872 Venous insufficiency (chronic) (peripheral): Secondary | ICD-10-CM | POA: Diagnosis not present

## 2022-05-25 DIAGNOSIS — Z7901 Long term (current) use of anticoagulants: Secondary | ICD-10-CM | POA: Insufficient documentation

## 2022-05-25 DIAGNOSIS — J45909 Unspecified asthma, uncomplicated: Secondary | ICD-10-CM | POA: Diagnosis not present

## 2022-05-25 DIAGNOSIS — R600 Localized edema: Secondary | ICD-10-CM | POA: Diagnosis not present

## 2022-05-25 LAB — COMPREHENSIVE METABOLIC PANEL
ALT: 14 U/L (ref 0–44)
AST: 16 U/L (ref 15–41)
Albumin: 3.9 g/dL (ref 3.5–5.0)
Alkaline Phosphatase: 77 U/L (ref 38–126)
Anion gap: 10 (ref 5–15)
BUN: 19 mg/dL (ref 8–23)
CO2: 27 mmol/L (ref 22–32)
Calcium: 8.9 mg/dL (ref 8.9–10.3)
Chloride: 102 mmol/L (ref 98–111)
Creatinine, Ser: 0.65 mg/dL (ref 0.44–1.00)
GFR, Estimated: >60 mL/min (ref >60)
Glucose, Bld: 143 mg/dL — ABNORMAL HIGH (ref 70–99)
Potassium: 3.5 mmol/L (ref 3.5–5.1)
Sodium: 139 mmol/L (ref 135–145)
Total Bilirubin: 0.8 mg/dL (ref 0.3–1.2)
Total Protein: 6.9 g/dL (ref 6.5–8.1)

## 2022-05-25 LAB — CBC WITH DIFFERENTIAL/PLATELET
Abs Immature Granulocytes: 0.04 10*3/uL (ref 0.00–0.07)
Basophils Absolute: 0.1 10*3/uL (ref 0.0–0.1)
Basophils Relative: 1 %
Eosinophils Absolute: 0.2 10*3/uL (ref 0.0–0.5)
Eosinophils Relative: 3 %
HCT: 45.1 % (ref 36.0–46.0)
Hemoglobin: 14 g/dL (ref 12.0–15.0)
Immature Granulocytes: 1 %
Lymphocytes Relative: 47 %
Lymphs Abs: 3.8 10*3/uL (ref 0.7–4.0)
MCH: 28.5 pg (ref 26.0–34.0)
MCHC: 31 g/dL (ref 30.0–36.0)
MCV: 91.9 fL (ref 80.0–100.0)
Monocytes Absolute: 0.6 10*3/uL (ref 0.1–1.0)
Monocytes Relative: 7 %
Neutro Abs: 3.3 10*3/uL (ref 1.7–7.7)
Neutrophils Relative %: 41 %
Platelets: 272 10*3/uL (ref 150–400)
RBC: 4.91 MIL/uL (ref 3.87–5.11)
RDW: 13.7 % (ref 11.5–15.5)
WBC: 7.9 10*3/uL (ref 4.0–10.5)
nRBC: 0 % (ref 0.0–0.2)

## 2022-05-25 LAB — TROPONIN I (HIGH SENSITIVITY): Troponin I (High Sensitivity): 3 ng/L (ref <18)

## 2022-05-25 LAB — BRAIN NATRIURETIC PEPTIDE: B Natriuretic Peptide: 56.3 pg/mL (ref 0.0–100.0)

## 2022-05-25 MED ORDER — ACETAMINOPHEN 500 MG PO TABS
1000.0000 mg | ORAL_TABLET | Freq: Once | ORAL | Status: AC
  Administered 2022-05-25: 1000 mg via ORAL
  Filled 2022-05-25: qty 2

## 2022-05-25 MED ORDER — FUROSEMIDE 10 MG/ML IJ SOLN
20.0000 mg | Freq: Once | INTRAMUSCULAR | Status: AC
  Administered 2022-05-25: 20 mg via INTRAVENOUS
  Filled 2022-05-25: qty 2

## 2022-05-25 NOTE — Discharge Instructions (Addendum)
We evaluated you for your leg swelling.  This is probably due to the veins in your legs not working as well (venous insufficiency).  You can take an extra dose of your Lasix for the next 2 to 3 days to help pee off a little bit of extra fluid.  Please also buy compression stockings for your legs.  Your testing was overall reassuring.  Your leg ultrasound did not show signs of any blood clots and your other testing did not show any sign of heart failure.  Your head CT did not show any signs of any intracranial bleeding or ruptured aneurysm.  Please follow-up with your primary care doctor.  Please return to the emergency department if you develop any new or worrisome symptoms.

## 2022-05-25 NOTE — ED Triage Notes (Signed)
Left leg swelling x several months, saw her NP and was told to come to ER . States  gained 10 lbs in 2 weeks she states , some sob she states  has hx of CHF

## 2022-05-25 NOTE — ED Provider Notes (Signed)
[facility] Provider Note  CSN: [id number] Arrival date & time: 05/25/22 0865  Chief Complaint(s) Leg Swelling  HPI MALEAHA HUGHETT is a 72 y.o. female history of hypertension, prior history of CHF which resolved on recheck of echocardiogram, diabetes presenting to the emergency department with shortness of breath.  Patient reports shortness of breath, orthopnea recently.  Also having some lower extremity swelling which has been worsening over a few months.  She reports that her left leg is more swollen than the right leg.  No fevers or chills, no chest pain.  No cough, no runny nose or sore throat.  No abdominal pain.  Symptoms are mild.   Past Medical History Past Medical History:  Diagnosis Date   Asthma    Cerebral aneurysm    being watched by Dr. Link Snuffer every 6 months   Complication of anesthesia HYPOTENSION INTRAOP W/ HYSTERECTOMY IN 1987--  DID OK W/ TOTAL KNEE IN 2008   Diabetes mellitus    DJD (degenerative joint disease)    Dyslipidemia    GERD (gastroesophageal reflux disease)    H/O hiatal hernia    History of CHF (congestive heart failure) 2010-  RESOLVED   History of peptic ulcer YRS AGO   Hypertension    Meniere's disease    Mitral valve prolapse occasional palpitations w/ chest discomfort   OA (osteoarthritis)    Rotator cuff tear, left    Stroke    Patient Active Problem List   Diagnosis Date Noted   Trochanteric bursitis, left hip 06/24/2021   Traumatic hematoma of left lower leg 09/26/2018   S/P total knee arthroplasty, left 03/29/2017   Spondylosis without myelopathy or radiculopathy, cervical region 03/29/2017   Bilateral carpal tunnel syndrome 03/29/2017   Acute asthma exacerbation 08/19/2016   Cervicalgia 03/23/2016   Vertigo 04/14/2015   Anxiety state 01/22/2015   HLD (hyperlipidemia) 01/22/2015   Type 2 diabetes mellitus with circulatory disorder 01/22/2015   Intracranial vascular stenosis  01/22/2015   Aneurysm    Headache    Essential hypertension 11/12/2014   Dependent edema 11/12/2014   Peripheral neuropathy 11/12/2014   Depression 11/12/2014   Facial droop    TIA (transient ischemic attack) 11/11/2014   Hyperthyroidism 08/06/2014   Meniere's disease 06/07/2014   Goiter 06/13/2013   Obesity, unspecified 06/13/2013   Home Medication(s) Prior to Admission medications   Medication Sig Start Date End Date Taking? Authorizing Provider  albuterol (VENTOLIN HFA) 108 (90 Base) MCG/ACT inhaler Inhale 2 puffs into the lungs every 6 (six) hours as needed for wheezing or shortness of breath. 04/28/20   Copland, Gwenlyn Found, MD  amoxicillin (AMOXIL) 500 MG capsule TAKE FOUR CAPSULES BY MOUTH prior TO dental work 04/16/22   Copland, Gwenlyn Found, MD  beclomethasone (QVAR REDIHALER) 80 MCG/ACT inhaler Inhale 1 puff into the lungs 2 (two) times daily. 04/21/22   Copland, Gwenlyn Found, MD  Blood Glucose Monitoring Suppl (ONE TOUCH ULTRA 2) w/Device KIT Use to check glucose up to twice daily. E11.9 dx code 10/29/19   Copland, Gwenlyn Found, MD  cetirizine (ZYRTEC) 10 MG tablet Take 1 tablet (10 mg total) by mouth daily. 07/16/16   Copland, Gwenlyn Found, MD  clopidogrel (PLAVIX) 75 MG tablet TAKE ONE TABLET BY MOUTH EVERY MORNING 08/12/21   Copland, Gwenlyn Found, MD  empagliflozin (JARDIANCE) 10 MG TABS tablet Take 1 tablet (10 mg total) by mouth every morning. 11/19/21   Copland, Gwenlyn Found, MD  famotidine (PEPCID) 40 MG  tablet Take 1 tablet (40 mg total) by mouth every morning. 01/12/22   Copland, Gwenlyn Found, MD  FLOVENT HFA 220 MCG/ACT inhaler INHALE 1 PUFF BY MOUTH INTO LUNGS twice A DAY. MAY INCREASE TO TWO PUFFS AS NEEDED 12/25/21   Copland, Gwenlyn Found, MD  fluticasone (FLONASE) 50 MCG/ACT nasal spray Place 2 sprays into both nostrils daily. 12/28/21   Copland, Gwenlyn Found, MD  furosemide (LASIX) 20 MG tablet TAKE ONE TABLET BY MOUTH EVERY MORNING 03/18/22   Copland, Gwenlyn Found, MD  gabapentin (NEURONTIN) 300 MG  capsule TAKE TWO CAPSULES BY MOUTH EVERY MORNING and TAKE TWO CAPSULES BY MOUTH EVERYDAY AT BEDTIME 08/12/21   Copland, Gwenlyn Found, MD  glucose blood (ONETOUCH VERIO) test strip 1 each by Other route daily. Use as instructed    [provider]  ipratropium (ATROVENT) 0.03 % nasal spray Place 1 spray into both nostrils in the morning. 02/16/22   Copland, Gwenlyn Found, MD  linagliptin (TRADJENTA) 5 MG TABS tablet Take 0.5 tablets (2.5 mg total) by mouth every morning. 01/12/22   Copland, Gwenlyn Found, MD  lisinopril (ZESTRIL) 20 MG tablet TAKE ONE TABLET BY MOUTH EVERY MORNING 03/18/22   Copland, Gwenlyn Found, MD  metFORMIN (GLUCOPHAGE-XR) 750 MG 24 hr tablet TAKE ONE TABLET BY MOUTH EVERY MORNING and TAKE ONE TABLET BY MOUTH EVERY EVENING 03/18/22   Copland, Gwenlyn Found, MD  metoprolol tartrate (LOPRESSOR) 25 MG tablet TAKE 1/2 TABLET BY MOUTH EVERY MORNING and TAKE 1/2 TABLET BY MOUTH EVERY EVENING 03/18/22   Copland, Gwenlyn Found, MD  Multiple Vitamins-Minerals (ADULT GUMMY PO) Take 2 capsules by mouth daily.    [provider]  OneTouch Delica Lancets 33G MISC Use to check glucose up to twice daily. E11.9 dx code 10/29/19   Copland, Gwenlyn Found, MD  [facility] VERIO test strip USE TO CHECK BLOOD GLUCOSE UP TO TWICE DAILY 05/25/21   Copland, Gwenlyn Found, MD  Spacer/Aero-Holding [facility] Use with Flovent and albuterol inhalers 03/30/21   Seabron Spates R, DO  VITAMIN D PO Take 1 capsule by mouth daily.    [provider]                                                                                                                                    Past Surgical History Past Surgical History:  Procedure Laterality Date   BUNIONECTOMY  2007   LEFT FOOT   IR 3D INDEPENDENT WKST  01/06/2021   IR ANGIO INTRA EXTRACRAN SEL COM CAROTID INNOMINATE BILAT MOD SED  03/22/2019   IR ANGIO INTRA EXTRACRAN SEL COM CAROTID INNOMINATE BILAT MOD SED  01/06/2021   IR ANGIO VERTEBRAL SEL SUBCLAVIAN INNOMINATE  UNI L MOD SED  03/22/2019   IR ANGIO VERTEBRAL SEL VERTEBRAL UNI R MOD SED  03/22/2019   IR ANGIO VERTEBRAL SEL VERTEBRAL UNI R MOD SED  01/06/2021   IR US GUIDE VASC ACCESS  RIGHT  03/22/2019   IR US GUIDE VASC ACCESS RIGHT  01/06/2021   LEFT SHOULDER SURGERY  1997   ROTATOR CUFF REPAIR   REVERSE SHOULDER ARTHROPLASTY Right 07/30/2020   Procedure: REVERSE SHOULDER ARTHROPLASTY;  Surgeon: Bjorn Pippin, MD;  Location: [facility];  Service: Orthopedics;  Laterality: Right;   SHOULDER ARTHROSCOPY  09/20/2011   Procedure: ARTHROSCOPY SHOULDER;  Surgeon: Javier Docker, MD;  Location: [facility];  Service: Orthopedics;  Laterality: Left;  SAD AND MINI OPEN ROTATOR CUFF REPAIR     TOTAL KNEE ARTHROPLASTY  09-12-2006  [facility]   RIGHT KNEE   TOTAL KNEE ARTHROPLASTY Left 11/30/2017   Procedure: LEFT TOTAL KNEE REPLACEMENT;  Surgeon: Eldred Manges, MD;  Location: [facility];  Service: Orthopedics;  Laterality: Left;   TUBAL LIGATION  1976   TYMPANOPLASTY  1990;   1980   VAGINAL HYSTERECTOMY  1987   Family History Family History  Problem Relation Age of Onset   Heart disease Mother        in her 39s, heart attack   Diabetes Mother    Kidney disease Mother        dialysis   Thyroid disease Mother    Aneurysm Mother    Ovarian cancer Mother    Breast cancer Mother    Thyroid disease Sister    Heart disease Sister    Lymphoma Father    Diabetes Father    Hypertension Father    Congestive Heart Failure Father    Thyroid disease Brother    Thyroid disease Paternal Grandmother    Heart attack Sister        at age 70   Stroke Sister    Hypertension Sister    Breast cancer Maternal Aunt    Breast cancer Maternal Grandmother    Breast cancer Maternal Aunt    Breast cancer Maternal Aunt    Other Brother        Car accident   Bipolar disorder Son    Other Son        GSW    Social History Social History   Tobacco Use   Smoking status: Never    Passive exposure: Never    Smokeless tobacco: Never  Vaping Use   Vaping Use: Never used  Substance Use Topics   Alcohol use: No   Drug use: No   Allergies Influenza vaccines, Atorvastatin, Demerol, Nsaids, Pravastatin, Prednisone, Rosuvastatin, Roxicodone [oxycodone], Tramadol, Covid-19 (mrna) vaccine, Codeine, and Mango flavor [flavoring agent]  Review of Systems Review of Systems  All other systems reviewed and are negative.   Physical Exam Vital Signs  I have reviewed the triage vital signs BP (!) 167/81   Pulse 71   Temp 98.4 F (36.9 C)   Resp 15   Ht 5\' 1"  (1.549 m)   Wt 87.4 kg   SpO2 100%   BMI 36.41 kg/m  Physical Exam Vitals and nursing note reviewed.  Constitutional:      General: She is not in acute distress.    Appearance: She is well-developed.  HENT:     Head: Normocephalic and atraumatic.     Mouth/Throat:     Mouth: Mucous membranes are moist.  Eyes:     Pupils: Pupils are equal, round, and reactive to light.  Neck:     Vascular: Hepatojugular reflux and JVD present.  Cardiovascular:     Rate and Rhythm: Normal rate and regular rhythm.     Heart sounds: No  murmur heard. Pulmonary:     Effort: Pulmonary effort is normal. No respiratory distress.     Breath sounds: Normal breath sounds.  Abdominal:     General: Abdomen is flat.     Palpations: Abdomen is soft.     Tenderness: There is no abdominal tenderness.  Musculoskeletal:        General: No tenderness.     Right lower leg: Edema present.     Left lower leg: Edema present.     Comments: 2+ R 3+L  Skin:    General: Skin is warm and dry.  Neurological:     General: No focal deficit present.     Mental Status: She is alert. Mental status is at baseline.  Psychiatric:        Mood and Affect: Mood normal.        Behavior: Behavior normal.     ED Results and Treatments Labs (all labs ordered are listed, but only abnormal results are displayed) Labs Reviewed  COMPREHENSIVE METABOLIC PANEL - Abnormal; Notable  for the following components:      Result Value   Glucose, Bld 143 (*)    All other components within normal limits  CBC WITH DIFFERENTIAL/PLATELET  BRAIN NATRIURETIC PEPTIDE  TROPONIN I (HIGH SENSITIVITY)                                                                                                                          Radiology CT Head Wo Contrast  Result Date: 05/25/2022 CLINICAL DATA:  Headache, sudden, severe. EXAM: CT HEAD WITHOUT CONTRAST TECHNIQUE: Contiguous axial images were obtained from the base of the skull through the vertex without intravenous contrast. RADIATION DOSE REDUCTION: This exam was performed according to the departmental dose-optimization program which includes automated exposure control, adjustment of the mA and/or kV according to patient size and/or use of iterative reconstruction technique. COMPARISON:  Head CT 11/16/2020. FINDINGS: Brain: No acute intracranial hemorrhage. Gray-white differentiation is preserved. No hydrocephalus or extra-axial collection. No mass effect or midline shift. Vascular: No hyperdense vessel or unexpected calcification. Skull: No calvarial fracture or suspicious bone lesion. Skull base is unremarkable. Sinuses/Orbits: Unremarkable. Other: None. IMPRESSION: No acute intracranial abnormality. Electronically Signed   By: Orvan Falconer M.D.   On: 05/25/2022 12:30   US Venous Img Lower  Left (DVT Study)  Result Date: 05/25/2022 CLINICAL DATA:  Left lower extremity edema for the past several months. Evaluate for DVT. EXAM: LEFT LOWER EXTREMITY VENOUS DOPPLER ULTRASOUND TECHNIQUE: Gray-scale sonography with graded compression, as well as color Doppler and duplex ultrasound were performed to evaluate the lower extremity deep venous systems from the level of the common femoral vein and including the common femoral, femoral, profunda femoral, popliteal and calf veins including the posterior tibial, peroneal and gastrocnemius veins when visible.  The superficial great saphenous vein was also interrogated. Spectral Doppler was utilized to evaluate flow at rest and with distal augmentation maneuvers in the common femoral, femoral and popliteal  veins. COMPARISON:  None Available. FINDINGS: Contralateral Common Femoral Vein: Respiratory phasicity is normal and symmetric with the symptomatic side. No evidence of thrombus. Normal compressibility. Common Femoral Vein: No evidence of thrombus. Normal compressibility, respiratory phasicity and response to augmentation. Saphenofemoral Junction: No evidence of thrombus. Normal compressibility and flow on color Doppler imaging. Profunda Femoral Vein: No evidence of thrombus. Normal compressibility and flow on color Doppler imaging. Femoral Vein: No evidence of thrombus. Normal compressibility, respiratory phasicity and response to augmentation. Popliteal Vein: No evidence of thrombus. Normal compressibility, respiratory phasicity and response to augmentation. Calf Veins: No evidence of thrombus. Normal compressibility and flow on color Doppler imaging. Superficial Great Saphenous Vein: No evidence of thrombus. Normal compressibility. Other Findings:  None. IMPRESSION: No evidence of DVT within the left lower extremity. Electronically Signed   By: Simonne Come M.D.   On: 05/25/2022 11:05   DG Chest Port 1 View  Result Date: 05/25/2022 CLINICAL DATA:  Left leg swelling for several months. Shortness of breath. EXAM: PORTABLE CHEST 1 VIEW COMPARISON:  Chest radiograph 09/04/2019 FINDINGS: The cardiomediastinal silhouette is normal. There is no focal consolidation or pulmonary edema. There is no pleural effusion or pneumothorax There is no acute osseous abnormality. Right shoulder arthroplasty hardware is noted. IMPRESSION: No radiographic evidence of acute cardiopulmonary process. Electronically Signed   By: Lesia Hausen M.D.   On: 05/25/2022 10:58    Pertinent labs & imaging results that were available during my care  of the patient were reviewed by me and considered in my medical decision making (see MDM for details).  Medications Ordered in ED Medications  furosemide (LASIX) injection 20 mg (20 mg Intravenous Given 05/25/22 1033)  acetaminophen (TYLENOL) tablet 1,000 mg (1,000 mg Oral Given 05/25/22 1155)                                                                                                                                     Procedures Procedures  (including critical care time)  Medical Decision Making / ED Course   MDM:  72 year old female presenting to the emergency department with leg swelling.  Patient well-appearing, she has reassuring vital signs.  Physical exam with mild JVD and hepatojugular reflex, bilateral lower extremity left greater than right.  Lungs are clear on exam.  Patient not hypoxic.  Differential includes venous stasis changes, CHF, DVT, renal dysfunction, hypoalbuminemia.  Will check labs, including BNP.  Will obtain ultrasound of the left lower extremity given increased swelling as compared to the right.  No tenderness.  No erythema or warmth.  May have asymmetric chronic venous stasis changes.  Patient reports dyspnea but her lungs are clear.  Will check chest x-ray.  She does have mild JVD suggesting at least mild CHF.  Patient is overall very well-appearing and vital signs are reassuring, if workup is reassuring anticipate discharge, likely increase Lasix for short period and follow-up with PMD for consideration of  repeating her echocardiogram.  Clinical Course as of 05/25/22 1239  Tue May 25, 2022  1141 Patient now complaining of acute headache.  She reports this began after she got a saline flush.  She does report a history of intracranial aneurysm.  Given age and acute nature will obtain CT scan although patient is very well-appearing and low concern for subarachnoid hemorrhage.  She is alert and oriented.  No focal deficits.  Will give Tylenol.  If negative  anticipate discharge, laboratory workup overall reassuring, no elevation in BNP or troponin, chest x-ray is clear, no evidence of DVT on left lower extremity ultrasound.  Suspect ultimate cause of her leg swelling is likely venous insufficiency.  [WS]  1238 Head CT negative. Will discharge patient to home. All questions answered. Patient comfortable with plan of discharge. Return precautions discussed with patient and specified on the after visit summary.  [WS]    Clinical Course User Index [WS] Lonell Grandchild, MD     Additional history obtained:  -External records from outside source obtained and reviewed including: Chart review including previous notes, labs, imaging, consultation notes including prior imaging of possible aneurism    Lab Tests: -I ordered, reviewed, and interpreted labs.   The pertinent results include:   Labs Reviewed  COMPREHENSIVE METABOLIC PANEL - Abnormal; Notable for the following components:      Result Value   Glucose, Bld 143 (*)    All other components within normal limits  CBC WITH DIFFERENTIAL/PLATELET  BRAIN NATRIURETIC PEPTIDE  TROPONIN I (HIGH SENSITIVITY)    Notable for mild hyperglycemia  EKG   EKG Interpretation  Date/Time:  Tuesday May 25 2022 09:52:37 EDT Ventricular Rate:  66 PR Interval:  156 QRS Duration: 102 QT Interval:  462 QTC Calculation: 485 R Axis:   85 Text Interpretation: Sinus rhythm Borderline right axis deviation Borderline T abnormalities, anterior leads Confirmed by Alvino Blood ([id number]) on 05/25/2022 10:26:23 AM         Imaging Studies ordered: I ordered imaging studies including CXR, DVT US , CT head On my interpretation imaging demonstrates no acute process I independently visualized and interpreted imaging. I agree with the radiologist interpretation   Medicines ordered and prescription drug management: Meds ordered this encounter  Medications   furosemide (LASIX) injection 20 mg    acetaminophen (TYLENOL) tablet 1,000 mg    -I have reviewed the patients home medicines and have made adjustments as needed   Cardiac Monitoring: The patient was maintained on a cardiac monitor.  I personally viewed and interpreted the cardiac monitored which showed an underlying rhythm of: NSR  Social Determinants of Health:  Diagnosis or treatment significantly limited by social determinants of health: obesity   Reevaluation: After the interventions noted above, I reevaluated the patient and found that their symptoms have improved  Co morbidities that complicate the patient evaluation  Past Medical History:  Diagnosis Date   Asthma    Cerebral aneurysm    being watched by Dr. Link Snuffer every 6 months   Complication of anesthesia HYPOTENSION INTRAOP W/ HYSTERECTOMY IN 1987--  DID OK W/ TOTAL KNEE IN 2008   Diabetes mellitus    DJD (degenerative joint disease)    Dyslipidemia    GERD (gastroesophageal reflux disease)    H/O hiatal hernia    History of CHF (congestive heart failure) 2010-  RESOLVED   History of peptic ulcer YRS AGO   Hypertension    Meniere's disease    Mitral valve prolapse occasional palpitations  w/ chest discomfort   OA (osteoarthritis)    Rotator cuff tear, left    Stroke       Dispostion: Disposition decision including need for hospitalization was considered, and patient discharged from emergency department.    Final Clinical Impression(s) / ED Diagnoses Final diagnoses:  Venous insufficiency of both lower extremities     This chart was dictated using voice recognition software.  Despite best efforts to proofread,  errors can occur which can change the documentation meaning.    Lonell Grandchild, MD 05/25/22 1239

## 2022-05-25 NOTE — ED Notes (Signed)
US in progress at bedside.

## 2022-07-07 ENCOUNTER — Encounter (HOSPITAL_COMMUNITY): Payer: Self-pay | Admitting: *Deleted

## 2022-07-07 ENCOUNTER — Telehealth: Payer: Self-pay

## 2022-07-07 ENCOUNTER — Emergency Department (HOSPITAL_COMMUNITY): Payer: Medicare Other

## 2022-07-07 ENCOUNTER — Emergency Department (HOSPITAL_COMMUNITY)
Admission: EM | Admit: 2022-07-07 | Discharge: 2022-07-08 | Disposition: A | Discharge status: Home / Self Care | Payer: Medicare Other | Attending: Emergency Medicine | Admitting: Emergency Medicine

## 2022-07-07 ENCOUNTER — Telehealth: Payer: Self-pay | Admitting: Family Medicine

## 2022-07-07 ENCOUNTER — Other Ambulatory Visit: Payer: Self-pay

## 2022-07-07 DIAGNOSIS — E119 Type 2 diabetes mellitus without complications: Secondary | ICD-10-CM | POA: Insufficient documentation

## 2022-07-07 DIAGNOSIS — R072 Precordial pain: Secondary | ICD-10-CM | POA: Insufficient documentation

## 2022-07-07 DIAGNOSIS — R0789 Other chest pain: Secondary | ICD-10-CM | POA: Diagnosis not present

## 2022-07-07 DIAGNOSIS — Z79899 Other long term (current) drug therapy: Secondary | ICD-10-CM | POA: Diagnosis not present

## 2022-07-07 DIAGNOSIS — I509 Heart failure, unspecified: Secondary | ICD-10-CM | POA: Insufficient documentation

## 2022-07-07 DIAGNOSIS — Z7984 Long term (current) use of oral hypoglycemic drugs: Secondary | ICD-10-CM | POA: Insufficient documentation

## 2022-07-07 DIAGNOSIS — Z7951 Long term (current) use of inhaled steroids: Secondary | ICD-10-CM | POA: Diagnosis not present

## 2022-07-07 DIAGNOSIS — R079 Chest pain, unspecified: Secondary | ICD-10-CM | POA: Diagnosis not present

## 2022-07-07 DIAGNOSIS — J45909 Unspecified asthma, uncomplicated: Secondary | ICD-10-CM | POA: Insufficient documentation

## 2022-07-07 DIAGNOSIS — M542 Cervicalgia: Secondary | ICD-10-CM | POA: Diagnosis not present

## 2022-07-07 DIAGNOSIS — R5383 Other fatigue: Secondary | ICD-10-CM | POA: Diagnosis not present

## 2022-07-07 DIAGNOSIS — I110 Hypertensive heart disease with heart failure: Secondary | ICD-10-CM | POA: Diagnosis not present

## 2022-07-07 DIAGNOSIS — I6523 Occlusion and stenosis of bilateral carotid arteries: Secondary | ICD-10-CM | POA: Diagnosis not present

## 2022-07-07 LAB — BASIC METABOLIC PANEL
Anion gap: 13 (ref 5–15)
BUN: 17 mg/dL (ref 8–23)
CO2: 24 mmol/L (ref 22–32)
Calcium: 9 mg/dL (ref 8.9–10.3)
Chloride: 103 mmol/L (ref 98–111)
Creatinine, Ser: 1.04 mg/dL — ABNORMAL HIGH (ref 0.44–1.00)
GFR, Estimated: 57 mL/min — ABNORMAL LOW (ref >60)
Glucose, Bld: 210 mg/dL — ABNORMAL HIGH (ref 70–99)
Potassium: 3.6 mmol/L (ref 3.5–5.1)
Sodium: 140 mmol/L (ref 135–145)

## 2022-07-07 LAB — CBC
HCT: 44.1 % (ref 36.0–46.0)
Hemoglobin: 13.6 g/dL (ref 12.0–15.0)
MCH: 28.3 pg (ref 26.0–34.0)
MCHC: 30.8 g/dL (ref 30.0–36.0)
MCV: 91.7 fL (ref 80.0–100.0)
Platelets: 269 10*3/uL (ref 150–400)
RBC: 4.81 MIL/uL (ref 3.87–5.11)
RDW: 14.3 % (ref 11.5–15.5)
WBC: 8.3 10*3/uL (ref 4.0–10.5)
nRBC: 0 % (ref 0.0–0.2)

## 2022-07-07 LAB — TROPONIN I (HIGH SENSITIVITY): Troponin I (High Sensitivity): 3 ng/L (ref <18)

## 2022-07-07 NOTE — ED Triage Notes (Signed)
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

## 2022-07-07 NOTE — Telephone Encounter (Signed)
See Sherry [name]'s note in other telephone note. Pt having shob, chest pain- sent to ED.

## 2022-07-07 NOTE — Telephone Encounter (Signed)
Caller Name Cidney Naff Caller Phone Number [phone] Patient Name Sherry [name] Patient DOB [birth year] Call Type Message Only Information Provided Reason for Call Request to Schedule Office Appointment Initial Comment Caller states she has pain shooting her her neck to her shoulder/arm. Disp. Time Disposition Final User 07/07/2022 11:44:42 AM General Information Provided Yes Lynnell Grain Call Closed By: Lynnell Grain Transaction Date/Time: 07/07/2022 11:41:07 AM (ET)

## 2022-07-07 NOTE — Telephone Encounter (Signed)
FYI: This call has been transferred to Access Nurse. Once the result note has been entered staff can address the message at that time.  Patient called in with the following symptoms:  Red Word: chest pain, neck/arm pain   Please advise at [facility] [phone]  Message is routed to Provider Pool and [facility] Triage

## 2022-07-07 NOTE — Telephone Encounter (Signed)
Spoke with pt on triage line, pt states she has been having chest pain radiating down left arm and SOB onset 07/04/2022, pt wanted her PCP to be aware, I spoke with Dr.Copland pt was advised to go ahead and head to the emergency room , pt stated she was in her car and would head over to [facility], advised pt I would stay on the phone with her until she arrived pt declined.

## 2022-07-08 ENCOUNTER — Emergency Department (HOSPITAL_COMMUNITY): Payer: Medicare Other

## 2022-07-08 DIAGNOSIS — I6523 Occlusion and stenosis of bilateral carotid arteries: Secondary | ICD-10-CM | POA: Diagnosis not present

## 2022-07-08 LAB — TROPONIN I (HIGH SENSITIVITY): Troponin I (High Sensitivity): 4 ng/L (ref <18)

## 2022-07-08 MED ORDER — IOHEXOL 350 MG/ML SOLN
75.0000 mL | Freq: Once | INTRAVENOUS | Status: AC | PRN
  Administered 2022-07-08: 75 mL via INTRAVENOUS

## 2022-07-08 MED ORDER — ONDANSETRON HCL 4 MG/2ML IJ SOLN
4.0000 mg | Freq: Once | INTRAMUSCULAR | Status: AC
  Administered 2022-07-08: 4 mg via INTRAVENOUS
  Filled 2022-07-08: qty 2

## 2022-07-08 NOTE — ED Provider Notes (Signed)
[place] EMERGENCY DEPARTMENT AT [facility] Provider Note   CSN: [id number] Arrival date & time: 07/07/22  1708     History  Chief Complaint  Patient presents with   Chest Pain    Sherry [name] is a 72 y.o. female.  The history is provided by the patient and a relative.  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  She reports on her most recent night shift (around 24 hrs ago) she started having pain in her left neck into her left arm.  At times she also has associated chest pain.  She also reports feeling more out of breath. No fevers or vomiting.  No severe headache.  She reports having recent visual changes in the past several weeks of having difficulty seeing distance.  No acute visual change tonight. Patient also reports she is feeling more forgetful.  There is been no change in her speech pattern.  No focal weakness is reported. Patient reports that she called her PCP about these findings and was directed to the ER.  She also spoke to her neurointerventionalist who also recommended ER evaluation Patient presents with her daughter who confirms the patient is having forgetfulness.  No new medications.  No traumas reported    Past Medical History:  Diagnosis Date   Asthma    Cerebral aneurysm    being watched by Dr. Link Snuffer every 6 months   Complication of anesthesia HYPOTENSION INTRAOP W/ HYSTERECTOMY IN 1987--  DID OK W/ TOTAL KNEE IN 2008   Diabetes mellitus    DJD (degenerative joint disease)    Dyslipidemia    GERD (gastroesophageal reflux disease)    H/O hiatal hernia    History of CHF (congestive heart failure) 2010-  RESOLVED   History of peptic ulcer YRS AGO   Hypertension    Meniere's disease    Mitral valve prolapse occasional palpitations w/ chest discomfort   OA (osteoarthritis)    Rotator cuff tear, left    Stroke [facility])      Home Medications Prior to Admission medications   Medication Sig Start Date End Date Taking? Authorizing Provider  albuterol (VENTOLIN HFA) 108 (90 Base) MCG/ACT inhaler Inhale 2 puffs into the lungs every 6 (six) hours as needed for wheezing or shortness of breath. 04/28/20  Yes Copland, Gwenlyn Found, MD  beclomethasone (QVAR REDIHALER) 80 MCG/ACT inhaler Inhale 1 puff into the lungs 2 (two) times daily. 04/21/22  Yes Copland, Gwenlyn Found, MD  cetirizine (ZYRTEC) 10 MG tablet Take 1 tablet (10 mg total) by mouth daily. 07/16/16  Yes Copland, Gwenlyn Found, MD  Cholecalciferol (VITAMIN D) 50 MCG (2000 UT) tablet Take 2,000 Units by mouth daily.   Yes [provider]  clopidogrel (PLAVIX) 75 MG tablet TAKE ONE TABLET BY MOUTH EVERY MORNING 08/12/21  Yes Copland, Gwenlyn Found, MD  empagliflozin (JARDIANCE) 10 MG TABS tablet Take 1 tablet (10 mg total) by mouth every morning. Patient taking differently: Take 5 mg by mouth every morning. 11/19/21  Yes Copland, Gwenlyn Found, MD  famotidine (PEPCID) 40 MG tablet Take 1 tablet (40 mg total) by mouth every morning. 01/12/22  Yes Copland, Gwenlyn Found, MD  FLOVENT HFA 220 MCG/ACT inhaler INHALE 1 PUFF BY MOUTH INTO LUNGS twice A DAY. MAY INCREASE TO TWO PUFFS AS NEEDED 12/25/21  Yes Copland, Gwenlyn Found, MD  fluticasone (FLONASE) 50 MCG/ACT nasal spray Place 2 sprays into both nostrils  daily. 12/28/21  Yes Copland, Gwenlyn Found, MD  furosemide (LASIX) 20 MG tablet TAKE ONE TABLET BY MOUTH EVERY MORNING 03/18/22  Yes Copland, Gwenlyn Found, MD  gabapentin (NEURONTIN) 300 MG capsule TAKE TWO CAPSULES BY MOUTH EVERY MORNING and TAKE TWO CAPSULES BY MOUTH EVERYDAY AT BEDTIME 08/12/21  Yes Copland, Jessica C, MD  ipratropium (ATROVENT) 0.03 % nasal spray Place 1 spray into both nostrils in the morning. 02/16/22  Yes Copland, Gwenlyn Found, MD  linagliptin (TRADJENTA) 5 MG TABS tablet Take 0.5 tablets (2.5 mg total) by mouth every morning. 01/12/22  Yes Copland, Gwenlyn Found, MD  lisinopril  (ZESTRIL) 20 MG tablet TAKE ONE TABLET BY MOUTH EVERY MORNING 03/18/22  Yes Copland, Gwenlyn Found, MD  metFORMIN (GLUCOPHAGE-XR) 750 MG 24 hr tablet TAKE ONE TABLET BY MOUTH EVERY MORNING and TAKE ONE TABLET BY MOUTH EVERY EVENING 03/18/22  Yes Copland, Gwenlyn Found, MD  metoprolol tartrate (LOPRESSOR) 25 MG tablet TAKE 1/2 TABLET BY MOUTH EVERY MORNING and TAKE 1/2 TABLET BY MOUTH EVERY EVENING 03/18/22  Yes Copland, Gwenlyn Found, MD  Multiple Vitamins-Minerals (ADULT GUMMY PO) Take 2 capsules by mouth daily.   Yes [provider]  Blood Glucose Monitoring Suppl (ONE TOUCH ULTRA 2) w/Device KIT Use to check glucose up to twice daily. E11.9 dx code 10/29/19   Copland, Gwenlyn Found, MD  glucose blood (ONETOUCH VERIO) test strip 1 each by Other route daily. Use as instructed    [provider]  OneTouch Delica Lancets 33G MISC Use to check glucose up to twice daily. E11.9 dx code 10/29/19   Copland, Gwenlyn Found, MD  [facility] VERIO test strip USE TO CHECK BLOOD GLUCOSE UP TO TWICE DAILY 05/25/21   Copland, Gwenlyn Found, MD  Spacer/Aero-Holding Rudean Curt Use with Flovent and albuterol inhalers 03/30/21   Seabron Spates R, DO      Allergies    Influenza vaccines, Atorvastatin, Demerol, Nsaids, Pravastatin, Prednisone, Rosuvastatin, Roxicodone [oxycodone], Tramadol, Covid-19 (mrna) vaccine, Codeine, and Mango flavor [flavoring agent]    Review of Systems   Review of Systems  Constitutional:  Negative for fever.  Cardiovascular:  Negative for chest pain.  Gastrointestinal:  Negative for abdominal pain and vomiting.  Neurological:  Negative for weakness, numbness and headaches.    Physical Exam Updated Vital Signs BP 120/65   Pulse 62   Temp 97.8 F (36.6 C) (Oral)   Resp 19   Ht 1.549 m (5\' 1" )   Wt 87.4 kg   SpO2 100%   BMI 36.41 kg/m  Physical Exam CONSTITUTIONAL: Well developed/well nourished, elderly, smiling HEAD: Normocephalic/atraumatic EYES: EOMI/PERRL, no nystagmus ENMT:  Mucous membranes moist NECK: supple no meningeal signs, no bruits No bruising, no erythema or edema to her neck CV: S1/S2 noted, no murmurs/rubs/gallops noted LUNGS: Lungs are clear to auscultation bilaterally, no apparent distress ABDOMEN: soft, nontender, no rebound or guarding GU:no cva tenderness NEURO:Awake/alert, face symmetric, no arm or leg drift is noted Equal 5/5 strength with shoulder abduction, elbow flex/extension, wrist flex/extension in upper extremities and equal hand grips bilaterally Equal 5/5 strength with hip flexion,knee flex/extension, foot dorsi/plantar flexion Cranial nerves 3/4/5/6/08/16/08/11/12 tested and intact Sensation to light touch intact in all extremities No dysarthria Good recall during history taking EXTREMITIES: pulses normal, full ROM, no erythema or tenderness noted to the left shoulder, full range of motion noted SKIN: warm, color normal PSYCH: no abnormalities of mood noted  ED Results / Procedures / Treatments   Labs (all labs ordered are listed, but  only abnormal results are displayed) Labs Reviewed  BASIC METABOLIC PANEL - Abnormal; Notable for the following components:      Result Value   Glucose, Bld 210 (*)    Creatinine, Ser 1.04 (*)    GFR, Estimated 57 (*)    All other components within normal limits  CBC  TROPONIN I (HIGH SENSITIVITY)  TROPONIN I (HIGH SENSITIVITY)    EKG EKG Interpretation  Date/Time:  Wednesday Jul 07 2022 17:38:22 EDT Ventricular Rate:  72 PR Interval:  152 QRS Duration: 84 QT Interval:  388 QTC Calculation: 424 R Axis:   77 Text Interpretation: Normal sinus rhythm with sinus arrhythmia Abnormal ECG Interpretation limited secondary to artifact Confirmed by Zadie Rhine ([id number]) on 07/08/2022 12:23:25 AM  Radiology CT ANGIO HEAD NECK W WO CM  Result Date: 07/08/2022 CLINICAL DATA:  Neck pain and left shoulder and arm pain, difficulty thinking, nausea EXAM: CT ANGIOGRAPHY HEAD AND NECK WITH AND  WITHOUT CONTRAST TECHNIQUE: Multidetector CT imaging of the head and neck was performed using the standard protocol during bolus administration of intravenous contrast. Multiplanar CT image reconstructions and MIPs were obtained to evaluate the vascular anatomy. Carotid stenosis measurements (when applicable) are obtained utilizing NASCET criteria, using the distal internal carotid diameter as the denominator. RADIATION DOSE REDUCTION: This exam was performed according to the departmental dose-optimization program which includes automated exposure control, adjustment of the mA and/or kV according to patient size and/or use of iterative reconstruction technique. CONTRAST:  75mL OMNIPAQUE IOHEXOL 350 MG/ML SOLN COMPARISON:  05/25/2022 CT head, 12/17/2020 MRA head FINDINGS: CT HEAD FINDINGS Brain: No evidence of acute infarct, hemorrhage, mass, mass effect, or midline shift. No hydrocephalus or extra-axial fluid collection. Vascular: No hyperdense vessel. Skull: Negative for fracture or focal lesion. Sinuses/Orbits: No acute finding. Other: The mastoid air cells are well aerated. CTA NECK FINDINGS Aortic arch: Standard branching. Imaged portion shows no evidence of aneurysm or dissection. No significant stenosis of the major arch vessel origins. Right carotid system: No evidence of stenosis, dissection, or occlusion. Left carotid system: No evidence of stenosis, dissection, or occlusion. Vertebral arteries: Nonopacification of the left vertebral artery at its origin and in the V1 segment. Minimal opacification of the left V2 segment, likely secondary to collaterals, with better opacification of the left V3 segment; this correlates with findings on the 01/06/2021 angiogram. The right vertebral artery is patent from its origin to the skull base without significant stenosis. Skeleton: No acute osseous abnormality. Degenerative changes in the cervical spine. Other neck: Multinodular goiter, which appears similar to the  03/23/2016 CT cervical spine. Upper chest: No focal pulmonary opacity or pleural effusion. Review of the MIP images confirms the above findings CTA HEAD FINDINGS Anterior circulation: Both internal carotid arteries are patent to the termini, with mild stenosis in the left supraclinoid segment, likely similar to the prior exam when accounting for differences in technique. No evidence of aneurysm. 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 Aplastic left A1. Patent right A1. Normal anterior communicating artery. Anterior cerebral arteries are patent to their distal aspects without significant stenosis. No M1 stenosis or occlusion. MCA branches perfused to their distal aspects without significant stenosis. Posterior circulation: The right vertebral artery is dominant and patent to the vertebrobasilar junction without significant stenosis. Minimal opacification of the diminutive left V4, likely reflecting suboptimal collateral flow given the occluded proximal left vertebral  artery. Posterior inferior cerebellar arteries patent proximally. Basilar patent to its distal aspect without significant stenosis. Superior cerebellar arteries patent proximally. Patent right P 1. Hypoplastic left P1. Near fetal origin of the left PCA from the left posterior communicating artery. Mild stenosis in the proximal right P2 and moderate stenosis in the distal right P2. Mild stenosis in the mid left P2. Venous sinuses: As permitted by contrast timing, patent. Anatomic variants: Near fetal origin of the left PCA. Review of the MIP images confirms the above findings IMPRESSION: 1. No acute intracranial process. 2. Nonopacification of the left vertebral artery at its origin and in the V1 segment, with minimal opacification of the left V2 and V3 segments, likely reflecting suboptimal collateral flow, consistent  with findings on the 01/06/2021 angiogram. 3. No intracranial large vessel occlusion. Mild and moderate stenosis in the right greater than left PCA s, described above and similar to the prior angiogram. 4. No hemodynamically significant stenosis in the neck. 5. No evidence of aneurysm. 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 Electronically Signed   By: Wiliam Ke M.D.   On: 07/08/2022 02:35   DG Chest 2 View  Result Date: 07/07/2022 CLINICAL DATA:  Chest pain for 3 days. EXAM: CHEST - 2 VIEW COMPARISON:  May 25, 2022 FINDINGS: The heart size and mediastinal contours are within normal limits. Low lung volumes are noted. A right shoulder replacement is seen. Multilevel degenerative changes are noted throughout the thoracic spine. IMPRESSION: No active cardiopulmonary disease. Electronically Signed   By: Aram Candela M.D.   On: 07/07/2022 18:31    Procedures Procedures    Medications Ordered in ED Medications  iohexol (OMNIPAQUE) 350 MG/ML injection 75 mL (75 mLs Intravenous Contrast Given 07/08/22 0211)  ondansetron (ZOFRAN) injection 4 mg (4 mg Intravenous Given 07/08/22 0236)    ED Course/ Medical Decision Making/ A&P Clinical Course as of 07/08/22 0416  Thu Jul 08, 2022  0124 Glucose(!): 210 Mild hyperglycemia [DW]  0132 Patient presents for multiple complaints.  She had mentioned some chest and shoulder pain, but overall her cardiac workup was unremarkable.  She also reports left-sided neck pain, and also mention some forgetfulness.  At this time she has no signs of any change in mental status and no focal neurodeficits.  It does appear she has not had any recent neuroimaging.  Will obtain CT angio head and neck [DW]  0413 Overall workup was unremarkable.  No acute findings on CT angio.  Patient is ambulatory without any issues.  No focal weakness. She does report increasing fatigue with  exertion, though not acute tonight.  Workup in this regard has been unrevealing, no signs of CHF or anemia  Patient had distant history of TIA, but never had a full CVA. At this point I have low suspicion for acute CVA/TIA.  No signs or symptoms of vertebrobasilar insufficiency at this time  However it has been several years since she has seen neurology.  She will be referred as an outpatient [DW]  [phone] Patient is awake and alert, no acute distress, no focal weakness and has been ambulatory.  In terms of her reported chest pain, low suspicion for ACS/PE/dissection.  Patient will be discharged home. Advised  to hold metformin for the next 48 hours due to IV contrast [DW]    Clinical Course User Index [DW] Zadie Rhine, MD  Medical Decision Making Amount and/or Complexity of Data Reviewed Labs:  Decision-making details documented in ED Course. Radiology: ordered.  Risk Prescription drug management.   This patient presents to the ED for concern of chest pain, this involves an extensive number of treatment options, and is a complaint that carries with it a high risk of complications and morbidity.  The differential diagnosis includes but is not limited to acute coronary syndrome, aortic dissection, pulmonary embolism, pericarditis, pneumothorax, pneumonia, myocarditis, pleurisy, esophageal rupture For neck pain-differential includes muscle strain, carotid dissection  Comorbidities that complicate the patient evaluation: Patient's presentation is complicated by their history of hypertension, cerebral aneurysm   Additional history obtained: Additional history obtained from family Records reviewed  outpatient records reviewed  Lab Tests: I Ordered, and personally interpreted labs.  The pertinent results include: Hyperglycemia  Imaging Studies ordered: I ordered imaging studies including CT scan angio head and neck and X-ray chest   I independently  visualized and interpreted imaging which showed no acute findings I agree with the radiologist interpretation   Medicines ordered and prescription drug management: I ordered medication including zofran  for nausea  Reevaluation of the patient after these medicines showed that the patient    improved   Reevaluation: After the interventions noted above, I reevaluated the patient and found that they have :improved  Complexity of problems addressed: Patient's presentation is most consistent with  acute presentation with potential threat to life or bodily function  Disposition: After consideration of the diagnostic results and the patient's response to treatment,  I feel that the patent would benefit from discharge   .           Final Clinical Impression(s) / ED Diagnoses Final diagnoses:  Precordial pain  Neck pain  Other fatigue    Rx / DC Orders ED Discharge Orders          Ordered    Ambulatory referral to Neurology       Comments: An appointment is requested in approximately: 2 weeks   07/08/22 0401              Zadie Rhine, MD 07/08/22 [phone]

## 2022-07-08 NOTE — Discharge Instructions (Addendum)
Please hold your metformin for 48 hours   Your caregiver has seen you today because you are having problems with feelings of weakness, dizziness, and/or fatigue. Weakness has many different causes, some of which are common and others are very rare. Your caregiver has considered some of the most common causes of weakness and feels it is safe for you to go home and be observed. Not every illness or injury can be identified during an emergency department visit, thus follow-up with your primary healthcare provider is important. Medical conditions can also worsen, so it is also important to return immediately as directed below, or if you have other serious concerns develop.  RETURN IMMEDIATELY IF  you develop new shortness of breath, chest pain, fever, have difficulty moving parts of your body (new weakness, numbness, or incoordination), sudden change in speech, vision, swallowing, or understanding, faint or develop new dizziness, severe headache, become poorly responsive or have an altered mental status compared to baseline for you, new rash, abdominal pain, or bloody stools,  Return sooner also if you develop new problems for which you have not talked to your caregiver but you feel may be emergency medical conditions.

## 2022-07-11 ENCOUNTER — Other Ambulatory Visit: Payer: Self-pay | Admitting: Family Medicine

## 2022-07-11 DIAGNOSIS — E118 Type 2 diabetes mellitus with unspecified complications: Secondary | ICD-10-CM

## 2022-07-11 NOTE — Progress Notes (Deleted)
[facility] [address], [address] [place], [place] [address] [phone]  Date:  07/12/2022   Name:  Sherry [name]   DOB:  1950-03-22   MRN:  [id number]  PCP:  Pearline Cables, MD    Chief Complaint: No chief complaint on file.   History of Present Illness:  Sherry [name] is a 72 y.o. very pleasant female patient who presents with the following:  Patient seen today for follow-up from recent ER visit History of diabetes, hypertension, peripheral neuropathy, TIA 2016, hyperlipidemia, intracranial vascular stenosis/cerebral aneurysm under 20-month surveillance  She contacted [place] on 5/29 with concern of chest pain, symptoms were enough to recommend an ER visit.  She was seen in the ER on 5/29-she was worked up for chest pain and released to home It looks like she is also complaining about shortness of breath, visual change, and being more forgetful  I saw her most recently in November-at that time diabetes was under reasonable control, A1c 7.4  Patient Active Problem List   Diagnosis Date Noted   Trochanteric bursitis, left hip 06/24/2021   Traumatic hematoma of left lower leg 09/26/2018   S/P total knee arthroplasty, left 03/29/2017   Spondylosis without myelopathy or radiculopathy, cervical region 03/29/2017   Bilateral carpal tunnel syndrome 03/29/2017   Acute asthma exacerbation 08/19/2016   Cervicalgia 03/23/2016   Vertigo 04/14/2015   Anxiety state 01/22/2015   HLD (hyperlipidemia) 01/22/2015   Type 2 diabetes mellitus with circulatory disorder (HCC) 01/22/2015   Intracranial vascular stenosis 01/22/2015   Aneurysm (HCC)    Headache    Essential hypertension 11/12/2014   Dependent edema 11/12/2014   Peripheral neuropathy 11/12/2014   Depression 11/12/2014   Facial droop    TIA (transient ischemic attack) 11/11/2014   Hyperthyroidism 08/06/2014   Meniere's disease 06/07/2014   Goiter 06/13/2013    Obesity, unspecified 06/13/2013    Past Medical History:  Diagnosis Date   Asthma    Cerebral aneurysm    being watched by Dr. Link Snuffer every 6 months   Complication of anesthesia HYPOTENSION INTRAOP W/ HYSTERECTOMY IN 1987--  DID OK W/ TOTAL KNEE IN 2008   Diabetes mellitus    DJD (degenerative joint disease)    Dyslipidemia    GERD (gastroesophageal reflux disease)    H/O hiatal hernia    History of CHF (congestive heart failure) 2010-  RESOLVED   History of peptic ulcer YRS AGO   Hypertension    Meniere's disease    Mitral valve prolapse occasional palpitations w/ chest discomfort   OA (osteoarthritis)    Rotator cuff tear, left    Stroke [facility])     Past Surgical History:  Procedure Laterality Date   BUNIONECTOMY  2007   LEFT FOOT   IR 3D INDEPENDENT WKST  01/06/2021   IR ANGIO INTRA EXTRACRAN SEL COM CAROTID INNOMINATE BILAT MOD SED  03/22/2019   IR ANGIO INTRA EXTRACRAN SEL COM CAROTID INNOMINATE BILAT MOD SED  01/06/2021   IR ANGIO VERTEBRAL SEL SUBCLAVIAN INNOMINATE UNI L MOD SED  03/22/2019   IR ANGIO VERTEBRAL SEL VERTEBRAL UNI R MOD SED  03/22/2019   IR ANGIO VERTEBRAL SEL VERTEBRAL UNI R MOD SED  01/06/2021   IR US GUIDE VASC ACCESS RIGHT  03/22/2019   IR US GUIDE VASC ACCESS RIGHT  01/06/2021   LEFT SHOULDER SURGERY  1997   ROTATOR CUFF REPAIR   REVERSE SHOULDER ARTHROPLASTY Right 07/30/2020  Procedure: REVERSE SHOULDER ARTHROPLASTY;  Surgeon: Bjorn Pippin, MD;  Location: [facility];  Service: Orthopedics;  Laterality: Right;   SHOULDER ARTHROSCOPY  09/20/2011   Procedure: ARTHROSCOPY SHOULDER;  Surgeon: Javier Docker, MD;  Location: [facility];  Service: Orthopedics;  Laterality: Left;  SAD AND MINI OPEN ROTATOR CUFF REPAIR     TOTAL KNEE ARTHROPLASTY  09-12-2006  [facility]   RIGHT KNEE   TOTAL KNEE ARTHROPLASTY Left 11/30/2017   Procedure: LEFT TOTAL KNEE REPLACEMENT;  Surgeon: Eldred Manges, MD;  Location: [facility];  Service: Orthopedics;  Laterality:  Left;   TUBAL LIGATION  1976   TYMPANOPLASTY  1990;   1980   VAGINAL HYSTERECTOMY  1987    Social History   Tobacco Use   Smoking status: Never    Passive exposure: Never   Smokeless tobacco: Never  Vaping Use   Vaping Use: Never used  Substance Use Topics   Alcohol use: No   Drug use: No    Family History  Problem Relation Age of Onset   Heart disease Mother        in her 59s, heart attack   Diabetes Mother    Kidney disease Mother        dialysis   Thyroid disease Mother    Aneurysm Mother    Ovarian cancer Mother    Breast cancer Mother    Thyroid disease Sister    Heart disease Sister    Lymphoma Father    Diabetes Father    Hypertension Father    Congestive Heart Failure Father    Thyroid disease Brother    Thyroid disease Paternal Grandmother    Heart attack Sister        at age 58   Stroke Sister    Hypertension Sister    Breast cancer Maternal Aunt    Breast cancer Maternal Grandmother    Breast cancer Maternal Aunt    Breast cancer Maternal Aunt    Other Brother        Car accident   Bipolar disorder Son    Other Son        GSW    Allergies  Allergen Reactions   Influenza Vaccines Anaphylaxis   Atorvastatin Other (See Comments)    Joint pain / myalgias   Demerol Other (See Comments)    SEIZURE   Nsaids Other (See Comments)    NAPROXEN, IBUPROFEN, SKELAXIN---  CAUSES PALPITATIONS   Pravastatin Other (See Comments)    Myalgias    Prednisone Other (See Comments)    Caused her glucose to go up to 500 and went to ED   Rosuvastatin Other (See Comments)    Joint stiffness and muscle pain   Roxicodone [Oxycodone] Itching and Other (See Comments)    Hallucinations and itching   Tramadol Other (See Comments)    Contraindicated with Victoza    Covid-19 (Mrna) Vaccine     Patient had anaphylactic reaction to flu vaccine   Codeine Itching   Mango Flavor [Flavoring Agent] Itching    Medication list has been reviewed and updated.  Current  Outpatient Medications on File Prior to Visit  Medication Sig Dispense Refill   albuterol (VENTOLIN HFA) 108 (90 Base) MCG/ACT inhaler Inhale 2 puffs into the lungs every 6 (six) hours as needed for wheezing or shortness of breath. 18 g 2   beclomethasone (QVAR REDIHALER) 80 MCG/ACT inhaler Inhale 1 puff into the lungs 2 (two) times daily. 1 each 6  Blood Glucose Monitoring Suppl (ONE TOUCH ULTRA 2) w/Device KIT Use to check glucose up to twice daily. E11.9 dx code 1 kit 0   cetirizine (ZYRTEC) 10 MG tablet Take 1 tablet (10 mg total) by mouth daily. 90 tablet 3   Cholecalciferol (VITAMIN D) 50 MCG (2000 UT) tablet Take 2,000 Units by mouth daily.     clopidogrel (PLAVIX) 75 MG tablet TAKE ONE TABLET BY MOUTH EVERY MORNING 90 tablet 3   empagliflozin (JARDIANCE) 10 MG TABS tablet Take 1 tablet (10 mg total) by mouth every morning. (Patient taking differently: Take 5 mg by mouth every morning.) 90 tablet 3   famotidine (PEPCID) 40 MG tablet Take 1 tablet (40 mg total) by mouth every morning. 90 tablet 1   FLOVENT HFA 220 MCG/ACT inhaler INHALE 1 PUFF BY MOUTH INTO LUNGS twice A DAY. MAY INCREASE TO TWO PUFFS AS NEEDED 12 g 5   fluticasone (FLONASE) 50 MCG/ACT nasal spray Place 2 sprays into both nostrils daily. 16 g 6   furosemide (LASIX) 20 MG tablet TAKE ONE TABLET BY MOUTH EVERY MORNING 90 tablet 1   gabapentin (NEURONTIN) 300 MG capsule TAKE TWO CAPSULES BY MOUTH EVERY MORNING and TAKE TWO CAPSULES BY MOUTH EVERYDAY AT BEDTIME 360 capsule 3   glucose blood (ONETOUCH VERIO) test strip 1 each by Other route daily. Use as instructed     ipratropium (ATROVENT) 0.03 % nasal spray Place 1 spray into both nostrils in the morning. 60 mL 0   linagliptin (TRADJENTA) 5 MG TABS tablet Take 0.5 tablets (2.5 mg total) by mouth every morning. 45 tablet 1   lisinopril (ZESTRIL) 20 MG tablet TAKE ONE TABLET BY MOUTH EVERY MORNING 90 tablet 1   metFORMIN (GLUCOPHAGE-XR) 750 MG 24 hr tablet TAKE ONE TABLET BY  MOUTH EVERY MORNING and TAKE ONE TABLET BY MOUTH EVERY EVENING 180 tablet 1   metoprolol tartrate (LOPRESSOR) 25 MG tablet TAKE 1/2 TABLET BY MOUTH EVERY MORNING and TAKE 1/2 TABLET BY MOUTH EVERY EVENING 90 tablet 1   Multiple Vitamins-Minerals (ADULT GUMMY PO) Take 2 capsules by mouth daily.     OneTouch Delica Lancets 33G MISC Use to check glucose up to twice daily. E11.9 dx code 200 each 3   ONETOUCH VERIO test strip USE TO CHECK BLOOD GLUCOSE UP TO TWICE DAILY 200 strip 6   Spacer/Aero-Holding Chambers DEVI Use with Flovent and albuterol inhalers 1 each 0   No current facility-administered medications on file prior to visit.    Review of Systems:  As per HPI- otherwise negative.   Physical Examination: There were no vitals filed for this visit. There were no vitals filed for this visit. There is no height or weight on file to calculate BMI. Ideal Body Weight:    GEN: no acute distress. HEENT: Atraumatic, Normocephalic.  Ears and Nose: No external deformity. CV: RRR, No M/G/R. No JVD. No thrill. No extra heart sounds. PULM: CTA B, no wheezes, crackles, rhonchi. No retractions. No resp. distress. No accessory muscle use. ABD: S, NT, ND, +BS. No rebound. No HSM. EXTR: No c/c/e PSYCH: Normally interactive. Conversant.    Assessment and Plan: ***  Signed Abbe Amsterdam, MD

## 2022-07-12 ENCOUNTER — Ambulatory Visit: Payer: Medicare Other | Admitting: Family Medicine

## 2022-08-02 ENCOUNTER — Telehealth: Payer: Self-pay

## 2022-08-02 ENCOUNTER — Other Ambulatory Visit (HOSPITAL_COMMUNITY): Payer: Self-pay

## 2022-08-02 NOTE — Telephone Encounter (Signed)
*  Primary  Temporary supply notice received for Qvar RediHaler 80MCG/ACT aerosol  PA submitted to [employer] via CMM for Qvar RediHaler 80MCG/ACT aerosol and is has been APPROVED from 08/02/2022-02/08/2023  Key: [id number]

## 2022-08-05 ENCOUNTER — Telehealth: Payer: Self-pay | Admitting: Family Medicine

## 2022-08-05 NOTE — Telephone Encounter (Signed)
ERROR

## 2022-08-05 NOTE — Telephone Encounter (Signed)
Copied from CRM [id number]. Topic: Medicare AWV >> Aug 05, 2022  1:31 PM Payton Doughty wrote: Reason for CRM: LM 08/05/2022 to schedule AWV   Verlee Rossetti; Care Guide Ambulatory Clinical Support [place] [facility] Direct Dial: [phone]

## 2022-08-09 ENCOUNTER — Other Ambulatory Visit: Payer: Self-pay | Admitting: Family Medicine

## 2022-08-09 DIAGNOSIS — E118 Type 2 diabetes mellitus with unspecified complications: Secondary | ICD-10-CM

## 2022-08-18 ENCOUNTER — Ambulatory Visit: Payer: Medicare HMO | Admitting: Neurology

## 2022-08-18 ENCOUNTER — Encounter: Payer: Self-pay | Admitting: Neurology

## 2022-08-18 ENCOUNTER — Ambulatory Visit (INDEPENDENT_AMBULATORY_CARE_PROVIDER_SITE_OTHER): Payer: Medicare HMO | Admitting: *Deleted

## 2022-08-18 VITALS — BP 130/74 | HR 72 | Ht 61.0 in | Wt 201.0 lb

## 2022-08-18 DIAGNOSIS — Z0000 Encounter for general adult medical examination without abnormal findings: Secondary | ICD-10-CM | POA: Diagnosis not present

## 2022-08-18 DIAGNOSIS — G43709 Chronic migraine without aura, not intractable, without status migrainosus: Secondary | ICD-10-CM

## 2022-08-18 MED ORDER — UBRELVY 50 MG PO TABS: ORAL_TABLET | ORAL | 11 refills | Status: DC

## 2022-08-18 MED ORDER — NORTRIPTYLINE HCL 10 MG PO CAPS
30.0000 mg | ORAL_CAPSULE | Freq: Every day | ORAL | 11 refills | Status: DC
Start: 2022-08-18 — End: 2022-11-03
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
## 2022-08-18 NOTE — Progress Notes (Signed)
Chief Complaint  Patient presents with   New Patient (Initial Visit)    Rm 15, alone, neck pain, fatigue, admits to confusion too.      ASSESSMENT AND PLAN  Sherry [name] is a 72 y.o. female   Chronic migraine Anxiety  Nortriptyline 10 mg titrating to 30 mg every night as preventive medications  Ubrelvy as needed  ESR C-reactive protein  Return To Clinic With NP In 6 Months   DIAGNOSTIC DATA (LABS, IMAGING, TESTING) - I reviewed patient records, labs, notes, testing and imaging myself where available.   MEDICAL HISTORY:  Sherry [name] is a 72 year old female, seen in request by her primary care physician Dr.   Abbe [name] for evaluation of headache, initial evaluation was on August 18, 2022  I reviewed and summarized the referring note. PMHX Dm HTN Stroke in 2006, with left hemiparesis, Left bunionectomy Right reverse shoulder replacement Brain aneurysm Hx of bilateral knee replacement   She underwent a lot of stress, lost a lot of family members, began to have frequent headache over the past few months, couple times each months, sometimes proceeding with seeing sparkling her eyes, left visual field blurry, then holoacranial, occipital area pressure headache lasting for few hours, often improved by sleep,  Reported long history of headache, when she was younger in elementary school, could remember her mother tied a scarf around her head when she complains of a headache  She complains of frequent headaches throughout her adult life,  Presented to emergency room on Jul 08, 2022 for headache, chest pain, troponin was negative,  CT angiogram head and neck no significant large vessel disease, no evidence of aneurysm, previously described in 2022 four-vessel angiogram, 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  PHYSICAL EXAMNIATION:  Gen: NAD, conversant, well nourised, well groomed                     Cardiovascular: Regular rate rhythm, no peripheral edema, warm, nontender. Eyes: Conjunctivae clear without exudates or hemorrhage Neck: Supple, no carotid bruits. Pulmonary: Clear to auscultation bilaterally   NEUROLOGICAL EXAM:  MENTAL STATUS: Speech/cognition: Awake, alert, oriented to history taking and casual conversation CRANIAL NERVES: CN II: Visual fields are full to confrontation. Pupils are round equal and briskly reactive to light. CN III, IV, VI: extraocular movement are normal. No ptosis. CN V: Facial sensation is intact to light touch CN VII: Face is symmetric with normal eye closure  CN VIII: Hearing is normal to causal conversation. CN IX, X: Phonation is normal. CN XI: Head turning and shoulder shrug are intact  MOTOR: There is no pronator drift of out-stretched arms. Muscle bulk and tone are normal. Muscle strength is normal.  REFLEXES: Reflexes are 2+ and symmetric at the biceps, triceps, knees, and ankles. Plantar responses are flexor.  SENSORY: Intact to light touch, pinprick and vibratory sensation are intact in fingers and toes.  COORDINATION: There is no trunk or limb dysmetria noted.  GAIT/STANCE: Posture is normal. Gait is steady with normal steps, base, arm swing, and turning. Heel and toe walking are normal. Tandem gait is normal.  Romberg is absent.  REVIEW OF  SYSTEMS:  Full 14 system review of systems performed and notable only for as above All other review of systems were negative.   ALLERGIES: Allergies  Allergen Reactions   Influenza Vaccines Anaphylaxis   Atorvastatin Other (See Comments)    Joint pain / myalgias   Demerol Other (See Comments)    SEIZURE   Nsaids Other (See Comments)    NAPROXEN,  IBUPROFEN, SKELAXIN---  CAUSES PALPITATIONS   Pravastatin Other (See Comments)    Myalgias    Prednisone Other (See Comments)    Caused her glucose to go up to 500 and went to ED   Rosuvastatin Other (See Comments)    Joint stiffness and muscle pain   Roxicodone [Oxycodone] Itching and Other (See Comments)    Hallucinations and itching   Tramadol Other (See Comments)    Contraindicated with Victoza    Covid-19 (Mrna) Vaccine     Patient had anaphylactic reaction to flu vaccine   Codeine Itching   Mango Flavor [Flavoring Agent] Itching    HOME MEDICATIONS: Current Outpatient Medications  Medication Sig Dispense Refill   albuterol (VENTOLIN HFA) 108 (90 Base) MCG/ACT inhaler Inhale 2 puffs into the lungs every 6 (six) hours as needed for wheezing or shortness of breath. 18 g 2   Blood Glucose Monitoring Suppl (ONE TOUCH ULTRA 2) w/Device KIT Use to check glucose up to twice daily. E11.9 dx code 1 kit 0   cetirizine (ZYRTEC) 10 MG tablet Take 1 tablet (10 mg total) by mouth daily. 90 tablet 3   Cholecalciferol (VITAMIN D) 50 MCG (2000 UT) tablet Take 2,000 Units by mouth daily.     clopidogrel (PLAVIX) 75 MG tablet Take 1 tablet (75 mg total) by mouth every morning. Overdue for appt 30 tablet 0   empagliflozin (JARDIANCE) 10 MG TABS tablet Take 1 tablet (10 mg total) by mouth every morning. (Patient taking differently: Take 5 mg by mouth every morning.) 90 tablet 3   famotidine (PEPCID) 40 MG tablet Take 1 tablet (40 mg total) by mouth every morning. Overdue for appt 30 tablet 0   FLOVENT HFA 220 MCG/ACT inhaler INHALE 1 PUFF BY MOUTH INTO LUNGS twice A DAY. MAY INCREASE TO TWO PUFFS AS NEEDED 12 g 5   fluticasone (FLONASE) 50 MCG/ACT nasal spray Place 2 sprays into both nostrils daily. 16 g 6   furosemide (LASIX) 20 MG tablet TAKE ONE TABLET BY MOUTH EVERY MORNING 90 tablet 1   gabapentin (NEURONTIN) 300 MG capsule Take 2 capsules (600 mg total) by mouth 2 (two) times daily. Overdue for  appt 120 capsule 0   glucose blood (ONETOUCH VERIO) test strip 1 each by Other route daily. Use as instructed     ipratropium (ATROVENT) 0.03 % nasal spray Place 1 spray into both nostrils in the morning. 60 mL 0   linagliptin (TRADJENTA) 5 MG TABS tablet Take 0.5 tablets (2.5 mg total) by mouth every morning. Overdue for appt 15 tablet 0   lisinopril (ZESTRIL) 20 MG tablet TAKE ONE TABLET BY MOUTH EVERY MORNING 90 tablet 1   metFORMIN (GLUCOPHAGE-XR) 750 MG 24 hr tablet TAKE ONE TABLET BY MOUTH EVERY MORNING and TAKE ONE TABLET BY MOUTH EVERY EVENING 180 tablet 1   metoprolol tartrate (LOPRESSOR) 25 MG tablet TAKE 1/2 TABLET BY MOUTH EVERY MORNING and TAKE 1/2 TABLET BY MOUTH EVERY EVENING 90 tablet 1   Multiple Vitamins-Minerals (ADULT GUMMY PO) Take 2 capsules by mouth daily.     OneTouch Delica Lancets 33G  MISC Use to check glucose up to twice daily. E11.9 dx code 200 each 3   ONETOUCH VERIO test strip USE TO CHECK BLOOD GLUCOSE UP TO TWICE DAILY 200 strip 6   Spacer/Aero-Holding Chambers DEVI Use with Flovent and albuterol inhalers 1 each 0   beclomethasone (QVAR REDIHALER) 80 MCG/ACT inhaler Inhale 1 puff into the lungs 2 (two) times daily. (Patient not taking: Reported on 08/18/2022) 1 each 6   No current facility-administered medications for this visit.    PAST MEDICAL HISTORY: Past Medical History:  Diagnosis Date   Allergy 1970   Demerol, FLu vaccine, NSAIDS, Codiene   Anxiety 2002   Ongoing   Asthma    Cataract    Small ones found by opthamolgist   Cerebral aneurysm    being watched by Dr. Link Snuffer every 6 months   CHF (congestive heart failure) (HCC)    Resolved   Complication of anesthesia HYPOTENSION INTRAOP W/ HYSTERECTOMY IN 1987--  DID OK W/ TOTAL KNEE IN 2008   COPD (chronic obstructive pulmonary disease) (HCC) 1970   Bronchitits   Depression 1970   Diabetes mellitus    DJD (degenerative joint disease)    Dyslipidemia    GERD (gastroesophageal reflux disease)     H/O hiatal hernia    History of CHF (congestive heart failure) 2010-  RESOLVED   History of peptic ulcer YRS AGO   Hypertension    Meniere's disease    Mitral valve prolapse occasional palpitations w/ chest discomfort   Neuromuscular disorder (HCC) 2002   Peripheral neuropathy   OA (osteoarthritis)    Rotator cuff tear, left    Stroke (HCC)    Thyroid disease 2010   Multinodal goiter    PAST SURGICAL HISTORY: Past Surgical History:  Procedure Laterality Date   BUNIONECTOMY  02/08/2005   LEFT FOOT   FRACTURE SURGERY  07/30/2019   (R) Reverse Shoulder Replacement   IR 3D INDEPENDENT WKST  01/06/2021   IR ANGIO INTRA EXTRACRAN SEL COM CAROTID INNOMINATE BILAT MOD SED  03/22/2019   IR ANGIO INTRA EXTRACRAN SEL COM CAROTID INNOMINATE BILAT MOD SED  01/06/2021   IR ANGIO VERTEBRAL SEL SUBCLAVIAN INNOMINATE UNI L MOD SED  03/22/2019   IR ANGIO VERTEBRAL SEL VERTEBRAL UNI R MOD SED  03/22/2019   IR ANGIO VERTEBRAL SEL VERTEBRAL UNI R MOD SED  01/06/2021   IR US GUIDE VASC ACCESS RIGHT  03/22/2019   IR US GUIDE VASC ACCESS RIGHT  01/06/2021   JOINT REPLACEMENT  2008, 2011,2021   Total knee replacement, '08 (R), '11 (L)   LEFT SHOULDER SURGERY  02/09/1995   ROTATOR CUFF REPAIR   REVERSE SHOULDER ARTHROPLASTY Right 07/30/2020   Procedure: REVERSE SHOULDER ARTHROPLASTY;  Surgeon: Bjorn Pippin, MD;  Location: [facility];  Service: Orthopedics;  Laterality: Right;   SHOULDER ARTHROSCOPY  09/20/2011   Procedure: ARTHROSCOPY SHOULDER;  Surgeon: Javier Docker, MD;  Location: [facility];  Service: Orthopedics;  Laterality: Left;  SAD AND MINI OPEN ROTATOR CUFF REPAIR     TOTAL KNEE ARTHROPLASTY  09-12-2006  [facility]   RIGHT KNEE   TOTAL KNEE ARTHROPLASTY Left 11/30/2017   Procedure: LEFT TOTAL KNEE REPLACEMENT;  Surgeon: Eldred Manges, MD;  Location: [facility];  Service: Orthopedics;  Laterality: Left;   TUBAL LIGATION  02/08/1974   TYMPANOPLASTY  1990;   1980   VAGINAL  HYSTERECTOMY  02/08/1985    FAMILY HISTORY: Family History  Problem Relation Age of Onset  Heart disease Mother        in her 51s, heart attack   Diabetes Mother    Kidney disease Mother        dialysis   Thyroid disease Mother    Aneurysm Mother    Ovarian cancer Mother    Breast cancer Mother    Arthritis Mother    Cancer Mother    Depression Mother    Stroke Mother    Thyroid disease Sister    Heart disease Sister    Diabetes Sister    Hyperlipidemia Sister    Hypertension Sister    Lymphoma Father    Diabetes Father    Hypertension Father    Congestive Heart Failure Father    Arthritis Father    Cancer Father    Vision loss Father    Thyroid disease Brother    Hyperlipidemia Brother    Hypertension Brother    Obesity Brother    Thyroid disease Paternal Grandmother    Arthritis Paternal Grandmother    Heart attack Sister        at age 54   Stroke Sister    Hypertension Sister    Arthritis Sister    Asthma Sister    Cancer Sister    Diabetes Sister    Hearing loss Sister    Hyperlipidemia Sister    Obesity Sister    Vision loss Sister    Breast cancer Maternal Aunt    Breast cancer Maternal Grandmother    Anxiety disorder Maternal Grandmother    Breast cancer Maternal Aunt    Breast cancer Maternal Aunt    COPD Paternal Grandfather    Early death Paternal Grandfather    Other Brother        Set designer accident   Drug abuse Brother    Bipolar disorder Son    Other Son        GSW   Early death Son    ADD / ADHD Daughter    Diabetes Daughter    Anxiety disorder Daughter    Diabetes Daughter    Obesity Daughter    Early death Brother     SOCIAL HISTORY: Social History   Socioeconomic History   Marital status: Widowed    Spouse name: Not on file   Number of children: 4   Years of education: 16   Highest education level: Bachelor's degree (e.g., BA, AB, BS)  Occupational History   Occupation: LPN - retired  Tobacco Use   Smoking status: Never     Passive exposure: Never   Smokeless tobacco: Never  Vaping Use   Vaping Use: Never used  Substance and Sexual Activity   Alcohol use: No   Drug use: No   Sexual activity: Not Currently    Birth control/protection: Post-menopausal  Other Topics Concern   Not on file  Social History Narrative   Right handed   Caffeine- 1 cup daily   Lives at home alone   Social Determinants of Health   Financial Resource Strain: Low Risk  (08/17/2022)   Overall Financial Resource Strain (CARDIA)    Difficulty of Paying Living Expenses: Not hard at all  Food Insecurity: No Food Insecurity (08/17/2022)   Hunger Vital Sign    Worried About Running Out of Food in the Last Year: Never true    Ran Out of Food in the Last Year: Never true  Transportation Needs: No Transportation Needs (08/17/2022)   PRAPARE - Transportation    Lack of Transportation (  Medical): No    Lack of Transportation (Non-Medical): No  Physical Activity: Inactive (08/17/2022)   Exercise Vital Sign    Days of Exercise per Week: 0 days    Minutes of Exercise per Session: 0 min  Stress: Stress Concern Present (08/17/2022)   [employer] of Occupational Health - Occupational Stress Questionnaire    Feeling of Stress : To some extent  Social Connections: Unknown (08/17/2022)   Social Connection and Isolation Panel [NHANES]    Frequency of Communication with Friends and Family: More than three times a week    Frequency of Social Gatherings with Friends and Family: Once a week    Attends Religious Services: Not on Marketing executive or Organizations: No    Attends Banker Meetings: Patient declined    Marital Status: Widowed  Intimate Partner Violence: Not At Risk (08/18/2022)   Humiliation, Afraid, Rape, and Kick questionnaire    Fear of Current or Ex-Partner: No    Emotionally Abused: No    Physically Abused: No    Sexually Abused: No      Levert Feinstein, M.D. Ph.D.  [facility] [address] [place], [place] [address] Ph: [phone] Fax: [phone]  CC:  Zadie Rhine, MD [address] [place],  [place] [address]  Copland, Gwenlyn Found, MD

## 2022-08-18 NOTE — Patient Instructions (Signed)
[name] , Thank you for taking time to come for your Medicare Wellness Visit. I appreciate your ongoing commitment to your health goals. Please review the following plan we discussed and let me know if I can assist you in the future.     This is a list of the screening recommended for you and due dates:  Health Maintenance  Topic Date Due   Zoster (Shingles) Vaccine (1 of 2) Never done   DTaP/Tdap/Td vaccine (2 - Td or Tdap) 09/23/2019   Hemoglobin A1C  09/23/2022   Eye exam for diabetics  11/07/2022   Yearly kidney health urinalysis for diabetes  12/29/2022   Complete foot exam   12/29/2022   Yearly kidney function blood test for diabetes  07/07/2023   Medicare Annual Wellness Visit  08/18/2023   Mammogram  12/01/2023   Colon Cancer Screening  03/05/2026   Pneumonia Vaccine  Completed   DEXA scan (bone density measurement)  Completed   Hepatitis C Screening  Completed   HPV Vaccine  Aged Out   Flu Shot  Discontinued   COVID-19 Vaccine  Discontinued    Next appointment: Follow up in one year for your annual wellness visit.   Preventive Care 70 Years and Older, Female Preventive care refers to lifestyle choices and visits with your health care provider that can promote health and wellness. What does preventive care include? A yearly physical exam. This is also called an annual well check. Dental exams once or twice a year. Routine eye exams. Ask your health care provider how often you should have your eyes checked. Personal lifestyle choices, including: Daily care of your teeth and gums. Regular physical activity. Eating a healthy diet. Avoiding tobacco and drug use. Limiting alcohol use. Practicing safe sex. Taking low-dose aspirin every day. Taking vitamin and mineral supplements as recommended by your health care provider. What happens during an annual well check? The services and screenings done by your health care provider during your annual well check will  depend on your age, overall health, lifestyle risk factors, and family history of disease. Counseling  Your health care provider may ask you questions about your: Alcohol use. Tobacco use. Drug use. Emotional well-being. Home and relationship well-being. Sexual activity. Eating habits. History of falls. Memory and ability to understand (cognition). Work and work Astronomer. Reproductive health. Screening  You may have the following tests or measurements: Height, weight, and BMI. Blood pressure. Lipid and cholesterol levels. These may be checked every 5 years, or more frequently if you are over 40 years old. Skin check. Lung cancer screening. You may have this screening every year starting at age 77 if you have a 30-pack-year history of smoking and currently smoke or have quit within the past 15 years. Fecal occult blood test (FOBT) of the stool. You may have this test every year starting at age 29. Flexible sigmoidoscopy or colonoscopy. You may have a sigmoidoscopy every 5 years or a colonoscopy every 10 years starting at age 86. Hepatitis C blood test. Hepatitis B blood test. Sexually transmitted disease (STD) testing. Diabetes screening. This is done by checking your blood sugar (glucose) after you have not eaten for a while (fasting). You may have this done every 1-3 years. Bone density scan. This is done to screen for osteoporosis. You may have this done starting at age 77. Mammogram. This may be done every 1-2 years. Talk to your health care provider about how often you should have regular mammograms. Talk with your health  care provider about your test results, treatment options, and if necessary, the need for more tests. Vaccines  Your health care provider may recommend certain vaccines, such as: Influenza vaccine. This is recommended every year. Tetanus, diphtheria, and acellular pertussis (Tdap, Td) vaccine. You may need a Td booster every 10 years. Zoster vaccine. You may  need this after age 109. Pneumococcal 13-valent conjugate (PCV13) vaccine. One dose is recommended after age 79. Pneumococcal polysaccharide (PPSV23) vaccine. One dose is recommended after age 70. Talk to your health care provider about which screenings and vaccines you need and how often you need them. This information is not intended to replace advice given to you by your health care provider. Make sure you discuss any questions you have with your health care provider. Document Released: 02/21/2015 Document Revised: 10/15/2015 Document Reviewed: 11/26/2014 Elsevier Interactive Patient Education  2017 [employer].  Fall Prevention in the Home Falls can cause injuries. They can happen to people of all ages. There are many things you can do to make your home safe and to help prevent falls. What can I do on the outside of my home? Regularly fix the edges of walkways and driveways and fix any cracks. Remove anything that might make you trip as you walk through a door, such as a raised step or threshold. Trim any bushes or trees on the path to your home. Use bright outdoor lighting. Clear any walking paths of anything that might make someone trip, such as rocks or tools. Regularly check to see if handrails are loose or broken. Make sure that both sides of any steps have handrails. Any raised decks and porches should have guardrails on the edges. Have any leaves, snow, or ice cleared regularly. Use sand or salt on walking paths during winter. Clean up any spills in your garage right away. This includes oil or grease spills. What can I do in the bathroom? Use night lights. Install grab bars by the toilet and in the tub and shower. Do not use towel bars as grab bars. Use non-skid mats or decals in the tub or shower. If you need to sit down in the shower, use a plastic, non-slip stool. Keep the floor dry. Clean up any water that spills on the floor as soon as it happens. Remove soap buildup in  the tub or shower regularly. Attach bath mats securely with double-sided non-slip rug tape. Do not have throw rugs and other things on the floor that can make you trip. What can I do in the bedroom? Use night lights. Make sure that you have a light by your bed that is easy to reach. Do not use any sheets or blankets that are too big for your bed. They should not hang down onto the floor. Have a firm chair that has side arms. You can use this for support while you get dressed. Do not have throw rugs and other things on the floor that can make you trip. What can I do in the kitchen? Clean up any spills right away. Avoid walking on wet floors. Keep items that you use a lot in easy-to-reach places. If you need to reach something above you, use a strong step stool that has a grab bar. Keep electrical cords out of the way. Do not use floor polish or wax that makes floors slippery. If you must use wax, use non-skid floor wax. Do not have throw rugs and other things on the floor that can make you trip. What can  I do with my stairs? Do not leave any items on the stairs. Make sure that there are handrails on both sides of the stairs and use them. Fix handrails that are broken or loose. Make sure that handrails are as long as the stairways. Check any carpeting to make sure that it is firmly attached to the stairs. Fix any carpet that is loose or worn. Avoid having throw rugs at the top or bottom of the stairs. If you do have throw rugs, attach them to the floor with carpet tape. Make sure that you have a light switch at the top of the stairs and the bottom of the stairs. If you do not have them, ask someone to add them for you. What else can I do to help prevent falls? Wear shoes that: Do not have high heels. Have rubber bottoms. Are comfortable and fit you well. Are closed at the toe. Do not wear sandals. If you use a stepladder: Make sure that it is fully opened. Do not climb a closed  stepladder. Make sure that both sides of the stepladder are locked into place. Ask someone to hold it for you, if possible. Clearly mark and make sure that you can see: Any grab bars or handrails. First and last steps. Where the edge of each step is. Use tools that help you move around (mobility aids) if they are needed. These include: Canes. Walkers. Scooters. Crutches. Turn on the lights when you go into a dark area. Replace any light bulbs as soon as they burn out. Set up your furniture so you have a clear path. Avoid moving your furniture around. If any of your floors are uneven, fix them. If there are any pets around you, be aware of where they are. Review your medicines with your doctor. Some medicines can make you feel dizzy. This can increase your chance of falling. Ask your doctor what other things that you can do to help prevent falls. This information is not intended to replace advice given to you by your health care provider. Make sure you discuss any questions you have with your health care provider. Document Released: 11/21/2008 Document Revised: 07/03/2015 Document Reviewed: 03/01/2014 Elsevier Interactive Patient Education  2017 [employer].

## 2022-08-19 LAB — C-REACTIVE PROTEIN: CRP: 7 mg/L (ref 0–10)

## 2022-08-19 LAB — SEDIMENTATION RATE: Sed Rate: 5 mm/hr (ref 0–40)

## 2022-09-06 ENCOUNTER — Encounter: Payer: Self-pay | Admitting: *Deleted

## 2022-09-06 ENCOUNTER — Other Ambulatory Visit: Payer: Self-pay | Admitting: Family Medicine

## 2022-09-06 DIAGNOSIS — I10 Essential (primary) hypertension: Secondary | ICD-10-CM

## 2022-09-15 ENCOUNTER — Other Ambulatory Visit: Payer: Self-pay | Admitting: Family Medicine

## 2022-09-15 DIAGNOSIS — E118 Type 2 diabetes mellitus with unspecified complications: Secondary | ICD-10-CM

## 2022-09-15 DIAGNOSIS — R0982 Postnasal drip: Secondary | ICD-10-CM

## 2022-09-15 DIAGNOSIS — I10 Essential (primary) hypertension: Secondary | ICD-10-CM

## 2022-09-21 ENCOUNTER — Other Ambulatory Visit: Payer: Self-pay | Admitting: Family Medicine

## 2022-10-04 ENCOUNTER — Encounter: Payer: Self-pay | Admitting: *Deleted

## 2022-10-05 ENCOUNTER — Telehealth: Payer: Self-pay

## 2022-10-05 ENCOUNTER — Other Ambulatory Visit (HOSPITAL_COMMUNITY): Payer: Self-pay

## 2022-10-05 ENCOUNTER — Other Ambulatory Visit: Payer: Self-pay | Admitting: Family Medicine

## 2022-10-05 DIAGNOSIS — J309 Allergic rhinitis, unspecified: Secondary | ICD-10-CM

## 2022-10-05 DIAGNOSIS — E118 Type 2 diabetes mellitus with unspecified complications: Secondary | ICD-10-CM

## 2022-10-05 NOTE — Telephone Encounter (Signed)
I received a PA request via CMM for Ubrelvy-Insurance would like to know if the PT has tried and failed or has a contraindication to Triptans-    Please advise-

## 2022-10-05 NOTE — Telephone Encounter (Signed)
Dr. Terrace Arabia,  I do not see that this pt has tried and failed triptans or that she has a contraindication to them. Please advise.  Thanks,  Production assistant, radio

## 2022-10-05 NOTE — Telephone Encounter (Signed)
Please review patient's chart, past medical history of stroke, multiple vascular risk factors, age, brain aneurysm, not a candidate for triptan treatment   PMHX Dm HTN Stroke in 2006, with left hemiparesis, Left bunionectomy Right reverse shoulder replacement Brain aneurysm Hx of bilateral knee replacement

## 2022-10-06 NOTE — Telephone Encounter (Signed)
Pharmacy Patient Advocate Encounter   Received notification from Physician's Office that prior authorization for Ubrelvy 50MG  tablets is required/requested.   Insurance verification completed.   The patient is insured through [facility] .   Per test claim: PA required; PA submitted to [facility] via CoverMyMeds Key/confirmation #/[id number] Status is pending

## 2022-10-08 NOTE — Telephone Encounter (Signed)
Pharmacy Patient Advocate Encounter  Received notification from [facility] Medicare that Prior Authorization for Ubrelvy 50MG  tablets has been DENIED.  Full denial letter will be uploaded to the media tab. See denial reason below.  Your plan does not allow coverage of this medication based on your prescriber answering No to the following question(s):  Has the patient experienced an inadequate treatment response, intolerance, or does the patient have a contraindication to at least ONE triptan 5-HY1 receptor agonist?  PA #/Case ID/Reference #: [id number]  Please be advised we currently do not have a Pharmacist to review denials, therefore you will need to process appeals accordingly as needed. Thanks for your support at this time. Contact for appeals are as follows: Phone: [phone], Fax: [phone]   Last day to appeal is 60 calendar days from date of this notice (10-06-2022)

## 2022-10-11 ENCOUNTER — Other Ambulatory Visit: Payer: Self-pay | Admitting: Family Medicine

## 2022-10-11 DIAGNOSIS — J309 Allergic rhinitis, unspecified: Secondary | ICD-10-CM

## 2022-10-12 NOTE — Telephone Encounter (Signed)
 Please review patient's chart, past medical history of stroke, multiple vascular risk factors, age, brain aneurysm, not a candidate for triptan treatment   PMHX Dm HTN Stroke in 2006, with left hemiparesis, Left bunionectomy Right reverse shoulder replacement Brain aneurysm Hx of bilateral knee replacement

## 2022-10-17 ENCOUNTER — Encounter: Payer: Self-pay | Admitting: Family Medicine

## 2022-10-17 DIAGNOSIS — T466X5A Adverse effect of antihyperlipidemic and antiarteriosclerotic drugs, initial encounter: Secondary | ICD-10-CM | POA: Insufficient documentation

## 2022-10-17 DIAGNOSIS — G720 Drug-induced myopathy: Secondary | ICD-10-CM | POA: Insufficient documentation

## 2022-10-27 ENCOUNTER — Other Ambulatory Visit (HOSPITAL_BASED_OUTPATIENT_CLINIC_OR_DEPARTMENT_OTHER): Payer: Self-pay

## 2022-10-27 ENCOUNTER — Emergency Department (HOSPITAL_BASED_OUTPATIENT_CLINIC_OR_DEPARTMENT_OTHER): Payer: Medicare HMO

## 2022-10-27 ENCOUNTER — Emergency Department (HOSPITAL_BASED_OUTPATIENT_CLINIC_OR_DEPARTMENT_OTHER)
Admission: EM | Admit: 2022-10-27 | Discharge: 2022-10-27 | Disposition: A | Discharge status: Home / Self Care | Payer: Medicare HMO | Attending: Emergency Medicine | Admitting: Emergency Medicine

## 2022-10-27 ENCOUNTER — Ambulatory Visit (INDEPENDENT_AMBULATORY_CARE_PROVIDER_SITE_OTHER): Payer: Medicare HMO | Admitting: Family Medicine

## 2022-10-27 ENCOUNTER — Encounter (HOSPITAL_BASED_OUTPATIENT_CLINIC_OR_DEPARTMENT_OTHER): Payer: Self-pay | Admitting: Emergency Medicine

## 2022-10-27 ENCOUNTER — Other Ambulatory Visit: Payer: Self-pay

## 2022-10-27 VITALS — BP 120/78 | HR 70 | Temp 97.8°F | Resp 18 | Ht 61.0 in | Wt 199.0 lb

## 2022-10-27 DIAGNOSIS — J45909 Unspecified asthma, uncomplicated: Secondary | ICD-10-CM | POA: Insufficient documentation

## 2022-10-27 DIAGNOSIS — E118 Type 2 diabetes mellitus with unspecified complications: Secondary | ICD-10-CM

## 2022-10-27 DIAGNOSIS — I509 Heart failure, unspecified: Secondary | ICD-10-CM | POA: Insufficient documentation

## 2022-10-27 DIAGNOSIS — R519 Headache, unspecified: Secondary | ICD-10-CM

## 2022-10-27 DIAGNOSIS — Z7984 Long term (current) use of oral hypoglycemic drugs: Secondary | ICD-10-CM

## 2022-10-27 DIAGNOSIS — J449 Chronic obstructive pulmonary disease, unspecified: Secondary | ICD-10-CM | POA: Insufficient documentation

## 2022-10-27 DIAGNOSIS — Z7901 Long term (current) use of anticoagulants: Secondary | ICD-10-CM | POA: Insufficient documentation

## 2022-10-27 DIAGNOSIS — I110 Hypertensive heart disease with heart failure: Secondary | ICD-10-CM | POA: Diagnosis not present

## 2022-10-27 LAB — BASIC METABOLIC PANEL WITH GFR
Anion gap: 13 (ref 5–15)
BUN: 14 mg/dL (ref 8–23)
CO2: 28 mmol/L (ref 22–32)
Calcium: 9.7 mg/dL (ref 8.9–10.3)
Chloride: 99 mmol/L (ref 98–111)
Creatinine, Ser: 0.7 mg/dL (ref 0.44–1.00)
GFR, Estimated: >60 mL/min (ref >60)
Glucose, Bld: 138 mg/dL — ABNORMAL HIGH (ref 70–99)
Potassium: 3.7 mmol/L (ref 3.5–5.1)
Sodium: 140 mmol/L (ref 135–145)

## 2022-10-27 MED ORDER — ACETAMINOPHEN 500 MG PO TABS
1000.0000 mg | ORAL_TABLET | Freq: Once | ORAL | Status: AC
  Administered 2022-10-27: 1000 mg via ORAL
  Filled 2022-10-27: qty 2

## 2022-10-27 MED ORDER — LINAGLIPTIN 5 MG PO TABS
5.0000 mg | ORAL_TABLET | Freq: Every day | ORAL | 3 refills | Status: DC
Start: 2022-10-27 — End: 2022-11-24
  Filled 2022-10-27: qty 90, 90d supply, fill #0

## 2022-10-27 MED ORDER — CLOPIDOGREL BISULFATE 75 MG PO TABS
75.0000 mg | ORAL_TABLET | Freq: Every morning | ORAL | 3 refills | Status: DC
  Filled 2022-10-27: qty 90, 90d supply, fill #0

## 2022-10-27 MED ORDER — LACTATED RINGERS IV BOLUS
1000.0000 mL | Freq: Once | INTRAVENOUS | Status: AC
  Administered 2022-10-27: 1000 mL via INTRAVENOUS

## 2022-10-27 MED ORDER — ONDANSETRON HCL 4 MG/2ML IJ SOLN
4.0000 mg | Freq: Once | INTRAMUSCULAR | Status: AC
  Administered 2022-10-27: 4 mg via INTRAVENOUS
  Filled 2022-10-27: qty 2

## 2022-10-27 MED ORDER — IOHEXOL 350 MG/ML SOLN
100.0000 mL | Freq: Once | INTRAVENOUS | Status: AC | PRN
  Administered 2022-10-27: 75 mL via INTRAVENOUS

## 2022-10-27 MED ORDER — EMPAGLIFLOZIN 10 MG PO TABS
10.0000 mg | ORAL_TABLET | Freq: Every morning | ORAL | 3 refills | Status: DC
Start: 2022-10-27 — End: 2022-11-24
  Filled 2022-10-27: qty 90, 90d supply, fill #0

## 2022-10-27 NOTE — ED Triage Notes (Signed)
Pt to ER with c/o "throbbing" headache that started around 3AM.  Pt states recent diagnosis of migraines.  States she took the medications with no relief.  Pt denies n/v, states sensitivity to lights, "maybe a little" dizziness.

## 2022-10-27 NOTE — Patient Instructions (Addendum)
Please schedule to see me again as soon as you can to check on your diabetes and routine labs We will have the ER check you out for your severe headache  I wonder if working night shifts is too hard at this point in your life?  I would encourage you to try and change to a day shift position if you can

## 2022-10-27 NOTE — Discharge Instructions (Signed)
You were seen in the emergency department for headache.  You had a CAT scan of the blood vessels which showed some progressive narrowing.  Your MRI did not show any signs of stroke.  Will be important for you to schedule follow-up appointment with your neurologist for further evaluation.  Return to the emergency department if any worsening or concerning symptoms

## 2022-10-27 NOTE — ED Provider Notes (Addendum)
[place] [facility] Provider Note   CSN: [id number] Arrival date & time: 10/27/22  1117     History  Chief Complaint  Patient presents with   Headache    Sherry [name] is a 72 y.o. female with PMH as listed below who presents with c/o "throbbing" right-sided headache that started around 3AM. Came on gradually and progressively worsened. Pt states recent diagnosis of migraines, seen by [facility]. 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  States she took [place] when she got home from work with no relief.  Pt denies n/v, states sensitivity to lights, "maybe a little" dizziness. Rates headache a 3/10 right now but earlier was a 6/10. States it was the worst headache of her life. Denies recent f/c, illnesses. Pain does radiate down the back of her neck. No new numbness/tingling, asymmetric weakness, slurred speech, facial droop. Does not take a blood thinner.   Per chart review she has a diagnosis of intracranial vascular stenosis/cerebral aneurysm but most recent CTA from 06/2022 showed no aneurysm.    Past Medical History:  Diagnosis Date   Allergy 1970   Demerol, FLu vaccine, NSAIDS, Codiene   Anxiety 2002   Ongoing   Asthma    Cataract    Small ones found by opthamolgist   Cerebral aneurysm    being watched by Dr. Link Snuffer every 6 months   CHF (congestive heart failure) (HCC)    Resolved   Complication of anesthesia HYPOTENSION INTRAOP W/ HYSTERECTOMY IN 1987--  DID OK W/ TOTAL KNEE IN 2008   COPD (chronic obstructive pulmonary disease) (HCC) 1970   Bronchitits   Depression 1970   Diabetes mellitus    DJD (degenerative joint disease)    Dyslipidemia    GERD (gastroesophageal reflux disease)    H/O hiatal hernia    History of CHF (congestive heart failure) 2010-  RESOLVED   History of peptic ulcer YRS AGO   Hypertension    Meniere's disease    Mitral valve prolapse occasional palpitations w/ chest discomfort   Neuromuscular  disorder (HCC) 2002   Peripheral neuropathy   OA (osteoarthritis)    Rotator cuff tear, left    Stroke (HCC)    Thyroid disease 2010   Multinodal goiter       Home Medications Prior to Admission medications   Medication Sig Start Date End Date Taking? Authorizing Provider  albuterol (VENTOLIN HFA) 108 (90 Base) MCG/ACT inhaler Inhale 2 puffs into the lungs every 6 (six) hours as needed for wheezing or shortness of breath. 04/28/20   Copland, Gwenlyn Found, MD  beclomethasone (QVAR REDIHALER) 80 MCG/ACT inhaler Inhale 1 puff into the lungs 2 (two) times daily. 04/21/22   Copland, Gwenlyn Found, MD  Blood Glucose Monitoring Suppl (ONE TOUCH ULTRA 2) w/Device KIT Use to check glucose up to twice daily. E11.9 dx code 10/29/19   Copland, Gwenlyn Found, MD  cetirizine (ZYRTEC) 10 MG tablet Take 1 tablet (10 mg total) by mouth daily. 07/16/16   Copland, Gwenlyn Found, MD  Cholecalciferol (VITAMIN D) 50 MCG (2000 UT) tablet Take 2,000 Units by mouth daily.    [provider]  clopidogrel (PLAVIX) 75 MG tablet Take 1 tablet (75 mg total) by mouth every morning. Overdue for appt 10/27/22   Copland, Gwenlyn Found, MD  empagliflozin (JARDIANCE) 10 MG TABS tablet Take 1 tablet (10 mg total) by mouth every morning. 10/27/22   Copland, Gwenlyn Found, MD  famotidine (PEPCID) 40 MG tablet  TAKE ONE TABLET BY MOUTH EVERY MORNING *Overdue FOR appointment 09/16/22   Copland, Gwenlyn Found, MD  FLOVENT HFA 220 MCG/ACT inhaler INHALE 1 PUFF BY MOUTH INTO LUNGS twice A DAY. MAY INCREASE TO TWO PUFFS AS NEEDED 12/25/21   Copland, Gwenlyn Found, MD  fluticasone (FLONASE) 50 MCG/ACT nasal spray Place 2 sprays into both nostrils daily. 12/28/21   Copland, Gwenlyn Found, MD  furosemide (LASIX) 20 MG tablet TAKE ONE TABLET BY MOUTH EVERY MORNING 09/16/22   Copland, Gwenlyn Found, MD  gabapentin (NEURONTIN) 300 MG capsule TAKE TWO CAPSULES BY MOUTH EVERY MORNING and TAKE TWO CAPSULES BY MOUTH EVERYDAY AT BEDTIME *Overdue FOR appointment 09/16/22   Copland, Gwenlyn Found, MD  glucose blood (ONETOUCH VERIO) test strip 1 each by Other route daily. Use as instructed    [provider]  ipratropium (ATROVENT) 0.03 % nasal spray INSTILL 1 SPRAY IN EACH NOSTRIL IN THE MORNING 09/16/22   Copland, Gwenlyn Found, MD  linagliptin (TRADJENTA) 5 MG TABS tablet Take 1 tablet (5 mg total) by mouth daily. 10/27/22   Copland, Gwenlyn Found, MD  lisinopril (ZESTRIL) 20 MG tablet TAKE ONE TABLET BY MOUTH EVERY MORNING 09/16/22   Copland, Gwenlyn Found, MD  metFORMIN (GLUCOPHAGE-XR) 750 MG 24 hr tablet TAKE ONE TABLET BY MOUTH TWICE DAILY 09/16/22   Copland, Gwenlyn Found, MD  metoprolol tartrate (LOPRESSOR) 25 MG tablet TAKE 1/2 TABLET BY MOUTH EVERY MORNING and TAKE 1/2 TABLET BY MOUTH EVERY EVENING 09/16/22   Copland, Gwenlyn Found, MD  Multiple Vitamins-Minerals (ADULT GUMMY PO) Take 2 capsules by mouth daily.    [provider]  nortriptyline (PAMELOR) 10 MG capsule Take 3 capsules (30 mg total) by mouth at bedtime. 08/18/22   Levert Feinstein, MD  OneTouch Delica Lancets 33G MISC Use to check glucose up to twice daily. E11.9 dx code 10/29/19   Copland, Gwenlyn Found, MD  [facility] VERIO test strip USE TO CHECK BLOOD GLUCOSE UP TO TWICE DAILY 05/25/21   Copland, Gwenlyn Found, MD  Spacer/Aero-Holding [facility] Use with Flovent and albuterol inhalers 03/30/21   [name], Myrene Buddy R, DO  Ubrogepant (UBRELVY) 50 MG TABS Take 1 tab at onset of migraine.  May repeat in 2 hrs, if needed.  Max dose: 2 tabs/day. This is a 30 day prescription. 08/18/22   Levert Feinstein, MD      Allergies    Influenza vaccines, Atorvastatin, Demerol, Nsaids, Pravastatin, Prednisone, Rosuvastatin, Roxicodone [oxycodone], Tramadol, Covid-19 (mrna) vaccine, Codeine, and Mango flavor [flavoring agent]    Review of Systems   Review of Systems A 10 point review of systems was performed and is negative unless otherwise reported in HPI.  Physical Exam Updated Vital Signs BP (!) 142/83   Pulse 76   Temp 97.8 F (36.6 C)   Resp 20    Ht 5\' 1"  (1.549 m)   Wt 90.3 kg   SpO2 98%   BMI 37.60 kg/m  Physical Exam General: Normal appearing female, lying in bed.  HEENT: PERRLA, EOMI, Sclera anicteric, MMM, trachea midline. Normal speech. Tongue protrudes midline.  Cardiology: RRR, no murmurs/rubs/gallops.  Resp: Normal respiratory rate and effort. CTAB, no wheezes, rhonchi, crackles.  Abd: Soft, non-tender, non-distended. No rebound tenderness or guarding.  GU: Deferred. MSK: No peripheral edema or signs of trauma. Skin: warm, dry.  Neuro: A&Ox4, CNs II-XII grossly intact. MAEs. Sensation grossly intact.  Psych: Normal mood and affect.   ED Results / Procedures / Treatments   Labs (all labs ordered are  listed, but only abnormal results are displayed) Labs Reviewed  BASIC METABOLIC PANEL - Abnormal; Notable for the following components:      Result Value   Glucose, Bld 138 (*)    All other components within normal limits    EKG None  Radiology CT ANGIO HEAD NECK W WO CM  Result Date: 10/27/2022 CLINICAL DATA:  worst headache of life with history of aneurysm EXAM: CT ANGIOGRAPHY HEAD AND NECK WITH AND WITHOUT CONTRAST TECHNIQUE: Multidetector CT imaging of the head and neck was performed using the standard protocol during bolus administration of intravenous contrast. Multiplanar CT image reconstructions and MIPs were obtained to evaluate the vascular anatomy. Carotid stenosis measurements (when applicable) are obtained utilizing NASCET criteria, using the distal internal carotid diameter as the denominator. RADIATION DOSE REDUCTION: This exam was performed according to the departmental dose-optimization program which includes automated exposure control, adjustment of the mA and/or kV according to patient size and/or use of iterative reconstruction technique. CONTRAST:  75mL OMNIPAQUE IOHEXOL 350 MG/ML SOLN COMPARISON:  Same day CT head FINDINGS: CT HEAD FINDINGS See same day CT head for intracranial findings. CTA NECK  FINDINGS Aortic arch: Standard branching. Imaged portion shows no evidence of aneurysm or dissection. No significant stenosis of the major arch vessel origins. Right carotid system: No evidence of dissection, stenosis (50% or greater), or occlusion. Left carotid system: No evidence of dissection, stenosis (50% or greater), or occlusion. Vertebral arteries: Left vertebral artery is occluded at the origin. Skeleton: Negative. Other neck: Large multinodular thyroid goiter. Upper chest: Negative. Review of the MIP images confirms the above findings CTA HEAD FINDINGS Anterior circulation: Severe focal stenosis at the origin of the left M1. Moderate stenosis at the origin of right M1. No aneurysm, or vascular malformation. 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 Severe nearly occlusive stenosis at the origin of the hypoplastic left P1. Venous sinuses: As permitted by contrast timing, patent. Anatomic variants: Fetal PCA on the left. Review of the MIP images confirms the above findings IMPRESSION: 1. No intracranial aneurysm. 2. 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 3. Severe focal stenosis at the origin of the left M1. 4. Moderate stenosis at the origin of the right M1. 5. 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 6. Large multinodular thyroid goiter. Electronically Signed   By: Lorenza Cambridge M.D.   On: 10/27/2022 15:35   CT Head Wo Contrast  Result Date: 10/27/2022 CLINICAL DATA:  Headache, dizziness EXAM: CT HEAD WITHOUT CONTRAST TECHNIQUE: Contiguous axial images were obtained from the base of the skull through the vertex without intravenous contrast. RADIATION DOSE REDUCTION: This exam was performed according to the departmental dose-optimization program which includes automated exposure control, adjustment of the mA and/or kV according to patient size and/or use of iterative reconstruction technique. COMPARISON:  CT  head 07/08/2022 FINDINGS: Brain: There is no acute intracranial hemorrhage, extra-axial fluid collection, or acute infarct. Parenchymal volume is normal. The ventricles are normal in size. Gray-white differentiation is preserved. The pituitary and suprasellar region are normal. There is no mass lesion. There is no mass effect or midline shift. Vascular: There is calcification of the bilateral carotid siphons. Skull: Normal. Negative for fracture or focal lesion. Sinuses/Orbits: The imaged paranasal sinuses are clear. The globes and orbits are unremarkable. Other: The mastoid air cells and middle ear cavities are clear. IMPRESSION: No acute intracranial pathology. Electronically Signed   By: Lesia Hausen  M.D.   On: 10/27/2022 13:15    Procedures Procedures    Medications Ordered in ED Medications  acetaminophen (TYLENOL) tablet 1,000 mg (1,000 mg Oral Given 10/27/22 1336)  ondansetron (ZOFRAN) injection 4 mg (4 mg Intravenous Given 10/27/22 1333)  lactated ringers bolus 1,000 mL (1,000 mLs Intravenous New Bag/Given 10/27/22 1343)  iohexol (OMNIPAQUE) 350 MG/ML injection 100 mL (75 mLs Intravenous Contrast Given 10/27/22 1411)    ED Course/ Medical Decision Making/ A&P                          Medical Decision Making Amount and/or Complexity of Data Reviewed Labs: ordered. Radiology: ordered.  Risk OTC drugs. Prescription drug management.    This patient presents to the ED for concern of headache, this involves an extensive number of treatment options, and is a complaint that carries with it a high risk of complications and morbidity.  I considered the following differential and admission for this acute, potentially life threatening condition.   MDM:    Patient is overall very well-appearing, HDS, non-toxic, afebrile.  Unlikely SAH: headache is non thunderclap Unlikely Subdural/epidural hematoma: no history of trauma, no anticoagulation. Unlikely Meningitis: afebrile, no  meningismus. Unlikely Temporal arteritis: no visual changes, no tenderness in temporal area Unlikely Acute angle glaucoma: PERRLA, no eye pain. Unlikely Carbon Monoxide Poisoning: no other house members with similar symptoms.  Most likely migraine given unilateral throbbing nature and recently diagnosed migraines by neurology. Tried her Bernita Raisin which didn't help much. She has No focal neurological symptoms. Patient does report that it did not improve and it is the worst headache of her life, will get CTH. Also w/ h/o aneurysm and will get CTA H&N as well, as she does report that it radiates down the back of her neck. However she has no nuchal rigidity and no FNDs which is reassuring.   Plan: CTH, CTA, will give headache cocktail and reexamine. Clinical Course as of 10/27/22 1547  Wed Oct 27, 2022  1542 CT ANGIO HEAD NECK W WO CM [HN]  1542 CT ANGIO HEAD NECK W WO CM 1. No intracranial aneurysm. 2. 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 3. Severe focal stenosis at the origin of the left M1. 4. Moderate stenosis at the origin of the right M1. 5. 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 6. Large multinodular thyroid goiter.   [HN]    Clinical Course User Index [HN] Loetta Rough, MD    Labs: I Ordered, and personally interpreted labs.  The pertinent results include:  BMP unremarkable  Imaging Studies ordered: I ordered imaging studies including CTH I independently visualized and interpreted imaging. I agree with the radiologist interpretation  Additional history obtained from chart review.   Social Determinants of Health: Lives independently  Disposition: Patient CT demonstrates severe nearly occlusive stenosis of P1 P2 junction on the right.  This is progressed from a CTA that she had done in May which demonstrated mild and moderate stenosis.  She has no focal neurodeficits at this time but because of the progression I believe it would  be pertinent to discuss with neurology.  Patient is signed out to oncoming EDP Dr. Charm Barges with pending consult to neurology.   Co morbidities that complicate the patient evaluation  Past Medical History:  Diagnosis Date   Allergy 1970   Demerol, FLu vaccine, NSAIDS, Codiene   Anxiety 2002   Ongoing   Asthma  Cataract    Small ones found by opthamolgist   Cerebral aneurysm    being watched by Dr. Link Snuffer every 6 months   CHF (congestive heart failure) (HCC)    Resolved   Complication of anesthesia HYPOTENSION INTRAOP W/ HYSTERECTOMY IN 1987--  DID OK W/ TOTAL KNEE IN 2008   COPD (chronic obstructive pulmonary disease) (HCC) 1970   Bronchitits   Depression 1970   Diabetes mellitus    DJD (degenerative joint disease)    Dyslipidemia    GERD (gastroesophageal reflux disease)    H/O hiatal hernia    History of CHF (congestive heart failure) 2010-  RESOLVED   History of peptic ulcer YRS AGO   Hypertension    Meniere's disease    Mitral valve prolapse occasional palpitations w/ chest discomfort   Neuromuscular disorder (HCC) 2002   Peripheral neuropathy   OA (osteoarthritis)    Rotator cuff tear, left    Stroke [facility])    Thyroid disease 2010   Multinodal goiter     Medicines Meds ordered this encounter  Medications   acetaminophen (TYLENOL) tablet 1,000 mg   ondansetron (ZOFRAN) injection 4 mg   lactated ringers bolus 1,000 mL   iohexol (OMNIPAQUE) 350 MG/ML injection 100 mL    I have reviewed the patients home medicines and have made adjustments as needed  Problem List / ED Course: Problem List Items Addressed This Visit   None               This note was created using dictation software, which may contain spelling or grammatical errors.    Loetta Rough, MD 10/27/22 1535    Loetta Rough, MD 10/27/22 [phone]

## 2022-10-27 NOTE — ED Provider Notes (Signed)
Signout from Dr. Jearld [name].  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  She has history of migraines and follows with Dr. Terrace Arabia from neurology [place].  She tried her migraine medication without improvement.  Came here for further evaluation.  No numbness or weakness.  CTA showing significant stenotic intracranial disease.  Plan is to review with neurology for further recommendations. Physical Exam  BP (!) 142/83   Pulse 76   Temp 97.8 F (36.6 C)   Resp 20   Ht 5\' 1"  (1.549 m)   Wt 90.3 kg   SpO2 98%   BMI 37.60 kg/m   Physical Exam  Procedures  Procedures  ED Course / MDM   Clinical Course as of 10/27/22 1547  Wed Oct 27, 2022  1542 CT ANGIO HEAD NECK W WO CM [HN]  1542 CT ANGIO HEAD NECK W WO CM 1. No intracranial aneurysm. 2. 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 3. Severe focal stenosis at the origin of the left M1. 4. Moderate stenosis at the origin of the right M1. 5. 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 6. Large multinodular thyroid goiter.   [HN]    Clinical Course User Index [HN] Loetta Rough, MD   Medical Decision Making Amount and/or Complexity of Data Reviewed Labs: ordered. Radiology: ordered. Decision-making details documented in ED Course.  Risk OTC drugs. Prescription drug management.   4pm Reviewed case with Dr. Wilford Corner neurology.  He said she does have progression of M1 segments bilaterally and would get an MRI if available.  Recommends Compazine if she still having significant headache.  I reviewed this with the patient and she is comfortable plan for MRI.  She said her headache is almost gone so we will hold off on the Compazine for now.  6:30 PM.  MRI does not show any acute findings.  Reviewed results with patient and her family members.  She is comfortable plan for discharge and outpatient follow-up with her neurologist.  Return  instructions discussed

## 2022-10-27 NOTE — ED Notes (Signed)
Pt. Reports being diagnosed with migraines several weeks ago and placed on medication for this.  Pt. Took med today and no relief today.  Pt reports having a pounding headache today.  Pt. Is dizzy and reports she drove here and was not dizzy while driving.

## 2022-10-27 NOTE — Progress Notes (Signed)
[facility] [address], [address] [place], [place] [address] [id number]  Date:  10/27/2022   Name:  Sherry [name]   DOB:  December 16, 1950   MRN:  [id number]  PCP:  Pearline Cables, MD    Chief Complaint: Follow-up (Concerns/ questions: pt has had a Migraine since work last night she took her Bernita Raisin- no relief. /Flu shot today: allergic /A1C due)   History of Present Illness:  Sherry [name] is a 72 y.o. very pleasant female patient who presents with the following:  Last seen by myself in November of last year- here today for a recheck visit -overdue for labs, etc. History of diabetes, hypertension, peripheral neuropathy, TIA 2016, hyperlipidemia, intracranial vascular stenosis/cerebral aneurysm under 45-month surveillance - however most recent CT angio from 5/24 showed NO aneurysm  Anterior circulation: Both internal carotid arteries are patent to the termini, with mild stenosis in the left supraclinoid segment, likely similar to the prior exam when accounting for differences in technique. No evidence of aneurysm. 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  She has history of migraine headache and uses [place]  She notes a headache that started around 0300 this am- she knows the time as she works night shift and she was awake She took a dose of Ubrelvy at [employer]- however it did not seem to help.  She is allowed to take a second dose but has not done so as of yet She notes this is her "worst headache of life" and that the medication did not seem to help at all  She feels like the back of her neck is achy but denies any particular neuro sx, she states "it is hard for me to think"  She then mentions that her right thumb may sometimes feel numb and that she occasionally has a stabbing chest pain,  although not currently  She notes that over the summer-I believe this was the end of May-she had an episode of feeling confused and getting lost in her familiar area.  It seems that this episode was accompanied by chest pain which was the focus of her ER evaluation-not clear if she shared these symptoms with her neurologist  She saw neurology in July: Chronic migraine Anxiety             Nortriptyline 10 mg titrating to 30 mg every night as preventive medications             Ubrelvy as needed             ESR C-reactive protein  Lab Results  Component Value Date   HGBA1C 7.2 03/25/2022     Patient Active Problem List   Diagnosis Date Noted   Statin myopathy 10/17/2022   Chronic migraine w/o aura w/o status migrainosus, not intractable 08/18/2022   Trochanteric bursitis, left hip 06/24/2021   Traumatic hematoma of left lower leg 09/26/2018   S/P total knee arthroplasty, left 03/29/2017   Spondylosis without myelopathy or radiculopathy, cervical region 03/29/2017   Bilateral carpal tunnel syndrome 03/29/2017   Acute asthma exacerbation 08/19/2016   Cervicalgia 03/23/2016   Vertigo 04/14/2015   Anxiety state 01/22/2015   HLD (hyperlipidemia) 01/22/2015   Type 2 diabetes mellitus with circulatory disorder (HCC) 01/22/2015   Intracranial vascular stenosis 01/22/2015   Aneurysm (HCC)    Headache  Essential hypertension 11/12/2014   Dependent edema 11/12/2014   Peripheral neuropathy 11/12/2014   Depression 11/12/2014   Facial droop    TIA (transient ischemic attack) 11/11/2014   Hyperthyroidism 08/06/2014   Meniere's disease 06/07/2014   Goiter 06/13/2013   Obesity, unspecified 06/13/2013    Past Medical History:  Diagnosis Date   Allergy 1970   Demerol, FLu vaccine, NSAIDS, Codiene   Anxiety 2002   Ongoing   Asthma    Cataract    Small ones found by opthamolgist   Cerebral aneurysm    being watched by Dr. Link Snuffer every 6 months   CHF (congestive heart failure)  (HCC)    Resolved   Complication of anesthesia HYPOTENSION INTRAOP W/ HYSTERECTOMY IN 1987--  DID OK W/ TOTAL KNEE IN 2008   COPD (chronic obstructive pulmonary disease) (HCC) 1970   Bronchitits   Depression 1970   Diabetes mellitus    DJD (degenerative joint disease)    Dyslipidemia    GERD (gastroesophageal reflux disease)    H/O hiatal hernia    History of CHF (congestive heart failure) 2010-  RESOLVED   History of peptic ulcer YRS AGO   Hypertension    Meniere's disease    Mitral valve prolapse occasional palpitations w/ chest discomfort   Neuromuscular disorder (HCC) 2002   Peripheral neuropathy   OA (osteoarthritis)    Rotator cuff tear, left    Stroke [facility])    Thyroid disease 2010   Multinodal goiter    Past Surgical History:  Procedure Laterality Date   BUNIONECTOMY  02/08/2005   LEFT FOOT   FRACTURE SURGERY  07/30/2019   (R) Reverse Shoulder Replacement   IR 3D INDEPENDENT WKST  01/06/2021   IR ANGIO INTRA EXTRACRAN SEL COM CAROTID INNOMINATE BILAT MOD SED  03/22/2019   IR ANGIO INTRA EXTRACRAN SEL COM CAROTID INNOMINATE BILAT MOD SED  01/06/2021   IR ANGIO VERTEBRAL SEL SUBCLAVIAN INNOMINATE UNI L MOD SED  03/22/2019   IR ANGIO VERTEBRAL SEL VERTEBRAL UNI R MOD SED  03/22/2019   IR ANGIO VERTEBRAL SEL VERTEBRAL UNI R MOD SED  01/06/2021   IR US GUIDE VASC ACCESS RIGHT  03/22/2019   IR US GUIDE VASC ACCESS RIGHT  01/06/2021   JOINT REPLACEMENT  2008, 2011,2021   Total knee replacement, '08 (R), '11 (L)   LEFT SHOULDER SURGERY  02/09/1995   ROTATOR CUFF REPAIR   REVERSE SHOULDER ARTHROPLASTY Right 07/30/2020   Procedure: REVERSE SHOULDER ARTHROPLASTY;  Surgeon: Bjorn Pippin, MD;  Location: [facility];  Service: Orthopedics;  Laterality: Right;   SHOULDER ARTHROSCOPY  09/20/2011   Procedure: ARTHROSCOPY SHOULDER;  Surgeon: Javier Docker, MD;  Location: [facility];  Service: Orthopedics;  Laterality: Left;  SAD AND MINI OPEN ROTATOR CUFF REPAIR      TOTAL KNEE ARTHROPLASTY  09-12-2006  [facility]   RIGHT KNEE   TOTAL KNEE ARTHROPLASTY Left 11/30/2017   Procedure: LEFT TOTAL KNEE REPLACEMENT;  Surgeon: Eldred Manges, MD;  Location: [facility] OR;  Service: Orthopedics;  Laterality: Left;   TUBAL LIGATION  02/08/1974   TYMPANOPLASTY  1990;   1980   VAGINAL HYSTERECTOMY  02/08/1985    Social History   Tobacco Use   Smoking status: Never    Passive exposure: Never   Smokeless tobacco: Never  Vaping Use   Vaping status: Never Used  Substance Use Topics   Alcohol use: No   Drug use: No    Family History  Problem Relation Age  of Onset   Heart disease Mother        in her 32s, heart attack   Diabetes Mother    Kidney disease Mother        dialysis   Thyroid disease Mother    Aneurysm Mother    Ovarian cancer Mother    Breast cancer Mother    Arthritis Mother    Cancer Mother    Depression Mother    Stroke Mother    Thyroid disease Sister    Heart disease Sister    Diabetes Sister    Hyperlipidemia Sister    Hypertension Sister    Lymphoma Father    Diabetes Father    Hypertension Father    Congestive Heart Failure Father    Arthritis Father    Cancer Father    Vision loss Father    Thyroid disease Brother    Hyperlipidemia Brother    Hypertension Brother    Obesity Brother    Thyroid disease Paternal Grandmother    Arthritis Paternal Grandmother    Heart attack Sister        at age 11   Stroke Sister    Hypertension Sister    Arthritis Sister    Asthma Sister    Cancer Sister    Diabetes Sister    Hearing loss Sister    Hyperlipidemia Sister    Obesity Sister    Vision loss Sister    Breast cancer Maternal Aunt    Breast cancer Maternal Grandmother    Anxiety disorder Maternal Grandmother    Breast cancer Maternal Aunt    Breast cancer Maternal Aunt    COPD Paternal Grandfather    Early death Paternal Grandfather    Other Brother        Set designer accident   Drug abuse Brother    Bipolar disorder Son    Other  Son        GSW   Early death Son    ADD / ADHD Daughter    Diabetes Daughter    Anxiety disorder Daughter    Diabetes Daughter    Obesity Daughter    Early death Brother     Allergies  Allergen Reactions   Influenza Vaccines Anaphylaxis   Atorvastatin Other (See Comments)    Joint pain / myalgias   Demerol Other (See Comments)    SEIZURE   Nsaids Other (See Comments)    NAPROXEN, IBUPROFEN, SKELAXIN---  CAUSES PALPITATIONS   Pravastatin Other (See Comments)    Myalgias    Prednisone Other (See Comments)    Caused her glucose to go up to 500 and went to ED   Rosuvastatin Other (See Comments)    Joint stiffness and muscle pain   Roxicodone [Oxycodone] Itching and Other (See Comments)    Hallucinations and itching   Tramadol Other (See Comments)    Contraindicated with Victoza    Covid-19 (Mrna) Vaccine     Patient had anaphylactic reaction to flu vaccine   Codeine Itching   Mango Flavor [Flavoring Agent] Itching    Medication list has been reviewed and updated.  Current Outpatient Medications on File Prior to Visit  Medication Sig Dispense Refill   albuterol (VENTOLIN HFA) 108 (90 Base) MCG/ACT inhaler Inhale 2 puffs into the lungs every 6 (six) hours as needed for wheezing or shortness of breath. 18 g 2   beclomethasone (QVAR REDIHALER) 80 MCG/ACT inhaler Inhale 1 puff into the lungs 2 (two) times daily. 1 each 6  Blood Glucose Monitoring Suppl (ONE TOUCH ULTRA 2) w/Device KIT Use to check glucose up to twice daily. E11.9 dx code 1 kit 0   cetirizine (ZYRTEC) 10 MG tablet Take 1 tablet (10 mg total) by mouth daily. 90 tablet 3   Cholecalciferol (VITAMIN D) 50 MCG (2000 UT) tablet Take 2,000 Units by mouth daily.     famotidine (PEPCID) 40 MG tablet TAKE ONE TABLET BY MOUTH EVERY MORNING *Overdue FOR appointment 30 tablet 0   FLOVENT HFA 220 MCG/ACT inhaler INHALE 1 PUFF BY MOUTH INTO LUNGS twice A DAY. MAY INCREASE TO TWO PUFFS AS NEEDED 12 g 5   fluticasone (FLONASE)  50 MCG/ACT nasal spray Place 2 sprays into both nostrils daily. 16 g 6   furosemide (LASIX) 20 MG tablet TAKE ONE TABLET BY MOUTH EVERY MORNING 90 tablet 0   gabapentin (NEURONTIN) 300 MG capsule TAKE TWO CAPSULES BY MOUTH EVERY MORNING and TAKE TWO CAPSULES BY MOUTH EVERYDAY AT BEDTIME *Overdue FOR appointment 120 capsule 0   ipratropium (ATROVENT) 0.03 % nasal spray INSTILL 1 SPRAY IN EACH NOSTRIL IN THE MORNING 60 mL 0   lisinopril (ZESTRIL) 20 MG tablet TAKE ONE TABLET BY MOUTH EVERY MORNING 90 tablet 0   metFORMIN (GLUCOPHAGE-XR) 750 MG 24 hr tablet TAKE ONE TABLET BY MOUTH TWICE DAILY 180 tablet 0   metoprolol tartrate (LOPRESSOR) 25 MG tablet TAKE 1/2 TABLET BY MOUTH EVERY MORNING and TAKE 1/2 TABLET BY MOUTH EVERY EVENING 90 tablet 0   Multiple Vitamins-Minerals (ADULT GUMMY PO) Take 2 capsules by mouth daily.     nortriptyline (PAMELOR) 10 MG capsule Take 3 capsules (30 mg total) by mouth at bedtime. 90 capsule 11   OneTouch Delica Lancets 33G MISC Use to check glucose up to twice daily. E11.9 dx code 200 each 3   ONETOUCH VERIO test strip USE TO CHECK BLOOD GLUCOSE UP TO TWICE DAILY 200 strip 6   Spacer/Aero-Holding Chambers DEVI Use with Flovent and albuterol inhalers 1 each 0   Ubrogepant (UBRELVY) 50 MG TABS Take 1 tab at onset of migraine.  May repeat in 2 hrs, if needed.  Max dose: 2 tabs/day. This is a 30 day prescription. 12 tablet 11   glucose blood (ONETOUCH VERIO) test strip 1 each by Other route daily. Use as instructed     No current facility-administered medications on file prior to visit.    Review of Systems:  As per HPI- otherwise negative.   Physical Examination: Vitals:   10/27/22 1035  BP: 120/78  Pulse: 70  Resp: 18  Temp: 97.8 F (36.6 C)  SpO2: 98%   Vitals:   10/27/22 1035  Weight: 199 lb (90.3 kg)  Height: 5\' 1"  (1.549 m)   Body mass index is 37.6 kg/m. Ideal Body Weight: Weight in (lb) to have BMI = 25: 132  GEN: no acute distress.  Mildly  obese, looks well HEENT: Atraumatic, Normocephalic.  Bilateral TM wnl, oropharynx normal.  PEERL,EOMI.   Ears and Nose: No external deformity. CV: RRR, No M/G/R. No JVD. No thrill. No extra heart sounds. PULM: CTA B, no wheezes, crackles, rhonchi. No retractions. No resp. distress. No accessory muscle use. ABD: S, NT, ND, +BS. No rebound. No HSM. EXTR: No c/c/e PSYCH: Normally interactive. Conversant.  Normal strength, sensation, deep tendon reflex of all limbs.  Normal facial movement  Assessment and Plan: Worst headache of life  Type 2 diabetes mellitus with complication, without long-term current use of insulin (HCC) - Plan: empagliflozin (  JARDIANCE) 10 MG TABS tablet, linagliptin (TRADJENTA) 5 MG TABS tablet  Patient seen today with concern of worst headache of life.  She also notes she feels like she is having some difficulty thinking.  Of note, she is still working overnight at age 27.  I did advise her that this work routine is likely not ideal at her age We discussed options for evaluating her headache.  She would like to be seen at the ER and hopefully treated for severe headache symptoms-she is accompanied to our in-house emergency department by my staff  I refilled medications as above  Asked her to please come and see me soon for diabetes follow-up, etc.   Signed Abbe Amsterdam, MD

## 2022-10-28 ENCOUNTER — Telehealth: Payer: Self-pay | Admitting: Neurology

## 2022-10-28 ENCOUNTER — Ambulatory Visit (INDEPENDENT_AMBULATORY_CARE_PROVIDER_SITE_OTHER): Payer: Medicare HMO | Admitting: Neurology

## 2022-10-28 ENCOUNTER — Encounter: Payer: Self-pay | Admitting: Neurology

## 2022-10-28 VITALS — BP 136/78 | Ht 61.0 in | Wt 199.0 lb

## 2022-10-28 DIAGNOSIS — I679 Cerebrovascular disease, unspecified: Secondary | ICD-10-CM

## 2022-10-28 DIAGNOSIS — G43709 Chronic migraine without aura, not intractable, without status migrainosus: Secondary | ICD-10-CM

## 2022-10-28 DIAGNOSIS — G44211 Episodic tension-type headache, intractable: Secondary | ICD-10-CM

## 2022-10-28 MED ORDER — BUPIVACAINE HCL (PF) 0.5 % IJ SOLN
8.0000 mL | Freq: Once | INTRAMUSCULAR | Status: AC
Start: 2022-10-28 — End: 2022-10-28
  Administered 2022-10-28: 8 mL

## 2022-10-28 NOTE — Telephone Encounter (Signed)
Please call patient if she is still have significant headache may come in today for nerve block

## 2022-10-28 NOTE — Telephone Encounter (Signed)
Returned call to patient, she report she works 3rd shift and got off yesterday morning with the worst headache of her life and went to her PCP. She was then sent to the ER by PCP for confusion. In the ER she had a CT scan and MRI and per patient the results showed "blood vessel narrowing her her brain" and"left carotid artery almost completely blocked". She left the ER last night around 9:30  and her head is still pounding and feels like it is squeezing. She is taking Ubrelvy and Tylenol with no relief. She denies stroke like symptoms at the time of call and verbalized understanding of stroke symptoms and to go back to ER if symptoms present. Advised I would send to Dr. Terrace Arabia for review and follow back up. Patient appreciative of call.

## 2022-10-28 NOTE — Progress Notes (Signed)
05.% Bupivicaine NDC 14782-956-21 lot/[id number] Ex 12/2024

## 2022-10-28 NOTE — Telephone Encounter (Signed)
Scheduled

## 2022-10-28 NOTE — Telephone Encounter (Signed)
Pt is asking for a call to discuss her recent release from the hospital.  Her current medication is not helping, pt states her head is throbbing, and she is having difficulty thinking.  Pt would very much like a call as soon as possible.

## 2022-10-28 NOTE — Progress Notes (Signed)
ASSESSMENT AND PLAN  Sherry [name] is a 72 y.o. female   Chronic migraine Cerebral vascular disease  Nortriptyline 10 mg titrating to 30 mg every night as preventive medications  Ubrelvy as needed  ESR C-reactive protein was normal,  MRI of the brain showed no significant abnormalities,  CT angiogram showed intracranial atherosclerotic disease, vascular risk factor of aging, hypertension, diabetes, continue Plavix daily  Prolonged severe migraine headache,  Received trigger point injection today, reported immediate improvement     DIAGNOSTIC DATA (LABS, IMAGING, TESTING) - I reviewed patient records, labs, notes, testing and imaging myself where available.   MEDICAL HISTORY:  Sherry [name] is a 72 year old female, seen in request by her primary care physician Dr.   Abbe Amsterdam for evaluation of headache, initial evaluation was on August 18, 2022  I reviewed and summarized the referring note. PMHX Dm HTN Stroke in 2006, with left hemiparesis, Left bunionectomy Right reverse shoulder replacement Brain aneurysm Hx of bilateral knee replacement   She underwent a lot of stress, lost a lot of family members, began to have frequent headache over the past few months, couple times each months, sometimes proceeding with seeing sparkling her eyes, left visual field blurry, then holoacranial, occipital area pressure headache lasting for few hours, often improved by sleep,  Reported long history of headache, when she was younger in elementary school, could remember her mother tied a scarf around her head when she complains of a headache  She complains of frequent headaches throughout her adult life,  Presented to emergency room on Jul 08, 2022 for headache, chest pain, troponin was negative,  CT angiogram head and neck no significant large vessel disease, no evidence of aneurysm, previously described in 2022 four-vessel angiogram, 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  Evidence of intracranial atherosclerotic disease,  UPDATE Sept 19 2024: She presented to emergency room October 27, 2022, complains of throbbing right-sided headache started around 3 AM, gradual onset progressively getting worse, tried [place] without relief, she does complains of light noise sensitivity:, Nauseous,  Had extensive imaging study, personally reviewed the film, MRI of the brain without contrast no acute intracranial abnormality CT angiogram of head and neck, no evidence of intracranial aneurysm, multivessel intra cranial atherosclerotic disease  Laboratory in July 2024 showed normal C-reactive protein, ESR, CBC,  Today she called office to receive acute treatment for her persistent headache, described 7 out of 10, light sensitivity, hard for her to function  PHYSICAL EXAM:   Vitals:   10/28/22 1515  Weight: 199 lb (90.3 kg)  Height: 5\' 1"  (1.549 m)  136/78  PHYSICAL EXAMNIATION:  Gen: NAD, conversant, well nourised, well groomed                     Cardiovascular: Regular rate rhythm, no peripheral edema, warm, nontender. Eyes: Conjunctivae clear without exudates or hemorrhage Neck: Supple, no carotid bruits. Pulmonary: Clear to auscultation bilaterally   NEUROLOGICAL EXAM:  MENTAL STATUS: Speech/cognition: Awake, alert, oriented to history taking and casual conversation, in mild distress CRANIAL NERVES: CN II: Visual fields are full to confrontation. Pupils are round equal and briskly reactive to light. CN III, IV, VI: extraocular movement are normal. No ptosis. CN V: Facial sensation is intact to light touch CN VII: Face is symmetric with normal eye closure  CN VIII: Hearing is normal to causal conversation. CN IX, X: Phonation is normal. CN XI: Head turning and shoulder  shrug are intact  MOTOR: There is no pronator drift of out-stretched arms. Muscle bulk and tone are normal. Muscle strength is  normal.  REFLEXES: Reflexes are 2+ and symmetric at the biceps, triceps, knees, and ankles. Plantar responses are flexor.  SENSORY: Intact to light touch, pinprick and vibratory sensation are intact in fingers and toes.  COORDINATION: There is no trunk or limb dysmetria noted.  GAIT/STANCE: Posture is normal. Gait is steady with normal steps, base, arm swing, and turning. Heel and toe walking are normal. Tandem gait is normal.  Romberg is absent.  REVIEW OF SYSTEMS:  Full 14 system review of systems performed and notable only for as above All other review of systems were negative.   ALLERGIES: Allergies  Allergen Reactions   Influenza Vaccines Anaphylaxis   Atorvastatin Other (See Comments)    Joint pain / myalgias   Demerol Other (See Comments)    SEIZURE   Nsaids Other (See Comments)    NAPROXEN, IBUPROFEN, SKELAXIN---  CAUSES PALPITATIONS   Pravastatin Other (See Comments)    Myalgias    Prednisone Other (See Comments)    Caused her glucose to go up to 500 and went to ED   Rosuvastatin Other (See Comments)    Joint stiffness and muscle pain   Roxicodone [Oxycodone] Itching and Other (See Comments)    Hallucinations and itching   Tramadol Other (See Comments)    Contraindicated with Victoza    Covid-19 (Mrna) Vaccine     Patient had anaphylactic reaction to flu vaccine   Codeine Itching   Mango Flavor [Flavoring Agent] Itching    HOME MEDICATIONS: Current Outpatient Medications  Medication Sig Dispense Refill   albuterol (VENTOLIN HFA) 108 (90 Base) MCG/ACT inhaler Inhale 2 puffs into the lungs every 6 (six) hours as needed for wheezing or shortness of breath. 18 g 2   beclomethasone (QVAR REDIHALER) 80 MCG/ACT inhaler Inhale 1 puff into the lungs 2 (two) times daily. 1 each 6   Blood Glucose Monitoring Suppl (ONE TOUCH ULTRA 2) w/Device KIT Use to check glucose up to twice daily. E11.9 dx code 1 kit 0   cetirizine (ZYRTEC) 10 MG tablet Take 1 tablet (10 mg  total) by mouth daily. 90 tablet 3   Cholecalciferol (VITAMIN D) 50 MCG (2000 UT) tablet Take 2,000 Units by mouth daily.     clopidogrel (PLAVIX) 75 MG tablet Take 1 tablet (75 mg total) by mouth every morning. Overdue for appt 90 tablet 3   empagliflozin (JARDIANCE) 10 MG TABS tablet Take 1 tablet (10 mg total) by mouth every morning. 90 tablet 3   famotidine (PEPCID) 40 MG tablet TAKE ONE TABLET BY MOUTH EVERY MORNING *Overdue FOR appointment 30 tablet 0   FLOVENT HFA 220 MCG/ACT inhaler INHALE 1 PUFF BY MOUTH INTO LUNGS twice A DAY. MAY INCREASE TO TWO PUFFS AS NEEDED 12 g 5   fluticasone (FLONASE) 50 MCG/ACT nasal spray Place 2 sprays into both nostrils daily. 16 g 6   furosemide (LASIX) 20 MG tablet TAKE ONE TABLET BY MOUTH EVERY MORNING 90 tablet 0   gabapentin (NEURONTIN) 300 MG capsule TAKE TWO CAPSULES BY MOUTH EVERY MORNING and TAKE TWO CAPSULES BY MOUTH EVERYDAY AT BEDTIME *Overdue FOR appointment 120 capsule 0   glucose blood (ONETOUCH VERIO) test strip 1 each by Other route daily. Use as instructed     ipratropium (ATROVENT) 0.03 % nasal spray INSTILL 1 SPRAY IN EACH NOSTRIL IN THE MORNING 60 mL 0   linagliptin (  TRADJENTA) 5 MG TABS tablet Take 1 tablet (5 mg total) by mouth daily. 90 tablet 3   lisinopril (ZESTRIL) 20 MG tablet TAKE ONE TABLET BY MOUTH EVERY MORNING 90 tablet 0   metFORMIN (GLUCOPHAGE-XR) 750 MG 24 hr tablet TAKE ONE TABLET BY MOUTH TWICE DAILY 180 tablet 0   metoprolol tartrate (LOPRESSOR) 25 MG tablet TAKE 1/2 TABLET BY MOUTH EVERY MORNING and TAKE 1/2 TABLET BY MOUTH EVERY EVENING 90 tablet 0   Multiple Vitamins-Minerals (ADULT GUMMY PO) Take 2 capsules by mouth daily.     nortriptyline (PAMELOR) 10 MG capsule Take 3 capsules (30 mg total) by mouth at bedtime. 90 capsule 11   OneTouch Delica Lancets 33G MISC Use to check glucose up to twice daily. E11.9 dx code 200 each 3   ONETOUCH VERIO test strip USE TO CHECK BLOOD GLUCOSE UP TO TWICE DAILY 200 strip 6    Spacer/Aero-Holding Chambers DEVI Use with Flovent and albuterol inhalers 1 each 0   Ubrogepant (UBRELVY) 50 MG TABS Take 1 tab at onset of migraine.  May repeat in 2 hrs, if needed.  Max dose: 2 tabs/day. This is a 30 day prescription. 12 tablet 11   No current facility-administered medications for this visit.    PAST MEDICAL HISTORY: Past Medical History:  Diagnosis Date   Allergy 1970   Demerol, FLu vaccine, NSAIDS, Codiene   Anxiety 2002   Ongoing   Asthma    Cataract    Small ones found by opthamolgist   Cerebral aneurysm    being watched by Dr. Link Snuffer every 6 months   CHF (congestive heart failure) (HCC)    Resolved   Complication of anesthesia HYPOTENSION INTRAOP W/ HYSTERECTOMY IN 1987--  DID OK W/ TOTAL KNEE IN 2008   COPD (chronic obstructive pulmonary disease) (HCC) 1970   Bronchitits   Depression 1970   Diabetes mellitus    DJD (degenerative joint disease)    Dyslipidemia    GERD (gastroesophageal reflux disease)    H/O hiatal hernia    History of CHF (congestive heart failure) 2010-  RESOLVED   History of peptic ulcer YRS AGO   Hypertension    Meniere's disease    Mitral valve prolapse occasional palpitations w/ chest discomfort   Neuromuscular disorder (HCC) 2002   Peripheral neuropathy   OA (osteoarthritis)    Rotator cuff tear, left    Stroke (HCC)    Thyroid disease 2010   Multinodal goiter    PAST SURGICAL HISTORY: Past Surgical History:  Procedure Laterality Date   BUNIONECTOMY  02/08/2005   LEFT FOOT   FRACTURE SURGERY  07/30/2019   (R) Reverse Shoulder Replacement   IR 3D INDEPENDENT WKST  01/06/2021   IR ANGIO INTRA EXTRACRAN SEL COM CAROTID INNOMINATE BILAT MOD SED  03/22/2019   IR ANGIO INTRA EXTRACRAN SEL COM CAROTID INNOMINATE BILAT MOD SED  01/06/2021   IR ANGIO VERTEBRAL SEL SUBCLAVIAN INNOMINATE UNI L MOD SED  03/22/2019   IR ANGIO VERTEBRAL SEL VERTEBRAL UNI R MOD SED  03/22/2019   IR ANGIO VERTEBRAL SEL VERTEBRAL UNI R MOD SED   01/06/2021   IR US GUIDE VASC ACCESS RIGHT  03/22/2019   IR US GUIDE VASC ACCESS RIGHT  01/06/2021   JOINT REPLACEMENT  2008, 2011,2021   Total knee replacement, '08 (R), '11 (L)   LEFT SHOULDER SURGERY  02/09/1995   ROTATOR CUFF REPAIR   REVERSE SHOULDER ARTHROPLASTY Right 07/30/2020   Procedure: REVERSE SHOULDER ARTHROPLASTY;  Surgeon: Ramond Marrow  T, MD;  Location: [facility];  Service: Orthopedics;  Laterality: Right;   SHOULDER ARTHROSCOPY  09/20/2011   Procedure: ARTHROSCOPY SHOULDER;  Surgeon: Javier Docker, MD;  Location: [facility];  Service: Orthopedics;  Laterality: Left;  SAD AND MINI OPEN ROTATOR CUFF REPAIR     TOTAL KNEE ARTHROPLASTY  09-12-2006  [facility]   RIGHT KNEE   TOTAL KNEE ARTHROPLASTY Left 11/30/2017   Procedure: LEFT TOTAL KNEE REPLACEMENT;  Surgeon: Eldred Manges, MD;  Location: [facility];  Service: Orthopedics;  Laterality: Left;   TUBAL LIGATION  02/08/1974   TYMPANOPLASTY  1990;   1980   VAGINAL HYSTERECTOMY  02/08/1985    FAMILY HISTORY: Family History  Problem Relation Age of Onset   Heart disease Mother        in her 32s, heart attack   Diabetes Mother    Kidney disease Mother        dialysis   Thyroid disease Mother    Aneurysm Mother    Ovarian cancer Mother    Breast cancer Mother    Arthritis Mother    Cancer Mother    Depression Mother    Stroke Mother    Thyroid disease Sister    Heart disease Sister    Diabetes Sister    Hyperlipidemia Sister    Hypertension Sister    Lymphoma Father    Diabetes Father    Hypertension Father    Congestive Heart Failure Father    Arthritis Father    Cancer Father    Vision loss Father    Thyroid disease Brother    Hyperlipidemia Brother    Hypertension Brother    Obesity Brother    Thyroid disease Paternal Grandmother    Arthritis Paternal Grandmother    Heart attack Sister        at age 38   Stroke Sister    Hypertension Sister    Arthritis Sister    Asthma Sister    Cancer  Sister    Diabetes Sister    Hearing loss Sister    Hyperlipidemia Sister    Obesity Sister    Vision loss Sister    Breast cancer Maternal Aunt    Breast cancer Maternal Grandmother    Anxiety disorder Maternal Grandmother    Breast cancer Maternal Aunt    Breast cancer Maternal Aunt    COPD Paternal Grandfather    Early death Paternal Grandfather    Other Brother        Set designer accident   Drug abuse Brother    Bipolar disorder Son    Other Son        GSW   Early death Son    ADD / ADHD Daughter    Diabetes Daughter    Anxiety disorder Daughter    Diabetes Daughter    Obesity Daughter    Early death Brother     SOCIAL HISTORY: Social History   Socioeconomic History   Marital status: Widowed    Spouse name: Not on file   Number of children: 4   Years of education: 16   Highest education level: Bachelor's degree (e.g., BA, AB, BS)  Occupational History   Occupation: LPN - retired  Tobacco Use   Smoking status: Never    Passive exposure: Never   Smokeless tobacco: Never  Vaping Use   Vaping status: Never Used  Substance and Sexual Activity   Alcohol use: No   Drug use: No   Sexual activity:  Not Currently    Birth control/protection: Post-menopausal  Other Topics Concern   Not on file  Social History Narrative   Right handed   Caffeine- 1 cup daily   Lives at home alone   Social Determinants of Health   Financial Resource Strain: Low Risk  (08/17/2022)   Overall Financial Resource Strain (CARDIA)    Difficulty of Paying Living Expenses: Not hard at all  Food Insecurity: No Food Insecurity (08/17/2022)   Hunger Vital Sign    Worried About Running Out of Food in the Last Year: Never true    Ran Out of Food in the Last Year: Never true  Transportation Needs: No Transportation Needs (08/17/2022)   PRAPARE - Administrator, Civil Service (Medical): No    Lack of Transportation (Non-Medical): No  Physical Activity: Inactive (08/17/2022)   Exercise Vital  Sign    Days of Exercise per Week: 0 days    Minutes of Exercise per Session: 0 min  Stress: Stress Concern Present (08/17/2022)   [employer] of Occupational Health - Occupational Stress Questionnaire    Feeling of Stress : To some extent  Social Connections: Unknown (08/17/2022)   Social Connection and Isolation Panel [NHANES]    Frequency of Communication with Friends and Family: More than three times a week    Frequency of Social Gatherings with Friends and Family: Once a week    Attends Religious Services: Not on Marketing executive or Organizations: No    Attends Banker Meetings: Patient declined    Marital Status: Widowed  Intimate Partner Violence: Not At Risk (08/18/2022)   Humiliation, Afraid, Rape, and Kick questionnaire    Fear of Current or Ex-Partner: No    Emotionally Abused: No    Physically Abused: No    Sexually Abused: No      Levert Feinstein, M.D. Ph.D.  [facility] [address], [address] [place], [place] [address] Ph: [phone] Fax: [phone]  CC:  Pearline Cables, MD [address] [place],  [place] [address]  Cop[place], Gwenlyn Found, MD

## 2022-10-28 NOTE — Progress Notes (Signed)
   History: 72 year old female, with persistent moderate to severe headache for 2 days, failed home remedy    Bilateral occipital, parietal temporal region,  bilateral cervical and upper trapezius muscles trigger point injection for intractable headache  Bupivacaine 0.5% was injected on the scalp bilaterally at several locations:  -On the occipital area of the head, 3 injections each side, 0.5 cc per injection at the midpoint between the mastoid process and the occipital protuberance. 2 other injections were done one finger breadth from the initial injection, one at a 10 o'clock position and the other at a 2 o'clock position.  -2 injections of 0.5 cc were done in the temporal regions, 2 fingerbreadths above the tragus of the ear, with the second injection one fingerbreadth posteriorly to the first.  -2 injections were done on the brow, 1 in the medial brow and one over the supraorbital nerve notch, with 0.1 cc for each injection  -1 injection each side of 0.5 cc was done anterior to the tragus of the ear for a trigeminal ganglion injection  -0.5 cc was injected into bilateral upper trapezius and bilateral upper cervical paraspinals   The patient tolerated the injections well, no complications of the procedure were noted. Injections were made with a 27-gauge needle.

## 2022-10-29 ENCOUNTER — Other Ambulatory Visit (HOSPITAL_BASED_OUTPATIENT_CLINIC_OR_DEPARTMENT_OTHER): Payer: Self-pay

## 2022-11-01 ENCOUNTER — Other Ambulatory Visit (HOSPITAL_BASED_OUTPATIENT_CLINIC_OR_DEPARTMENT_OTHER): Payer: Self-pay

## 2022-11-01 ENCOUNTER — Other Ambulatory Visit: Payer: Self-pay | Admitting: Family Medicine

## 2022-11-01 DIAGNOSIS — E118 Type 2 diabetes mellitus with unspecified complications: Secondary | ICD-10-CM

## 2022-11-01 MED ORDER — GABAPENTIN 300 MG PO CAPS
600.0000 mg | ORAL_CAPSULE | Freq: Two times a day (BID) | ORAL | 0 refills | Status: DC
Start: 2022-11-01 — End: 2022-11-22
  Filled 2022-11-01: qty 120, 30d supply, fill #0

## 2022-11-02 NOTE — Progress Notes (Unsigned)
[facility] [address] [place], [place] [address] [phone]  Date:  11/03/2022   Name:  Sherry [name]   DOB:  1950-11-02   MRN:  [id number]  PCP:  Pearline Cables, MD    Chief Complaint: No chief complaint on file.   History of Present Illness:  Sherry [name] is a 72 y.o. very pleasant female patient who presents with the following:  Patient seen today for follow-up from recent ER visit I saw her in clinic last week on 9/18-at that time she had complaint of "worst headache of life" History of diabetes, hypertension, peripheral neuropathy, TIA 2016, hyperlipidemia, intracranial vascular stenosis/cerebral aneurysm under 95-month surveillance - however most recent CT angio from 5/24 showed NO aneurysm   We referred her to the ER on 9/18 due to her severe headache-she had a thorough evaluation including CT angio of her head and neck and a brain MRI Brain MRI was read as normal CT angiogram showed the following: IMPRESSION: 1. No intracranial aneurysm. 2. 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 3. Severe focal stenosis at the origin of the left M1. 4. Moderate stenosis at the origin of the right M1. 5. 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 6. Large multinodular thyroid goiter.  Dispo plan: 4pm Reviewed case with Dr. Wilford Corner neurology.  He said she does have progression of M1 segments bilaterally and would get an MRI if available.  Recommends Compazine if she still having significant headache.  I reviewed this with the patient and she is comfortable plan for MRI.  She said her headache is almost gone so we will hold off on the Compazine for now.   6:30 PM.  MRI does not show any acute findings.  Reviewed results with patient and her family members.  Confirmed with neuro Dr Wilford Corner recommendation for outpatient followup. She is comfortable plan for discharge and  outpatient follow-up with her neurologist.  Return instructions discussed  Lab Results  Component Value Date   HGBA1C 7.2 03/25/2022     Patient Active Problem List   Diagnosis Date Noted   Statin myopathy 10/17/2022   Chronic migraine w/o aura w/o status migrainosus, not intractable 08/18/2022   Trochanteric bursitis, left hip 06/24/2021   Traumatic hematoma of left lower leg 09/26/2018   S/P total knee arthroplasty, left 03/29/2017   Spondylosis without myelopathy or radiculopathy, cervical region 03/29/2017   Bilateral carpal tunnel syndrome 03/29/2017   Acute asthma exacerbation 08/19/2016   Cervicalgia 03/23/2016   Vertigo 04/14/2015   Anxiety state 01/22/2015   HLD (hyperlipidemia) 01/22/2015   Type 2 diabetes mellitus with circulatory disorder (HCC) 01/22/2015   Cerebral vascular disease 01/22/2015   Aneurysm (HCC)    Headache    Essential hypertension 11/12/2014   Dependent edema 11/12/2014   Peripheral neuropathy 11/12/2014   Depression 11/12/2014   Facial droop    TIA (transient ischemic attack) 11/11/2014   Hyperthyroidism 08/06/2014   Meniere's disease 06/07/2014   Goiter 06/13/2013   Obesity, unspecified 06/13/2013    Past Medical History:  Diagnosis Date   Allergy 1970   Demerol, FLu vaccine, NSAIDS, Codiene   Anxiety 2002   Ongoing   Asthma    Cataract    Small ones found by opthamolgist   Cerebral aneurysm    being watched by Dr. Link Snuffer every 6 months   CHF (congestive heart failure) (HCC)  Resolved   Complication of anesthesia HYPOTENSION INTRAOP W/ HYSTERECTOMY IN 1987--  DID OK W/ TOTAL KNEE IN 2008   COPD (chronic obstructive pulmonary disease) (HCC) 1970   Bronchitits   Depression 1970   Diabetes mellitus    DJD (degenerative joint disease)    Dyslipidemia    GERD (gastroesophageal reflux disease)    H/O hiatal hernia    History of CHF (congestive heart failure) 2010-  RESOLVED   History of peptic ulcer YRS AGO   Hypertension     Meniere's disease    Mitral valve prolapse occasional palpitations w/ chest discomfort   Neuromuscular disorder (HCC) 2002   Peripheral neuropathy   OA (osteoarthritis)    Rotator cuff tear, left    Stroke [facility])    Thyroid disease 2010   Multinodal goiter    Past Surgical History:  Procedure Laterality Date   BUNIONECTOMY  02/08/2005   LEFT FOOT   FRACTURE SURGERY  07/30/2019   (R) Reverse Shoulder Replacement   IR 3D INDEPENDENT WKST  01/06/2021   IR ANGIO INTRA EXTRACRAN SEL COM CAROTID INNOMINATE BILAT MOD SED  03/22/2019   IR ANGIO INTRA EXTRACRAN SEL COM CAROTID INNOMINATE BILAT MOD SED  01/06/2021   IR ANGIO VERTEBRAL SEL SUBCLAVIAN INNOMINATE UNI L MOD SED  03/22/2019   IR ANGIO VERTEBRAL SEL VERTEBRAL UNI R MOD SED  03/22/2019   IR ANGIO VERTEBRAL SEL VERTEBRAL UNI R MOD SED  01/06/2021   IR US GUIDE VASC ACCESS RIGHT  03/22/2019   IR US GUIDE VASC ACCESS RIGHT  01/06/2021   JOINT REPLACEMENT  2008, 2011,2021   Total knee replacement, '08 (R), '11 (L)   LEFT SHOULDER SURGERY  02/09/1995   ROTATOR CUFF REPAIR   REVERSE SHOULDER ARTHROPLASTY Right 07/30/2020   Procedure: REVERSE SHOULDER ARTHROPLASTY;  Surgeon: Bjorn Pippin, MD;  Location: [facility];  Service: Orthopedics;  Laterality: Right;   SHOULDER ARTHROSCOPY  09/20/2011   Procedure: ARTHROSCOPY SHOULDER;  Surgeon: Javier Docker, MD;  Location: [facility];  Service: Orthopedics;  Laterality: Left;  SAD AND MINI OPEN ROTATOR CUFF REPAIR     TOTAL KNEE ARTHROPLASTY  09-12-2006  [facility]   RIGHT KNEE   TOTAL KNEE ARTHROPLASTY Left 11/30/2017   Procedure: LEFT TOTAL KNEE REPLACEMENT;  Surgeon: Eldred Manges, MD;  Location: [facility];  Service: Orthopedics;  Laterality: Left;   TUBAL LIGATION  02/08/1974   TYMPANOPLASTY  1990;   1980   VAGINAL HYSTERECTOMY  02/08/1985    Social History   Tobacco Use   Smoking status: Never    Passive exposure: Never   Smokeless tobacco: Never  Vaping Use   Vaping  status: Never Used  Substance Use Topics   Alcohol use: No   Drug use: No    Family History  Problem Relation Age of Onset   Heart disease Mother        in her 62s, heart attack   Diabetes Mother    Kidney disease Mother        dialysis   Thyroid disease Mother    Aneurysm Mother    Ovarian cancer Mother    Breast cancer Mother    Arthritis Mother    Cancer Mother    Depression Mother    Stroke Mother    Thyroid disease Sister    Heart disease Sister    Diabetes Sister    Hyperlipidemia Sister    Hypertension Sister    Lymphoma Father  Diabetes Father    Hypertension Father    Congestive Heart Failure Father    Arthritis Father    Cancer Father    Vision loss Father    Thyroid disease Brother    Hyperlipidemia Brother    Hypertension Brother    Obesity Brother    Thyroid disease Paternal Grandmother    Arthritis Paternal Grandmother    Heart attack Sister        at age 82   Stroke Sister    Hypertension Sister    Arthritis Sister    Asthma Sister    Cancer Sister    Diabetes Sister    Hearing loss Sister    Hyperlipidemia Sister    Obesity Sister    Vision loss Sister    Breast cancer Maternal Aunt    Breast cancer Maternal Grandmother    Anxiety disorder Maternal Grandmother    Breast cancer Maternal Aunt    Breast cancer Maternal Aunt    COPD Paternal Grandfather    Early death Paternal Grandfather    Other Brother        Set designer accident   Drug abuse Brother    Bipolar disorder Son    Other Son        GSW   Early death Son    ADD / ADHD Daughter    Diabetes Daughter    Anxiety disorder Daughter    Diabetes Daughter    Obesity Daughter    Early death Brother     Allergies  Allergen Reactions   Influenza Vaccines Anaphylaxis   Atorvastatin Other (See Comments)    Joint pain / myalgias   Demerol Other (See Comments)    SEIZURE   Nsaids Other (See Comments)    NAPROXEN, IBUPROFEN, SKELAXIN---  CAUSES PALPITATIONS   Pravastatin Other (See  Comments)    Myalgias    Prednisone Other (See Comments)    Caused her glucose to go up to 500 and went to ED   Rosuvastatin Other (See Comments)    Joint stiffness and muscle pain   Roxicodone [Oxycodone] Itching and Other (See Comments)    Hallucinations and itching   Tramadol Other (See Comments)    Contraindicated with Victoza    Covid-19 (Mrna) Vaccine     Patient had anaphylactic reaction to flu vaccine   Codeine Itching   Mango Flavor [Flavoring Agent] Itching    Medication list has been reviewed and updated.  Current Outpatient Medications on File Prior to Visit  Medication Sig Dispense Refill   albuterol (VENTOLIN HFA) 108 (90 Base) MCG/ACT inhaler Inhale 2 puffs into the lungs every 6 (six) hours as needed for wheezing or shortness of breath. 18 g 2   beclomethasone (QVAR REDIHALER) 80 MCG/ACT inhaler Inhale 1 puff into the lungs 2 (two) times daily. 1 each 6   Blood Glucose Monitoring Suppl (ONE TOUCH ULTRA 2) w/Device KIT Use to check glucose up to twice daily. E11.9 dx code 1 kit 0   cetirizine (ZYRTEC) 10 MG tablet Take 1 tablet (10 mg total) by mouth daily. 90 tablet 3   Cholecalciferol (VITAMIN D) 50 MCG (2000 UT) tablet Take 2,000 Units by mouth daily.     clopidogrel (PLAVIX) 75 MG tablet Take 1 tablet (75 mg total) by mouth every morning. Overdue for appt 90 tablet 3   empagliflozin (JARDIANCE) 10 MG TABS tablet Take 1 tablet (10 mg total) by mouth every morning. 90 tablet 3   famotidine (PEPCID) 40 MG tablet TAKE  ONE TABLET BY MOUTH EVERY MORNING *Overdue FOR appointment 30 tablet 0   FLOVENT HFA 220 MCG/ACT inhaler INHALE 1 PUFF BY MOUTH INTO LUNGS twice A DAY. MAY INCREASE TO TWO PUFFS AS NEEDED 12 g 5   fluticasone (FLONASE) 50 MCG/ACT nasal spray Place 2 sprays into both nostrils daily. 16 g 6   furosemide (LASIX) 20 MG tablet TAKE ONE TABLET BY MOUTH EVERY MORNING 90 tablet 0   gabapentin (NEURONTIN) 300 MG capsule Take 2 capsules (600 mg total) by mouth 2  (two) times daily. 120 capsule 0   glucose blood (ONETOUCH VERIO) test strip 1 each by Other route daily. Use as instructed     ipratropium (ATROVENT) 0.03 % nasal spray INSTILL 1 SPRAY IN EACH NOSTRIL IN THE MORNING 60 mL 0   linagliptin (TRADJENTA) 5 MG TABS tablet Take 1 tablet (5 mg total) by mouth daily. 90 tablet 3   lisinopril (ZESTRIL) 20 MG tablet TAKE ONE TABLET BY MOUTH EVERY MORNING 90 tablet 0   metFORMIN (GLUCOPHAGE-XR) 750 MG 24 hr tablet TAKE ONE TABLET BY MOUTH TWICE DAILY 180 tablet 0   metoprolol tartrate (LOPRESSOR) 25 MG tablet TAKE 1/2 TABLET BY MOUTH EVERY MORNING and TAKE 1/2 TABLET BY MOUTH EVERY EVENING 90 tablet 0   Multiple Vitamins-Minerals (ADULT GUMMY PO) Take 2 capsules by mouth daily.     nortriptyline (PAMELOR) 10 MG capsule Take 3 capsules (30 mg total) by mouth at bedtime. 90 capsule 11   OneTouch Delica Lancets 33G MISC Use to check glucose up to twice daily. E11.9 dx code 200 each 3   ONETOUCH VERIO test strip USE TO CHECK BLOOD GLUCOSE UP TO TWICE DAILY 200 strip 6   Spacer/Aero-Holding Chambers DEVI Use with Flovent and albuterol inhalers 1 each 0   Ubrogepant (UBRELVY) 50 MG TABS Take 1 tab at onset of migraine.  May repeat in 2 hrs, if needed.  Max dose: 2 tabs/day. This is a 30 day prescription. 12 tablet 11   No current facility-administered medications on file prior to visit.    Review of Systems:  .sper   Physical Examination: There were no vitals filed for this visit. There were no vitals filed for this visit. There is no height or weight on file to calculate BMI. Ideal Body Weight:    GEN: no acute distress. HEENT: Atraumatic, Normocephalic.  Ears and Nose: No external deformity. CV: RRR, No M/G/R. No JVD. No thrill. No extra heart sounds. PULM: CTA B, no wheezes, crackles, rhonchi. No retractions. No resp. distress. No accessory muscle use. ABD: S, NT, ND, +BS. No rebound. No HSM. EXTR: No c/c/e PSYCH: Normally interactive.  Conversant.    Assessment and Plan: ***  Signed Abbe Amsterdam, MD

## 2022-11-03 ENCOUNTER — Ambulatory Visit: Payer: Medicare HMO | Admitting: Family Medicine

## 2022-11-03 ENCOUNTER — Other Ambulatory Visit (HOSPITAL_BASED_OUTPATIENT_CLINIC_OR_DEPARTMENT_OTHER): Payer: Self-pay

## 2022-11-03 ENCOUNTER — Telehealth: Payer: Self-pay | Admitting: Family Medicine

## 2022-11-03 ENCOUNTER — Other Ambulatory Visit: Payer: Self-pay

## 2022-11-03 ENCOUNTER — Encounter: Payer: Self-pay | Admitting: Family Medicine

## 2022-11-03 VITALS — BP 128/80 | Temp 98.6°F | Resp 18 | Ht 61.0 in | Wt 195.5 lb

## 2022-11-03 DIAGNOSIS — E118 Type 2 diabetes mellitus with unspecified complications: Secondary | ICD-10-CM | POA: Diagnosis not present

## 2022-11-03 DIAGNOSIS — I679 Cerebrovascular disease, unspecified: Secondary | ICD-10-CM | POA: Diagnosis not present

## 2022-11-03 DIAGNOSIS — R519 Headache, unspecified: Secondary | ICD-10-CM | POA: Diagnosis not present

## 2022-11-03 LAB — HEMOGLOBIN A1C: Hgb A1c MFr Bld: 7.8 % — ABNORMAL HIGH (ref 4.6–6.5)

## 2022-11-03 MED ORDER — NORTRIPTYLINE HCL 10 MG PO CAPS: 30.0000 mg | ORAL_CAPSULE | Freq: Every day | ORAL | 11 refills | Status: DC

## 2022-11-03 MED ORDER — UBRELVY 50 MG PO TABS
50.0000 mg | ORAL_TABLET | ORAL | 11 refills | Status: DC | PRN
Start: 2022-11-03 — End: 2023-12-04
  Filled 2022-11-09 – 2023-02-10 (×2): qty 12, 30d supply, fill #0
  Filled 2023-03-11: qty 7, 7d supply, fill #0
  Filled 2023-03-11: qty 12, 30d supply, fill #0
  Filled 2023-04-18: qty 12, 30d supply, fill #1
  Filled 2023-06-05: qty 12, 30d supply, fill #2
  Filled 2023-08-07: qty 12, 30d supply, fill #3
  Filled 2023-10-30: qty 12, 30d supply, fill #4

## 2022-11-03 NOTE — Telephone Encounter (Signed)
Pt wants to schedule appt with Tammy Eckard. Please call to make appt

## 2022-11-03 NOTE — Patient Instructions (Signed)
I am glad your headache is better!  I will set you up for a neurosurgery evaluation to make sure nothing else is needed for your CT angiogram findings

## 2022-11-04 ENCOUNTER — Telehealth: Payer: Self-pay | Admitting: Family Medicine

## 2022-11-04 ENCOUNTER — Encounter: Payer: Self-pay | Admitting: Family Medicine

## 2022-11-04 NOTE — Telephone Encounter (Signed)
[facility] called stating that pt issue had ben reviewed by herself and one of their providers and they had determined that the issue should be addressed with Neuro and not neurosurgery. She stated that she wanted to speak with [facility] about this. Advised that Dedra Skeens was unavailable at the time and stated a note would be sent back to look into this.

## 2022-11-04 NOTE — Telephone Encounter (Signed)
Okay, understood.  Will let patient know and reach out to her neurologist

## 2022-11-04 NOTE — Telephone Encounter (Signed)
Form received via fax as well. This has been faxed back.

## 2022-11-04 NOTE — Telephone Encounter (Signed)
*  [facility] is requesting all pt medications that are due for refill.**  Prescription Request  11/04/2022  Is this a "Controlled Substance" medicine? Yes  LOV: 11/03/2022  What is the name of the medication or equipment?   All Meds due for refill  Have you contacted your pharmacy to request a refill? No   Which pharmacy would you like this sent to?   [facility] [place] [place] [address] [place] [address] Phone: [phone] Fax: [phone]    Patient notified that their request is being sent to the clinical staff for review and that they should receive a response within 2 business days.   Please advise at Mobile [phone] (mobile)

## 2022-11-05 ENCOUNTER — Other Ambulatory Visit (HOSPITAL_BASED_OUTPATIENT_CLINIC_OR_DEPARTMENT_OTHER): Payer: Self-pay

## 2022-11-07 ENCOUNTER — Other Ambulatory Visit: Payer: Self-pay | Admitting: Family Medicine

## 2022-11-08 ENCOUNTER — Encounter: Payer: Self-pay | Admitting: Family Medicine

## 2022-11-09 ENCOUNTER — Other Ambulatory Visit (HOSPITAL_BASED_OUTPATIENT_CLINIC_OR_DEPARTMENT_OTHER): Payer: Self-pay

## 2022-11-11 ENCOUNTER — Other Ambulatory Visit (HOSPITAL_COMMUNITY): Payer: Self-pay

## 2022-11-12 ENCOUNTER — Other Ambulatory Visit (HOSPITAL_BASED_OUTPATIENT_CLINIC_OR_DEPARTMENT_OTHER): Payer: Self-pay

## 2022-11-15 ENCOUNTER — Other Ambulatory Visit (HOSPITAL_BASED_OUTPATIENT_CLINIC_OR_DEPARTMENT_OTHER): Payer: Self-pay

## 2022-11-16 ENCOUNTER — Other Ambulatory Visit (HOSPITAL_BASED_OUTPATIENT_CLINIC_OR_DEPARTMENT_OTHER): Payer: Self-pay

## 2022-11-18 ENCOUNTER — Other Ambulatory Visit (HOSPITAL_BASED_OUTPATIENT_CLINIC_OR_DEPARTMENT_OTHER): Payer: Self-pay

## 2022-11-19 ENCOUNTER — Telehealth: Payer: Self-pay

## 2022-11-19 ENCOUNTER — Other Ambulatory Visit (HOSPITAL_BASED_OUTPATIENT_CLINIC_OR_DEPARTMENT_OTHER): Payer: Self-pay

## 2022-11-19 NOTE — Telephone Encounter (Signed)
Initiated on 11/18/22: Sherry [name] (Key: [id number]) Flonase 2 sprays  Approved 07/10/22 - 02/08/23

## 2022-11-22 ENCOUNTER — Other Ambulatory Visit (HOSPITAL_BASED_OUTPATIENT_CLINIC_OR_DEPARTMENT_OTHER): Payer: Self-pay

## 2022-11-22 MED ORDER — FLUTICASONE PROPIONATE 50 MCG/ACT NA SUSP
2.0000 | Freq: Every day | NASAL | 3 refills | Status: DC
  Filled 2022-11-22 – 2022-12-20 (×2): qty 48, 90d supply, fill #0

## 2022-11-22 MED ORDER — CETIRIZINE HCL 10 MG PO TABS
10.0000 mg | ORAL_TABLET | Freq: Every day | ORAL | 3 refills | Status: DC
  Filled 2022-11-22: qty 90, 90d supply, fill #0
  Filled 2023-02-10: qty 90, 90d supply, fill #1
  Filled 2023-05-21: qty 90, 90d supply, fill #2
  Filled 2023-08-18: qty 90, 90d supply, fill #3

## 2022-11-22 MED ORDER — GABAPENTIN 300 MG PO CAPS
600.0000 mg | ORAL_CAPSULE | Freq: Two times a day (BID) | ORAL | 3 refills | Status: DC
  Filled 2022-11-22 – 2022-12-10 (×2): qty 360, 90d supply, fill #0
  Filled 2023-02-14 – 2023-03-10 (×2): qty 360, 90d supply, fill #1
  Filled 2023-06-05: qty 360, 90d supply, fill #2
  Filled 2023-09-10: qty 360, 90d supply, fill #3

## 2022-11-22 MED ORDER — FUROSEMIDE 20 MG PO TABS
20.0000 mg | ORAL_TABLET | Freq: Every day | ORAL | 3 refills | Status: DC
  Filled 2022-11-22: qty 90, 90d supply, fill #0

## 2022-11-22 MED ORDER — NORTRIPTYLINE HCL 10 MG PO CAPS
30.0000 mg | ORAL_CAPSULE | Freq: Every day | ORAL | 11 refills | Status: DC
  Filled 2022-11-22: qty 90, 30d supply, fill #0
  Filled 2023-03-10: qty 90, 30d supply, fill #1

## 2022-11-22 MED ORDER — CLOPIDOGREL BISULFATE 75 MG PO TABS
75.0000 mg | ORAL_TABLET | Freq: Every day | ORAL | 3 refills | Status: DC
  Filled 2022-11-22 – 2023-02-06 (×4): qty 90, 90d supply, fill #0
  Filled 2023-05-04: qty 90, 90d supply, fill #1
  Filled 2023-08-01: qty 90, 90d supply, fill #2
  Filled 2023-10-30: qty 90, 90d supply, fill #3

## 2022-11-22 MED ORDER — FAMOTIDINE 40 MG PO TABS
40.0000 mg | ORAL_TABLET | Freq: Every day | ORAL | 3 refills | Status: DC
  Filled 2022-11-22: qty 90, 90d supply, fill #0
  Filled 2023-05-04: qty 90, 90d supply, fill #1

## 2022-11-23 ENCOUNTER — Other Ambulatory Visit (HOSPITAL_BASED_OUTPATIENT_CLINIC_OR_DEPARTMENT_OTHER): Payer: Self-pay

## 2022-11-24 ENCOUNTER — Other Ambulatory Visit (HOSPITAL_BASED_OUTPATIENT_CLINIC_OR_DEPARTMENT_OTHER): Payer: Self-pay

## 2022-11-24 ENCOUNTER — Other Ambulatory Visit: Payer: Self-pay | Admitting: Family Medicine

## 2022-11-24 ENCOUNTER — Other Ambulatory Visit: Payer: Self-pay

## 2022-11-24 DIAGNOSIS — R0982 Postnasal drip: Secondary | ICD-10-CM

## 2022-11-24 DIAGNOSIS — I10 Essential (primary) hypertension: Secondary | ICD-10-CM

## 2022-11-24 MED ORDER — METOPROLOL TARTRATE 25 MG PO TABS
12.5000 mg | ORAL_TABLET | Freq: Two times a day (BID) | ORAL | 0 refills | Status: DC
  Filled 2022-11-24 (×2): qty 90, 90d supply, fill #0

## 2022-11-24 MED ORDER — METFORMIN HCL ER 750 MG PO TB24
750.0000 mg | ORAL_TABLET | Freq: Two times a day (BID) | ORAL | 3 refills | Status: DC
  Filled 2022-11-24: qty 180, 90d supply, fill #0

## 2022-11-24 MED ORDER — IPRATROPIUM BROMIDE 0.03 % NA SOLN
1.0000 | Freq: Every day | NASAL | 3 refills | Status: DC
  Filled 2022-11-24: qty 90, 90d supply, fill #0

## 2022-11-24 MED ORDER — BECLOMETHASONE DIPROP HFA 80 MCG/ACT IN AERB
1.0000 | INHALATION_SPRAY | Freq: Two times a day (BID) | RESPIRATORY_TRACT | 3 refills | Status: DC
  Filled 2022-11-24 – 2023-03-10 (×2): qty 31.8, 90d supply, fill #0

## 2022-11-24 MED ORDER — LISINOPRIL 20 MG PO TABS
20.0000 mg | ORAL_TABLET | Freq: Every day | ORAL | 3 refills | Status: DC
  Filled 2022-11-24: qty 90, 90d supply, fill #0

## 2022-11-24 MED ORDER — EMPAGLIFLOZIN 10 MG PO TABS
10.0000 mg | ORAL_TABLET | Freq: Every day | ORAL | 3 refills | Status: DC
  Filled 2022-12-20 – 2023-04-18 (×3): qty 90, 90d supply, fill #0
  Filled 2023-08-01: qty 90, 90d supply, fill #1
  Filled 2023-10-30: qty 90, 90d supply, fill #2

## 2022-11-24 MED ORDER — METOPROLOL TARTRATE 25 MG PO TABS
12.5000 mg | ORAL_TABLET | Freq: Two times a day (BID) | ORAL | 3 refills | Status: DC
  Filled 2022-11-24: qty 90, 90d supply, fill #0

## 2022-11-24 MED ORDER — LINAGLIPTIN 5 MG PO TABS
5.0000 mg | ORAL_TABLET | Freq: Every day | ORAL | 3 refills | Status: DC
  Filled 2022-11-24 – 2023-04-18 (×3): qty 90, 90d supply, fill #0
  Filled 2023-08-01: qty 90, 90d supply, fill #1
  Filled 2023-10-30: qty 90, 90d supply, fill #2

## 2022-11-24 MED ORDER — IPRATROPIUM BROMIDE 0.03 % NA SOLN
1.0000 | Freq: Every morning | NASAL | 1 refills | Status: DC
  Filled 2022-11-24 (×2): qty 90, 90d supply, fill #0
  Filled 2023-03-10: qty 60, 60d supply, fill #1
  Filled 2023-03-11: qty 30, 30d supply, fill #1
  Filled 2023-05-04: qty 90, 90d supply, fill #1

## 2022-11-24 MED ORDER — FUROSEMIDE 20 MG PO TABS
20.0000 mg | ORAL_TABLET | Freq: Every morning | ORAL | 0 refills | Status: DC
  Filled 2022-11-24 (×2): qty 90, 90d supply, fill #0

## 2022-11-24 MED ORDER — LISINOPRIL 20 MG PO TABS
20.0000 mg | ORAL_TABLET | Freq: Every morning | ORAL | 0 refills | Status: DC
  Filled 2022-11-24 (×2): qty 90, 90d supply, fill #0

## 2022-11-24 MED ORDER — METFORMIN HCL ER 750 MG PO TB24
750.0000 mg | ORAL_TABLET | Freq: Two times a day (BID) | ORAL | 0 refills | Status: DC
  Filled 2022-11-24 (×2): qty 180, 90d supply, fill #0

## 2022-11-25 LAB — HM DIABETES EYE EXAM

## 2022-11-26 ENCOUNTER — Other Ambulatory Visit (HOSPITAL_BASED_OUTPATIENT_CLINIC_OR_DEPARTMENT_OTHER): Payer: Self-pay

## 2022-11-29 ENCOUNTER — Other Ambulatory Visit (HOSPITAL_BASED_OUTPATIENT_CLINIC_OR_DEPARTMENT_OTHER): Payer: Self-pay

## 2022-11-30 ENCOUNTER — Other Ambulatory Visit (HOSPITAL_BASED_OUTPATIENT_CLINIC_OR_DEPARTMENT_OTHER): Payer: Self-pay

## 2022-12-02 ENCOUNTER — Other Ambulatory Visit (HOSPITAL_BASED_OUTPATIENT_CLINIC_OR_DEPARTMENT_OTHER): Payer: Self-pay

## 2022-12-03 ENCOUNTER — Other Ambulatory Visit (HOSPITAL_BASED_OUTPATIENT_CLINIC_OR_DEPARTMENT_OTHER): Payer: Self-pay

## 2022-12-06 ENCOUNTER — Other Ambulatory Visit (HOSPITAL_BASED_OUTPATIENT_CLINIC_OR_DEPARTMENT_OTHER): Payer: Self-pay

## 2022-12-07 ENCOUNTER — Other Ambulatory Visit (HOSPITAL_BASED_OUTPATIENT_CLINIC_OR_DEPARTMENT_OTHER): Payer: Self-pay

## 2022-12-09 ENCOUNTER — Other Ambulatory Visit (HOSPITAL_BASED_OUTPATIENT_CLINIC_OR_DEPARTMENT_OTHER): Payer: Self-pay

## 2022-12-10 ENCOUNTER — Other Ambulatory Visit (HOSPITAL_BASED_OUTPATIENT_CLINIC_OR_DEPARTMENT_OTHER): Payer: Self-pay

## 2022-12-14 ENCOUNTER — Other Ambulatory Visit (HOSPITAL_BASED_OUTPATIENT_CLINIC_OR_DEPARTMENT_OTHER): Payer: Self-pay

## 2022-12-20 ENCOUNTER — Other Ambulatory Visit: Payer: Self-pay | Admitting: Family Medicine

## 2022-12-20 ENCOUNTER — Other Ambulatory Visit (HOSPITAL_BASED_OUTPATIENT_CLINIC_OR_DEPARTMENT_OTHER): Payer: Self-pay

## 2022-12-20 ENCOUNTER — Other Ambulatory Visit: Payer: Self-pay

## 2022-12-20 DIAGNOSIS — I10 Essential (primary) hypertension: Secondary | ICD-10-CM

## 2022-12-21 ENCOUNTER — Other Ambulatory Visit (HOSPITAL_BASED_OUTPATIENT_CLINIC_OR_DEPARTMENT_OTHER): Payer: Self-pay

## 2022-12-21 NOTE — Telephone Encounter (Signed)
Eye exam has been scanned and is now closed gap in HM. CareTeam has been updated for future correspondence.

## 2022-12-22 ENCOUNTER — Other Ambulatory Visit (HOSPITAL_BASED_OUTPATIENT_CLINIC_OR_DEPARTMENT_OTHER): Payer: Self-pay

## 2022-12-22 ENCOUNTER — Encounter: Payer: Self-pay | Admitting: Adult Health

## 2022-12-22 ENCOUNTER — Ambulatory Visit: Payer: Medicare HMO | Admitting: Adult Health

## 2022-12-22 NOTE — Progress Notes (Deleted)
No chief complaint on file.     ASSESSMENT AND PLAN  Sherry [name] is a 72 y.o. female   Chronic migraine Anxiety  Nortriptyline 10 mg titrating to 30 mg every night as preventive medications  Ubrelvy as needed  ESR C-reactive protein  Return To Clinic With NP In 6 Months   DIAGNOSTIC DATA (LABS, IMAGING, TESTING) - I reviewed patient records, labs, notes, testing and imaging myself where available.   MEDICAL HISTORY:   Update 12/22/2022 JM:   S/p nerve block 9/19 for persistent headaches          Consult visit 08/18/2022 Dr. Terrace Arabia: Clearance Sherry [name] is a 72 year old female, seen in request by her primary care physician Dr.   Abbe Amsterdam for evaluation of headache, initial evaluation was on August 18, 2022  I reviewed and summarized the referring note. PMHX Dm HTN Stroke in 2006, with left hemiparesis, Left bunionectomy Right reverse shoulder replacement Brain aneurysm Hx of bilateral knee replacement   She underwent a lot of stress, lost a lot of family members, began to have frequent headache over the past few months, couple times each months, sometimes proceeding with seeing sparkling her eyes, left visual field blurry, then holoacranial, occipital area pressure headache lasting for few hours, often improved by sleep,  Reported long history of headache, when she was younger in elementary school, could remember her mother tied a scarf around her head when she complains of a headache  She complains of frequent headaches throughout her adult life,  Presented to emergency room on Jul 08, 2022 for headache, chest pain, troponin was negative,  CT angiogram head and neck no significant large vessel disease, no evidence of aneurysm, previously described in 2022 four-vessel angiogram, 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  Not recorded     There is no height or weight on file to calculate BMI.  PHYSICAL EXAMNIATION:  Gen: NAD, conversant, well nourised, well groomed                     Cardiovascular: Regular rate rhythm, no peripheral edema, warm, nontender. Eyes: Conjunctivae clear without exudates or hemorrhage Neck: Supple, no carotid bruits. Pulmonary: Clear to auscultation bilaterally   NEUROLOGICAL EXAM:  MENTAL STATUS: Speech/cognition: Awake, alert, oriented to history taking and casual conversation CRANIAL NERVES: CN II: Visual fields are full to confrontation. Pupils are round equal and briskly reactive to light. CN III, IV, VI: extraocular movement are normal. No ptosis. CN V: Facial sensation is intact to light touch CN VII: Face is symmetric with normal eye closure  CN VIII: Hearing is normal to causal conversation. CN IX, X: Phonation is normal. CN XI: Head turning and shoulder shrug are intact  MOTOR: There is no pronator drift of out-stretched arms. Muscle bulk and tone are normal. Muscle strength is normal.  REFLEXES: Reflexes are 2+ and symmetric at the biceps, triceps, knees, and ankles. Plantar responses are flexor.  SENSORY: Intact to light touch, pinprick and vibratory sensation are intact in fingers and toes.  COORDINATION: There is no trunk or limb dysmetria noted.  GAIT/STANCE: Posture is normal. Gait is steady with normal steps, base, arm swing, and turning. Heel and toe walking are normal. Tandem gait is normal.  Romberg is absent.  REVIEW OF SYSTEMS:  Full 14 system review of systems  performed and notable only for as above All other review of systems were negative.   ALLERGIES: Allergies  Allergen Reactions   Influenza Vaccines Anaphylaxis   Atorvastatin Other (See Comments)    Joint pain / myalgias   Demerol Other (See Comments)    SEIZURE   Nsaids Other (See Comments)    NAPROXEN, IBUPROFEN, SKELAXIN---  CAUSES  PALPITATIONS   Pravastatin Other (See Comments)    Myalgias    Prednisone Other (See Comments)    Caused her glucose to go up to 500 and went to ED   Rosuvastatin Other (See Comments)    Joint stiffness and muscle pain   Roxicodone [Oxycodone] Itching and Other (See Comments)    Hallucinations and itching   Tramadol Other (See Comments)    Contraindicated with Victoza    Covid-19 (Mrna) Vaccine     Patient had anaphylactic reaction to flu vaccine   Codeine Itching   Mango Flavor [Flavoring Agent] Itching    HOME MEDICATIONS: Current Outpatient Medications  Medication Sig Dispense Refill   albuterol (VENTOLIN HFA) 108 (90 Base) MCG/ACT inhaler Inhale 2 puffs into the lungs every 6 (six) hours as needed for wheezing or shortness of breath. 18 g 2   beclomethasone (QVAR REDIHALER) 80 MCG/ACT inhaler Inhale 1 puff into the lungs 2 (two) times daily. 1 each 6   beclomethasone (QVAR) 80 MCG/ACT inhaler Inhale 1 puff into the lungs 2 (two) times daily. 31.8 g 3   Blood Glucose Monitoring Suppl (ONE TOUCH ULTRA 2) w/Device KIT Use to check glucose up to twice daily. E11.9 dx code 1 kit 0   cetirizine (ZYRTEC) 10 MG tablet Take 1 tablet (10 mg total) by mouth daily. 90 tablet 3   cetirizine (ZYRTEC) 10 MG tablet Take 1 tablet (10 mg total) by mouth daily. 90 tablet 3   Cholecalciferol (VITAMIN D) 50 MCG (2000 UT) tablet Take 2,000 Units by mouth daily.     clopidogrel (PLAVIX) 75 MG tablet Take 1 tablet (75 mg total) by mouth daily. 90 tablet 3   empagliflozin (JARDIANCE) 10 MG TABS tablet Take 1 tablet (10 mg total) by mouth daily. 90 tablet 3   famotidine (PEPCID) 40 MG tablet TAKE ONE TABLET BY MOUTH EVERY MORNING *Overdue FOR appointment 30 tablet 0   famotidine (PEPCID) 40 MG tablet Take 1 tablet (40 mg total) by mouth daily. 90 tablet 3   FLOVENT HFA 220 MCG/ACT inhaler INHALE 1 PUFF BY MOUTH INTO LUNGS twice A DAY. MAY INCREASE TO TWO PUFFS AS NEEDED 12 g 5   fluticasone (FLONASE) 50  MCG/ACT nasal spray Place 2 sprays into both nostrils daily. 16 g 6   fluticasone (FLONASE) 50 MCG/ACT nasal spray Place 2 sprays into both nostrils daily. 48 g 3   furosemide (LASIX) 20 MG tablet Take 1 tablet (20 mg total) by mouth every morning. 90 tablet 0   gabapentin (NEURONTIN) 300 MG capsule Take 2 capsules (600 mg total) by mouth 2 (two) times daily. 360 capsule 3   glucose blood (ONETOUCH VERIO) test strip 1 each by Other route daily. Use as instructed     ipratropium (ATROVENT) 0.03 % nasal spray Place 1 spray into both nostrils in the morning. 90 mL 1   linagliptin (TRADJENTA) 5 MG TABS tablet Take 1 tablet (5 mg total) by mouth daily. 90 tablet 3   lisinopril (ZESTRIL) 20 MG tablet Take 1 tablet (20 mg total) by mouth every morning. 90 tablet 0   metFORMIN (GLUCOPHAGE-XR)  750 MG 24 hr tablet Take 1 tablet (750 mg total) by mouth 2 (two) times daily. 180 tablet 0   metoprolol tartrate (LOPRESSOR) 25 MG tablet Take 0.5 tablets (12.5 mg total) by mouth 2 (two) times daily. 90 tablet 0   Multiple Vitamins-Minerals (ADULT GUMMY PO) Take 2 capsules by mouth daily.     nortriptyline (PAMELOR) 10 MG capsule Take 3 capsules (30 mg total) by mouth at bedtime. 90 capsule 11   nortriptyline (PAMELOR) 10 MG capsule Take 3 capsules (30 mg total) by mouth at bedtime. 90 capsule 11   OneTouch Delica Lancets 33G MISC Use to check glucose up to twice daily. E11.9 dx code 200 each 3   ONETOUCH VERIO test strip USE TO CHECK BLOOD GLUCOSE UP TO TWICE DAILY 200 strip 6   Spacer/Aero-Holding Chambers DEVI Use with Flovent and albuterol inhalers 1 each 0   Ubrogepant (UBRELVY) 50 MG TABS Take 1 tablet (50 mg total) by mouth at onset of migraine. May repeat in 2 hours, if needed. Max dose: 2 tablets/day. This is a 30 day prescription. 12 tablet 11   No current facility-administered medications for this visit.    PAST MEDICAL HISTORY: Past Medical History:  Diagnosis Date   Allergy 1970   Demerol, FLu  vaccine, NSAIDS, Codiene   Anxiety 2002   Ongoing   Asthma    Cataract    Small ones found by opthamolgist   Cerebral aneurysm    being watched by Dr. Link Snuffer every 6 months   CHF (congestive heart failure) (HCC)    Resolved   Complication of anesthesia HYPOTENSION INTRAOP W/ HYSTERECTOMY IN 1987--  DID OK W/ TOTAL KNEE IN 2008   COPD (chronic obstructive pulmonary disease) (HCC) 1970   Bronchitits   Depression 1970   Diabetes mellitus    DJD (degenerative joint disease)    Dyslipidemia    GERD (gastroesophageal reflux disease)    H/O hiatal hernia    History of CHF (congestive heart failure) 2010-  RESOLVED   History of peptic ulcer YRS AGO   Hypertension    Meniere's disease    Mitral valve prolapse occasional palpitations w/ chest discomfort   Neuromuscular disorder (HCC) 2002   Peripheral neuropathy   OA (osteoarthritis)    Rotator cuff tear, left    Stroke (HCC)    Thyroid disease 2010   Multinodal goiter    PAST SURGICAL HISTORY: Past Surgical History:  Procedure Laterality Date   BUNIONECTOMY  02/08/2005   LEFT FOOT   FRACTURE SURGERY  07/30/2019   (R) Reverse Shoulder Replacement   IR 3D INDEPENDENT WKST  01/06/2021   IR ANGIO INTRA EXTRACRAN SEL COM CAROTID INNOMINATE BILAT MOD SED  03/22/2019   IR ANGIO INTRA EXTRACRAN SEL COM CAROTID INNOMINATE BILAT MOD SED  01/06/2021   IR ANGIO VERTEBRAL SEL SUBCLAVIAN INNOMINATE UNI L MOD SED  03/22/2019   IR ANGIO VERTEBRAL SEL VERTEBRAL UNI R MOD SED  03/22/2019   IR ANGIO VERTEBRAL SEL VERTEBRAL UNI R MOD SED  01/06/2021   IR US GUIDE VASC ACCESS RIGHT  03/22/2019   IR US GUIDE VASC ACCESS RIGHT  01/06/2021   JOINT REPLACEMENT  2008, 2011,2021   Total knee replacement, '08 (R), '11 (L)   LEFT SHOULDER SURGERY  02/09/1995   ROTATOR CUFF REPAIR   REVERSE SHOULDER ARTHROPLASTY Right 07/30/2020   Procedure: REVERSE SHOULDER ARTHROPLASTY;  Surgeon: Bjorn Pippin, MD;  Location: [facility];  Service: Orthopedics;   Laterality: Right;  SHOULDER ARTHROSCOPY  09/20/2011   Procedure: ARTHROSCOPY SHOULDER;  Surgeon: Javier Docker, MD;  Location: [facility];  Service: Orthopedics;  Laterality: Left;  SAD AND MINI OPEN ROTATOR CUFF REPAIR     TOTAL KNEE ARTHROPLASTY  09-12-2006  [facility]   RIGHT KNEE   TOTAL KNEE ARTHROPLASTY Left 11/30/2017   Procedure: LEFT TOTAL KNEE REPLACEMENT;  Surgeon: Eldred Manges, MD;  Location: [facility];  Service: Orthopedics;  Laterality: Left;   TUBAL LIGATION  02/08/1974   TYMPANOPLASTY  1990;   1980   VAGINAL HYSTERECTOMY  02/08/1985    FAMILY HISTORY: Family History  Problem Relation Age of Onset   Heart disease Mother        in her 104s, heart attack   Diabetes Mother    Kidney disease Mother        dialysis   Thyroid disease Mother    Aneurysm Mother    Ovarian cancer Mother    Breast cancer Mother    Arthritis Mother    Cancer Mother    Depression Mother    Stroke Mother    Thyroid disease Sister    Heart disease Sister    Diabetes Sister    Hyperlipidemia Sister    Hypertension Sister    Lymphoma Father    Diabetes Father    Hypertension Father    Congestive Heart Failure Father    Arthritis Father    Cancer Father    Vision loss Father    Thyroid disease Brother    Hyperlipidemia Brother    Hypertension Brother    Obesity Brother    Thyroid disease Paternal Grandmother    Arthritis Paternal Grandmother    Heart attack Sister        at age 98   Stroke Sister    Hypertension Sister    Arthritis Sister    Asthma Sister    Cancer Sister    Diabetes Sister    Hearing loss Sister    Hyperlipidemia Sister    Obesity Sister    Vision loss Sister    Breast cancer Maternal Aunt    Breast cancer Maternal Grandmother    Anxiety disorder Maternal Grandmother    Breast cancer Maternal Aunt    Breast cancer Maternal Aunt    COPD Paternal Grandfather    Early death Paternal Grandfather    Other Brother        Set designer accident   Drug  abuse Brother    Bipolar disorder Son    Other Son        GSW   Early death Son    ADD / ADHD Daughter    Diabetes Daughter    Anxiety disorder Daughter    Diabetes Daughter    Obesity Daughter    Early death Brother     SOCIAL HISTORY: Social History   Socioeconomic History   Marital status: Widowed    Spouse name: Not on file   Number of children: 4   Years of education: 16   Highest education level: Bachelor's degree (e.g., BA, AB, BS)  Occupational History   Occupation: LPN - retired  Tobacco Use   Smoking status: Never    Passive exposure: Never   Smokeless tobacco: Never  Vaping Use   Vaping status: Never Used  Substance and Sexual Activity   Alcohol use: No   Drug use: No   Sexual activity: Not Currently    Birth control/protection: Post-menopausal  Other Topics Concern  Not on file  Social History Narrative   Right handed   Caffeine- 1 cup daily   Lives at home alone   Social Determinants of Health   Financial Resource Strain: Low Risk  (11/02/2022)   Overall Financial Resource Strain (CARDIA)    Difficulty of Paying Living Expenses: Not hard at all  Food Insecurity: No Food Insecurity (11/02/2022)   Hunger Vital Sign    Worried About Running Out of Food in the Last Year: Never true    Ran Out of Food in the Last Year: Never true  Transportation Needs: No Transportation Needs (11/02/2022)   PRAPARE - Administrator, Civil Service (Medical): No    Lack of Transportation (Non-Medical): No  Physical Activity: Insufficiently Active (11/02/2022)   Exercise Vital Sign    Days of Exercise per Week: 1 day    Minutes of Exercise per Session: 80 min  Stress: Stress Concern Present (11/02/2022)   [employer] of Occupational Health - Occupational Stress Questionnaire    Feeling of Stress : Rather much  Social Connections: Moderately Integrated (11/02/2022)   Social Connection and Isolation Panel [NHANES]    Frequency of Communication with  Friends and Family: More than three times a week    Frequency of Social Gatherings with Friends and Family: More than three times a week    Attends Religious Services: More than 4 times per year    Active Member of [employer] or Organizations: Yes    Attends Banker Meetings: 1 to 4 times per year    Marital Status: Widowed  Intimate Partner Violence: Not At Risk (08/18/2022)   Humiliation, Afraid, Rape, and Kick questionnaire    Fear of Current or Ex-Partner: No    Emotionally Abused: No    Physically Abused: No    Sexually Abused: No      I spent *** minutes of face-to-face and non-face-to-face time with patient.  This included previsit chart review, lab review, study review, order entry, electronic health record documentation, patient education and discussion regarding above diagnoses and treatment plan and answered all other questions to patient's satisfaction  Ihor Austin, [facility]  [facility] [address] [place], [place] [address]  Phone [phone] Fax [phone] Note: This document was prepared with digital dictation and possible smart phrase technology. Any transcriptional errors that result from this process are unintentional.

## 2022-12-23 ENCOUNTER — Other Ambulatory Visit (HOSPITAL_BASED_OUTPATIENT_CLINIC_OR_DEPARTMENT_OTHER): Payer: Self-pay

## 2023-02-06 ENCOUNTER — Other Ambulatory Visit: Payer: Self-pay | Admitting: Family Medicine

## 2023-02-06 ENCOUNTER — Other Ambulatory Visit (HOSPITAL_BASED_OUTPATIENT_CLINIC_OR_DEPARTMENT_OTHER): Payer: Self-pay

## 2023-02-06 DIAGNOSIS — I10 Essential (primary) hypertension: Secondary | ICD-10-CM

## 2023-02-07 ENCOUNTER — Other Ambulatory Visit (HOSPITAL_BASED_OUTPATIENT_CLINIC_OR_DEPARTMENT_OTHER): Payer: Self-pay

## 2023-02-07 MED ORDER — LISINOPRIL 20 MG PO TABS
20.0000 mg | ORAL_TABLET | Freq: Every morning | ORAL | 0 refills | Status: DC
  Filled 2023-02-07 – 2023-02-10 (×2): qty 90, 90d supply, fill #0

## 2023-02-10 ENCOUNTER — Other Ambulatory Visit (HOSPITAL_BASED_OUTPATIENT_CLINIC_OR_DEPARTMENT_OTHER): Payer: Self-pay

## 2023-02-10 ENCOUNTER — Other Ambulatory Visit: Payer: Self-pay | Admitting: Family Medicine

## 2023-02-10 DIAGNOSIS — I10 Essential (primary) hypertension: Secondary | ICD-10-CM

## 2023-02-10 MED ORDER — METOPROLOL TARTRATE 25 MG PO TABS
12.5000 mg | ORAL_TABLET | Freq: Two times a day (BID) | ORAL | 0 refills | Status: DC
Start: 2023-02-10 — End: 2023-05-17
  Filled 2023-02-10: qty 90, 90d supply, fill #0

## 2023-02-11 ENCOUNTER — Other Ambulatory Visit (HOSPITAL_BASED_OUTPATIENT_CLINIC_OR_DEPARTMENT_OTHER): Payer: Self-pay

## 2023-02-14 ENCOUNTER — Telehealth: Payer: Self-pay

## 2023-02-14 ENCOUNTER — Other Ambulatory Visit: Payer: Self-pay | Admitting: Family Medicine

## 2023-02-14 ENCOUNTER — Other Ambulatory Visit (HOSPITAL_BASED_OUTPATIENT_CLINIC_OR_DEPARTMENT_OTHER): Payer: Self-pay

## 2023-02-14 ENCOUNTER — Other Ambulatory Visit: Payer: Self-pay

## 2023-02-14 MED ORDER — ONETOUCH VERIO VI STRP
ORAL_STRIP | 12 refills | Status: DC
  Filled 2023-02-14: qty 100, 100d supply, fill #0
  Filled 2023-05-21: qty 100, 100d supply, fill #1
  Filled 2023-10-30: qty 100, 100d supply, fill #2

## 2023-02-14 MED ORDER — METFORMIN HCL ER 750 MG PO TB24
750.0000 mg | ORAL_TABLET | Freq: Two times a day (BID) | ORAL | 0 refills | Status: DC
  Filled 2023-02-14: qty 180, 90d supply, fill #0

## 2023-02-14 MED ORDER — ONETOUCH DELICA PLUS LANCET33G MISC
12 refills | Status: DC
  Filled 2023-02-14: qty 100, 100d supply, fill #0
  Filled 2023-05-21: qty 100, 100d supply, fill #1
  Filled 2023-10-30 – 2024-02-06 (×5): qty 100, 100d supply, fill #2

## 2023-02-14 NOTE — Telephone Encounter (Signed)
 Copied from CRM [id number]. Topic: Clinical - Prescription Issue >> Feb 14, 2023  1:56 PM Susanna ORN wrote: Reason for CRM: Patient states she's trying to get her [facility] VERIO test strips & the Blood Glucose Monitoring Suppl (ONE TOUCH ULTRA 2) w/Device KIT sent downstairs to [facility]. States she was told she will need an order for it because the previous pharmacy that she used, [facility], has closed. She stated the strips that she has expired and her blood glucose was readings were off.

## 2023-02-14 NOTE — Telephone Encounter (Signed)
 Rxs sent

## 2023-02-15 ENCOUNTER — Other Ambulatory Visit: Payer: Self-pay

## 2023-02-15 ENCOUNTER — Other Ambulatory Visit: Payer: Self-pay | Admitting: Family Medicine

## 2023-02-15 ENCOUNTER — Other Ambulatory Visit (HOSPITAL_BASED_OUTPATIENT_CLINIC_OR_DEPARTMENT_OTHER): Payer: Self-pay

## 2023-02-15 DIAGNOSIS — I10 Essential (primary) hypertension: Secondary | ICD-10-CM

## 2023-02-15 MED ORDER — FUROSEMIDE 20 MG PO TABS
20.0000 mg | ORAL_TABLET | Freq: Every morning | ORAL | 0 refills | Status: DC
  Filled 2023-02-15 – 2023-02-17 (×2): qty 90, 90d supply, fill #0

## 2023-02-17 ENCOUNTER — Ambulatory Visit (INDEPENDENT_AMBULATORY_CARE_PROVIDER_SITE_OTHER): Payer: Medicare HMO | Admitting: Physician Assistant

## 2023-02-17 ENCOUNTER — Encounter: Payer: Self-pay | Admitting: Physician Assistant

## 2023-02-17 ENCOUNTER — Other Ambulatory Visit (HOSPITAL_BASED_OUTPATIENT_CLINIC_OR_DEPARTMENT_OTHER): Payer: Self-pay

## 2023-02-17 VITALS — BP 116/75 | HR 65 | Temp 98.3°F | Ht 61.0 in | Wt 200.0 lb

## 2023-02-17 DIAGNOSIS — E049 Nontoxic goiter, unspecified: Secondary | ICD-10-CM | POA: Diagnosis not present

## 2023-02-17 DIAGNOSIS — R051 Acute cough: Secondary | ICD-10-CM

## 2023-02-17 DIAGNOSIS — R1310 Dysphagia, unspecified: Secondary | ICD-10-CM

## 2023-02-17 DIAGNOSIS — R490 Dysphonia: Secondary | ICD-10-CM | POA: Diagnosis not present

## 2023-02-17 NOTE — Progress Notes (Signed)
 Established patient visit   Patient: Sherry [name]   DOB: July 28, 1950   73 y.o. Female  MRN: [id number] Visit Date: 02/17/2023  Today's healthcare provider: Manuelita Flatness, PA-C   Chief Complaint  Patient presents with   Sore Throat    Not really sore but very scratchy and dry. 3 days  OTC- mucinex and theraflu    Nasal Congestion    Patient also has a dry cough- 3 days   Subjective     Pt reports for the last three days, dry, scratchy, itchy throat, with some trouble swallowing. She reports not being able to swallow her bigger pills as well. Denies issues with small pills, liquids or solids.   She reports she was exposed to RSV recently. Reports a dry cough, runny nose. Denies sore or painful throat.   She has a chronic thyroid  goiter and reports it feels larger and like it has been putting pressure on her throat.   Medications: Outpatient Medications Prior to Visit  Medication Sig   albuterol  (VENTOLIN  HFA) 108 (90 Base) MCG/ACT inhaler Inhale 2 puffs into the lungs every 6 (six) hours as needed for wheezing or shortness of breath.   beclomethasone (QVAR  REDIHALER) 80 MCG/ACT inhaler Inhale 1 puff into the lungs 2 (two) times daily.   beclomethasone (QVAR ) 80 MCG/ACT inhaler Inhale 1 puff into the lungs 2 (two) times daily.   Blood Glucose Monitoring Suppl (ONE TOUCH ULTRA 2) w/Device KIT Use to check glucose up to twice daily. E11.9 dx code   cetirizine  (ZYRTEC ) 10 MG tablet Take 1 tablet (10 mg total) by mouth daily.   cetirizine  (ZYRTEC ) 10 MG tablet Take 1 tablet (10 mg total) by mouth daily.   Cholecalciferol (VITAMIN D ) 50 MCG (2000 UT) tablet Take 2,000 Units by mouth daily.   clopidogrel  (PLAVIX ) 75 MG tablet Take 1 tablet (75 mg total) by mouth daily.   empagliflozin  (JARDIANCE ) 10 MG TABS tablet Take 1 tablet (10 mg total) by mouth daily. (Patient taking differently: Take 5 mg by mouth daily.)   famotidine  (PEPCID ) 40 MG tablet TAKE ONE TABLET BY MOUTH  EVERY MORNING *Overdue FOR appointment   famotidine  (PEPCID ) 40 MG tablet Take 1 tablet (40 mg total) by mouth daily.   FLOVENT  HFA 220 MCG/ACT inhaler INHALE 1 PUFF BY MOUTH INTO LUNGS twice A DAY. MAY INCREASE TO TWO PUFFS AS NEEDED   fluticasone  (FLONASE ) 50 MCG/ACT nasal spray Place 2 sprays into both nostrils daily.   fluticasone  (FLONASE ) 50 MCG/ACT nasal spray Place 2 sprays into both nostrils daily.   furosemide  (LASIX ) 20 MG tablet Take 1 tablet (20 mg total) by mouth every morning.   gabapentin  (NEURONTIN ) 300 MG capsule Take 2 capsules (600 mg total) by mouth 2 (two) times daily.   glucose blood (ONETOUCH VERIO) test strip Check blood sugars once daily   ipratropium (ATROVENT ) 0.03 % nasal spray Place 1 spray into both nostrils in the morning.   linagliptin  (TRADJENTA ) 5 MG TABS tablet Take 1 tablet (5 mg total) by mouth daily. (Patient taking differently: Take 2.5 mg by mouth daily.)   lisinopril  (ZESTRIL ) 20 MG tablet Take 1 tablet (20 mg total) by mouth every morning.   metFORMIN  (GLUCOPHAGE -XR) 750 MG 24 hr tablet Take 1 tablet (750 mg total) by mouth 2 (two) times daily.   metoprolol  tartrate (LOPRESSOR ) 25 MG tablet Take 0.5 tablets (12.5 mg total) by mouth 2 (two) times daily.   Multiple Vitamins-Minerals (ADULT GUMMY PO) Take  2 capsules by mouth daily.   nortriptyline  (PAMELOR ) 10 MG capsule Take 3 capsules (30 mg total) by mouth at bedtime.   nortriptyline  (PAMELOR ) 10 MG capsule Take 3 capsules (30 mg total) by mouth at bedtime.   Lancets (ONETOUCH DELICA PLUS LANCET33G) MISC Use to check blood sugars once daily.   Spacer/Aero-Holding Raguel FRENCH Use with Flovent  and albuterol  inhalers   Ubrogepant  (UBRELVY ) 50 MG TABS Take 1 tablet (50 mg total) by mouth at onset of migraine. May repeat in 2 hours, if needed. Max dose: 2 tablets/day. This is a 30 day prescription.   No facility-administered medications prior to visit.    Review of Systems  Constitutional:  Negative for  fatigue and fever.  HENT:  Positive for rhinorrhea and trouble swallowing.   Respiratory:  Positive for cough. Negative for shortness of breath.   Cardiovascular:  Negative for chest pain and leg swelling.  Gastrointestinal:  Negative for abdominal pain.  Neurological:  Negative for dizziness and headaches.       Objective    BP 116/75   Pulse 65   Temp 98.3 F (36.8 C) (Oral)   Ht 5' 1 (1.549 m)   Wt 200 lb (90.7 kg)   SpO2 97%   BMI 37.79 kg/m    Physical Exam Constitutional:      General: She is awake.     Appearance: She is well-developed.  HENT:     Head: Normocephalic.     Comments: No cervical lymphadenopathy, visible and palpable thyroid  goiter, soft.    Mouth/Throat:     Pharynx: Posterior oropharyngeal erythema present.     Comments: Mild tonsillar edema, no visible exudate, white coatings.  Eyes:     Conjunctiva/sclera: Conjunctivae normal.  Cardiovascular:     Rate and Rhythm: Normal rate and regular rhythm.     Heart sounds: Normal heart sounds.  Pulmonary:     Effort: Pulmonary effort is normal.     Breath sounds: Normal breath sounds.  Skin:    General: Skin is warm.  Neurological:     Mental Status: She is alert and oriented to person, place, and time.  Psychiatric:        Attention and Perception: Attention normal.        Mood and Affect: Mood normal.        Speech: Speech normal.        Behavior: Behavior is cooperative.      No results found for any visits on 02/17/23.  Assessment & Plan    Acute cough Given rsv exposure, will swab for rsv.  Advised typically we do not treat differently than another virus Recommend rest, hydration.  Lungs cta, cough is mild -     COVID-19, Flu A+B and RSV  Hoarse Dysphagia, unspecified type -likely 2/2 viral illness, but dysphagia is concerning. Currently just with larger pills.  Recommending biotene dry mouth wash, cough drops/gum to help increase saliva production, hydration.  If dysphagia  increases/changes, would recommend swallow study  Thyroid  goiter From brief chart review, appears chronic and unchanged for a while, but given dysphagia and patient's appreciation of change in size, recommending repeat thyroid  US   -     US  THYROID ; Future   Return if symptoms worsen or fail to improve.       Manuelita Flatness, PA-C  [facility] [phone] (phone) [phone] (fax)  [facility]

## 2023-02-18 ENCOUNTER — Other Ambulatory Visit (HOSPITAL_BASED_OUTPATIENT_CLINIC_OR_DEPARTMENT_OTHER): Payer: Self-pay

## 2023-02-18 ENCOUNTER — Ambulatory Visit (HOSPITAL_BASED_OUTPATIENT_CLINIC_OR_DEPARTMENT_OTHER)
Admission: RE | Admit: 2023-02-18 | Discharge: 2023-02-18 | Disposition: A | Discharge status: Home / Self Care | Payer: Medicare HMO | Source: Ambulatory Visit | Attending: Physician Assistant | Admitting: Physician Assistant

## 2023-02-18 DIAGNOSIS — E049 Nontoxic goiter, unspecified: Secondary | ICD-10-CM | POA: Insufficient documentation

## 2023-02-19 LAB — COVID-19, FLU A+B AND RSV
Influenza A, NAA: NOT DETECTED
Influenza B, NAA: NOT DETECTED
RSV, NAA: NOT DETECTED
SARS-CoV-2, NAA: NOT DETECTED

## 2023-02-21 ENCOUNTER — Encounter: Payer: Self-pay | Admitting: Physician Assistant

## 2023-02-21 ENCOUNTER — Other Ambulatory Visit (HOSPITAL_BASED_OUTPATIENT_CLINIC_OR_DEPARTMENT_OTHER): Payer: Self-pay

## 2023-02-22 ENCOUNTER — Other Ambulatory Visit (HOSPITAL_BASED_OUTPATIENT_CLINIC_OR_DEPARTMENT_OTHER): Payer: Self-pay

## 2023-02-23 ENCOUNTER — Other Ambulatory Visit (HOSPITAL_BASED_OUTPATIENT_CLINIC_OR_DEPARTMENT_OTHER): Payer: Self-pay

## 2023-02-24 ENCOUNTER — Other Ambulatory Visit (HOSPITAL_BASED_OUTPATIENT_CLINIC_OR_DEPARTMENT_OTHER): Payer: Self-pay

## 2023-02-25 ENCOUNTER — Other Ambulatory Visit (HOSPITAL_BASED_OUTPATIENT_CLINIC_OR_DEPARTMENT_OTHER): Payer: Self-pay

## 2023-02-28 ENCOUNTER — Other Ambulatory Visit (HOSPITAL_BASED_OUTPATIENT_CLINIC_OR_DEPARTMENT_OTHER): Payer: Self-pay

## 2023-03-01 ENCOUNTER — Other Ambulatory Visit (HOSPITAL_BASED_OUTPATIENT_CLINIC_OR_DEPARTMENT_OTHER): Payer: Self-pay

## 2023-03-02 ENCOUNTER — Other Ambulatory Visit (HOSPITAL_BASED_OUTPATIENT_CLINIC_OR_DEPARTMENT_OTHER): Payer: Self-pay

## 2023-03-03 ENCOUNTER — Other Ambulatory Visit (HOSPITAL_BASED_OUTPATIENT_CLINIC_OR_DEPARTMENT_OTHER): Payer: Self-pay

## 2023-03-08 ENCOUNTER — Other Ambulatory Visit (HOSPITAL_BASED_OUTPATIENT_CLINIC_OR_DEPARTMENT_OTHER): Payer: Self-pay

## 2023-03-10 ENCOUNTER — Telehealth: Payer: Self-pay | Admitting: Neurology

## 2023-03-10 ENCOUNTER — Other Ambulatory Visit (HOSPITAL_COMMUNITY): Payer: Self-pay

## 2023-03-10 ENCOUNTER — Other Ambulatory Visit (HOSPITAL_BASED_OUTPATIENT_CLINIC_OR_DEPARTMENT_OTHER): Payer: Self-pay

## 2023-03-10 ENCOUNTER — Telehealth: Payer: Self-pay | Admitting: Pharmacy Technician

## 2023-03-10 NOTE — Telephone Encounter (Signed)
Pharmacy Patient Advocate Encounter  Received notification from [facility] that Prior Authorization for Ubrelvy 50MG  tablets  has been APPROVED from 03/10/2003 to 02/08/2024   PA #/Case ID/Reference #: [id number]

## 2023-03-10 NOTE — Telephone Encounter (Signed)
Addie Nurse Case Manager @ [facility] is requesting a PA for pt's Ubrogepant (UBRELVY) 50 MG TABS

## 2023-03-10 NOTE — Telephone Encounter (Signed)
PA request has been Submitted. New Encounter created for follow up. For additional info see Pharmacy Prior Auth telephone encounter from 03/10/2023.

## 2023-03-10 NOTE — Telephone Encounter (Signed)
Pharmacy Patient Advocate Encounter   Received notification from Pt Calls Messages that prior authorization for Ubrelvy 50MG  tablets is required/requested.   Insurance verification completed.   The patient is insured through [facility] .   Per test claim: PA required; PA submitted to above mentioned insurance via CoverMyMeds Key/confirmation #/EOC [id number] Status is pending

## 2023-03-11 ENCOUNTER — Other Ambulatory Visit: Payer: Self-pay

## 2023-03-11 ENCOUNTER — Other Ambulatory Visit (HOSPITAL_BASED_OUTPATIENT_CLINIC_OR_DEPARTMENT_OTHER): Payer: Self-pay

## 2023-03-18 ENCOUNTER — Other Ambulatory Visit (HOSPITAL_BASED_OUTPATIENT_CLINIC_OR_DEPARTMENT_OTHER): Payer: Self-pay

## 2023-03-22 ENCOUNTER — Other Ambulatory Visit (HOSPITAL_BASED_OUTPATIENT_CLINIC_OR_DEPARTMENT_OTHER): Payer: Self-pay

## 2023-03-25 ENCOUNTER — Other Ambulatory Visit (HOSPITAL_BASED_OUTPATIENT_CLINIC_OR_DEPARTMENT_OTHER): Payer: Self-pay

## 2023-03-28 NOTE — Progress Notes (Unsigned)
ASSESSMENT AND PLAN  Sherry [name] is a 73 y.o. female   Chronic migraine Cerebral vascular disease  Nortriptyline 10 mg titrating to 30 mg every night as preventive medications  Ubrelvy as needed  ESR C-reactive protein was normal,  MRI of the brain showed no significant abnormalities,  CT angiogram showed intracranial atherosclerotic disease, vascular risk factor of aging, hypertension, diabetes, continue Plavix daily  Prolonged severe migraine headache,  Received trigger point injection today, reported immediate improvement     DIAGNOSTIC DATA (LABS, IMAGING, TESTING) - I reviewed patient records, labs, notes, testing and imaging myself where available.   MEDICAL HISTORY:  Update 03/29/2023 JM: Patient returns for follow-up migraine visit.  She was previously seen by Dr. Terrace Arabia in 10/2022 and received trigger point injections.    Continues on nortriptyline 30 mg nightly Use of Sherry [name] ***         UPDATE Sept 19 2024 Dr. Terrace Arabia: She presented to emergency room October 27, 2022, complains of throbbing right-sided headache started around 3 AM, gradual onset progressively getting worse, tried [place] without relief, she does complains of light noise sensitivity:, Nauseous,  Had extensive imaging study, personally reviewed the film, MRI of the brain without contrast no acute intracranial abnormality CT angiogram of head and neck, no evidence of intracranial aneurysm, multivessel intra cranial atherosclerotic disease  Laboratory in July 2024 showed normal C-reactive protein, ESR, CBC,  Today she called office to receive acute treatment for her persistent headache, described 7 out of 10, light sensitivity, hard for her to function   Consult visit 08/18/2022 Dr. Terrace Arabia: Sherry [name] is a 73 year old female, seen in request by her primary care physician Dr.   Abbe Amsterdam for evaluation of headache, initial evaluation was on August 18, 2022  I reviewed and  summarized the referring note. PMHX Dm HTN Stroke in 2006, with left hemiparesis, Left bunionectomy Right reverse shoulder replacement Brain aneurysm Hx of bilateral knee replacement   She underwent a lot of stress, lost a lot of family members, began to have frequent headache over the past few months, couple times each months, sometimes proceeding with seeing sparkling her eyes, left visual field blurry, then holoacranial, occipital area pressure headache lasting for few hours, often improved by sleep,  Reported long history of headache, when she was younger in elementary school, could remember her mother tied a scarf around her head when she complains of a headache  She complains of frequent headaches throughout her adult life,  Presented to emergency room on Jul 08, 2022 for headache, chest pain, troponin was negative,  CT angiogram head and neck no significant large vessel disease, no evidence of aneurysm, previously described in 2022 four-vessel angiogram, 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 136/78  PHYSICAL EXAMNIATION:  Gen: NAD, conversant, well nourised, well groomed                     Cardiovascular: Regular rate rhythm, no peripheral edema, warm, nontender. Eyes: Conjunctivae clear without exudates or hemorrhage Neck: Supple, no carotid bruits. Pulmonary: Clear to auscultation bilaterally   NEUROLOGICAL EXAM:  MENTAL STATUS: Speech/cognition: Awake, alert, oriented to history taking and casual conversation, in mild distress CRANIAL NERVES: CN II: Visual fields are full to confrontation. Pupils are round equal and briskly reactive to light. CN III,  IV, VI: extraocular movement are normal. No ptosis. CN V: Facial sensation is intact to light touch CN VII: Face is symmetric with normal eye  closure  CN VIII: Hearing is normal to causal conversation. CN IX, X: Phonation is normal. CN XI: Head turning and shoulder shrug are intact  MOTOR: There is no pronator drift of out-stretched arms. Muscle bulk and tone are normal. Muscle strength is normal.  REFLEXES: Reflexes are 2+ and symmetric at the biceps, triceps, knees, and ankles. Plantar responses are flexor.  SENSORY: Intact to light touch, pinprick and vibratory sensation are intact in fingers and toes.  COORDINATION: There is no trunk or limb dysmetria noted.  GAIT/STANCE: Posture is normal. Gait is steady with normal steps, base, arm swing, and turning. Heel and toe walking are normal. Tandem gait is normal.  Romberg is absent.  REVIEW OF SYSTEMS:  Full 14 system review of systems performed and notable only for as above All other review of systems were negative.   ALLERGIES: Allergies  Allergen Reactions   Influenza Vaccines Anaphylaxis   Atorvastatin Other (See Comments)    Joint pain / myalgias   Demerol Other (See Comments)    SEIZURE   Nsaids Other (See Comments)    NAPROXEN, IBUPROFEN, SKELAXIN---  CAUSES PALPITATIONS   Pravastatin Other (See Comments)    Myalgias    Prednisone Other (See Comments)    Caused her glucose to go up to 500 and went to ED   Rosuvastatin Other (See Comments)    Joint stiffness and muscle pain   Roxicodone [Oxycodone] Itching and Other (See Comments)    Hallucinations and itching   Tramadol Other (See Comments)    Contraindicated with Victoza    Covid-19 (Mrna) Vaccine     Patient had anaphylactic reaction to flu vaccine   Codeine Itching   Mango Flavor [Flavoring Agent (Non-Screening)] Itching    HOME MEDICATIONS: Current Outpatient Medications  Medication Sig Dispense Refill   albuterol (VENTOLIN HFA) 108 (90 Base) MCG/ACT inhaler Inhale 2 puffs into the lungs every 6 (six) hours as needed for wheezing or shortness of breath. 18 g 2   beclomethasone (QVAR  REDIHALER) 80 MCG/ACT inhaler Inhale 1 puff into the lungs 2 (two) times daily. 1 each 6   beclomethasone (QVAR) 80 MCG/ACT inhaler Inhale 1 puff into the lungs 2 (two) times daily. 31.8 g 3   Blood Glucose Monitoring Suppl (ONE TOUCH ULTRA 2) w/Device KIT Use to check glucose up to twice daily. E11.9 dx code 1 kit 0   cetirizine (ZYRTEC) 10 MG tablet Take 1 tablet (10 mg total) by mouth daily. 90 tablet 3   cetirizine (ZYRTEC) 10 MG tablet Take 1 tablet (10 mg total) by mouth daily. 90 tablet 3   Cholecalciferol (VITAMIN D) 50 MCG (2000 UT) tablet Take 2,000 Units by mouth daily.     clopidogrel (PLAVIX) 75 MG tablet Take 1 tablet (75 mg total) by mouth daily. 90 tablet 3   empagliflozin (JARDIANCE) 10 MG TABS tablet Take 1 tablet (10 mg total) by mouth daily. (Patient taking differently: Take 5 mg by mouth daily.) 90 tablet 3   famotidine (PEPCID) 40 MG tablet TAKE ONE TABLET BY MOUTH EVERY MORNING *Overdue FOR appointment 30 tablet 0   famotidine (PEPCID) 40 MG tablet Take 1 tablet (40 mg total) by mouth daily. 90 tablet 3   FLOVENT HFA 220 MCG/ACT inhaler INHALE 1 PUFF BY MOUTH INTO LUNGS twice A DAY. MAY INCREASE TO TWO PUFFS AS  NEEDED 12 g 5   fluticasone (FLONASE) 50 MCG/ACT nasal spray Place 2 sprays into both nostrils daily. 16 g 6   fluticasone (FLONASE) 50 MCG/ACT nasal spray Place 2 sprays into both nostrils daily. 48 g 3   furosemide (LASIX) 20 MG tablet Take 1 tablet (20 mg total) by mouth every morning. 90 tablet 0   gabapentin (NEURONTIN) 300 MG capsule Take 2 capsules (600 mg total) by mouth 2 (two) times daily. 360 capsule 3   glucose blood (ONETOUCH VERIO) test strip Check blood sugars once daily 100 each 12   ipratropium (ATROVENT) 0.03 % nasal spray Place 1 spray into both nostrils in the morning. 90 mL 1   linagliptin (TRADJENTA) 5 MG TABS tablet Take 1 tablet (5 mg total) by mouth daily. (Patient taking differently: Take 2.5 mg by mouth daily.) 90 tablet 3   lisinopril  (ZESTRIL) 20 MG tablet Take 1 tablet (20 mg total) by mouth every morning. 90 tablet 0   metFORMIN (GLUCOPHAGE-XR) 750 MG 24 hr tablet Take 1 tablet (750 mg total) by mouth 2 (two) times daily. 180 tablet 0   metoprolol tartrate (LOPRESSOR) 25 MG tablet Take 0.5 tablets (12.5 mg total) by mouth 2 (two) times daily. 90 tablet 0   Multiple Vitamins-Minerals (ADULT GUMMY PO) Take 2 capsules by mouth daily.     nortriptyline (PAMELOR) 10 MG capsule Take 3 capsules (30 mg total) by mouth at bedtime. 90 capsule 11   nortriptyline (PAMELOR) 10 MG capsule Take 3 capsules (30 mg total) by mouth at bedtime. 90 capsule 11   Lancets (ONETOUCH DELICA PLUS LANCET33G) MISC Use to check blood sugars once daily. 100 each 12   Spacer/Aero-Holding Chambers DEVI Use with Flovent and albuterol inhalers 1 each 0   Ubrogepant (UBRELVY) 50 MG TABS Take 1 tablet (50 mg total) by mouth at onset of migraine. May repeat in 2 hours, if needed. Max dose: 2 tablets/day. This is a 30 day prescription. 12 tablet 11   No current facility-administered medications for this visit.    PAST MEDICAL HISTORY: Past Medical History:  Diagnosis Date   Allergy 1970   Demerol, FLu vaccine, NSAIDS, Codiene   Anxiety 2002   Ongoing   Asthma    Cataract    Small ones found by opthamolgist   Cerebral aneurysm    being watched by Dr. Link Snuffer every 6 months   CHF (congestive heart failure) (HCC)    Resolved   Complication of anesthesia HYPOTENSION INTRAOP W/ HYSTERECTOMY IN 1987--  DID OK W/ TOTAL KNEE IN 2008   COPD (chronic obstructive pulmonary disease) (HCC) 1970   Bronchitits   Depression 1970   Diabetes mellitus    DJD (degenerative joint disease)    Dyslipidemia    GERD (gastroesophageal reflux disease)    H/O hiatal hernia    History of CHF (congestive heart failure) 2010-  RESOLVED   History of peptic ulcer YRS AGO   Hypertension    Meniere's disease    Mitral valve prolapse occasional palpitations w/ chest  discomfort   Neuromuscular disorder (HCC) 2002   Peripheral neuropathy   OA (osteoarthritis)    Rotator cuff tear, left    Stroke [facility])    Thyroid disease 2010   Multinodal goiter    PAST SURGICAL HISTORY: Past Surgical History:  Procedure Laterality Date   BUNIONECTOMY  02/08/2005   LEFT FOOT   FRACTURE SURGERY  07/30/2019   (R) Reverse Shoulder Replacement   IR 3D INDEPENDENT WKST  01/06/2021   IR ANGIO INTRA EXTRACRAN SEL COM CAROTID INNOMINATE BILAT MOD SED  03/22/2019   IR ANGIO INTRA EXTRACRAN SEL COM CAROTID INNOMINATE BILAT MOD SED  01/06/2021   IR ANGIO VERTEBRAL SEL SUBCLAVIAN INNOMINATE UNI L MOD SED  03/22/2019   IR ANGIO VERTEBRAL SEL VERTEBRAL UNI R MOD SED  03/22/2019   IR ANGIO VERTEBRAL SEL VERTEBRAL UNI R MOD SED  01/06/2021   IR US GUIDE VASC ACCESS RIGHT  03/22/2019   IR US GUIDE VASC ACCESS RIGHT  01/06/2021   JOINT REPLACEMENT  2008, 2011,2021   Total knee replacement, '08 (R), '11 (L)   LEFT SHOULDER SURGERY  02/09/1995   ROTATOR CUFF REPAIR   REVERSE SHOULDER ARTHROPLASTY Right 07/30/2020   Procedure: REVERSE SHOULDER ARTHROPLASTY;  Surgeon: Bjorn Pippin, MD;  Location: [facility];  Service: Orthopedics;  Laterality: Right;   SHOULDER ARTHROSCOPY  09/20/2011   Procedure: ARTHROSCOPY SHOULDER;  Surgeon: Javier Docker, MD;  Location: [facility];  Service: Orthopedics;  Laterality: Left;  SAD AND MINI OPEN ROTATOR CUFF REPAIR     TOTAL KNEE ARTHROPLASTY  09-12-2006  [facility]   RIGHT KNEE   TOTAL KNEE ARTHROPLASTY Left 11/30/2017   Procedure: LEFT TOTAL KNEE REPLACEMENT;  Surgeon: Eldred Manges, MD;  Location: [facility];  Service: Orthopedics;  Laterality: Left;   TUBAL LIGATION  02/08/1974   TYMPANOPLASTY  1990;   1980   VAGINAL HYSTERECTOMY  02/08/1985    FAMILY HISTORY: Family History  Problem Relation Age of Onset   Heart disease Mother        in her 30s, heart attack   Diabetes Mother    Kidney disease Mother        dialysis    Thyroid disease Mother    Aneurysm Mother    Ovarian cancer Mother    Breast cancer Mother    Arthritis Mother    Cancer Mother    Depression Mother    Stroke Mother    Thyroid disease Sister    Heart disease Sister    Diabetes Sister    Hyperlipidemia Sister    Hypertension Sister    Lymphoma Father    Diabetes Father    Hypertension Father    Congestive Heart Failure Father    Arthritis Father    Cancer Father    Vision loss Father    Thyroid disease Brother    Hyperlipidemia Brother    Hypertension Brother    Obesity Brother    Thyroid disease Paternal Grandmother    Arthritis Paternal Grandmother    Heart attack Sister        at age 38   Stroke Sister    Hypertension Sister    Arthritis Sister    Asthma Sister    Cancer Sister    Diabetes Sister    Hearing loss Sister    Hyperlipidemia Sister    Obesity Sister    Vision loss Sister    Breast cancer Maternal Aunt    Breast cancer Maternal Grandmother    Anxiety disorder Maternal Grandmother    Breast cancer Maternal Aunt    Breast cancer Maternal Aunt    COPD Paternal Grandfather    Early death Paternal Grandfather    Other Brother        Set designer accident   Drug abuse Brother    Bipolar disorder Son    Other Son        GSW   Early death Son  ADD / ADHD Daughter    Diabetes Daughter    Anxiety disorder Daughter    Diabetes Daughter    Obesity Daughter    Early death Brother     SOCIAL HISTORY: Social History   Socioeconomic History   Marital status: Widowed    Spouse name: Not on file   Number of children: 4   Years of education: 16   Highest education level: Bachelor's degree (e.g., BA, AB, BS)  Occupational History   Occupation: LPN - retired  Tobacco Use   Smoking status: Never    Passive exposure: Never   Smokeless tobacco: Never  Vaping Use   Vaping status: Never Used  Substance and Sexual Activity   Alcohol use: No   Drug use: No   Sexual activity: Not Currently    Birth  control/protection: Post-menopausal  Other Topics Concern   Not on file  Social History Narrative   Right handed   Caffeine- 1 cup daily   Lives at home alone   Social Drivers of Health   Financial Resource Strain: Low Risk  (11/02/2022)   Overall Financial Resource Strain (CARDIA)    Difficulty of Paying Living Expenses: Not hard at all  Food Insecurity: No Food Insecurity (11/02/2022)   Hunger Vital Sign    Worried About Running Out of Food in the Last Year: Never true    Ran Out of Food in the Last Year: Never true  Transportation Needs: No Transportation Needs (11/02/2022)   PRAPARE - Administrator, Civil Service (Medical): No    Lack of Transportation (Non-Medical): No  Physical Activity: Insufficiently Active (11/02/2022)   Exercise Vital Sign    Days of Exercise per Week: 1 day    Minutes of Exercise per Session: 80 min  Stress: Stress Concern Present (11/02/2022)   [employer] of Occupational Health - Occupational Stress Questionnaire    Feeling of Stress : Rather much  Social Connections: Moderately Integrated (11/02/2022)   Social Connection and Isolation Panel [NHANES]    Frequency of Communication with Friends and Family: More than three times a week    Frequency of Social Gatherings with Friends and Family: More than three times a week    Attends Religious Services: More than 4 times per year    Active Member of [employer] or Organizations: Yes    Attends Banker Meetings: 1 to 4 times per year    Marital Status: Widowed  Intimate Partner Violence: Not At Risk (08/18/2022)   Humiliation, Afraid, Rape, and Kick questionnaire    Fear of Current or Ex-Partner: No    Emotionally Abused: No    Physically Abused: No    Sexually Abused: No      I spent *** minutes of face-to-face and non-face-to-face time with patient.  This included previsit chart review, lab review, study review, order entry, electronic health record documentation, patient  education and discussion regarding above diagnoses and treatment plan and answered all other questions to patient's satisfaction  Ihor Austin, [facility]  [facility] [address] [place], [place] [address]  Phone [phone] Fax [phone] Note: This document was prepared with digital dictation and possible smart phrase technology. Any transcriptional errors that result from this process are unintentional.

## 2023-03-29 ENCOUNTER — Encounter: Payer: Self-pay | Admitting: Adult Health

## 2023-03-29 ENCOUNTER — Ambulatory Visit: Payer: Medicare HMO | Admitting: Adult Health

## 2023-03-29 VITALS — BP 136/82 | HR 89

## 2023-03-29 DIAGNOSIS — G43709 Chronic migraine without aura, not intractable, without status migrainosus: Secondary | ICD-10-CM | POA: Diagnosis not present

## 2023-03-29 NOTE — Patient Instructions (Addendum)
Your Plan:  Continue Bernita Raisin as needed  Please call with any worsening headaches or interested in restarting a daily preventative medication      Follow-up in 6 months or call earlier if needed      Thank you for coming to see [place] at [facility]. I hope we have been able to provide you high quality care today.  You may receive a patient satisfaction survey over the next few weeks. We would appreciate your feedback and comments so that we may continue to improve ourselves and the health of our patients.

## 2023-04-10 ENCOUNTER — Other Ambulatory Visit: Payer: Self-pay | Admitting: Family Medicine

## 2023-04-10 DIAGNOSIS — I10 Essential (primary) hypertension: Secondary | ICD-10-CM

## 2023-04-11 ENCOUNTER — Other Ambulatory Visit (HOSPITAL_BASED_OUTPATIENT_CLINIC_OR_DEPARTMENT_OTHER): Payer: Self-pay

## 2023-04-11 MED ORDER — FUROSEMIDE 20 MG PO TABS
20.0000 mg | ORAL_TABLET | Freq: Every morning | ORAL | 0 refills | Status: DC
  Filled 2023-04-11 – 2023-05-17 (×2): qty 90, 90d supply, fill #0

## 2023-04-19 ENCOUNTER — Other Ambulatory Visit (HOSPITAL_BASED_OUTPATIENT_CLINIC_OR_DEPARTMENT_OTHER): Payer: Self-pay

## 2023-05-17 ENCOUNTER — Other Ambulatory Visit: Payer: Self-pay | Admitting: Family Medicine

## 2023-05-17 DIAGNOSIS — I10 Essential (primary) hypertension: Secondary | ICD-10-CM

## 2023-05-18 ENCOUNTER — Other Ambulatory Visit: Payer: Self-pay

## 2023-05-18 ENCOUNTER — Other Ambulatory Visit (HOSPITAL_BASED_OUTPATIENT_CLINIC_OR_DEPARTMENT_OTHER): Payer: Self-pay

## 2023-05-18 MED ORDER — METOPROLOL TARTRATE 25 MG PO TABS
12.5000 mg | ORAL_TABLET | Freq: Two times a day (BID) | ORAL | 0 refills | Status: DC
  Filled 2023-05-18: qty 90, 90d supply, fill #0

## 2023-05-18 MED ORDER — LISINOPRIL 20 MG PO TABS
20.0000 mg | ORAL_TABLET | Freq: Every morning | ORAL | 0 refills | Status: DC
Start: 2023-05-18 — End: 2023-08-15
  Filled 2023-05-18: qty 90, 90d supply, fill #0

## 2023-05-18 MED ORDER — METFORMIN HCL ER 750 MG PO TB24
750.0000 mg | ORAL_TABLET | Freq: Two times a day (BID) | ORAL | 0 refills | Status: DC
  Filled 2023-05-18: qty 180, 90d supply, fill #0

## 2023-05-23 ENCOUNTER — Other Ambulatory Visit (HOSPITAL_BASED_OUTPATIENT_CLINIC_OR_DEPARTMENT_OTHER): Payer: Self-pay

## 2023-06-06 ENCOUNTER — Other Ambulatory Visit (HOSPITAL_BASED_OUTPATIENT_CLINIC_OR_DEPARTMENT_OTHER): Payer: Self-pay

## 2023-06-30 ENCOUNTER — Telehealth: Payer: Self-pay | Admitting: Family Medicine

## 2023-06-30 NOTE — Telephone Encounter (Signed)
 Copied from CRM [id number]. Topic: Medicare AWV >> Jun 30, 2023 10:39 AM Juliana Ocean wrote: Reason for CRM: LVM 06/30/2023 to schedule AWV. Please schedule Virtual or Telehealth visits ONLY  Rosalee Collins; Care Guide Ambulatory Clinical Support [place] [facility] Direct Dial: [phone]

## 2023-07-05 ENCOUNTER — Telehealth: Payer: Self-pay

## 2023-07-05 NOTE — Telephone Encounter (Signed)
 Copied from CRM [id number]. Topic: General - Other >> Jul 05, 2023 12:37 PM Adaysia C wrote: Reason for CRM: Telford Feather with [employer] called to verify patient diabetes diagnoses for healthcare plan eligibility; Diabetes diagnoses verified with [employer]; Please follow up with [employer] if necessary [phone]

## 2023-07-12 ENCOUNTER — Other Ambulatory Visit: Payer: Self-pay

## 2023-07-12 ENCOUNTER — Emergency Department (HOSPITAL_BASED_OUTPATIENT_CLINIC_OR_DEPARTMENT_OTHER)

## 2023-07-12 ENCOUNTER — Emergency Department (HOSPITAL_BASED_OUTPATIENT_CLINIC_OR_DEPARTMENT_OTHER)
Admission: EM | Admit: 2023-07-12 | Discharge: 2023-07-13 | Disposition: A | Discharge status: Home / Self Care | Attending: Emergency Medicine | Admitting: Emergency Medicine

## 2023-07-12 ENCOUNTER — Encounter (HOSPITAL_BASED_OUTPATIENT_CLINIC_OR_DEPARTMENT_OTHER): Payer: Self-pay | Admitting: Urology

## 2023-07-12 DIAGNOSIS — R059 Cough, unspecified: Secondary | ICD-10-CM | POA: Insufficient documentation

## 2023-07-12 DIAGNOSIS — G44009 Cluster headache syndrome, unspecified, not intractable: Secondary | ICD-10-CM | POA: Insufficient documentation

## 2023-07-12 DIAGNOSIS — Z794 Long term (current) use of insulin: Secondary | ICD-10-CM | POA: Diagnosis not present

## 2023-07-12 DIAGNOSIS — G44019 Episodic cluster headache, not intractable: Secondary | ICD-10-CM | POA: Diagnosis not present

## 2023-07-12 LAB — BASIC METABOLIC PANEL WITH GFR
Anion gap: 13 (ref 5–15)
BUN: 24 mg/dL — ABNORMAL HIGH (ref 8–23)
CO2: 27 mmol/L (ref 22–32)
Calcium: 10 mg/dL (ref 8.9–10.3)
Chloride: 105 mmol/L (ref 98–111)
Creatinine, Ser: 1.09 mg/dL — ABNORMAL HIGH (ref 0.44–1.00)
GFR, Estimated: 54 mL/min — ABNORMAL LOW (ref >60)
Glucose, Bld: 140 mg/dL — ABNORMAL HIGH (ref 70–99)
Potassium: 4 mmol/L (ref 3.5–5.1)
Sodium: 144 mmol/L (ref 135–145)

## 2023-07-12 LAB — CBC WITH DIFFERENTIAL/PLATELET
Abs Immature Granulocytes: 0.02 10*3/uL (ref 0.00–0.07)
Basophils Absolute: 0.1 10*3/uL (ref 0.0–0.1)
Basophils Relative: 1 %
Eosinophils Absolute: 0.2 10*3/uL (ref 0.0–0.5)
Eosinophils Relative: 2 %
HCT: 45.5 % (ref 36.0–46.0)
Hemoglobin: 14.3 g/dL (ref 12.0–15.0)
Immature Granulocytes: 0 %
Lymphocytes Relative: 42 %
Lymphs Abs: 3.7 10*3/uL (ref 0.7–4.0)
MCH: 28.4 pg (ref 26.0–34.0)
MCHC: 31.4 g/dL (ref 30.0–36.0)
MCV: 90.5 fL (ref 80.0–100.0)
Monocytes Absolute: 0.6 10*3/uL (ref 0.1–1.0)
Monocytes Relative: 7 %
Neutro Abs: 4.3 10*3/uL (ref 1.7–7.7)
Neutrophils Relative %: 48 %
Platelets: 302 10*3/uL (ref 150–400)
RBC: 5.03 MIL/uL (ref 3.87–5.11)
RDW: 14.4 % (ref 11.5–15.5)
WBC: 8.9 10*3/uL (ref 4.0–10.5)
nRBC: 0 % (ref 0.0–0.2)

## 2023-07-12 LAB — RESP PANEL BY RT-PCR (RSV, FLU A&B, COVID)  RVPGX2
Influenza A by PCR: NEGATIVE
Influenza B by PCR: NEGATIVE
Resp Syncytial Virus by PCR: NEGATIVE
SARS Coronavirus 2 by RT PCR: NEGATIVE

## 2023-07-12 MED ORDER — LACTATED RINGERS IV BOLUS
1000.0000 mL | Freq: Once | INTRAVENOUS | Status: AC
  Administered 2023-07-12: 1000 mL via INTRAVENOUS

## 2023-07-12 MED ORDER — PROCHLORPERAZINE EDISYLATE 10 MG/2ML IJ SOLN
10.0000 mg | Freq: Once | INTRAMUSCULAR | Status: AC
  Administered 2023-07-12: 10 mg via INTRAVENOUS
  Filled 2023-07-12: qty 2

## 2023-07-12 NOTE — ED Provider Notes (Signed)
 [facility]  Provider Note  CSN: [id number] Arrival date & time: 07/12/23 1932  History Chief Complaint  Patient presents with   Headache    Sherry [name] is a 73 y.o. female with history of migraines reports about 4-5 days of holocephalic headache, associated with rhinorrhea, nausea and photophobia. She has not had a headache like this before. Has had temporary relief with APAP at home. No fever, occasional mild cough which exacerbates her headache.    Home Medications Prior to Admission medications   Medication Sig Start Date End Date Taking? Authorizing Provider  albuterol  (VENTOLIN  HFA) 108 (90 Base) MCG/ACT inhaler Inhale 2 puffs into the lungs every 6 (six) hours as needed for wheezing or shortness of breath. 04/28/20   Copland, Jessica C, MD  beclomethasone (QVAR  REDIHALER) 80 MCG/ACT inhaler Inhale 1 puff into the lungs 2 (two) times daily. Patient not taking: Reported on 03/29/2023 04/21/22   Copland, Jessica C, MD  beclomethasone (QVAR ) 80 MCG/ACT inhaler Inhale 1 puff into the lungs 2 (two) times daily. 11/07/22     Blood Glucose Monitoring Suppl (ONE TOUCH ULTRA 2) w/Device KIT Use to check glucose up to twice daily. E11.9 dx code 10/29/19   Copland, Skipper Dumas, MD  cetirizine  (ZYRTEC ) 10 MG tablet Take 1 tablet (10 mg total) by mouth daily. 07/16/16   Copland, Skipper Dumas, MD  cetirizine  (ZYRTEC ) 10 MG tablet Take 1 tablet (10 mg total) by mouth daily. Patient not taking: Reported on 03/29/2023 11/04/22   Copland, Skipper Dumas, MD  Cholecalciferol (VITAMIN D ) 50 MCG (2000 UT) tablet Take 2,000 Units by mouth daily.    [provider]  clopidogrel  (PLAVIX ) 75 MG tablet Take 1 tablet (75 mg total) by mouth daily. 11/07/22   Copland, Skipper Dumas, MD  empagliflozin  (JARDIANCE ) 10 MG TABS tablet Take 1 tablet (10 mg total) by mouth daily. Patient taking differently: Take 5 mg by mouth daily. 11/07/22     famotidine  (PEPCID ) 40 MG tablet  Take 1 tablet (40 mg total) by mouth daily. 11/07/22   Copland, Skipper Dumas, MD  FLOVENT  HFA 220 MCG/ACT inhaler INHALE 1 PUFF BY MOUTH INTO LUNGS twice A DAY. MAY INCREASE TO TWO PUFFS AS NEEDED 12/25/21   Copland, Skipper Dumas, MD  fluticasone  (FLONASE ) 50 MCG/ACT nasal spray Place 2 sprays into both nostrils daily. 12/28/21   Copland, Skipper Dumas, MD  furosemide  (LASIX ) 20 MG tablet Take 1 tablet (20 mg total) by mouth every morning. 04/11/23   Copland, Skipper Dumas, MD  gabapentin  (NEURONTIN ) 300 MG capsule Take 2 capsules (600 mg total) by mouth 2 (two) times daily. 11/07/22   Copland, Skipper Dumas, MD  glucose blood (ONETOUCH VERIO) test strip Check blood sugars once daily 02/14/23   Copland, Jessica C, MD  ipratropium (ATROVENT ) 0.03 % nasal spray Place 1 spray into both nostrils in the morning. 11/24/22   Copland, Skipper Dumas, MD  Lancets [facility] DELICA PLUS [place]) MISC Use to check blood sugars once daily. 02/14/23   Copland, Skipper Dumas, MD  linagliptin  (TRADJENTA ) 5 MG TABS tablet Take 1 tablet (5 mg total) by mouth daily. Patient taking differently: Take 2.5 mg by mouth daily. 11/07/22     lisinopril  (ZESTRIL ) 20 MG tablet Take 1 tablet (20 mg total) by mouth every morning. 05/18/23   Copland, Skipper Dumas, MD  metFORMIN  (GLUCOPHAGE -XR) 750 MG 24 hr tablet Take 1 tablet (750 mg total) by mouth 2 (two) times daily. 05/18/23  Copland, Skipper Dumas, MD  metoprolol  tartrate (LOPRESSOR ) 25 MG tablet Take 0.5 tablets (12.5 mg total) by mouth 2 (two) times daily. 05/18/23   Copland, Jessica C, MD  Multiple Vitamins-Minerals (ADULT GUMMY PO) Take 2 capsules by mouth daily.    [provider]  Spacer/Aero-Holding Ismael Maria Use with Flovent  and albuterol  inhalers 03/30/21   Crecencio Dodge, Yvonne R, DO  Ubrogepant  (UBRELVY ) 50 MG TABS Take 1 tablet (50 mg total) by mouth at onset of migraine. May repeat in 2 hours, if needed. Max dose: 2 tablets/day. This is a 30 day prescription. 11/03/22   Phebe Brasil, MD      Allergies    Influenza vaccines, Atorvastatin , Demerol, Nsaids, Pravastatin, Prednisone , Rosuvastatin , Roxicodone  [oxycodone ], Tramadol , Covid-19 (mrna) vaccine, Nortriptyline , Codeine, and Mango flavor [flavoring agent (non-screening)]   Review of Systems   Review of Systems Please see HPI for pertinent positives and negatives  Physical Exam BP (!) 106/57 (BP Location: Right Arm)   Pulse 74   Temp 97.9 F (36.6 C) (Oral)   Resp 14   Ht 5\' 1"  (1.549 m)   Wt 90.7 kg   SpO2 97%   BMI 37.78 kg/m   Physical Exam Vitals and nursing note reviewed.  Constitutional:      Appearance: Normal appearance.  HENT:     Head: Normocephalic and atraumatic.     Nose: Nose normal.     Mouth/Throat:     Mouth: Mucous membranes are moist.  Eyes:     Extraocular Movements: Extraocular movements intact.     Conjunctiva/sclera: Conjunctivae normal.  Cardiovascular:     Rate and Rhythm: Normal rate.  Pulmonary:     Effort: Pulmonary effort is normal.     Breath sounds: Normal breath sounds.  Abdominal:     General: Abdomen is flat.     Palpations: Abdomen is soft.     Tenderness: There is no abdominal tenderness.  Musculoskeletal:        General: No swelling. Normal range of motion.     Cervical back: Neck supple.  Skin:    General: Skin is warm and dry.  Neurological:     General: No focal deficit present.     Mental Status: She is alert and oriented to person, place, and time.     Cranial Nerves: No cranial nerve deficit.     Sensory: No sensory deficit.     Motor: No weakness.  Psychiatric:        Mood and Affect: Mood normal.     ED Results / Procedures / Treatments   EKG None  Procedures Procedures  Medications Ordered in the ED Medications  lactated ringers  bolus 1,000 mL (0 mLs Intravenous Stopped 07/13/23 0044)  prochlorperazine (COMPAZINE) injection 10 mg (10 mg Intravenous Given 07/12/23 2318)    Initial Impression and Plan  Patient here with headache most  consistent with migraine/cluster headache. Covid/Flu/RSV swab done in triage is normal. Will add labs due to age and reported generalized weakness. Head CT due to change in headache pattern. Compazine, IVF and NRB oxygen  for symptom control.   ED Course   Clinical Course as of 07/13/23 0048  Tue Jul 12, 2023  2348 CBC is normal.  [CS]  Wed Jul 13, 2023  0001 BMP is unremarkable.  [CS]  0037 I personally viewed the images from radiology studies and agree with radiologist interpretation: CT neg for acute process.  [CS]  [id number] Patient feeling better and symptoms resolved. She is ready to  go home. Recommend she stay hydrated, PCP follow up, RTED for any other concerns.   [CS]    Clinical Course User Index [CS] Charmayne Cooper, MD     MDM Rules/Calculators/A&P Medical Decision Making Problems Addressed: Cluster headache, not intractable, unspecified chronicity pattern: acute illness or injury  Amount and/or Complexity of Data Reviewed Labs: ordered. Decision-making details documented in ED Course. Radiology: ordered and independent interpretation performed. Decision-making details documented in ED Course.  Risk Prescription drug management.     Final Clinical Impression(s) / ED Diagnoses Final diagnoses:  Cluster headache, not intractable, unspecified chronicity pattern    Rx / DC Orders ED Discharge Orders     None        Charmayne Cooper, MD 07/13/23 [id number]

## 2023-07-12 NOTE — ED Notes (Signed)
 Applied non-rebreather mask to patient per MD order.

## 2023-07-12 NOTE — ED Triage Notes (Signed)
 Pt states headache since thursday  Also states shortness of breath and fatigue States no appetite Unknown fever but states has been hot and cold    H/o migraines

## 2023-07-13 ENCOUNTER — Ambulatory Visit: Payer: Self-pay

## 2023-07-13 DIAGNOSIS — R519 Headache, unspecified: Secondary | ICD-10-CM | POA: Diagnosis not present

## 2023-07-13 NOTE — Telephone Encounter (Signed)
 FYI Only or Action Required?: FYI only for provider  Patient was last seen in primary care on 11/03/2022 by Copland, Skipper Dumas, MD. Called Nurse Triage reporting Headache. Symptoms began several days ago. Interventions attempted: Rest, hydration, or home remedies. Symptoms are: unchanged.  Triage Disposition: See PCP When Office is Open (Within 3 Days)  Patient/caregiver understands and will follow disposition?: Yes    Copied from CRM [id number]. Topic: Clinical - Red Word Triage >> Jul 13, 2023  4:16 PM Chuck Crater wrote: Red Word that prompted transfer to Nurse Triage: Patient went to the hospital yesterday for cluster headaches and was told to talk with her pcp if it continues.  She is still having those symptoms. She has sensitivity to light due to her headache, dizziness, and shortness of breath when walking. Reason for Disposition  [1] MILD-MODERATE headache AND [2] present > 72 hours  Answer Assessment - Initial Assessment Questions 1. LOCATION: "Where does it hurt?"      Head 2. ONSET: "When did the headache start?" (Minutes, hours or days)      07/07/23 while at work 3. PATTERN: "Does the pain come and go, or has it been constant since it started?"     intermittent 4. SEVERITY: "How bad is the pain?" and "What does it keep you from doing?"  (e.g., Scale 1-10; mild, moderate, or severe)   - MILD (1-3): doesn't interfere with normal activities    - MODERATE (4-7): interferes with normal activities or awakens from sleep    - SEVERE (8-10): excruciating pain, unable to do any normal activities        Mild today was severe 5. RECURRENT SYMPTOM: "Have you ever had headaches before?" If Yes, ask: "When was the last time?" and "What happened that time?"      Yes, migranes 6. CAUSE: "What do you think is causing the headache?"     Light, working nights 7. MIGRAINE: "Have you been diagnosed with migraine headaches?" If Yes, ask: "Is this headache similar?"      Yes, less intense 8. HEAD  INJURY: "Has there been any recent injury to the head?"      NA 9. OTHER SYMPTOMS: "Do you have any other symptoms?" (fever, stiff neck, eye pain, sore throat, cold symptoms)     Tired, short of breath with ambulation  ER 07/12/23, received pain reliever, antinausea medication, oxygen . Symptoms are less severe than when in ER but persisting. Has not tried rx  Protocols used: Headache-A-AH

## 2023-07-13 NOTE — Progress Notes (Unsigned)
 Established patient visit   Patient: Sherry [name]   DOB: Jul 05, 1950   73 y.o. Female  MRN: [id number] Visit Date: 07/14/2023  Today's healthcare provider: Trenton Frock, PA-C   No chief complaint on file.  Subjective     Pt was seen in the ED 6/3 for a headache, given fluids and nausea meds. IMPRESSION: 1. No acute intracranial abnormality.   She has sensitivity to light due to her headache, dizziness, and shortness of breath when walking.  Plavix  Ubrelvy   Medications: Outpatient Medications Prior to Visit  Medication Sig   albuterol  (VENTOLIN  HFA) 108 (90 Base) MCG/ACT inhaler Inhale 2 puffs into the lungs every 6 (six) hours as needed for wheezing or shortness of breath.   beclomethasone (QVAR  REDIHALER) 80 MCG/ACT inhaler Inhale 1 puff into the lungs 2 (two) times daily. (Patient not taking: Reported on 03/29/2023)   beclomethasone (QVAR ) 80 MCG/ACT inhaler Inhale 1 puff into the lungs 2 (two) times daily.   Blood Glucose Monitoring Suppl (ONE TOUCH ULTRA 2) w/Device KIT Use to check glucose up to twice daily. E11.9 dx code   cetirizine  (ZYRTEC ) 10 MG tablet Take 1 tablet (10 mg total) by mouth daily.   cetirizine  (ZYRTEC ) 10 MG tablet Take 1 tablet (10 mg total) by mouth daily. (Patient not taking: Reported on 03/29/2023)   Cholecalciferol (VITAMIN D ) 50 MCG (2000 UT) tablet Take 2,000 Units by mouth daily.   clopidogrel  (PLAVIX ) 75 MG tablet Take 1 tablet (75 mg total) by mouth daily.   empagliflozin  (JARDIANCE ) 10 MG TABS tablet Take 1 tablet (10 mg total) by mouth daily. (Patient taking differently: Take 5 mg by mouth daily.)   famotidine  (PEPCID ) 40 MG tablet Take 1 tablet (40 mg total) by mouth daily.   FLOVENT  HFA 220 MCG/ACT inhaler INHALE 1 PUFF BY MOUTH INTO LUNGS twice A DAY. MAY INCREASE TO TWO PUFFS AS NEEDED   fluticasone  (FLONASE ) 50 MCG/ACT nasal spray Place 2 sprays into both nostrils daily.   furosemide  (LASIX ) 20 MG tablet Take 1 tablet (20 mg  total) by mouth every morning.   gabapentin  (NEURONTIN ) 300 MG capsule Take 2 capsules (600 mg total) by mouth 2 (two) times daily.   glucose blood (ONETOUCH VERIO) test strip Check blood sugars once daily   ipratropium (ATROVENT ) 0.03 % nasal spray Place 1 spray into both nostrils in the morning.   Lancets (ONETOUCH DELICA PLUS LANCET33G) MISC Use to check blood sugars once daily.   linagliptin  (TRADJENTA ) 5 MG TABS tablet Take 1 tablet (5 mg total) by mouth daily. (Patient taking differently: Take 2.5 mg by mouth daily.)   lisinopril  (ZESTRIL ) 20 MG tablet Take 1 tablet (20 mg total) by mouth every morning.   metFORMIN  (GLUCOPHAGE -XR) 750 MG 24 hr tablet Take 1 tablet (750 mg total) by mouth 2 (two) times daily.   metoprolol  tartrate (LOPRESSOR ) 25 MG tablet Take 0.5 tablets (12.5 mg total) by mouth 2 (two) times daily.   Multiple Vitamins-Minerals (ADULT GUMMY PO) Take 2 capsules by mouth daily.   Spacer/Aero-Holding Ismael Maria Use with Flovent  and albuterol  inhalers   Ubrogepant  (UBRELVY ) 50 MG TABS Take 1 tablet (50 mg total) by mouth at onset of migraine. May repeat in 2 hours, if needed. Max dose: 2 tablets/day. This is a 30 day prescription.   No facility-administered medications prior to visit.    Review of Systems {Insert previous labs (optional):23779} {See past labs  Heme  Chem  Endocrine  Serology  Results  Review (optional):1}   Objective    There were no vitals taken for this visit. {Insert last BP/Wt (optional):23777}{See vitals history (optional):1}  Physical Exam  ***  No results found for any visits on 07/14/23.  Assessment & Plan    There are no diagnoses linked to this encounter.  ***  No follow-ups on file.       Trenton Frock, PA-C  [facility] [phone] (phone) [phone] (fax)  [facility]

## 2023-07-14 ENCOUNTER — Other Ambulatory Visit (HOSPITAL_BASED_OUTPATIENT_CLINIC_OR_DEPARTMENT_OTHER): Payer: Self-pay

## 2023-07-14 ENCOUNTER — Encounter: Payer: Self-pay | Admitting: Physician Assistant

## 2023-07-14 ENCOUNTER — Ambulatory Visit: Admitting: Physician Assistant

## 2023-07-14 ENCOUNTER — Other Ambulatory Visit (HOSPITAL_BASED_OUTPATIENT_CLINIC_OR_DEPARTMENT_OTHER): Payer: Self-pay | Admitting: Family Medicine

## 2023-07-14 ENCOUNTER — Ambulatory Visit (HOSPITAL_BASED_OUTPATIENT_CLINIC_OR_DEPARTMENT_OTHER)
Admission: RE | Admit: 2023-07-14 | Discharge: 2023-07-14 | Disposition: A | Discharge status: Home / Self Care | Source: Ambulatory Visit | Attending: Physician Assistant | Admitting: Physician Assistant

## 2023-07-14 ENCOUNTER — Ambulatory Visit: Payer: Self-pay | Admitting: Physician Assistant

## 2023-07-14 VITALS — BP 123/76 | HR 74 | Ht 61.0 in | Wt 186.2 lb

## 2023-07-14 DIAGNOSIS — E1159 Type 2 diabetes mellitus with other circulatory complications: Secondary | ICD-10-CM

## 2023-07-14 DIAGNOSIS — J4521 Mild intermittent asthma with (acute) exacerbation: Secondary | ICD-10-CM | POA: Diagnosis not present

## 2023-07-14 DIAGNOSIS — R0602 Shortness of breath: Secondary | ICD-10-CM

## 2023-07-14 DIAGNOSIS — Z1231 Encounter for screening mammogram for malignant neoplasm of breast: Secondary | ICD-10-CM

## 2023-07-14 DIAGNOSIS — G44021 Chronic cluster headache, intractable: Secondary | ICD-10-CM | POA: Diagnosis not present

## 2023-07-14 DIAGNOSIS — Z471 Aftercare following joint replacement surgery: Secondary | ICD-10-CM | POA: Diagnosis not present

## 2023-07-14 DIAGNOSIS — R5383 Other fatigue: Secondary | ICD-10-CM

## 2023-07-14 DIAGNOSIS — Z96611 Presence of right artificial shoulder joint: Secondary | ICD-10-CM | POA: Diagnosis not present

## 2023-07-14 LAB — TSH: TSH: 1.78 u[IU]/mL (ref 0.35–5.50)

## 2023-07-14 LAB — HEMOGLOBIN A1C: Hgb A1c MFr Bld: 7.9 % — ABNORMAL HIGH (ref 4.6–6.5)

## 2023-07-14 MED ORDER — METHYLPREDNISOLONE 4 MG PO TBPK
ORAL_TABLET | ORAL | 0 refills | Status: AC
  Filled 2023-07-14: qty 21, 6d supply, fill #0

## 2023-07-15 ENCOUNTER — Other Ambulatory Visit: Payer: Self-pay | Admitting: Family Medicine

## 2023-07-15 ENCOUNTER — Other Ambulatory Visit: Payer: Self-pay

## 2023-07-15 ENCOUNTER — Other Ambulatory Visit (HOSPITAL_BASED_OUTPATIENT_CLINIC_OR_DEPARTMENT_OTHER): Payer: Self-pay

## 2023-07-15 DIAGNOSIS — J45901 Unspecified asthma with (acute) exacerbation: Secondary | ICD-10-CM

## 2023-07-15 LAB — BASIC METABOLIC PANEL WITH GFR
BUN: 20 mg/dL (ref 6–23)
CO2: 26 meq/L (ref 19–32)
Calcium: 9.2 mg/dL (ref 8.4–10.5)
Chloride: 104 meq/L (ref 96–112)
Creatinine, Ser: 0.84 mg/dL (ref 0.40–1.20)
GFR: 69.38 mL/min (ref >60.00)
Glucose, Bld: 180 mg/dL — ABNORMAL HIGH (ref 70–99)
Potassium: 4.2 meq/L (ref 3.5–5.1)
Sodium: 142 meq/L (ref 135–145)

## 2023-07-15 LAB — T4, FREE: Free T4: 0.93 ng/dL (ref 0.60–1.60)

## 2023-07-15 MED ORDER — FLUTICASONE PROPIONATE HFA 220 MCG/ACT IN AERO
1.0000 | INHALATION_SPRAY | Freq: Two times a day (BID) | RESPIRATORY_TRACT | 3 refills | Status: DC
  Filled 2023-07-15: qty 12, 30d supply, fill #0
  Filled 2023-08-15: qty 12, 30d supply, fill #1

## 2023-07-15 MED ORDER — FLUTICASONE PROPIONATE HFA 220 MCG/ACT IN AERO
1.0000 | INHALATION_SPRAY | Freq: Two times a day (BID) | RESPIRATORY_TRACT | 1 refills | Status: DC
  Filled 2023-07-15: qty 12, 30d supply, fill #0

## 2023-07-18 ENCOUNTER — Encounter (HOSPITAL_BASED_OUTPATIENT_CLINIC_OR_DEPARTMENT_OTHER): Payer: Self-pay

## 2023-07-18 ENCOUNTER — Ambulatory Visit (HOSPITAL_BASED_OUTPATIENT_CLINIC_OR_DEPARTMENT_OTHER)
Admission: RE | Admit: 2023-07-18 | Discharge: 2023-07-18 | Disposition: A | Discharge status: Home / Self Care | Source: Ambulatory Visit | Attending: Family Medicine | Admitting: Family Medicine

## 2023-07-18 DIAGNOSIS — Z1231 Encounter for screening mammogram for malignant neoplasm of breast: Secondary | ICD-10-CM | POA: Diagnosis not present

## 2023-07-28 ENCOUNTER — Encounter: Payer: Self-pay | Admitting: Family Medicine

## 2023-08-01 ENCOUNTER — Other Ambulatory Visit (HOSPITAL_BASED_OUTPATIENT_CLINIC_OR_DEPARTMENT_OTHER): Payer: Self-pay

## 2023-08-06 ENCOUNTER — Encounter (HOSPITAL_COMMUNITY): Payer: Self-pay | Admitting: Interventional Radiology

## 2023-08-08 ENCOUNTER — Other Ambulatory Visit (HOSPITAL_BASED_OUTPATIENT_CLINIC_OR_DEPARTMENT_OTHER): Payer: Self-pay

## 2023-08-15 ENCOUNTER — Other Ambulatory Visit: Payer: Self-pay | Admitting: Family Medicine

## 2023-08-15 ENCOUNTER — Other Ambulatory Visit: Payer: Self-pay

## 2023-08-15 ENCOUNTER — Other Ambulatory Visit (HOSPITAL_BASED_OUTPATIENT_CLINIC_OR_DEPARTMENT_OTHER): Payer: Self-pay

## 2023-08-15 DIAGNOSIS — R0982 Postnasal drip: Secondary | ICD-10-CM

## 2023-08-15 DIAGNOSIS — J45901 Unspecified asthma with (acute) exacerbation: Secondary | ICD-10-CM

## 2023-08-15 DIAGNOSIS — I10 Essential (primary) hypertension: Secondary | ICD-10-CM

## 2023-08-15 MED ORDER — METOPROLOL TARTRATE 25 MG PO TABS
12.5000 mg | ORAL_TABLET | Freq: Two times a day (BID) | ORAL | 0 refills | Status: DC
  Filled 2023-08-15: qty 90, 90d supply, fill #0

## 2023-08-15 MED ORDER — LISINOPRIL 20 MG PO TABS
20.0000 mg | ORAL_TABLET | Freq: Every morning | ORAL | 0 refills | Status: DC
  Filled 2023-08-15: qty 90, 90d supply, fill #0

## 2023-08-15 MED ORDER — IPRATROPIUM BROMIDE 0.03 % NA SOLN
1.0000 | Freq: Every morning | NASAL | 0 refills | Status: AC
  Filled 2023-08-15: qty 30, 90d supply, fill #0
  Filled 2023-11-20: qty 30, 90d supply, fill #1
  Filled 2024-02-06: qty 30, 90d supply, fill #2

## 2023-08-15 MED ORDER — FUROSEMIDE 20 MG PO TABS
20.0000 mg | ORAL_TABLET | Freq: Every morning | ORAL | 0 refills | Status: DC
  Filled 2023-08-15: qty 90, 90d supply, fill #0

## 2023-08-15 MED ORDER — METFORMIN HCL ER 750 MG PO TB24
750.0000 mg | ORAL_TABLET | Freq: Two times a day (BID) | ORAL | 0 refills | Status: DC
  Filled 2023-08-15: qty 180, 90d supply, fill #0

## 2023-08-16 ENCOUNTER — Other Ambulatory Visit (HOSPITAL_COMMUNITY): Payer: Self-pay

## 2023-08-16 ENCOUNTER — Telehealth: Payer: Self-pay

## 2023-08-16 NOTE — Telephone Encounter (Signed)
 Pharmacy Patient Advocate Encounter   Received notification from Pt Calls Messages that prior authorization for Fluticasone  is required/requested.   Insurance verification completed.   The patient is insured through [facility] .   Per test claim: PA required; PA submitted to above mentioned insurance via CoverMyMeds Key/confirmation #/EOC [id number] Status is pending

## 2023-08-17 ENCOUNTER — Other Ambulatory Visit (HOSPITAL_BASED_OUTPATIENT_CLINIC_OR_DEPARTMENT_OTHER): Payer: Self-pay

## 2023-08-17 NOTE — Telephone Encounter (Signed)
 Pharmacy Patient Advocate Encounter  Received notification from [facility] that Prior Authorization for Flovent  HFA 220mcg has been DENIED.  See denial reason below. No denial letter attached in CMM. Will attach denial letter to Media tab once received.   PA #/Case ID/Reference #: [id number]

## 2023-08-18 ENCOUNTER — Other Ambulatory Visit (HOSPITAL_BASED_OUTPATIENT_CLINIC_OR_DEPARTMENT_OTHER): Payer: Self-pay

## 2023-08-18 MED ORDER — ARNUITY ELLIPTA 200 MCG/ACT IN AEPB
1.0000 | INHALATION_SPRAY | Freq: Every day | RESPIRATORY_TRACT | 3 refills | Status: AC
  Filled 2023-08-18: qty 90, 90d supply, fill #0
  Filled 2023-11-20: qty 90, 90d supply, fill #1
  Filled 2024-02-06: qty 90, 90d supply, fill #2

## 2023-08-18 NOTE — Addendum Note (Signed)
 Addended by: WATT RAISIN C on: 08/18/2023 04:10 PM   Modules accepted: Orders

## 2023-08-19 ENCOUNTER — Other Ambulatory Visit (HOSPITAL_BASED_OUTPATIENT_CLINIC_OR_DEPARTMENT_OTHER): Payer: Self-pay

## 2023-08-22 ENCOUNTER — Other Ambulatory Visit (HOSPITAL_BASED_OUTPATIENT_CLINIC_OR_DEPARTMENT_OTHER): Payer: Self-pay

## 2023-08-26 ENCOUNTER — Other Ambulatory Visit (HOSPITAL_BASED_OUTPATIENT_CLINIC_OR_DEPARTMENT_OTHER): Payer: Self-pay

## 2023-10-05 ENCOUNTER — Telehealth: Payer: Self-pay | Admitting: Family Medicine

## 2023-10-05 NOTE — Telephone Encounter (Signed)
 Pt dropped of paper to be filed out by pcp for handicap sign. Left paper in pcps box. Please call pt when ready to be picked up.

## 2023-10-06 ENCOUNTER — Emergency Department (HOSPITAL_BASED_OUTPATIENT_CLINIC_OR_DEPARTMENT_OTHER)
Admission: EM | Admit: 2023-10-06 | Discharge: 2023-10-07 | Disposition: A | Discharge status: Home / Self Care | Attending: Emergency Medicine | Admitting: Emergency Medicine

## 2023-10-06 ENCOUNTER — Encounter (HOSPITAL_BASED_OUTPATIENT_CLINIC_OR_DEPARTMENT_OTHER): Payer: Self-pay | Admitting: Emergency Medicine

## 2023-10-06 ENCOUNTER — Other Ambulatory Visit: Payer: Self-pay

## 2023-10-06 ENCOUNTER — Emergency Department (HOSPITAL_BASED_OUTPATIENT_CLINIC_OR_DEPARTMENT_OTHER)

## 2023-10-06 DIAGNOSIS — K449 Diaphragmatic hernia without obstruction or gangrene: Secondary | ICD-10-CM | POA: Diagnosis not present

## 2023-10-06 DIAGNOSIS — R0609 Other forms of dyspnea: Secondary | ICD-10-CM | POA: Insufficient documentation

## 2023-10-06 DIAGNOSIS — R600 Localized edema: Secondary | ICD-10-CM | POA: Diagnosis not present

## 2023-10-06 DIAGNOSIS — J45909 Unspecified asthma, uncomplicated: Secondary | ICD-10-CM | POA: Insufficient documentation

## 2023-10-06 DIAGNOSIS — M7989 Other specified soft tissue disorders: Secondary | ICD-10-CM | POA: Diagnosis not present

## 2023-10-06 DIAGNOSIS — R0600 Dyspnea, unspecified: Secondary | ICD-10-CM | POA: Diagnosis not present

## 2023-10-06 DIAGNOSIS — Z96611 Presence of right artificial shoulder joint: Secondary | ICD-10-CM | POA: Diagnosis not present

## 2023-10-06 DIAGNOSIS — R0602 Shortness of breath: Secondary | ICD-10-CM | POA: Diagnosis not present

## 2023-10-06 DIAGNOSIS — Z7901 Long term (current) use of anticoagulants: Secondary | ICD-10-CM | POA: Diagnosis not present

## 2023-10-06 DIAGNOSIS — E049 Nontoxic goiter, unspecified: Secondary | ICD-10-CM | POA: Diagnosis not present

## 2023-10-06 LAB — CBC
HCT: 42.8 % (ref 36.0–46.0)
Hemoglobin: 13.5 g/dL (ref 12.0–15.0)
MCH: 28.6 pg (ref 26.0–34.0)
MCHC: 31.5 g/dL (ref 30.0–36.0)
MCV: 90.7 fL (ref 80.0–100.0)
Platelets: 305 K/uL (ref 150–400)
RBC: 4.72 MIL/uL (ref 3.87–5.11)
RDW: 14.4 % (ref 11.5–15.5)
WBC: 7.2 K/uL (ref 4.0–10.5)
nRBC: 0 % (ref 0.0–0.2)

## 2023-10-06 LAB — BASIC METABOLIC PANEL WITH GFR
Anion gap: 14 (ref 5–15)
BUN: 17 mg/dL (ref 8–23)
CO2: 25 mmol/L (ref 22–32)
Calcium: 9.3 mg/dL (ref 8.9–10.3)
Chloride: 106 mmol/L (ref 98–111)
Creatinine, Ser: 0.8 mg/dL (ref 0.44–1.00)
GFR, Estimated: >60 mL/min (ref >60)
Glucose, Bld: 146 mg/dL — ABNORMAL HIGH (ref 70–99)
Potassium: 3.7 mmol/L (ref 3.5–5.1)
Sodium: 145 mmol/L (ref 135–145)

## 2023-10-06 LAB — TROPONIN T, HIGH SENSITIVITY: Troponin T High Sensitivity: <15 ng/L (ref 0–19)

## 2023-10-06 LAB — PRO BRAIN NATRIURETIC PEPTIDE: Pro Brain Natriuretic Peptide: 115 pg/mL (ref <300.0)

## 2023-10-06 MED ORDER — IOHEXOL 350 MG/ML SOLN
75.0000 mL | Freq: Once | INTRAVENOUS | Status: AC | PRN
  Administered 2023-10-06: 75 mL via INTRAVENOUS

## 2023-10-06 NOTE — ED Provider Notes (Signed)
 [place] [facility] AT [facility] Provider Note   CSN: [id number] Arrival date & time: 10/06/23  2020     Patient presents with: Leg Swelling   Sherry [name] is a 73 y.o. female with h/o goiter, dependent edema, asthma presents to the emergency department today for evaluation of lower leg edema right greater than left with dyspnea on exertion for the past 2 weeks.  She reports an occasional nonproductive cough but reports that she has been having worsening dyspnea on exertion.  Reports that she feels short of breath and fatigued with walking to her mailbox or walking up the stairs.  She reports she has been compliant with her Lasix .  She is not having any chest pain.  She reports that she has been wearing her compression stockings and took them off prior to arrival.  She denies any runny nose, nasal congestion, fever, abdominal pain, nausea, vomiting, dysuria, hematuria.  She is not sure what her dry weight is.  Denies any history of DVT/PE.  Denies any recent surgery, exogenous hormone use, or recent travel.   HPI     Prior to Admission medications   Medication Sig Start Date End Date Taking? Authorizing Provider  albuterol  (VENTOLIN  HFA) 108 (90 Base) MCG/ACT inhaler Inhale 2 puffs into the lungs every 6 (six) hours as needed for wheezing or shortness of breath. 04/28/20   Copland, Harlene BROCKS, MD  Blood Glucose Monitoring Suppl (ONE TOUCH ULTRA 2) w/Device KIT Use to check glucose up to twice daily. E11.9 dx code 10/29/19   Copland, Harlene BROCKS, MD  cetirizine  (ZYRTEC ) 10 MG tablet Take 1 tablet (10 mg total) by mouth daily. Patient not taking: Reported on 03/29/2023 11/04/22   Copland, Harlene BROCKS, MD  Cholecalciferol (VITAMIN D ) 50 MCG (2000 UT) tablet Take 2,000 Units by mouth daily.    [provider]  clopidogrel  (PLAVIX ) 75 MG tablet Take 1 tablet (75 mg total) by mouth daily. 11/07/22   Copland, Harlene BROCKS, MD  empagliflozin  (JARDIANCE ) 10 MG TABS tablet  Take 1 tablet (10 mg total) by mouth daily. Patient taking differently: Take 5 mg by mouth daily. 11/07/22     fluticasone  (FLONASE ) 50 MCG/ACT nasal spray Place 2 sprays into both nostrils daily. 12/28/21   Copland, Harlene BROCKS, MD  Fluticasone  Furoate (ARNUITY ELLIPTA ) 200 MCG/ACT AEPB Inhale 1 puff (200 mcg) into the lungs daily. 08/18/23   Copland, Harlene BROCKS, MD  furosemide  (LASIX ) 20 MG tablet Take 1 tablet (20 mg total) by mouth every morning. 08/15/23   Copland, Harlene BROCKS, MD  gabapentin  (NEURONTIN ) 300 MG capsule Take 2 capsules (600 mg total) by mouth 2 (two) times daily. 11/07/22   Copland, Harlene BROCKS, MD  glucose blood (ONETOUCH VERIO) test strip Check blood sugars once daily 02/14/23   Copland, Jessica C, MD  ipratropium (ATROVENT ) 0.03 % nasal spray Place 1 spray into both nostrils in the morning. 08/15/23   Copland, Harlene BROCKS, MD  Lancets [facility] DELICA PLUS [place]) MISC Use to check blood sugars once daily. 02/14/23   Copland, Harlene BROCKS, MD  linagliptin  (TRADJENTA ) 5 MG TABS tablet Take 1 tablet (5 mg total) by mouth daily. Patient taking differently: Take 2.5 mg by mouth daily. 11/07/22     lisinopril  (ZESTRIL ) 20 MG tablet Take 1 tablet (20 mg total) by mouth every morning. 08/15/23   Copland, Harlene BROCKS, MD  metFORMIN  (GLUCOPHAGE -XR) 750 MG 24 hr tablet Take 1 tablet (750 mg total) by mouth 2 (two) times  daily. 08/15/23   Copland, Harlene BROCKS, MD  metoprolol  tartrate (LOPRESSOR ) 25 MG tablet Take 0.5 tablets (12.5 mg total) by mouth 2 (two) times daily. 08/15/23   Copland, Harlene BROCKS, MD  Multiple Vitamins-Minerals (ADULT GUMMY PO) Take 2 capsules by mouth daily.    [provider]  Spacer/Aero-Holding Raguel FRENCH Use with Flovent  and albuterol  inhalers 03/30/21   Antonio Meth, Jamee SAUNDERS, DO  Ubrogepant  (UBRELVY ) 50 MG TABS Take 1 tablet (50 mg total) by mouth at onset of migraine. May repeat in 2 hours, if needed. Max dose: 2 tablets/day. This is a 30 day prescription. 11/03/22   Onita Duos, MD     Allergies: Influenza vaccines, Atorvastatin , Demerol, Nsaids, Pravastatin, Prednisone , Rosuvastatin , Roxicodone  [oxycodone ], Tramadol , Covid-19 (mrna) vaccine, Nortriptyline , Codeine, and Mango flavor [flavoring agent (non-screening)]    Review of Systems  Constitutional:  Negative for chills and fever.  Respiratory:  Positive for cough and shortness of breath.   Cardiovascular:  Positive for leg swelling. Negative for chest pain.  Gastrointestinal:  Negative for abdominal pain, diarrhea, nausea and vomiting.    Updated Vital Signs BP 138/71   Pulse 73   Temp 98.3 F (36.8 C)   Resp 15   Ht 5' 1 (1.549 m)   Wt 88 kg   SpO2 98%   BMI 36.66 kg/m   Physical Exam Vitals and nursing note reviewed.  Constitutional:      General: She is not in acute distress.    Appearance: She is not ill-appearing or toxic-appearing.  HENT:     Mouth/Throat:     Mouth: Mucous membranes are moist.  Eyes:     General: No scleral icterus. Cardiovascular:     Rate and Rhythm: Normal rate.     Pulses: Normal pulses.          Radial pulses are 2+ on the right side and 2+ on the left side.       Dorsalis pedis pulses are 2+ on the right side and 2+ on the left side.       Posterior tibial pulses are 2+ on the right side and 2+ on the left side.  Pulmonary:     Effort: Pulmonary effort is normal. No respiratory distress.     Breath sounds: Normal breath sounds. No wheezing.     Comments: Lungs are clear to auscultation bilaterally.  Patient speaking full senses with ease, satting well room air without increased work of breathing. Abdominal:     Palpations: Abdomen is soft.     Tenderness: There is no abdominal tenderness. There is no guarding or rebound.  Musculoskeletal:     Right lower leg: 2+ Edema present.     Left lower leg: 2+ Edema present.     Comments: Coloration, temperature, appear and feel symmetric bilaterally.  Right leg may be slightly larger than left.  DP and PT pulses  palpable bilaterally.  Compartments are soft but patient does have 2+ pitting edema.  Skin:    General: Skin is warm and dry.  Neurological:     Mental Status: She is alert.     Comments: Strength intact to the bilateral lower extremities.      (all labs ordered are listed, but only abnormal results are displayed) Labs Reviewed  BASIC METABOLIC PANEL WITH GFR - Abnormal; Notable for the following components:      Result Value   Glucose, Bld 146 (*)    All other components within normal limits  CBC  PRO  BRAIN NATRIURETIC PEPTIDE  TROPONIN T, HIGH SENSITIVITY  TROPONIN T, HIGH SENSITIVITY    EKG: EKG Interpretation Date/Time:  Thursday October 06 2023 23:41:15 EDT Ventricular Rate:  70 PR Interval:  159 QRS Duration:  92 QT Interval:  405 QTC Calculation: 437 R Axis:   84  Text Interpretation: Sinus rhythm Borderline right axis deviation Borderline T abnormalities, diffuse leads NO SIGNIFICANT CHANGE SINCE LAST TRACING YESTERDAY Confirmed by Haze Lonni PARAS [phone]) on 10/07/2023 12:29:46 AM  Radiology: DG Chest 2 View Result Date: 10/06/2023 CLINICAL DATA:  Shortness of breath. EXAM: CHEST - 2 VIEW COMPARISON:  Chest radiograph dated 07/14/2023. FINDINGS: No focal consolidation, pleural effusion, or pneumothorax. The cardiac silhouette. No acute osseous pathology. Degenerative changes of the spine. Total right shoulder arthroplasty. IMPRESSION: No active cardiopulmonary disease. Electronically Signed   By: Vanetta Chou M.D.   On: 10/06/2023 21:35   Procedures   Medications Ordered in the ED - No data to display    Medical Decision Making Amount and/or Complexity of Data Reviewed Labs: ordered. Radiology: ordered.  Risk Prescription drug management.   73 y.o. female presents to the ER for evaluation of SOB and bilateral leg swelling R>L. Differential diagnosis includes but is not limited to CHF, pericardial effusion/tamponade, arrhythmias, ACS, COPD, asthma,  bronchitis, pneumonia, pneumothorax, PE, anemia, dependent edema, DVT. Vital signs unremarkable. Physical exam as noted above.   I independently reviewed and interpreted the patient's labs.  CBC without cytosis or anemia.  BMP shows glucose of 146 otherwise unremarkable.  Troponin less than 15.  proBNP within normal limits.  Chest x-ray shows no acute cardiopulmonary process. Per radiologist's interpretation.   Will obtain CTA given lower leg edema with right greater than left.  She has never had a DVT or PE before.  EKG read by my attending.   CTA pending.  Will handoff to oncoming shift for reevaluation.  12:17 AM Care of Sherry [name]  transferred  Dr. Haze at the end of my shift as the patient will require reassessment once labs/imaging have resulted. Patient presentation, ED course, and plan of care discussed with review of all pertinent labs and imaging. Please see his/her note for further details regarding further ED course and disposition. Plan at time of handoff is follow up with CTA. If normal patient can return for DVT study and be discharged home. This may be altered or completely changed at the discretion of the oncoming team pending results of further workup.   Portions of this report may have been transcribed using voice recognition software. Every effort was made to ensure accuracy; however, inadvertent computerized transcription errors may be present.    Final diagnoses:  None    ED Discharge Orders     None          Bernis Ernst, [place] 10/07/23 0034    Haze Lonni PARAS, MD 10/07/23 [phone]

## 2023-10-06 NOTE — ED Triage Notes (Signed)
 Pt c/o BLLE swelling x 1 week, R worse than left. Also reports dyspnea on exertion.

## 2023-10-07 DIAGNOSIS — M7989 Other specified soft tissue disorders: Secondary | ICD-10-CM | POA: Diagnosis not present

## 2023-10-07 DIAGNOSIS — R0600 Dyspnea, unspecified: Secondary | ICD-10-CM | POA: Diagnosis not present

## 2023-10-07 DIAGNOSIS — K449 Diaphragmatic hernia without obstruction or gangrene: Secondary | ICD-10-CM | POA: Diagnosis not present

## 2023-10-07 LAB — TROPONIN T, HIGH SENSITIVITY: Troponin T High Sensitivity: <15 ng/L (ref 0–19)

## 2023-10-07 NOTE — Discharge Instructions (Addendum)
 Call your doctor in the morning to schedule a follow-up for next week.  Take double your Lasix  dose each day for the next 5 days.

## 2023-10-07 NOTE — Telephone Encounter (Signed)
Form has been placed in PCP folder.

## 2023-10-11 NOTE — Progress Notes (Unsigned)
 [facility] [address], [address] [place], [place] [address] [phone]  Date:  10/12/2023   Name:  Sherry [name]   DOB:  1950-03-20   MRN:  [id number]  PCP:  Watt Harlene BROCKS, MD    Chief Complaint: No chief complaint on file.   History of Present Illness:  Sherry [name] is a 73 y.o. very pleasant female patient who presents with the following:  Pt seen today   Discussed the use of AI scribe software for clinical note transcription with the patient, who gave verbal consent to proceed.  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   Spondylosis without myelopathy or radiculopathy, cervical region 03/29/2017   Bilateral carpal tunnel syndrome 03/29/2017   Acute asthma exacerbation 08/19/2016   Cervicalgia 03/23/2016   Vertigo 04/14/2015   Anxiety state 01/22/2015   HLD (hyperlipidemia) 01/22/2015   Type 2 diabetes mellitus with circulatory disorder (HCC) 01/22/2015   Cerebral vascular disease 01/22/2015   Aneurysm (HCC)    Headache    Essential hypertension 11/12/2014   Dependent edema 11/12/2014   Peripheral neuropathy 11/12/2014   Depression 11/12/2014   Facial droop    TIA (transient ischemic attack) 11/11/2014   Hyperthyroidism 08/06/2014   Meniere's disease 06/07/2014   Goiter 06/13/2013   Obesity, unspecified 06/13/2013    Past Medical History:  Diagnosis Date   Allergy 1970   Demerol, FLu vaccine, NSAIDS, Codiene   Anxiety 2002   Ongoing   Asthma    Cataract    Small ones found by opthamolgist   Cerebral aneurysm    being watched by Dr. Myrtis every 6 months   CHF (congestive heart failure)  (HCC)    Resolved   Complication of anesthesia HYPOTENSION INTRAOP W/ HYSTERECTOMY IN 1987--  DID OK W/ TOTAL KNEE IN 2008   COPD (chronic obstructive pulmonary disease) (HCC) 1970   Bronchitits   Depression 1970   Diabetes mellitus    DJD (degenerative joint disease)    Dyslipidemia    GERD (gastroesophageal reflux disease)    H/O hiatal hernia    History of CHF (congestive heart failure) 2010-  RESOLVED   History of peptic ulcer YRS AGO   Hypertension    Meniere's disease    Mitral valve prolapse occasional palpitations w/ chest discomfort   Neuromuscular disorder (HCC) 2002   Peripheral neuropathy   OA (osteoarthritis)    Rotator cuff tear, left    Stroke (HCC)    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    Past Surgical History:  Procedure Laterality Date   BUNIONECTOMY  02/08/2005   LEFT FOOT   FRACTURE SURGERY  07/30/2019   (R) Reverse Shoulder Replacement   IR 3D INDEPENDENT WKST  01/06/2021   IR ANGIO INTRA EXTRACRAN SEL COM CAROTID INNOMINATE BILAT MOD SED  03/22/2019   IR ANGIO INTRA EXTRACRAN SEL COM CAROTID INNOMINATE BILAT MOD SED  01/06/2021   IR ANGIO VERTEBRAL SEL SUBCLAVIAN INNOMINATE UNI L MOD SED  03/22/2019   IR ANGIO VERTEBRAL SEL VERTEBRAL UNI R MOD SED  03/22/2019   IR ANGIO VERTEBRAL SEL VERTEBRAL UNI R MOD SED  01/06/2021  IR US  GUIDE VASC ACCESS RIGHT  03/22/2019   IR US  GUIDE VASC ACCESS RIGHT  01/06/2021   JOINT REPLACEMENT  2008, 2011,2021   Total knee replacement, '08 (R), '11 (L)   LEFT SHOULDER SURGERY  02/09/1995   ROTATOR CUFF REPAIR   REVERSE SHOULDER ARTHROPLASTY Right 07/30/2020   Procedure: REVERSE SHOULDER ARTHROPLASTY;  Surgeon: Cristy Bonner DASEN, MD;  Location: [facility];  Service: Orthopedics;  Laterality: Right;   SHOULDER ARTHROSCOPY  09/20/2011   Procedure: ARTHROSCOPY SHOULDER;  Surgeon: Reyes JAYSON Billing, MD;  [facility];  Service: Orthopedics;  Laterality: Left;  SAD AND MINI OPEN ROTATOR CUFF REPAIR      TOTAL KNEE ARTHROPLASTY  09-12-2006  [facility]   RIGHT KNEE   TOTAL KNEE ARTHROPLASTY Left 11/30/2017   Procedure: LEFT TOTAL KNEE REPLACEMENT;  Surgeon: Barbarann Oneil JAYSON, MD;  Location: [facility];  Service: Orthopedics;  Laterality: Left;   TUBAL LIGATION  02/08/1974   TYMPANOPLASTY  1990;   1980   VAGINAL HYSTERECTOMY  02/08/1985    Social History   Tobacco Use   Smoking status: Never    Passive exposure: Never   Smokeless tobacco: Never  Vaping Use   Vaping status: Never Used  Substance Use Topics   Alcohol use: No   Drug use: No    Family History  Problem Relation Age of Onset   Heart disease Mother        in her 32s, heart attack   Diabetes Mother    Kidney disease Mother        dialysis   Thyroid  disease Mother    Aneurysm Mother    Ovarian cancer Mother    Breast cancer Mother    Arthritis Mother    Cancer Mother    Depression Mother    Stroke Mother    Thyroid  disease Sister    Heart disease Sister    Diabetes Sister    Hyperlipidemia Sister    Hypertension Sister    Lymphoma Father    Diabetes Father    Hypertension Father    Congestive Heart Failure Father    Arthritis Father    Cancer Father    Vision loss Father    Thyroid  disease Brother    Hyperlipidemia Brother    Hypertension Brother    Obesity Brother    Thyroid  disease Paternal Grandmother    Arthritis Paternal Grandmother    Heart attack Sister        at age 63   Stroke Sister    Hypertension Sister    Arthritis Sister    Asthma Sister    Cancer Sister    Diabetes Sister    Hearing loss Sister    Hyperlipidemia Sister    Obesity Sister    Vision loss Sister    Breast cancer Maternal Aunt    Breast cancer Maternal Grandmother    Anxiety disorder Maternal Grandmother    Breast cancer Maternal Aunt    Breast cancer Maternal Aunt    COPD Paternal Grandfather    Early death Paternal Grandfather    Other Brother        Set designer accident   Drug abuse Brother    Bipolar disorder Son    Other  Son        GSW   Early death Son    ADD / ADHD Daughter    Diabetes Daughter    Anxiety disorder Daughter    Diabetes Daughter    Obesity Daughter  Early death Brother     Allergies  Allergen Reactions   Influenza Vaccines Anaphylaxis   Atorvastatin  Other (See Comments)    Joint pain / myalgias   Demerol Other (See Comments)    SEIZURE   Nsaids Other (See Comments)    NAPROXEN, IBUPROFEN, SKELAXIN---  CAUSES PALPITATIONS   Pravastatin Other (See Comments)    Myalgias    Prednisone  Other (See Comments)    Caused her glucose to go up to 500 and went to ED   Rosuvastatin  Other (See Comments)    Joint stiffness and muscle pain   Roxicodone  [Oxycodone ] Itching and Other (See Comments)    Hallucinations and itching   Tramadol  Other (See Comments)    Contraindicated with Victoza     Covid-19 (Mrna) Vaccine     Patient had anaphylactic reaction to flu vaccine   Nortriptyline  Other (See Comments)    Severe depression   Codeine Itching   Mango Flavor [Flavoring Agent (Non-Screening)] Itching    Medication list has been reviewed and updated.  Current Outpatient Medications on File Prior to Visit  Medication Sig Dispense Refill   albuterol  (VENTOLIN  HFA) 108 (90 Base) MCG/ACT inhaler Inhale 2 puffs into the lungs every 6 (six) hours as needed for wheezing or shortness of breath. 18 g 2   Blood Glucose Monitoring Suppl (ONE TOUCH ULTRA 2) w/Device KIT Use to check glucose up to twice daily. E11.9 dx code 1 kit 0   cetirizine  (ZYRTEC ) 10 MG tablet Take 1 tablet (10 mg total) by mouth daily. (Patient not taking: Reported on 03/29/2023) 90 tablet 3   Cholecalciferol (VITAMIN D ) 50 MCG (2000 UT) tablet Take 2,000 Units by mouth daily.     clopidogrel  (PLAVIX ) 75 MG tablet Take 1 tablet (75 mg total) by mouth daily. 90 tablet 3   empagliflozin  (JARDIANCE ) 10 MG TABS tablet Take 1 tablet (10 mg total) by mouth daily. (Patient taking differently: Take 5 mg by mouth daily.) 90 tablet 3    fluticasone  (FLONASE ) 50 MCG/ACT nasal spray Place 2 sprays into both nostrils daily. 16 g 6   Fluticasone  Furoate (ARNUITY ELLIPTA ) 200 MCG/ACT AEPB Inhale 1 puff (200 mcg) into the lungs daily. 90 each 3   furosemide  (LASIX ) 20 MG tablet Take 1 tablet (20 mg total) by mouth every morning. 90 tablet 0   gabapentin  (NEURONTIN ) 300 MG capsule Take 2 capsules (600 mg total) by mouth 2 (two) times daily. 360 capsule 3   glucose blood (ONETOUCH VERIO) test strip Check blood sugars once daily 100 each 12   ipratropium (ATROVENT ) 0.03 % nasal spray Place 1 spray into both nostrils in the morning. 90 mL 0   Lancets (ONETOUCH DELICA PLUS LANCET33G) MISC Use to check blood sugars once daily. 100 each 12   linagliptin  (TRADJENTA ) 5 MG TABS tablet Take 1 tablet (5 mg total) by mouth daily. (Patient taking differently: Take 2.5 mg by mouth daily.) 90 tablet 3   lisinopril  (ZESTRIL ) 20 MG tablet Take 1 tablet (20 mg total) by mouth every morning. 90 tablet 0   metFORMIN  (GLUCOPHAGE -XR) 750 MG 24 hr tablet Take 1 tablet (750 mg total) by mouth 2 (two) times daily. 180 tablet 0   metoprolol  tartrate (LOPRESSOR ) 25 MG tablet Take 0.5 tablets (12.5 mg total) by mouth 2 (two) times daily. 90 tablet 0   Multiple Vitamins-Minerals (ADULT GUMMY PO) Take 2 capsules by mouth daily.     Spacer/Aero-Holding Raguel FRENCH Use with Flovent  and albuterol  inhalers 1 each 0  Ubrogepant  (UBRELVY ) 50 MG TABS Take 1 tablet (50 mg total) by mouth at onset of migraine. May repeat in 2 hours, if needed. Max dose: 2 tablets/day. This is a 30 day prescription. 12 tablet 11   No current facility-administered medications on file prior to visit.    Review of Systems:  ***  Physical Examination: There were no vitals filed for this visit. There were no vitals filed for this visit. There is no height or weight on file to calculate BMI. Ideal Body Weight:    ***  Assessment and Plan: No diagnosis found.  Assessment &  Plan   Signed Harlene Schroeder, MD

## 2023-10-11 NOTE — Telephone Encounter (Signed)
 Copied from CRM #[id number]. Topic: General - Other >> Oct 11, 2023  4:27 PM Rosina BIRCH wrote: Reason for CRM: patient called stating she want the status of her handicap form that she dropped off last week [phone]

## 2023-10-12 ENCOUNTER — Encounter: Payer: Self-pay | Admitting: Family Medicine

## 2023-10-12 ENCOUNTER — Ambulatory Visit (INDEPENDENT_AMBULATORY_CARE_PROVIDER_SITE_OTHER)

## 2023-10-12 ENCOUNTER — Ambulatory Visit (INDEPENDENT_AMBULATORY_CARE_PROVIDER_SITE_OTHER): Admitting: Family Medicine

## 2023-10-12 VITALS — BP 122/84 | HR 77 | Ht 61.0 in | Wt 195.2 lb

## 2023-10-12 DIAGNOSIS — R600 Localized edema: Secondary | ICD-10-CM

## 2023-10-12 DIAGNOSIS — E1159 Type 2 diabetes mellitus with other circulatory complications: Secondary | ICD-10-CM

## 2023-10-12 DIAGNOSIS — R0602 Shortness of breath: Secondary | ICD-10-CM

## 2023-10-12 DIAGNOSIS — I10 Essential (primary) hypertension: Secondary | ICD-10-CM

## 2023-10-12 LAB — TROPONIN I (HIGH SENSITIVITY): High Sens Troponin I: 5 ng/L (ref 2–17)

## 2023-10-12 LAB — COMPREHENSIVE METABOLIC PANEL WITH GFR
ALT: 14 U/L (ref 0–35)
AST: 13 U/L (ref 0–37)
Albumin: 4.2 g/dL (ref 3.5–5.2)
Alkaline Phosphatase: 71 U/L (ref 39–117)
BUN: 14 mg/dL (ref 6–23)
CO2: 34 meq/L — ABNORMAL HIGH (ref 19–32)
Calcium: 9.3 mg/dL (ref 8.4–10.5)
Chloride: 101 meq/L (ref 96–112)
Creatinine, Ser: 0.74 mg/dL (ref 0.40–1.20)
GFR: 80.64 mL/min (ref >60.00)
Glucose, Bld: 149 mg/dL — ABNORMAL HIGH (ref 70–99)
Potassium: 3.6 meq/L (ref 3.5–5.1)
Sodium: 143 meq/L (ref 135–145)
Total Bilirubin: 0.8 mg/dL (ref 0.2–1.2)
Total Protein: 6.7 g/dL (ref 6.0–8.3)

## 2023-10-12 LAB — BRAIN NATRIURETIC PEPTIDE: Pro B Natriuretic peptide (BNP): 28 pg/mL (ref 0.0–100.0)

## 2023-10-12 NOTE — Telephone Encounter (Signed)
 Pt has OV today 10/12/2023.

## 2023-10-12 NOTE — Patient Instructions (Addendum)
 Good to see see you again today- please stop by lab and then go to imaging on the ground floor to set up your leg ultrasound asap I will also get in touch with Dr Mona and ask him to bring you in for a recheck when he can

## 2023-10-17 ENCOUNTER — Other Ambulatory Visit (HOSPITAL_COMMUNITY): Payer: Self-pay

## 2023-10-17 ENCOUNTER — Ambulatory Visit: Attending: Internal Medicine | Admitting: Internal Medicine

## 2023-10-17 ENCOUNTER — Encounter: Payer: Self-pay | Admitting: Internal Medicine

## 2023-10-17 VITALS — BP 132/84 | HR 84 | Ht 61.0 in | Wt 198.0 lb

## 2023-10-17 DIAGNOSIS — R0602 Shortness of breath: Secondary | ICD-10-CM

## 2023-10-17 DIAGNOSIS — R600 Localized edema: Secondary | ICD-10-CM

## 2023-10-17 DIAGNOSIS — I10 Essential (primary) hypertension: Secondary | ICD-10-CM | POA: Diagnosis not present

## 2023-10-17 MED ORDER — FUROSEMIDE 40 MG PO TABS
40.0000 mg | ORAL_TABLET | Freq: Every day | ORAL | 1 refills | Status: DC
  Filled 2023-10-17: qty 90, 90d supply, fill #0

## 2023-10-17 NOTE — Progress Notes (Signed)
 OFFICE FOLLOW-UP NOTE  Chief Complaint:  Dyspnea and lower extremity edema  Primary Care Physician: Watt Harlene BROCKS, MD  HPI:  Sherry [name] is a 73 y.o. female with a past medial history significant for cerebral aneurysm (not coiled), recurrent TIA, type 2 diabetes, dyslipidemia, mitral valve prolapse, reported congestive heart failure with normal LV EF on echo, hypertension, GERD and hiatal hernia, and degenerative joint disease with prior total knee replacement, who presents with left knee pain desiring left total knee replacement by Dr. Addie. She previously saw Dr. Delford in 2016, but does not recall the visit. Later she mentioned that she wanted to see me because he did not have any appointments. At the time she presented for intermittent chest discomfort which is described as sharp and fleeting. This was thought to be atypical. She underwent an echocardiogram in stress Myoview  in 2016 which was negative for ischemia. She was also consult a by cardiac EP for an implanted loop recorder but declined at the time. She had a repeat echo in March 2017 which showed EF 60-65%, normal LV wall thickness, no regional wall motion abnormalities. No diagnostic mitral valve prolapse was noted. Interestingly, she was told the past that she may need mitral valve replacement however there is no evidence of significant dysfunction of the mitral valve. She denies any chest pain or worsening shortness of breath with exertion and can do more than 4 METS of activity.  11/16/2017  Sherry [name] returns today for preoperative risk assessment.  I saw her for evaluation for knee replacement by Dr. Addie in 2018 however she did not undergo that surgery.  Her knee is now significantly worsened and she is scheduled to have knee replacement with Dr. Barbarann in October.  After I saw her last year than she was admitted to the hospital for possible CHF however felt to have asthma exacerbation.  She underwent nuclear  stress testing and was seen in follow-up by Katheryn Jude, NP.  Her nuclear stress test was low risk and since then she is done well.  She denies any further chest pain or worsening shortness of breath.  Her activity level significantly decreased due to her left knee which she has trouble bending.  She lost her husband this past year as well which is put a lot of stress on her.  Despite that she denies any anginal symptoms.  10/17/2023  Sherry [name] is here to reestablish care.  I last saw her in 2019 and she is considered a new patient.  She was referred by Dr. Watt after recent ER visit for lower extremity swelling and shortness of breath.  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  She notes that recently she has had some worsening lower extremity swelling and shortness of breath, particularly walking up stairs.  She has had this in the past and underwent stress testing in 2019 which was negative.  She was on 20 mg daily Lasix  however was advised recently to increase her Lasix  to 40 mg daily.  She did this for 5 days and noted improvement in her swelling but then returned back to 20 mg daily as instructed.  Workup in the emergency department for shortness of breath was unrevealing including a negative proBNP, negative troponins, CT angiogram that ruled out PE and showed no evidence of cardiac enlargement or coronary artery calcification.  She also had lower extremity venous Dopplers which were negative for DVT.  PMHx:  Past Medical History:  Diagnosis Date  Allergy 1970   Demerol, FLu vaccine, NSAIDS, Codiene   Anxiety 2002   Ongoing   Asthma    Cataract    Small ones found by opthamolgist   Cerebral aneurysm    being watched by Dr. Myrtis every 6 months   CHF (congestive heart failure) (HCC)    Resolved   Complication of anesthesia HYPOTENSION INTRAOP W/ HYSTERECTOMY IN 1987--  DID OK W/ TOTAL KNEE IN 2008   COPD (chronic obstructive pulmonary disease) (HCC) 1970   Bronchitits    Depression 1970   Diabetes mellitus    DJD (degenerative joint disease)    Dyslipidemia    GERD (gastroesophageal reflux disease)    H/O hiatal hernia    History of CHF (congestive heart failure) 2010-  RESOLVED   History of peptic ulcer YRS AGO   Hypertension    Meniere's disease    Mitral valve prolapse occasional palpitations w/ chest discomfort   Neuromuscular disorder (HCC) 2002   Peripheral neuropathy   OA (osteoarthritis)    Rotator cuff tear, left    Stroke (HCC)    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    Past Surgical History:  Procedure Laterality Date   BUNIONECTOMY  02/08/2005   LEFT FOOT   FRACTURE SURGERY  07/30/2019   (R) Reverse Shoulder Replacement   IR 3D INDEPENDENT WKST  01/06/2021   IR ANGIO INTRA EXTRACRAN SEL COM CAROTID INNOMINATE BILAT MOD SED  03/22/2019   IR ANGIO INTRA EXTRACRAN SEL COM CAROTID INNOMINATE BILAT MOD SED  01/06/2021   IR ANGIO VERTEBRAL SEL SUBCLAVIAN INNOMINATE UNI L MOD SED  03/22/2019   IR ANGIO VERTEBRAL SEL VERTEBRAL UNI R MOD SED  03/22/2019   IR ANGIO VERTEBRAL SEL VERTEBRAL UNI R MOD SED  01/06/2021   IR US  GUIDE VASC ACCESS RIGHT  03/22/2019   IR US  GUIDE VASC ACCESS RIGHT  01/06/2021   JOINT REPLACEMENT  2008, 2011,2021   Total knee replacement, '08 (R), '11 (L)   LEFT SHOULDER SURGERY  02/09/1995   ROTATOR CUFF REPAIR   REVERSE SHOULDER ARTHROPLASTY Right 07/30/2020   Procedure: REVERSE SHOULDER ARTHROPLASTY;  Surgeon: Cristy Bonner DASEN, MD;  Location: [facility];  Service: Orthopedics;  Laterality: Right;   SHOULDER ARTHROSCOPY  09/20/2011   Procedure: ARTHROSCOPY SHOULDER;  Surgeon: Reyes JAYSON Billing, MD;  Location: [facility];  Service: Orthopedics;  Laterality: Left;  SAD AND MINI OPEN ROTATOR CUFF REPAIR     TOTAL KNEE ARTHROPLASTY  09-12-2006  [facility]   RIGHT KNEE   TOTAL KNEE ARTHROPLASTY Left 11/30/2017   Procedure: LEFT TOTAL KNEE REPLACEMENT;  Surgeon: Barbarann Oneil JAYSON, MD;  Location: [facility];  Service:  Orthopedics;  Laterality: Left;   TUBAL LIGATION  02/08/1974   TYMPANOPLASTY  1990;   1980   VAGINAL HYSTERECTOMY  02/08/1985    FAMHx:  Family History  Problem Relation Age of Onset   Heart disease Mother        in her 32s, heart attack   Diabetes Mother    Kidney disease Mother        dialysis   Thyroid  disease Mother    Aneurysm Mother    Ovarian cancer Mother    Breast cancer Mother    Arthritis Mother    Cancer Mother    Depression Mother    Stroke Mother    Thyroid  disease Sister    Heart disease Sister    Diabetes Sister    Hyperlipidemia Sister  Hypertension Sister    Lymphoma Father    Diabetes Father    Hypertension Father    Congestive Heart Failure Father    Arthritis Father    Cancer Father    Vision loss Father    Thyroid  disease Brother    Hyperlipidemia Brother    Hypertension Brother    Obesity Brother    Thyroid  disease Paternal Grandmother    Arthritis Paternal Grandmother    Heart attack Sister        at age 67   Stroke Sister    Hypertension Sister    Arthritis Sister    Asthma Sister    Cancer Sister    Diabetes Sister    Hearing loss Sister    Hyperlipidemia Sister    Obesity Sister    Vision loss Sister    Breast cancer Maternal Aunt    Breast cancer Maternal Grandmother    Anxiety disorder Maternal Grandmother    Breast cancer Maternal Aunt    Breast cancer Maternal Aunt    COPD Paternal Grandfather    Early death Paternal Grandfather    Other Brother        Set designer accident   Drug abuse Brother    Bipolar disorder Son    Other Son        GSW   Early death Son    ADD / ADHD Daughter    Diabetes Daughter    Anxiety disorder Daughter    Diabetes Daughter    Obesity Daughter    Early death Brother     SOCHx:   reports that she has never smoked. She has never been exposed to tobacco smoke. She has never used smokeless tobacco. She reports that she does not drink alcohol and does not use drugs.  ALLERGIES:  Allergies   Allergen Reactions   Influenza Vaccines Anaphylaxis   Atorvastatin  Other (See Comments)    Joint pain / myalgias   Demerol Other (See Comments)    SEIZURE   Nsaids Other (See Comments)    NAPROXEN, IBUPROFEN, SKELAXIN---  CAUSES PALPITATIONS   Pravastatin Other (See Comments)    Myalgias    Prednisone  Other (See Comments)    Caused her glucose to go up to 500 and went to ED   Rosuvastatin  Other (See Comments)    Joint stiffness and muscle pain   Roxicodone  [Oxycodone ] Itching and Other (See Comments)    Hallucinations and itching   Tramadol  Other (See Comments)    Contraindicated with Victoza     Covid-19 (Mrna) Vaccine     Patient had anaphylactic reaction to flu vaccine   Nortriptyline  Other (See Comments)    Severe depression   Codeine Itching   Mango Flavor [Flavoring Agent (Non-Screening)] Itching    ROS: Pertinent items noted in HPI and remainder of comprehensive ROS otherwise negative.  HOME MEDS: Current Outpatient Medications on File Prior to Visit  Medication Sig Dispense Refill   albuterol  (VENTOLIN  HFA) 108 (90 Base) MCG/ACT inhaler Inhale 2 puffs into the lungs every 6 (six) hours as needed for wheezing or shortness of breath. 18 g 2   Blood Glucose Monitoring Suppl (ONE TOUCH ULTRA 2) w/Device KIT Use to check glucose up to twice daily. E11.9 dx code 1 kit 0   cetirizine  (ZYRTEC ) 10 MG tablet Take 1 tablet (10 mg total) by mouth daily. 90 tablet 3   Cholecalciferol (VITAMIN D ) 50 MCG (2000 UT) tablet Take 2,000 Units by mouth daily.     clopidogrel  (PLAVIX )  75 MG tablet Take 1 tablet (75 mg total) by mouth daily. 90 tablet 3   empagliflozin  (JARDIANCE ) 10 MG TABS tablet Take 1 tablet (10 mg total) by mouth daily. (Patient taking differently: Take 5 mg by mouth daily.) 90 tablet 3   fluticasone  (FLONASE ) 50 MCG/ACT nasal spray Place 2 sprays into both nostrils daily. 16 g 6   Fluticasone  Furoate (ARNUITY ELLIPTA ) 200 MCG/ACT AEPB Inhale 1 puff (200 mcg) into the  lungs daily. 90 each 3   furosemide  (LASIX ) 20 MG tablet Take 1 tablet (20 mg total) by mouth every morning. 90 tablet 0   gabapentin  (NEURONTIN ) 300 MG capsule Take 2 capsules (600 mg total) by mouth 2 (two) times daily. 360 capsule 3   glucose blood (ONETOUCH VERIO) test strip Check blood sugars once daily 100 each 12   ipratropium (ATROVENT ) 0.03 % nasal spray Place 1 spray into both nostrils in the morning. 90 mL 0   Lancets (ONETOUCH DELICA PLUS LANCET33G) MISC Use to check blood sugars once daily. 100 each 12   linagliptin  (TRADJENTA ) 5 MG TABS tablet Take 1 tablet (5 mg total) by mouth daily. (Patient taking differently: Take 2.5 mg by mouth daily.) 90 tablet 3   lisinopril  (ZESTRIL ) 20 MG tablet Take 1 tablet (20 mg total) by mouth every morning. 90 tablet 0   metFORMIN  (GLUCOPHAGE -XR) 750 MG 24 hr tablet Take 1 tablet (750 mg total) by mouth 2 (two) times daily. 180 tablet 0   metoprolol  tartrate (LOPRESSOR ) 25 MG tablet Take 0.5 tablets (12.5 mg total) by mouth 2 (two) times daily. 90 tablet 0   Multiple Vitamins-Minerals (ADULT GUMMY PO) Take 2 capsules by mouth daily.     Spacer/Aero-Holding Raguel FRENCH Use with Flovent  and albuterol  inhalers 1 each 0   Ubrogepant  (UBRELVY ) 50 MG TABS Take 1 tablet (50 mg total) by mouth at onset of migraine. May repeat in 2 hours, if needed. Max dose: 2 tablets/day. This is a 30 day prescription. 12 tablet 11   No current facility-administered medications on file prior to visit.    LABS/IMAGING: No results found for this or any previous visit (from the past 48 hours).  No results found.  LIPID PANEL:    Component Value Date/Time   CHOL 143 07/20/2021 0938   TRIG 71.0 07/20/2021 0938   HDL 68.60 07/20/2021 0938   CHOLHDL 2 07/20/2021 0938   VLDL 14.2 07/20/2021 0938   LDLCALC 60 07/20/2021 0938     WEIGHTS: Wt Readings from Last 3 Encounters:  10/17/23 198 lb (89.8 kg)  10/12/23 195 lb 3.2 oz (88.5 kg)  10/06/23 194 lb (88 kg)     VITALS: BP 132/84   Pulse 84   Ht 5' 1 (1.549 m)   Wt 198 lb (89.8 kg)   SpO2 97%   BMI 37.41 kg/m   EXAM: General appearance: alert and no distress Neck: no carotid bruit and no JVD Lungs: clear to auscultation bilaterally Heart: regular rate and rhythm Abdomen: soft, non-tender; bowel sounds normal; no masses,  no organomegaly Extremities: extremities normal, atraumatic, no cyanosis or edema Pulses: 2+ and symmetric Skin: Skin color, texture, turgor normal. No rashes or lesions Neurologic: GroPsych: Pleasanty normal Psych: Pleasant  EKG: N/A  ASSESSMENT: DOE and lower extremity edema Hypertension Dyslipidemia History of recurrent TIA Cerebral aneurysm  PLAN: 1.   Sherry [name] recently had progressive dyspnea on exertion and lower extremity edema was seen in the emergency department for this.  Her workup was largely  unremarkable including negative troponins, low BNP, unremarkable chest CT without any evidence for PE or pericardial effusion, negative lower extremity Dopplers and normal EKG.  She reports improvement in her edema with Lasix  40 mg daily.  I advised her to increase her Lasix  back to 40 mg daily and we will provide a prescription for that.  Will also obtain an echocardiogram given her shortness of breath symptoms since they are persistent.  Low suspicion for coronary artery disease given lack of coronary calcium  on her CT.  It also appears on exam that she may have some lymphedema.  She noted that her father also had lymphedema therefore some of her swelling may be genetic.  She is not on any medications that are typically associated with swelling.  Plan to follow-up with me after testing.  Sherry KYM Maxcy, MD, [facility], FNLA, FACP  [facility] [address] [facility]  Medical Director of the [facility]  Cardiovascular Risk Reduction Clinic Diplomate of the American Board of Clinical Lipidology Attending Cardiologist  Direct Dial: [phone]  Fax:  [phone]  Website:  [email or web address]    Sherry [name] 10/17/2023, 2:37 PM

## 2023-10-17 NOTE — Patient Instructions (Signed)
  Medication Instructions:  INCREASE furosemide  to 40mg  daily  *If you need a refill on your cardiac medications before your next appointment, please call your pharmacy*   Testing/Procedures: Your physician has requested that you have an echocardiogram. Echocardiography is a painless test that uses sound waves to create images of your heart. It provides your doctor with information about the size and shape of your heart and how well your heart's chambers and valves are working. This procedure takes approximately one hour. There are no restrictions for this procedure. Please do NOT wear cologne, perfume, aftershave, or lotions (deodorant is allowed). Please arrive 15 minutes prior to your appointment time.  Please note: We ask at that you not bring children with you during ultrasound (echo/ vascular) testing. Due to room size and safety concerns, children are not allowed in the ultrasound rooms during exams. Our front office staff cannot provide observation of children in our lobby area while testing is being conducted. An adult accompanying a patient to their appointment will only be allowed in the ultrasound room at the discretion of the ultrasound technician under special circumstances. We apologize for any inconvenience.   Follow-Up: At [facility], you and your health needs are our priority.  As part of our continuing mission to provide you with exceptional heart care, our providers are all part of one team.  This team includes your primary Cardiologist (physician) and Advanced Practice Providers or APPs (Physician Assistants and Nurse Practitioners) who all work together to provide you with the care you need, when you need it.  Your next appointment:    2 months with Dr. Mona or PA/NP  We recommend signing up for the patient portal called MyChart.  Sign up information is provided on this After Visit Summary.  MyChart is used to connect with patients for Virtual Visits  (Telemedicine).  Patients are able to view lab/test results, encounter notes, upcoming appointments, etc.  Non-urgent messages can be sent to your provider as well.   To learn more about what you can do with MyChart, go to [email or web address].

## 2023-10-18 ENCOUNTER — Ambulatory Visit: Payer: Medicare HMO | Admitting: Adult Health

## 2023-10-31 ENCOUNTER — Other Ambulatory Visit (HOSPITAL_BASED_OUTPATIENT_CLINIC_OR_DEPARTMENT_OTHER): Payer: Self-pay

## 2023-10-31 ENCOUNTER — Other Ambulatory Visit: Payer: Self-pay | Admitting: Family Medicine

## 2023-10-31 ENCOUNTER — Telehealth: Payer: Self-pay | Admitting: Pharmacist

## 2023-10-31 MED ORDER — GLUCOSE BLOOD VI STRP
1.0000 | ORAL_STRIP | Freq: Every day | 12 refills | Status: AC
  Filled 2023-10-31: qty 100, 100d supply, fill #0
  Filled 2024-02-06: qty 100, 100d supply, fill #1

## 2023-10-31 NOTE — Telephone Encounter (Signed)
 Pharmacy Patient Advocate Encounter   Received notification from Patient Pharmacy that prior authorization for Ubrelvy  50MG  tablets is required/requested.   Insurance verification completed.   The patient is insured through [facility] .   Per test claim: PA required; PA submitted to above mentioned insurance via Latent Key/confirmation #/EOC [facility] Status is pending

## 2023-10-31 NOTE — Telephone Encounter (Signed)
 Pharmacy Patient Advocate Encounter  Received notification from [facility] that Prior Authorization for UBRELVY  50 MG PO TABS has been APPROVED from 10/31/2023 to 02/08/2024   PA #/Case ID/Reference #: [id number]

## 2023-11-01 ENCOUNTER — Other Ambulatory Visit (HOSPITAL_BASED_OUTPATIENT_CLINIC_OR_DEPARTMENT_OTHER): Payer: Self-pay

## 2023-11-01 MED ORDER — ACCU-CHEK GUIDE W/DEVICE KIT
1.0000 | PACK | Freq: Every day | 0 refills | Status: AC
  Filled 2023-11-01 (×3): qty 1, 30d supply, fill #0

## 2023-11-01 MED ORDER — ACCU-CHEK SOFTCLIX LANCETS MISC
1.0000 | Freq: Every day | 12 refills | Status: AC
  Filled 2023-11-01: qty 100, 100d supply, fill #0
  Filled 2024-02-06: qty 100, 100d supply, fill #1

## 2023-11-14 ENCOUNTER — Other Ambulatory Visit: Payer: Self-pay

## 2023-11-14 ENCOUNTER — Other Ambulatory Visit (HOSPITAL_BASED_OUTPATIENT_CLINIC_OR_DEPARTMENT_OTHER): Payer: Self-pay

## 2023-11-14 ENCOUNTER — Other Ambulatory Visit: Payer: Self-pay | Admitting: Family Medicine

## 2023-11-14 DIAGNOSIS — I10 Essential (primary) hypertension: Secondary | ICD-10-CM

## 2023-11-14 MED ORDER — CETIRIZINE HCL 10 MG PO TABS
10.0000 mg | ORAL_TABLET | Freq: Every day | ORAL | 3 refills | Status: AC
  Filled 2023-11-14: qty 90, 90d supply, fill #0
  Filled 2024-02-22 – 2024-03-05 (×2): qty 90, 90d supply, fill #1

## 2023-11-14 MED ORDER — LISINOPRIL 20 MG PO TABS
20.0000 mg | ORAL_TABLET | Freq: Every morning | ORAL | 0 refills | Status: DC
  Filled 2023-11-14: qty 90, 90d supply, fill #0

## 2023-11-14 MED ORDER — CLOPIDOGREL BISULFATE 75 MG PO TABS
75.0000 mg | ORAL_TABLET | Freq: Every day | ORAL | 3 refills | Status: AC
  Filled 2023-11-14 – 2024-02-01 (×2): qty 90, 90d supply, fill #0

## 2023-11-15 ENCOUNTER — Other Ambulatory Visit (HOSPITAL_BASED_OUTPATIENT_CLINIC_OR_DEPARTMENT_OTHER): Payer: Self-pay

## 2023-11-21 ENCOUNTER — Other Ambulatory Visit: Payer: Self-pay

## 2023-11-23 ENCOUNTER — Encounter (HOSPITAL_COMMUNITY): Payer: Self-pay | Admitting: Internal Medicine

## 2023-11-23 ENCOUNTER — Ambulatory Visit (HOSPITAL_COMMUNITY): Attending: Cardiovascular Disease

## 2023-11-23 ENCOUNTER — Encounter (HOSPITAL_COMMUNITY): Payer: Self-pay

## 2023-12-04 ENCOUNTER — Other Ambulatory Visit: Payer: Self-pay | Admitting: Family Medicine

## 2023-12-04 ENCOUNTER — Other Ambulatory Visit (HOSPITAL_COMMUNITY): Payer: Self-pay

## 2023-12-04 ENCOUNTER — Other Ambulatory Visit: Payer: Self-pay | Admitting: Neurology

## 2023-12-04 DIAGNOSIS — I10 Essential (primary) hypertension: Secondary | ICD-10-CM

## 2023-12-05 ENCOUNTER — Other Ambulatory Visit: Payer: Self-pay

## 2023-12-05 ENCOUNTER — Other Ambulatory Visit (HOSPITAL_BASED_OUTPATIENT_CLINIC_OR_DEPARTMENT_OTHER): Payer: Self-pay

## 2023-12-05 ENCOUNTER — Other Ambulatory Visit (HOSPITAL_COMMUNITY): Payer: Self-pay

## 2023-12-05 MED ORDER — GABAPENTIN 300 MG PO CAPS
600.0000 mg | ORAL_CAPSULE | Freq: Two times a day (BID) | ORAL | 3 refills | Status: AC
  Filled 2023-12-05: qty 360, 90d supply, fill #0
  Filled 2024-03-05: qty 360, 90d supply, fill #1

## 2023-12-05 MED ORDER — UBRELVY 50 MG PO TABS
50.0000 mg | ORAL_TABLET | ORAL | 11 refills | Status: AC | PRN
  Filled 2023-12-05: qty 12, 30d supply, fill #0
  Filled 2024-01-04: qty 12, 30d supply, fill #1
  Filled 2024-02-01: qty 12, 30d supply, fill #2

## 2023-12-05 MED ORDER — METFORMIN HCL ER 750 MG PO TB24
750.0000 mg | ORAL_TABLET | Freq: Two times a day (BID) | ORAL | 0 refills | Status: DC
  Filled 2023-12-05: qty 180, 90d supply, fill #0

## 2023-12-05 MED ORDER — METOPROLOL TARTRATE 25 MG PO TABS
12.5000 mg | ORAL_TABLET | Freq: Two times a day (BID) | ORAL | 0 refills | Status: DC
  Filled 2023-12-05: qty 90, 90d supply, fill #0

## 2023-12-05 MED FILL — Linagliptin Tab 5 MG: ORAL | 90 days supply | Qty: 90 | Fill #0 | Status: CN

## 2023-12-05 MED FILL — Empagliflozin Tab 10 MG: ORAL | 90 days supply | Qty: 90 | Fill #0 | Status: CN

## 2023-12-06 ENCOUNTER — Other Ambulatory Visit: Payer: Self-pay

## 2023-12-07 ENCOUNTER — Other Ambulatory Visit (HOSPITAL_COMMUNITY): Payer: Self-pay

## 2023-12-13 ENCOUNTER — Encounter: Payer: Self-pay | Admitting: Internal Medicine

## 2023-12-13 ENCOUNTER — Ambulatory Visit: Admitting: Internal Medicine

## 2023-12-13 ENCOUNTER — Ambulatory Visit
Admission: RE | Admit: 2023-12-13 | Discharge: 2023-12-13 | Disposition: A | Discharge status: Home / Self Care | Source: Ambulatory Visit | Attending: Cardiology | Admitting: Cardiology

## 2023-12-13 VITALS — BP 152/86 | HR 64 | Ht 61.0 in | Wt 196.2 lb

## 2023-12-13 DIAGNOSIS — R0602 Shortness of breath: Secondary | ICD-10-CM | POA: Diagnosis not present

## 2023-12-13 DIAGNOSIS — R079 Chest pain, unspecified: Secondary | ICD-10-CM | POA: Insufficient documentation

## 2023-12-13 DIAGNOSIS — R600 Localized edema: Secondary | ICD-10-CM | POA: Diagnosis present

## 2023-12-13 DIAGNOSIS — I10 Essential (primary) hypertension: Secondary | ICD-10-CM | POA: Diagnosis present

## 2023-12-13 LAB — ECHOCARDIOGRAM COMPLETE
Area-P 1/2: 4.05 cm2
S' Lateral: 2.2 cm

## 2023-12-13 MED ORDER — FUROSEMIDE 40 MG PO TABS
20.0000 mg | ORAL_TABLET | Freq: Every day | ORAL | Status: AC
Start: 2023-12-13 — End: ?

## 2023-12-13 NOTE — Patient Instructions (Signed)
 Medication Instructions:  NO CHANGES  *If you need a refill on your cardiac medications before your next appointment, please call your pharmacy*  Testing/Procedures:  We will be in touch with your echo results  Follow-Up: At [facility], you and your health needs are our priority.  As part of our continuing mission to provide you with exceptional heart care, our providers are all part of one team.  This team includes your primary Cardiologist (physician) and Advanced Practice Providers or APPs (Physician Assistants and Nurse Practitioners) who all work together to provide you with the care you need, when you need it.  Your next appointment:    3 months with Dr. Mona or PA/NP  We recommend signing up for the patient portal called MyChart.  Sign up information is provided on this After Visit Summary.  MyChart is used to connect with patients for Virtual Visits (Telemedicine).  Patients are able to view lab/test results, encounter notes, upcoming appointments, etc.  Non-urgent messages can be sent to your provider as well.   To learn more about what you can do with MyChart, go to [email or web address].

## 2023-12-13 NOTE — Progress Notes (Signed)
 OFFICE FOLLOW-UP NOTE  Chief Complaint:  Follow-up  Primary Care Physician: Watt Harlene BROCKS, MD  HPI:  Sherry [name] is a 73 y.o. female with a past medial history significant for cerebral aneurysm (not coiled), recurrent TIA, type 2 diabetes, dyslipidemia, mitral valve prolapse, reported congestive heart failure with normal LV EF on echo, hypertension, GERD and hiatal hernia, and degenerative joint disease with prior total knee replacement, who presents with left knee pain desiring left total knee replacement by Dr. Addie. She previously saw Dr. Delford in 2016, but does not recall the visit. Later she mentioned that she wanted to see me because he did not have any appointments. At the time she presented for intermittent chest discomfort which is described as sharp and fleeting. This was thought to be atypical. She underwent an echocardiogram in stress Myoview  in 2016 which was negative for ischemia. She was also consult a by cardiac EP for an implanted loop recorder but declined at the time. She had a repeat echo in March 2017 which showed EF 60-65%, normal LV wall thickness, no regional wall motion abnormalities. No diagnostic mitral valve prolapse was noted. Interestingly, she was told the past that she may need mitral valve replacement however there is no evidence of significant dysfunction of the mitral valve. She denies any chest pain or worsening shortness of breath with exertion and can do more than 4 METS of activity.  11/16/2017  Sherry [name] returns today for preoperative risk assessment.  I saw her for evaluation for knee replacement by Dr. Addie in 2018 however she did not undergo that surgery.  Her knee is now significantly worsened and she is scheduled to have knee replacement with Dr. Barbarann in October.  After I saw her last year than she was admitted to the hospital for possible CHF however felt to have asthma exacerbation.  She underwent nuclear stress testing and was seen  in follow-up by Katheryn Jude, NP.  Her nuclear stress test was low risk and since then she is done well.  She denies any further chest pain or worsening shortness of breath.  Her activity level significantly decreased due to her left knee which she has trouble bending.  She lost her husband this past year as well which is put a lot of stress on her.  Despite that she denies any anginal symptoms.  10/17/2023  Sherry [name] is here to reestablish care.  I last saw her in 2019 and she is considered a new patient.  She was referred by Dr. Watt after recent ER visit for lower extremity swelling and shortness of breath.  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  She notes that recently she has had some worsening lower extremity swelling and shortness of breath, particularly walking up stairs.  She has had this in the past and underwent stress testing in 2019 which was negative.  She was on 20 mg daily Lasix  however was advised recently to increase her Lasix  to 40 mg daily.  She did this for 5 days and noted improvement in her swelling but then returned back to 20 mg daily as instructed.  Workup in the emergency department for shortness of breath was unrevealing including a negative proBNP, negative troponins, CT angiogram that ruled out PE and showed no evidence of cardiac enlargement or coronary artery calcification.  She also had lower extremity venous Dopplers which were negative for DVT.  12/13/2023  Sherry [name] returns today for follow-up.  She recently had some  worsening shortness of breath and I ordered an echo which was performed this morning.  This demonstrated LVEF 60 to 65% with trivial mitral valve regurgitation.  RVSP was top normal at 35.7 mmHg.  Has not cleared at this fully explains her shortness of breath.  Blood pressure was a little elevated at 152/86.  PMHx:  Past Medical History:  Diagnosis Date   Allergy 1970   Demerol, FLu vaccine, NSAIDS, Codiene   Anxiety 2002   Ongoing    Asthma    Cataract    Small ones found by opthamolgist   Cerebral aneurysm    being watched by Dr. Myrtis every 6 months   CHF (congestive heart failure) (HCC)    Resolved   Complication of anesthesia HYPOTENSION INTRAOP W/ HYSTERECTOMY IN 1987--  DID OK W/ TOTAL KNEE IN 2008   COPD (chronic obstructive pulmonary disease) (HCC) 1970   Bronchitits   Depression 1970   Diabetes mellitus    DJD (degenerative joint disease)    Dyslipidemia    GERD (gastroesophageal reflux disease)    H/O hiatal hernia    History of CHF (congestive heart failure) 2010-  RESOLVED   History of peptic ulcer YRS AGO   Hypertension    Meniere's disease    Mitral valve prolapse occasional palpitations w/ chest discomfort   Neuromuscular disorder (HCC) 2002   Peripheral neuropathy   OA (osteoarthritis)    Rotator cuff tear, left    Stroke (HCC)    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    Past Surgical History:  Procedure Laterality Date   BUNIONECTOMY  02/08/2005   LEFT FOOT   FRACTURE SURGERY  07/30/2019   (R) Reverse Shoulder Replacement   IR 3D INDEPENDENT WKST  01/06/2021   IR ANGIO INTRA EXTRACRAN SEL COM CAROTID INNOMINATE BILAT MOD SED  03/22/2019   IR ANGIO INTRA EXTRACRAN SEL COM CAROTID INNOMINATE BILAT MOD SED  01/06/2021   IR ANGIO VERTEBRAL SEL SUBCLAVIAN INNOMINATE UNI L MOD SED  03/22/2019   IR ANGIO VERTEBRAL SEL VERTEBRAL UNI R MOD SED  03/22/2019   IR ANGIO VERTEBRAL SEL VERTEBRAL UNI R MOD SED  01/06/2021   IR US  GUIDE VASC ACCESS RIGHT  03/22/2019   IR US  GUIDE VASC ACCESS RIGHT  01/06/2021   JOINT REPLACEMENT  2008, 2011,2021   Total knee replacement, '08 (R), '11 (L)   LEFT SHOULDER SURGERY  02/09/1995   ROTATOR CUFF REPAIR   REVERSE SHOULDER ARTHROPLASTY Right 07/30/2020   Procedure: REVERSE SHOULDER ARTHROPLASTY;  Surgeon: Cristy Bonner DASEN, MD;  Location: [facility];  Service: Orthopedics;  Laterality: Right;   SHOULDER ARTHROSCOPY  09/20/2011   Procedure: ARTHROSCOPY  SHOULDER;  Surgeon: Reyes JAYSON Billing, MD;  Location: [facility];  Service: Orthopedics;  Laterality: Left;  SAD AND MINI OPEN ROTATOR CUFF REPAIR     TOTAL KNEE ARTHROPLASTY  09-12-2006  [facility]   RIGHT KNEE   TOTAL KNEE ARTHROPLASTY Left 11/30/2017   Procedure: LEFT TOTAL KNEE REPLACEMENT;  Surgeon: Barbarann Oneil JAYSON, MD;  Location: [facility];  Service: Orthopedics;  Laterality: Left;   TUBAL LIGATION  02/08/1974   TYMPANOPLASTY  1990;   1980   VAGINAL HYSTERECTOMY  02/08/1985    FAMHx:  Family History  Problem Relation Age of Onset   Heart disease Mother        in her 19s, heart attack   Diabetes Mother    Kidney disease Mother        dialysis  Thyroid  disease Mother    Aneurysm Mother    Ovarian cancer Mother    Breast cancer Mother    Arthritis Mother    Cancer Mother    Depression Mother    Stroke Mother    Thyroid  disease Sister    Heart disease Sister    Diabetes Sister    Hyperlipidemia Sister    Hypertension Sister    Lymphoma Father    Diabetes Father    Hypertension Father    Congestive Heart Failure Father    Arthritis Father    Cancer Father    Vision loss Father    Thyroid  disease Brother    Hyperlipidemia Brother    Hypertension Brother    Obesity Brother    Thyroid  disease Paternal Grandmother    Arthritis Paternal Grandmother    Heart attack Sister        at age 82   Stroke Sister    Hypertension Sister    Arthritis Sister    Asthma Sister    Cancer Sister    Diabetes Sister    Hearing loss Sister    Hyperlipidemia Sister    Obesity Sister    Vision loss Sister    Breast cancer Maternal Aunt    Breast cancer Maternal Grandmother    Anxiety disorder Maternal Grandmother    Breast cancer Maternal Aunt    Breast cancer Maternal Aunt    COPD Paternal Grandfather    Early death Paternal Grandfather    Other Brother        Set Designer accident   Drug abuse Brother    Bipolar disorder Son    Other Son        GSW   Early death Son    ADD /  ADHD Daughter    Diabetes Daughter    Anxiety disorder Daughter    Diabetes Daughter    Obesity Daughter    Early death Brother     SOCHx:   reports that she has never smoked. She has never been exposed to tobacco smoke. She has never used smokeless tobacco. She reports that she does not drink alcohol and does not use drugs.  ALLERGIES:  Allergies  Allergen Reactions   Influenza Vaccines Anaphylaxis   Atorvastatin  Other (See Comments)    Joint pain / myalgias   Demerol Other (See Comments)    SEIZURE   Nsaids Other (See Comments)    NAPROXEN, IBUPROFEN, SKELAXIN---  CAUSES PALPITATIONS   Pravastatin Other (See Comments)    Myalgias    Prednisone  Other (See Comments)    Caused her glucose to go up to 500 and went to ED   Rosuvastatin  Other (See Comments)    Joint stiffness and muscle pain   Roxicodone  [Oxycodone ] Itching and Other (See Comments)    Hallucinations and itching   Tramadol  Other (See Comments)    Contraindicated with Victoza     Covid-19 (Mrna) Vaccine     Patient had anaphylactic reaction to flu vaccine   Nortriptyline  Other (See Comments)    Severe depression   Codeine Itching   Mango Flavor [Flavoring Agent (Non-Screening)] Itching    ROS: Pertinent items noted in HPI and remainder of comprehensive ROS otherwise negative.  HOME MEDS: Current Outpatient Medications on File Prior to Visit  Medication Sig Dispense Refill   Accu-Chek Softclix Lancets lancets Use to check blood sugars once daily. 100 each 12   albuterol  (VENTOLIN  HFA) 108 (90 Base) MCG/ACT inhaler Inhale 2 puffs into the lungs every  6 (six) hours as needed for wheezing or shortness of breath. 18 g 2   Blood Glucose Monitoring Suppl (ACCU-CHEK GUIDE) w/Device KIT Use to monitor glucose daily. 1 kit 0   Blood Glucose Monitoring Suppl (ONE TOUCH ULTRA 2) w/Device KIT Use to check glucose up to twice daily. E11.9 dx code 1 kit 0   cetirizine  (ZYRTEC ) 10 MG tablet Take 1 tablet (10 mg total) by  mouth daily. 90 tablet 3   Cholecalciferol (VITAMIN D ) 50 MCG (2000 UT) tablet Take 2,000 Units by mouth daily.     clopidogrel  (PLAVIX ) 75 MG tablet Take 1 tablet (75 mg total) by mouth daily. 90 tablet 3   empagliflozin  (JARDIANCE ) 10 MG TABS tablet Take 1 tablet (10 mg total) by mouth daily. 90 tablet 3   fluticasone  (FLONASE ) 50 MCG/ACT nasal spray Place 2 sprays into both nostrils daily. 16 g 6   Fluticasone  Furoate (ARNUITY ELLIPTA ) 200 MCG/ACT AEPB Inhale 1 puff (200 mcg) into the lungs daily. 90 each 3   furosemide  (LASIX ) 40 MG tablet Take 1 tablet (40 mg total) by mouth daily. 90 tablet 1   gabapentin  (NEURONTIN ) 300 MG capsule Take 2 capsules (600 mg total) by mouth 2 (two) times daily. 360 capsule 3   glucose blood test strip Use to check blood sugars once daily. 100 each 12   ipratropium (ATROVENT ) 0.03 % nasal spray Place 1 spray into both nostrils in the morning. 90 mL 0   Lancets (ONETOUCH DELICA PLUS LANCET33G) MISC Use to check blood sugars once daily. 100 each 12   linagliptin  (TRADJENTA ) 5 MG TABS tablet Take 1 tablet (5 mg total) by mouth daily. 90 tablet 3   lisinopril  (ZESTRIL ) 20 MG tablet Take 1 tablet (20 mg total) by mouth every morning. 90 tablet 0   metFORMIN  (GLUCOPHAGE -XR) 750 MG 24 hr tablet Take 1 tablet (750 mg total) by mouth 2 (two) times daily. 180 tablet 0   metoprolol  tartrate (LOPRESSOR ) 25 MG tablet Take 0.5 tablets (12.5 mg total) by mouth 2 (two) times daily. 90 tablet 0   Multiple Vitamins-Minerals (ADULT GUMMY PO) Take 2 capsules by mouth daily.     Spacer/Aero-Holding Raguel FRENCH Use with Flovent  and albuterol  inhalers 1 each 0   Ubrogepant  (UBRELVY ) 50 MG TABS Take 1 tablet (50 mg total) by mouth at onset of migraine. May repeat in 2 hours, if needed. Max dose: 2 tablets/day. This is a 30 day prescription. 12 tablet 11   No current facility-administered medications on file prior to visit.    LABS/IMAGING: No results found for this or any  previous visit (from the past 48 hours).  No results found.  LIPID PANEL:    Component Value Date/Time   CHOL 143 07/20/2021 0938   TRIG 71.0 07/20/2021 0938   HDL 68.60 07/20/2021 0938   CHOLHDL 2 07/20/2021 0938   VLDL 14.2 07/20/2021 0938   LDLCALC 60 07/20/2021 0938     WEIGHTS: Wt Readings from Last 3 Encounters:  12/13/23 196 lb 3.2 oz (89 kg)  10/17/23 198 lb (89.8 kg)  10/12/23 195 lb 3.2 oz (88.5 kg)    VITALS: BP (!) 152/86 (BP Location: Right Arm, Patient Position: Sitting, Cuff Size: Large)   Pulse 64   Ht 5' 1 (1.549 m)   Wt 196 lb 3.2 oz (89 kg)   SpO2 94%   BMI 37.07 kg/m   EXAM: General appearance: alert and no distress Neck: no carotid bruit and no JVD Lungs: clear  to auscultation bilaterally Heart: regular rate and rhythm Abdomen: soft, non-tender; bowel sounds normal; no masses,  no organomegaly Extremities: extremities normal, atraumatic, no cyanosis or edema Pulses: 2+ and symmetric Skin: Skin color, texture, turgor normal. No rashes or lesions Neurologic: GroPsych: Pleasanty normal Psych: Pleasant  EKG: Deferred  ASSESSMENT: Burning chest pain DOE and lower extremity edema Hypertension Dyslipidemia History of recurrent TIA Cerebral aneurysm  PLAN: 1.   Mrs. Wardrop had a pretty reassuring echo although is has having some shortness of breath.  Some of this may be deconditioning.  She wants to continue to monitor this symptoms.  She has struggled with headaches and recurrent TIA symptoms.  She was found to have significant intracranial atherosclerosis however is on Plavix  therapy being managed by neurology.  She could have coronary artery disease.  He has described intermittent burning chest discomfort.  I advised her to keep an eye on it and if her symptoms worsen or happen more frequently we may need to consider repeat stress testing.  Follow-up with APP or myself in 3 months.  Vinie KYM Maxcy, MD, [facility], FNLA, FACP  [place]  [facility]  Medical Director of the [facility]  [facility] Diplomate of the American Board of Clinical Lipidology Attending Cardiologist  Direct Dial: [phone]  Fax: [phone]  Website:  [email or web address] Vinie BROCKS Latina Frank 12/13/2023, 10:04 AM

## 2024-01-04 ENCOUNTER — Telehealth: Payer: Self-pay | Admitting: Family Medicine

## 2024-01-04 MED FILL — Empagliflozin Tab 10 MG: ORAL | 90 days supply | Qty: 90 | Fill #0 | Status: AC

## 2024-01-04 NOTE — Telephone Encounter (Signed)
 Copied from CRM #[id number]. Topic: Medicare AWV >> Jan 04, 2024 11:10 AM Nathanel DEL wrote: Called LVM 01/04/2024 to sched AWV. Please schedule in office or virtual visit  Nathanel Paschal; Care Guide Ambulatory Clinical Support [facility] Direct Dial: [phone]

## 2024-01-06 ENCOUNTER — Other Ambulatory Visit: Payer: Self-pay

## 2024-01-06 ENCOUNTER — Other Ambulatory Visit (HOSPITAL_COMMUNITY): Payer: Self-pay

## 2024-02-01 ENCOUNTER — Other Ambulatory Visit (HOSPITAL_BASED_OUTPATIENT_CLINIC_OR_DEPARTMENT_OTHER): Payer: Self-pay

## 2024-02-01 ENCOUNTER — Other Ambulatory Visit (HOSPITAL_COMMUNITY): Payer: Self-pay

## 2024-02-06 ENCOUNTER — Other Ambulatory Visit: Payer: Self-pay | Admitting: Family Medicine

## 2024-02-06 ENCOUNTER — Other Ambulatory Visit (HOSPITAL_COMMUNITY): Payer: Self-pay

## 2024-02-06 DIAGNOSIS — I10 Essential (primary) hypertension: Secondary | ICD-10-CM

## 2024-02-06 MED FILL — Linagliptin Tab 5 MG: ORAL | 90 days supply | Qty: 90 | Fill #0 | Status: AC

## 2024-02-07 ENCOUNTER — Other Ambulatory Visit: Payer: Self-pay

## 2024-02-07 ENCOUNTER — Other Ambulatory Visit (HOSPITAL_COMMUNITY): Payer: Self-pay

## 2024-02-07 MED ORDER — LISINOPRIL 20 MG PO TABS
20.0000 mg | ORAL_TABLET | Freq: Every morning | ORAL | 0 refills | Status: AC
  Filled 2024-02-07: qty 90, 90d supply, fill #0

## 2024-02-23 ENCOUNTER — Other Ambulatory Visit (HOSPITAL_BASED_OUTPATIENT_CLINIC_OR_DEPARTMENT_OTHER): Payer: Self-pay

## 2024-02-23 ENCOUNTER — Other Ambulatory Visit (HOSPITAL_COMMUNITY): Payer: Self-pay

## 2024-02-23 ENCOUNTER — Emergency Department (HOSPITAL_BASED_OUTPATIENT_CLINIC_OR_DEPARTMENT_OTHER)

## 2024-02-23 ENCOUNTER — Emergency Department (HOSPITAL_BASED_OUTPATIENT_CLINIC_OR_DEPARTMENT_OTHER)
Admission: EM | Admit: 2024-02-23 | Discharge: 2024-02-23 | Disposition: A | Discharge status: Home / Self Care | Attending: Emergency Medicine | Admitting: Emergency Medicine

## 2024-02-23 ENCOUNTER — Other Ambulatory Visit: Payer: Self-pay

## 2024-02-23 ENCOUNTER — Encounter (HOSPITAL_BASED_OUTPATIENT_CLINIC_OR_DEPARTMENT_OTHER): Payer: Self-pay

## 2024-02-23 DIAGNOSIS — E119 Type 2 diabetes mellitus without complications: Secondary | ICD-10-CM | POA: Diagnosis not present

## 2024-02-23 DIAGNOSIS — J0100 Acute maxillary sinusitis, unspecified: Secondary | ICD-10-CM | POA: Diagnosis not present

## 2024-02-23 DIAGNOSIS — Z7901 Long term (current) use of anticoagulants: Secondary | ICD-10-CM | POA: Diagnosis not present

## 2024-02-23 DIAGNOSIS — Z79899 Other long term (current) drug therapy: Secondary | ICD-10-CM | POA: Diagnosis not present

## 2024-02-23 DIAGNOSIS — Z7984 Long term (current) use of oral hypoglycemic drugs: Secondary | ICD-10-CM | POA: Insufficient documentation

## 2024-02-23 DIAGNOSIS — I10 Essential (primary) hypertension: Secondary | ICD-10-CM | POA: Diagnosis not present

## 2024-02-23 DIAGNOSIS — R0602 Shortness of breath: Secondary | ICD-10-CM | POA: Diagnosis present

## 2024-02-23 LAB — BASIC METABOLIC PANEL WITH GFR
Anion gap: 11 (ref 5–15)
BUN: 17 mg/dL (ref 8–23)
CO2: 28 mmol/L (ref 22–32)
Calcium: 9.1 mg/dL (ref 8.9–10.3)
Chloride: 104 mmol/L (ref 98–111)
Creatinine, Ser: 0.85 mg/dL (ref 0.44–1.00)
GFR, Estimated: >60 mL/min
Glucose, Bld: 151 mg/dL — ABNORMAL HIGH (ref 70–99)
Potassium: 4.3 mmol/L (ref 3.5–5.1)
Sodium: 143 mmol/L (ref 135–145)

## 2024-02-23 LAB — TROPONIN T, HIGH SENSITIVITY
Troponin T High Sensitivity: <15 ng/L (ref 0–19)
Troponin T High Sensitivity: <15 ng/L (ref 0–19)

## 2024-02-23 LAB — RESP PANEL BY RT-PCR (RSV, FLU A&B, COVID)  RVPGX2
Influenza A by PCR: NEGATIVE
Influenza B by PCR: NEGATIVE
Resp Syncytial Virus by PCR: NEGATIVE
SARS Coronavirus 2 by RT PCR: NEGATIVE

## 2024-02-23 LAB — CBC
HCT: 41.2 % (ref 36.0–46.0)
Hemoglobin: 13 g/dL (ref 12.0–15.0)
MCH: 28.9 pg (ref 26.0–34.0)
MCHC: 31.6 g/dL (ref 30.0–36.0)
MCV: 91.6 fL (ref 80.0–100.0)
Platelets: 332 K/uL (ref 150–400)
RBC: 4.5 MIL/uL (ref 3.87–5.11)
RDW: 13.8 % (ref 11.5–15.5)
WBC: 8 K/uL (ref 4.0–10.5)
nRBC: 0 % (ref 0.0–0.2)

## 2024-02-23 MED ORDER — OXYMETAZOLINE HCL 0.05 % NA SOLN
1.0000 | Freq: Once | NASAL | Status: AC
  Administered 2024-02-23: 1 via NASAL
  Filled 2024-02-23: qty 30

## 2024-02-23 MED ORDER — AMOXICILLIN-POT CLAVULANATE 875-125 MG PO TABS
1.0000 | ORAL_TABLET | Freq: Once | ORAL | Status: DC
  Filled 2024-02-23: qty 1

## 2024-02-23 MED ORDER — AMOXICILLIN-POT CLAVULANATE 875-125 MG PO TABS
1.0000 | ORAL_TABLET | Freq: Two times a day (BID) | ORAL | 0 refills | Status: AC
  Filled 2024-02-23: qty 14, 7d supply, fill #0

## 2024-02-23 NOTE — ED Provider Notes (Signed)
 " Sherry [name] [facility]MENT AT [facility] Provider Note   CSN: [id number] Arrival date & time: 02/23/24  9093     Patient presents with: Shortness of Breath   Sherry [name] is a 74 y.o. female.   Pt is a 74 yo female with pmhx significant for DM, HTN, GERD, HLD, DDD, CVA, hx cerebral aneurysm, depression, anxiety, and migraines.  Pt still works watching babies at night.  1 of the babies was sick a few weeks ago, but he got better.  She still has sinus drainage and sob.  Last night, she had some CP.  She denies f/c.  Her left nare is draining green mucous.         Prior to Admission medications  Medication Sig Start Date End Date Taking? Authorizing Provider  amoxicillin -clavulanate (AUGMENTIN ) 875-125 MG tablet Take 1 tablet by mouth every 12 (twelve) hours. 02/23/24  Yes Dean Clarity, MD  Accu-Chek Softclix Lancets lancets Use to check blood sugars once daily. 11/01/23   Copland, Harlene BROCKS, MD  albuterol  (VENTOLIN  HFA) 108 (90 Base) MCG/ACT inhaler Inhale 2 puffs into the lungs every 6 (six) hours as needed for wheezing or shortness of breath. 04/28/20   Copland, Harlene BROCKS, MD  Blood Glucose Monitoring Suppl (ACCU-CHEK GUIDE) w/Device KIT Use to monitor glucose daily. 11/01/23   Copland, Jessica C, MD  Blood Glucose Monitoring Suppl (ONE TOUCH ULTRA 2) w/Device KIT Use to check glucose up to twice daily. E11.9 dx code 10/29/19   Copland, Harlene BROCKS, MD  cetirizine  (ZYRTEC ) 10 MG tablet Take 1 tablet (10 mg total) by mouth daily. 11/14/23   Copland, Jessica C, MD  Cholecalciferol (VITAMIN D ) 50 MCG (2000 UT) tablet Take 2,000 Units by mouth daily.    [provider]  clopidogrel  (PLAVIX ) 75 MG tablet Take 1 tablet (75 mg total) by mouth daily. 11/14/23   Copland, Harlene BROCKS, MD  empagliflozin  (JARDIANCE ) 10 MG TABS tablet Take 1 tablet (10 mg total) by mouth daily. 12/05/23   Copland, Harlene BROCKS, MD  fluticasone  (FLONASE ) 50 MCG/ACT nasal spray Place 2 sprays  into both nostrils daily. 12/28/21   Copland, Harlene BROCKS, MD  Fluticasone  Furoate (ARNUITY ELLIPTA ) 200 MCG/ACT AEPB Inhale 1 puff (200 mcg) into the lungs daily. 08/18/23   Copland, Harlene BROCKS, MD  furosemide  (LASIX ) 40 MG tablet Take 0.5 tablets (20 mg total) by mouth daily. 12/13/23   Hilty, Vinie BROCKS, MD  gabapentin  (NEURONTIN ) 300 MG capsule Take 2 capsules (600 mg total) by mouth 2 (two) times daily. 12/05/23   Copland, Harlene BROCKS, MD  glucose blood test strip Use to check blood sugars once daily. 10/31/23   Copland, Jessica C, MD  ipratropium (ATROVENT ) 0.03 % nasal spray Place 1 spray into both nostrils in the morning. 08/15/23   Copland, Harlene BROCKS, MD  linagliptin  (TRADJENTA ) 5 MG TABS tablet Take 1 tablet (5 mg total) by mouth daily. 12/05/23   Copland, Harlene BROCKS, MD  lisinopril  (ZESTRIL ) 20 MG tablet Take 1 tablet (20 mg total) by mouth every morning. 02/07/24   Copland, Harlene BROCKS, MD  metFORMIN  (GLUCOPHAGE -XR) 750 MG 24 hr tablet Take 1 tablet (750 mg total) by mouth 2 (two) times daily. 12/05/23   Copland, Harlene BROCKS, MD  metoprolol  tartrate (LOPRESSOR ) 25 MG tablet Take 0.5 tablets (12.5 mg total) by mouth 2 (two) times daily. 12/05/23   Copland, Harlene BROCKS, MD  Multiple Vitamins-Minerals (ADULT GUMMY PO) Take 2 capsules by mouth daily.  [provider]  Spacer/Aero-Holding Raguel FRENCH Use with Flovent  and albuterol  inhalers 03/30/21   Antonio Meth, Yvonne R, DO  Ubrogepant  (UBRELVY ) 50 MG TABS Take 1 tablet (50 mg total) by mouth at onset of migraine. May repeat in 2 hours, if needed. Max dose: 2 tablets/day. This is a 30 day prescription. 12/05/23   Onita Duos, MD    Allergies: Influenza vaccines, Atorvastatin , Demerol, Nsaids, Pravastatin, Prednisone , Rosuvastatin , Roxicodone  [oxycodone ], Tramadol , Covid-19 (mrna) vaccine, Nortriptyline , Codeine, and Mango flavor [flavoring agent (non-screening)]    Review of Systems  HENT:  Positive for sinus pressure and sinus pain.    Respiratory:  Positive for shortness of breath.   Cardiovascular:  Positive for chest pain.  All other systems reviewed and are negative.   Updated Vital Signs BP (!) 140/80   Pulse 65   Temp 97.7 F (36.5 C) (Oral)   Resp 12   Ht 5' 1 (1.549 m)   Wt 90.7 kg   SpO2 96%   BMI 37.79 kg/m   Physical Exam Vitals and nursing note reviewed.  Constitutional:      Appearance: She is well-developed.  HENT:     Head: Normocephalic and atraumatic.     Comments: Left nare with purulent drainage noted    Mouth/Throat:     Mouth: Mucous membranes are moist.     Pharynx: Oropharynx is clear.  Eyes:     Extraocular Movements: Extraocular movements intact.     Pupils: Pupils are equal, round, and reactive to light.  Pulmonary:     Effort: Pulmonary effort is normal.     Breath sounds: Normal breath sounds.  Abdominal:     General: Bowel sounds are normal.     Palpations: Abdomen is soft.  Musculoskeletal:        General: Normal range of motion.     Cervical back: Normal range of motion and neck supple.  Skin:    General: Skin is warm.     Capillary Refill: Capillary refill takes less than 2 seconds.  Neurological:     General: No focal deficit present.     Mental Status: She is alert and oriented to person, place, and time.  Psychiatric:        Mood and Affect: Mood normal.        Behavior: Behavior normal.     (all labs ordered are listed, but only abnormal results are displayed) Labs Reviewed  BASIC METABOLIC PANEL WITH GFR - Abnormal; Notable for the following components:      Result Value   Glucose, Bld 151 (*)    All other components within normal limits  RESP PANEL BY RT-PCR (RSV, FLU A&B, COVID)  [id number]  CBC  TROPONIN T, HIGH SENSITIVITY  TROPONIN T, HIGH SENSITIVITY    EKG: EKG Interpretation Date/Time:  Thursday February 23 2024 09:46:22 EST Ventricular Rate:  63 PR Interval:  149 QRS Duration:  92 QT Interval:  427 QTC Calculation: 438 R  Axis:   74  Text Interpretation: Sinus rhythm Borderline T abnormalities, anterior leads No significant change since last tracing Confirmed by Dean Clarity [phone]) on 02/23/2024 9:52:44 AM  Radiology: [facility] Chest 2 View Result Date: 02/23/2024 EXAM: 2 VIEW(S) XRAY OF THE CHEST 02/23/2024 10:22:00 AM COMPARISON: 10/06/2023 CLINICAL HISTORY: shortness of breath shortness of breath shortness of breath shortness of breath shortness of breath FINDINGS: LUNGS AND PLEURA: No focal pulmonary opacity. No pleural effusion. No pneumothorax. HEART AND MEDIASTINUM: Aortic arch calcifications. No acute abnormality of  the cardiac and mediastinal silhouettes. BONES AND SOFT TISSUES: Reverse right shoulder arthroplasty noted. Left humeral head anchor suture noted. Thoracic spondylosis. IMPRESSION: 1. No acute cardiopulmonary findings. 2. Aortic arch atherosclerotic calcifications. Electronically signed by: Evalene Coho MD 02/23/2024 10:36 AM EST RP Workstation: [id number]     Procedures   Medications Ordered in the ED  amoxicillin -clavulanate (AUGMENTIN ) 875-125 MG per tablet 1 tablet (1 tablet Oral See Procedure Record 02/23/24 1150)  oxymetazoline  (AFRIN) 0.05 % nasal spray 1 spray (1 spray Each Nare Given 02/23/24 1143)                                    Medical Decision Making Amount and/or Complexity of Data Reviewed Labs: ordered. Radiology: ordered.  Risk OTC drugs. Prescription drug management.   This patient presents to the ED for concern of cp/sob, this involves an extensive number of treatment options, and is a complaint that carries with it a high risk of complications and morbidity.  The differential diagnosis includes cardiac, pulm, gi, covid/flu/rsv   Co morbidities that complicate the patient evaluation  DM, HTN, GERD, HLD, DDD, CVA, hx cerebral aneurysm, depression, anxiety, and migraines   Additional history obtained:  Additional history obtained from epic chart  review    Lab Tests:  I Ordered, and personally interpreted labs.  The pertinent results include:  bmp nl other than glucose sl elevated at 151; trop nl; covid/flu/rsv neg; cbc nl   Imaging Studies ordered:  I ordered imaging studies including cxr  I independently visualized and interpreted imaging which showed  No acute cardiopulmonary findings.  2. Aortic arch atherosclerotic calcifications.   I agree with the radiologist interpretation   Cardiac Monitoring:  The patient was maintained on a cardiac monitor.  I personally viewed and interpreted the cardiac monitored which showed an underlying rhythm of: nsr   Medicines ordered and prescription drug management:  I ordered medication including augmentin   for sx  Reevaluation of the patient after these medicines showed that the patient improved I have reviewed the patients home medicines and have made adjustments as needed   Problem List / ED Course:  Sinusitis:  pt tx'd with augmentin .  She is told to use sinus rinses. SOB:  no pna, covid/flu/rsv.  Likely from sinus infection.   CP:  atypical.  Trop times 2 neg.  EKG neg.   Reevaluation:  After the interventions noted above, I reevaluated the patient and found that they have :improved   Social Determinants of Health:  Lives at home   Dispostion:  After consideration of the diagnostic results and the patients response to treatment, I feel that the patent would benefit from discharge with outpatient f/u.       Final diagnoses:  Acute non-recurrent maxillary sinusitis    ED Discharge Orders          Ordered    amoxicillin -clavulanate (AUGMENTIN ) 875-125 MG tablet  Every 12 hours        02/23/24 1121               Dean Clarity, MD 02/23/24 1241  "

## 2024-02-23 NOTE — ED Notes (Signed)

## 2024-02-23 NOTE — ED Notes (Signed)
 Pt was given both meds, Augmentin  would not scan.

## 2024-02-23 NOTE — ED Notes (Signed)
 Pt came in for fatigue and weakness for several weeks. The pt has not been taking any home remedies or medicines other than drinking OJ, and tea. The pt has green mucus production from her nose and throat. Hx of sinus infections. Non productive cough noted as well.   Pt advised she does not like to drink water . Only drinks enough to 'take her medicine and make her coffee'. Will not drink it otherwise.

## 2024-02-23 NOTE — ED Triage Notes (Signed)
 Pt states that she has been sick since January 6th. States that she has  a lot of drainage, congestion and now some shortness of breath. Pt states that her body is aching and she is just overall weak.

## 2024-02-26 NOTE — Progress Notes (Unsigned)
 [facility] [address] [place], [place] [address] [phone]  Date:  03/01/2024   Name:  Sherry [name]   DOB:  1950-12-26   MRN:  [id number]  PCP:  Watt Harlene BROCKS, MD    Chief Complaint: No chief complaint on file.   History of Present Illness:  Sherry [name] is a 74 y.o. very pleasant female patient who presents with the following:  Patient seen today for emergency room follow-up History of diabetes, hypertension, thyroid  goiter, hyperlipidemia, hypothyroidism  She was seen in the ER on January 15 nice infection-she was discharged home with Augmentin  I saw her most recently in September  Tetanus booster due Needs foot exam Eye exam A1c is due Needs urine micro Recommend Shingrix Has declined flu shot  Lab Results  Component Value Date   HGBA1C 7.9 (H) 07/14/2023     Patient Active Problem List   Diagnosis Date Noted   Statin myopathy 10/17/2022   Chronic migraine w/o aura w/o status migrainosus, not intractable 08/18/2022   Trochanteric bursitis, left hip 06/24/2021   S/P total knee arthroplasty, left 03/29/2017   Spondylosis without myelopathy or radiculopathy, cervical region 03/29/2017   Bilateral carpal tunnel syndrome 03/29/2017   Acute asthma exacerbation 08/19/2016   Cervicalgia 03/23/2016   Vertigo 04/14/2015   Anxiety state 01/22/2015   HLD (hyperlipidemia) 01/22/2015   Type 2 diabetes mellitus with circulatory disorder (HCC) 01/22/2015   Cerebral vascular disease 01/22/2015   Aneurysm    Headache    Essential hypertension 11/12/2014   Dependent edema 11/12/2014   Peripheral neuropathy 11/12/2014   Depression 11/12/2014   Facial droop    TIA (transient ischemic attack) 11/11/2014   Hyperthyroidism 08/06/2014   Meniere's disease 06/07/2014   Goiter 06/13/2013   Obesity, unspecified 06/13/2013    Past Medical History:  Diagnosis Date   Allergy 1970   Demerol,  FLu vaccine, NSAIDS, Codiene   Anxiety 2002   Ongoing   Asthma    Cataract    Small ones found by opthamolgist   Cerebral aneurysm    being watched by Dr. Myrtis every 6 months   CHF (congestive heart failure) (HCC)    Resolved   Complication of anesthesia HYPOTENSION INTRAOP W/ HYSTERECTOMY IN 1987--  DID OK W/ TOTAL KNEE IN 2008   COPD (chronic obstructive pulmonary disease) (HCC) 1970   Bronchitits   Depression 1970   Diabetes mellitus    DJD (degenerative joint disease)    Dyslipidemia    GERD (gastroesophageal reflux disease)    H/O hiatal hernia    History of CHF (congestive heart failure) 2010-  RESOLVED   History of peptic ulcer YRS AGO   Hypertension    Meniere's disease    Mitral valve prolapse occasional palpitations w/ chest discomfort   Neuromuscular disorder (HCC) 2002   Peripheral neuropathy   OA (osteoarthritis)    Rotator cuff tear, left    Stroke (HCC)    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    Past Surgical History:  Procedure Laterality Date   BUNIONECTOMY  02/08/2005   LEFT FOOT   FRACTURE SURGERY  07/30/2019   (R) Reverse Shoulder Replacement   IR 3D INDEPENDENT WKST  01/06/2021   IR ANGIO INTRA EXTRACRAN SEL COM CAROTID INNOMINATE BILAT MOD SED  03/22/2019   IR ANGIO INTRA EXTRACRAN SEL COM CAROTID INNOMINATE BILAT MOD SED  01/06/2021   IR ANGIO VERTEBRAL SEL  SUBCLAVIAN INNOMINATE UNI L MOD SED  03/22/2019   IR ANGIO VERTEBRAL SEL VERTEBRAL UNI R MOD SED  03/22/2019   IR ANGIO VERTEBRAL SEL VERTEBRAL UNI R MOD SED  01/06/2021   IR US  GUIDE VASC ACCESS RIGHT  03/22/2019   IR US  GUIDE VASC ACCESS RIGHT  01/06/2021   JOINT REPLACEMENT  2008, 2011,2021   Total knee replacement, '08 (R), '11 (L)   LEFT SHOULDER SURGERY  02/09/1995   ROTATOR CUFF REPAIR   REVERSE SHOULDER ARTHROPLASTY Right 07/30/2020   Procedure: REVERSE SHOULDER ARTHROPLASTY;  Surgeon: Cristy Bonner DASEN, MD;  Location: [facility];  Service: Orthopedics;  Laterality: Right;    SHOULDER ARTHROSCOPY  09/20/2011   Procedure: ARTHROSCOPY SHOULDER;  Surgeon: Reyes JAYSON Billing, MD;  Location: [facility];  Service: Orthopedics;  Laterality: Left;  SAD AND MINI OPEN ROTATOR CUFF REPAIR     TOTAL KNEE ARTHROPLASTY  09-12-2006  [facility]   RIGHT KNEE   TOTAL KNEE ARTHROPLASTY Left 11/30/2017   Procedure: LEFT TOTAL KNEE REPLACEMENT;  Surgeon: Barbarann Oneil JAYSON, MD;  Location: [facility];  Service: Orthopedics;  Laterality: Left;   TUBAL LIGATION  02/08/1974   TYMPANOPLASTY  1990;   1980   VAGINAL HYSTERECTOMY  02/08/1985    Social History[1]  Family History  Problem Relation Age of Onset   Heart disease Mother        in her 23s, heart attack   Diabetes Mother    Kidney disease Mother        dialysis   Thyroid  disease Mother    Aneurysm Mother    Ovarian cancer Mother    Breast cancer Mother    Arthritis Mother    Cancer Mother    Depression Mother    Stroke Mother    Thyroid  disease Sister    Heart disease Sister    Diabetes Sister    Hyperlipidemia Sister    Hypertension Sister    Lymphoma Father    Diabetes Father    Hypertension Father    Congestive Heart Failure Father    Arthritis Father    Cancer Father    Vision loss Father    Thyroid  disease Brother    Hyperlipidemia Brother    Hypertension Brother    Obesity Brother    Thyroid  disease Paternal Grandmother    Arthritis Paternal Grandmother    Heart attack Sister        at age 14   Stroke Sister    Hypertension Sister    Arthritis Sister    Asthma Sister    Cancer Sister    Diabetes Sister    Hearing loss Sister    Hyperlipidemia Sister    Obesity Sister    Vision loss Sister    Breast cancer Maternal Aunt    Breast cancer Maternal Grandmother    Anxiety disorder Maternal Grandmother    Breast cancer Maternal Aunt    Breast cancer Maternal Aunt    COPD Paternal Grandfather    Early death Paternal Grandfather    Other Brother        Set Designer accident   Drug abuse Brother     Bipolar disorder Son    Other Son        GSW   Early death Son    ADD / ADHD Daughter    Diabetes Daughter    Anxiety disorder Daughter    Diabetes Daughter    Obesity Daughter    Early death Brother  Allergies[2]  Medication list has been reviewed and updated.  Medications Ordered Prior to Encounter[3]  Review of Systems:  As per HPI- otherwise negative.   Physical Examination: There were no vitals filed for this visit. There were no vitals filed for this visit. There is no height or weight on file to calculate BMI. Ideal Body Weight:    GEN: no acute distress. HEENT: Atraumatic, Normocephalic.  Ears and Nose: No external deformity. CV: RRR, No M/G/R. No JVD. No thrill. No extra heart sounds. PULM: CTA B, no wheezes, crackles, rhonchi. No retractions. No resp. distress. No accessory muscle use. ABD: S, NT, ND, +BS. No rebound. No HSM. EXTR: No c/c/e PSYCH: Normally interactive. Conversant.    Assessment and Plan: No diagnosis found.  Assessment & Plan   Signed Harlene Schroeder, MD    [1]  Social History Tobacco Use   Smoking status: Never    Passive exposure: Never   Smokeless tobacco: Never  Vaping Use   Vaping status: Never Used  Substance Use Topics   Alcohol use: No   Drug use: No  [2]  Allergies Allergen Reactions   Influenza Vaccines Anaphylaxis   Atorvastatin  Other (See Comments)    Joint pain / myalgias   Demerol Other (See Comments)    SEIZURE   Nsaids Other (See Comments)    NAPROXEN, IBUPROFEN, SKELAXIN---  CAUSES PALPITATIONS   Pravastatin Other (See Comments)    Myalgias    Prednisone  Other (See Comments)    Caused her glucose to go up to 500 and went to ED   Rosuvastatin  Other (See Comments)    Joint stiffness and muscle pain   Roxicodone  [Oxycodone ] Itching and Other (See Comments)    Hallucinations and itching   Tramadol  Other (See Comments)    Contraindicated with Victoza     Covid-19 (Mrna) Vaccine     Patient had  anaphylactic reaction to flu vaccine   Nortriptyline  Other (See Comments)    Severe depression   Codeine Itching   Mango Flavor [Flavoring Agent (Non-Screening)] Itching  [3]  Current Outpatient Medications on File Prior to Visit  Medication Sig Dispense Refill   Accu-Chek Softclix Lancets lancets Use to check blood sugars once daily. 100 each 12   albuterol  (VENTOLIN  HFA) 108 (90 Base) MCG/ACT inhaler Inhale 2 puffs into the lungs every 6 (six) hours as needed for wheezing or shortness of breath. 18 g 2   amoxicillin -clavulanate (AUGMENTIN ) 875-125 MG tablet Take 1 tablet by mouth every 12 (twelve) hours. 14 tablet 0   Blood Glucose Monitoring Suppl (ACCU-CHEK GUIDE) w/Device KIT Use to monitor glucose daily. 1 kit 0   Blood Glucose Monitoring Suppl (ONE TOUCH ULTRA 2) w/Device KIT Use to check glucose up to twice daily. E11.9 dx code 1 kit 0   cetirizine  (ZYRTEC ) 10 MG tablet Take 1 tablet (10 mg total) by mouth daily. 90 tablet 3   Cholecalciferol (VITAMIN D ) 50 MCG (2000 UT) tablet Take 2,000 Units by mouth daily.     clopidogrel  (PLAVIX ) 75 MG tablet Take 1 tablet (75 mg total) by mouth daily. 90 tablet 3   empagliflozin  (JARDIANCE ) 10 MG TABS tablet Take 1 tablet (10 mg total) by mouth daily. 90 tablet 3   fluticasone  (FLONASE ) 50 MCG/ACT nasal spray Place 2 sprays into both nostrils daily. 16 g 6   Fluticasone  Furoate (ARNUITY ELLIPTA ) 200 MCG/ACT AEPB Inhale 1 puff (200 mcg) into the lungs daily. 90 each 3   furosemide  (LASIX ) 40 MG tablet Take 0.5  tablets (20 mg total) by mouth daily.     gabapentin  (NEURONTIN ) 300 MG capsule Take 2 capsules (600 mg total) by mouth 2 (two) times daily. 360 capsule 3   glucose blood test strip Use to check blood sugars once daily. 100 each 12   ipratropium (ATROVENT ) 0.03 % nasal spray Place 1 spray into both nostrils in the morning. 90 mL 0   linagliptin  (TRADJENTA ) 5 MG TABS tablet Take 1 tablet (5 mg total) by mouth daily. 90 tablet 3   lisinopril   (ZESTRIL ) 20 MG tablet Take 1 tablet (20 mg total) by mouth every morning. 90 tablet 0   metFORMIN  (GLUCOPHAGE -XR) 750 MG 24 hr tablet Take 1 tablet (750 mg total) by mouth 2 (two) times daily. 180 tablet 0   metoprolol  tartrate (LOPRESSOR ) 25 MG tablet Take 0.5 tablets (12.5 mg total) by mouth 2 (two) times daily. 90 tablet 0   Multiple Vitamins-Minerals (ADULT GUMMY PO) Take 2 capsules by mouth daily.     Spacer/Aero-Holding Raguel FRENCH Use with Flovent  and albuterol  inhalers 1 each 0   Ubrogepant  (UBRELVY ) 50 MG TABS Take 1 tablet (50 mg total) by mouth at onset of migraine. May repeat in 2 hours, if needed. Max dose: 2 tablets/day. This is a 30 day prescription. 12 tablet 11   No current facility-administered medications on file prior to visit.   "

## 2024-02-28 ENCOUNTER — Other Ambulatory Visit: Payer: Self-pay

## 2024-03-01 ENCOUNTER — Other Ambulatory Visit (HOSPITAL_BASED_OUTPATIENT_CLINIC_OR_DEPARTMENT_OTHER): Payer: Self-pay

## 2024-03-01 ENCOUNTER — Other Ambulatory Visit: Payer: Self-pay | Admitting: Family Medicine

## 2024-03-01 ENCOUNTER — Ambulatory Visit (HOSPITAL_BASED_OUTPATIENT_CLINIC_OR_DEPARTMENT_OTHER)
Admission: RE | Admit: 2024-03-01 | Discharge: 2024-03-01 | Disposition: A | Discharge status: Home / Self Care | Source: Ambulatory Visit | Attending: Family Medicine | Admitting: Family Medicine

## 2024-03-01 ENCOUNTER — Ambulatory Visit: Admitting: Family Medicine

## 2024-03-01 ENCOUNTER — Encounter: Payer: Self-pay | Admitting: Family Medicine

## 2024-03-01 VITALS — BP 120/70 | HR 92 | Ht 61.0 in | Wt 196.6 lb

## 2024-03-01 DIAGNOSIS — Z7984 Long term (current) use of oral hypoglycemic drugs: Secondary | ICD-10-CM | POA: Diagnosis not present

## 2024-03-01 DIAGNOSIS — R0602 Shortness of breath: Secondary | ICD-10-CM | POA: Insufficient documentation

## 2024-03-01 DIAGNOSIS — I10 Essential (primary) hypertension: Secondary | ICD-10-CM | POA: Diagnosis not present

## 2024-03-01 DIAGNOSIS — E1159 Type 2 diabetes mellitus with other circulatory complications: Secondary | ICD-10-CM

## 2024-03-01 DIAGNOSIS — J4541 Moderate persistent asthma with (acute) exacerbation: Secondary | ICD-10-CM | POA: Diagnosis not present

## 2024-03-01 LAB — MICROALBUMIN / CREATININE URINE RATIO
Creatinine,U: 129 mg/dL
Microalb Creat Ratio: 8.6 mg/g (ref 0.0–30.0)
Microalb, Ur: 1.1 mg/dL (ref 0.7–1.9)

## 2024-03-01 LAB — D-DIMER, QUANTITATIVE: D-Dimer, Quant: 0.95 ug{FEU}/mL — ABNORMAL HIGH

## 2024-03-01 LAB — HEMOGLOBIN A1C: Hgb A1c MFr Bld: 8.8 % — ABNORMAL HIGH (ref 4.6–6.5)

## 2024-03-01 MED ORDER — PREDNISONE 20 MG PO TABS
20.0000 mg | ORAL_TABLET | Freq: Every day | ORAL | 0 refills | Status: AC
  Filled 2024-03-01: qty 5, 5d supply, fill #0

## 2024-03-01 MED ORDER — IOHEXOL 350 MG/ML SOLN
75.0000 mL | Freq: Once | INTRAVENOUS | Status: AC | PRN
  Administered 2024-03-01: 75 mL via INTRAVENOUS

## 2024-03-01 MED ORDER — ALBUTEROL SULFATE HFA 108 (90 BASE) MCG/ACT IN AERS
1.0000 | INHALATION_SPRAY | Freq: Four times a day (QID) | RESPIRATORY_TRACT | 6 refills | Status: AC | PRN
  Filled 2024-03-01: qty 6.7, 25d supply, fill #0

## 2024-03-01 NOTE — Patient Instructions (Signed)
 Good to see you today- I will be in touch with your labs Take 20 mg of prednisone  daily for 5 days; if your blood sugars are going over 300 please let me know!   If your D dimer test is elevated I will set up a CT scan of your lungs as well

## 2024-03-05 ENCOUNTER — Other Ambulatory Visit: Payer: Self-pay | Admitting: Family Medicine

## 2024-03-05 DIAGNOSIS — I10 Essential (primary) hypertension: Secondary | ICD-10-CM

## 2024-03-06 ENCOUNTER — Other Ambulatory Visit: Payer: Self-pay

## 2024-03-06 ENCOUNTER — Other Ambulatory Visit (HOSPITAL_COMMUNITY): Payer: Self-pay

## 2024-03-06 MED ORDER — METFORMIN HCL ER 750 MG PO TB24
750.0000 mg | ORAL_TABLET | Freq: Two times a day (BID) | ORAL | 0 refills | Status: AC
  Filled 2024-03-06: qty 180, 90d supply, fill #0

## 2024-03-06 MED ORDER — METOPROLOL TARTRATE 25 MG PO TABS
12.5000 mg | ORAL_TABLET | Freq: Two times a day (BID) | ORAL | 0 refills | Status: AC
  Filled 2024-03-06: qty 90, 90d supply, fill #0

## 2024-03-07 ENCOUNTER — Other Ambulatory Visit: Payer: Self-pay

## 2024-03-14 ENCOUNTER — Ambulatory Visit

## 2024-04-09 ENCOUNTER — Encounter: Admitting: Family Medicine
